# Patient Record
Sex: Female | Born: 1965
Health system: Southern US, Community
[De-identification: ages and names within clinical notes are randomized; demographics above are authoritative.]

## PROBLEM LIST (undated history)

## (undated) DIAGNOSIS — K5790 Diverticulosis of intestine, part unspecified, without perforation or abscess without bleeding: Secondary | ICD-10-CM

## (undated) DIAGNOSIS — K7460 Unspecified cirrhosis of liver: Secondary | ICD-10-CM

## (undated) DIAGNOSIS — Z9889 Other specified postprocedural states: Secondary | ICD-10-CM

## (undated) DIAGNOSIS — I700 Atherosclerosis of aorta: Secondary | ICD-10-CM

## (undated) DIAGNOSIS — G5602 Carpal tunnel syndrome, left upper limb: Secondary | ICD-10-CM

## (undated) DIAGNOSIS — D649 Anemia, unspecified: Secondary | ICD-10-CM

## (undated) DIAGNOSIS — E854 Organ-limited amyloidosis: Secondary | ICD-10-CM

## (undated) DIAGNOSIS — I209 Angina pectoris, unspecified: Secondary | ICD-10-CM

## (undated) DIAGNOSIS — K7581 Nonalcoholic steatohepatitis (NASH): Secondary | ICD-10-CM

## (undated) DIAGNOSIS — G629 Polyneuropathy, unspecified: Secondary | ICD-10-CM

## (undated) DIAGNOSIS — N3289 Other specified disorders of bladder: Secondary | ICD-10-CM

## (undated) DIAGNOSIS — N63 Unspecified lump in unspecified breast: Secondary | ICD-10-CM

## (undated) DIAGNOSIS — K859 Acute pancreatitis without necrosis or infection, unspecified: Secondary | ICD-10-CM

## (undated) DIAGNOSIS — N2889 Other specified disorders of kidney and ureter: Secondary | ICD-10-CM

## (undated) DIAGNOSIS — K769 Liver disease, unspecified: Secondary | ICD-10-CM

## (undated) DIAGNOSIS — Z789 Other specified health status: Secondary | ICD-10-CM

## (undated) DIAGNOSIS — R51 Headache: Secondary | ICD-10-CM

## (undated) DIAGNOSIS — K209 Esophagitis, unspecified without bleeding: Secondary | ICD-10-CM

## (undated) DIAGNOSIS — K8591 Acute pancreatitis with uninfected necrosis, unspecified: Secondary | ICD-10-CM

## (undated) DIAGNOSIS — R112 Nausea with vomiting, unspecified: Secondary | ICD-10-CM

## (undated) DIAGNOSIS — F32A Depression, unspecified: Secondary | ICD-10-CM

## (undated) DIAGNOSIS — I639 Cerebral infarction, unspecified: Secondary | ICD-10-CM

## (undated) DIAGNOSIS — N189 Chronic kidney disease, unspecified: Secondary | ICD-10-CM

## (undated) DIAGNOSIS — I519 Heart disease, unspecified: Secondary | ICD-10-CM

## (undated) DIAGNOSIS — G5600 Carpal tunnel syndrome, unspecified upper limb: Secondary | ICD-10-CM

## (undated) DIAGNOSIS — E079 Disorder of thyroid, unspecified: Secondary | ICD-10-CM

## (undated) DIAGNOSIS — A4902 Methicillin resistant Staphylococcus aureus infection, unspecified site: Secondary | ICD-10-CM

## (undated) DIAGNOSIS — E782 Mixed hyperlipidemia: Secondary | ICD-10-CM

## (undated) DIAGNOSIS — K861 Other chronic pancreatitis: Secondary | ICD-10-CM

## (undated) DIAGNOSIS — E119 Type 2 diabetes mellitus without complications: Secondary | ICD-10-CM

## (undated) DIAGNOSIS — I10 Essential (primary) hypertension: Secondary | ICD-10-CM

## (undated) DIAGNOSIS — S83221A Peripheral tear of medial meniscus, current injury, right knee, initial encounter: Secondary | ICD-10-CM

## (undated) DIAGNOSIS — K219 Gastro-esophageal reflux disease without esophagitis: Secondary | ICD-10-CM

## (undated) DIAGNOSIS — K589 Irritable bowel syndrome without diarrhea: Secondary | ICD-10-CM

## (undated) DIAGNOSIS — D66 Hereditary factor VIII deficiency: Secondary | ICD-10-CM

## (undated) DIAGNOSIS — G43909 Migraine, unspecified, not intractable, without status migrainosus: Secondary | ICD-10-CM

## (undated) DIAGNOSIS — E042 Nontoxic multinodular goiter: Secondary | ICD-10-CM

## (undated) DIAGNOSIS — C539 Malignant neoplasm of cervix uteri, unspecified: Secondary | ICD-10-CM

## (undated) DIAGNOSIS — D689 Coagulation defect, unspecified: Secondary | ICD-10-CM

## (undated) DIAGNOSIS — Z5189 Encounter for other specified aftercare: Secondary | ICD-10-CM

## (undated) DIAGNOSIS — F319 Bipolar disorder, unspecified: Secondary | ICD-10-CM

## (undated) DIAGNOSIS — R519 Headache, unspecified: Secondary | ICD-10-CM

## (undated) DIAGNOSIS — F329 Major depressive disorder, single episode, unspecified: Secondary | ICD-10-CM

## (undated) DIAGNOSIS — T7840XA Allergy, unspecified, initial encounter: Secondary | ICD-10-CM

## (undated) DIAGNOSIS — F419 Anxiety disorder, unspecified: Secondary | ICD-10-CM

## (undated) DIAGNOSIS — E271 Primary adrenocortical insufficiency: Secondary | ICD-10-CM

## (undated) DIAGNOSIS — N281 Cyst of kidney, acquired: Secondary | ICD-10-CM

## (undated) DIAGNOSIS — R0600 Dyspnea, unspecified: Secondary | ICD-10-CM

## (undated) HISTORY — PX: CYSTECTOMY: SUR359

## (undated) HISTORY — PX: BLADDER SURGERY: SHX569

## (undated) HISTORY — PX: LIVER BIOPSY: SHX301

## (undated) HISTORY — PX: SMALL BOWEL REPAIR: SHX6447

## (undated) HISTORY — DX: Encounter for other specified aftercare: Z51.89

## (undated) HISTORY — DX: Allergy, unspecified, initial encounter: T78.40XA

## (undated) HISTORY — DX: Anemia, unspecified: D64.9

## (undated) HISTORY — PX: ABDOMINAL HYSTERECTOMY: SHX81

## (undated) HISTORY — PX: CHOLECYSTECTOMY: SHX55

## (undated) HISTORY — PX: TONSILLECTOMY: SUR1361

## (undated) HISTORY — PX: BOWEL RESECTION: SHX1257

## (undated) HISTORY — DX: Hereditary factor VIII deficiency: D66

## (undated) HISTORY — PX: ERCP: SHX60

## (undated) HISTORY — DX: Depression, unspecified: F32.A

## (undated) HISTORY — PX: CARDIAC CATHETERIZATION: SHX172

## (undated) HISTORY — DX: Malignant neoplasm of cervix uteri, unspecified: C53.9

## (undated) HISTORY — PX: DILATATION & CURETTAGE/HYSTEROSCOPY WITH MYOSURE: SHX6511

## (undated) HISTORY — DX: Primary adrenocortical insufficiency: E27.1

## (undated) HISTORY — PX: DILATION AND CURETTAGE OF UTERUS: SHX78

## (undated) HISTORY — PX: HERNIA REPAIR: SHX51

## (undated) HISTORY — DX: Heart disease, unspecified: I51.9

## (undated) HISTORY — PX: ESOPHAGOGASTRODUODENOSCOPY: SHX1529

## (undated) HISTORY — DX: Major depressive disorder, single episode, unspecified: F32.9

## (undated) HISTORY — DX: Anxiety disorder, unspecified: F41.9

## (undated) HISTORY — PX: APPENDECTOMY: SHX54

## (undated) HISTORY — DX: Coagulation defect, unspecified: D68.9

## (undated) HISTORY — PX: COLON SURGERY: SHX602

## (undated) HISTORY — DX: Disorder of thyroid, unspecified: E07.9

---

## 1987-02-15 HISTORY — PX: BREAST BIOPSY: SHX20

## 1990-02-14 HISTORY — PX: KNEE ARTHROSCOPY: SHX127

## 1990-02-14 HISTORY — PX: CYST REMOVAL HAND: SHX6279

## 1997-02-14 DIAGNOSIS — Z5189 Encounter for other specified aftercare: Secondary | ICD-10-CM

## 1997-02-14 HISTORY — DX: Encounter for other specified aftercare: Z51.89

## 2000-02-15 DIAGNOSIS — C539 Malignant neoplasm of cervix uteri, unspecified: Secondary | ICD-10-CM

## 2000-02-15 HISTORY — DX: Malignant neoplasm of cervix uteri, unspecified: C53.9

## 2004-02-15 HISTORY — PX: PANCREAS SURGERY: SHX731

## 2010-02-14 HISTORY — PX: REPAIR OF ESOPHAGUS: SHX6062

## 2012-02-15 HISTORY — PX: ERCP: SHX60

## 2013-09-19 LAB — COMPREHENSIVE METABOLIC PANEL
Albumin: 3.5 g/dL (ref 3.4–5.0)
Alkaline Phosphatase: 150 U/L — ABNORMAL HIGH
Anion Gap: 11 (ref 7–16)
BUN: 10 mg/dL (ref 7–18)
Bilirubin,Total: 4.5 mg/dL — ABNORMAL HIGH (ref 0.2–1.0)
Calcium, Total: 7.7 mg/dL — ABNORMAL LOW (ref 8.5–10.1)
Chloride: 101 mmol/L (ref 98–107)
Co2: 27 mmol/L (ref 21–32)
Creatinine: 0.82 mg/dL (ref 0.60–1.30)
EGFR (African American): >60
EGFR (Non-African Amer.): >60
Glucose: 242 mg/dL — ABNORMAL HIGH (ref 65–99)
Osmolality: 285 (ref 275–301)
Potassium: 3.3 mmol/L — ABNORMAL LOW (ref 3.5–5.1)
SGOT(AST): 63 U/L — ABNORMAL HIGH (ref 15–37)
SGPT (ALT): 133 U/L — ABNORMAL HIGH
Sodium: 139 mmol/L (ref 136–145)
Total Protein: 6.3 g/dL — ABNORMAL LOW (ref 6.4–8.2)

## 2013-09-19 LAB — URINALYSIS, COMPLETE
Bacteria: NONE SEEN
Bilirubin,UR: NEGATIVE
Blood: NEGATIVE
Glucose,UR: 50 mg/dL (ref 0–75)
Ketone: NEGATIVE
Leukocyte Esterase: NEGATIVE
Nitrite: NEGATIVE
Ph: 6 (ref 4.5–8.0)
Protein: NEGATIVE
RBC,UR: <1 /HPF (ref 0–5)
Specific Gravity: 1.009 (ref 1.003–1.030)
Squamous Epithelial: <1
WBC UR: 1 /HPF (ref 0–5)

## 2013-09-19 LAB — CBC
HCT: 35.3 % (ref 35.0–47.0)
HGB: 12.6 g/dL (ref 12.0–16.0)
MCH: 31.9 pg (ref 26.0–34.0)
MCHC: 35.8 g/dL (ref 32.0–36.0)
MCV: 89 fL (ref 80–100)
Platelet: 116 10*3/uL — ABNORMAL LOW (ref 150–440)
RBC: 3.95 10*6/uL (ref 3.80–5.20)
RDW: 13.1 % (ref 11.5–14.5)
WBC: 5.2 10*3/uL (ref 3.6–11.0)

## 2013-09-19 LAB — LIPASE, BLOOD: Lipase: 58 U/L — ABNORMAL LOW (ref 73–393)

## 2013-09-19 LAB — TROPONIN I: Troponin-I: <0.02 ng/mL

## 2013-09-20 ENCOUNTER — Inpatient Hospital Stay: Payer: Self-pay | Admitting: Internal Medicine

## 2013-09-20 LAB — HEPATIC FUNCTION PANEL A (ARMC)
Albumin: 3.6 g/dL (ref 3.4–5.0)
Alkaline Phosphatase: 149 U/L — ABNORMAL HIGH
Bilirubin, Direct: 0.5 mg/dL — ABNORMAL HIGH (ref 0.00–0.20)
Bilirubin,Total: 5.8 mg/dL — ABNORMAL HIGH (ref 0.2–1.0)
SGOT(AST): 59 U/L — ABNORMAL HIGH (ref 15–37)
SGPT (ALT): 114 U/L — ABNORMAL HIGH
Total Protein: 6.2 g/dL — ABNORMAL LOW (ref 6.4–8.2)

## 2013-09-20 LAB — CBC WITH DIFFERENTIAL/PLATELET
Basophil #: 0 10*3/uL (ref 0.0–0.1)
Basophil %: 0.4 %
Eosinophil #: 0 10*3/uL (ref 0.0–0.7)
Eosinophil %: 0.2 %
HCT: 33.9 % — ABNORMAL LOW (ref 35.0–47.0)
HGB: 12.1 g/dL (ref 12.0–16.0)
Lymphocyte #: 1.8 10*3/uL (ref 1.0–3.6)
Lymphocyte %: 32.7 %
MCH: 32 pg (ref 26.0–34.0)
MCHC: 35.7 g/dL (ref 32.0–36.0)
MCV: 90 fL (ref 80–100)
Monocyte #: 0.3 x10 3/mm (ref 0.2–0.9)
Monocyte %: 5.7 %
Neutrophil #: 3.4 10*3/uL (ref 1.4–6.5)
Neutrophil %: 61 %
Platelet: 108 10*3/uL — ABNORMAL LOW (ref 150–440)
RBC: 3.78 10*6/uL — ABNORMAL LOW (ref 3.80–5.20)
RDW: 12.9 % (ref 11.5–14.5)
WBC: 5.7 10*3/uL (ref 3.6–11.0)

## 2013-09-20 LAB — BASIC METABOLIC PANEL
Anion Gap: 7 (ref 7–16)
BUN: 7 mg/dL (ref 7–18)
Calcium, Total: 6.6 mg/dL — CL (ref 8.5–10.1)
Chloride: 104 mmol/L (ref 98–107)
Co2: 27 mmol/L (ref 21–32)
Creatinine: 0.79 mg/dL (ref 0.60–1.30)
EGFR (African American): >60
EGFR (Non-African Amer.): >60
Glucose: 221 mg/dL — ABNORMAL HIGH (ref 65–99)
Osmolality: 280 (ref 275–301)
Potassium: 2.9 mmol/L — ABNORMAL LOW (ref 3.5–5.1)
Sodium: 138 mmol/L (ref 136–145)

## 2013-09-20 LAB — MAGNESIUM: Magnesium: 1 mg/dL — ABNORMAL LOW

## 2013-09-20 LAB — AMMONIA: Ammonia, Plasma: 50 mcmol/L — ABNORMAL HIGH (ref 11–32)

## 2013-09-20 LAB — PROTIME-INR
INR: 1.2
Prothrombin Time: 15.1 secs — ABNORMAL HIGH (ref 11.5–14.7)

## 2013-09-20 LAB — HEMOGLOBIN A1C: Hemoglobin A1C: 7.4 % — ABNORMAL HIGH (ref 4.2–6.3)

## 2013-09-21 LAB — COMPREHENSIVE METABOLIC PANEL
Albumin: 3.2 g/dL — ABNORMAL LOW (ref 3.4–5.0)
Alkaline Phosphatase: 141 U/L — ABNORMAL HIGH
Anion Gap: 5 — ABNORMAL LOW (ref 7–16)
BUN: 4 mg/dL — ABNORMAL LOW (ref 7–18)
Bilirubin,Total: 5.6 mg/dL — ABNORMAL HIGH (ref 0.2–1.0)
Calcium, Total: 7.2 mg/dL — ABNORMAL LOW (ref 8.5–10.1)
Chloride: 103 mmol/L (ref 98–107)
Co2: 30 mmol/L (ref 21–32)
Creatinine: 0.74 mg/dL (ref 0.60–1.30)
EGFR (African American): >60
EGFR (Non-African Amer.): >60
Glucose: 148 mg/dL — ABNORMAL HIGH (ref 65–99)
Osmolality: 275 (ref 275–301)
Potassium: 3.4 mmol/L — ABNORMAL LOW (ref 3.5–5.1)
SGOT(AST): 55 U/L — ABNORMAL HIGH (ref 15–37)
SGPT (ALT): 101 U/L — ABNORMAL HIGH
Sodium: 138 mmol/L (ref 136–145)
Total Protein: 5.7 g/dL — ABNORMAL LOW (ref 6.4–8.2)

## 2013-09-21 LAB — CBC WITH DIFFERENTIAL/PLATELET
Basophil #: 0 10*3/uL (ref 0.0–0.1)
Basophil %: 0.3 %
Eosinophil #: 0.1 10*3/uL (ref 0.0–0.7)
Eosinophil %: 1.2 %
HCT: 32.7 % — ABNORMAL LOW (ref 35.0–47.0)
HGB: 11.6 g/dL — ABNORMAL LOW (ref 12.0–16.0)
Lymphocyte #: 2 10*3/uL (ref 1.0–3.6)
Lymphocyte %: 37.3 %
MCH: 31.6 pg (ref 26.0–34.0)
MCHC: 35.5 g/dL (ref 32.0–36.0)
MCV: 89 fL (ref 80–100)
Monocyte #: 0.3 x10 3/mm (ref 0.2–0.9)
Monocyte %: 5.2 %
Neutrophil #: 3 10*3/uL (ref 1.4–6.5)
Neutrophil %: 56 %
Platelet: 104 10*3/uL — ABNORMAL LOW (ref 150–440)
RBC: 3.67 10*6/uL — ABNORMAL LOW (ref 3.80–5.20)
RDW: 13.2 % (ref 11.5–14.5)
WBC: 5.4 10*3/uL (ref 3.6–11.0)

## 2013-09-21 LAB — URINALYSIS, COMPLETE
Bacteria: NONE SEEN
Bilirubin,UR: NEGATIVE
Blood: NEGATIVE
Glucose,UR: NEGATIVE mg/dL (ref 0–75)
Leukocyte Esterase: NEGATIVE
Nitrite: NEGATIVE
Ph: 6 (ref 4.5–8.0)
Protein: NEGATIVE
RBC,UR: NONE SEEN /HPF (ref 0–5)
Specific Gravity: 1.009 (ref 1.003–1.030)
Squamous Epithelial: <1
WBC UR: 1 /HPF (ref 0–5)

## 2013-09-21 LAB — LIPASE, BLOOD: Lipase: 34 U/L — ABNORMAL LOW (ref 73–393)

## 2013-09-21 LAB — MAGNESIUM: Magnesium: 2 mg/dL

## 2013-09-22 LAB — CBC WITH DIFFERENTIAL/PLATELET
Basophil #: 0 10*3/uL (ref 0.0–0.1)
Basophil %: 0.6 %
Eosinophil #: 0.1 10*3/uL (ref 0.0–0.7)
Eosinophil %: 1.4 %
HCT: 34.3 % — ABNORMAL LOW (ref 35.0–47.0)
HGB: 12 g/dL (ref 12.0–16.0)
Lymphocyte #: 1.7 10*3/uL (ref 1.0–3.6)
Lymphocyte %: 38.2 %
MCH: 31.7 pg (ref 26.0–34.0)
MCHC: 35 g/dL (ref 32.0–36.0)
MCV: 91 fL (ref 80–100)
Monocyte #: 0.3 x10 3/mm (ref 0.2–0.9)
Monocyte %: 5.8 %
Neutrophil #: 2.4 10*3/uL (ref 1.4–6.5)
Neutrophil %: 54 %
Platelet: 111 10*3/uL — ABNORMAL LOW (ref 150–440)
RBC: 3.79 10*6/uL — ABNORMAL LOW (ref 3.80–5.20)
RDW: 13.1 % (ref 11.5–14.5)
WBC: 4.4 10*3/uL (ref 3.6–11.0)

## 2013-09-22 LAB — COMPREHENSIVE METABOLIC PANEL
Albumin: 3.3 g/dL — ABNORMAL LOW (ref 3.4–5.0)
Alkaline Phosphatase: 166 U/L — ABNORMAL HIGH
Anion Gap: 8 (ref 7–16)
BUN: 4 mg/dL — ABNORMAL LOW (ref 7–18)
Bilirubin,Total: 5.2 mg/dL — ABNORMAL HIGH (ref 0.2–1.0)
Calcium, Total: 7.9 mg/dL — ABNORMAL LOW (ref 8.5–10.1)
Chloride: 105 mmol/L (ref 98–107)
Co2: 26 mmol/L (ref 21–32)
Creatinine: 0.76 mg/dL (ref 0.60–1.30)
EGFR (African American): >60
EGFR (Non-African Amer.): >60
Glucose: 198 mg/dL — ABNORMAL HIGH (ref 65–99)
Osmolality: 280 (ref 275–301)
Potassium: 4.5 mmol/L (ref 3.5–5.1)
SGOT(AST): 238 U/L — ABNORMAL HIGH (ref 15–37)
SGPT (ALT): 209 U/L — ABNORMAL HIGH
Sodium: 139 mmol/L (ref 136–145)
Total Protein: 5.7 g/dL — ABNORMAL LOW (ref 6.4–8.2)

## 2013-09-22 LAB — LIPASE, BLOOD: Lipase: 33 U/L — ABNORMAL LOW (ref 73–393)

## 2013-09-23 LAB — HEPATIC FUNCTION PANEL A (ARMC)
Albumin: 3.2 g/dL — ABNORMAL LOW (ref 3.4–5.0)
Alkaline Phosphatase: 173 U/L — ABNORMAL HIGH
Bilirubin, Direct: 0.6 mg/dL — ABNORMAL HIGH (ref 0.00–0.20)
Bilirubin,Total: 6.4 mg/dL — ABNORMAL HIGH (ref 0.2–1.0)
SGOT(AST): 130 U/L — ABNORMAL HIGH (ref 15–37)
SGPT (ALT): 208 U/L — ABNORMAL HIGH
Total Protein: 6.2 g/dL — ABNORMAL LOW (ref 6.4–8.2)

## 2013-09-23 LAB — CBC WITH DIFFERENTIAL/PLATELET
Basophil #: 0 10*3/uL (ref 0.0–0.1)
Basophil %: 0.3 %
Eosinophil #: 0.1 10*3/uL (ref 0.0–0.7)
Eosinophil %: 1.2 %
HCT: 37.4 % (ref 35.0–47.0)
HGB: 13.2 g/dL (ref 12.0–16.0)
Lymphocyte #: 1.8 10*3/uL (ref 1.0–3.6)
Lymphocyte %: 34.2 %
MCH: 31.9 pg (ref 26.0–34.0)
MCHC: 35.1 g/dL (ref 32.0–36.0)
MCV: 91 fL (ref 80–100)
Monocyte #: 0.3 x10 3/mm (ref 0.2–0.9)
Monocyte %: 5.5 %
Neutrophil #: 3 10*3/uL (ref 1.4–6.5)
Neutrophil %: 58.8 %
Platelet: 110 10*3/uL — ABNORMAL LOW (ref 150–440)
RBC: 4.12 10*6/uL (ref 3.80–5.20)
RDW: 13.1 % (ref 11.5–14.5)
WBC: 5.1 10*3/uL (ref 3.6–11.0)

## 2013-09-23 LAB — BASIC METABOLIC PANEL
Anion Gap: 8 (ref 7–16)
BUN: 1 mg/dL — ABNORMAL LOW (ref 7–18)
Calcium, Total: 7.8 mg/dL — ABNORMAL LOW (ref 8.5–10.1)
Chloride: 99 mmol/L (ref 98–107)
Co2: 33 mmol/L — ABNORMAL HIGH (ref 21–32)
Creatinine: 0.84 mg/dL (ref 0.60–1.30)
EGFR (African American): >60
EGFR (Non-African Amer.): >60
Glucose: 157 mg/dL — ABNORMAL HIGH (ref 65–99)
Osmolality: 278 (ref 275–301)
Potassium: 3.2 mmol/L — ABNORMAL LOW (ref 3.5–5.1)
Sodium: 140 mmol/L (ref 136–145)

## 2013-09-23 LAB — PROTIME-INR
INR: 1.2
Prothrombin Time: 14.8 secs — ABNORMAL HIGH (ref 11.5–14.7)

## 2013-09-24 LAB — BASIC METABOLIC PANEL
Anion Gap: 7 (ref 7–16)
BUN: 1 mg/dL — ABNORMAL LOW (ref 7–18)
Calcium, Total: 7.7 mg/dL — ABNORMAL LOW (ref 8.5–10.1)
Chloride: 96 mmol/L — ABNORMAL LOW (ref 98–107)
Co2: 35 mmol/L — ABNORMAL HIGH (ref 21–32)
Creatinine: 0.76 mg/dL (ref 0.60–1.30)
EGFR (African American): >60
EGFR (Non-African Amer.): >60
Glucose: 215 mg/dL — ABNORMAL HIGH (ref 65–99)
Osmolality: 278 (ref 275–301)
Potassium: 2.9 mmol/L — ABNORMAL LOW (ref 3.5–5.1)
Sodium: 138 mmol/L (ref 136–145)

## 2013-09-24 LAB — CBC WITH DIFFERENTIAL/PLATELET
Basophil #: 0 10*3/uL (ref 0.0–0.1)
Basophil %: 0.4 %
Eosinophil #: 0.1 10*3/uL (ref 0.0–0.7)
Eosinophil %: 1.2 %
HCT: 37.2 % (ref 35.0–47.0)
HGB: 13.1 g/dL (ref 12.0–16.0)
Lymphocyte #: 1.4 10*3/uL (ref 1.0–3.6)
Lymphocyte %: 31.3 %
MCH: 31.6 pg (ref 26.0–34.0)
MCHC: 35.1 g/dL (ref 32.0–36.0)
MCV: 90 fL (ref 80–100)
Monocyte #: 0.3 x10 3/mm (ref 0.2–0.9)
Monocyte %: 7 %
Neutrophil #: 2.7 10*3/uL (ref 1.4–6.5)
Neutrophil %: 60.1 %
Platelet: 107 10*3/uL — ABNORMAL LOW (ref 150–440)
RBC: 4.14 10*6/uL (ref 3.80–5.20)
RDW: 13.1 % (ref 11.5–14.5)
WBC: 4.4 10*3/uL (ref 3.6–11.0)

## 2013-09-24 LAB — HEPATIC FUNCTION PANEL A (ARMC)
Albumin: 3.2 g/dL — ABNORMAL LOW (ref 3.4–5.0)
Alkaline Phosphatase: 155 U/L — ABNORMAL HIGH
Bilirubin, Direct: 0.4 mg/dL — ABNORMAL HIGH (ref 0.00–0.20)
Bilirubin,Total: 5.7 mg/dL — ABNORMAL HIGH (ref 0.2–1.0)
SGOT(AST): 49 U/L — ABNORMAL HIGH (ref 15–37)
SGPT (ALT): 133 U/L — ABNORMAL HIGH
Total Protein: 6 g/dL — ABNORMAL LOW (ref 6.4–8.2)

## 2013-09-24 LAB — PROTIME-INR
INR: 1.1
Prothrombin Time: 14.3 secs (ref 11.5–14.7)

## 2013-09-25 HISTORY — PX: ESOPHAGOGASTRODUODENOSCOPY: SHX1529

## 2013-09-25 LAB — BASIC METABOLIC PANEL
Anion Gap: 9 (ref 7–16)
BUN: 2 mg/dL — ABNORMAL LOW (ref 7–18)
Calcium, Total: 7.8 mg/dL — ABNORMAL LOW (ref 8.5–10.1)
Chloride: 99 mmol/L (ref 98–107)
Co2: 31 mmol/L (ref 21–32)
Creatinine: 0.82 mg/dL (ref 0.60–1.30)
EGFR (African American): >60
EGFR (Non-African Amer.): >60
Glucose: 162 mg/dL — ABNORMAL HIGH (ref 65–99)
Osmolality: 277 (ref 275–301)
Potassium: 3.4 mmol/L — ABNORMAL LOW (ref 3.5–5.1)
Sodium: 139 mmol/L (ref 136–145)

## 2013-09-25 LAB — HEPATIC FUNCTION PANEL A (ARMC)
Albumin: 3 g/dL — ABNORMAL LOW (ref 3.4–5.0)
Alkaline Phosphatase: 145 U/L — ABNORMAL HIGH
Bilirubin, Direct: 0.5 mg/dL — ABNORMAL HIGH (ref 0.00–0.20)
Bilirubin,Total: 5.7 mg/dL — ABNORMAL HIGH (ref 0.2–1.0)
SGOT(AST): 56 U/L — ABNORMAL HIGH (ref 15–37)
SGPT (ALT): 105 U/L — ABNORMAL HIGH
Total Protein: 6 g/dL — ABNORMAL LOW (ref 6.4–8.2)

## 2013-09-25 LAB — CBC WITH DIFFERENTIAL/PLATELET
Basophil #: 0 10*3/uL (ref 0.0–0.1)
Basophil %: 0.3 %
Eosinophil #: 0.1 10*3/uL (ref 0.0–0.7)
Eosinophil %: 1.2 %
HCT: 37.1 % (ref 35.0–47.0)
HGB: 12.7 g/dL (ref 12.0–16.0)
Lymphocyte #: 2 10*3/uL (ref 1.0–3.6)
Lymphocyte %: 41.8 %
MCH: 31 pg (ref 26.0–34.0)
MCHC: 34.2 g/dL (ref 32.0–36.0)
MCV: 91 fL (ref 80–100)
Monocyte #: 0.4 x10 3/mm (ref 0.2–0.9)
Monocyte %: 7.8 %
Neutrophil #: 2.3 10*3/uL (ref 1.4–6.5)
Neutrophil %: 48.9 %
Platelet: 110 10*3/uL — ABNORMAL LOW (ref 150–440)
RBC: 4.1 10*6/uL (ref 3.80–5.20)
RDW: 12.9 % (ref 11.5–14.5)
WBC: 4.8 10*3/uL (ref 3.6–11.0)

## 2013-09-26 LAB — CBC WITH DIFFERENTIAL/PLATELET
Basophil #: 0 10*3/uL (ref 0.0–0.1)
Basophil %: 0.5 %
Eosinophil #: 0.1 10*3/uL (ref 0.0–0.7)
Eosinophil %: 1.5 %
HCT: 45.5 % (ref 35.0–47.0)
HGB: 15.3 g/dL (ref 12.0–16.0)
Lymphocyte #: 3.8 10*3/uL — ABNORMAL HIGH (ref 1.0–3.6)
Lymphocyte %: 43.9 %
MCH: 30.9 pg (ref 26.0–34.0)
MCHC: 33.7 g/dL (ref 32.0–36.0)
MCV: 92 fL (ref 80–100)
Monocyte #: 0.6 x10 3/mm (ref 0.2–0.9)
Monocyte %: 6.6 %
Neutrophil #: 4.1 10*3/uL (ref 1.4–6.5)
Neutrophil %: 47.5 %
Platelet: 226 10*3/uL (ref 150–440)
RBC: 4.96 10*6/uL (ref 3.80–5.20)
RDW: 13 % (ref 11.5–14.5)
WBC: 8.7 10*3/uL (ref 3.6–11.0)

## 2013-09-27 LAB — URINALYSIS, COMPLETE
Bilirubin,UR: NEGATIVE
Glucose,UR: NEGATIVE mg/dL (ref 0–75)
Hyaline Cast: 5
Ketone: NEGATIVE
Nitrite: NEGATIVE
Ph: 5 (ref 4.5–8.0)
Protein: NEGATIVE
RBC,UR: 1 /HPF (ref 0–5)
Specific Gravity: 1.011 (ref 1.003–1.030)
Squamous Epithelial: 4
WBC UR: 1 /HPF (ref 0–5)

## 2013-11-28 ENCOUNTER — Ambulatory Visit: Payer: Self-pay | Admitting: Gastroenterology

## 2013-11-28 LAB — HEPATIC FUNCTION PANEL A (ARMC)
Albumin: 3.9 g/dL (ref 3.4–5.0)
Alkaline Phosphatase: 123 U/L — ABNORMAL HIGH
Bilirubin, Direct: 0.2 mg/dL (ref 0.00–0.20)
Bilirubin,Total: 5.5 mg/dL — ABNORMAL HIGH (ref 0.2–1.0)
SGOT(AST): 33 U/L (ref 15–37)
SGPT (ALT): 46 U/L
Total Protein: 7.1 g/dL (ref 6.4–8.2)

## 2013-11-28 LAB — CBC WITH DIFFERENTIAL/PLATELET
Basophil #: 0 10*3/uL (ref 0.0–0.1)
Basophil %: 0.4 %
Eosinophil #: 0 10*3/uL (ref 0.0–0.7)
Eosinophil %: 0.7 %
HCT: 39.5 % (ref 35.0–47.0)
HGB: 12.9 g/dL (ref 12.0–16.0)
Lymphocyte #: 2.1 10*3/uL (ref 1.0–3.6)
Lymphocyte %: 41.1 %
MCH: 29.6 pg (ref 26.0–34.0)
MCHC: 32.7 g/dL (ref 32.0–36.0)
MCV: 90 fL (ref 80–100)
Monocyte #: 0.2 x10 3/mm (ref 0.2–0.9)
Monocyte %: 4.1 %
Neutrophil #: 2.8 10*3/uL (ref 1.4–6.5)
Neutrophil %: 53.7 %
Platelet: 147 10*3/uL — ABNORMAL LOW (ref 150–440)
RBC: 4.37 10*6/uL (ref 3.80–5.20)
RDW: 13.2 % (ref 11.5–14.5)
WBC: 5.2 10*3/uL (ref 3.6–11.0)

## 2013-11-28 LAB — PROTIME-INR
INR: 1.1
Prothrombin Time: 13.8 secs (ref 11.5–14.7)

## 2013-11-28 LAB — AMMONIA: Ammonia, Plasma: 34 mcmol/L — ABNORMAL HIGH (ref 11–32)

## 2013-12-03 ENCOUNTER — Observation Stay: Payer: Self-pay | Admitting: Internal Medicine

## 2013-12-03 LAB — COMPREHENSIVE METABOLIC PANEL
Albumin: 3.5 g/dL (ref 3.4–5.0)
Alkaline Phosphatase: 117 U/L — ABNORMAL HIGH
Anion Gap: 10 (ref 7–16)
BUN: 11 mg/dL (ref 7–18)
Bilirubin,Total: 5 mg/dL — ABNORMAL HIGH (ref 0.2–1.0)
Calcium, Total: 7.6 mg/dL — ABNORMAL LOW (ref 8.5–10.1)
Chloride: 105 mmol/L (ref 98–107)
Co2: 27 mmol/L (ref 21–32)
Creatinine: 0.85 mg/dL (ref 0.60–1.30)
EGFR (African American): >60
EGFR (Non-African Amer.): >60
Glucose: 296 mg/dL — ABNORMAL HIGH (ref 65–99)
Osmolality: 293 (ref 275–301)
Potassium: 3.5 mmol/L (ref 3.5–5.1)
SGOT(AST): 31 U/L (ref 15–37)
SGPT (ALT): 43 U/L
Sodium: 142 mmol/L (ref 136–145)
Total Protein: 6.3 g/dL — ABNORMAL LOW (ref 6.4–8.2)

## 2013-12-03 LAB — URINALYSIS, COMPLETE
Bilirubin,UR: NEGATIVE
Blood: NEGATIVE
Glucose,UR: >=500 mg/dL (ref 0–75)
Ketone: NEGATIVE
Leukocyte Esterase: NEGATIVE
Nitrite: NEGATIVE
Ph: 6 (ref 4.5–8.0)
Protein: NEGATIVE
RBC,UR: <1 /HPF (ref 0–5)
Specific Gravity: 1.005 (ref 1.003–1.030)
Squamous Epithelial: 1
WBC UR: <1 /HPF (ref 0–5)

## 2013-12-03 LAB — CBC
HCT: 36.4 % (ref 35.0–47.0)
HGB: 12.7 g/dL (ref 12.0–16.0)
MCH: 31 pg (ref 26.0–34.0)
MCHC: 34.8 g/dL (ref 32.0–36.0)
MCV: 89 fL (ref 80–100)
Platelet: 141 10*3/uL — ABNORMAL LOW (ref 150–440)
RBC: 4.09 10*6/uL (ref 3.80–5.20)
RDW: 13.2 % (ref 11.5–14.5)
WBC: 4.5 10*3/uL (ref 3.6–11.0)

## 2013-12-03 LAB — APTT: Activated PTT: 30.2 secs (ref 23.6–35.9)

## 2013-12-03 LAB — TROPONIN I
Troponin-I: <0.02 ng/mL
Troponin-I: <0.02 ng/mL
Troponin-I: <0.02 ng/mL

## 2013-12-03 LAB — PROTIME-INR
INR: 1.1
Prothrombin Time: 14.3 secs (ref 11.5–14.7)

## 2013-12-03 LAB — LIPASE, BLOOD: Lipase: 25 U/L — ABNORMAL LOW (ref 73–393)

## 2013-12-04 LAB — CBC WITH DIFFERENTIAL/PLATELET
Basophil #: 0 10*3/uL (ref 0.0–0.1)
Basophil %: 0.4 %
Eosinophil #: 0 10*3/uL (ref 0.0–0.7)
Eosinophil %: 0.3 %
HCT: 35.8 % (ref 35.0–47.0)
HGB: 12.4 g/dL (ref 12.0–16.0)
Lymphocyte #: 2.3 10*3/uL (ref 1.0–3.6)
Lymphocyte %: 32.9 %
MCH: 30.6 pg (ref 26.0–34.0)
MCHC: 34.7 g/dL (ref 32.0–36.0)
MCV: 88 fL (ref 80–100)
Monocyte #: 0.4 x10 3/mm (ref 0.2–0.9)
Monocyte %: 5.1 %
Neutrophil #: 4.3 10*3/uL (ref 1.4–6.5)
Neutrophil %: 61.3 %
Platelet: 154 10*3/uL (ref 150–440)
RBC: 4.06 10*6/uL (ref 3.80–5.20)
RDW: 13 % (ref 11.5–14.5)
WBC: 7 10*3/uL (ref 3.6–11.0)

## 2013-12-04 LAB — BASIC METABOLIC PANEL
Anion Gap: 8 (ref 7–16)
BUN: 6 mg/dL — ABNORMAL LOW (ref 7–18)
Calcium, Total: 7.3 mg/dL — ABNORMAL LOW (ref 8.5–10.1)
Chloride: 102 mmol/L (ref 98–107)
Co2: 29 mmol/L (ref 21–32)
Creatinine: 0.63 mg/dL (ref 0.60–1.30)
EGFR (African American): >60
EGFR (Non-African Amer.): >60
Glucose: 135 mg/dL — ABNORMAL HIGH (ref 65–99)
Osmolality: 277 (ref 275–301)
Potassium: 3.2 mmol/L — ABNORMAL LOW (ref 3.5–5.1)
Sodium: 139 mmol/L (ref 136–145)

## 2013-12-04 LAB — HEMOGLOBIN A1C: Hemoglobin A1C: 7.2 % — ABNORMAL HIGH (ref 4.2–6.3)

## 2013-12-04 LAB — MAGNESIUM: Magnesium: 1.4 mg/dL — ABNORMAL LOW

## 2013-12-06 LAB — BASIC METABOLIC PANEL
Anion Gap: 6 — ABNORMAL LOW (ref 7–16)
BUN: 12 mg/dL (ref 7–18)
Calcium, Total: 7.2 mg/dL — ABNORMAL LOW (ref 8.5–10.1)
Chloride: 101 mmol/L (ref 98–107)
Co2: 32 mmol/L (ref 21–32)
Creatinine: 0.8 mg/dL (ref 0.60–1.30)
EGFR (African American): >60
EGFR (Non-African Amer.): >60
Glucose: 265 mg/dL — ABNORMAL HIGH (ref 65–99)
Osmolality: 287 (ref 275–301)
Potassium: 3.3 mmol/L — ABNORMAL LOW (ref 3.5–5.1)
Sodium: 139 mmol/L (ref 136–145)

## 2013-12-06 LAB — MAGNESIUM: Magnesium: 2.8 mg/dL — ABNORMAL HIGH

## 2014-01-31 ENCOUNTER — Emergency Department: Payer: Self-pay | Admitting: Emergency Medicine

## 2014-01-31 LAB — COMPREHENSIVE METABOLIC PANEL
Albumin: 4 g/dL (ref 3.4–5.0)
Alkaline Phosphatase: 151 U/L — ABNORMAL HIGH
Anion Gap: 11 (ref 7–16)
BUN: 10 mg/dL (ref 7–18)
Bilirubin,Total: 7.5 mg/dL — ABNORMAL HIGH (ref 0.2–1.0)
Calcium, Total: 7.8 mg/dL — ABNORMAL LOW (ref 8.5–10.1)
Chloride: 102 mmol/L (ref 98–107)
Co2: 25 mmol/L (ref 21–32)
Creatinine: 0.72 mg/dL (ref 0.60–1.30)
EGFR (African American): >60
EGFR (Non-African Amer.): >60
Glucose: 276 mg/dL — ABNORMAL HIGH (ref 65–99)
Osmolality: 285 (ref 275–301)
Potassium: 2.8 mmol/L — ABNORMAL LOW (ref 3.5–5.1)
SGOT(AST): 32 U/L (ref 15–37)
SGPT (ALT): 58 U/L
Sodium: 138 mmol/L (ref 136–145)
Total Protein: 6.9 g/dL (ref 6.4–8.2)

## 2014-01-31 LAB — URINALYSIS, COMPLETE
Bilirubin,UR: NEGATIVE
Blood: NEGATIVE
Glucose,UR: >=500 mg/dL (ref 0–75)
Hyaline Cast: 5
Leukocyte Esterase: NEGATIVE
Nitrite: NEGATIVE
Ph: 5 (ref 4.5–8.0)
Protein: NEGATIVE
RBC,UR: 1 /HPF (ref 0–5)
Specific Gravity: 1.018 (ref 1.003–1.030)
Squamous Epithelial: 3
WBC UR: 4 /HPF (ref 0–5)

## 2014-01-31 LAB — CBC WITH DIFFERENTIAL/PLATELET
Basophil #: 0 10*3/uL (ref 0.0–0.1)
Basophil %: 0.4 %
Eosinophil #: 0.1 10*3/uL (ref 0.0–0.7)
Eosinophil %: 0.6 %
HCT: 39.5 % (ref 35.0–47.0)
HGB: 13.8 g/dL (ref 12.0–16.0)
Lymphocyte #: 3.5 10*3/uL (ref 1.0–3.6)
Lymphocyte %: 33.7 %
MCH: 30.2 pg (ref 26.0–34.0)
MCHC: 34.9 g/dL (ref 32.0–36.0)
MCV: 87 fL (ref 80–100)
Monocyte #: 0.5 x10 3/mm (ref 0.2–0.9)
Monocyte %: 4.5 %
Neutrophil #: 6.2 10*3/uL (ref 1.4–6.5)
Neutrophil %: 60.8 %
Platelet: 180 10*3/uL (ref 150–440)
RBC: 4.56 10*6/uL (ref 3.80–5.20)
RDW: 13.3 % (ref 11.5–14.5)
WBC: 10.3 10*3/uL (ref 3.6–11.0)

## 2014-01-31 LAB — LIPASE, BLOOD: Lipase: 58 U/L — ABNORMAL LOW (ref 73–393)

## 2014-02-10 ENCOUNTER — Ambulatory Visit: Payer: Self-pay | Admitting: Gastroenterology

## 2014-04-20 DIAGNOSIS — Z794 Long term (current) use of insulin: Secondary | ICD-10-CM | POA: Insufficient documentation

## 2014-06-07 NOTE — Consult Note (Signed)
PATIENT NAME:  Amber Christensen, Amber Christensen MR#:  [id number] DATE OF BIRTH:  [birth year]  DATE OF CONSULTATION:  12/03/2013  REFERRING PHYSICIAN:  Dr. Bridgett Larsson CONSULTING PHYSICIAN:  Gaylyn Cheers, MD/Pammie Chirino A. Jerelene Redden, ANP (Adult Nurse Practitioner)  REASON FOR CONSULTATION: Abdominal pain.   HISTORY OF PRESENT ILLNESS: This 49 year old patient presented to the Emergency Room today with acute on chronic abdominal pain. The patient reports last night she developed diffuse mild abdominal pain generalized in the abdomen. During the day today, the pain escalated and she had her first episode of vomiting in the Emergency Room. She has had vomiting several times since. She has required Zofran and Phenergan, as well as Dilaudid. She continues to have problems with diffuse abdominal pain and vomiting. She reports these symptoms are identical to what she has experienced in the past. The patient has noted no hematemesis, coffee-ground emesis or melena. Bowel habits are variable from formed to loose, which is her baseline. The patient cannot recall any specific triggers that elicited this current event. She says she has been taking her Protonix daily. She is no longer on Carafate. She reports she is taking her Creon as directed with meals for known pancreatic insufficiency. The patient denies any NSAID use.   This patient has a complex medical history with recurrent hospitalizations for abdominal pain, nausea, and vomiting. She developed an ileus in August, did require NG tube drainage. An upper endoscopy was performed and showed LA grade C erosive esophagitis. She has had elevated liver enzymes in the past with elevated bilirubin thought to be in part to Gilbert's. She has seen Dr. Monica Martinez at [facility] and is deemed at this point not to be a transplant candidate. There has been no evidence of cirrhosis. Her prior liver biopsies from [place] have been requested for a review. The patient does have a history of severe fatty liver,  fatty replaced pancreas, as well as pancreatitis with multiple ERCPs. She has had pancreas resection. I refer you to previous admission notes. She has also had numerous liver studies obtained and at this point, there has been no evidence of active hepatitis B or hepatitis C. The patient has had numerous abdominal surgeries.   Admitting laboratory studies revealed a lipase of 25, alkaline phosphatase 117, ALT 43, total bilirubin 5.0, which seems to be her baseline, normal troponins, and platelet count 141,000, and INR 1.1. Dr. Gustavo Lah is following her as an outpatient and saw her recently last week. The patient says she was scheduled for an MRI or a MRCP this weekend.   PAST MEDICAL HISTORY: 1.  Recurrent nonalcoholic pancreatitis, started at age 76.  2.  History of partial pancreatic resection in 2014.  3.  Insulin-dependent diabetes mellitus.  4.  Hypertension.  5.  History of cervical cancer.  6.  Depression.  7.  Has history of hepatitis.  8.  Hypertension.  9.  NASH, nonalcoholic steatohepatitis.  10.   Irritable bowel syndrome.  11.   Bipolar disorder.    PAST SURGICAL HISTORY: 1.  Partial pancreectomy 2014.  2.  Left upper cholecystectomy.  3.  Appendectomy.  4.  Hysterectomy, vaginal.  5.  Tonsillectomy.  6.  Bladder repair.  7.  Multiple liver biopsies.  8.  Multiple cystoscopies.  9.  Cardiac catheterization.  10.   D and C.  11.   Bowel repair.  12.   Adhesion removals.   FAMILY HISTORY: Positive for colon cancer in mother, cirrhosis in father, cirrhosis in paternal grandmother.   ALLERGIES:  CYCLOBENZAPRINE, DEMEROL HCL, LEVAQUIN, MAGNESIUM OXIDE, MORPHINE, ZOFRAN, PROPOXYPHENE, SOMA.   HOME MEDICATIONS: 1.  Protonix 40 mg daily.  2.  Oxycodone 5 mg every 6 hours as needed.  3.  Zofran 4 mg every 8 hours as needed.  4.  Meclizine 12.5 mg 3 times daily.  5.  Lisinopril 10 mg daily.  6.  Lantus 6 units subcutaneously daily at bedtime. 7.  Humalog 3 times daily as  needed according to sliding scale.  8.  Creon 36,000 units 2 capsules 3 times daily.  9.  Amlodipine 5 mg once daily.   REVIEW OF SYSTEMS: Positive for some fatigue, feels bad all over, nausea, vomiting, abdominal pain as noted. The patient denies remaining 10 systems.   PHYSICAL EXAMINATION: VITAL SIGNS: 98, 75, 18, 160/93. Pulse oximetry on room air is 100%.  GENERAL: Caucasian female with jaundice, resting in bed. The patient is conversive. She then begins having vomiting and vomits a moderate amount of gray-green liquid.  HEENT: Head is normocephalic. Conjunctivae is injected. Positive scleral icterus. Oral mucosa is dry and intact.  NECK: Supple. Trachea midline.  CARDIAC: S1 and S2 without murmur, rub or gallop.  LUNGS: CTA. Respirations are eupneic.  ABDOMEN: Soft, multiple abdominal scars noted. She has tenderness wherever palpated, mostly on the right side. Nondistended. Bowel sounds are present.  RECTAL: Deferred.  GENITOURINARY: Bladder is nondistended.  EXTREMITIES: Lower extremities without edema, cyanosis, or clubbing. A few bruises are noted.  SKIN: Warm and dry.  NEUROLOGIC: She is alert, oriented. Vague historian.  PSYCHIATRIC: Affect and mood somewhat flat, cooperative.   LABORATORY: Admitting labs with glucose 296, BUN 11, creatinine 0.85, electrolytes unremarkable, lipase 25, albumin 3.5, total bilirubin 5.0, alkaline phosphatase 117, ALT 43, AST 31. Troponin negative x 3. WBC 4.5, hemoglobin 12.7, platelet 141,000, MCV 89. Pro time 14.3, INR 1.1, PTT 30.2. Urinalysis is unremarkable.   RADIOLOGY: CT of the abdomen and pelvis with contrast performed today shows no acute abdominal or pelvic findings. Stable postsurgical changes. There is minimal residual pancreatic tissue. Improved hepatic steatosis from last study. Probable chronic splenic vein thrombosis with splenic varices. Chest x-ray, portable, single view, shows no acute pulmonary findings.   IMPRESSION: This is a  complex patient with history of liver disease and pancreatic disease. She does have abdominal pain, nausea, vomiting consistent with a prior history. Recent EGD in August with LA grade C esophagitis. The patient has risk factors for adhesions again. Most likely explanation for current presentation is adhesions. She is at risk for partial obstruction and ileus. She is at risk for flares of esophagitis and/or gastritis.   PLAN: Continue with proton pump inhibitor. Resume Carafate slurry when the vomiting is better managed. Symptomatic management with daily abdominal films for at least 2 to 3 days. IV fluids and close monitoring of blood sugars as patient is not able to tolerate p.o. liquids at this time. Continue with medication for nausea, vomiting, and pain. Clinical course will need to be monitored closely. No GI luminal evaluation recommended at this time. This case was discussed with Dr. Vira Agar and all labs and films were reviewed. Further GI followup pending.   Thank you for this consultation.   These services provided by Joelene Millin A. Jerelene Redden, MS, APRN, BC, ANP (Adult Nurse Practitioner) under collaborative agreement with Manya Silvas, MD.     ____________________________ Janalyn Harder Jerelene Redden, ANP (Adult Nurse Practitioner) kam:at D: 12/03/2013 17:43:22 ET T: 12/03/2013 18:16:15 ET JOB#: [id number]  cc: Joelene Millin A. Jerelene Redden, ANP (Adult  Nurse Practitioner), <Dictator> Janalyn Harder. Sherlyn Hay, MSN, ANP-BC Adult Nurse Practitioner ELECTRONICALLY SIGNED 12/05/2013 8:17

## 2014-06-07 NOTE — Consult Note (Signed)
Brief Consult Note: Diagnosis: abdominal pain, back pain, nausea, vomiting, colonic distention.   Patient was seen by consultant.   Consult note dictated.   Comments: No confirmatory signs of cirrhosis. Getting the results of her liver Bx might help. She does have a fatty liver, a bili of 5, normal synthetic hepatic function, normal portal vein flow, no ascites, so cirrhosis is unlikely. Her CBD is 7 mm which is a little large. She may have some obstructive component to her jaundice or also have some type of bilirubin metabolism problem. 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 Also, she has marked gaseous distention of her colon on XRay, c/w opioid-induced colonic ileus. She has had 7 mg IV Dilaudid in last 24 hours, and (just prior to a dose) my exam shows mild abdominal distention, and although she expresses that it hurts, no objective signs of tenderness (no rebound, no guarding). Her baseline is diarrhea, with floating stools, c/w inadequately treated pancreatic insufficiency. Perhaps a selective mu receptor antagonist, such as Relistor, while she has an NG and is receiving IV opioids, may help with her colon function.  She does not have a surgical problem at this time.  Her CT scan of the abdomen and pelvis, with IV and oral contrast,showed most of her pancreas was removed with her pancreatic necrosectomy 05/2012, and also shows signs of splenic vein thrombosis (which is born out by her normal PV flow, and her mild thrombocytopenia)..  Electronic Signatures: Consuela Mimes (MD)  (Signed 09-Aug-15 20:38)  Authored: Brief Consult Note   Last Updated: 09-Aug-15 20:38 by Consuela Mimes (MD)

## 2014-06-07 NOTE — Consult Note (Signed)
Pt with hx of multiple complex medical/surgical problems with surgery all done in [place].  Seen 2 months ago by Dr. Gustavo Lah and at [facility] by Dr. Jola Babinski.  She is tender in mid LLQ and has palpable distention.  CT noted.  I wonder if she has partial SBO with adhesions causing her pain. Will follow with you.  Electronic Signatures: Manya Silvas (MD)  (Signed on 20-Oct-15 18:53)  Authored  Last Updated: 20-Oct-15 18:53 by Manya Silvas (MD)

## 2014-06-07 NOTE — Consult Note (Signed)
PATIENT NAME:  Amber Christensen, MURDY MR#:  [id number] DATE OF BIRTH:  [birth year]  DATE OF CONSULTATION:  09/22/2013  REFERRING PHYSICIAN:    CONSULTING PHYSICIAN:  Consuela Mimes, MD  HISTORY OF PRESENT ILLNESS: Amber Christensen is a 49 year old white female who was admitted to the hospital 4 days ago with severe abdominal pain, radiating to her back, associated with nausea, and vomiting. She states that at 1 point, she had a very low-grade fever, which is unusual for her as her baseline temperature is approximately 96. She recently moved from [place], to the area with her husband, as her husband's children from her previous marriage are here. That move was in February of 2015. In April of 2014, while she was living in [place], she had a pancreatic necrosectomy for a post ERCP pancreatitis episode. The patient apparently has had recurrent nonalcoholic pancreatitis since she was in her 45s, and has been treated with pancrelipase for chronic pancreatic exocrine insufficiency since her surgery. She says that baseline, she has diarrhea, and that her stools float in the toilet, and have a foul odor to them, and that she has lost 70 pounds in the last year. Unfortunately, at this time, none of her records from [place], including a liver biopsy, that was performed, are available.   PAST MEDICAL HISTORY: Recurrent nonalcoholic pancreatitis, starting age 33, with pancreatic necrosis, requiring pancreatic necrosectomy April 2014; depression, hypertension, insulin-dependent diabetes mellitus.   PAST SURGICAL HISTORY: Pancreatic necrosectomy, as mentioned above; tonsillectomy, cholecystectomy, hysterectomy, appendectomy, bladder repair; liver biopsy, multiple cystoscopies, cardiac catheterization, D and C.   MEDICATIONS:  1.  Lantus 6 units daily.  2.  Humalog 2 units with dinner.  3.  Hydrochlorothiazide/lisinopril 12.5/20 one daily.   ALLERGIES: CYCLOBENZAPRINE, PROPOXYPHENE, MAGNESIUM OXIDE, SOMA, MORPHINE,  DEMEROL, LEVAQUIN, ONDANSETRON.   SOCIAL HISTORY: The patient is married; and moved to [place], because her husband's 68, and 37 year old children from a previous marriage live here; she is recently employed at [employer]; she does not smoke cigarettes, and does not drink alcohol.   FAMILY HISTORY: Noncontributory.   REVIEW OF SYSTEMS: Negative for 10 systems with the following exceptions; the patient has weakness, abdominal pain, back pain, significant weight loss, nausea, vomiting, possible hematochezia, and recent psychological stressors.   PHYSICAL EXAMINATION: VITAL SIGNS: Height is not recorded, weight 129 pounds, temperature 98.9, pulse 83, respirations 20, blood pressure 142/88, oxygen saturation 94% at rest, on room air.  GENERAL: Reveals an emotionally distraught patient lying flat in the hospital bed with a nasogastric tube present.  HEENT: Pupils equally round, and reactive to light, extraocular movements intact, sclerae show mild icterus; oropharynx clear, mucous membranes moist, hearing intact to voice.  NECK: Supple with no lymphadenopathy.  HEART: Regular rate and rhythm with no murmurs, or rubs.  LUNGS: Clear to auscultation with normal respiratory effort bilaterally.  ABDOMEN: Mildly distended with a long midline scar, and a very small ventral hernia as part of that. The patient has significant abdominal tympany, but no tenderness despite the fact that she says it hurts when I push; in other words, there is no objective signs of rebound, tenderness, or guarding.  EXTREMITIES: No edema with normal capillary refill bilaterally.  NEUROLOGIC: Cranial nerves II through XII, motor, and sensation grossly intact.  PSYCHIATRIC: The patient appears somewhat anxious, but overall appropriate; given her a condition.   OTHER STUDIES:  1.  Electrolytes normal, magnesium normal, lipase normal; ammonia level 50, albumin 3.3, total bilirubin 5.2, alkaline phosphatase 166;  SGOT 238, SGPT  209, troponin normal; white blood cell count 4, hemoglobin 12, hematocrit 34%, platelet count 111,000,   2.  Urinalysis normal x 2.  3.  CT scan of the abdomen and pelvis with contrast on admission reveals diffuse hepatic steatosis with prior cholecystectomy, no biliary dilatation and patent hepatic, and portal veins; there are small tortuous, enhancing vessels, around the stomach compatible with early gastric varices; the pancreatic body and tail are fatty replaced, but the pancreatic head is unremarkable.  4.  Ultrasound reveals a common bile duct diameter of 7 mm, and a duplex ultrasound of the portal vein reveals it to be patent with normal hepatopetal flow.  5.  Abdominal films from today, revealed multiple dilated loops of colon, consistent with an adynamic colonic ileus.     ASSESSMENT: No confirmatory signs of cirrhosis, despite the fact that that is in her chart. The patient likely has a splenic vein thrombosis leading to left-sided portal hypertension accounting for her mild gastric varices, and her relative thrombocytopenia. She also appears to have an element of inadequately treated pancreatic insufficiency, and currently has received 7 mg of IV Dilaudid in the last 24 hours. This opioid intake may account for her mild jaundice, or she may have a mild obstructive component to her jaundice, either from her previous episodes of pancreatitis, or from sphincter of Oddi spasm from her opioids. She may benefit from a selective mu receptor antagonists, such as Relistor while she has a nasogastric tube, and is continuing to receive IV Dilaudid; this may help with her colon function.   Currently, there are no indications for surgical intervention.   My partner, Dr. Marlyce Huge will assume her surgical consultive care tomorrow.    ____________________________ Consuela Mimes, MD wfm:nt D: 09/22/2013 20:36:18 ET T: 09/22/2013 22:27:56 ET JOB#: [id number]  cc: Consuela Mimes, MD,  <Dictator> Consuela Mimes MD ELECTRONICALLY SIGNED 09/23/2013 4:21

## 2014-06-07 NOTE — H&P (Signed)
PATIENT NAMESHERIECE, Amber Christensen MR#:  [id number] DATE OF BIRTH:  10-Oct-1965  DATE OF ADMISSION:  09/19/2013  ADDENDUM  DIAGNOSTIC DATA: Lab values: Sodium 139, potassium 3.3, chloride 101, bicarb 27, creatinine 0.82, BUN 10, glucose 242, calcium 7.7, lipase 58, total protein 6.3, albumin 3.5, total bilirubin 4.5, alk phos 150, AST 63, ALT 133. Troponin less than 0.02. White blood cell count 5.2, hemoglobin 12.6, hematocrit 35.3, platelets 116,000, MCV 89. Urine is negative for signs of infection.   Imaging: CT of the abdomen and pelvis with contrast shows no acute intra-abdominal or pelvic finding. Extensive fatty liver infiltration. Pancreas body and tail are fatty replaced. Pancreatic head is unremarkable. Incidental left kidney cyst, 11 mm. No bowel obstruction, bowel wall thickening or free air. Postop changes to the bowel in the right lower quadrant. Prior hysterectomy. Prior cholecystectomy. Small tortuous enhancing vessels around the stomach compatible with early gastric varices suggesting portal hypertension.   Abdominal x-ray shows no acute abdominal process, no obstruction.   ASSESSMENT AND PLAN: 1.  Acute on chronic pancreatitis: It will be important to review this patient's records. She has asked her husband to bring them in the morning. For now continue with n.p.o. status, IV fluids, pain control, nausea control. The patient states that she has been advised to always use ciprofloxacin in the setting of pancreatitis. We will initiate ciprofloxacin and await records. CT is reassuring with no signs of edema or inflammation at this time around the pancreas. We will ask for gastroenterology consultation due to complicated history with recurrent pancreatitis, necrotic pancreatitis status post partial pancreatic resection, elevated liver function tests, and possible chronic hepatitis.  2.  Hypertension: Continue lisinopril/hydrochlorothiazide with p.r.n. labetalol.  3.  Insulin-dependent diabetes  mellitus: Will initiate sliding scale insulin and check hemoglobin A1c.  4.  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 We will ask for gastroenterology consultation.  5.  Hypokalemia: Will replete and recheck.  6.  Deep vein thrombosis prophylaxis: Heparin.  TIME SPENT DURING THIS ADMISSION: 40 minutes.  ____________________________ Earleen Newport. Volanda Napoleon, MD cpw:sb D: 09/20/2013 11:25:00 ET T: 09/20/2013 11:59:36 ET JOB#: [id number]  cc: Barnetta Chapel P. Volanda Napoleon, MD, <Dictator> Aldean Jewett MD ELECTRONICALLY SIGNED 09/28/2013 13:57

## 2014-06-07 NOTE — Discharge Summary (Signed)
Dates of Admission and Diagnosis:  Date of Admission 20-Sep-2013   Date of Discharge 27-Sep-2013   Admitting Diagnosis ac on ch pancreatitis   Final Diagnosis Ac on ch pancreatitis. Ch liver cirrhosis Esophagitis Hepatic encephalopathy Uncontrolled Htn    Chief Complaint/History of Present Illness 49 year old female who has just moved to [place] from [place] who has a past medical history of recurrent pancreatitis in her 65s and is status post necrotic pancreatitis and partial pancreatic resection in 2014, presents with abdominal pain and vomiting similar to prior episodes of pancreatitis. She reports that she has been having worsening abdominal pain over the past week, which became acutely worse today. Today, she also developed intractable vomiting, to the point of clear/bilious emesis. She has had no hematemesis. She has not had any diarrhea. She does have normal bowel movements, she feels she may have seen some blood streaks in the most recent bowel movement. She has had no fevers, chills, sweats.   Allergies:  Cyclobenzaprine: Unknown  Propoxyphene: Unknown  Magnesium Oxide: Unknown  Soma: Unknown  Morphine: Unknown  Demerol HCl: Unknown  Levaquin: Unknown  ondansetron: Unknown  Hepatic:  06-Aug-15 17:46   Bilirubin, Total  4.5  Alkaline Phosphatase  150 (46-116 NOTE: New Reference Range 09/03/13)  SGPT (ALT)  133 (14-63 NOTE: New Reference Range 09/03/13)  SGOT (AST)  63  Total Protein, Serum  6.3  Albumin, Serum 3.5  07-Aug-15 20:12   Bilirubin, Total  5.8  Bilirubin, Direct  0.5 (Result(s) reported on 20 Sep 2013 at 09:04PM.)  Alkaline Phosphatase  149 (46-116 NOTE: New Reference Range 09/03/13)  SGPT (ALT)  114 (14-63 NOTE: New Reference Range 09/03/13)  SGOT (AST)  59  Total Protein, Serum  6.2  Albumin, Serum 3.6  09-Aug-15 05:41   Bilirubin, Total  5.2  Alkaline Phosphatase  166 (46-116 NOTE: New Reference Range 09/03/13)  SGPT (ALT)  209  (14-63 NOTE: New Reference Range 09/03/13)  SGOT (AST)  238  Total Protein, Serum  5.7  Albumin, Serum  3.3  12-Aug-15 04:01   Bilirubin, Total  5.7  Bilirubin, Direct  0.5 (Result(s) reported on 25 Sep 2013 at 04:35AM.)  Alkaline Phosphatase  145 (46-116 NOTE: New Reference Range 09/03/13)  SGPT (ALT)  105 (14-63 NOTE: New Reference Range 09/03/13)  SGOT (AST)  56  Total Protein, Serum  6.0  Albumin, Serum  3.0  General Ref:  07-Aug-15 20:12   Mitochondrial (M2) Antibody ========== TEST NAME ==========  ========= RESULTS =========  = REFERENCE RANGE =  MITOCHONDRIAL (M2) AB  Mitochondrial (M2) Antibody Mitochondrial (M2) Antibody     [   9.2 Units            ]          0.0-20.0              Negative    0.0 - 20.0                                                Equivocal  20.1 - 24.9                                                Positive         >  24.9                                                                    .              Mitochondrial (M2) Antibodies are found in 90-96% of                patients with primary biliary cirrhosis.               [facility]            No: [id number]           [address], [place], [place] [address] Lindon Romp, MD         [phone]   Result(s) reported on 23 Sep 2013 at 01:50PM.  Anti-Smooth Muscle Antibody ========== TEST NAME ==========  ========= RESULTS =========  = REFERENCE RANGE =  ANTI-SMOOTH MUSCLE ABS  Actin (Smooth Muscle) Antibody Actin (Smooth Muscle) Antibody  [   12 Units             ]              0-19   Negative                     0 - 19                                 Weak positive               20 - 30                                 Moderate to strong positive     >30                                                                     .              Actin Antibodies are found in 52-85% of patients with                 autoimmune hepatitis or chronic active hepatitis and                  in 22% of patients with primary biliary cirrhosis.               [facility]  No: [id number]           [address], [place], [place] [address]           Lindon Romp, MD         [phone]   Result(s) reported on 23 Sep 2013 at 01:50PM.  ANA Comprehensive Panel ========== TEST NAME ==========  ========= RESULTS =========  = REFERENCE RANGE =  ANA COMPREHENSIVE PANEL  ANA Comprehensive Panel Anti-DNA (DS) Ab Qn             [   <  1 IU/mL             ]               0-9               Negative      <5                                                   Equivocal  5 - 9                                                   Positive      >9 RNP Antibodies                  [   <0.2 AI              ]           0.0-0.9 Smith Antibodies                [   <0.2 AI              ]           0.0-0.9 Antiscleroderma-70 Antibodies   [   <0.2 AI              ]           0.0-0.9 Sjogren's Anti-SS-A             [   <0.2 AI              ]           0.0-0.9 Sjogren's Anti-SS-B          [   <0.2 AI              ]           0.0-0.9 Antichromatin Antibodies        [   <0.2 AI              ]           0.0-0.9 Anti-Jo-1                       [   <0.2 AI              ]           0.0-0.9 Anti-Centromere B Antibodies    [   <0.2 AI              ]           0.0-0.9 See below:                      [   Final Report         ]                   Autoantibody                       Disease Association ------------------------------------------------------------         Condition  Frequency ---------------------   ------------------------   --------- Antinuclear Antibody,    SLE, mixed connective Direct (ANA-D)           tissue diseases ---------------------   ------------------------   --------- dsDNA                    SLE                        40 - 60% ---------------------   ------------------------   --------- Chromatin                Drug induced SLE                90%                           SLE                        48 - 97% ---------------------   ------------------------   --------- SSA (Ro)                 SLE                        25 - 35%                          Sjogren's Syndrome         40 - 70%                          Neonatal Lupus                 100% ---------------------   ------------------------   --------- SSB (La)                 SLE                             10%                          Sjogren's Syndrome              30% ---------------------   -----------------------    --------- Sm (anti-Smith)          SLE                        15 - 30% ---------------------   -----------------------    --------- RNP                      Mixed Connective Tissue                          Disease                         95% (U1 nRNP,          SLE                        30 - 50% anti-ribonucleoprotein)  Polymyositis and/or  Dermatomyositis                 20% ---------------------   ------------------------   --------- Scl-70 (antiDNA          Scleroderma (diffuse)      20 - 35% topoisomerase)           Crest                           13% ---------------------   ------------------------   --------- Jo-1                     Polymyositis and/or                          Dermatomyositis            20 - 40% ---------------------   ------------------------   --------- Centromere B             Scleroderma - Crest                          variant                         80%               [facility]            No: [id number]  [address], [place], [place] [address]           Lindon Romp, MD         [phone]   Result(s) reported on 23 Sep 2013 at 01:50PM.  Hepatitis C Virus Antibody ========== TEST NAME ==========  ========= RESULTS =========  = REFERENCE RANGE =  HCV ANTIBODY  HCV Antibody Hep C Virus Ab                  [   <0.1 s/co ratio      ]           0.0-0.9                                                   Negative:     < 0.8                                             Indeterminate: 0.8 - 0.9                                                  Positive:     > 0.9                                                                      .  In order to reduce the incidence of a false positive                  result, the CDC recommends that all s/co ratios                  between 1.0 and 10.9 be confirmed by a more specific                  supplemental or PCR testing. [facility] offers HCV Ab                 w/Reflex to Verification test [phone].               [facility]            No: [id number]           [address], [place], [place] [address]           Lindon Romp, MD         [phone]   Result(s) reportedon 23 Sep 2013 at 01:50PM.  Hepatitis B Surface Antibody, Qual ========== TEST NAME ==========  ========= RESULTS =========  = REFERENCE RANGE =  HEPATITIS B SURF.AB,QUAL  Hep B Surface Ab Hep B Surface Ab, Qual          [   Non Reactive         ]                                                Non Reactive: Inconsistent with immunity,                                            less than 10 mIU/mL                              Reactive:     Consistent with immunity,                                            greater than 9.9 mIU/mL               [facility]            No: [id number]           [address], [place], [place] [address]           Lindon Romp, MD         [phone]   Result(s) reported on 23 Sep 2013 at 01:50PM.  HBsAg ========== TEST NAME ==========  ========= RESULTS =========  = REFERENCE RANGE =  HEPATITIS B SURFACE AG  HBsAg Screen HBsAg Screen                    [   Negative             ]          Negative               [facility]  No: [id number]           [address] [place], [place] [address]           Lindon Romp, MD         [phone]   Result(s) reported on 23 Sep 2013 at 05:48AM.  Hepatitis B Core Antibody Total ========== TEST NAME ==========  ========= RESULTS =========  = REFERENCE RANGE =  HEPATITIS B CORE AB,TOTL  Hep B Core Ab, Tot Hep B Core Ab, Tot              [   Negative             ]          Negative               [facility]      No: [id number]           [address] [place], [place] [address]           Lindon Romp, MD         [phone]   Result(s) reported on 23 Sep 2013 at 01:50PM.  Hepatitis A Antibody, Total ========== TEST NAME ==========  ========= RESULTS =========  = REFERENCE RANGE =  HEPATITIS A ANTIBDY,TOTL  Hep A Ab, Total Hep A Ab, Total                 [   Negative             ]          Negative               [facility]   No: [id number]           [address] [place], [place] [address]           Lindon Romp, MD         [phone]   Result(s) reported on 23 Sep 2013 at 01:50PM.  Routine Chem:  06-Aug-15 17:46   Glucose, Serum  242  BUN 10  Creatinine (comp) 0.82  Sodium, Serum 139  Potassium, Serum  3.3  Chloride, Serum 101  CO2, Serum 27  Calcium (Total), Serum  7.7  Anion Gap 11  Osmolality (calc) 285  eGFR (African American) >60  eGFR (Non-African American) >60 (eGFR values <12m/min/1.73 m2 may be an indication of chronic kidney disease (CKD). Calculated eGFR is useful in patients with stable renal function. The eGFR calculation will not be reliable in acutely ill patients when serum creatinine is changing rapidly. It is not useful in  patients on dialysis. The eGFR calculation may not be applicable to patients at the low and high extremes of body sizes, pregnant women, and vegetarians.)  Lipase  58 (Result(s) reported on 19 Sep 2013 at 06:05PM.)  07-Aug-15 04:21   Glucose, Serum  221  BUN 7  Creatinine (comp) 0.79  Sodium, Serum 138  Potassium, Serum  2.9  Chloride, Serum 104  CO2, Serum 27  Calcium (Total), Serum  6.6  Anion Gap 7   Osmolality (calc) 280  eGFR (African American) >60  eGFR (Non-African American) >60 (eGFR values <660mmin/1.73 m2 may be an indication of chronic kidney disease (CKD). Calculated eGFR is useful in patients with stable renal function. The eGFR calculation will not be reliable in acutely ill patients when serum creatinine is changing rapidly. It is not useful in  patients on dialysis. The eGFR calculation may not be applicable to  patients at the low and high extremes of body sizes, pregnant women, and vegetarians.)  Magnesium, Serum  1.0 (1.8-2.4 THERAPEUTIC RANGE: 4-7 mg/dL TOXIC: > 10 mg/dL  -----------------------)  Result Comment CALCIUM - RESULTS VERIFIED BY REPEAT TESTING.  - NOTIFIED OF CRITICAL VALUE  - C/ MEGAN WERNER @0514  09-20-13.Marland KitchenAJO  - READ-BACK PROCESS PERFORMED.  Result(s) reported on 20 Sep 2013 at 04:57AM.  Hemoglobin A1c [facility])  7.4 (The American Diabetes Association recommends that a primary goal of therapy should be <7% and that physicians should reevaluate the treatment regimen in patients with HbA1c values consistently >8%.)    20:12   Ammonia, Plasma  50 (Result(s) reported on 20 Sep 2013 at 08:52PM.)  08-Aug-15 04:51   Potassium, Serum  3.4  09-Aug-15 05:41   Glucose, Serum  198  BUN  4  Creatinine (comp) 0.76  Sodium, Serum 139  Potassium, Serum 4.5  Chloride, Serum 105  CO2, Serum 26  Calcium (Total), Serum  7.9  Anion Gap 8  Osmolality (calc) 280  eGFR (African American) >60  eGFR (Non-African American) >60 (eGFR values <77m/min/1.73 m2 may be an indication of chronic kidney disease (CKD). Calculated eGFR is useful in patients with stable renal function. The eGFR calculation will not be reliable in acutely ill patients when serum creatinine is changing rapidly. It is not useful in  patients on dialysis. The eGFR calculation may not be applicable to patients at the low and high extremes of body sizes, pregnant women, and vegetarians.)  Lipase   33 (Result(s) reported on 22 Sep 2013 at 02:53PM.)  10-Aug-15 04:59   Potassium, Serum  3.2  11-Aug-15 07:27   Potassium, Serum  2.9  12-Aug-15 04:01   Glucose, Serum  162  BUN  2  Creatinine (comp) 0.82  Sodium, Serum 139  Potassium, Serum  3.4  Chloride, Serum 99  CO2, Serum 31  Calcium (Total), Serum  7.8  Anion Gap 9  Osmolality (calc) 277  eGFR (African American) >60  eGFR (Non-African American) >60 (eGFR values <626mmin/1.73 m2 may be an indication of chronic kidney disease (CKD). Calculated eGFR is useful in patients with stable renal function. The eGFR calculation will not be reliable in acutely ill patients when serum creatinine is changing rapidly. It is not useful in  patients on dialysis. The eGFR calculation may not be applicable to patients at the low and high extremes of body sizes, pregnant women, and vegetarians.)  Cardiac:  06-Aug-15 17:46   Troponin I < 0.02 (0.00-0.05 0.05 ng/mL or less: NEGATIVE  Repeat testing in 3-6 hrs  if clinically indicated. >0.05 ng/mL: POTENTIAL  MYOCARDIAL INJURY. Repeat  testing in 3-6 hrs if  clinically indicated. NOTE: An increase or decrease  of 30% or more on serial  testing suggests a  clinically important change)  Routine UA:  06-Aug-15 17:46   Color (UA) Yellow  Clarity (UA) Clear  Glucose (UA) 50 mg/dL  Bilirubin (UA) Negative  Ketones (UA) Negative  Specific Gravity (UA) 1.009  Blood (UA) Negative  pH (UA) 6.0  Protein (UA) Negative  Nitrite (UA) Negative  Leukocyte Esterase (UA) Negative (Result(s) reported on 19 Sep 2013 at 07:46PM.)  RBC (UA) <1 /HPF  WBC (UA) 1 /HPF  Bacteria (UA) NONE SEEN  Epithelial Cells (UA) <1 /HPF (Result(s) reported on 19 Sep 2013 at 07:46PM.)  14-Aug-15 05:57   Color (UA) Yellow  Clarity (UA) Clear  Glucose (UA) Negative  Bilirubin (UA) Negative  Ketones (UA) Negative  Specific Gravity (UA) 1.011  Blood (UA) 1+  pH (UA) 5.0  Protein (UA) Negative  Nitrite (UA)  Negative  Leukocyte Esterase (UA) 1+ (Result(s) reported on 27 Sep 2013 at 06:17AM.)  RBC (UA) 1 /HPF  WBC (UA) 1 /HPF  Bacteria (UA) 1+  Epithelial Cells (UA) 4 /HPF  Mucous (UA) PRESENT  Hyaline Cast (UA) 5 /LPF (Result(s) reported on 27 Sep 2013 at 06:17AM.)  Routine Coag:  07-Aug-15 20:12   Prothrombin  15.1  INR 1.2 (INR reference interval applies to patients on anticoagulant therapy. A single INR therapeutic range for coumarins is not optimal for all indications; however, the suggested range for most indications is 2.0 - 3.0. Exceptions to the INR Reference Range may include: Prosthetic heart valves, acute myocardial infarction, prevention of myocardial infarction, and combinations of aspirin and anticoagulant. The need for a higher or lower target INR must be assessed individually. Reference: The Pharmacology and Management of the Vitamin K  antagonists: the seventh ACCP Conference on Antithrombotic and Thrombolytic Therapy. XLKGM.0102 Sept:126 (3suppl): N9146842. A HCT value >55% may artifactually increase the PT.  In one study,  the increase was an average of 25%. Reference:  "Effect on Routine and Special Coagulation Testing Values of Citrate Anticoagulant Adjustment in Patients with High HCT Values." American Journal of Clinical Pathology 2006;126:400-405.)  Routine Hem:  06-Aug-15 17:46   WBC (CBC) 5.2  RBC (CBC) 3.95  Hemoglobin (CBC) 12.6  Hematocrit (CBC) 35.3  Platelet Count (CBC)  116 (Result(s) reported on 19 Sep 2013 at 06:03PM.)  MCV 89  MCH 31.9  MCHC 35.8  RDW 13.1  07-Aug-15 04:21   WBC (CBC) 5.7  RBC (CBC)  3.78  Hemoglobin (CBC) 12.1  Hematocrit (CBC)  33.9  Platelet Count (CBC)  108  MCV 90  MCH 32.0  MCHC 35.7  RDW 12.9  Neutrophil % 61.0  Lymphocyte % 32.7  Monocyte % 5.7  Eosinophil % 0.2  Basophil % 0.4  Neutrophil # 3.4  Lymphocyte # 1.8  Monocyte # 0.3  Eosinophil # 0.0  Basophil # 0.0 (Result(s) reported on 20 Sep 2013 at  04:57AM.)  09-Aug-15 05:41   WBC (CBC) 4.4  RBC (CBC)  3.79  Hemoglobin (CBC) 12.0  Hematocrit (CBC)  34.3  Platelet Count (CBC)  111  MCV 91  MCH 31.7  MCHC 35.0  RDW 13.1  Neutrophil % 54.0  Lymphocyte % 38.2  Monocyte % 5.8  Eosinophil % 1.4  Basophil % 0.6  Neutrophil # 2.4  Lymphocyte # 1.7  Monocyte # 0.3  Eosinophil # 0.1  Basophil # 0.0 (Result(s) reported on 22 Sep 2013 at 05:58AM.)  12-Aug-15 04:01   WBC (CBC) 4.8  RBC (CBC) 4.10  Hemoglobin (CBC) 12.7  Hematocrit (CBC) 37.1  Platelet Count (CBC)  110  MCV 91  MCH 31.0  MCHC 34.2  RDW 12.9  Neutrophil % 48.9  Lymphocyte % 41.8  Monocyte % 7.8  Eosinophil % 1.2  Basophil % 0.3  Neutrophil # 2.3  Lymphocyte # 2.0  Monocyte # 0.4  Eosinophil # 0.1  Basophil # 0.0 (Result(s) reported on 25 Sep 2013 at 04:35AM.)   PERTINENT RADIOLOGY STUDIES: XRay:    08-Aug-15 15:54, Abdomen Flat and Erect  Abdomen Flat and Erect   REASON FOR EXAM:    abdominal pain, h/o chronic pancreatitis  COMMENTS:       PROCEDURE: DXR - DXR ABDOMEN 2 V FLAT AND ERECT  - Sep 21 2013  3:54PM     CLINICAL DATA:  Abdominal pain, chronic pancreatitis    EXAM:  ABDOMEN - 2 VIEW    COMPARISON:  CT abdomen pelvis dated 09/19/2013    FINDINGS:  Nonobstructive bowel gas pattern.  No evidence of free air under the diaphragm on the upright view.    Mild to moderate colonic stool burden.    Cholecystectomy clips.     IMPRESSION:  No evidence of small bowel obstruction or free air.      Electronically Signed    By: Julian Hy M.D.    On: 09/21/2013 16:19       Verified By: Julian Hy, M.D.,    09-Aug-15 14:17, Abdomen Flat and Erect  Abdomen Flat and Erect   REASON FOR EXAM:    abdominal pain, distension, n/v  COMMENTS:       PROCEDURE: DXR - DXR ABDOMEN 2 V FLAT AND ERECT  - Sep 22 2013  2:17PM     CLINICAL DATA:  Abdominal bloating/pain, distention, nausea/vomiting    EXAM:  ABDOMEN - 2  VIEW    COMPARISON:  09/21/2013    FINDINGS:  Mild of bowel gas pattern.  Multiple distended loops of colon.    Surgical sutures in the right mid abdomen.    Cholecystectomy clips.    No evidence of free air under the diaphragm on the upright view.     IMPRESSION:  No evidence of small bowel obstruction or free air.    Multiple dilated loops of colon, suggesting adynamic colonic ileus.      Electronically Signed    By: Julian Hy M.D.    On: 09/22/2013 14:35         Verified By: Julian Hy, M.D.,    11-Aug-15 09:48, KUB - Kidney Ureter Bladder  KUB - Kidney Ureter Bladder   REASON FOR EXAM:    compare- intestinal obstruction.  COMMENTS:       PROCEDURE: DXR - DXR KIDNEY URETER BLADDER  - Sep 24 2013  9:48AM     CLINICAL DATA:  Abdominal pain, small bowel obstruction.    EXAM:  ABDOMEN - 1 VIEW    COMPARISON:  09/22/2013    FINDINGS:  Nasogastric tube has tip over the stomach with side port just above  the gastroesophageal junction as this is unchanged. Surgical sutures  are present over the right mid to lower abdomen. There is less  gaseous distention of the colon. There is a air-filled bowel loops  in the left mid abdomen which may represent a mildly prominent small  bowel loop. No free peritoneal air. No definite obstruction.  Remainder of the exam is unchanged.     IMPRESSION:  Nonspecific, nonobstructive bowel gas pattern with possible upper  normal caliber air-filled small bowel loop in the left mid abdomen.    Nasogastric tube unchanged with tip over the stomach and side-port  just above the gastroesophageal junction.      Electronically Signed    By: Marin Olp M.D.    On: 09/24/2013 10:21         Verified By: Pearletha Alfred, M.D.,    12-Aug-15 07:02, KUB - Kidney Ureter Bladder  KUB - Kidney Ureter Bladder   REASON FOR EXAM:    abdominal pain, pseudoobstruction, please compare   with previous  COMMENTS:       PROCEDURE:  DXR - DXR KIDNEY URETER BLADDER  - Sep 25 2013  7:02AM     CLINICAL DATA:  Abdominal pain, pseudo obstruction, followup  EXAM:  ABDOMEN - 1 VIEW    COMPARISON:  09/24/2013    FINDINGS:  Anastomotic staple line in RIGHT mid abdomen in RIGHT pelvis.  Surgical clips RIGHT upper quadrant likely cholecystectomy.    Scattered gas within large and small bowel loops.    Minimally prominent RIGHT colon.    No evidence of bowel wall thickening or obstruction.    Gas present to rectum.    Bones unremarkable.    No urinary tract calcification.     IMPRESSION:  Nonobstructive bowel gas pattern.    Little interval change.      Electronically Signed    By: Lavonia Dana M.D.    On: 09/25/2013 08:16         Verified By: Burnetta Sabin, M.D.,    14-Aug-15 07:23, KUB - Kidney Ureter Bladder  KUB - Kidney Ureter Bladder   REASON FOR EXAM:    colonic pseudoobstruction, please compare with   previous film.  COMMENTS:       PROCEDURE: DXR - DXR KIDNEY URETER BLADDER  - Sep 27 2013  7:23AM     CLINICAL DATA:  Colonic pseudo-obstruction    EXAM:  ABDOMEN - 1 VIEW    COMPARISON:  September 25, 2013    FINDINGS:  Borderline prominence of the right colon remains without change.  Anastomotic staple lines on the right appear unchanged. The  remainder of the bowel appears unremarkable. There is air in the  rectum. There is moderate stool in the colon. There are surgical  clips in right upper quadrant.     IMPRESSION:  Right colon is borderline prominent but stable. Mild colonic ileus  is possible. No frank obstruction is seen on this supine  examination. No free air. Note that there is air in the rectum. The  appearance is essentially stable compared to recent prior study.      Electronically Signed    By: Lowella Grip M.D.    On: 09/27/2013 08:08     Verified By: Leafy Kindle. Jasmine December, M.D.,  [place]:    09-Aug-15 12:22, US Abdomen Limited Survey  US Abdomen Limited Survey    REASON FOR EXAM:    distenstion. ascitis?  COMMENTS:   Body Site: Ascites search - Abd. Quadrants imaged for free   fluid    PROCEDURE: [place]  - US ABDOMEN LIMITED SURVEY  - Sep 22 2013 12:22PM     CLINICAL DATA:  49 year old female with abdominal distention,  vomiting. Query ascites. Initial encounter.    EXAM:  LIMITED ABDOMEN ULTRASOUND FOR ASCITES    TECHNIQUE:  Limited ultrasound survey for ascites was performed in all four  abdominal quadrants.  COMPARISON:  CT Abdomen and Pelvis 09/19/2013.    FINDINGS:  No ascites identified in the abdomen. The bladder is severely  distended (estimated volume 977 mL).     IMPRESSION:  1. No abdominal ascites.  2. Severe bladder distension, the patient advises the technologist  she has difficulty emptyingher bladder. Consider Foley catheter  placement.      Electronically Signed    By: Lars Pinks M.D.    On: 09/22/2013 13:04         Verified By: Gwenyth Bender. [place] Crane, M.D.,  LabUnknown:    11-Aug-15 09:48, KUB - Kidney Ureter Bladder  PACS Image    Pertinent Past History:  Pertinent Past History 1.  Recurrent non alcoholic pancreatitis starting at age 33, necrotic pancreatitis status post partial pancreatic resection in 2014.  2.  Biliary cirrhosis.  3.  Depression.  4.  Chronic hepatitis.  5.  Hypertension.  6.  Insulin-dependent diabetes mellitus.  7. masses of the breast, kidney and bladder which she thinks could be malignant   Hospital Course:  Hospital Course This pleasant 49 year old woman with a history of recurrent pancreatitis since her 20's who is s/p partial pancreatic resection due to necrotic pancreatitis in 2014 presents with symptoms suggestive of pancreatitis flare.  She has had pain for greater than 1 week, increasing for 24 hrs before admission associated with vomiting.  She has just moved to [place], [place] with her daughter from [place].  She had formerly been followed closely by a gastroenterologist.  * Acute on  chronic pancreatitis Pain meds. IVF. GI following. pancreatic replacement enzymes.  started on protonix bid and sucralfate.  * Esophagitis   EGD done - started on clear liquid diet, upgraded to full liquid- tolerated.   tolerated soft diet- advised pt , to continue soft diet for next few days, and not to eat late in evening- may d/c if OK with GI - after lunch.  * Uncontrolled Hypertension Increase HCTZ-lisinopril to BID.  Added norvasc. better now,.  * Intestinal obstruction- due to pain meds.   Relistor. no BM yet, but Xray shows non specific bowel gas pattern.   Appreciated Sx consult.  * IDDM: Sliding Scale Insulin, A1C 7.4  * Hypokalemia replace  * Cirrhosis. PBS? Elevated LFTs. Monitor. needs tertiary GI f/u after discharge. Afebrile. normal WBC.   GI sent multiple work ups for Liver issues.  * hepatic encephalopathy Ammonia elevated. Started lactulose. mentation clear today. had loose stool- stopped. could not go yesterday- as had some more nausea after soft diet- but better today- d/c home.   Condition on Discharge Stable   Code Status:  Code Status Full Code   DISCHARGE INSTRUCTIONS HOME MEDS:  Medication Reconciliation: Patient's Home Medications at Discharge:     Medication Instructions  humalog 100 units/ml subcutaneous solution  2 unit(s) subcutaneous once a day (in the evening) with dinner   lantus 100 units/ml subcutaneous solution  6 unit(s) subcutaneous once a day (at bedtime)   hydrochlorothiazide-lisinopril 12.5 mg-20 mg oral tablet  1 tab(s) orally once a day   creon   36,000 units before meals orally  and 1 with snacks.   oxycodone 5 mg oral tablet  1 tab(s) orally every 4 to 6 hours, As Needed, severe pain (7-10/10) , As needed, severe pain (7-10/10)   promethazine 12.5 mg oral tablet  1 tab(s) orally every 6 hours, As Needed - for Nausea, Vomiting   amlodipine 5 mg oral tablet  1 tab(s) orally once a day   sucralfate 1 g/10 ml oral suspension   10 milliliter(s) orally 4 times a day (before meals and at bedtime)   pantoprazole 40 mg oral delayed release tablet  1 tab(s) orally 2 times a day     Physician's Instructions:  Home Health? No   Diet Low Sodium   Activity Limitations As tolerated   Return to Work Not Applicable   Time frame for Follow Up Appointment 1-2 weeks  GI clinic.   Electronic Signatures: Vaughan Basta (MD)  (Signed 17-Aug-15 14:17)  Authored: ADMISSION DATE AND DIAGNOSIS, CHIEF COMPLAINT/HPI, Allergies, PERTINENT LABS, PERTINENT RADIOLOGY STUDIES, PERTINENT PAST HISTORY, HOSPITAL COURSE, DISCHARGE INSTRUCTIONS HOME MEDS, PATIENT INSTRUCTIONS   Last Updated: 17-Aug-15 14:17 by Vaughan Basta (MD)

## 2014-06-07 NOTE — Consult Note (Signed)
Chief Complaint:  Subjective/Chief Complaint feeling some better today, no emesis since yesterday, abdominal pain a little better, less distended.   VITAL SIGNS/ANCILLARY NOTES: **Vital Signs.:   11-Aug-15 04:41  Vital Signs Type Routine  Temperature Temperature (F) 98.5  Celsius 36.9  Pulse Pulse 72  Respirations Respirations 20  Systolic BP Systolic BP 244  Diastolic BP (mmHg) Diastolic BP (mmHg) 82  Mean BP 104  Pulse Ox % Pulse Ox % 93  Pulse Ox Activity Level  At rest  Oxygen Delivery Room Air/ 21 %   Brief Assessment:  Cardiac Regular   Respiratory clear BS   Gastrointestinal details normal Soft  No rebound tenderness  mild /minimal distension, bs positive, generalixed tenderness, still mostly upper.   Lab Results: Hepatic:  11-Aug-15 07:27   Bilirubin, Total  5.7  Bilirubin, Direct  0.4 (Result(s) reported on 24 Sep 2013 at 08:04AM.)  Alkaline Phosphatase  155 (46-116 NOTE: New Reference Range 09/03/13)  SGPT (ALT)  133 (14-63 NOTE: New Reference Range 09/03/13)  SGOT (AST)  49  Total Protein, Serum  6.0  Albumin, Serum  3.2  Routine Chem:  11-Aug-15 07:27   Glucose, Serum  215  BUN  1  Creatinine (comp) 0.76  Sodium, Serum 138  Potassium, Serum  2.9  Chloride, Serum  96  CO2, Serum  35  Calcium (Total), Serum  7.7  Anion Gap 7  Osmolality (calc) 278  eGFR (African American) >60  eGFR (Non-African American) >60 (eGFR values <34m/min/1.73 m2 may be an indication of chronic kidney disease (CKD). Calculated eGFR is useful in patients with stable renal function. The eGFR calculation will not be reliable in acutely ill patients when serum creatinine is changing rapidly. It is not useful in  patients on dialysis. The eGFR calculation may not be applicable to patients at the low and high extremes of body sizes, pregnant women, and vegetarians.)  Routine Coag:  11-Aug-15 07:27   Prothrombin 14.3  INR 1.1 (INR reference interval applies to patients on  anticoagulant therapy. A single INR therapeutic range for coumarins is not optimal for all indications; however, the suggested range for most indications is 2.0 - 3.0. Exceptions to the INR Reference Range may include: Prosthetic heart valves, acute myocardial infarction, prevention of myocardial infarction, and combinations of aspirin and anticoagulant. The need for a higher or lower target INR must be assessed individually. Reference: The Pharmacology and Management of the Vitamin K  antagonists: the seventh ACCP Conference on Antithrombotic and Thrombolytic Therapy. CWNUUV.2536Sept:126 (3suppl): [id number] A HCT value >55% may artifactually increase the PT.  In one study,  the increase was an average of 25%. Reference:  "Effect on Routine and Special Coagulation Testing Values of Citrate Anticoagulant Adjustment in Patients with High HCT Values." American Journal of Clinical Pathology 2006;126:400-405.)  Routine Hem:  11-Aug-15 07:27   WBC (CBC) 4.4  RBC (CBC) 4.14  Hemoglobin (CBC) 13.1  Hematocrit (CBC) 37.2  Platelet Count (CBC)  107  MCV 90  MCH 31.6  MCHC 35.1  RDW 13.1  Neutrophil % 60.1  Lymphocyte % 31.3  Monocyte % 7.0  Eosinophil % 1.2  Basophil % 0.4  Neutrophil # 2.7  Lymphocyte # 1.4  Monocyte # 0.3  Eosinophil # 0.1  Basophil # 0.0 (Result(s) reported on 24 Sep 2013 at 07:57AM.)   Assessment/Plan:  Assessment/Plan:  Assessment 1) abnormal lfts-severe hepatic steatosis.  Good synthetic fnx, normal pt.  Lfts improved today after stopping cipro.   2) colonic pseudoobstruction-much improved-continue  to decrease narcotic use as possible.  3) n/v-improved-will plan EGD for tomorrow pm.  I have discussed the risks benefits and complications of egd to include not limited to bleeding infection perforation  and sedation and she wishes to proceed.   Plan continue current, will trial clears today.   Electronic Signatures: Loistine Simas (MD)  (Signed 11-Aug-15  14:56)  Authored: Chief Complaint, VITAL SIGNS/ANCILLARY NOTES, Brief Assessment, Lab Results, Assessment/Plan   Last Updated: 11-Aug-15 14:56 by Loistine Simas (MD)

## 2014-06-07 NOTE — Consult Note (Signed)
Chief Complaint:  Subjective/Chief Complaint less nausea today, no emesis.  abd pain continues, passing loose stool yesterday, none today.  feeling a little better. currently npo   Brief Assessment:  Cardiac Regular   Respiratory clear BS   Gastrointestinal details normal Soft  No rebound tenderness  mild distension, mild generalized abdominal discomfort, bowel sounds positive.   Lab Results: Hepatic:  06-Aug-15 17:46   Bilirubin, Total  4.5  Alkaline Phosphatase  150 (46-116 NOTE: New Reference Range 09/03/13)  SGPT (ALT)  133 (14-63 NOTE: New Reference Range 09/03/13)  SGOT (AST)  63  07-Aug-15 20:12   Bilirubin, Total  5.8  Bilirubin, Direct  0.5 (Result(s) reported on 20 Sep 2013 at 09:04PM.)  Alkaline Phosphatase  149 (46-116 NOTE: New Reference Range 09/03/13)  SGPT (ALT)  114 (14-63 NOTE: New Reference Range 09/03/13)  SGOT (AST)  59  08-Aug-15 04:51   Bilirubin, Total  5.6  Alkaline Phosphatase  141 (46-116 NOTE: New Reference Range 09/03/13)  SGPT (ALT)  101 (14-63 NOTE: New Reference Range 09/03/13)  SGOT (AST)  55  09-Aug-15 05:41   Bilirubin, Total  5.2  Alkaline Phosphatase  166 (46-116 NOTE: New Reference Range 09/03/13)  SGPT (ALT)  209 (14-63 NOTE: New Reference Range 09/03/13)  SGOT (AST)  238  10-Aug-15 04:59   Bilirubin, Total  6.4  Bilirubin, Direct  0.6 (Result(s) reported on 23 Sep 2013 at 05:43AM.)  Alkaline Phosphatase  173 (46-116 NOTE: New Reference Range 09/03/13)  SGPT (ALT)  208 (14-63 NOTE: New Reference Range 09/03/13)  SGOT (AST)  130  Total Protein, Serum  6.2  Albumin, Serum  3.2  General Ref:  07-Aug-15 20:12   Mitochondrial (M2) Antibody ========== TEST NAME ==========  ========= RESULTS =========  = REFERENCE RANGE =  MITOCHONDRIAL (M2) AB  Mitochondrial (M2) Antibody Mitochondrial (M2) Antibody     [   9.2 Units            ]          0.0-20.0              Negative    0.0 - 20.0                                                 Equivocal  20.1 - 24.9                                                Positive         >24.9                                                                    .              Mitochondrial (M2) Antibodies are found in 90-96% of                patients with primary biliary cirrhosis.  [facility]            No: [id number]           [address] [place], [place] [address] Lindon Romp, MD         [phone]   Result(s) reported on 23 Sep 2013 at 01:50PM.  Anti-Smooth Muscle Antibody ========== TEST NAME ==========  ========= RESULTS =========  = REFERENCE RANGE =  ANTI-SMOOTH MUSCLE ABS  Actin (Smooth Muscle) Antibody Actin (Smooth Muscle) Antibody  [   12 Units             ]              0-19   Negative                     0 - 19                                 Weak positive               20 - 30                                 Moderate to strong positive     >30                                                                     .              Actin Antibodies are found in 52-85% of patients with                 autoimmune hepatitis or chronic active hepatitis and                 in 22% of patients with primary biliary cirrhosis.               [facility] [place] No: [id number]           [address], [place], [place] [address]           Lindon Romp, MD         [id number]   Result(s) reported on 23 Sep 2013 at 01:50PM.  ANA Comprehensive Panel ========== TEST NAME ==========  ========= RESULTS =========  = REFERENCE RANGE =  ANA COMPREHENSIVE PANEL  ANA Comprehensive Panel Anti-DNA (DS) Ab Qn             [   <1 IU/mL             ]               0-9               Negative      <5                                                   Equivocal  5 - 9  Positive      >9 RNP Antibodies                  [   <0.2 AI              ]           0.0-0.9 Smith Antibodies                 [   <0.2 AI              ]           0.0-0.9 Antiscleroderma-70 Antibodies   [   <0.2 AI              ]           0.0-0.9 Sjogren's Anti-SS-A             [   <0.2 AI              ]           0.0-0.9 Sjogren's Anti-SS-B          [   <0.2 AI              ]           0.0-0.9 Antichromatin Antibodies        [   <0.2 AI              ]           0.0-0.9 Anti-Jo-1                       [   <0.2 AI              ]           0.0-0.9 Anti-Centromere B Antibodies    [   <0.2 AI              ]           0.0-0.9 See below:                      [   Final Report         ]                   Autoantibody                       Disease Association ------------------------------------------------------------         Condition                  Frequency ---------------------   ------------------------   --------- Antinuclear Antibody,    SLE, mixed connective Direct (ANA-D)           tissue diseases ---------------------   ------------------------   --------- dsDNA                    SLE                        40 - 60% ---------------------   ------------------------   --------- Chromatin                Drug induced SLE                90%  SLE                        48 - 97% ---------------------   ------------------------   --------- SSA (Ro)                 SLE                        25 - 35%                          Sjogren's Syndrome         40 - 70%                          Neonatal Lupus                 100% ---------------------   ------------------------   --------- SSB (La)                 SLE                             10%                          Sjogren's Syndrome              30% ---------------------   -----------------------    --------- Sm (anti-Smith)          SLE                        15 - 30% ---------------------   -----------------------    --------- RNP                      Mixed Connective Tissue                          Disease                          95% (U1 nRNP,          SLE                        30 - 50% anti-ribonucleoprotein)  Polymyositis and/or                          Dermatomyositis                 20% ---------------------   ------------------------   --------- Scl-70 (antiDNA          Scleroderma (diffuse)      20 - 35% topoisomerase)           Crest                           13% ---------------------   ------------------------   --------- Jo-1                     Polymyositis and/or                          Dermatomyositis  20 - 40% ---------------------   ------------------------   --------- Centromere B             Scleroderma - Crest                          variant                         80%               [facility]            No: [id number]  [address], [place], [place] [address]           Lindon Romp, MD         [phone]   Result(s) reported on 23 Sep 2013 at 01:50PM.  Hepatitis C Virus Antibody ========== TEST NAME ==========  ========= RESULTS =========  = REFERENCE RANGE =  HCV ANTIBODY  HCV Antibody Hep C Virus Ab                  [   <0.1 s/co ratio      ]           0.0-0.9                                                  Negative:     < 0.8                                             Indeterminate: 0.8 - 0.9                                                  Positive:     > 0.9                                                                      .                  In order to reduce the incidence of a false positive                  result, the CDC recommends that all s/co ratios                  between 1.0 and 10.9 be confirmed by a more specific                  supplemental or PCR testing. [facility] offers HCV Ab                 w/Reflex to Verification test [phone].               [facility]            No: [id number]  [address] [place], [place] [address]           Lindon Romp, MD         [phone]   Result(s) reportedon 23 Sep 2013 at 01:50PM.  Hepatitis B Surface Antibody, Qual ========== TEST NAME ==========  ========= RESULTS =========  = REFERENCE RANGE =  HEPATITIS B SURF.AB,QUAL  Hep B Surface Ab Hep B Surface Ab, Qual          [   Non Reactive         ]                                                Non Reactive: Inconsistent with immunity,                                            less than 10 mIU/mL                              Reactive:     Consistent with immunity,                                            greater than 9.9 mIU/mL               [facility]            No: [id number]           [address], [place], [place] [address]           Lindon Romp, MD         [phone]   Result(s) reported on 23 Sep 2013 at 01:50PM.  HBsAg ========== TEST NAME ==========  ========= RESULTS =========  = REFERENCE RANGE =  HEPATITIS B SURFACE AG  HBsAg Screen HBsAg Screen                    [   Negative             ]          Negative               [facility]            No: [id number]           [address], [place], [place] [address]           Lindon Romp, MD         [id number]   Result(s) reported on 23 Sep 2013 at 05:48AM.  Hepatitis B Core Antibody Total ========== TEST NAME ==========  ========= RESULTS =========  = REFERENCE RANGE =  HEPATITIS B CORE AB,TOTL  Hep B Core Ab, Tot Hep B Core Ab, Tot              [   Negative             ]          Negative               [facility]  No: [id number]           [address], [place], [place] [address]           Lindon Romp, MD         [phone]   Result(s) reported on 23 Sep 2013 at 01:50PM.  Hepatitis A Antibody, Total ========== TEST NAME ==========  ========= RESULTS =========  = REFERENCE RANGE =  HEPATITIS A ANTIBDY,TOTL  Hep A Ab, Total Hep A Ab, Total                 [   Negative             ]          Negative               [facility]   No: [id number]            [address] [place], [place] [address]           Lindon Romp, MD         [phone]   Result(s) reported on 23 Sep 2013 at 01:50PM.  Routine Chem:  10-Aug-15 04:59   Glucose, Serum  157  BUN  1  Creatinine (comp) 0.84  Sodium, Serum 140  Potassium, Serum  3.2  Chloride, Serum 99  CO2, Serum  33  Calcium (Total), Serum  7.8  Anion Gap 8  Osmolality (calc) 278  eGFR (African American) >60  eGFR (Non-African American) >60 (eGFR values <62m/min/1.73 m2 may be an indication of chronic kidney disease (CKD). Calculated eGFR is useful in patients with stable renal function. The eGFR calculation will not be reliable in acutely ill patients when serum creatinine is changing rapidly. It is not useful in  patients on dialysis. The eGFR calculation may not be applicable to patients at the low and high extremes of body sizes, pregnant women, and vegetarians.)  Routine Coag:  10-Aug-15 04:59   Prothrombin  14.8  INR 1.2 (INR reference interval applies to patients on anticoagulant therapy. A single INR therapeutic range for coumarins is not optimal for all indications; however, the suggested range for most indications is 2.0 - 3.0. Exceptions to the INR Reference Range may include: Prosthetic heart valves, acute myocardial infarction, prevention of myocardial infarction, and combinations of aspirin and anticoagulant. The need for a higher or lower target INR must be assessed individually. Reference: The Pharmacology and Management of the Vitamin K  antagonists: the seventh ACCP Conference on Antithrombotic and Thrombolytic Therapy. CMAUQJ.3354Sept:126 (3suppl): [id number] A HCT value >55% may artifactually increase the PT.  In one study,  the increase was an average of 25%. Reference:  "Effect on Routine and Special Coagulation Testing Values of Citrate Anticoagulant Adjustment in Patients with High HCT Values." American Journal of Clinical Pathology 2006;126:400-405.)   Routine Hem:  10-Aug-15 04:59   WBC (CBC) 5.1  RBC (CBC) 4.12  Hemoglobin (CBC) 13.2  Hematocrit (CBC) 37.4  Platelet Count (CBC)  110  MCV 91  MCH 31.9  MCHC 35.1  RDW 13.1  Neutrophil % 58.8  Lymphocyte % 34.2  Monocyte % 5.5  Eosinophil % 1.2  Basophil % 0.3  Neutrophil # 3.0  Lymphocyte # 1.8  Monocyte # 0.3  Eosinophil # 0.1  Basophil # 0.0 (Result(s) reported on 23 Sep 2013 at 05:42AM.)   Assessment/Plan:  Assessment/Plan:  Assessment 1) abnormal liver tests-marked hepatosteatosis.  ama negative, less likely h/o PBC.  other testing negative for other liver disease.  improved ast, some continued elevation of bili, indirect fx elevated, likely intrahepatic cholestasis secondary to NAFLD.  Will consider MRCP.  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  3) nausea and vomiting improved from yesterday.  intolerance to levaquin noted, will d/c cipro.  Will plan EGD for wednesday.  4) abdominal distension-colonic pseudo-obstruction likely secondary to narcotic side effects, try to limit narcs.  5) change of content of NGT with some blood noted.  Will d/c LWS, change to drainage bag. Change  protonix from oral to iv, bid for now.  EGD wednesday unless change clinically.   Plan as above   Electronic Signatures: Loistine Simas (MD)  (Signed 10-Aug-15 16:24)  Authored: Chief Complaint, Brief Assessment, Lab Results, Assessment/Plan   Last Updated: 10-Aug-15 16:24 by Loistine Simas (MD)

## 2014-06-07 NOTE — Consult Note (Signed)
PATIENT NAME:  Amber Christensen, NESTLE MR#:  [id number] DATE OF BIRTH:  11-11-1965  DATE OF CONSULTATION:  09/20/2013  REFERRING PHYSICIAN:  Dr. Volanda Napoleon CONSULTING PHYSICIAN:  Theodore Demark, NP  Consult was ordered by Dr. Volanda Napoleon for evaluation of hepatitis and pancreatitis acute flare.   HISTORY OF PRESENT ILLNESS: Appreciate consult for pleasant 49 year old Caucasian woman with complicated history of GI issues for evaluation of chronic pancreatitis and elevated LFTs. History significant for questionable primary biliary cirrhosis, questionable hepatitis C, possibly spontaneously cleared, HAV, biliary pancreatitis worse after ERCP last year, became necrotizing and subsequently underwent partial pancreatectomy at [facility] in [place]. Her GI providers were Dr. Tacey Ruiz and Dr. Lyndel Safe. Had a complicated course including PICC line placement, TPN therapy, recurrent electrolyte abnormalities. Did follow with the pain clinic for a while. Says she was on ursodiol for the PBC for a while, but she had a reaction to this, so she is no longer on this medication. She does take Creon 36,000 units 2 capsules with meals and 1 with snack currently. For this episode, over the last week, she developed increasing left upper quadrant pain radiating to her back associated with nausea and vomiting. She is feeling better now after pain medication, hydration and antiemetic therapy. States some of the left upper quadrant pain persists and she had some hot flashes today, but overall is feeling better. She has several labs pending. On exam, the patient is jaundiced. Evidently, this is not new. She also has some left upper quadrant tenderness. She also reports a history of chronic diarrhea, about 6 six stools a day, and attributes this to having had a prior colon resection in the past due to surgical complications of bladder surgery. She also reports a 70-pound weight loss over the last year. No other GI complaints. Do note some electrolyte  abnormalities today with her potassium, calcium and magnesium, for which she has received replacement therapy.    PAST MEDICAL HISTORY: Recurrent pancreatitis, likely biliary related post partial pancreatic resection in 2014, possible primary biliary cirrhosis, possible hepatitis C, history of hepatitis A infection, hypertension, insulin-dependent diabetes, tonsillectomy, cholecystectomy, hysterectomy, appendectomy, bladder repair, liver biopsy. The patient is unclear on the results of this. Multiple cystoscopies, cardiac catheterization, D and C.  ALLERGIES: PATIENT IS ALLERGIC TO CYCLOBENZAPRINE, DARVOCET, MAGNESIUM OXIDE, SOMA, MORPHINE, DEMEROL, LEVAQUIN AND ONDANSETRON.   HOME MEDICATIONS: Lantus 6 units p.o. daily, hydrochlorothiazide/lisinopril 12.5/20 mg 1 p.o. daily, Humalog 2 units with dinner, Creon pancreatic enzymes as above.   FAMILY HISTORY: No known GI family history.   SOCIAL HISTORY: Married, lives with husband. They have recently moved down to [place] from [place] due to her husband's job relocation. No tobacco. No alcohol. No illicits. Works part-time at [employer].   REVIEW OF SYSTEMS: Significant for weakness, stress, depression, and anxiety regarding her health.  MOST RECENT LABORATORY DATA: Glucose 221, BUN 7, creatinine 0.79, sodium 138, potassium 2.9, chloride 104, GFR greater than 60, calcium 6.69, lipase 58. A1c 7.4, total protein 6.3, albumin 3.5, total bilirubin 4.5, ALP 150, AST 63, ALT 133. Troponin less than 0.02. WBC 5.2, hemoglobin 12.2, hematocrit 33.9, platelet count 108,000. Red cells normocytic. Urinalysis negative. CT of the abdomen and pelvis reported as not indicating any acute problems. There was some fatty infiltration to the liver, fat in the pancreas, left kidney cyst and hysterectomy changes. She had a 3-way abdominal x-ray that had negative findings.   PHYSICAL EXAMINATION: VITAL SIGNS: Most recent: Temperature 98, pulse 73, respiratory  rate 20, blood pressure 154/87, oxygen saturation 98% on room air.  GENERAL: Pleasant woman, somewhat anxious, lying in bed in no acute distress.  HEENT: Normocephalic, atraumatic. Sclerae with mild icterus. Mucous membranes pink and moist.  NECK: Supple. No thyromegaly, adenopathy or JVD.  CHEST: S1, S2, RRR. No MRG. No appreciable edema. Respirations eupneic. Lungs clear.  ABDOMEN: Flat. Bowel sounds x4. Does have multiple well-healed scars to the abdominal wall. Noted left upper quadrant tenderness, nondistended. No other tenderness. No rebound tenderness or peritoneal signs. There is no hepatosplenomegaly or masses.  EXTREMITIES: MAEW x4. Strength 5/5. No clubbing or cyanosis.  NEUROLOGICAL: Alert, oriented x3. Cranial nerves II through XII intact. Speech clear. No facial droop.  PSYCHIATRIC: Pleasant, calm, cooperative, anxious, somewhat limited insight.   IMPRESSION AND PLAN: Acute-on-chronic abdominal pain, elevated liver function tests, history of chronic pancreatitis status post pancreatic resection 2014, questionable primary biliary cirrhosis questionable hepatitis C, questionable cirrhosis, diarrhea and weight loss. This could go with chronic pancreatitis, as well. Currently she is feeling better. Would continue current therapy. Will attempt to clarify her diagnoses. AMA, ASMA, ANA, HCV, HBV, HAV tests. Ammonia, liver panel, PT-INR. For now. Abdominal ultrasound with portal, splenic, and hepatic Dopplers. Patient did have some of her medical records for me to review, which was appreciated. These are sent from [facility] in [place], [place] and were from March to August of last year.   Thank you very much for this consult. These services were provided by Stephens November, MSN, [facility], in collaboration with Loistine Simas, MD, with whom I discussed this patient in full.    ____________________________ Theodore Demark, NP chl:lm D: 09/20/2013 18:35:33 ET T: 09/20/2013  19:13:17 ET JOB#: [id number]  cc: Theodore Demark, NP, <Dictator> [place] SIGNED 09/25/2013 12:54

## 2014-06-07 NOTE — Discharge Summary (Signed)
PATIENT NAME:  Amber Christensen, Amber Christensen MR#:  [id number] DATE OF BIRTH:  1965-09-14  DATE OF ADMISSION:  12/03/2013 DATE OF DISCHARGE:  12/06/2013  ADMITTING DIAGNOSIS: Abdominal pain, likely due to her chronic pancreatitis.   DISCHARGE DIAGNOSES:  1. Diffuse abdominal pain of unclear etiology.  2. Nausea.  3. Chronic diarrhea.  4. Benign hypertension.  5. Diabetes mellitus type 2, insulin-dependent.  6. Hemoglobin A1c of 7.2.   DISCHARGE CONDITION: Stable.   DISCHARGE MEDICATIONS: The patient is to continue Lantus at 6 units subcutaneously at bedtime, Creon 2 capsules 3 times daily with meals, Humalog sliding scale, oxycodone 5 mg every 6 hours as needed, meclizine 12.5 mg 3 times daily as needed, Creon 1 capsule p.o. 3 times daily with snacks, lisinopril 40 mg p.o. daily, amlodipine 10 mg p.o. daily, pantoprazole 40 mg p.o. twice daily, promethazine 25 mg every 6 hours as needed, simethicone 80 mg before meals and at bedtime.  OXYGEN: None.   DIET: 2 grams salt, low-fat, low-cholesterol, carbohydrate-controlled diet, mechanical soft. Advance to regular as tolerated.   Follow-up appointment with Dr. Vira Agar in 1 week after discharge.    CONSULTANTS: Care management, social work, Dr. Vira Agar, gastroenterologist.   RADIOLOGIC STUDIES:  1. Chest x-ray, portable single view, 12/03/2013: Showed no acute cardiopulmonary findings.  2. CT scan of abdomen and pelvis with contrast: No acute abdominal or pelvic findings demonstrated, stable postsurgical changes status post apparent distal pancreatectomy, cholecystectomy, ileocolonic anastomosis, and hysterectomy. There is minimal residual pancreatic tissue. This may predispose the patient to pancreatic insufficiency. Improved hepatic steatosis. Probable chronic splenic vein thrombosis with splenic varices.  3. Abdomen: AP only ion 12/04/2013: Showed no bowel obstruction. Contrast was noted throughout a nondistended colon.   HISTORY OF PRESENT ILLNESS: The  patient is a 49 year old Caucasian female with past medical history significant for history of multiple medical problems including multiple surgeries who presents to the hospital on 12/03/2013 with complaints of abdominal pain. Please refer to Dr. Lianne Moris admission note on 12/03/2013. On arrival to the hospital, the patient's temperature was 98, pulse was 75, respiration rate was 20, blood pressure 160/33, oxygen saturation was at 100% on room air. Physical exam revealed tenderness in the mid-part of abdomen, but not rigidity. No rebound. Bowel sounds are present. No obvious organomegaly was also noted. The patient's lab data done on arrival to the hospital: Her glucose level of 296; otherwise, BMP was normal. The patient's calcium level was 7.6, lipase level was 25. Liver enzymes: Total protein was 6.3. Albumin level of 3.5, total bilirubin was 5, alkaline phosphatase 117. Troponin was less than 0.02 x3. CBC: White blood cell count was 4.5, hemoglobin was 12.7, platelet count was 141,000. Coagulation panel was normal. Urinalysis was unremarkable. EKG showed normal sinus rhythm at 76 beats per minute, normal axis, no acute ST-T changes were noted. The patient's CT scan of abdomen and pelvis with contrast was unremarkable. The patient admitted to the hospital for further evaluation. She was started on clear liquid diet, IV fluids, pain medications and consultation with gastroenterologist was obtained. Dr. Vira Agar saw the patient in consultation and felt that the patient possibly had partial small bowel obstruction, with adhesions causing her pain. It was recommended conservative therapy. The patient was continued on conservative therapy and slowly improved. She was able to tolerate soft diet. By the day of discharge, she was able to eat 75% of her meals on 12/06/2013. Her vital signs were stable with temperature of 98.3, pulse was 83, respiration was 16, blood  pressure 122/84, saturation was 96% on room air at rest.  It was felt that the patient is okay to be discharged home. She is to follow up with gastroenterologist in the next few days after discharge.   TIME SPENT: 40 minutes.    ____________________________ Theodoro Grist, MD rv:lm D: 12/07/2013 21:09:23 ET T: 12/08/2013 15:08:07 ET JOB#: [id number]  cc: Theodoro Grist, MD, <Dictator> Manya Silvas, MD Wallace MD ELECTRONICALLY SIGNED 12/13/2013 11:43

## 2014-06-07 NOTE — Consult Note (Signed)
PATIENT NAME:  Amber Christensen, STRUVE MR#:  [id number] DATE OF BIRTH:  1965/02/15  DATE OF CONSULTATION:  09/19/2013  PRIMARY CARE PHYSICIAN:  [facility].  CONSULTING PHYSICIAN:  Earleen Newport. Volanda Napoleon, MD  REFERRING EMERGENCY ROOM PHYSICIAN:  Desiree Lucy. Jasmine December, MD.  CHIEF COMPLAINT: Abdominal pain.   HISTORY OF PRESENT ILLNESS: This pleasant 49 year old female who has just moved to [place] from [place] who has a past medical history of recurrent pancreatitis in her 68s and is status post partial pancreatic resection in 2014, presents with abdominal pain and vomiting similar to prior episodes of pancreatitis. She reports that she has been having worsening abdominal pain over the past week, which became acutely worse today. Today, she also developed intractable vomiting, vomiting many times to the point of clear bilious emesis. She has had no hematemesis. She has not had any diarrhea. She does have normal bowel movements, she feels she may have seen some blood streaks in the most recent bowel movement. She has had no fevers, chills, sweats.   PAST MEDICAL HISTORY:   1.  Recurrent pancreatitis starting at age 20, necrotic pancreatitis status post partial pancreatic resection in 2014.  2.  Biliary cirrhosis.  3.  Depression.  4.  Chronic hepatitis.  5.  Hypertension.  6.  Insulin-dependent diabetes mellitus.   PAST SURGICAL HISTORY: 1.  Tonsillectomy.  2.  Cholecystectomy.  3.  Hysterectomy.  4.  Appendectomy.  5.  Bladder repair.  6.  Partial pancreatectomy.  7.  Liver biopsy.  8.  Multiple cystoscopies.  9.  Cardiac catheterization.  10.  D and C.  11.  The patient reports history of masses in the breast, kidney, bladder, possibly malignant.   ALLERGIES: THE PATIENT IS ALLERGIC TO CYCLOBENZAPRINE, PROPOXYPHENE, MAGNESIUM OXIDE, SOMA, MORPHINE, DEMEROL, LEVAQUIN, ONDANSETRON.   HOME MEDICATIONS:  1.  Lantus 6 units once a day.  2.  Hydrochlorothiazide/lisinopril 12/20 mg 1 tablet  daily.  3.  Humalog 2 units with dinner.   SOCIAL HISTORY: The patient has recently moved to [place] from [place] state. She is married and lives with her husband. She denies smoking cigarettes. She states that she drinks alcohol very rarely. Denies illicit drug use. She works part time at [employer].   FAMILY HISTORY:  No family history of pancreatitis.   REVIEW OF SYSTEMS: CONSTITUTIONAL: Positive for weakness and pain, negative for fevers, chills, weight change.  HEENT: Negative for eye pain or change in vision, no change in hearing, no ear pain, no mouth pain, no oral ulcers. No difficulty swallowing.  RESPIRATORY: No cough, wheezing, dyspnea.  CARDIOVASCULAR: No chest pain, dyspnea on exertion, palpitation, syncope.  GASTROINTESTINAL: Positive for nausea, vomiting, abdominal pain, negative for diarrhea. Positive hematochezia. No hematemesis.  GENITOURINARY: Negative for dysuria or frequency.  MUSCULOSKELETAL: Negative for trauma, joint pain, muscular pain, weakness.  NEUROLOGIC: Negative for numbness, weakness, dysarthria, migraine or seizure.  PSYCHIATRIC: Positive for recent stress.   PHYSICAL EXAMINATION:  VITAL SIGNS: Temperature 98, pulse 86, respirations 21, systolic blood pressure 720 over diastolic 947, pulse oximetry 100% on room air.  GENERAL: The patient is alert, oriented, lying comfortably in the bed.   HEENT: Pupils are small, reactive, equal, conjunctivae are clear. There is mild icterus. No nasal drainage or lesion.  Oropharynx is clear.  Mucous membranes are moist. Trachea is midline.  NECK: Supple. RESPIRATORY:  Lungs are clear to auscultation bilaterally with good air movement.  CARDIOVASCULAR:  Regular rate and rhythm, no murmurs, rubs, or gallops.  ABDOMEN: Bowel sounds are normal, abdomen is soft, slightly tender, worse in the left lower quadrant, no distention, no mass, no hepatosplenomegaly.  EXTREMITIES: There is no edema, peripheral pulses are 2+.   MUSCULOSKELETAL: There are no swollen or tender joints. Range of motion is full, strength is 4/4.  NEUROLOGIC: Cranial nerves II through XII are grossly intact. Neurologic exam is nonfocal.  PSYCHIATRIC: The patient is   DICTATION ENDS HERE (Dictation Anomaly) <<MISSING TEXT>>    ____________________________ Earleen Newport. Volanda Napoleon, MD cpw:ds D: 09/19/2013 21:43:25 ET T: 09/19/2013 21:53:20 ET JOB#: [id number]  cc: Barnetta Chapel P. Volanda Napoleon, MD, <Dictator>

## 2014-06-07 NOTE — H&P (Signed)
PATIENT NAME:  Amber Christensen, ALARID MR#:  [id number] DATE OF BIRTH:  1965/12/04  DATE OF CONSULTATION:  09/19/2013  PRIMARY CARE PHYSICIAN:  [facility].  CONSULTING PHYSICIAN:  Earleen Newport. Volanda Napoleon, MD  REFERRING EMERGENCY ROOM PHYSICIAN:  Desiree Lucy. Jasmine December, MD.  CHIEF COMPLAINT: Abdominal pain.   HISTORY OF PRESENT ILLNESS: This pleasant 49 year old female who has just moved to [place] from [place] who has a past medical history of recurrent pancreatitis in her 69s and is status post necrotic pancreatitis and partial pancreatic resection in 2014, presents with abdominal pain and vomiting similar to prior episodes of pancreatitis. She reports that she has been having worsening abdominal pain over the past week, which became acutely worse today. Today, she also developed intractable vomiting, to the point of clear/bilious emesis. She has had no hematemesis. She has not had any diarrhea. She does have normal bowel movements, she feels she may have seen some blood streaks in the most recent bowel movement. She has had no fevers, chills, sweats.   PAST MEDICAL HISTORY:  (history obtained from patient report awating records) 1.  Recurrent non alcoholic pancreatitis starting at age 31, necrotic pancreatitis status post partial pancreatic resection in 2014.  2.  Biliary cirrhosis.  3.  Depression.  4.  Chronic hepatitis.  5.  Hypertension.  6.  Insulin-dependent diabetes mellitus.  7. masses of the breast, kidney and bladder which she thinks could be malignant  PAST SURGICAL HISTORY: 1.  Tonsillectomy.  2.  Cholecystectomy.  3.  Hysterectomy.  4.  Appendectomy.  5.  Bladder repair.  6.  Partial pancreatectomy.  7.  Liver biopsy.  8.  Multiple cystoscopies.  9.  Cardiac catheterization.  10.  D and C.   ALLERGIES: THE PATIENT IS ALLERGIC TO CYCLOBENZAPRINE, PROPOXYPHENE, MAGNESIUM OXIDE, SOMA, MORPHINE, DEMEROL, LEVAQUIN, ONDANSETRON.   HOME MEDICATIONS:  1.  Lantus 6 units once a  day.  2.  Hydrochlorothiazide/lisinopril 12/20 mg 1 tablet daily.  3.  Humalog 2 units with dinner.   SOCIAL HISTORY: The patient has recently moved to [place] from [place] state. She is married and lives with her husband. She denies smoking cigarettes. She states that she drinks alcohol very rarely. Denies illicit drug use. She works part time at [employer].   FAMILY HISTORY:  No family history of pancreatitis.   REVIEW OF SYSTEMS: CONSTITUTIONAL: Positive for weakness and pain, negative for fevers, chills, weight change.  HEENT: Negative for eye pain or change in vision, no change in hearing, no ear pain, no mouth pain, no oral ulcers. No difficulty swallowing.  RESPIRATORY: No cough, wheezing, dyspnea.  CARDIOVASCULAR: No chest pain, dyspnea on exertion, palpitation, syncope.  GASTROINTESTINAL: Positive for nausea, vomiting, abdominal pain, negative for diarrhea. Positive hematochezia. No hematemesis.  GENITOURINARY: Negative for dysuria or frequency.  MUSCULOSKELETAL: Negative for trauma, joint pain, muscular pain, weakness.  NEUROLOGIC: Negative for numbness, weakness, dysarthria, migraine or seizure.  PSYCHIATRIC: Positive for recent stress.   PHYSICAL EXAMINATION:  VITAL SIGNS: Temperature 98, pulse 86, respirations 21, systolic blood pressure 449 over diastolic 201, pulse oximetry 100% on room air.  GENERAL: The patient is alert, oriented, lying comfortably in the bed.   HEENT: Pupils are small, reactive, equal, conjunctivae are clear. There is mild icterus. No nasal drainage or lesion.  Oropharynx is clear.  Mucous membranes are moist. Trachea is midline.  NECK: Supple. RESPIRATORY:  Lungs are clear to auscultation bilaterally with good air movement.  CARDIOVASCULAR:  Regular rate and rhythm,  no murmurs, rubs, or gallops.  ABDOMEN: Bowel sounds are normal, abdomen is soft, slightly tender, worse in the left lower quadrant, no distention, no mass, no hepatosplenomegaly.   EXTREMITIES: There is no edema, peripheral pulses are 2+.  MUSCULOSKELETAL: There are no swollen or tender joints. Range of motion is full, strength is 4/4.  NEUROLOGIC: Cranial nerves II through XII are grossly intact. Neurologic exam is nonfocal.  PSYCHIATRIC: The patient is depressed apearing with a flat affect   please see addendum for remaining information    ____________________________ Earleen Newport. Volanda Napoleon, MD cpw:ds D: 09/19/2013 21:43:25 ET T: 09/19/2013 21:53:20 ET JOB#: [id number]  cc: Barnetta Chapel P. Volanda Napoleon, MD, <Dictator> Aldean Jewett MD ELECTRONICALLY SIGNED 09/28/2013 13:56

## 2014-06-07 NOTE — Consult Note (Signed)
Chief Complaint:  Subjective/Chief Complaint patietn seen for abnormal lfts.  today patient with abdominal distension, increased pain from yesterday, bladder dietension with retained urine, difficulty urinating.  recurrent n/v of bilious material.   VITAL SIGNS/ANCILLARY NOTES: **Vital Signs.:   09-Aug-15 04:00  Vital Signs Type Routine  Temperature Temperature (F) 98.3  Celsius 36.8  Pulse Pulse 55  Respirations Respirations 20  Systolic BP Systolic BP 053  Diastolic BP (mmHg) Diastolic BP (mmHg) 74  Mean BP 86  Pulse Ox % Pulse Ox % 97  Pulse Ox Activity Level  At rest  Oxygen Delivery Room Air/ 21 %    15:06  Vital Signs Type Routine  Temperature Temperature (F) 97.9  Celsius 36.6  Temperature Source oral  Pulse Pulse 86  Respirations Respirations 20  Systolic BP Systolic BP 976  Diastolic BP (mmHg) Diastolic BP (mmHg) 97  Mean BP 116  Pulse Ox % Pulse Ox % 95  Pulse Ox Activity Level  At rest  Oxygen Delivery Room Air/ 21 %  *Intake and Output.:   09-Aug-15 00:16  Stool  large liquid brown stool    05:29  Stool  large loose liquid stool    10:36  Stool  small loose stool.    11:57  Stool  small loose stool.    14:51  Urine ml     Out:  900    15:59  Stool  loose yellow diarrhea 200 ml   Brief Assessment:  Cardiac Regular  tachycardia mild   Respiratory clear BS   Gastrointestinal details normal distended, generalized tenderness, no rebound, bs  rushes/tinkles   Lab Results: Hepatic:  06-Aug-15 17:46   Bilirubin, Total  4.5  Alkaline Phosphatase  150 (46-116 NOTE: New Reference Range 09/03/13)  SGPT (ALT)  133 (14-63 NOTE: New Reference Range 09/03/13)  SGOT (AST)  63  Total Protein, Serum  6.3  Albumin, Serum 3.5  07-Aug-15 20:12   Bilirubin, Total  5.8  Alkaline Phosphatase  149 (46-116 NOTE: New Reference Range 09/03/13)  SGPT (ALT)  114 (14-63 NOTE: New Reference Range 09/03/13)  SGOT (AST)  59  Total Protein, Serum  6.2  Albumin,  Serum 3.6  Bilirubin, Direct  0.5 (Result(s) reported on 20 Sep 2013 at 09:04PM.)  08-Aug-15 04:51   Bilirubin, Total  5.6  Alkaline Phosphatase  141 (46-116 NOTE: New Reference Range 09/03/13)  SGPT (ALT)  101 (14-63 NOTE: New Reference Range 09/03/13)  SGOT (AST)  55  Total Protein, Serum  5.7  Albumin, Serum  3.2  09-Aug-15 05:41   Bilirubin, Total  5.2  Alkaline Phosphatase  166 (46-116 NOTE: New Reference Range 09/03/13)  SGPT (ALT)  209 (14-63 NOTE: New Reference Range 09/03/13)  SGOT (AST)  238  Total Protein, Serum  5.7  Albumin, Serum  3.3  General Ref:  07-Aug-15 20:12   Mitochondrial (M2) Antibody ========== TEST NAME ==========  ========= RESULTS =========  = REFERENCE RANGE =  MITOCHONDRIAL (M2) AB  Mitochondrial (M2) Antibody Mitochondrial (M2) Antibody     [   9.2 Units            ]          0.0-20.0              Negative    0.0 - 20.0  Equivocal  20.1 - 24.9                                                Positive         >24.9                                                                    .              Mitochondrial (M2) Antibodies are found in 90-96% of                patients with primary biliary cirrhosis.               [facility]            No: [id number]           [address] [place], [place] [address] Lindon Romp, MD         [phone]   Result(s) reported on 22 Sep 2013 at 05:18PM.  Anti-Smooth Muscle Antibody ========== TEST NAME ==========  ========= RESULTS =========  = REFERENCE RANGE =  ANTI-SMOOTH MUSCLE ABS  Actin (Smooth Muscle) Antibody Actin (Smooth Muscle) Antibody  [   12 Units             ]              0-19   Negative                     0 - 19                                 Weak positive               20 - 30                                 Moderate to strong positive     >30                                                                     .               Actin Antibodies are found in 52-85% of patients with                 autoimmune hepatitis or chronic active hepatitis and                 in 22% of patients with primary biliary cirrhosis.               [facility] No: [id number]           [address] [place], [place] [address]  Lindon Romp, MD         [phone]   Result(s) reported on 22 Sep 2013 at 05:18PM.  ANA Comprehensive Panel ========== TEST NAME ==========  ========= RESULTS =========  = REFERENCE RANGE =  ANA COMPREHENSIVE PANEL  ANA Comprehensive Panel Anti-DNA (DS) Ab Qn             [   Result Pending       ]                   RNP Antibodies                  [Result Pending       ]                   Smith Antibodies                [   Result Pending       ]                   Antiscleroderma-70 Antibodies   [   Result Pending       ]                   Sjogren's Anti-SS-A             [   Result Pending    ]                   Sjogren's Anti-SS-B             [   Result Pending       ]                   Antichromatin Antibodies        [   Result Pending       ]                   Anti-Jo-1                       [   Result Pending       ]       Anti-Centromere B Antibodies    [   Result Pending       ]                   See below:                      [   Final Report         ]                   Autoantibody                       Disease Association ------------------------------------------------------------                         Condition                  Frequency ---------------------   ------------------------   --------- Antinuclear Antibody,    SLE, mixed connective Direct (ANA-D)           tissue diseases ---------------------   ------------------------   --------- dsDNA  SLE                        40 - 60% ---------------------   ------------------------   --------- Chromatin                Drug induced SLE                90%                 SLE                         48 - 97% ---------------------   ------------------------   --------- SSA (Ro)                 SLE                        25 - 35%                          Sjogren's Syndrome         40 - 70%              Neonatal Lupus                 100% ---------------------   ------------------------   --------- SSB (La)                 SLE                             10%                          Sjogren's Syndrome              30% ---------------------   -----------------------    --------- Sm (anti-Smith)          SLE                        15 - 30% ---------------------   -----------------------    --------- RNP                      Mixed Connective Tissue                          Disease                         95% (U1 nRNP,                SLE                        30 - 50% anti-ribonucleoprotein)  Polymyositis and/or                          Dermatomyositis                 20% ---------------------   ------------------------  --------- Scl-70 (antiDNA          Scleroderma (diffuse)      20 - 35% topoisomerase)           Crest  13% ---------------------   ------------------------   --------- Jo-1                     Polymyositis and/or                    Dermatomyositis            20 - 40% ---------------------   ------------------------   --------- Centromere B             Scleroderma - Crest                          variant                         80%               [facility]            No: [id number]           [address], [place], [place] [address]           Lindon Romp, MD         [phone]   Result(s) reported on 22 Sep 2013 at 05:18PM.  Hepatitis C Virus Antibody ========== TEST NAME ==========  ========= RESULTS =========  = REFERENCE RANGE =  HCV ANTIBODY  HCV Antibody Hep C Virus Ab                  [   Result Pending       ]                                 [facility]            No: [id number]           [address], [place], [place] [address]           Lindon Romp, MD         [phone]   Result(s) reported on 22 Sep 2013 at 05:18PM.  Hepatitis B Surface Antibody, Qual ========== TEST NAME ==========  ========= RESULTS =========  = REFERENCE RANGE =  HEPATITIS B SURF.AB,QUAL  Hep B Surface Ab Hep B Surface Ab, Qual          [   Result Pending       ]                                 [facility]    No: [id number]           [address], [place],  [address]           Lindon Romp, MD         [phone]   Result(s) reported on 22 Sep 2013 at 05:18PM.  Hepatitis B Core Antibody Total ========== TEST NAME ==========  ========= RESULTS =========  = REFERENCE RANGE =  HEPATITIS B CORE AB,TOTL  Hep B Core Ab, Tot Hep B Core Ab, Tot              [   Result Pending       ]  [facility]      No: [id number]           [address] [place], [place] [address]           Lindon Romp, MD         [phone]   Result(s) reported on 22 Sep 2013 at 05:18PM.  Hepatitis A Antibody, Total ========== TEST NAME ==========  ========= RESULTS =========  = REFERENCE RANGE =  HEPATITIS A ANTIBDY,TOTL  Hep A Ab, Total Hep A Ab, Total                 [   Result Pending       ]                                 [facility]   No: [id number]           [address], [place], [place] [address]           Lindon Romp, MD         [phone]   Result(s) reported on 22 Sep 2013 at 05:18PM.  Routine Chem:  06-Aug-15 17:46   Lipase  58 (Result(s) reported on 19 Sep 2013 at 06:05PM.)  07-Aug-15 20:12   Ammonia, Plasma  50 (Result(s) reported on 20 Sep 2013 at 08:52PM.)  08-Aug-15 04:51   Lipase  34 (Result(s) reported on 21 Sep 2013 at 05:42AM.)  09-Aug-15 05:41   Lipase  33 (Result(s) reported on 22 Sep 2013 at 02:53PM.)  Glucose, Serum  198  BUN  4  Creatinine (comp) 0.76  Sodium, Serum 139  Potassium,  Serum 4.5  Chloride, Serum 105  CO2, Serum 26  Calcium (Total), Serum  7.9  Osmolality (calc) 280  eGFR (African American) >60  eGFR (Non-African American) >60 (eGFR values <21m/min/1.73 m2 may be an indication of chronic kidney disease (CKD). Calculated eGFR is useful in patients with stable renal function. The eGFR calculation will not be reliable in acutely ill patients when serum creatinine is changing rapidly. It is not useful in  patients on dialysis. The eGFR calculation may not be applicable to patients at the low and high extremes of body sizes, pregnant women, and vegetarians.)  Anion Gap 8  Routine Hem:  09-Aug-15 05:41   WBC (CBC) 4.4  RBC (CBC)  3.79  Hemoglobin (CBC) 12.0  Hematocrit (CBC)  34.3  Platelet Count (CBC)  111  MCV 91  MCH 31.7  MCHC 35.0  RDW 13.1  Neutrophil % 54.0  Lymphocyte % 38.2  Monocyte % 5.8  Eosinophil % 1.4  Basophil % 0.6  Neutrophil # 2.4  Lymphocyte # 1.7  Monocyte # 0.3  Eosinophil # 0.1  Basophil # 0.0 (Result(s) reported on 22 Sep 2013 at 05:58AM.)   Radiology Results: XRay:    08-Aug-15 15:54, Abdomen Flat and Erect  Abdomen Flat and Erect   REASON FOR EXAM:    abdominal pain, h/o chronic pancreatitis  COMMENTS:       PROCEDURE: DXR - DXR ABDOMEN 2 V FLAT AND ERECT  - Sep 21 2013  3:54PM     CLINICAL DATA:  Abdominal pain, chronic pancreatitis    EXAM:  ABDOMEN - 2 VIEW    COMPARISON:  CT abdomen pelvis dated 09/19/2013    FINDINGS:  Nonobstructive bowel gas pattern.  No evidence of free air under the diaphragm on the upright view.  Mild to moderate colonic stool burden.    Cholecystectomy clips.     IMPRESSION:  No evidence of small bowel obstruction or free air.      Electronically Signed    By: Julian Hy M.D.    On: 09/21/2013 16:19       Verified By: Julian Hy, M.D.,    09-Aug-15 14:17, Abdomen Flat and Erect  Abdomen Flat and Erect   REASON FOR EXAM:    abdominal pain,  distension, n/v  COMMENTS:       PROCEDURE: DXR - DXR ABDOMEN 2 V FLAT AND ERECT  - Sep 22 2013  2:17PM     CLINICAL DATA:  Abdominal bloating/pain, distention, nausea/vomiting    EXAM:  ABDOMEN - 2 VIEW    COMPARISON:  09/21/2013    FINDINGS:  Mild of bowel gas pattern.  Multiple distended loops of colon.    Surgical sutures in the right mid abdomen.    Cholecystectomy clips.    No evidence of free air under the diaphragm on the upright view.     IMPRESSION:  No evidence of small bowel obstruction or free air.    Multiple dilated loops of colon, suggesting adynamic colonic ileus.      Electronically Signed    By: Julian Hy M.D.    On: 09/22/2013 14:35         Verified By: Julian Hy, M.D.,    09-Aug-15 16:59, Abdomen AP Only  Abdomen AP Only   REASON FOR EXAM:    NG tube placement  COMMENTS:   Bedside (portable):Y    PROCEDURE: DXR - DXR ABDOMEN AP ONLY  - Sep 22 2013  4:59PM     CLINICAL DATA:  Nasogastric tube placement.    EXAM:  ABDOMEN - 1 VIEW    COMPARISON:  Abdominal radiograph 09/21/2013.    FINDINGS:  Gaseous distention throughout the colon. No definite pathologic  dilatation of small gas. Distal rectal gas is noted. A suture line  noted in the right mid abdomen. Surgical clips in the right upper  quadrant of the abdomen, compatible with prior cholecystectomy.  Nasogastric tube tip in the proximal stomach. No gross  pneumoperitoneum on this single supine view of the abdomen.     IMPRESSION:  1. Nonspecific, nonobstructive bowel gas pattern, as above.  2. Postoperative changes and support apparatus, as above.      Electronically Signed    By: Vinnie Langton M.D.    On: 09/22/2013 17:04       Verified By: Etheleen Mayhew, M.D.,  [place]:    08-Aug-15 09:25, US Abdomen General Survey  US Abdomen General Survey   REASON FOR EXAM:    elevated lfts, ?cirrhosis. please dopple portal,   splenic, and hepatic veins  COMMENTS:        PROCEDURE: [place]  - US ABDOMEN GENERAL SURVEY  - Sep 21 2013  9:25AM     CLINICAL DATA:  Elevated liver function tests and hepatic steatosis  by CT. Prior cholecystectomy and partial pancreatectomy. Epigastric  abdominal pain.    EXAM:  ULTRASOUND ABDOMEN COMPLETE    COMPARISON:  CT of the abdomen on 09/19/2013  FINDINGS:  Gallbladder:    Surgically absent.    Common bile duct:    Diameter: Maximum measured diameter of 7 mm.    Liver:    The liver demonstrates coarse echotexture and markedly increased  echogenicity, reflecting severe steatosis. No overt cirrhotic  contour  abnormalities or focal lesions are identified. There is no  evidence of intrahepatic biliary ductal dilatation. The portal vein  is open.    IVC:    No abnormality visualized.    Pancreas:    Very poorly visualized.    Spleen:    The spleen is nonenlarged.  Estimated splenic volume is 299 mL.    Right Kidney:  Length: 12.1 cm. Echogenicity within normal limits. No mass or  hydronephrosis visualized.    Left Kidney:    Length: 12.2 cm. 1.4 cm cyst demonstrates mildly complex features  with peripheral echogenicity and calcification present. Echogenicity  of the kidney is within normal limits. No hydronephrosis visualized.    Abdominal aorta:    No aneurysm visualized.    Other findings:  No ascites.     IMPRESSION:  Marked hepatic steatosis. No evidence of hepatic mass or biliary  ductal dilatation by ultrasound. No ascites.      Electronically Signed    By: Aletta Edouard M.D.    On: 09/21/2013 10:38         Verified By: Azzie Roup, M.D.,    09-Aug-15 12:22, US Abdomen Limited Survey  US Abdomen Limited Survey   REASON FOR EXAM:    distenstion. ascitis?  COMMENTS:   Body Site: Ascites search - Abd. Quadrants imaged for free   fluid    PROCEDURE: [place]  - US ABDOMEN LIMITED SURVEY  - Sep 22 2013 12:22PM     CLINICAL DATA:  49 year old female with abdominal  distention,  vomiting. Query ascites. Initial encounter.    EXAM:  LIMITED ABDOMEN ULTRASOUND FOR ASCITES    TECHNIQUE:  Limited ultrasound survey for ascites was performed in all four  abdominal quadrants.  COMPARISON:  CT Abdomen and Pelvis 09/19/2013.    FINDINGS:  No ascites identified in the abdomen. The bladder is severely  distended (estimated volume 977 mL).     IMPRESSION:  1. No abdominal ascites.  2. Severe bladder distension, the patient advises the technologist  she has difficulty emptyingher bladder. Consider Foley catheter  placement.      Electronically Signed    By: Lars Pinks M.D.    On: 09/22/2013 13:04         Verified By: Gwenyth Bender. HALL, M.D.,   Assessment/Plan:  Assessment/Plan:  Assessment 1) history of liver disorder, per patient and records variably hep C, PBC or fatty liver.  multiple labs ordered, elevated AMA c/w history of possible pbc. Patietn not on ursodeoxycholic acid due to atypical reaction (from patient).  Imaging c/w severe steatohepatitis, hep b and c serologies still pending.   lfts were stable to slow improving until this am, with moderate increase of transaminases.  May need to have liver bx, has had many in the past.  2) new colonic pseudoobstruction-distension not to level of needing decompression, but with rapid occurance since yesterday-multiple loose bm noted today, had been on lactulose, now dc'd.  My impression is narcotic related.  3) history of chronic pancreatitis, with severe pancreatitis complication in 06/537 following ERCP leading to pancreatic debridement.  patient on pancreatic enzyme replacement.  Lipase levels lower than on admission but not elevated since admission (chronic history of pancreatic debridement).  4) history of bladder disorderfollowing repair and multiple cystoscopies   Plan as above, continue supportive care. recheck labs in am including met b lfts pt, cbc.  I have requested a consult in regard to colonic  pseudoobstruction from gen surgery.   Electronic  Signatures: Loistine Simas (MD)  (Signed 09-Aug-15 17:50)  Authored: Chief Complaint, VITAL SIGNS/ANCILLARY NOTES, Brief Assessment, Lab Results, Radiology Results, Assessment/Plan   Last Updated: 09-Aug-15 17:50 by Loistine Simas (MD)

## 2014-06-07 NOTE — Consult Note (Signed)
VITAL SIGNS/ANCILLARY NOTES: **Vital Signs.:   12-Aug-15 13:02  Vital Signs Type Routine  Temperature Temperature (F) 98.2  Celsius 36.7  Temperature Source oral  Pulse Pulse 69  Respirations Respirations 20  Systolic BP Systolic BP 161  Diastolic BP (mmHg) Diastolic BP (mmHg) 82  Mean BP 98  Pulse Ox % Pulse Ox % 96  Pulse Ox Activity Level  At rest  Oxygen Delivery Room Air/ 21 %  *Intake and Output.:   12-Aug-15 09:56  Stool  Large brown mucus like bowel movement.   Brief Assessment:  Cardiac Regular   Respiratory clear BS  ocasional mild wheeze   Gastrointestinal details normal Soft  Nondistended  Bowel sounds normal  No rebound tenderness  mild diffuse tenderness   Lab Results: Hepatic:  12-Aug-15 04:01   Bilirubin, Total  5.7  Bilirubin, Direct  0.5 (Result(s) reported on 25 Sep 2013 at 04:35AM.)  Alkaline Phosphatase  145 (46-116 NOTE: New Reference Range 09/03/13)  SGPT (ALT)  105 (14-63 NOTE: New Reference Range 09/03/13)  SGOT (AST)  56  Total Protein, Serum  6.0  Albumin, Serum  3.0  General Ref:  07-Aug-15 20:12   Mitochondrial (M2) Antibody ========== TEST NAME ==========  ========= RESULTS =========  = REFERENCE RANGE =  MITOCHONDRIAL (M2) AB  Mitochondrial (M2) Antibody Mitochondrial (M2) Antibody     [   9.2 Units            ]          0.0-20.0              Negative    0.0 - 20.0                                                Equivocal  20.1 - 24.9                                                Positive         >24.9                                                                    .              Mitochondrial (M2) Antibodies are found in 90-96% of                patients with primary biliary cirrhosis.               [facility]            No: [id number]           [address] [place], [place] [address] Lindon Romp, MD         [phone]   Result(s) reported on 23 Sep 2013 at 01:50PM.  Anti-Smooth Muscle Antibody  ========== TEST NAME ==========  ========= RESULTS =========  = REFERENCE RANGE =  ANTI-SMOOTH MUSCLE ABS  Actin (Smooth Muscle) Antibody Actin (Smooth Muscle) Antibody  [  12 Units             ]              0-19   Negative                     0 - 19                                 Weak positive               20 - 30                                 Moderate to strong positive     >30                                                                     .              Actin Antibodies are found in 52-85% of patients with                 autoimmune hepatitis or chronic active hepatitis and                 in 22% of patients with primary biliary cirrhosis.               [facility] No: [id number]           [address] [place], [place] [address]           Lindon Romp, MD         [phone]   Result(s) reported on 23 Sep 2013 at 01:50PM.  ANA Comprehensive Panel ========== TEST NAME ==========  ========= RESULTS =========  = REFERENCE RANGE =  ANA COMPREHENSIVE PANEL  ANA Comprehensive Panel Anti-DNA (DS) Ab Qn             [   <1 IU/mL             ]               0-9               Negative      <5                                                   Equivocal  5 - 9                                                   Positive      >9 RNP Antibodies                  [   <0.2 AI              ]  0.0-0.9 Smith Antibodies                [   <0.2 AI              ]           0.0-0.9 Antiscleroderma-70 Antibodies   [   <0.2 AI              ]           0.0-0.9 Sjogren's Anti-SS-A             [   <0.2 AI              ]           0.0-0.9 Sjogren's Anti-SS-B          [   <0.2 AI              ]           0.0-0.9 Antichromatin Antibodies        [   <0.2 AI              ]           0.0-0.9 Anti-Jo-1                       [   <0.2 AI              ]           0.0-0.9 Anti-Centromere B Antibodies    [   <0.2 AI              ]           0.0-0.9 See below:                      [    Final Report         ]                   Autoantibody                       Disease Association ------------------------------------------------------------         Condition                  Frequency ---------------------   ------------------------   --------- Antinuclear Antibody,    SLE, mixed connective Direct (ANA-D)           tissue diseases ---------------------   ------------------------   --------- dsDNA                    SLE                        40 - 60% ---------------------   ------------------------   --------- Chromatin                Drug induced SLE                90%                          SLE                        48 - 97% ---------------------   ------------------------   --------- SSA (Ro)  SLE                        25 - 35%                          Sjogren's Syndrome         40 - 70%                          Neonatal Lupus                 100% ---------------------   ------------------------   --------- SSB (La)                 SLE                             10%                          Sjogren's Syndrome              30% ---------------------   -----------------------    --------- Sm (anti-Smith)          SLE                        15 - 30% ---------------------   -----------------------    --------- RNP                      Mixed Connective Tissue                          Disease                         95% (U1 nRNP,          SLE                        30 - 50% anti-ribonucleoprotein)  Polymyositis and/or                          Dermatomyositis                 20% ---------------------   ------------------------   --------- Scl-70 (antiDNA          Scleroderma (diffuse)      20 - 35% topoisomerase)           Crest                           13% ---------------------   ------------------------   --------- Jo-1                     Polymyositis and/or                          Dermatomyositis            20 - 40% ---------------------    ------------------------   --------- Centromere B             Scleroderma - Crest  variant                         80%               [facility]            No: [id number]  [address], [place], [place] [address]           Lindon Romp, MD         [phone]   Result(s) reported on 23 Sep 2013 at 01:50PM.  Hepatitis C Virus Antibody ========== TEST NAME ==========  ========= RESULTS =========  = REFERENCE RANGE =  HCV ANTIBODY  HCV Antibody Hep C Virus Ab                  [   <0.1 s/co ratio      ]           0.0-0.9                                                  Negative:     < 0.8                                             Indeterminate: 0.8 - 0.9                                                  Positive:     > 0.9                                                                      .                  In order to reduce the incidence of a false positive                  result, the CDC recommends that all s/co ratios                  between 1.0 and 10.9 be confirmed by a more specific                  supplemental or PCR testing. [facility] offers HCV Ab                 w/Reflex to Verification test [phone].               [facility]            No: [id number]           [address], [place], [place] [address]           Lindon Romp, MD         [phone]   Result(s) reportedon 23 Sep 2013 at 01:50PM.  Hepatitis B Surface Antibody,  Qual ========== TEST NAME ==========  ========= RESULTS =========  = REFERENCE RANGE =  HEPATITIS B SURF.AB,QUAL  Hep B Surface Ab Hep B Surface Ab, Qual          [   Non Reactive         ]                                                Non Reactive: Inconsistent with immunity,                                            less than 10 mIU/mL                              Reactive:     Consistent with immunity,                                            greater than 9.9 mIU/mL                [facility]            No: [id number]           [address], [place], [place] [address]           Lindon Romp, MD         [phone]   Result(s) reported on 23 Sep 2013 at 01:50PM.  HBsAg ========== TEST NAME ==========  ========= RESULTS =========  = REFERENCE RANGE =  HEPATITIS B SURFACE AG  HBsAg Screen HBsAg Screen                    [   Negative             ]          Negative               [facility]            No: [id number]           [address], [place], [place] [address]           Lindon Romp, MD         [phone]   Result(s) reported on 23 Sep 2013 at 05:48AM.  Hepatitis B Core Antibody Total ========== TEST NAME ==========  ========= RESULTS =========  = REFERENCE RANGE =  HEPATITIS B CORE AB,TOTL  Hep B Core Ab, Tot Hep B Core Ab, Tot              [   Negative             ]          Negative               [facility]      No: [id number]           [address], [place], [place] [address]           Lindon Romp, [place]  Result(s) reported on 23 Sep 2013 at 01:50PM.  Hepatitis A Antibody, Total ========== TEST NAME ==========  ========= RESULTS =========  = REFERENCE RANGE =  HEPATITIS A ANTIBDY,TOTL  Hep A Ab, Total Hep A Ab, Total                 [   Negative             ]          Negative               [facility]   No: [id number]           [address], [place], [place] [address]           Lindon Romp, MD         [phone]   Result(s) reported on 23 Sep 2013 at 01:50PM.  Routine Chem:  12-Aug-15 04:01   Glucose, Serum  162  BUN  2  Creatinine (comp) 0.82  Sodium, Serum 139  Potassium, Serum  3.4  Chloride, Serum 99  CO2, Serum 31  Calcium (Total), Serum  7.8  Anion Gap 9  Osmolality (calc) 277  eGFR (African American) >60  eGFR (Non-African American) >60 (eGFR values <99m/min/1.73 m2 may be an indication of chronic kidney disease  (CKD). Calculated eGFR is useful in patients with stable renal function. The eGFR calculation will not be reliable in acutely ill patients when serum creatinine is changing rapidly. It is not useful in  patients on dialysis. The eGFR calculation may not be applicable to patients at the low and high extremes of body sizes, pregnant women, and vegetarians.)  Routine Coag:  11-Aug-15 07:27   INR 1.1 (INR reference interval applies to patients on anticoagulant therapy. A single INR therapeutic range for coumarins is not optimal for all indications; however, the suggested range for most indications is 2.0 - 3.0. Exceptions to the INR Reference Range may include: Prosthetic heart valves, acute myocardial infarction, prevention of myocardial infarction, and combinations of aspirin and anticoagulant. The need for a higher or lower target INR must be assessed individually. Reference: The Pharmacology and Management of the Vitamin K  antagonists: the seventh ACCP Conference on Antithrombotic and Thrombolytic Therapy. CHCWCB.7628Sept:126 (3suppl): [id number] A HCT value >55% may artifactually increase the PT.  In one study,  the increase was an average of 25%. Reference:  "Effect on Routine and Special Coagulation Testing Values of Citrate Anticoagulant Adjustment in Patients with High HCT Values." American Journal of Clinical Pathology 2006;126:400-405.)  Routine Hem:  12-Aug-15 04:01   WBC (CBC) 4.8  RBC (CBC) 4.10  Hemoglobin (CBC) 12.7  Hematocrit (CBC) 37.1  Platelet Count (CBC)  110  MCV 91  MCH 31.0  MCHC 34.2  RDW 12.9  Neutrophil % 48.9  Lymphocyte % 41.8  Monocyte % 7.8  Eosinophil % 1.2  Basophil % 0.3  Neutrophil # 2.3  Lymphocyte # 2.0  Monocyte # 0.4  Eosinophil # 0.1  Basophil # 0.0 (Result(s) reported on 25 Sep 2013 at 04:35AM.)   Radiology Results: XRay:    12-Aug-15 07:02, KUB - Kidney Ureter Bladder  KUB - Kidney Ureter Bladder   REASON FOR EXAM:     abdominal pain, pseudoobstruction, please compare   with previous  COMMENTS:       PROCEDURE: DXR - DXR KIDNEY URETER BLADDER  - Sep 25 2013  7:02AM     CLINICAL DATA:  Abdominal pain, pseudo obstruction, followup  EXAM:  ABDOMEN - 1 VIEW    COMPARISON:  09/24/2013    FINDINGS:  Anastomotic staple line in RIGHT mid abdomen in RIGHT pelvis.  Surgical clips RIGHT upper quadrant likely cholecystectomy.    Scattered gas within large and small bowel loops.    Minimally prominent RIGHT colon.    No evidence of bowel wall thickening or obstruction.    Gas present to rectum.    Bones unremarkable.    No urinary tract calcification.     IMPRESSION:  Nonobstructive bowel gas pattern.    Little interval change.      Electronically Signed    By: Lavonia Dana M.D.    On: 09/25/2013 08:16         Verified By: Burnetta Sabin, M.D.,   Assessment/Plan:  Assessment/Plan:  Assessment 1) abnormal lfts- evalaution c/w marked hepatosteatosis. abdominal pain improving/ lfts stable, pt nl.  likely has some amount of lft elevation at baseline.  2) h/o chronic pancreatitis, h/o ERCP with complication of pancreatic necrosis and subsequent debridement. 3) n/v abdominal pain improving.   Plan 1) EGD today.   I have discussed the risks benefits and complications to include not limited to bleeding infection perforation and sedation and she wishes to proceed.   Electronic Signatures: Loistine Simas (MD)  (Signed 12-Aug-15 13:57)  Authored: VITAL SIGNS/ANCILLARY NOTES, Brief Assessment, Lab Results, Radiology Results, Assessment/Plan   Last Updated: 12-Aug-15 13:57 by Loistine Simas (MD)

## 2014-06-07 NOTE — H&P (Signed)
PATIENT NAME:  Amber Christensen, Amber Christensen MR#:  [id number] DATE OF BIRTH:  01-28-1966  DATE OF ADMISSION:  12/03/2013  PRIMARY CARE PHYSICIAN:  Nonlocal.  REFERRING PHYSICIAN:  Loura Pardon, MD.  CHIEF COMPLAINT: Abdominal pain yesterday.   HISTORY OF PRESENT ILLNESS: A 49 year old female with a history of acute on chronic pancreatitis, chronic hepatitis, hypertension, diabetes, presented to the ED with above chief complaint. The patient is alert, awake, oriented, in no acute distress. The patient said she had chronic abdominal pain which is due to chronic pancreatitis.  The patient has chronic abdominal pain, intermittent, waxes and wanes, but the patient started to have severe abdominal pain since yesterday, which is in the middle part of her abdomen and sharp, 10/10, with radiation to the back. The patient also complains of nausea and vomiting, but no diarrhea. No melena or bloody stool. The patient came to the ED for further evaluation and patient said according to Dr. Gustavo Lah when the patient has worsening abdominal pain, she should come to the ED.  The patient's lipase is normal. A CAT scan of the pelvis is unremarkable.  ED physician, Dr. Edd Fabian, admitted the patient for abdominal pain; needs a GI consult.   PAST MEDICAL HISTORY: Recurrent non-alcoholic pancreatitis starting at age 70.  Necrotic pancreatitis status post partial pancreatic resection in 2014; cirrhosis, depression, chronic hepatitis, hypertension, diabetes. Breast, kidney and bladder masses.   PAST SURGICAL HISTORY:  SURGICAL HISTORY: Tonsillectomy, cholecystectomy, hysterectomy, appendectomy, bladder repair, partial pancreatectomy, liver biopsy, multiple cystoscopies, cardiac catheterization, D and C.   SOCIAL HISTORY: The patient denies any alcohol drinking, illicit drugs or smoking.   FAMILY HISTORY: No family history of pancreatitis.   ALLERGIES: CYCLOBENZAPRINE, DEMEROL HCL, LEVAQUIN, MAGNESIUM OXIDE, MORPHINE, ZOFRAN, PROPOXYPHENE,  SOMA.   HOME MEDICATIONS: Protonix 40 mg p.o. daily, oxycodone 5 mg p.o. every 6 hours p.r.n., Zofran 4 mg p.o. every 8 hours p.r.n., meclizine 12.5 mg p.o. t.i.d., lisinopril 10 mg p.o. daily, Lantus 6 units subcutaneously once a day at bedtime, Humalog sliding scale 3 times a day p.r.n., Creon 36,000 units/114,000 units/180,000 two capsules 3 times a day, amlodipine 5 mg p.o. once a day.   REVIEW OF SYSTEMS:    CONSTITUTIONAL: The patient denies any fever or chills. No headache or dizziness. No weakness.  EYES: No double vision, blurred vision.  ENT: No postnasal drip, slurred speech or dysphagia.  CARDIOVASCULAR: No chest pain, palpitation, orthopnea,  no nocturnal dyspnea. No leg edema.  PULMONARY: No cough, sputum, shortness of breath or hemoptysis.  GASTROINTESTINAL: Positive for abdominal pain, nausea, vomiting but no diarrhea, no melena or bloody stool.  GENITOURINARY: No dysuria, hematuria, or incontinence.  SKIN: No rash or jaundice.  NEUROLOGICAL: No syncope, loss of consciousness, or seizure.  ENDOCRINE: No polyuria, polydipsia, heat or cold intolerance.  HEMATOLOGY: No easy bruising or bleeding.   PHYSICAL EXAMINATION:  VITAL SIGNS: Temperature 98, blood pressure 160/33, pulse 75, oxygen saturation 100% on room air.  GENERAL: The patient is alert, awake, oriented, in no acute distress.  HEENT: Pupils round, equal and reactive to light and accommodation. Moist oral mucosa. Clear oropharynx.  NECK: Supple. No JVD or carotid bruits are noted. No thyromegaly.  CARDIOVASCULAR: S1 and S2. Regular rate and rhythm. No murmurs, gallops.  PULMONARY: Bilateral air entry. No wheezing or rales. No use of accessory muscle to breathe.  ABDOMEN: Bowel sounds present, tenderness in middle part of the abdomen. No rigidity. No rebound. Bowel sounds present. No obvious organomegaly.  EXTREMITIES: No edema, clubbing, no  cyanosis. No calf tenderness. Bilateral pedal pulses present.  SKIN: No rash or  jaundice.  NEUROLOGIC: Alert and oriented x 3. No focal deficit. Power 5/5. Sensory intact.   LABORATORY DATA: CAT scan of the abdomen and pelvis showed no acute abdominal or pelvic findings.   Chest x-ray: No acute cardiopulmonary findings.   Troponin less than 0.02. CBC in normal range, glucose 296, BUN 11, creatinine 0.85, electrolytes normal.  Bilirubin 5.0, SGPT 43, SGOT 31, alkaline phosphatase 117, lipase 25.   Urinalysis is negative.   EKG showed normal sinus rhythm at 76 BPM.   IMPRESSIONS:  1.  Abdominal pain, possibly due to chronic pancreatitis.  2.  Hypertension.  3.  Diabetes.  4.  Hyperbilirubinemia.   PLAN OF TREATMENT: The patient will be placed for observation.  1.  We will keep n.p.o. with IV fluid support, give Dilaudid and Percocet p.r.n.   2.  In addition we will get a GI consult from Dr. Gustavo Lah and continue the patient's hypertension medication.  3.  For diabetes, we will start a sliding scale, but hold Lantus.   I discussed the patient's condition and plan of treatment with the patient.   The patient is a full code.   TIME SPENT: About 48 minutes.   ____________________________ Demetrios Loll, MD qc:lt D: 12/03/2013 14:21:04 ET T: 12/03/2013 14:51:56 ET JOB#: [id number]  cc: Demetrios Loll, MD, <Dictator> Demetrios Loll MD ELECTRONICALLY SIGNED 12/03/2013 21:20

## 2014-06-07 NOTE — Consult Note (Signed)
Chief Complaint:  Subjective/Chief Complaint Patietn seen and examined, chart reviewed, please see full GI consult and brief consult note.  [place] admitted with recurrence of episodic nausea vomiting and abdominal pain.  Patient with complex history of liver disorder/cirrhosis diagnosed around age 49 initially.  Also with history of ERCP induced pancreatitis about late March 2014, with pancreatic necrosis, necessitating surgical debridement, with resultant pancreatic malabsorbtion and DM, IR.  Patietn states she has had "flares" of abdominal pain, flet to be related to pancreatitis for over 10 years.  Of note, patient also takes Aleve on a regular basis, and increases use with episodes of abdominal pain.  She does not take a ppi.  She does have a h/o PUD as well in her early twenties.  Last EGD about 2013 per patient.  She has recently moved to this area, previously lived in [place], and has not established with local medical care.  Multiple labs have been ordered, as the initial diagnosis of Primary Biliary Cirrhosis was left in doubt after her ERCP with complications, though she shows imaging c/w cirrhosis and vascular complications (varices) thereof and she has also been told she had Hepatitis C but much of this history is uncertain.  I am obtaining a number of labs for further information.  Patietn was previously in [place] under the care of an endocrinologist for he brittle diabetes post partial pancreatectomy, as well as pain management. She has problems dealing with depression related to her chronic medical issues, and should also have consultation in that regard.  I will need to do EGD early in the week prior to d/c.  Depending on results of evaluation, may need furhter evaluation/consultation with hepatology at tertiary institution, though that can be arranged later if needed.   VITAL SIGNS/ANCILLARY NOTES: **Vital Signs.:   07-Aug-15 13:16  Vital Signs Type Routine  Temperature Temperature (F) 98  Celsius  36.6  Pulse Pulse 73  Respirations Respirations 20  Systolic BP Systolic BP 110  Diastolic BP (mmHg) Diastolic BP (mmHg) 87  Mean BP 109  Pulse Ox % Pulse Ox % 98  Pulse Ox Activity Level  At rest  Oxygen Delivery Room Air/ 21 %   Brief Assessment:  Cardiac Regular   Respiratory clear BS   Gastrointestinal details normal Soft  Bowel sounds normal  No rebound tenderness  tender to palpation in the luq toward umbiliculs  region on the left.   Lab Results: Hepatic:  06-Aug-15 17:46   Bilirubin, Total  4.5  Alkaline Phosphatase  150 (46-116 NOTE: New Reference Range 09/03/13)  SGPT (ALT)  133 (14-63 NOTE: New Reference Range 09/03/13)  SGOT (AST)  63  Total Protein, Serum  6.3  Albumin, Serum 3.5  Routine Chem:  06-Aug-15 17:46   Lipase  58 (Result(s) reported on 19 Sep 2013 at 06:05PM.)  07-Aug-15 04:21   Magnesium, Serum  1.0 (1.8-2.4 THERAPEUTIC RANGE: 4-7 mg/dL TOXIC: > 10 mg/dL  -----------------------)  BUN 7  Creatinine (comp) 0.79  Sodium, Serum 138  Potassium, Serum  2.9  Chloride, Serum 104  CO2, Serum 27  Calcium (Total), Serum  6.6  Anion Gap 7  Osmolality (calc) 280  eGFR (African American) >60  eGFR (Non-African American) >60 (eGFR values <4m/min/1.73 m2 may be an indication of chronic kidney disease (CKD). Calculated eGFR is useful in patients with stable renal function. The eGFR calculation will not be reliable in acutely ill patients when serum creatinine is changing rapidly. It is not useful in  patients on dialysis.  The eGFR calculation may not be applicable to patients at the low and high extremes of body sizes, pregnant women, and vegetarians.)  Result Comment CALCIUM - RESULTS VERIFIED BY REPEAT TESTING.  - NOTIFIED OF CRITICAL VALUE  - C/ MEGAN WERNER _0  09-20-13.Marland KitchenAJO  - READ-BACK PROCESS PERFORMED.  Result(s) reported on 20 Sep 2013 at 04:57AM.  Hemoglobin A1c [facility])  7.4 (The American Diabetes Association recommends that a primary  goal of therapy should be <7% and that physicians should reevaluate the treatment regimen in patients with HbA1c values consistently >8%.)  Routine Hem:  07-Aug-15 04:21   WBC (CBC) 5.7  RBC (CBC)  3.78  Hemoglobin (CBC) 12.1  Hematocrit (CBC)  33.9  Platelet Count (CBC)  108  MCV 90  MCH 32.0  MCHC 35.7  RDW 12.9  Neutrophil % 61.0  Lymphocyte % 32.7  Monocyte % 5.7  Eosinophil % 0.2  Basophil % 0.4  Neutrophil # 3.4  Lymphocyte # 1.8  Monocyte # 0.3  Eosinophil # 0.0  Basophil # 0.0 (Result(s) reported on 20 Sep 2013 at 04:57AM.)   Radiology Results: CT:    06-Aug-15 20:20, CT Abdomen and Pelvis With Contrast  CT Abdomen and Pelvis With Contrast   REASON FOR EXAM:    (1) No PO contrast, abdominal pain, vomiting; (2)   abdominal pain, vomiting  COMMENTS:       PROCEDURE: CT  - CT ABDOMEN / PELVIS  W  - Sep 19 2013  8:20PM     CLINICAL DATA:  Sudden onset nausea, vomiting, headache, epigastric  pain    EXAM:  CT ABDOMEN AND PELVIS WITH CONTRAST    TECHNIQUE:  Multidetector CT imaging of the abdomen and pelvis was performed  using the standard protocol following bolus administration of  intravenous contrast.    CONTRAST:  85 cc Isovue-300    COMPARISON:  Acute abdominal series 8 07/2013    FINDINGS:  Clear lung bases. Normal heart size. No pericardial or pleural  effusion.    Abdomen: Diffuse hypoattenuation of the liver compatible with  hepatic steatosis or fatty infiltration. There is some fatty sparing  in the liver hilum centrally. Prior cholecystectomy. No biliary  dilatation. Hepatic and portal veins are patent.  Small tortuous enhancing vessels about the stomach compatible with  early gastric varices suggesting portal hypertension.    The pancreas body and tail appear fatty replaced. Pancreatic head  appears unremarkable. The spleen, adrenal glands, and kidneys are  within normal limits for age and demonstrate no acute process.  Incidental left  kidney midpole 11 mm hypodense cyst noted.    No abdominal free fluid, fluid collection, hemorrhage, abscess, or  adenopathy.    Intact aorta. Negative for aneurysm or significant atherosclerosis.  No retroperitoneal hemorrhage.    No bowel obstruction, bowel wall thickening, or free air. Postop  changes of the bowel right lower quadrant.    Pelvis: Prior hysterectomy. No pelvic free fluid, fluid collection,  hemorrhage, abscess, adenopathy, inguinal abnormality, or hernia.  Urinary bladder is collapsed. No acute distal bowelprocess.    No acute osseous finding.     IMPRESSION:  No acute intra-abdominal or pelvic finding.    Extensive fatty infiltration of the liver.    Left upper quadrant small gastric varices noted, related to portal  hypertension.  Prior cholecystectomy    Incidental left renal cyst    Postop changes of the bowel in the right lower quadrant    Prior hysterectomy  Electronically Signed    By: Daryll Brod M.D.    On: 09/19/2013 20:41         Verified By: Earl Gala, M.D.,   Electronic Signatures: Loistine Simas (MD)  (Signed 07-Aug-15 19:42)  Authored: Chief Complaint, VITAL SIGNS/ANCILLARY NOTES, Brief Assessment, Lab Results, Radiology Results   Last Updated: 07-Aug-15 19:42 by Loistine Simas (MD)

## 2014-06-07 NOTE — Consult Note (Signed)
Chief Complaint:  Subjective/Chief Complaint seen for recurrent pancreatitis.  feeling a little better, emesis last night after clears, doing better today.  abdominal pain 8/10.  Appears comfortable.no bm, no flatus.   VITAL SIGNS/ANCILLARY NOTES: **Vital Signs.:   08-Aug-15 04:57  Vital Signs Type Routine  Temperature Temperature (F) 97.7  Temperature Source oral  Pulse Pulse 58  Respirations Respirations 20  Systolic BP Systolic BP 010  Diastolic BP (mmHg) Diastolic BP (mmHg) 75  Mean BP 86  Pulse Ox % Pulse Ox % 97  Pulse Ox Activity Level  At rest  Oxygen Delivery Room Air/ 21 %  *Intake and Output.:   Shift 08-Aug-15 15:00  Length of Stay Totals Intake:  4258 Output:  3725    Net:  533   Brief Assessment:  Cardiac Regular   Respiratory clear BS   Gastrointestinal details normal Soft  Nondistended  Bowel sounds normal  No rebound tenderness  mild tenderness to palpation generalized, positive pain to palpation in the luq.  some firmness across the lower abdomen.   Lab Results: Hepatic:  07-Aug-15 20:12   Bilirubin, Total  5.8  Alkaline Phosphatase  149 (46-116 NOTE: New Reference Range 09/03/13)  08-Aug-15 04:51   Bilirubin, Total  5.6  Alkaline Phosphatase  141 (46-116 NOTE: New Reference Range 09/03/13)  SGPT (ALT)  101 (14-63 NOTE: New Reference Range 09/03/13)  SGOT (AST)  55  Total Protein, Serum  5.7  Albumin, Serum  3.2  Routine Chem:  06-Aug-15 17:46   Lipase  58 (Result(s) reported on 19 Sep 2013 at 06:05PM.)  07-Aug-15 20:12   Ammonia, Plasma  50 (Result(s) reported on 20 Sep 2013 at 08:52PM.)  08-Aug-15 04:51   Glucose, Serum  148  BUN  4  Creatinine (comp) 0.74  Sodium, Serum 138  Potassium, Serum  3.4  Chloride, Serum 103  CO2, Serum 30  Calcium (Total), Serum  7.2  Osmolality (calc) 275  eGFR (African American) >60  eGFR (Non-African American) >60 (eGFR values <17m/min/1.73 m2 may be an indication of chronic kidney disease  (CKD). Calculated eGFR is useful in patients with stable renal function. The eGFR calculation will not be reliable in acutely ill patients when serum creatinine is changing rapidly. It is not useful in  patients on dialysis. The eGFR calculation may not be applicable to patients at the low and high extremes of body sizes, pregnant women, and vegetarians.)  Anion Gap  5  Magnesium, Serum 2.0 (1.8-2.4 THERAPEUTIC RANGE: 4-7 mg/dL TOXIC: > 10 mg/dL  -----------------------)  Lipase  34 (Result(s) reported on 21 Sep 2013 at 05:42AM.)  Routine Coag:  07-Aug-15 20:12   INR 1.2 (INR reference interval applies to patients on anticoagulant therapy. A single INR therapeutic range for coumarins is not optimal for all indications; however, the suggested range for most indications is 2.0 - 3.0. Exceptions to the INR Reference Range may include: Prosthetic heart valves, acute myocardial infarction, prevention of myocardial infarction, and combinations of aspirin and anticoagulant. The need for a higher or lower target INR must be assessed individually. Reference: The Pharmacology and Management of the Vitamin K  antagonists: the seventh ACCP Conference on Antithrombotic and Thrombolytic Therapy. CXNATF.5732Sept:126 (3suppl): [id number] A HCT value >55% may artifactually increase the PT.  In one study,  the increase was an average of 25%. Reference:  "Effect on Routine and Special Coagulation Testing Values of Citrate Anticoagulant Adjustment in Patients with High HCT Values." American Journal of Clinical Pathology 2006;126:400-405.)  Routine Hem:  08-Aug-15 04:51   WBC (CBC) 5.4  RBC (CBC)  3.67  Hemoglobin (CBC)  11.6  Hematocrit (CBC)  32.7  Platelet Count (CBC)  104  MCV 89  MCH 31.6  MCHC 35.5  RDW 13.2  Neutrophil % 56.0  Lymphocyte % 37.3  Monocyte % 5.2  Eosinophil % 1.2  Basophil % 0.3  Neutrophil # 3.0  Lymphocyte # 2.0  Monocyte # 0.3  Eosinophil # 0.1  Basophil # 0.0  (Result(s) reported on 21 Sep 2013 at 05:20AM.)   Radiology Results: [place]:    08-Aug-15 09:25, US Abdomen General Survey  US Abdomen General Survey   REASON FOR EXAM:    elevated lfts, ?cirrhosis. please dopple portal,   splenic, and hepatic veins  COMMENTS:       PROCEDURE: [place]  - US ABDOMEN GENERAL SURVEY  - Sep 21 2013  9:25AM     CLINICAL DATA:  Elevated liver function tests and hepatic steatosis  by CT. Prior cholecystectomy and partial pancreatectomy. Epigastric  abdominal pain.    EXAM:  ULTRASOUND ABDOMEN COMPLETE    COMPARISON:  CT of the abdomen on 09/19/2013  FINDINGS:  Gallbladder:    Surgically absent.    Common bile duct:    Diameter: Maximum measured diameter of 7 mm.    Liver:    The liver demonstrates coarse echotexture and markedly increased  echogenicity, reflecting severe steatosis. No overt cirrhotic  contour abnormalities or focal lesions are identified. There is no  evidence of intrahepatic biliary ductal dilatation. The portal vein  is open.    IVC:    No abnormality visualized.    Pancreas:    Very poorly visualized.    Spleen:    The spleen is nonenlarged.  Estimated splenic volume is 299 mL.    Right Kidney:  Length: 12.1 cm. Echogenicity within normal limits. No mass or  hydronephrosis visualized.    Left Kidney:    Length: 12.2 cm. 1.4 cm cyst demonstrates mildly complex features  with peripheral echogenicity and calcification present. Echogenicity  of the kidney is within normal limits. No hydronephrosis visualized.    Abdominal aorta:    No aneurysm visualized.    Other findings:  No ascites.     IMPRESSION:  Marked hepatic steatosis. No evidence of hepatic mass or biliary  ductal dilatation by ultrasound. No ascites.      Electronically Signed    By: Aletta Edouard M.D.    On: 09/21/2013 10:38         Verified By: Azzie Roup, M.D.,  CT:    06-Aug-15 20:20, CT Abdomen and Pelvis With Contrast  CT  Abdomen and Pelvis With Contrast   REASON FOR EXAM:    (1) No PO contrast, abdominal pain, vomiting; (2)   abdominal pain, vomiting  COMMENTS:       PROCEDURE: CT  - CT ABDOMEN / PELVIS  W  - Sep 19 2013  8:20PM     CLINICAL DATA:  Sudden onset nausea, vomiting, headache, epigastric  pain    EXAM:  CT ABDOMEN AND PELVIS WITH CONTRAST    TECHNIQUE:  Multidetector CT imaging of the abdomen and pelvis was performed  using the standard protocol following bolus administration of  intravenous contrast.    CONTRAST:  85 cc Isovue-300    COMPARISON:  Acute abdominal series 8 07/2013    FINDINGS:  Clear lung bases. Normal heart size. No pericardial or pleural  effusion.    Abdomen:  Diffuse hypoattenuation of the liver compatible with  hepatic steatosis or fatty infiltration. There is some fatty sparing  in the liver hilum centrally. Prior cholecystectomy. No biliary  dilatation. Hepatic and portal veins are patent.  Small tortuous enhancing vessels about the stomach compatible with  early gastric varices suggesting portal hypertension.    The pancreas body and tail appear fatty replaced. Pancreatic head  appears unremarkable. The spleen, adrenal glands, and kidneys are  within normal limits for age and demonstrate no acute process.  Incidental left kidney midpole 11 mm hypodense cyst noted.    No abdominal free fluid, fluid collection, hemorrhage, abscess, or  adenopathy.    Intact aorta. Negative for aneurysm or significant atherosclerosis.  No retroperitoneal hemorrhage.    No bowel obstruction, bowel wall thickening, or free air. Postop  changes of the bowel right lower quadrant.    Pelvis: Prior hysterectomy. No pelvic free fluid, fluid collection,  hemorrhage, abscess, adenopathy, inguinal abnormality, or hernia.  Urinary bladder is collapsed. No acute distal bowelprocess.    No acute osseous finding.     IMPRESSION:  No acute intra-abdominal or pelvic  finding.    Extensive fatty infiltration of the liver.    Left upper quadrant small gastric varices noted, related to portal  hypertension.  Prior cholecystectomy    Incidental left renal cyst    Postop changes of the bowel in the right lower quadrant    Prior hysterectomy      Electronically Signed    By: Daryll Brod M.D.    On: 09/19/2013 20:41         Verified By: Earl Gala, M.D.,   Assessment/Plan:  Assessment/Plan:  Assessment 1) hepatosteatosis-severe, likely NAFLD.  history of cirrhosis, liver disorder, possible Primary biliary cirrhosis per history.  multiple labs pending, please see full GI consult.  abd US shows no evidence of portal vein thrombosis or significant portal hypertension by velocity measurements and waveform analysis.  2) Chronic pancreatitis, pancreatic insufficiency.   Plan 1) continue current. will obtain 2 way abd film today.   Electronic Signatures: Loistine Simas (MD)  (Signed 08-Aug-15 12:30)  Authored: Chief Complaint, VITAL SIGNS/ANCILLARY NOTES, Brief Assessment, Lab Results, Radiology Results, Assessment/Plan   Last Updated: 08-Aug-15 12:30 by Loistine Simas (MD)

## 2014-06-07 NOTE — Consult Note (Signed)
Chief Complaint:  Subjective/Chief Complaint seen for acute on chronic pancreatitis and fatty liver. was feeling some better in anticipation of DC until she ate larger meal at supper.  Increased pain and nausea. no emesis   VITAL SIGNS/ANCILLARY NOTES: **Vital Signs.:   13-Aug-15 16:26  Vital Signs Type Recheck  Temperature Temperature (F) 97.8  Celsius 36.5  Temperature Source oral  Pulse Pulse 99  Respirations Respirations 19  Systolic BP Systolic BP 008  Diastolic BP (mmHg) Diastolic BP (mmHg) 78  Mean BP 89  Pulse Ox % Pulse Ox % 96  Pulse Ox Activity Level  At rest  Oxygen Delivery Room Air/ 21 %   Brief Assessment:  Cardiac Regular   Respiratory clear BS   Gastrointestinal details normal Soft  mild distension, mild diffuse tenderness especially ruq, bowel sounds are  somewhat higher pitch today that yesterday.   Lab Results: Hepatic:  12-Aug-15 04:01   Bilirubin, Total  5.7  Bilirubin, Direct  0.5 (Result(s) reported on 25 Sep 2013 at 04:35AM.)  Alkaline Phosphatase  145 (46-116 NOTE: New Reference Range 09/03/13)  SGPT (ALT)  105 (14-63 NOTE: New Reference Range 09/03/13)  SGOT (AST)  56  Total Protein, Serum  6.0  Albumin, Serum  3.0  Routine Chem:  12-Aug-15 04:01   Glucose, Serum  162  BUN  2  Creatinine (comp) 0.82  Sodium, Serum 139  Potassium, Serum  3.4  Chloride, Serum 99  CO2, Serum 31  Calcium (Total), Serum  7.8  Anion Gap 9  Osmolality (calc) 277  eGFR (African American) >60  eGFR (Non-African American) >60 (eGFR values <63m/min/1.73 m2 may be an indication of chronic kidney disease (CKD). Calculated eGFR is useful in patients with stable renal function. The eGFR calculation will not be reliable in acutely ill patients when serum creatinine is changing rapidly. It is not useful in  patients on dialysis. The eGFR calculation may not be applicable to patients at the low and high extremes of body sizes, pregnant women, and vegetarians.)   Routine Hem:  12-Aug-15 04:01   WBC (CBC) 4.8  RBC (CBC) 4.10  Hemoglobin (CBC) 12.7  Hematocrit (CBC) 37.1  Platelet Count (CBC)  110  MCV 91  MCH 31.0  MCHC 34.2  RDW 12.9  Neutrophil % 48.9  Lymphocyte % 41.8  Monocyte % 7.8  Eosinophil % 1.2  Basophil % 0.3  Neutrophil # 2.3  Lymphocyte # 2.0  Monocyte # 0.4  Eosinophil # 0.1  Basophil # 0.0 (Result(s) reported on 25 Sep 2013 at 04:35AM.)   Assessment/Plan:  Assessment/Plan:  Assessment 1) h/o severe hepatic  steatosis, abnormal lfts.  2) h/o subtotal pancreatic resection/debridement following pancreatitis/pancreatic nectosis 4/14.   3) n/v/epigastric abd pain- chronic/variable. 4) narcotic induced colonic pseudoobstruction 5) mild bile gastritis with gastric vascular hypertrophy/dilation/varices (likely secondary to previous pancreatic surgery/pancreatic necrosis   Plan 1) recommend soft, low residue diet, small meals to full liquids for now advance slowly over the next several days (can be done as o/p) , may be able to go home if tolerating this 2) will add miralax.  would NOT use lactulose. check kub in am.  3) LOWFAT diet.  4) GI fu in 10-14 days.  I have discussed with pateitn and her husband the possibliity of sending her to tertiary institution from the outpatient setting in regard for consideration of evaluation re liver transplant.  5) continue carafate and ppi as outpatient  I will not be here tomorrow through [facility] I will ask  Dr Vira Agar to see.   Electronic Signatures: Loistine Simas (MD)  (Signed 13-Aug-15 17:59)  Authored: Chief Complaint, VITAL SIGNS/ANCILLARY NOTES, Brief Assessment, Lab Results, Assessment/Plan   Last Updated: 13-Aug-15 17:59 by Loistine Simas (MD)

## 2014-06-07 NOTE — Consult Note (Signed)
Pt without vomiting today, no bleeding, she is bothered by isolation status and does not thing she had diarrhea ????.  Abd film showed no evidence of obvious mechanical obst.  She certainly is a  set-up for one given all her previous surgery.  Exam shows some general mild distention and discomfort as yesterday in L mid lower area.  Bowel sounds present.  No new recommendations, therapeutic options limited, watchful waiting is pretty much it.  Electronic Signatures: Manya Silvas (MD)  (Signed on 21-Oct-15 18:46)  Authored  Last Updated: 21-Oct-15 18:46 by Manya Silvas (MD)

## 2014-06-07 NOTE — Consult Note (Signed)
Pt with some swelling but no worsening in pain.  She took clear liquids for 2ed day without vomiting and will try to advance diet.  PE not changed, palpable irregular distention.  Will give Fleets enema and see if can help with the mild bloating.  Electronic Signatures: Manya Silvas (MD)  (Signed on 22-Oct-15 16:10)  Authored  Last Updated: 22-Oct-15 16:10 by Manya Silvas (MD)

## 2014-06-07 NOTE — Consult Note (Signed)
Brief Consult Note: Diagnosis: abdominal pain.   Patient was seen by consultant.   Consult note dictated.   Comments: Appreciate consult for pleasant 49 y/o caucasian woman with complicated history of GI issues for evaluation of chronic pancreatitis and elevated LFTs. Hx significant for PBC, HCV (?spontaneously cleared), HAV, biliary pancreatitis- worse after ERCP last year- became necrotizing and subsequently underwent partial pancreatectomy at [facility] in [place]. GI providers in [place] were Dr Tacey Ruiz and Dr Lyndel Safe. Had complicated course- went to a pain clinc for a while.Was on ursodiol for a while but by pt report had a reaction to this, so no longer on this. Does take Creon 36,0000 caps: 2 with meals & 1 with snack.   For this episode- over the week developed increasing luq pain radiating to her back associated with NV. Feeling better now after painmed/hydration/antiemetic. Some of the luq pain persists and has hot flashes but overall improved. Several labs pending. On exam, patient is jaundiced- this is not new and has luq tenderness. Does also report a history of chronic diarrhea, about 6 stools/d & attributes this to having colon resection in past due to surgical complications of bladder surgeries. Reports a 70lb weight loss over the last year. No other GI complaints in past. Noted electrolyte abnormalities today that for which she recieved replacements today. Impression and Plan; acute on chronic abdominal pain. Elevated lfts. History of chronic pancreatitis s/p pancreatic resection 2014, ?PBC, ?HCV, cirrhosis. Diarrhea and weight loss- this can go with chronic pancreatitis as well. Feeling better today & would continue current therapy.  Will attempt to clarify her diagnoses: AMA, ASMA, ANA, HCV, HBV, HAV, ammonia, liver panel, pt/inr tests for now.  Abdominal US with portal, splenic, and hepatic dopplers. Patient did have some of her medical records for me to review, which was appreciated: these were  from [facility] in [place], [place], and were from [place] of last year..  Electronic Signatures: Stephens November H (NP)  (Signed 07-Aug-15 18:26)  Authored: Brief Consult Note   Last Updated: 07-Aug-15 18:26 by Theodore Demark (NP)

## 2014-07-17 ENCOUNTER — Other Ambulatory Visit: Payer: Self-pay

## 2014-07-17 ENCOUNTER — Encounter: Payer: Self-pay | Admitting: Medical Oncology

## 2014-07-17 ENCOUNTER — Emergency Department
Admission: EM | Admit: 2014-07-17 | Discharge: 2014-07-17 | Disposition: A | Discharge status: Home / Self Care | Payer: Medicare PPO | Attending: Emergency Medicine | Admitting: Emergency Medicine

## 2014-07-17 DIAGNOSIS — R300 Dysuria: Secondary | ICD-10-CM | POA: Insufficient documentation

## 2014-07-17 DIAGNOSIS — M546 Pain in thoracic spine: Secondary | ICD-10-CM | POA: Insufficient documentation

## 2014-07-17 DIAGNOSIS — E1165 Type 2 diabetes mellitus with hyperglycemia: Secondary | ICD-10-CM | POA: Diagnosis not present

## 2014-07-17 DIAGNOSIS — M25512 Pain in left shoulder: Secondary | ICD-10-CM | POA: Diagnosis not present

## 2014-07-17 DIAGNOSIS — R739 Hyperglycemia, unspecified: Secondary | ICD-10-CM

## 2014-07-17 DIAGNOSIS — R1012 Left upper quadrant pain: Secondary | ICD-10-CM

## 2014-07-17 DIAGNOSIS — E1143 Type 2 diabetes mellitus with diabetic autonomic (poly)neuropathy: Secondary | ICD-10-CM | POA: Diagnosis not present

## 2014-07-17 DIAGNOSIS — K3184 Gastroparesis: Secondary | ICD-10-CM

## 2014-07-17 DIAGNOSIS — M25511 Pain in right shoulder: Secondary | ICD-10-CM | POA: Insufficient documentation

## 2014-07-17 HISTORY — DX: Liver disease, unspecified: K76.9

## 2014-07-17 HISTORY — DX: Essential (primary) hypertension: I10

## 2014-07-17 HISTORY — DX: Type 2 diabetes mellitus without complications: E11.9

## 2014-07-17 HISTORY — DX: Gastro-esophageal reflux disease without esophagitis: K21.9

## 2014-07-17 LAB — COMPREHENSIVE METABOLIC PANEL
ALT: 49 U/L (ref 14–54)
AST: 46 U/L — ABNORMAL HIGH (ref 15–41)
Albumin: 4 g/dL (ref 3.5–5.0)
Alkaline Phosphatase: 123 U/L (ref 38–126)
Anion gap: 9 (ref 5–15)
BUN: 15 mg/dL (ref 6–20)
CO2: 23 mmol/L (ref 22–32)
Calcium: 7.5 mg/dL — ABNORMAL LOW (ref 8.9–10.3)
Chloride: 101 mmol/L (ref 101–111)
Creatinine, Ser: 0.76 mg/dL (ref 0.44–1.00)
GFR calc Af Amer: >60 mL/min (ref >60)
GFR calc non Af Amer: >60 mL/min (ref >60)
Glucose, Bld: 455 mg/dL — ABNORMAL HIGH (ref 65–99)
Potassium: 3.3 mmol/L — ABNORMAL LOW (ref 3.5–5.1)
Sodium: 133 mmol/L — ABNORMAL LOW (ref 135–145)
Total Bilirubin: 5.2 mg/dL — ABNORMAL HIGH (ref 0.3–1.2)
Total Protein: 6.5 g/dL (ref 6.5–8.1)

## 2014-07-17 LAB — CBC WITH DIFFERENTIAL/PLATELET
Basophils Absolute: 0 10*3/uL (ref 0–0.1)
Basophils Relative: 1 %
Eosinophils Absolute: 0 10*3/uL (ref 0–0.7)
Eosinophils Relative: 1 %
HCT: 37 % (ref 35.0–47.0)
Hemoglobin: 12.9 g/dL (ref 12.0–16.0)
Lymphocytes Relative: 32 %
Lymphs Abs: 1.7 10*3/uL (ref 1.0–3.6)
MCH: 30.7 pg (ref 26.0–34.0)
MCHC: 35 g/dL (ref 32.0–36.0)
MCV: 87.7 fL (ref 80.0–100.0)
Monocytes Absolute: 0.3 10*3/uL (ref 0.2–0.9)
Monocytes Relative: 6 %
Neutro Abs: 3.2 10*3/uL (ref 1.4–6.5)
Neutrophils Relative %: 60 %
Platelets: 128 10*3/uL — ABNORMAL LOW (ref 150–440)
RBC: 4.21 MIL/uL (ref 3.80–5.20)
RDW: 13.1 % (ref 11.5–14.5)
WBC: 5.3 10*3/uL (ref 3.6–11.0)

## 2014-07-17 LAB — URINALYSIS COMPLETE WITH MICROSCOPIC (ARMC ONLY)
Bilirubin Urine: NEGATIVE
Glucose, UA: >500 mg/dL — AB
Hgb urine dipstick: NEGATIVE
Ketones, ur: NEGATIVE mg/dL
Leukocytes, UA: NEGATIVE
Nitrite: NEGATIVE
Protein, ur: NEGATIVE mg/dL
Specific Gravity, Urine: 1.022 (ref 1.005–1.030)
pH: 5 (ref 5.0–8.0)

## 2014-07-17 LAB — LIPASE, BLOOD: Lipase: 27 U/L (ref 22–51)

## 2014-07-17 LAB — GLUCOSE, CAPILLARY: Glucose-Capillary: 301 mg/dL — ABNORMAL HIGH (ref 65–99)

## 2014-07-17 MED ORDER — METOCLOPRAMIDE HCL 10 MG PO TABS: 10.0000 mg | ORAL_TABLET | Freq: Three times a day (TID) | ORAL | Status: DC | PRN

## 2014-07-17 MED ORDER — INSULIN ASPART 100 UNIT/ML ~~LOC~~ SOLN
5.0000 [IU] | Freq: Once | SUBCUTANEOUS | Status: AC
  Administered 2014-07-17: 5 [IU] via SUBCUTANEOUS

## 2014-07-17 MED ORDER — INSULIN ASPART 100 UNIT/ML ~~LOC~~ SOLN
SUBCUTANEOUS | Status: AC
  Administered 2014-07-17: 5 [IU] via SUBCUTANEOUS
  Filled 2014-07-17: qty 5

## 2014-07-17 MED ORDER — SODIUM CHLORIDE 0.9 % IV SOLN
1000.0000 mL | Freq: Once | INTRAVENOUS | Status: AC
  Administered 2014-07-17: 1000 mL via INTRAVENOUS

## 2014-07-17 MED ORDER — HYDROMORPHONE HCL 1 MG/ML IJ SOLN
INTRAMUSCULAR | Status: AC
  Administered 2014-07-17: 1 mg via INTRAVENOUS
  Filled 2014-07-17: qty 1

## 2014-07-17 MED ORDER — HYDROMORPHONE HCL 1 MG/ML IJ SOLN
1.0000 mg | Freq: Once | INTRAMUSCULAR | Status: AC
  Administered 2014-07-17: 1 mg via INTRAVENOUS

## 2014-07-17 MED ORDER — ONDANSETRON HCL 4 MG/2ML IJ SOLN: 4.0000 mg | Freq: Once | INTRAMUSCULAR | Status: DC

## 2014-07-17 MED ORDER — PROMETHAZINE HCL 25 MG/ML IJ SOLN
25.0000 mg | Freq: Once | INTRAMUSCULAR | Status: AC
  Administered 2014-07-17: 25 mg via INTRAVENOUS

## 2014-07-17 MED ORDER — PROMETHAZINE HCL 25 MG/ML IJ SOLN
INTRAMUSCULAR | Status: AC
  Administered 2014-07-17: 25 mg via INTRAVENOUS
  Filled 2014-07-17: qty 1

## 2014-07-17 NOTE — ED Provider Notes (Signed)
[facility] Emergency Department Provider Note     Time seen: ----------------------------------------- 7:22 AM on 07/17/2014 -----------------------------------------    I have reviewed the triage vital signs and the nursing notes.   HISTORY  Chief Complaint No chief complaint on file.    HPI Amber Christensen is a 49 y.o. female who presents ER for severe sharp upper abdominal pain. States last night she started with some diarrhea, that gets nauseous since that period time, intense upper abdominal pain. Seems to be in the left upper quadrant mostly radiates into her back and shoulders. Her Left Flank. She Does Have a History of Pancreatitis, He'll Similar to Same. Nothing Seems to Make It Better or Worse.   No past medical history on file.  There are no active problems to display for this patient.   No past surgical history on file.  No current outpatient prescriptions on file.  Allergies Review of patient's allergies indicates not on file.  No family history on file.  Social History History  Substance Use Topics  . Smoking status: Not on file  . Smokeless tobacco: Not on file  . Alcohol Use: Not on file    Review of Systems Constitutional: Negative for fever. Eyes: Negative for visual changes. ENT: Negative for sore throat. Cardiovascular: Negative for chest pain. Respiratory: Negative for shortness of breath. Gastrointestinal: Positive for abdominal pain, nausea and diarrhea Genitourinary: Negative for dysuria. Musculoskeletal: Positive for upper back and shoulder pain Skin: Negative for rash. Neurological: Negative for headaches, focal weakness or numbness.  10-point ROS otherwise negative.  ____________________________________________   PHYSICAL EXAM:  VITAL SIGNS: ED Triage Vitals  Enc Vitals Group     BP --      Pulse --      Resp --      Temp --      Temp src --      SpO2 --      Weight --      Height --    Head Cir --      Peak Flow --      Pain Score --      Pain Loc --      Pain Edu? --      Excl. in [place]? --     Constitutional: Alert and oriented. Mild distress Eyes: Conjunctivae are normal. PERRL. Normal extraocular movements. ENT   Head: Normocephalic and atraumatic.   Nose: No congestion/rhinnorhea.   Mouth/Throat: Mucous membranes are moist.   Neck: No stridor. Hematological/Lymphatic/Immunilogical: No cervical lymphadenopathy. Cardiovascular: Normal rate, regular rhythm. Normal and symmetric distal pulses are present in all extremities. No murmurs, rubs, or gallops. Respiratory: Normal respiratory effort without tachypnea nor retractions. Breath sounds are clear and equal bilaterally. No wheezes/rales/rhonchi. Gastrointestinal: Epigastric and left upper quadrant tenderness, no rebound or guarding. Hypoactive bowel sounds. Musculoskeletal: Nontender with normal range of motion in all extremities. No joint effusions.  No lower extremity tenderness nor edema. Neurologic:  Normal speech and language. No gross focal neurologic deficits are appreciated. Speech is normal. No gait instability. Skin:  Skin is warm, dry and intact. No rash noted. Psychiatric: Mood and affect are normal. Speech and behavior are normal. Patient exhibits appropriate insight and judgment.  ____________________________________________    LABS (pertinent positives/negatives)  Labs Reviewed  CBC WITH DIFFERENTIAL/PLATELET - Abnormal; Notable for the following:    Platelets 128 (*)    All other components within normal limits  COMPREHENSIVE METABOLIC PANEL - Abnormal; Notable for the following:  Sodium 133 (*)    Potassium 3.3 (*)    Glucose, Bld 455 (*)    Calcium 7.5 (*)    AST 46 (*)    Total Bilirubin 5.2 (*)    All other components within normal limits  URINALYSIS COMPLETEWITH MICROSCOPIC ([facility] ONLY) - Abnormal; Notable for the following:    Color, Urine YELLOW (*)    APPearance CLEAR  (*)    Glucose, UA >500 (*)    Bacteria, UA RARE (*)    Squamous Epithelial / LPF 0-5 (*)    All other components within normal limits  GLUCOSE, CAPILLARY - Abnormal; Notable for the following:    Glucose-Capillary 301 (*)    All other components within normal limits  LIPASE, BLOOD    ____________________________________________  ED COURSE:  Pertinent labs & imaging results that were available during my care of the patient were reviewed by me and considered in my medical decision making (see chart for details).  Suspect pancreatitis, patient has a history of same. We'll check abdominal labs and reevaluate. ____________________________________________ EKG: Normal sinus rhythm, rate 86, normal axis normal intervals, no evidence of hypertrophy or acute infarction.  RADIOLOGY  None  ____________________________________________    FINAL ASSESSMENT AND PLAN  Abdominal pain, hyperglycemia  Plan: Suspect possible gastroparesis from unmanaged hyperglycemia. Patient's sugars have come down nicely here we gave her subcutaneous insulin as well. She is stable for outpatient follow-up with her GI doctors.    Earleen Newport, MD   Earleen Newport, MD 07/17/14 1001

## 2014-07-17 NOTE — Discharge Instructions (Signed)
Gastroparesis  Gastroparesis is also called slowed stomach emptying (delayed gastric emptying). It is a condition in which the stomach takes too long to empty its contents. It often happens in people with diabetes.  CAUSES  Gastroparesis happens when nerves to the stomach are damaged or stop working. When the nerves are damaged, the muscles of the stomach and intestines do not work normally. The movement of food is slowed or stopped. High blood glucose (sugar) causes changes in nerves and can damage the blood vessels that carry oxygen and nutrients to the nerves. RISK FACTORS  Diabetes.  Post-viral syndromes.  Eating disorders (anorexia, bulimia).  Surgery on the stomach or vagus nerve.  Gastroesophageal reflux disease (rarely).  Smooth muscle disorders (amyloidosis, scleroderma).  Metabolic disorders, including hypothyroidism.  Parkinson disease. SYMPTOMS   Heartburn.  Feeling sick to your stomach (nausea).  Vomiting of undigested food.  An early feeling of fullness when eating.  Weight loss.  Abdominal bloating.  Erratic blood glucose levels.  Lack of appetite.  Gastroesophageal reflux.  Spasms of the stomach wall. Complications can include:  Bacterial overgrowth in stomach. Food stays in the stomach and can ferment and cause bacteria to grow.  Weight loss due to difficulty digesting and absorbing nutrients.  Vomiting.  Obstruction in the stomach. Undigested food can harden and cause nausea and vomiting.  Blood glucose fluctuations caused by inconsistent food absorption. DIAGNOSIS  The diagnosis of gastroparesis is confirmed through one or more of the following tests:  Barium X-rays and scans. These tests look at how long it takes for food to move through the stomach.  Gastric manometry. This test measures electrical and muscular activity in the stomach. A thin tube is passed down the throat into the stomach. The tube contains a wire that takes measurements  of the stomach's electrical and muscular activity as it digests liquids and solid food.  Endoscopy. This procedure is done with a long, thin tube called an endoscope. It is passed through the mouth and gently down the esophagus into the stomach. This tube helps the caregiver look at the lining of the stomach to check for any abnormalities.  Ultrasonography. This can rule out gallbladder disease or pancreatitis. This test will outline and define the shape of the gallbladder and pancreas. TREATMENT   Treatments may include:  Exercise.  Medicines to control nausea and vomiting.  Medicines to stimulate stomach muscles.  Changes in what and when you eat.  Having smaller meals more often.  Eating low-fiber forms of high-fiber foods, such as eating cooked vegetables instead of raw vegetables.  Eating low-fat foods.  Consuming liquids, which are easier to digest.  In severe cases, feeding tubes and intravenous (IV) feeding may be needed. It is important to note that in most cases, treatment does not cure gastroparesis. It is usually a lasting (chronic) condition. Treatment helps you manage the underlying condition so that you can be as healthy and comfortable as possible. Other treatments  A gastric neurostimulator has been developed to assist people with gastroparesis. The battery-operated device is surgically implanted. It emits mild electrical pulses to help improve stomach emptying and to control nausea and vomiting.  The use of botulinum toxin has been shown to improve stomach emptying by decreasing the prolonged contractions of the muscle between the stomach and the small intestine (pyloric sphincter). The benefits are temporary. SEEK MEDICAL CARE IF:   You have diabetes and you are having problems keeping your blood glucose in goal range.  You are having nausea,  vomiting, bloating, or early feelings of fullness with eating.  Your symptoms do not change with a change in  diet. Document Released: 01/31/2005 Document Revised: 05/28/2012 Document Reviewed: 07/10/2008 [facility] Patient Information 2015 [place], [place]. This information is not intended to replace advice given to you by your health care provider. Make sure you discuss any questions you have with your health care provider.  Abdominal Pain Many things can cause abdominal pain. Usually, abdominal pain is not caused by a disease and will improve without treatment. It can often be observed and treated at home. Your health care provider will do a physical exam and possibly order blood tests and X-rays to help determine the seriousness of your pain. However, in many cases, more time must pass before a clear cause of the pain can be found. Before that point, your health care provider may not know if you need more testing or further treatment. HOME CARE INSTRUCTIONS  Monitor your abdominal pain for any changes. The following actions may help to alleviate any discomfort you are experiencing:  Only take over-the-counter or prescription medicines as directed by your health care provider.  Do not take laxatives unless directed to do so by your health care provider.  Try a clear liquid diet (broth, tea, or water) as directed by your health care provider. Slowly move to a bland diet as tolerated. SEEK MEDICAL CARE IF:  You have unexplained abdominal pain.  You have abdominal pain associated with nausea or diarrhea.  You have pain when you urinate or have a bowel movement.  You experience abdominal pain that wakes you in the night.  You have abdominal pain that is worsened or improved by eating food.  You have abdominal pain that is worsened with eating fatty foods.  You have a fever. SEEK IMMEDIATE MEDICAL CARE IF:   Your pain does not go away within 2 hours.  You keep throwing up (vomiting).  Your pain is felt only in portions of the abdomen, such as the right side or the left lower portion of the  abdomen.  You pass bloody or black tarry stools. MAKE SURE YOU:  Understand these instructions.   Will watch your condition.   Will get help right away if you are not doing well or get worse.  Document Released: 11/10/2004 Document Revised: 02/05/2013 Document Reviewed: 10/10/2012 [facility] Patient Information 2015 [place], [place]. This information is not intended to replace advice given to you by your health care provider. Make sure you discuss any questions you have with your health care provider.

## 2014-07-17 NOTE — ED Notes (Signed)
Pt from home via ems with reports that she began last night with left flank pain, pain subsided, began again this am. Also c/o epigastric pain, chest pain and shoulder pain. Reports nausea without vomiting.

## 2014-07-21 ENCOUNTER — Other Ambulatory Visit: Payer: Self-pay | Admitting: Gastroenterology

## 2014-07-21 DIAGNOSIS — K7581 Nonalcoholic steatohepatitis (NASH): Secondary | ICD-10-CM

## 2014-07-28 ENCOUNTER — Ambulatory Visit
Admission: RE | Admit: 2014-07-28 | Discharge: 2014-07-28 | Disposition: A | Discharge status: Home / Self Care | Payer: Medicare PPO | Source: Ambulatory Visit | Attending: Gastroenterology | Admitting: Gastroenterology

## 2014-07-28 DIAGNOSIS — N281 Cyst of kidney, acquired: Secondary | ICD-10-CM | POA: Diagnosis not present

## 2014-07-28 DIAGNOSIS — Z90411 Acquired partial absence of pancreas: Secondary | ICD-10-CM | POA: Diagnosis not present

## 2014-07-28 DIAGNOSIS — K7581 Nonalcoholic steatohepatitis (NASH): Secondary | ICD-10-CM | POA: Insufficient documentation

## 2014-07-28 DIAGNOSIS — K760 Fatty (change of) liver, not elsewhere classified: Secondary | ICD-10-CM | POA: Diagnosis not present

## 2014-07-28 DIAGNOSIS — Z9049 Acquired absence of other specified parts of digestive tract: Secondary | ICD-10-CM | POA: Diagnosis not present

## 2014-08-01 ENCOUNTER — Other Ambulatory Visit: Payer: Self-pay | Admitting: Gastroenterology

## 2014-08-01 DIAGNOSIS — R1012 Left upper quadrant pain: Secondary | ICD-10-CM

## 2014-08-12 ENCOUNTER — Other Ambulatory Visit: Payer: Self-pay | Admitting: Gastroenterology

## 2014-08-12 DIAGNOSIS — Z9049 Acquired absence of other specified parts of digestive tract: Secondary | ICD-10-CM

## 2014-08-12 DIAGNOSIS — K56609 Unspecified intestinal obstruction, unspecified as to partial versus complete obstruction: Secondary | ICD-10-CM

## 2014-08-12 DIAGNOSIS — R1012 Left upper quadrant pain: Secondary | ICD-10-CM

## 2014-08-19 ENCOUNTER — Other Ambulatory Visit: Payer: Medicare PPO

## 2014-08-20 ENCOUNTER — Inpatient Hospital Stay: Admission: RE | Admit: 2014-08-20 | Payer: Medicare PPO | Source: Ambulatory Visit

## 2014-08-21 ENCOUNTER — Ambulatory Visit
Admission: RE | Admit: 2014-08-21 | Discharge: 2014-08-21 | Disposition: A | Discharge status: Home / Self Care | Payer: Medicare PPO | Source: Ambulatory Visit | Attending: Gastroenterology | Admitting: Gastroenterology

## 2014-08-21 ENCOUNTER — Ambulatory Visit: Payer: Medicare PPO | Admitting: Dietician

## 2014-08-21 ENCOUNTER — Inpatient Hospital Stay: Admission: RE | Admit: 2014-08-21 | Payer: Medicare PPO | Source: Ambulatory Visit

## 2014-08-21 DIAGNOSIS — Z9049 Acquired absence of other specified parts of digestive tract: Secondary | ICD-10-CM

## 2014-08-21 DIAGNOSIS — K56609 Unspecified intestinal obstruction, unspecified as to partial versus complete obstruction: Secondary | ICD-10-CM

## 2014-08-21 DIAGNOSIS — R1012 Left upper quadrant pain: Secondary | ICD-10-CM

## 2014-08-22 ENCOUNTER — Other Ambulatory Visit: Payer: Medicare PPO

## 2015-02-24 ENCOUNTER — Emergency Department: Payer: Medicare PPO

## 2015-02-24 ENCOUNTER — Encounter: Payer: Self-pay | Admitting: Emergency Medicine

## 2015-02-24 ENCOUNTER — Emergency Department
Admission: EM | Admit: 2015-02-24 | Discharge: 2015-02-24 | Disposition: A | Discharge status: Home / Self Care | Payer: Medicare PPO | Attending: Emergency Medicine | Admitting: Emergency Medicine

## 2015-02-24 DIAGNOSIS — R0789 Other chest pain: Secondary | ICD-10-CM | POA: Diagnosis not present

## 2015-02-24 DIAGNOSIS — Z79899 Other long term (current) drug therapy: Secondary | ICD-10-CM | POA: Insufficient documentation

## 2015-02-24 DIAGNOSIS — Z794 Long term (current) use of insulin: Secondary | ICD-10-CM | POA: Insufficient documentation

## 2015-02-24 DIAGNOSIS — I10 Essential (primary) hypertension: Secondary | ICD-10-CM

## 2015-02-24 DIAGNOSIS — Z7982 Long term (current) use of aspirin: Secondary | ICD-10-CM | POA: Diagnosis not present

## 2015-02-24 DIAGNOSIS — G8929 Other chronic pain: Secondary | ICD-10-CM | POA: Diagnosis not present

## 2015-02-24 DIAGNOSIS — R0602 Shortness of breath: Secondary | ICD-10-CM | POA: Insufficient documentation

## 2015-02-24 DIAGNOSIS — E1165 Type 2 diabetes mellitus with hyperglycemia: Secondary | ICD-10-CM

## 2015-02-24 DIAGNOSIS — R079 Chest pain, unspecified: Secondary | ICD-10-CM | POA: Diagnosis present

## 2015-02-24 LAB — COMPREHENSIVE METABOLIC PANEL
ALT: 40 U/L (ref 14–54)
AST: 24 U/L (ref 15–41)
Albumin: 4.1 g/dL (ref 3.5–5.0)
Alkaline Phosphatase: 122 U/L (ref 38–126)
Anion gap: 9 (ref 5–15)
BUN: 9 mg/dL (ref 6–20)
CO2: 29 mmol/L (ref 22–32)
Calcium: 8.1 mg/dL — ABNORMAL LOW (ref 8.9–10.3)
Chloride: 100 mmol/L — ABNORMAL LOW (ref 101–111)
Creatinine, Ser: 0.63 mg/dL (ref 0.44–1.00)
GFR calc Af Amer: >60 mL/min (ref >60)
GFR calc non Af Amer: >60 mL/min (ref >60)
Glucose, Bld: 416 mg/dL — ABNORMAL HIGH (ref 65–99)
Potassium: 3.3 mmol/L — ABNORMAL LOW (ref 3.5–5.1)
Sodium: 138 mmol/L (ref 135–145)
Total Bilirubin: 4.9 mg/dL — ABNORMAL HIGH (ref 0.3–1.2)
Total Protein: 6.7 g/dL (ref 6.5–8.1)

## 2015-02-24 LAB — CBC WITH DIFFERENTIAL/PLATELET
Basophils Absolute: 0 10*3/uL (ref 0–0.1)
Basophils Relative: 1 %
Eosinophils Absolute: 0.1 10*3/uL (ref 0–0.7)
Eosinophils Relative: 1 %
HCT: 38.8 % (ref 35.0–47.0)
Hemoglobin: 13.4 g/dL (ref 12.0–16.0)
Lymphocytes Relative: 34 %
Lymphs Abs: 2.2 10*3/uL (ref 1.0–3.6)
MCH: 29.7 pg (ref 26.0–34.0)
MCHC: 34.6 g/dL (ref 32.0–36.0)
MCV: 85.9 fL (ref 80.0–100.0)
Monocytes Absolute: 0.3 10*3/uL (ref 0.2–0.9)
Monocytes Relative: 5 %
Neutro Abs: 3.8 10*3/uL (ref 1.4–6.5)
Neutrophils Relative %: 59 %
Platelets: 146 10*3/uL — ABNORMAL LOW (ref 150–440)
RBC: 4.52 MIL/uL (ref 3.80–5.20)
RDW: 12.8 % (ref 11.5–14.5)
WBC: 6.4 10*3/uL (ref 3.6–11.0)

## 2015-02-24 LAB — LIPASE, BLOOD: Lipase: 14 U/L (ref 11–51)

## 2015-02-24 LAB — PROTIME-INR
INR: 1.18
Prothrombin Time: 15.2 seconds — ABNORMAL HIGH (ref 11.4–15.0)

## 2015-02-24 LAB — TROPONIN I
Troponin I: <0.03 ng/mL (ref <0.031)
Troponin I: <0.03 ng/mL (ref <0.031)

## 2015-02-24 LAB — MAGNESIUM: Magnesium: 1.4 mg/dL — ABNORMAL LOW (ref 1.7–2.4)

## 2015-02-24 MED ORDER — SODIUM CHLORIDE 0.9 % IV BOLUS (SEPSIS)
1000.0000 mL | INTRAVENOUS | Status: AC
  Administered 2015-02-24: 1000 mL via INTRAVENOUS

## 2015-02-24 MED ORDER — POTASSIUM CHLORIDE CRYS ER 20 MEQ PO TBCR
40.0000 meq | EXTENDED_RELEASE_TABLET | Freq: Once | ORAL | Status: AC
  Administered 2015-02-24: 40 meq via ORAL
  Filled 2015-02-24: qty 2

## 2015-02-24 MED ORDER — LABETALOL HCL 5 MG/ML IV SOLN
10.0000 mg | Freq: Once | INTRAVENOUS | Status: AC
  Administered 2015-02-24: 10 mg via INTRAVENOUS
  Filled 2015-02-24: qty 4

## 2015-02-24 MED ORDER — MAGNESIUM SULFATE 2 GM/50ML IV SOLN
2.0000 g | Freq: Once | INTRAVENOUS | Status: AC
Start: 2015-02-24 — End: 2015-02-24
  Administered 2015-02-24: 2 g via INTRAVENOUS
  Filled 2015-02-24: qty 50

## 2015-02-24 NOTE — ED Notes (Addendum)
Pt presents to ED via EMS with c/o intermittent chest x2 weeks that worsen around 1300 today and associated with SOB and shaking. Pt traveled to [place] over the weekend in a long drive. Per EMS, [id number], pt admits to not taking her insulin after lunch and also missed her BP meds as well Pt alerts and orient x4 at this, airway intact.

## 2015-02-24 NOTE — Discharge Instructions (Signed)
As we discussed, your workup today was reassuring.  Though we do not know exactly what is causing your symptoms, it appears that you have no emergent medical condition at this time are safe to go home and follow up as recommended in this paperwork.  We recommend that he continue taking your regular medications as prescribed and follow up with her outpatient doctor as soon as possible.  Call in the morning for the next available appointment.  Please return immediately to the Emergency Department if you develop any new or worsening symptoms that concern you.   Hypertension Hypertension, commonly called high blood pressure, is when the force of blood pumping through your arteries is too strong. Your arteries are the blood vessels that carry blood from your heart throughout your body. A blood pressure reading consists of a higher number over a lower number, such as 110/72. The higher number (systolic) is the pressure inside your arteries when your heart pumps. The lower number (diastolic) is the pressure inside your arteries when your heart relaxes. Ideally you want your blood pressure below 120/80. Hypertension forces your heart to work harder to pump blood. Your arteries may become narrow or stiff. Having untreated or uncontrolled hypertension can cause heart attack, stroke, kidney disease, and other problems. RISK FACTORS Some risk factors for high blood pressure are controllable. Others are not.  Risk factors you cannot control include:   Race. You may be at higher risk if you are African American.  Age. Risk increases with age.  Gender. Men are at higher risk than women before age 37 years. After age 24, women are at higher risk than men. Risk factors you can control include:  Not getting enough exercise or physical activity.  Being overweight.  Getting too much fat, sugar, calories, or salt in your diet.  Drinking too much alcohol. SIGNS AND SYMPTOMS Hypertension does not usually cause  signs or symptoms. Extremely high blood pressure (hypertensive crisis) may cause headache, anxiety, shortness of breath, and nosebleed. DIAGNOSIS To check if you have hypertension, your health care provider will measure your blood pressure while you are seated, with your arm held at the level of your heart. It should be measured at least twice using the same arm. Certain conditions can cause a difference in blood pressure between your right and left arms. A blood pressure reading that is higher than normal on one occasion does not mean that you need treatment. If it is not clear whether you have high blood pressure, you may be asked to return on a different day to have your blood pressure checked again. Or, you may be asked to monitor your blood pressure at home for 1 or more weeks. TREATMENT Treating high blood pressure includes making lifestyle changes and possibly taking medicine. Living a healthy lifestyle can help lower high blood pressure. You may need to change some of your habits. Lifestyle changes may include:  Following the DASH diet. This diet is high in fruits, vegetables, and whole grains. It is low in salt, red meat, and added sugars.  Keep your sodium intake below 2,300 mg per day.  Getting at least 30-45 minutes of aerobic exercise at least 4 times per week.  Losing weight if necessary.  Not smoking.  Limiting alcoholic beverages.  Learning ways to reduce stress. Your health care provider may prescribe medicine if lifestyle changes are not enough to get your blood pressure under control, and if one of the following is true:  You are 18-59 years  of age and your systolic blood pressure is above 140.  You are 32 years of age or older, and your systolic blood pressure is above 150.  Your diastolic blood pressure is above 90.  You have diabetes, and your systolic blood pressure is over 952 or your diastolic blood pressure is over 90.  You have kidney disease and your blood  pressure is above 140/90.  You have heart disease and your blood pressure is above 140/90. Your personal target blood pressure may vary depending on your medical conditions, your age, and other factors. HOME CARE INSTRUCTIONS  Have your blood pressure rechecked as directed by your health care provider.   Take medicines only as directed by your health care provider. Follow the directions carefully. Blood pressure medicines must be taken as prescribed. The medicine does not work as well when you skip doses. Skipping doses also puts you at risk for problems.  Do not smoke.   Monitor your blood pressure at home as directed by your health care provider. SEEK MEDICAL CARE IF:   You think you are having a reaction to medicines taken.  You have recurrent headaches or feel dizzy.  You have swelling in your ankles.  You have trouble with your vision. SEEK IMMEDIATE MEDICAL CARE IF:  You develop a severe headache or confusion.  You have unusual weakness, numbness, or feel faint.  You have severe chest or abdominal pain.  You vomit repeatedly.  You have trouble breathing. MAKE SURE YOU:   Understand these instructions.  Will watch your condition.  Will get help right away if you are not doing well or get worse.   This information is not intended to replace advice given to you by your health care provider. Make sure you discuss any questions you have with your health care provider.   Document Released: 01/31/2005 Document Revised: 06/17/2014 Document Reviewed: 11/23/2012 Elsevier Interactive Patient Education 2016 [place].  Hyperglycemia Hyperglycemia occurs when the glucose (sugar) in your blood is too high. Hyperglycemia can happen for many reasons, but it most often happens to people who do not know they have diabetes or are not managing their diabetes properly.  CAUSES  Whether you have diabetes or not, there are other causes of hyperglycemia. Hyperglycemia can occur  when you have diabetes, but it can also occur in other situations that you might not be as aware of, such as: Diabetes  If you have diabetes and are having problems controlling your blood glucose, hyperglycemia could occur because of some of the following reasons:  Not following your meal plan.  Not taking your diabetes medications or not taking it properly.  Exercising less or doing less activity than you normally do.  Being sick. Pre-diabetes  This cannot be ignored. Before people develop Type 2 diabetes, they almost always have "pre-diabetes." This is when your blood glucose levels are higher than normal, but not yet high enough to be diagnosed as diabetes. Research has shown that some long-term damage to the body, especially the heart and circulatory system, may already be occurring during pre-diabetes. If you take action to manage your blood glucose when you have pre-diabetes, you may delay or prevent Type 2 diabetes from developing. Stress  If you have diabetes, you may be "diet" controlled or on oral medications or insulin to control your diabetes. However, you may find that your blood glucose is higher than usual in the hospital whether you have diabetes or not. This is often referred to as "stress hyperglycemia."  Stress can elevate your blood glucose. This happens because of hormones put out by the body during times of stress. If stress has been the cause of your high blood glucose, it can be followed regularly by your caregiver. That way he/she can make sure your hyperglycemia does not continue to get worse or progress to diabetes. Steroids  Steroids are medications that act on the infection fighting system (immune system) to block inflammation or infection. One side effect can be a rise in blood glucose. Most people can produce enough extra insulin to allow for this rise, but for those who cannot, steroids make blood glucose levels go even higher. It is not unusual for steroid  treatments to "uncover" diabetes that is developing. It is not always possible to determine if the hyperglycemia will go away after the steroids are stopped. A special blood test called an A1c is sometimes done to determine if your blood glucose was elevated before the steroids were started. SYMPTOMS  Thirsty.  Frequent urination.  Dry mouth.  Blurred vision.  Tired or fatigue.  Weakness.  Sleepy.  Tingling in feet or leg. DIAGNOSIS  Diagnosis is made by monitoring blood glucose in one or all of the following ways:  A1c test. This is a chemical found in your blood.  Fingerstick blood glucose monitoring.  Laboratory results. TREATMENT  First, knowing the cause of the hyperglycemia is important before the hyperglycemia can be treated. Treatment may include, but is not be limited to:  Education.  Change or adjustment in medications.  Change or adjustment in meal plan.  Treatment for an illness, infection, etc.  More frequent blood glucose monitoring.  Change in exercise plan.  Decreasing or stopping steroids.  Lifestyle changes. HOME CARE INSTRUCTIONS   Test your blood glucose as directed.  Exercise regularly. Your caregiver will give you instructions about exercise. Pre-diabetes or diabetes which comes on with stress is helped by exercising.  Eat wholesome, balanced meals. Eat often and at regular, fixed times. Your caregiver or nutritionist will give you a meal plan to guide your sugar intake.  Being at an ideal weight is important. If needed, losing as little as 10 to 15 pounds may help improve blood glucose levels. SEEK MEDICAL CARE IF:   You have questions about medicine, activity, or diet.  You continue to have symptoms (problems such as increased thirst, urination, or weight gain). SEEK IMMEDIATE MEDICAL CARE IF:   You are vomiting or have diarrhea.  Your breath smells fruity.  You are breathing faster or slower.  You are very sleepy or  incoherent.  You have numbness, tingling, or pain in your feet or hands.  You have chest pain.  Your symptoms get worse even though you have been following your caregiver's orders.  If you have any other questions or concerns.   This information is not intended to replace advice given to you by your health care provider. Make sure you discuss any questions you have with your health care provider.   Document Released: 07/27/2000 Document Revised: 04/25/2011 Document Reviewed: 10/07/2014 Elsevier Interactive Patient Education 2016 [employer]  Nonspecific Chest Pain  Chest pain can be caused by many different conditions. There is always a chance that your pain could be related to something serious, such as a heart attack or a blood clot in your lungs. Chest pain can also be caused by conditions that are not life-threatening. If you have chest pain, it is very important to follow up with your health care provider.  CAUSES  Chest pain can be caused by:  Heartburn.  Pneumonia or bronchitis.  Anxiety or stress.  Inflammation around your heart (pericarditis) or lung (pleuritis or pleurisy).  A blood clot in your lung.  A collapsed lung (pneumothorax). It can develop suddenly on its own (spontaneous pneumothorax) or from trauma to the chest.  Shingles infection (varicella-zoster virus).  Heart attack.  Damage to the bones, muscles, and cartilage that make up your chest wall. This can include:  Bruised bones due to injury.  Strained muscles or cartilage due to frequent or repeated coughing or overwork.  Fracture to one or more ribs.  Sore cartilage due to inflammation (costochondritis). RISK FACTORS  Risk factors for chest pain may include:  Activities that increase your risk for trauma or injury to your chest.  Respiratory infections or conditions that cause frequent coughing.  Medical conditions or overeating that can cause heartburn.  Heart disease or family history  of heart disease.  Conditions or health behaviors that increase your risk of developing a blood clot.  Having had chicken pox (varicella zoster). SIGNS AND SYMPTOMS Chest pain can feel like:  Burning or tingling on the surface of your chest or deep in your chest.  Crushing, pressure, aching, or squeezing pain.  Dull or sharp pain that is worse when you move, cough, or take a deep breath.  Pain that is also felt in your back, neck, shoulder, or arm, or pain that spreads to any of these areas. Your chest pain may come and go, or it may stay constant. DIAGNOSIS Lab tests or other studies may be needed to find the cause of your pain. Your health care provider may have you take a test called an ambulatory ECG (electrocardiogram). An ECG records your heartbeat patterns at the time the test is performed. You may also have other tests, such as:  Transthoracic echocardiogram (TTE). During echocardiography, sound waves are used to create a picture of all of the heart structures and to look at how blood flows through your heart.  Transesophageal echocardiogram (TEE).This is a more advanced imaging test that obtains images from inside your body. It allows your health care provider to see your heart in finer detail.  Cardiac monitoring. This allows your health care provider to monitor your heart rate and rhythm in real time.  Holter monitor. This is a portable device that records your heartbeat and can help to diagnose abnormal heartbeats. It allows your health care provider to track your heart activity for several days, if needed.  Stress tests. These can be done through exercise or by taking medicine that makes your heart beat more quickly.  Blood tests.  Imaging tests. TREATMENT  Your treatment depends on what is causing your chest pain. Treatment may include:  Medicines. These may include:  Acid blockers for heartburn.  Anti-inflammatory medicine.  Pain medicine for inflammatory  conditions.  Antibiotic medicine, if an infection is present.  Medicines to dissolve blood clots.  Medicines to treat coronary artery disease.  Supportive care for conditions that do not require medicines. This may include:  Resting.  Applying heat or cold packs to injured areas.  Limiting activities until pain decreases. HOME CARE INSTRUCTIONS  If you were prescribed an antibiotic medicine, finish it all even if you start to feel better.  Avoid any activities that bring on chest pain.  Do not use any tobacco products, including cigarettes, chewing tobacco, or electronic cigarettes. If you need help quitting, ask your health care provider.  Do not drink alcohol.  Take medicines only as directed by your health care provider.  Keep all follow-up visits as directed by your health care provider. This is important. This includes any further testing if your chest pain does not go away.  If heartburn is the cause for your chest pain, you may be told to keep your head raised (elevated) while sleeping. This reduces the chance that acid will go from your stomach into your esophagus.  Make lifestyle changes as directed by your health care provider. These may include:  Getting regular exercise. Ask your health care provider to suggest some activities that are safe for you.  Eating a heart-healthy diet. A registered dietitian can help you to learn healthy eating options.  Maintaining a healthy weight.  Managing diabetes, if necessary.  Reducing stress. SEEK MEDICAL CARE IF:  Your chest pain does not go away after treatment.  You have a rash with blisters on your chest.  You have a fever. SEEK IMMEDIATE MEDICAL CARE IF:   Your chest pain is worse.  You have an increasing cough, or you cough up blood.  You have severe abdominal pain.  You have severe weakness.  You faint.  You have chills.  You have sudden, unexplained chest discomfort.  You have sudden, unexplained  discomfort in your arms, back, neck, or jaw.  You have shortness of breath at any time.  You suddenly start to sweat, or your skin gets clammy.  You feel nauseous or you vomit.  You suddenly feel light-headed or dizzy.  Your heart begins to beat quickly, or it feels like it is skipping beats. These symptoms may represent a serious problem that is an emergency. Do not wait to see if the symptoms will go away. Get medical help right away. Call your local emergency services (911 in the [place] Do not drive yourself to the hospital.   This information is not intended to replace advice given to you by your health care provider. Make sure you discuss any questions you have with your health care provider.   Document Released: 11/10/2004 Document Revised: 02/21/2014 Document Reviewed: 09/06/2013 Elsevier Interactive Patient Education [employer].

## 2015-02-24 NOTE — ED Provider Notes (Signed)
[facility] Emergency Department Provider Note  ____________________________________________  Time seen: Approximately 5:23 PM  I have reviewed the triage vital signs and the nursing notes.   HISTORY  Chief Complaint Chest Pain    HPI Amber Christensen is a 50 y.o. female who reports an extensive past medical history including diabetes, hypertension, liver disease, and chronic pancreatitis who presents with acute onset of worsening chest pain.  She does report that she has had intermittent chest pain for about 2 weeks but that it got acutely worse this afternoon.  She describes it as sharp and stabbing and associated with some shortness of breath.  She describes being tremulousin the ambulance.  She has not taken her insulin today.  She has had some nausea.  At the time that she was having the worst of the chest pain which she describes as severe, she is also having sensation of chest pressure, shortness of breath, and she took her blood pressure and it was consistently in the 220s over 130s range.  She called her nurse who recommended she come to the emergency department.  Upon arrival in the emergency department her blood pressure is elevated although not to the same degree that her blood pressure at home was elevated.  She is consistently in the 831D to 176H systolic and greater than 607 diastolic.  She does say that her chest pain has improved since the blood pressures come down slightly.  She is not having any acute abdominal pain although she always has some degree of chronic abdominal pain due to liver disease and chronic pancreatitis.  Other she voices a number of concerns and complaints, she is well-appearing and in no acute distress during our evaluation.  She denies nausea/vomiting, fever/chills, dysuria.   Past Medical History  Diagnosis Date  . Diabetes mellitus without complication ([place])   . Hypertension   . Liver disease   . GERD (gastroesophageal reflux  disease)     There are no active problems to display for this patient.   Past Surgical History  Procedure Laterality Date  . Pancreas surgery    . Dilatation & curettage/hysteroscopy with myosure    . Cholecystectomy    . Appendectomy    . Abdominal hysterectomy    . Bowel resection      Current Outpatient Rx  Name  Route  Sig  Dispense  Refill  . amLODipine (NORVASC) 5 MG tablet   Oral   Take 5 mg by mouth daily.         Marland Kitchen aspirin EC 81 MG tablet   Oral   Take 81 mg by mouth daily.         . cloNIDine (CATAPRES) 0.1 MG tablet   Oral   Take 0.1 mg by mouth 2 (two) times daily.         . insulin glargine (LANTUS) 100 UNIT/ML injection   Subcutaneous   Inject 9 Units into the skin daily.         . insulin lispro (HUMALOG) 100 UNIT/ML injection   Subcutaneous   Inject 1-5 Units into the skin 3 (three) times daily before meals. Pt uses per sliding scale:    130-180:  1 units  181-230:  2 units  231-280:  3 units  281-330:  4 units  Over 331:  5 units         . lipase/protease/amylase (CREON) 36000 UNITS CPEP capsule   Oral   Take 36,000-72,000 Units by mouth 5 (five) times daily.  Pt takes two capsules three times a day with meals and one capsule two times a day with snacks.         Marland Kitchen lisinopril (PRINIVIL,ZESTRIL) 20 MG tablet   Oral   Take 20 mg by mouth daily.         . pantoprazole (PROTONIX) 40 MG tablet   Oral   Take 40 mg by mouth 2 (two) times daily.         . sucralfate (CARAFATE) 1 g tablet   Oral   Take 1 g by mouth 2 (two) times daily before a meal.         . vitamin E 400 UNIT capsule   Oral   Take 400 Units by mouth daily.           Allergies Demerol; Dilaudid; Morphine and related; Darvon; Flexeril; Levaquin; Soma; and Zofran  History reviewed. No pertinent family history.  Social History Social History  Substance Use Topics  . Smoking status: Never Smoker   . Smokeless tobacco: None  . Alcohol Use: No     Review of Systems Constitutional: No fever/chills Eyes: No visual changes. ENT: No sore throat. Cardiovascular: Chest pain and pressure, possibly associated with hypertension Respiratory: shortness of breath possibly associated with hypertension Gastrointestinal: Chronic abdominal pain.  No nausea, no vomiting.  No diarrhea.  No constipation. Genitourinary: Negative for dysuria. Musculoskeletal: Negative for back pain. Skin: Negative for rash. Neurological: Negative for headaches, focal weakness or numbness.  10-point ROS otherwise negative.  ____________________________________________   PHYSICAL EXAM:  VITAL SIGNS: ED Triage Vitals  Enc Vitals Group     BP 02/24/15 1535 186/116 mmHg     Pulse Rate 02/24/15 1535 86     Resp 02/24/15 1535 22     Temp 02/24/15 1535 98.9 F (37.2 C)     Temp Source 02/24/15 1535 Oral     SpO2 02/24/15 1535 98 %     Weight 02/24/15 1716 138 lb (62.596 kg)     Height 02/24/15 1716 5' 4"  (1.626 m)     Head Cir --      Peak Flow --      Pain Score 02/24/15 1537 6     Pain Loc --      Pain Edu? --      Excl. in [place]? --     Constitutional: Alert and oriented. Well appearing and in no acute distress. Eyes: Conjunctivae are normal. PERRL. EOMI.  Scleral icterus. Head: Atraumatic. Nose: No congestion/rhinnorhea. Mouth/Throat: Mucous membranes are moist.  Oropharynx non-erythematous. Neck: No stridor.   Cardiovascular: Normal rate, regular rhythm. Grossly normal heart sounds.  Good peripheral circulation. Respiratory: Normal respiratory effort.  No retractions. Lungs CTAB. Gastrointestinal: Mild TTP throughout abdomen with no focality.  No distention. No abdominal bruits. No CVA tenderness. Musculoskeletal: No lower extremity tenderness nor edema.  No joint effusions. Neurologic:  Normal speech and language. No gross focal neurologic deficits are appreciated.  Skin:  Skin is warm, dry and intact. No rash  noted.  ____________________________________________   LABS (all labs ordered are listed, but only abnormal results are displayed)  Labs Reviewed  COMPREHENSIVE METABOLIC PANEL - Abnormal; Notable for the following:    Potassium 3.3 (*)    Chloride 100 (*)    Glucose, Bld 416 (*)    Calcium 8.1 (*)    Total Bilirubin 4.9 (*)    All other components within normal limits  MAGNESIUM - Abnormal; Notable for the following:  Magnesium 1.4 (*)    All other components within normal limits  PROTIME-INR - Abnormal; Notable for the following:    Prothrombin Time 15.2 (*)    All other components within normal limits  CBC WITH DIFFERENTIAL/PLATELET - Abnormal; Notable for the following:    Platelets 146 (*)    All other components within normal limits  TROPONIN I  TROPONIN I  LIPASE, BLOOD   ____________________________________________  EKG  ED ECG REPORT I, Allix Blomquist, the attending physician, personally viewed and interpreted this ECG.  Date: 02/24/2015 EKG Time: 15:27 Rate: 85 Rhythm: normal sinus rhythm QRS Axis: normal Intervals: normal ST/T Wave abnormalities: normal Conduction Disutrbances: none Narrative Interpretation: unremarkable  ____________________________________________  RADIOLOGY   Dg Chest Portable 1 View  02/24/2015  CLINICAL DATA:  Increasing left-sided chest pain, weakness and nausea. EXAM: PORTABLE CHEST 1 VIEW COMPARISON:  12/03/2013 FINDINGS: The heart size and mediastinal contours are within normal limits. Both lungs are clear. The visualized skeletal structures are unremarkable. IMPRESSION: No active disease. Electronically Signed   By: Rolm Baptise M.D.   On: 02/24/2015 16:04    ____________________________________________   PROCEDURES  Procedure(s) performed: None  Critical Care performed: No ____________________________________________   INITIAL IMPRESSION / ASSESSMENT AND PLAN / ED COURSE  Pertinent labs & imaging results that  were available during my care of the patient were reviewed by me and considered in my medical decision making (see chart for details).  It is difficult to determine whether the patient's symptoms are the result of acute hypertension, or whether the hypertension as a result of the patient's symptoms.  She does not seem to have any acute intra-abdominal pathology at this time.  Her vital signs are reassuring with no hypoxemia, no tachycardia, no tachypnea; nothing to make me concern for pulmonary embolism.  ACS is possible but unlikely given a normal EKG.  I will evaluate broadly and reassess.  ----------------------------------------- 8:04 PM on 02/24/2015 -----------------------------------------  The patient states she is feeling better but her blood pressure remains elevated.  Given the possibility her symptoms may be caused by acutely elevated blood pressure and that she states her blood pressures usually in the 120s to 045W range systolic, I will give her a small dose of labetalol 10 mg IV and see if this helps both her blood pressure and her discomfort.  She is hyperglycemic but looking back at her labs she is frequently hyperglycemic and I treated with 1 L normal saline.  Additionally she did not have her insulin earlier today so I doubt that this is the cause of her symptoms.    ----------------------------------------- 9:14 PM on 02/24/2015 -----------------------------------------  The patient is asymptomatic at this time.  Her blood pressure remains elevated at 165/100, and the labetalol did not significantly change her symptoms or blood pressure.  However, I do not believe that the blood pressure is the cause of her constellation of symptoms today and I did not him afraid that changing her medications is more likely to cause harm than to help.  She is comfortable with the workup she has received and wants to follow-up as an outpatient.  I do not believe that she has an acute or emergent  illness or condition and she is appropriate for outpatient follow-up.  Her extensive workup has been unremarkable except for hypomagnesemia for which I gave HER-2 grams of magnesium and her hypertension and hyperglycemia which I addressed above.  I had an extensive discussion with the patient and she will call her  primary care doctor in the morning to schedule the next available visit.  I gave my usual and customary return precautions.     ____________________________________________  FINAL CLINICAL IMPRESSION(S) / ED DIAGNOSES  Final diagnoses:  Atypical chest pain  Essential hypertension  Hyperbilirubinemia  Type 2 diabetes mellitus with hyperglycemia, with long-term current use of insulin (HCC)  Hypomagnesemia      NEW MEDICATIONS STARTED DURING THIS VISIT:  New Prescriptions   No medications on file     Modified Medications   No medications on file     Discontinued Medications   METOCLOPRAMIDE (REGLAN) 10 MG TABLET    Take 1 tablet (10 mg total) by mouth every 8 (eight) hours as needed for nausea or vomiting.      Hinda Kehr, MD 02/24/15 2145

## 2015-02-27 ENCOUNTER — Emergency Department: Payer: Medicare PPO

## 2015-02-27 ENCOUNTER — Emergency Department
Admission: EM | Admit: 2015-02-27 | Discharge: 2015-02-27 | Disposition: A | Discharge status: Home / Self Care | Payer: Medicare PPO | Attending: Emergency Medicine | Admitting: Emergency Medicine

## 2015-02-27 ENCOUNTER — Other Ambulatory Visit: Payer: Self-pay

## 2015-02-27 ENCOUNTER — Encounter: Payer: Self-pay | Admitting: Emergency Medicine

## 2015-02-27 DIAGNOSIS — G8929 Other chronic pain: Secondary | ICD-10-CM | POA: Diagnosis not present

## 2015-02-27 DIAGNOSIS — H538 Other visual disturbances: Secondary | ICD-10-CM | POA: Diagnosis not present

## 2015-02-27 DIAGNOSIS — H5711 Ocular pain, right eye: Secondary | ICD-10-CM | POA: Diagnosis not present

## 2015-02-27 DIAGNOSIS — Z794 Long term (current) use of insulin: Secondary | ICD-10-CM | POA: Diagnosis not present

## 2015-02-27 DIAGNOSIS — R1010 Upper abdominal pain, unspecified: Secondary | ICD-10-CM | POA: Diagnosis not present

## 2015-02-27 DIAGNOSIS — I10 Essential (primary) hypertension: Secondary | ICD-10-CM | POA: Insufficient documentation

## 2015-02-27 DIAGNOSIS — R079 Chest pain, unspecified: Secondary | ICD-10-CM | POA: Diagnosis present

## 2015-02-27 DIAGNOSIS — R609 Edema, unspecified: Secondary | ICD-10-CM | POA: Diagnosis not present

## 2015-02-27 DIAGNOSIS — Z79899 Other long term (current) drug therapy: Secondary | ICD-10-CM | POA: Diagnosis not present

## 2015-02-27 DIAGNOSIS — E119 Type 2 diabetes mellitus without complications: Secondary | ICD-10-CM | POA: Insufficient documentation

## 2015-02-27 DIAGNOSIS — Z7982 Long term (current) use of aspirin: Secondary | ICD-10-CM | POA: Diagnosis not present

## 2015-02-27 LAB — BASIC METABOLIC PANEL
Anion gap: 9 (ref 5–15)
BUN: 11 mg/dL (ref 6–20)
CO2: 30 mmol/L (ref 22–32)
Calcium: 8.6 mg/dL — ABNORMAL LOW (ref 8.9–10.3)
Chloride: 103 mmol/L (ref 101–111)
Creatinine, Ser: 0.9 mg/dL (ref 0.44–1.00)
GFR calc Af Amer: >60 mL/min (ref >60)
GFR calc non Af Amer: >60 mL/min (ref >60)
Glucose, Bld: 58 mg/dL — ABNORMAL LOW (ref 65–99)
Potassium: 2.9 mmol/L — CL (ref 3.5–5.1)
Sodium: 142 mmol/L (ref 135–145)

## 2015-02-27 LAB — GLUCOSE, CAPILLARY
Glucose-Capillary: 72 mg/dL (ref 65–99)
Glucose-Capillary: 136 mg/dL — ABNORMAL HIGH (ref 65–99)

## 2015-02-27 LAB — CBC
HCT: 40.4 % (ref 35.0–47.0)
Hemoglobin: 13.9 g/dL (ref 12.0–16.0)
MCH: 29.7 pg (ref 26.0–34.0)
MCHC: 34.3 g/dL (ref 32.0–36.0)
MCV: 86.8 fL (ref 80.0–100.0)
Platelets: 202 10*3/uL (ref 150–440)
RBC: 4.66 MIL/uL (ref 3.80–5.20)
RDW: 13.1 % (ref 11.5–14.5)
WBC: 12.4 10*3/uL — ABNORMAL HIGH (ref 3.6–11.0)

## 2015-02-27 LAB — HEPATIC FUNCTION PANEL
ALT: 40 U/L (ref 14–54)
AST: 26 U/L (ref 15–41)
Albumin: 4.2 g/dL (ref 3.5–5.0)
Alkaline Phosphatase: 119 U/L (ref 38–126)
Bilirubin, Direct: 0.3 mg/dL (ref 0.1–0.5)
Indirect Bilirubin: 4 mg/dL — ABNORMAL HIGH (ref 0.3–0.9)
Total Bilirubin: 4.3 mg/dL — ABNORMAL HIGH (ref 0.3–1.2)
Total Protein: 7 g/dL (ref 6.5–8.1)

## 2015-02-27 LAB — LIPASE, BLOOD: Lipase: 15 U/L (ref 11–51)

## 2015-02-27 LAB — TROPONIN I: Troponin I: <0.03 ng/mL (ref <0.031)

## 2015-02-27 LAB — FIBRIN DERIVATIVES D-DIMER (ARMC ONLY): Fibrin derivatives D-dimer (ARMC): 302 (ref 0–499)

## 2015-02-27 LAB — BRAIN NATRIURETIC PEPTIDE: B Natriuretic Peptide: 61 pg/mL (ref 0.0–100.0)

## 2015-02-27 LAB — MAGNESIUM: Magnesium: 1.4 mg/dL — ABNORMAL LOW (ref 1.7–2.4)

## 2015-02-27 MED ORDER — FLUORESCEIN SODIUM 1 MG OP STRP
1.0000 | ORAL_STRIP | Freq: Once | OPHTHALMIC | Status: AC
  Administered 2015-02-27: 1 via OPHTHALMIC
  Filled 2015-02-27: qty 1

## 2015-02-27 MED ORDER — POTASSIUM CHLORIDE 20 MEQ/15ML (10%) PO SOLN
40.0000 meq | Freq: Once | ORAL | Status: AC
  Administered 2015-02-27: 40 meq via ORAL
  Filled 2015-02-27: qty 30

## 2015-02-27 MED ORDER — PROMETHAZINE HCL 25 MG PO TABS
25.0000 mg | ORAL_TABLET | Freq: Once | ORAL | Status: AC
  Administered 2015-02-27: 25 mg via ORAL
  Filled 2015-02-27: qty 1

## 2015-02-27 MED ORDER — PROMETHAZINE HCL 25 MG/ML IJ SOLN
12.5000 mg | Freq: Once | INTRAMUSCULAR | Status: DC
  Filled 2015-02-27: qty 1

## 2015-02-27 MED ORDER — TETRACAINE HCL 0.5 % OP SOLN
1.0000 [drp] | Freq: Once | OPHTHALMIC | Status: AC
  Administered 2015-02-27: 1 [drp] via OPHTHALMIC
  Filled 2015-02-27: qty 2

## 2015-02-27 MED ORDER — KCL IN DEXTROSE-NACL 20-5-0.9 MEQ/L-%-% IV SOLN
INTRAVENOUS | Status: DC
  Filled 2015-02-27: qty 1000

## 2015-02-27 NOTE — ED Provider Notes (Signed)
[facility] Emergency Department Provider Note  ____________________________________________  Time seen: Approximately 8:48 PM  I have reviewed the triage vital signs and the nursing notes.   HISTORY  Chief Complaint Dizziness; Chest Pain; and Eye Pain    HPI Amber Christensen is a 50 y.o. female patient comes in reports, complicated medical history including jaundice with liver abnormalities chronic pancreatitis with resection of 80% of her pancreas and now has developed intermittent chest pain lasting 5 minutes or so tight and heavy comes and goes by itself not related to exertion. Patient reports she has however got short of breath with exertion and worse today. She recently on a long car trip to [place]. Her blood pressure is been fluctuating from a high of 222 low of 70 up and down for days. So has developed pain in the right eye is not worse with light her vision is blurry bilaterally not any different in the right eye. Some injection of the right eye. The chest pain is moderate to severe when it happens she is not having any right now .    Past Medical History  Diagnosis Date  . Diabetes mellitus without complication ([place])   . Hypertension   . Liver disease   . GERD (gastroesophageal reflux disease)     There are no active problems to display for this patient.   Past Surgical History  Procedure Laterality Date  . Pancreas surgery    . Dilatation & curettage/hysteroscopy with myosure    . Cholecystectomy    . Appendectomy    . Abdominal hysterectomy    . Bowel resection      Current Outpatient Rx  Name  Route  Sig  Dispense  Refill  . amLODipine (NORVASC) 5 MG tablet   Oral   Take 5 mg by mouth daily.         Marland Kitchen aspirin EC 81 MG tablet   Oral   Take 81 mg by mouth daily.         . cloNIDine (CATAPRES) 0.1 MG tablet   Oral   Take 0.1 mg by mouth 2 (two) times daily.         . insulin glargine (LANTUS) 100 UNIT/ML injection  Subcutaneous   Inject 10 Units into the skin at bedtime.          . insulin lispro (HUMALOG) 100 UNIT/ML injection   Subcutaneous   Inject into the skin 3 (three) times daily with meals as needed for high blood sugar. Pt uses based off the amount of carbs she eats at each meal.         . lipase/protease/amylase (CREON) 36000 UNITS CPEP capsule   Oral   Take 36,000-72,000 Units by mouth 5 (five) times daily. Pt takes two capsules three times a day with meals and one capsule two times a day with snacks.         Marland Kitchen lisinopril (PRINIVIL,ZESTRIL) 20 MG tablet   Oral   Take 20 mg by mouth daily.         . pantoprazole (PROTONIX) 40 MG tablet   Oral   Take 40 mg by mouth 2 (two) times daily.         . sucralfate (CARAFATE) 1 g tablet   Oral   Take 1 g by mouth 2 (two) times daily before a meal.         . vitamin E 400 UNIT capsule   Oral   Take 400 Units by mouth  daily.           Allergies Demerol; Dilaudid; Morphine and related; Darvon; Flexeril; Levaquin; Soma; and Zofran  No family history on file.  Social History Social History  Substance Use Topics  . Smoking status: Never Smoker   . Smokeless tobacco: None  . Alcohol Use: No    Review of Systems Constitutional: No fever/chills Eyes: See history of present illness ENT: No sore throat. Cardiovascular: See history of present illness Respiratory: The history of present illness Gastrointestinal: The history of present illness Genitourinary: Negative for dysuria. Musculoskeletal: Negative for back pain. Skin: Negative for rash. Neurological: Negative for headaches, focal weakness or numbness.  10-point ROS otherwise negative.  ____________________________________________   PHYSICAL EXAM:  VITAL SIGNS: ED Triage Vitals  Enc Vitals Group     BP 02/27/15 1723 165/91 mmHg     Pulse Rate 02/27/15 1723 99     Resp 02/27/15 1723 18     Temp 02/27/15 1723 98.3 F (36.8 C)     Temp Source 02/27/15  1723 Oral     SpO2 02/27/15 1723 100 %     Weight 02/27/15 1723 137 lb (62.143 kg)     Height 02/27/15 1723 5' 4"  (1.626 m)     Head Cir --      Peak Flow --      Pain Score 02/27/15 1733 7     Pain Loc --      Pain Edu? --      Excl. in [place]? --     Constitutional: Alert and oriented. Well appearing and in no acute distress. Eyes: Conjunctivae are normal except for some slight injection in the right lower outer corner of the eye the right eye.Marland Kitchen PERRL. EOMI. Head: Atraumatic. Nose: No congestion/rhinnorhea. Mouth/Throat: Mucous membranes are moist.  Oropharynx non-erythematous. Neck: No stridor.  Cardiovascular: Normal rate, regular rhythm. Grossly normal heart sounds.  Good peripheral circulation. Respiratory: Normal respiratory effort.  No retractions. Lungs CTAB. Gastrointestinal: Soft chronically tender in the mid and upper abdomen.. No distention. No abdominal bruits. No CVA tenderness. Musculoskeletal: No lower extremity tenderness 1+ edema and no legs    .  No joint effusions. Neurologic:  Normal speech and language. No gross focal neurologic deficits are appreciated. No gait instability. Skin:  Skin is warm, dry and intact. No rash noted. Psychiatric: Mood and affect are normal. Speech and behavior are normal.  ____________________________________________   LABS (all labs ordered are listed, but only abnormal results are displayed)  Labs Reviewed  BASIC METABOLIC PANEL - Abnormal; Notable for the following:    Potassium 2.9 (*)    Glucose, Bld 58 (*)    Calcium 8.6 (*)    All other components within normal limits  CBC - Abnormal; Notable for the following:    WBC 12.4 (*)    All other components within normal limits  MAGNESIUM - Abnormal; Notable for the following:    Magnesium 1.4 (*)    All other components within normal limits  HEPATIC FUNCTION PANEL - Abnormal; Notable for the following:    Total Bilirubin 4.3 (*)    Indirect Bilirubin 4.0 (*)    All other  components within normal limits  GLUCOSE, CAPILLARY - Abnormal; Notable for the following:    Glucose-Capillary 136 (*)    All other components within normal limits  TROPONIN I  FIBRIN DERIVATIVES D-DIMER ([facility] ONLY)  GLUCOSE, CAPILLARY  LIPASE, BLOOD  BRAIN NATRIURETIC PEPTIDE  TROPONIN I   ____________________________________________  EKG  EKG read and interpreted by me shows normal sinus rhythm rate of 84 normal axis possibly LVH otherwise essentially normal ____________________________________________  RADIOLOGY  Chest x-ray is negative ____________________________________________   PROCEDURES   Nurse reports visual acuity is 20/40 bilaterally patient can read the things the characters with her right eye more easily than with the left.  Pain improved. I then numbed the eye and found that floriscene is neg. ____________________________________________   INITIAL IMPRESSION / ASSESSMENT AND PLAN / ED COURSE  Pertinent labs & imaging results that were available during my care of the patient were reviewed by me and considered in my medical decision making (see chart for details).  Discussed with Dr. Desmond Dike TAH. He will follow-up the patient on Monday. Patient has had 3 negative troponins since the 10th of this month. EKG is essentially unchanged. Pain is unrelated to effort. The NP and d-dimer also negative. Patient's eye pain improved markedly before anesthetic drops. Vision is same bilateral. Dr. Oval Linsey the ophthalmologist will follow up her vision I spoke with her as well. She is also to follow-up with her regular doctor and return for any further problems. ____________________________________________   FINAL CLINICAL IMPRESSION(S) / ED DIAGNOSES  Final diagnoses:  Chest pain, unspecified chest pain type      Nena Polio, MD 02/27/15 2250

## 2015-02-27 NOTE — ED Notes (Signed)
Reviewed d/c instructions and follow-up care with pt. Pt verbalized understanding 

## 2015-02-27 NOTE — ED Notes (Signed)
Called pharmacy to request patient's potassium

## 2015-02-27 NOTE — ED Notes (Signed)
Unable to obtain blood from iv access. Pt will need lab to come and draw.

## 2015-02-27 NOTE — ED Notes (Signed)
MD Malinda ordered 4 ounces of juice to correct CBG. Administered 4 ounces orange juice to patient

## 2015-03-27 ENCOUNTER — Encounter: Payer: Self-pay | Admitting: Emergency Medicine

## 2015-03-27 ENCOUNTER — Emergency Department: Payer: Medicare PPO

## 2015-03-27 ENCOUNTER — Emergency Department
Admission: EM | Admit: 2015-03-27 | Discharge: 2015-03-27 | Disposition: A | Discharge status: Home / Self Care | Payer: Medicare PPO | Attending: Emergency Medicine | Admitting: Emergency Medicine

## 2015-03-27 DIAGNOSIS — E119 Type 2 diabetes mellitus without complications: Secondary | ICD-10-CM | POA: Diagnosis not present

## 2015-03-27 DIAGNOSIS — R112 Nausea with vomiting, unspecified: Secondary | ICD-10-CM | POA: Diagnosis not present

## 2015-03-27 DIAGNOSIS — I10 Essential (primary) hypertension: Secondary | ICD-10-CM | POA: Diagnosis not present

## 2015-03-27 DIAGNOSIS — K5900 Constipation, unspecified: Secondary | ICD-10-CM | POA: Diagnosis not present

## 2015-03-27 DIAGNOSIS — R109 Unspecified abdominal pain: Secondary | ICD-10-CM | POA: Diagnosis present

## 2015-03-27 LAB — CBC
HCT: 41.4 % (ref 35.0–47.0)
Hemoglobin: 14.2 g/dL (ref 12.0–16.0)
MCH: 30.5 pg (ref 26.0–34.0)
MCHC: 34.4 g/dL (ref 32.0–36.0)
MCV: 88.6 fL (ref 80.0–100.0)
Platelets: 189 10*3/uL (ref 150–440)
RBC: 4.67 MIL/uL (ref 3.80–5.20)
RDW: 13.5 % (ref 11.5–14.5)
WBC: 9.1 10*3/uL (ref 3.6–11.0)

## 2015-03-27 LAB — COMPREHENSIVE METABOLIC PANEL
ALT: 45 U/L (ref 14–54)
AST: 36 U/L (ref 15–41)
Albumin: 4.6 g/dL (ref 3.5–5.0)
Alkaline Phosphatase: 119 U/L (ref 38–126)
Anion gap: 8 (ref 5–15)
BUN: 18 mg/dL (ref 6–20)
CO2: 26 mmol/L (ref 22–32)
Calcium: 8.9 mg/dL (ref 8.9–10.3)
Chloride: 106 mmol/L (ref 101–111)
Creatinine, Ser: 1.05 mg/dL — ABNORMAL HIGH (ref 0.44–1.00)
GFR calc Af Amer: >60 mL/min (ref >60)
GFR calc non Af Amer: >60 mL/min (ref >60)
Glucose, Bld: 119 mg/dL — ABNORMAL HIGH (ref 65–99)
Potassium: 4 mmol/L (ref 3.5–5.1)
Sodium: 140 mmol/L (ref 135–145)
Total Bilirubin: 4.4 mg/dL — ABNORMAL HIGH (ref 0.3–1.2)
Total Protein: 7.6 g/dL (ref 6.5–8.1)

## 2015-03-27 LAB — GLUCOSE, CAPILLARY: Glucose-Capillary: 89 mg/dL (ref 65–99)

## 2015-03-27 LAB — LIPASE, BLOOD: Lipase: 17 U/L (ref 11–51)

## 2015-03-27 MED ORDER — PROMETHAZINE HCL 25 MG/ML IJ SOLN
INTRAMUSCULAR | Status: AC
  Administered 2015-03-27: 25 mg via INTRAVENOUS
  Filled 2015-03-27: qty 1

## 2015-03-27 MED ORDER — PROMETHAZINE HCL 25 MG PO TABS: 25.0000 mg | ORAL_TABLET | Freq: Four times a day (QID) | ORAL | Status: DC | PRN

## 2015-03-27 MED ORDER — IOHEXOL 240 MG/ML SOLN
25.0000 mL | Freq: Once | INTRAMUSCULAR | Status: AC | PRN
  Administered 2015-03-27: 25 mL via ORAL
  Filled 2015-03-27: qty 25

## 2015-03-27 MED ORDER — FENTANYL CITRATE (PF) 100 MCG/2ML IJ SOLN: 50.0000 ug | Freq: Once | INTRAMUSCULAR | Status: DC

## 2015-03-27 MED ORDER — IOHEXOL 300 MG/ML  SOLN
100.0000 mL | Freq: Once | INTRAMUSCULAR | Status: AC | PRN
  Administered 2015-03-27: 100 mL via INTRAVENOUS
  Filled 2015-03-27: qty 100

## 2015-03-27 MED ORDER — SODIUM CHLORIDE 0.9 % IV SOLN
Freq: Once | INTRAVENOUS | Status: AC
  Administered 2015-03-27: 19:00:00 via INTRAVENOUS

## 2015-03-27 MED ORDER — FENTANYL CITRATE (PF) 100 MCG/2ML IJ SOLN
INTRAMUSCULAR | Status: AC
  Filled 2015-03-27: qty 2

## 2015-03-27 MED ORDER — PROMETHAZINE HCL 25 MG/ML IJ SOLN
25.0000 mg | Freq: Once | INTRAMUSCULAR | Status: AC
  Administered 2015-03-27: 25 mg via INTRAVENOUS

## 2015-03-27 MED ORDER — PEG 3350-KCL-NA BICARB-NACL 420 G PO SOLR: 4000.0000 mL | Freq: Once | ORAL | Status: DC

## 2015-03-27 NOTE — ED Provider Notes (Signed)
[facility] Emergency Department Provider Note     Time seen: ----------------------------------------- 6:43 PM on 03/27/2015 -----------------------------------------    I have reviewed the triage vital signs and the nursing notes.   HISTORY  Chief Complaint Abdominal Pain    HPI Amber Christensen is a 50 y.o. female who presents ER for left-sided abdominal pain and distention. Patient states pain started on Tuesday she did not have a bowel movement Tuesday or Wednesday. Patient states she had a bowel movement this morning, Dr. Gustavo Lah wanted her to come here to be evaluated for a bowel dissection. Patient has had numerous abdominal surgeries, nothing makes her symptoms better or worse.   Past Medical History  Diagnosis Date  . Diabetes mellitus without complication ([place])   . Hypertension   . Liver disease   . GERD (gastroesophageal reflux disease)     There are no active problems to display for this patient.   Past Surgical History  Procedure Laterality Date  . Pancreas surgery    . Dilatation & curettage/hysteroscopy with myosure    . Cholecystectomy    . Appendectomy    . Abdominal hysterectomy    . Bowel resection      Allergies Demerol; Dilaudid; Morphine and related; Darvon; Flexeril; Levaquin; Soma; and Zofran  Social History Social History  Substance Use Topics  . Smoking status: Never Smoker   . Smokeless tobacco: None  . Alcohol Use: No    Review of Systems Constitutional: Negative for fever. Eyes: Negative for visual changes. ENT: Negative for sore throat. Cardiovascular: Negative for chest pain. Respiratory: Negative for shortness of breath. Gastrointestinal: Positive for abdominal pain and nausea Genitourinary: Negative for dysuria. Musculoskeletal: Negative for back pain. Skin: Negative for rash. Neurological: Negative for headaches, focal weakness or numbness.  10-point ROS otherwise  negative.  ____________________________________________   PHYSICAL EXAM:  VITAL SIGNS: ED Triage Vitals  Enc Vitals Group     BP 03/27/15 1746 154/101 mmHg     Pulse Rate 03/27/15 1746 97     Resp 03/27/15 1746 20     Temp 03/27/15 1746 99 F (37.2 C)     Temp Source 03/27/15 1746 Oral     SpO2 03/27/15 1746 97 %     Weight 03/27/15 1746 137 lb (62.143 kg)     Height --      Head Cir --      Peak Flow --      Pain Score 03/27/15 1747 7     Pain Loc --      Pain Edu? --      Excl. in [place]? --     Constitutional: Alert and oriented. Well appearing and in no distress. Eyes: Conjunctivae are normal. PERRL. Normal extraocular movements. ENT   Head: Normocephalic and atraumatic.   Nose: No congestion/rhinnorhea.   Mouth/Throat: Mucous membranes are moist.   Neck: No stridor. Cardiovascular: Normal rate, regular rhythm. Normal and symmetric distal pulses are present in all extremities. No murmurs, rubs, or gallops. Respiratory: Normal respiratory effort without tachypnea nor retractions. Breath sounds are clear and equal bilaterally. No wheezes/rales/rhonchi. Gastrointestinal: Left-sided abdominal tenderness, no rebound or guarding. Normal bowel sounds. Musculoskeletal: Nontender with normal range of motion in all extremities. No joint effusions.  No lower extremity tenderness nor edema. Neurologic:  Normal speech and language. No gross focal neurologic deficits are appreciated. Speech is normal. No gait instability. Skin:  Skin is warm, dry and intact. No rash noted. Psychiatric: Mood and affect are normal. Speech  and behavior are normal. Patient exhibits appropriate insight and judgment. ____________________________________________  ED COURSE:  Pertinent labs & imaging results that were available during my care of the patient were reviewed by me and considered in my medical decision making (see chart for details). She was acute on chronic abdominal pain. I will  obtain CT imaging and basic labs. ____________________________________________    LABS (pertinent positives/negatives)  Labs Reviewed  COMPREHENSIVE METABOLIC PANEL - Abnormal; Notable for the following:    Glucose, Bld 119 (*)    Creatinine, Ser 1.05 (*)    Total Bilirubin 4.4 (*)    All other components within normal limits  LIPASE, BLOOD  CBC  GLUCOSE, CAPILLARY  POC URINE PREG, ED    RADIOLOGY Images were viewed by me  CT the abdomen and pelvis with contrast IMPRESSION: 1. Large amount of retained stool diffusely throughout the colon, suggesting constipation. 2. No other acute intra-abdominal or pelvic process. 3. Stable postsurgical changes status post partial pancreatectomy, cholecystectomy, ileal colonic anastomosis with appendectomy, and hysterectomy. 4. Hepatic steatosis. 5. Probable chronic splenic vein thrombosis with splenic varices, stable from previous. ____________________________________________  FINAL ASSESSMENT AND PLAN  Abdominal pain, constipation  Plan: Patient with labs and imaging as dictated above. Patient will be placed on laxatives, CT is reassuring as are labs. She has a chronically elevated bilirubin. She is stable for outpatient follow-up with her doctor.   Earleen Newport, MD   Earleen Newport, MD 03/27/15 2120

## 2015-03-27 NOTE — ED Notes (Signed)
Pt to ed with c/o abd distention and pain,  Pt states pain started on Tuesday and she did not have a bm on Tuesday or wed. Pt states had bm this am.  Sent by dr Donnella Sham

## 2015-03-27 NOTE — ED Notes (Signed)
In CT

## 2015-03-27 NOTE — Discharge Instructions (Signed)

## 2015-04-16 ENCOUNTER — Other Ambulatory Visit: Payer: Self-pay | Admitting: Internal Medicine

## 2015-04-16 DIAGNOSIS — Z1239 Encounter for other screening for malignant neoplasm of breast: Secondary | ICD-10-CM

## 2015-08-28 ENCOUNTER — Emergency Department
Admission: EM | Admit: 2015-08-28 | Discharge: 2015-08-28 | Disposition: A | Discharge status: Home / Self Care | Payer: Medicare Other | Attending: Emergency Medicine | Admitting: Emergency Medicine

## 2015-08-28 ENCOUNTER — Encounter: Payer: Self-pay | Admitting: Emergency Medicine

## 2015-08-28 DIAGNOSIS — I10 Essential (primary) hypertension: Secondary | ICD-10-CM | POA: Diagnosis not present

## 2015-08-28 DIAGNOSIS — R55 Syncope and collapse: Secondary | ICD-10-CM | POA: Diagnosis present

## 2015-08-28 DIAGNOSIS — Z794 Long term (current) use of insulin: Secondary | ICD-10-CM | POA: Diagnosis not present

## 2015-08-28 DIAGNOSIS — E876 Hypokalemia: Secondary | ICD-10-CM | POA: Insufficient documentation

## 2015-08-28 DIAGNOSIS — R17 Unspecified jaundice: Secondary | ICD-10-CM | POA: Diagnosis not present

## 2015-08-28 DIAGNOSIS — Z79899 Other long term (current) drug therapy: Secondary | ICD-10-CM | POA: Insufficient documentation

## 2015-08-28 DIAGNOSIS — Z7982 Long term (current) use of aspirin: Secondary | ICD-10-CM | POA: Diagnosis not present

## 2015-08-28 DIAGNOSIS — E162 Hypoglycemia, unspecified: Secondary | ICD-10-CM

## 2015-08-28 DIAGNOSIS — E11649 Type 2 diabetes mellitus with hypoglycemia without coma: Secondary | ICD-10-CM | POA: Diagnosis not present

## 2015-08-28 LAB — URINALYSIS COMPLETE WITH MICROSCOPIC (ARMC ONLY)
Bilirubin Urine: NEGATIVE
Glucose, UA: >500 mg/dL — AB
Hgb urine dipstick: NEGATIVE
Ketones, ur: NEGATIVE mg/dL
Leukocytes, UA: NEGATIVE
Nitrite: NEGATIVE
Protein, ur: NEGATIVE mg/dL
RBC / HPF: NONE SEEN RBC/hpf (ref 0–5)
Specific Gravity, Urine: 1.002 — ABNORMAL LOW (ref 1.005–1.030)
pH: 6 (ref 5.0–8.0)

## 2015-08-28 LAB — CBC WITH DIFFERENTIAL/PLATELET
Basophils Absolute: 0 10*3/uL (ref 0–0.1)
Basophils Relative: 0 %
Eosinophils Absolute: 0.1 10*3/uL (ref 0–0.7)
Eosinophils Relative: 1 %
HCT: 39.1 % (ref 35.0–47.0)
Hemoglobin: 14.2 g/dL (ref 12.0–16.0)
Lymphocytes Relative: 39 %
Lymphs Abs: 2.6 10*3/uL (ref 1.0–3.6)
MCH: 31.2 pg (ref 26.0–34.0)
MCHC: 36.2 g/dL — ABNORMAL HIGH (ref 32.0–36.0)
MCV: 86.1 fL (ref 80.0–100.0)
Monocytes Absolute: 0.3 10*3/uL (ref 0.2–0.9)
Monocytes Relative: 5 %
Neutro Abs: 3.7 10*3/uL (ref 1.4–6.5)
Neutrophils Relative %: 55 %
Platelets: 167 10*3/uL (ref 150–440)
RBC: 4.54 MIL/uL (ref 3.80–5.20)
RDW: 13.5 % (ref 11.5–14.5)
WBC: 6.8 10*3/uL (ref 3.6–11.0)

## 2015-08-28 LAB — COMPREHENSIVE METABOLIC PANEL
ALT: 37 U/L (ref 14–54)
AST: 29 U/L (ref 15–41)
Albumin: 4.4 g/dL (ref 3.5–5.0)
Alkaline Phosphatase: 130 U/L — ABNORMAL HIGH (ref 38–126)
Anion gap: 8 (ref 5–15)
BUN: 13 mg/dL (ref 6–20)
CO2: 29 mmol/L (ref 22–32)
Calcium: 8.8 mg/dL — ABNORMAL LOW (ref 8.9–10.3)
Chloride: 102 mmol/L (ref 101–111)
Creatinine, Ser: 0.59 mg/dL (ref 0.44–1.00)
GFR calc Af Amer: >60 mL/min (ref >60)
GFR calc non Af Amer: >60 mL/min (ref >60)
Glucose, Bld: 91 mg/dL (ref 65–99)
Potassium: 2.8 mmol/L — ABNORMAL LOW (ref 3.5–5.1)
Sodium: 139 mmol/L (ref 135–145)
Total Bilirubin: 4.8 mg/dL — ABNORMAL HIGH (ref 0.3–1.2)
Total Protein: 7.2 g/dL (ref 6.5–8.1)

## 2015-08-28 LAB — PROTIME-INR
INR: 1.1
Prothrombin Time: 14.4 seconds (ref 11.4–15.0)

## 2015-08-28 LAB — GLUCOSE, CAPILLARY: Glucose-Capillary: 54 mg/dL — ABNORMAL LOW (ref 65–99)

## 2015-08-28 LAB — TROPONIN I: Troponin I: <0.03 ng/mL (ref <0.03)

## 2015-08-28 MED ORDER — POTASSIUM CHLORIDE CRYS ER 20 MEQ PO TBCR
60.0000 meq | EXTENDED_RELEASE_TABLET | Freq: Once | ORAL | Status: AC
  Administered 2015-08-28: 60 meq via ORAL
  Filled 2015-08-28: qty 3

## 2015-08-28 MED ORDER — SODIUM CHLORIDE 0.9 % IV SOLN
Freq: Once | INTRAVENOUS | Status: AC
  Administered 2015-08-28: 09:00:00 via INTRAVENOUS

## 2015-08-28 MED ORDER — DEXTROSE 50 % IV SOLN
50.0000 mL | Freq: Once | INTRAVENOUS | Status: AC
  Administered 2015-08-28: 50 mL via INTRAVENOUS

## 2015-08-28 MED ORDER — DEXTROSE 50 % IV SOLN
INTRAVENOUS | Status: AC
  Filled 2015-08-28: qty 50

## 2015-08-28 NOTE — ED Notes (Signed)
Pt arrived via EMS from work. Pt reports near syncope, spotty vision, feeling faint, shakes, headache and not feeling right. Pt reports CBG at home 296, EMS CBG 89. EMS reports 120/80.

## 2015-08-28 NOTE — ED Provider Notes (Signed)
[facility] Emergency Department Provider Note        Time seen: ----------------------------------------- 8:42 AM on 08/28/2015 -----------------------------------------    I have reviewed the triage vital signs and the nursing notes.   HISTORY  Chief Complaint Near Syncope    HPI Amber Christensen is a 50 y.o. female who presents to ER from work. Patient reports near syncope, changes in her vision, feeling faint. She's also had headaches in, she "does not feel right". Blood sugar at home was 296. Patient reports she's been out of some of her medicines. Her GI doctor has recently moved away. She describes increasing jaundice.   Past Medical History  Diagnosis Date  . Diabetes mellitus without complication ([place])   . Hypertension   . Liver disease   . GERD (gastroesophageal reflux disease)     There are no active problems to display for this patient.   Past Surgical History  Procedure Laterality Date  . Pancreas surgery    . Dilatation & curettage/hysteroscopy with myosure    . Cholecystectomy    . Appendectomy    . Abdominal hysterectomy    . Bowel resection      Allergies Demerol; Dilaudid; Morphine and related; Darvon; Flexeril; Levaquin; Soma; and Zofran  Social History Social History  Substance Use Topics  . Smoking status: Never Smoker   . Smokeless tobacco: None  . Alcohol Use: No    Review of Systems Constitutional: Negative for fever. Eyes: Positive for vision changes ENT: Negative for sore throat. Cardiovascular: Negative for chest pain. Respiratory: Negative for shortness of breath. Gastrointestinal: Negative for abdominal pain, vomiting and diarrhea. Genitourinary: Negative for dysuria. Musculoskeletal: Negative for back pain. Skin: Negative for rash. Neurological: Positive for headache, weakness  10-point ROS otherwise negative.  ____________________________________________   PHYSICAL EXAM:  VITAL SIGNS: ED  Triage Vitals  Enc Vitals Group     BP 08/28/15 0837 143/105 mmHg     Pulse Rate 08/28/15 0837 76     Resp 08/28/15 0837 18     Temp 08/28/15 0837 97.5 F (36.4 C)     Temp Source 08/28/15 0837 Oral     SpO2 08/28/15 0837 97 %     Weight 08/28/15 0837 134 lb (60.782 kg)     Height 08/28/15 0837 5' 4"  (1.626 m)     Head Cir --      Peak Flow --      Pain Score 08/28/15 0839 10     Pain Loc --      Pain Edu? --      Excl. in [place]? --     Constitutional: Alert and oriented. Anxious, no distress Eyes: Scleral icterus, normal extraocular movements ENT   Head: Normocephalic and atraumatic.   Nose: No congestion/rhinnorhea.   Mouth/Throat: Mucous membranes are moist.   Neck: No stridor. Cardiovascular: Normal rate, regular rhythm. No murmurs, rubs, or gallops. Respiratory: Normal respiratory effort without tachypnea nor retractions. Breath sounds are clear and equal bilaterally. No wheezes/rales/rhonchi. Gastrointestinal: Soft and nontender. Normal bowel sounds Musculoskeletal: Nontender with normal range of motion in all extremities. No lower extremity tenderness nor edema. Neurologic:  Normal speech and language. No gross focal neurologic deficits are appreciated.  Skin:  Skin is warm, dry and intact. No rash noted. Psychiatric: Mood and affect are normal. Speech and behavior are normal.  ____________________________________________  EKG: Interpreted by me. Sinus rhythm with a rate of 73 bpm, normal PR interval, normal QRS, normal QT interval. Normal axis. No evidence  of acute infarction.  ____________________________________________  ED COURSE:  Pertinent labs & imaging results that were available during my care of the patient were reviewed by me and considered in my medical decision making (see chart for details). Patient presents to ER with near syncope, increasing jaundice. We'll check basic labs and reevaluate. ____________________________________________     LABS (pertinent positives/negatives)  Labs Reviewed  CBC WITH DIFFERENTIAL/PLATELET - Abnormal; Notable for the following:    MCHC 36.2 (*)    All other components within normal limits  COMPREHENSIVE METABOLIC PANEL - Abnormal; Notable for the following:    Potassium 2.8 (*)    Calcium 8.8 (*)    Alkaline Phosphatase 130 (*)    Total Bilirubin 4.8 (*)    All other components within normal limits  URINALYSIS COMPLETEWITH MICROSCOPIC ([facility] ONLY) - Abnormal; Notable for the following:    Color, Urine STRAW (*)    APPearance CLEAR (*)    Glucose, UA >500 (*)    Specific Gravity, Urine 1.002 (*)    Bacteria, UA RARE (*)    Squamous Epithelial / LPF 0-5 (*)    All other components within normal limits  GLUCOSE, CAPILLARY - Abnormal; Notable for the following:    Glucose-Capillary 54 (*)    All other components within normal limits  TROPONIN I  PROTIME-INR  ____________________________________________  FINAL ASSESSMENT AND PLAN  Near syncope, jaundice  Plan: Patient with labs and imaging as dictated above. No clear specific etiology for her symptoms. She had mild hypokalemia and mild hypoglycemia. She currently has eaten, feels better after IV fluids. I will advise close outpatient follow-up with her doctor.   Earleen Newport, MD   Note: This dictation was prepared with Dragon dictation. Any transcriptional errors that result from this process are unintentional   Earleen Newport, MD 08/28/15 1219

## 2015-08-28 NOTE — Discharge Instructions (Signed)

## 2015-08-28 NOTE — ED Notes (Signed)
Pt assisted to ambulate, states "feeling much better but still a little shaky."

## 2015-09-28 ENCOUNTER — Emergency Department: Payer: Medicare Other

## 2015-09-28 ENCOUNTER — Encounter: Payer: Self-pay | Admitting: Emergency Medicine

## 2015-09-28 ENCOUNTER — Observation Stay
Admission: EM | Admit: 2015-09-28 | Discharge: 2015-09-29 | Disposition: A | Discharge status: Home / Self Care | Payer: Medicare Other | Attending: Specialist | Admitting: Specialist

## 2015-09-28 DIAGNOSIS — K861 Other chronic pancreatitis: Secondary | ICD-10-CM | POA: Insufficient documentation

## 2015-09-28 DIAGNOSIS — E876 Hypokalemia: Secondary | ICD-10-CM | POA: Insufficient documentation

## 2015-09-28 DIAGNOSIS — Z79899 Other long term (current) drug therapy: Secondary | ICD-10-CM | POA: Insufficient documentation

## 2015-09-28 DIAGNOSIS — Z794 Long term (current) use of insulin: Secondary | ICD-10-CM | POA: Insufficient documentation

## 2015-09-28 DIAGNOSIS — Z90710 Acquired absence of both cervix and uterus: Secondary | ICD-10-CM | POA: Diagnosis not present

## 2015-09-28 DIAGNOSIS — Z9049 Acquired absence of other specified parts of digestive tract: Secondary | ICD-10-CM | POA: Diagnosis not present

## 2015-09-28 DIAGNOSIS — K219 Gastro-esophageal reflux disease without esophagitis: Secondary | ICD-10-CM | POA: Diagnosis not present

## 2015-09-28 DIAGNOSIS — R17 Unspecified jaundice: Secondary | ICD-10-CM | POA: Diagnosis not present

## 2015-09-28 DIAGNOSIS — E0865 Diabetes mellitus due to underlying condition with hyperglycemia: Secondary | ICD-10-CM | POA: Diagnosis not present

## 2015-09-28 DIAGNOSIS — Z9889 Other specified postprocedural states: Secondary | ICD-10-CM | POA: Insufficient documentation

## 2015-09-28 DIAGNOSIS — I10 Essential (primary) hypertension: Secondary | ICD-10-CM | POA: Insufficient documentation

## 2015-09-28 DIAGNOSIS — R079 Chest pain, unspecified: Secondary | ICD-10-CM

## 2015-09-28 DIAGNOSIS — K8681 Exocrine pancreatic insufficiency: Secondary | ICD-10-CM | POA: Diagnosis not present

## 2015-09-28 DIAGNOSIS — I200 Unstable angina: Principal | ICD-10-CM | POA: Diagnosis present

## 2015-09-28 DIAGNOSIS — Z888 Allergy status to other drugs, medicaments and biological substances status: Secondary | ICD-10-CM | POA: Diagnosis not present

## 2015-09-28 DIAGNOSIS — Z885 Allergy status to narcotic agent status: Secondary | ICD-10-CM | POA: Insufficient documentation

## 2015-09-28 HISTORY — DX: Acute pancreatitis without necrosis or infection, unspecified: K85.90

## 2015-09-28 LAB — BASIC METABOLIC PANEL
Anion gap: 8 (ref 5–15)
BUN: 10 mg/dL (ref 6–20)
CO2: 25 mmol/L (ref 22–32)
Calcium: 7.9 mg/dL — ABNORMAL LOW (ref 8.9–10.3)
Chloride: 102 mmol/L (ref 101–111)
Creatinine, Ser: 0.57 mg/dL (ref 0.44–1.00)
GFR calc Af Amer: >60 mL/min (ref >60)
GFR calc non Af Amer: >60 mL/min (ref >60)
Glucose, Bld: 331 mg/dL — ABNORMAL HIGH (ref 65–99)
Potassium: 2.8 mmol/L — ABNORMAL LOW (ref 3.5–5.1)
Sodium: 135 mmol/L (ref 135–145)

## 2015-09-28 LAB — GLUCOSE, CAPILLARY
Glucose-Capillary: 246 mg/dL — ABNORMAL HIGH (ref 65–99)
Glucose-Capillary: 287 mg/dL — ABNORMAL HIGH (ref 65–99)
Glucose-Capillary: 362 mg/dL — ABNORMAL HIGH (ref 65–99)

## 2015-09-28 LAB — LIPID PANEL
Cholesterol: 100 mg/dL (ref 0–200)
HDL: 19 mg/dL — ABNORMAL LOW (ref >40)
LDL Cholesterol: 68 mg/dL (ref 0–99)
Total CHOL/HDL Ratio: 5.3 RATIO
Triglycerides: 64 mg/dL (ref <150)
VLDL: 13 mg/dL (ref 0–40)

## 2015-09-28 LAB — CREATININE, SERUM
Creatinine, Ser: 0.56 mg/dL (ref 0.44–1.00)
GFR calc Af Amer: >60 mL/min (ref >60)
GFR calc non Af Amer: >60 mL/min (ref >60)

## 2015-09-28 LAB — CBC
HCT: 37.3 % (ref 35.0–47.0)
HCT: 39.9 % (ref 35.0–47.0)
Hemoglobin: 13.4 g/dL (ref 12.0–16.0)
Hemoglobin: 14.1 g/dL (ref 12.0–16.0)
MCH: 31.5 pg (ref 26.0–34.0)
MCH: 31 pg (ref 26.0–34.0)
MCHC: 36 g/dL (ref 32.0–36.0)
MCHC: 35.4 g/dL (ref 32.0–36.0)
MCV: 87.5 fL (ref 80.0–100.0)
MCV: 87.5 fL (ref 80.0–100.0)
Platelets: 130 K/uL — ABNORMAL LOW (ref 150–440)
Platelets: 145 10*3/uL — ABNORMAL LOW (ref 150–440)
RBC: 4.26 MIL/uL (ref 3.80–5.20)
RBC: 4.56 MIL/uL (ref 3.80–5.20)
RDW: 13.6 % (ref 11.5–14.5)
RDW: 13.9 % (ref 11.5–14.5)
WBC: 5.6 K/uL (ref 3.6–11.0)
WBC: 7.4 10*3/uL (ref 3.6–11.0)

## 2015-09-28 LAB — HEPATIC FUNCTION PANEL
ALT: 34 U/L (ref 14–54)
AST: 32 U/L (ref 15–41)
Albumin: 3.7 g/dL (ref 3.5–5.0)
Alkaline Phosphatase: 109 U/L (ref 38–126)
Bilirubin, Direct: 0.2 mg/dL (ref 0.1–0.5)
Indirect Bilirubin: 3.9 mg/dL — ABNORMAL HIGH (ref 0.3–0.9)
Total Bilirubin: 4.1 mg/dL — ABNORMAL HIGH (ref 0.3–1.2)
Total Protein: 6.3 g/dL — ABNORMAL LOW (ref 6.5–8.1)

## 2015-09-28 LAB — TROPONIN I
Troponin I: <0.03 ng/mL
Troponin I: <0.03 ng/mL (ref <0.03)
Troponin I: <0.03 ng/mL (ref <0.03)

## 2015-09-28 LAB — LIPASE, BLOOD: Lipase: 13 U/L (ref 11–51)

## 2015-09-28 LAB — HEMOGLOBIN A1C: Hgb A1c MFr Bld: 10.6 % — ABNORMAL HIGH (ref 4.0–6.0)

## 2015-09-28 LAB — TSH: TSH: 1.034 u[IU]/mL (ref 0.350–4.500)

## 2015-09-28 LAB — MAGNESIUM: Magnesium: 1.6 mg/dL — ABNORMAL LOW (ref 1.7–2.4)

## 2015-09-28 LAB — PHOSPHORUS: Phosphorus: 3.9 mg/dL (ref 2.5–4.6)

## 2015-09-28 MED ORDER — POTASSIUM CHLORIDE 10 MEQ/100ML IV SOLN
10.0000 meq | Freq: Once | INTRAVENOUS | Status: AC
  Administered 2015-09-28: 10 meq via INTRAVENOUS
  Filled 2015-09-28: qty 100

## 2015-09-28 MED ORDER — POTASSIUM CHLORIDE 10 MEQ/100ML IV SOLN
10.0000 meq | INTRAVENOUS | Status: DC
  Filled 2015-09-28 (×4): qty 100

## 2015-09-28 MED ORDER — POTASSIUM CHLORIDE 10 MEQ/100ML IV SOLN
10.0000 meq | INTRAVENOUS | Status: DC
  Administered 2015-09-28: 10 meq via INTRAVENOUS
  Filled 2015-09-28 (×4): qty 100

## 2015-09-28 MED ORDER — POTASSIUM CHLORIDE CRYS ER 20 MEQ PO TBCR
40.0000 meq | EXTENDED_RELEASE_TABLET | Freq: Two times a day (BID) | ORAL | Status: DC
  Administered 2015-09-28 – 2015-09-29 (×2): 40 meq via ORAL
  Filled 2015-09-28 (×2): qty 2

## 2015-09-28 MED ORDER — HYDRALAZINE HCL 20 MG/ML IJ SOLN: 10.0000 mg | Freq: Four times a day (QID) | INTRAMUSCULAR | Status: DC | PRN

## 2015-09-28 MED ORDER — LISINOPRIL 20 MG PO TABS
20.0000 mg | ORAL_TABLET | Freq: Every day | ORAL | Status: DC
  Administered 2015-09-29: 20 mg via ORAL
  Filled 2015-09-28: qty 1

## 2015-09-28 MED ORDER — CARVEDILOL 3.125 MG PO TABS
3.1250 mg | ORAL_TABLET | Freq: Two times a day (BID) | ORAL | Status: DC
  Administered 2015-09-28: 3.125 mg via ORAL
  Filled 2015-09-28 (×2): qty 1

## 2015-09-28 MED ORDER — ASPIRIN 81 MG PO CHEW
324.0000 mg | CHEWABLE_TABLET | Freq: Once | ORAL | Status: AC
  Administered 2015-09-28: 324 mg via ORAL
  Filled 2015-09-28: qty 4

## 2015-09-28 MED ORDER — PANCRELIPASE (LIP-PROT-AMYL) 12000-38000 UNITS PO CPEP
36000.0000 [IU] | ORAL_CAPSULE | Freq: Three times a day (TID) | ORAL | Status: DC
  Administered 2015-09-28 – 2015-09-29 (×3): 36000 [IU] via ORAL
  Filled 2015-09-28 (×5): qty 3

## 2015-09-28 MED ORDER — CLONIDINE HCL 0.1 MG PO TABS
0.1000 mg | ORAL_TABLET | Freq: Two times a day (BID) | ORAL | Status: DC
  Administered 2015-09-28 – 2015-09-29 (×2): 0.1 mg via ORAL
  Filled 2015-09-28 (×2): qty 1

## 2015-09-28 MED ORDER — SODIUM CHLORIDE 0.9% FLUSH
3.0000 mL | Freq: Two times a day (BID) | INTRAVENOUS | Status: DC
  Administered 2015-09-28 – 2015-09-29 (×2): 3 mL via INTRAVENOUS

## 2015-09-28 MED ORDER — VITAMIN E 180 MG (400 UNIT) PO CAPS
400.0000 [IU] | ORAL_CAPSULE | Freq: Every day | ORAL | Status: DC
Start: 2015-09-28 — End: 2015-09-29
  Administered 2015-09-28 – 2015-09-29 (×2): 400 [IU] via ORAL
  Filled 2015-09-28 (×2): qty 1

## 2015-09-28 MED ORDER — PANTOPRAZOLE SODIUM 40 MG PO TBEC
40.0000 mg | DELAYED_RELEASE_TABLET | Freq: Two times a day (BID) | ORAL | Status: DC
  Administered 2015-09-28 – 2015-09-29 (×3): 40 mg via ORAL
  Filled 2015-09-28 (×3): qty 1

## 2015-09-28 MED ORDER — SODIUM CHLORIDE 0.9 % IV BOLUS (SEPSIS)
500.0000 mL | Freq: Once | INTRAVENOUS | Status: AC
  Administered 2015-09-28: 500 mL via INTRAVENOUS

## 2015-09-28 MED ORDER — NITROGLYCERIN 0.4 MG SL SUBL
0.4000 mg | SUBLINGUAL_TABLET | SUBLINGUAL | Status: DC | PRN
  Administered 2015-09-28 (×3): 0.4 mg via SUBLINGUAL
  Filled 2015-09-28: qty 1

## 2015-09-28 MED ORDER — INSULIN ASPART 100 UNIT/ML ~~LOC~~ SOLN
0.0000 [IU] | Freq: Every day | SUBCUTANEOUS | Status: DC
  Administered 2015-09-28: 5 [IU] via SUBCUTANEOUS
  Filled 2015-09-28: qty 5

## 2015-09-28 MED ORDER — GI COCKTAIL ~~LOC~~
30.0000 mL | Freq: Once | ORAL | Status: AC
  Administered 2015-09-28: 30 mL via ORAL
  Filled 2015-09-28: qty 30

## 2015-09-28 MED ORDER — LISINOPRIL 10 MG PO TABS
20.0000 mg | ORAL_TABLET | Freq: Once | ORAL | Status: AC
  Administered 2015-09-28: 20 mg via ORAL
  Filled 2015-09-28: qty 2

## 2015-09-28 MED ORDER — INSULIN GLARGINE 100 UNIT/ML ~~LOC~~ SOLN
10.0000 [IU] | Freq: Every day | SUBCUTANEOUS | Status: DC
  Administered 2015-09-28: 10 [IU] via SUBCUTANEOUS
  Filled 2015-09-28 (×2): qty 0.1

## 2015-09-28 MED ORDER — ENOXAPARIN SODIUM 40 MG/0.4ML ~~LOC~~ SOLN
40.0000 mg | SUBCUTANEOUS | Status: DC
  Administered 2015-09-28: 40 mg via SUBCUTANEOUS
  Filled 2015-09-28: qty 0.4

## 2015-09-28 MED ORDER — ASPIRIN 81 MG PO CHEW
81.0000 mg | CHEWABLE_TABLET | Freq: Every day | ORAL | Status: DC
  Administered 2015-09-29: 81 mg via ORAL
  Filled 2015-09-28 (×2): qty 1

## 2015-09-28 MED ORDER — INSULIN ASPART 100 UNIT/ML ~~LOC~~ SOLN
0.0000 [IU] | Freq: Three times a day (TID) | SUBCUTANEOUS | Status: DC
  Administered 2015-09-28: 5 [IU] via SUBCUTANEOUS
  Administered 2015-09-29: 15 [IU] via SUBCUTANEOUS
  Filled 2015-09-28: qty 1
  Filled 2015-09-28: qty 15
  Filled 2015-09-28: qty 5

## 2015-09-28 MED ORDER — AMLODIPINE BESYLATE 10 MG PO TABS
10.0000 mg | ORAL_TABLET | Freq: Every day | ORAL | Status: DC
  Administered 2015-09-28 – 2015-09-29 (×2): 10 mg via ORAL
  Filled 2015-09-28 (×2): qty 1

## 2015-09-28 NOTE — ED Provider Notes (Signed)
[facility] Emergency Department Provider Note   ____________________________________________   First MD Initiated Contact with Patient 09/28/15 [phone]     (approximate)  I have reviewed the triage vital signs and the nursing notes.   HISTORY  Chief Complaint Chest Pain and Shortness of Breath    HPI Shaneisha Burkel is a 50 y.o. female who comes into the hospital today not feeling well. She reports that yesterday she started with nausea and abdominal pain. She reports it does not, in for her as she does have a history of pancreatitis and liver issues. She reports that this morning though she was feeling short of breath and her sugar was high. The patient reports that she nor do and decided to get ready for work. She reports that as they were on their way to work she developed some very sharp chest pain in the middle of her chest. She reports that it is now a heaviness that goes into her shoulder and her back. She feels nauseous and sweaty. The patient reports that her pain is never been this bad before. The patient rates her pain a 10 out of 10 in intensity. The patient did not take anything for pain. She reports that she did have some indigestion yesterday. The patient has been off of her Creon for 3 months. She reports that she took the last of her medications last night. The patient reports that she is here for evaluation as she does not know what is wrong with her.   Past Medical History:  Diagnosis Date  . Diabetes mellitus without complication ([place])   . GERD (gastroesophageal reflux disease)   . Hypertension   . Liver disease   . Pancreatitis     There are no active problems to display for this patient.   Past Surgical History:  Procedure Laterality Date  . ABDOMINAL HYSTERECTOMY    . APPENDECTOMY    . BOWEL RESECTION    . CHOLECYSTECTOMY    . DILATATION & CURETTAGE/HYSTEROSCOPY WITH MYOSURE    . PANCREAS SURGERY      Prior to Admission  medications   Medication Sig Start Date End Date Taking? Authorizing Provider  amLODipine (NORVASC) 5 MG tablet Take 5 mg by mouth daily.    Historical Provider, MD  aspirin EC 81 MG tablet Take 81 mg by mouth daily.    Historical Provider, MD  cloNIDine (CATAPRES) 0.1 MG tablet Take 0.1 mg by mouth 2 (two) times daily.    Historical Provider, MD  insulin glargine (LANTUS) 100 UNIT/ML injection Inject 10 Units into the skin at bedtime.     Historical Provider, MD  insulin lispro (HUMALOG) 100 UNIT/ML injection Inject into the skin 3 (three) times daily with meals as needed for high blood sugar. Pt uses based off the amount of carbs she eats at each meal.    Historical Provider, MD  lisinopril (PRINIVIL,ZESTRIL) 20 MG tablet Take 20 mg by mouth daily.    Historical Provider, MD  pantoprazole (PROTONIX) 40 MG tablet Take 40 mg by mouth 2 (two) times daily.    Historical Provider, MD  vitamin E 400 UNIT capsule Take 400 Units by mouth daily.    Historical Provider, MD    Allergies Demerol [meperidine]; Dilaudid [hydromorphone hcl]; Morphine and related; Darvon [propoxyphene]; Flexeril [cyclobenzaprine]; Levaquin [levofloxacin in d5w]; Soma [carisoprodol]; and Zofran [ondansetron hcl]  History reviewed. No pertinent family history.  Social History Social History  Substance Use Topics  . Smoking status: Never Smoker  .  Smokeless tobacco: Former Systems developer  . Alcohol use No    Review of Systems Constitutional: No fever/chills Eyes: No visual changes. ENT: No sore throat. Cardiovascular:  chest pain. Respiratory:  shortness of breath. Gastrointestinal: Nausea, No abdominal pain.  no vomiting.  No diarrhea.  No constipation. Genitourinary: Negative for dysuria. Musculoskeletal: back pain. Skin: Negative for rash. Neurological: Negative for headaches, focal weakness or numbness.  10-point ROS otherwise negative.  ____________________________________________   PHYSICAL EXAM:  VITAL  SIGNS: ED Triage Vitals  Enc Vitals Group     BP 09/28/15 0606 (!) 170/112     Pulse Rate 09/28/15 0606 99     Resp 09/28/15 0606 18     Temp 09/28/15 0606 97.6 F (36.4 C)     Temp Source 09/28/15 0606 Oral     SpO2 09/28/15 0606 99 %     Weight 09/28/15 0608 134 lb (60.8 kg)     Height 09/28/15 0608 5' 4"  (1.626 m)     Head Circumference --      Peak Flow --      Pain Score 09/28/15 0608 10     Pain Loc --      Pain Edu? --      Excl. in [place]? --     Constitutional: Alert and oriented. Well appearing and in Moderate distress. Eyes: Conjunctivae are normal. PERRL. EOMI. Head: Atraumatic. Nose: No congestion/rhinnorhea. Mouth/Throat: Mucous membranes are moist.  Oropharynx non-erythematous. Cardiovascular: Normal rate, regular rhythm. Grossly normal heart sounds.  Good peripheral circulation. Respiratory: Normal respiratory effort.  No retractions. Lungs CTAB. Gastrointestinal: Soft and nontender. No distention. Positive bowel sounds Musculoskeletal: No lower extremity tenderness nor edema.   Neurologic:  Normal speech and language.  Skin:  Skin is warm, dry and intact.  Psychiatric: Mood and affect are normal.   ____________________________________________   LABS (all labs ordered are listed, but only abnormal results are displayed)  Labs Reviewed  CBC - Abnormal; Notable for the following:       Result Value   Platelets 130 (*)    All other components within normal limits  BASIC METABOLIC PANEL  TROPONIN I  LIPASE, BLOOD  HEPATIC FUNCTION PANEL   ____________________________________________  EKG  ED ECG REPORT I, Loney Hering, the attending physician, personally viewed and interpreted this ECG.   Date: 09/28/2015  EKG Time: 609  Rate: 90  Rhythm: normal sinus rhythm  Axis: normal  Intervals:none  ST&T Change:  none  ____________________________________________  RADIOLOGY  CXR ____________________________________________   PROCEDURES  Procedure(s) performed: None  Procedures  Critical Care performed: No  ____________________________________________   INITIAL IMPRESSION / ASSESSMENT AND PLAN / ED COURSE  Pertinent labs & imaging results that were available during my care of the patient were reviewed by me and considered in my medical decision making (see chart for details).  This is a 50 year old female who comes into the hospital today with some chest pain. She reports that the pain radiates around her back. The patient reports she's never had pain like this before. I will check the patient's blood work as well as an x-ray and EKG. The patient will be reassessed. I will give her some nitroglycerin for her pain and determine if she needs admission for further evaluation or outpatient follow-up.  Clinical Course  Value Comment By Time  DG Chest 2 View No active cardiopulmonary  Loney Hering, MD 08/14 [phone]   The patient's care will be signed out to Dr. Clearnce Hasten who will  follow up the results of the blood work and repeat the troponin as well as reassess the patient.   ____________________________________________   FINAL CLINICAL IMPRESSION(S) / ED DIAGNOSES  Final diagnoses:  Chest pain, unspecified chest pain type      NEW MEDICATIONS STARTED DURING THIS VISIT:  New Prescriptions   No medications on file     Note:  This document was prepared using Dragon voice recognition software and may include unintentional dictation errors.    Loney Hering, MD 09/28/15 [phone]

## 2015-09-28 NOTE — ED Provider Notes (Signed)
Patient signed out from Dr. Dahlia Client. Patient is a 50 year old female with a history of hypertension and diabetes as well as pancreatitis was presenting for epigastric pain as well as lower chest pain. The pain she says is radiating through to her back and is associated with nausea vomiting and shortness of breath. Physical Exam  BP (!) 139/99   Pulse 78   Temp 97.6 F (36.4 C) (Oral)   Resp 11   Ht 5' 4"  (1.626 m)   Wt 134 lb (60.8 kg)   SpO2 97%   BMI 23.00 kg/m  ----------------------------------------- 11:39 AM on 09/28/2015 -----------------------------------------   Physical Exam Patient is resting comfortable without any signs of distress. ED Course  Procedures  MDM Patient with very reassuring workup but says that her pain completely resolved with nitroglycerin and is now returning as a 4 out of 10. She is a strong family history of coronary artery disease with both of her parents having heart attacks in the 64s or early 54s. The patient's last stress test was 2 years ago. She has been here intermittently for chest pain as well as nursing blood so that was several weeks ago. She has had poor follow-up as an outpatient with cardiology. I discussed with the patient following up with cardiology at this time and she says that she would feel safer staying in the hospital for further workup. We discussed that she would likely be admitted as a chest pain under observation status and she is understanding of this plan and willing to comply. Signed out to Dr. Genia Harold of internal medicine.       Orbie Pyo, MD 09/28/15 1140

## 2015-09-28 NOTE — ED Triage Notes (Signed)
Pt presents to ED with c/o not feeling well for the past couple of weeks. Reports having squeezing chest pain that radiates down her left arm and sob upon waking this morning. Blood glucose has been elevated due to not not having any insulin. Pt states she has not been able to get her medications due to confusion with medicaid.

## 2015-09-28 NOTE — ED Notes (Signed)
Pt reports hx necroting pancreatitis, hyperlipidemia, hypertension, diabetes, COPD. Pt c/o left chest pain that radiates to back, shoulder, arm and neck. Pt reports accompanying symptoms of nausea, SOB, diaphoresis, weakness/dizziness/lightheadedness. Pt reports hx of previous cardiac cath, however no blockage was found

## 2015-09-28 NOTE — Care Management Obs Status (Signed)
[place] NOTIFICATION   Patient Details  Name: Levina Boyack MRN: [id number] Date of Birth: 14-Mar-1965   Medicare Observation Status Notification Given:   Yes    Beau Fanny, RN 09/28/2015, 12:36 PM

## 2015-09-28 NOTE — H&P (Addendum)
[facility] [place] NAME: Amber Christensen    MR#:  [id number]  DATE OF BIRTH:  Jun 15, 1965  DATE OF ADMISSION:  09/28/2015  PRIMARY CARE PHYSICIAN: Glendon Axe, MD   REQUESTING/REFERRING PHYSICIAN: Dr. Dineen Kid  CHIEF COMPLAINT:   Chest pain HISTORY OF PRESENT ILLNESS:  Amber Christensen  is a 50 y.o. 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 Patient reports yesterday she had episode of nausea and almost vomiting with dizziness and was not feeling well. She did not have chest pain during that time. This morning she will cup around 4:30 and she had another episode of feeling fatigue and strange. She checked her blood sugar which was 377. She herself 7 units of insulin. Shortly after that she was feeling clammy short of breath and had chest pain. She tried to "shake this off". The symptoms lasted just seconds. She took a shower and was headed to work. In the car she again experienced shortness of breath, diaphoresis and a strange sensation. She also had chest pain during this episode. She presented to the ER with these symptoms. In the emergency room EKG shows no ST elevation however does show LVH. Her first set of troponins are negative. He reports that her symptoms were relieved with nitroglycerin.  PAST MEDICAL HISTORY:   Past Medical History:  Diagnosis Date  . Diabetes mellitus without complication ([place])   . GERD (gastroesophageal reflux disease)   . Hypertension   . Liver disease   . Pancreatitis     PAST SURGICAL HISTORY:   Past Surgical History:  Procedure Laterality Date  . ABDOMINAL HYSTERECTOMY    . APPENDECTOMY    . BOWEL RESECTION    . CHOLECYSTECTOMY    . DILATATION & CURETTAGE/HYSTEROSCOPY WITH MYOSURE    . PANCREAS SURGERY      SOCIAL HISTORY:   Social History  Substance Use Topics  . Smoking status: Never Smoker  . Smokeless  tobacco: Former Systems developer  . Alcohol use No    FAMILY HISTORY:  History reviewed. No pertinent family history.  DRUG ALLERGIES:   Allergies  Allergen Reactions  . Demerol [Meperidine] Other (See Comments)    Pt states that this medication causes cardiac arrest.    . Dilaudid [Hydromorphone Hcl] Other (See Comments)    Pt states that this medication causes cardiac arrest.    . Morphine And Related Other (See Comments)    Pt states that this medication causes cardiac arrest.    . Darvon [Propoxyphene] Nausea And Vomiting and Rash  . Flexeril [Cyclobenzaprine] Nausea And Vomiting and Rash  . Levaquin [Levofloxacin In D5w] Nausea And Vomiting and Rash  . Soma [Carisoprodol] Nausea And Vomiting and Rash  . Zofran [Ondansetron Hcl] Nausea And Vomiting and Rash    REVIEW OF SYSTEMS:   Review of Systems  Constitutional: Negative.  Negative for chills, fever and malaise/fatigue.  HENT: Negative.  Negative for ear discharge, ear pain, hearing loss, nosebleeds and sore throat.   Eyes: Negative.  Negative for blurred vision and pain.  Respiratory: Negative.  Negative for cough, hemoptysis, shortness of breath and wheezing.   Cardiovascular: Positive for chest pain. Negative for palpitations and leg swelling.  Gastrointestinal: Positive for nausea. Negative for abdominal pain, blood in stool, diarrhea and vomiting.  Genitourinary: Negative.  Negative for dysuria.  Musculoskeletal: Negative.  Negative for back pain.  Skin: Negative.   Neurological: Negative  for dizziness, tremors, speech change, focal weakness, seizures and headaches.  Endo/Heme/Allergies: Negative.  Does not bruise/bleed easily.  Psychiatric/Behavioral: Negative.  Negative for depression, hallucinations and suicidal ideas.    MEDICATIONS AT HOME:   Prior to Admission medications   Medication Sig Start Date End Date Taking? Authorizing Provider  amLODipine (NORVASC) 5 MG tablet Take 5 mg by mouth daily.   Yes Historical  Provider, MD  cloNIDine (CATAPRES) 0.1 MG tablet Take 0.1 mg by mouth 2 (two) times daily.   Yes Historical Provider, MD  insulin glargine (LANTUS) 100 UNIT/ML injection Inject 10 Units into the skin at bedtime.    Yes Historical Provider, MD  insulin lispro (HUMALOG) 100 UNIT/ML injection Inject into the skin 3 (three) times daily with meals as needed for high blood sugar. Pt uses based off the amount of carbs she eats at each meal.   Yes Historical Provider, MD  pantoprazole (PROTONIX) 40 MG tablet Take 40 mg by mouth 2 (two) times daily.   Yes Historical Provider, MD  vitamin E 400 UNIT capsule Take 400 Units by mouth daily.   Yes Historical Provider, MD  lipase/protease/amylase (CREON) 12000 units CPEP capsule Take 3,600 Units by mouth 4 (four) times daily -  with meals and at bedtime.    Historical Provider, MD  lisinopril (PRINIVIL,ZESTRIL) 20 MG tablet Take 20 mg by mouth daily.    Historical Provider, MD      VITAL SIGNS:  Blood pressure (!) 185/119, pulse 78, temperature 97.6 F (36.4 C), temperature source Oral, resp. rate 16, height 5' 4"  (1.626 m), weight 60.8 kg (134 lb), SpO2 97 %.  PHYSICAL EXAMINATION:   Physical Exam  Constitutional: She is oriented to person, place, and time and well-developed, well-nourished, and in no distress. No distress.  HENT:  Head: Normocephalic.  Eyes: Scleral icterus is present.  Neck: Normal range of motion. Neck supple. No JVD present. No tracheal deviation present.  Cardiovascular: Normal rate, regular rhythm and normal heart sounds.  Exam reveals no gallop and no friction rub.   No murmur heard. Pulmonary/Chest: Effort normal and breath sounds normal. No respiratory distress. She has no wheezes. She has no rales. She exhibits no tenderness.  Abdominal: Soft. Bowel sounds are normal. She exhibits no distension and no mass. There is no tenderness. There is no rebound and no guarding.  Musculoskeletal: Normal range of motion. She exhibits no  edema.  Neurological: She is alert and oriented to person, place, and time.  Skin: Skin is warm. No rash noted. No erythema.  Psychiatric: Affect and judgment normal.      LABORATORY PANEL:   CBC  Recent Labs Lab 09/28/15 0656  WBC 5.6  HGB 13.4  HCT 37.3  PLT 130*   ------------------------------------------------------------------------------------------------------------------  Chemistries   Recent Labs Lab 09/28/15 0656  NA 135  K 2.8*  CL 102  CO2 25  GLUCOSE 331*  BUN 10  CREATININE 0.57  CALCIUM 7.9*  AST 32  ALT 34  ALKPHOS 109  BILITOT 4.1*   ------------------------------------------------------------------------------------------------------------------  Cardiac Enzymes  Recent Labs Lab 09/28/15 1031  TROPONINI <0.03   ------------------------------------------------------------------------------------------------------------------  RADIOLOGY:  Dg Chest 2 View  Result Date: 09/28/2015 CLINICAL DATA:  Initial evaluation for acute chest pain. EXAM: CHEST  2 VIEW COMPARISON:  Prior radiograph from 02/27/2015. FINDINGS: The cardiac and mediastinal silhouettes are stable in size and contour, and remain within normal limits. The lungs are normally inflated. No airspace consolidation, pleural effusion, or pulmonary edema is identified. There  is no pneumothorax. No acute osseous abnormality identified. IMPRESSION: No active cardiopulmonary disease. Electronically Signed   By: Jeannine Boga M.D.   On: 09/28/2015 06:38    EKG:   Normal sinus rhythm with LVH no ST elevation or depression.  IMPRESSION AND PLAN:   50 year old female with a history of diabetes due to pancreatic disease, liver disease of unknown etiology, and uncontrolled blood pressure who recently ran out of medications who presents with unstable angina.  1. Unstable angina: Patient reports chest pain, shortness of breath, diaphoresis and nausea relieved with nitroglycerin. Her  symptoms in addition to diabetes and uncontrolled blood pressure are concerning for unstable angina. Patient will be admitted to telemetry unit. Patient will need ongoing monitoring on telemetry as well as serial troponins. If her troponins are negative she will undergo cardiac stress test in a.m. If her troponins do become positive and we will need radiology consultation. Start aspirin. Check lipid panel. Continue lisinopril, Norvasc and clonidine. Patient would most likely benefit  From adding BB to current regimen.  2. Uncontrolled hypertension: EKG is suggestive of uncontrolled hypertension with LVH. Patient needs to be restarted on her medication. I also added Coreg. Continue to monitor blood pressure. At discharge patient will need close follow-up with her primary care physician.  3. Diabetes: She will continue ADA diet and current insulin dose. Next line placed the patient on signs constant. OBtain diabetes coordinator consult.  4. History of pancreatic disorder: Continue Creon.  5. Liver disease of unknown etiology: Patient needs an appointment with outpatient GI. It appears that she was lost to follow-up. On physical examination sclerae are icteric. 6. Severe hypokalemia: Check magnesium. We'll have pharmacy help with repletion of electrodes.  All the records are reviewed and case discussed with ED provider. Management plans discussed with the patient and she in agreement  CODE STATUS: full  TOTAL TIME TAKING CARE OF THIS PATIENT: 45 minutes.    Aujanae Mccullum M.D on 09/28/2015 at 12:14 PM  Between 7am to 6pm - Pager - [phone]  After 6pm go to [email or web address] - password [facility]  Office  [phone]  CC: Primary care physician; Glendon Axe, MD

## 2015-09-28 NOTE — ED Notes (Signed)
Pt reports she has not taken creon in 3 months, blood pressure meds in 4 days, and took last dose of insulin yesterday. Pt does not have insurance/money to purchase refills

## 2015-09-28 NOTE — Progress Notes (Signed)
Inpatient Diabetes Program Recommendations  AACE/ADA: New Consensus Statement on Inpatient Glycemic Control (2015)  Target Ranges:  Prepandial:   less than 140 mg/dL      Peak postprandial:   less than 180 mg/dL (1-2 hours)      Critically ill patients:  140 - 180 mg/dL   Results for Amber Christensen, Amber Christensen (MRN [id number]) as of 09/28/2015 14:14  Ref. Range 09/28/2015 06:56  Sodium Latest Ref Range: 135 - 145 mmol/L 135  Potassium Latest Ref Range: 3.5 - 5.1 mmol/L 2.8 (L)  Chloride Latest Ref Range: 101 - 111 mmol/L 102  CO2 Latest Ref Range: 22 - 32 mmol/L 25  BUN Latest Ref Range: 6 - 20 mg/dL 10  Creatinine Latest Ref Range: 0.44 - 1.00 mg/dL 0.57  Calcium Latest Ref Range: 8.9 - 10.3 mg/dL 7.9 (L)  EGFR (Non-African Amer.) Latest Ref Range: >60 mL/min >60  EGFR (African American) Latest Ref Range: >60 mL/min >60  Glucose Latest Ref Range: 65 - 99 mg/dL 331 (H)  Anion gap Latest Ref Range: 5 - 15  8    Admit with: CP  History: DM, Liver and Pancreatic Disease, Partial Pancreatectomy  Home DM Meds: Lantus 10 units QHS       Humalog 1 unit for every 15 grams Carbohydrates       Humalog 1 unit for every 50 mg/dl above target CBG of 130 mg/dl  Current Insulin Orders: Lantus 10 units QHS      Novolog Moderate Correction Scale/ SSI (0-15 units) TID AC + HS      -Note patient saw her Endocrinologist Dr. Lucilla Lame on 06/26/15.  No changes made to her insulin regimen at this visit.  Patient was advised to check her CBGs more consistently at home.  -Current A1c pending.  -Note patient to start Lantus and Novolog today.    MD- Once patient begins PO diet, she may need added Novolog Meal Coverage to cover her Carbohydrate intake.  Please also start Novolog 4 units tid with meals (hold if pt eats <50% of meal)     --Will follow patient during hospitalization--  Wyn Quaker RN, MSN, CDE Diabetes Coordinator Inpatient Glycemic Control Team Team Pager: [phone]  (8a-5p)

## 2015-09-28 NOTE — ED Notes (Signed)
Patient transported to X-ray 

## 2015-09-28 NOTE — Progress Notes (Addendum)
MEDICATION RELATED CONSULT NOTE - INITIAL   Pharmacy Consult for Electrolyte management Indication: Electrolyte management   Allergies  Allergen Reactions  . Demerol [Meperidine] Other (See Comments)    Pt states that this medication causes cardiac arrest.    . Dilaudid [Hydromorphone Hcl] Other (See Comments)    Pt states that this medication causes cardiac arrest.    . Morphine And Related Other (See Comments)    Pt states that this medication causes cardiac arrest.    . Darvon [Propoxyphene] Nausea And Vomiting and Rash  . Flexeril [Cyclobenzaprine] Nausea And Vomiting and Rash  . Levaquin [Levofloxacin In D5w] Nausea And Vomiting and Rash  . Soma [Carisoprodol] Nausea And Vomiting and Rash  . Zofran [Ondansetron Hcl] Nausea And Vomiting and Rash    Patient Measurements: Height: 5' 4"  (162.6 cm) Weight: 134 lb (60.8 kg) IBW/kg (Calculated) : 54.7 Adjusted Body Weight:   Vital Signs: Temp: 98.5 F (36.9 C) (08/14 1508) Temp Source: Oral (08/14 1508) BP: 168/101 (08/14 1508) Pulse Rate: 79 (08/14 1508) Intake/Output from previous day: No intake/output data recorded. Intake/Output from this shift: No intake/output data recorded.  Labs:  Recent Labs  09/28/15 0656  WBC 5.6  HGB 13.4  HCT 37.3  PLT 130*  CREATININE 0.57  ALBUMIN 3.7  PROT 6.3*  AST 32  ALT 34  ALKPHOS 109  BILITOT 4.1*  BILIDIR 0.2  IBILI 3.9*   Estimated Creatinine Clearance: 72.6 mL/min (by C-G formula based on SCr of 0.8 mg/dL).   Microbiology: No results found for this or any previous visit (from the past 720 hour(s)).  Medical History: Past Medical History:  Diagnosis Date  . Diabetes mellitus without complication ([place])   . GERD (gastroesophageal reflux disease)   . Hypertension   . Liver disease   . Pancreatitis     Medications:  Prescriptions Prior to Admission  Medication Sig Dispense Refill Last Dose  . amLODipine (NORVASC) 5 MG tablet Take 5 mg by mouth daily.   Past  Month at Unknown time  . cloNIDine (CATAPRES) 0.1 MG tablet Take 0.1 mg by mouth 2 (two) times daily.   Past Week at Unknown time  . insulin glargine (LANTUS) 100 UNIT/ML injection Inject 10 Units into the skin at bedtime.    Past Week at Unknown time  . insulin lispro (HUMALOG) 100 UNIT/ML injection Inject into the skin 3 (three) times daily with meals as needed for high blood sugar. Pt uses based off the amount of carbs she eats at each meal.   09/28/2015 at 0430  . pantoprazole (PROTONIX) 40 MG tablet Take 40 mg by mouth 2 (two) times daily.   Past Month at Unknown time  . vitamin E 400 UNIT capsule Take 400 Units by mouth daily.   Past Week at Unknown time  . lipase/protease/amylase (CREON) 12000 units CPEP capsule Take 3,600 Units by mouth 4 (four) times daily -  with meals and at bedtime.   Not Taking at Unknown time  . lisinopril (PRINIVIL,ZESTRIL) 20 MG tablet Take 20 mg by mouth daily.   09/24/2015   Scheduled:  . amLODipine  10 mg Oral Daily  . aspirin  81 mg Oral Daily  . carvedilol  3.125 mg Oral BID WC  . cloNIDine  0.1 mg Oral BID  . enoxaparin (LOVENOX) injection  40 mg Subcutaneous Q24H  . insulin aspart  0-15 Units Subcutaneous TID WC  . insulin aspart  0-5 Units Subcutaneous QHS  . insulin glargine  10 Units  Subcutaneous QHS  . lipase/protease/amylase  36,000 Units Oral TID WC & HS  . [START ON 09/29/2015] lisinopril  20 mg Oral Daily  . pantoprazole  40 mg Oral BID  . potassium chloride  10 mEq Intravenous Once  . potassium chloride  10 mEq Intravenous Q1 Hr x 4  . sodium chloride flush  3 mL Intravenous Q12H  . vitamin E  400 Units Oral Daily    Assessment: Pharmacy consulted to assist in the management of electrolytes in this 50 year old female.   K= 2.8   Goal of Therapy:    Plan:  Will give KCl 10 mEq IV x 5. Will recheck K @ 21:00. Pharmacy to follow per consult.      ADD:  Per MD, patient can't tolerate IV KCl; therefore will change to oral  KlorCon Amber Christensen D 09/28/2015,3:37 PM

## 2015-09-28 NOTE — ED Notes (Signed)
Pt transported to floor on monitor with tech and Sharee Pimple, Therapist, sports. Pt given graham crackers prior to admission.

## 2015-09-29 ENCOUNTER — Encounter: Payer: Self-pay | Admitting: Radiology

## 2015-09-29 ENCOUNTER — Observation Stay: Payer: Medicare Other

## 2015-09-29 LAB — BASIC METABOLIC PANEL
Anion gap: 7 (ref 5–15)
BUN: 13 mg/dL (ref 6–20)
CO2: 25 mmol/L (ref 22–32)
Calcium: 7.2 mg/dL — ABNORMAL LOW (ref 8.9–10.3)
Chloride: 107 mmol/L (ref 101–111)
Creatinine, Ser: 0.54 mg/dL (ref 0.44–1.00)
GFR calc Af Amer: >60 mL/min (ref >60)
GFR calc non Af Amer: >60 mL/min (ref >60)
Glucose, Bld: 214 mg/dL — ABNORMAL HIGH (ref 65–99)
Potassium: 3.5 mmol/L (ref 3.5–5.1)
Sodium: 139 mmol/L (ref 135–145)

## 2015-09-29 LAB — NM MYOCAR MULTI W/SPECT W/WALL MOTION / EF
Estimated workload: 1 METS
Exercise duration (min): 1 min
Exercise duration (sec): 2 s
LV dias vol: 66 mL (ref 46–106)
LV sys vol: 23 mL
MPHR: 170 {beats}/min
Peak HR: 104 {beats}/min
Percent HR: 61 %
Rest HR: 67 {beats}/min
SDS: 3
SRS: 0
SSS: 2
TID: 1.32

## 2015-09-29 LAB — GLUCOSE, CAPILLARY
Glucose-Capillary: 197 mg/dL — ABNORMAL HIGH (ref 65–99)
Glucose-Capillary: 375 mg/dL — ABNORMAL HIGH (ref 65–99)
Glucose-Capillary: 175 mg/dL — ABNORMAL HIGH (ref 65–99)

## 2015-09-29 LAB — CBC
HCT: 35.4 % (ref 35.0–47.0)
Hemoglobin: 12.4 g/dL (ref 12.0–16.0)
MCH: 31 pg (ref 26.0–34.0)
MCHC: 35 g/dL (ref 32.0–36.0)
MCV: 88.6 fL (ref 80.0–100.0)
Platelets: 124 10*3/uL — ABNORMAL LOW (ref 150–440)
RBC: 3.99 MIL/uL (ref 3.80–5.20)
RDW: 14 % (ref 11.5–14.5)
WBC: 6.5 10*3/uL (ref 3.6–11.0)

## 2015-09-29 LAB — TROPONIN I
Troponin I: <0.03 ng/mL (ref <0.03)
Troponin I: <0.03 ng/mL (ref <0.03)

## 2015-09-29 MED ORDER — PANCRELIPASE (LIP-PROT-AMYL) 12000-38000 UNITS PO CPEP: 3600.0000 [IU] | ORAL_CAPSULE | Freq: Three times a day (TID) | ORAL | 0 refills | Status: DC

## 2015-09-29 MED ORDER — LISINOPRIL 20 MG PO TABS: 20.0000 mg | ORAL_TABLET | Freq: Every day | ORAL | 1 refills | Status: DC

## 2015-09-29 MED ORDER — AMLODIPINE BESYLATE 5 MG PO TABS: 5.0000 mg | ORAL_TABLET | Freq: Every day | ORAL | 1 refills | Status: DC

## 2015-09-29 MED ORDER — CLONIDINE HCL 0.1 MG PO TABS: 0.1000 mg | ORAL_TABLET | Freq: Two times a day (BID) | ORAL | 11 refills | Status: DC

## 2015-09-29 MED ORDER — INSULIN LISPRO 100 UNIT/ML ~~LOC~~ SOLN: 4.0000 [IU] | Freq: Three times a day (TID) | SUBCUTANEOUS | 11 refills | Status: DC

## 2015-09-29 MED ORDER — TECHNETIUM TC 99M TETROFOSMIN IV KIT
32.4700 | PACK | Freq: Once | INTRAVENOUS | Status: AC | PRN
  Administered 2015-09-29: 32.47 via INTRAVENOUS

## 2015-09-29 MED ORDER — REGADENOSON 0.4 MG/5ML IV SOLN
0.4000 mg | Freq: Once | INTRAVENOUS | Status: AC
  Administered 2015-09-29: 0.4 mg via INTRAVENOUS

## 2015-09-29 MED ORDER — TECHNETIUM TC 99M TETROFOSMIN IV KIT
12.6500 | PACK | Freq: Once | INTRAVENOUS | Status: AC | PRN
  Administered 2015-09-29: 12.65 via INTRAVENOUS

## 2015-09-29 MED ORDER — PANTOPRAZOLE SODIUM 40 MG PO TBEC: 40.0000 mg | DELAYED_RELEASE_TABLET | Freq: Two times a day (BID) | ORAL | 1 refills | Status: DC

## 2015-09-29 MED ORDER — INSULIN GLARGINE 100 UNIT/ML ~~LOC~~ SOLN: 12.0000 [IU] | Freq: Every day | SUBCUTANEOUS | 11 refills | Status: DC

## 2015-09-29 NOTE — Progress Notes (Signed)
Spoke with dr. Verdell Carmine. Per md he spoke with dr. Clayborn Bigness, stress test is negative. Okay for discharge

## 2015-09-29 NOTE — Care Management Obs Status (Signed)
[facility] NOTIFICATION   Patient Details  Name: Amber Christensen MRN: [id number] Date of Birth: 09/11/1965   Medicare Observation Status Notification Given:  Yes    Jolly Mango, RN 09/29/2015, 10:21 AM

## 2015-09-29 NOTE — Progress Notes (Signed)
Patient resting in bed no c/o pain no distress noted. Tele reading nsr 77. Patient waiting on stress test result, possible discharge depending on results. Will continue to monitor

## 2015-09-29 NOTE — Progress Notes (Signed)
MEDICATION RELATED CONSULT NOTE - INITIAL   Pharmacy Consult for Electrolyte management Indication: Electrolyte management   Allergies  Allergen Reactions  . Demerol [Meperidine] Other (See Comments)    Pt states that this medication causes cardiac arrest.    . Dilaudid [Hydromorphone Hcl] Other (See Comments)    Pt states that this medication causes cardiac arrest.    . Morphine And Related Other (See Comments)    Pt states that this medication causes cardiac arrest.    . Darvon [Propoxyphene] Nausea And Vomiting and Rash  . Flexeril [Cyclobenzaprine] Nausea And Vomiting and Rash  . Levaquin [Levofloxacin In D5w] Nausea And Vomiting and Rash  . Soma [Carisoprodol] Nausea And Vomiting and Rash  . Zofran [Ondansetron Hcl] Nausea And Vomiting and Rash    Patient Measurements: Height: 5' 4"  (162.6 cm) Weight: 130 lb (59 kg) IBW/kg (Calculated) : 54.7 Adjusted Body Weight:   Vital Signs: Temp: 98.3 F (36.8 C) (08/15 0514) Temp Source: Oral (08/15 0514) BP: 127/78 (08/15 0514) Pulse Rate: 68 (08/15 0514) Intake/Output from previous day: 08/14 0701 - 08/15 0700 In: 240 [P.O.:240] Out: 0  Intake/Output from this shift: Total I/O In: 240 [P.O.:240] Out: -   Labs:  Recent Labs  09/28/15 0656 09/28/15 1540 09/29/15 0332  WBC 5.6 7.4 6.5  HGB 13.4 14.1 12.4  HCT 37.3 39.9 35.4  PLT 130* 145* 124*  CREATININE 0.57 0.56 0.54  MG  --  1.6*  --   PHOS  --  3.9  --   ALBUMIN 3.7  --   --   PROT 6.3*  --   --   AST 32  --   --   ALT 34  --   --   ALKPHOS 109  --   --   BILITOT 4.1*  --   --   BILIDIR 0.2  --   --   IBILI 3.9*  --   --    Estimated Creatinine Clearance: 72.6 mL/min (by C-G formula based on SCr of 0.8 mg/dL).   Microbiology: No results found for this or any previous visit (from the past 720 hour(s)).  Medical History: Past Medical History:  Diagnosis Date  . Diabetes mellitus without complication ([place])   . GERD (gastroesophageal reflux disease)    . Hypertension   . Liver disease   . Pancreatitis     Medications:  Prescriptions Prior to Admission  Medication Sig Dispense Refill Last Dose  . amLODipine (NORVASC) 5 MG tablet Take 5 mg by mouth daily.   Past Month at Unknown time  . cloNIDine (CATAPRES) 0.1 MG tablet Take 0.1 mg by mouth 2 (two) times daily.   Past Week at Unknown time  . insulin glargine (LANTUS) 100 UNIT/ML injection Inject 10 Units into the skin at bedtime.    Past Week at Unknown time  . insulin lispro (HUMALOG) 100 UNIT/ML injection Inject into the skin 3 (three) times daily with meals as needed for high blood sugar. Pt uses based off the amount of carbs she eats at each meal.   09/28/2015 at 0430  . pantoprazole (PROTONIX) 40 MG tablet Take 40 mg by mouth 2 (two) times daily.   Past Month at Unknown time  . vitamin E 400 UNIT capsule Take 400 Units by mouth daily.   Past Week at Unknown time  . lipase/protease/amylase (CREON) 12000 units CPEP capsule Take 3,600 Units by mouth 4 (four) times daily -  with meals and at bedtime.   Not Taking  at Unknown time  . lisinopril (PRINIVIL,ZESTRIL) 20 MG tablet Take 20 mg by mouth daily.   09/24/2015   Scheduled:  . amLODipine  10 mg Oral Daily  . aspirin  81 mg Oral Daily  . carvedilol  3.125 mg Oral BID WC  . cloNIDine  0.1 mg Oral BID  . enoxaparin (LOVENOX) injection  40 mg Subcutaneous Q24H  . insulin aspart  0-15 Units Subcutaneous TID WC  . insulin aspart  0-5 Units Subcutaneous QHS  . insulin glargine  10 Units Subcutaneous QHS  . lipase/protease/amylase  36,000 Units Oral TID WC & HS  . lisinopril  20 mg Oral Daily  . pantoprazole  40 mg Oral BID  . potassium chloride  40 mEq Oral BID  . sodium chloride flush  3 mL Intravenous Q12H  . vitamin E  400 Units Oral Daily    Assessment: Pharmacy consulted to assist in the management of electrolytes in this 50 year old female.   K= 2.8   Goal of Therapy:    Plan:  K 3.5. Remains on 40 mEq PO BID. Will  continue as is and recheck labs tomorrow AM.   Laural Benes, Pharm.D., BCPS Clinical Pharmacist 09/29/2015,5:43 AM

## 2015-09-29 NOTE — Progress Notes (Signed)
Discharge instructions along with home medication list gone over with patient. Printed rx given to patient. Case manager has spoken with patient and set up needs. No c/o pain no distress noted. Patient to be discharged home. Iv and telemetry removed.

## 2015-09-29 NOTE — Progress Notes (Signed)
A&O. Standby assist. BP running somewhat low tonight, systolically in the 28'Z. Patient complained of dizziness. Dr. Ara Kussmaul notified and IV fluids given. Patient stated she felt a little better and slept well. Probable stress test this AM.

## 2015-09-29 NOTE — Discharge Summary (Signed)
[facility] [place] NAME: Amber Christensen    MR#:  [id number]  DATE OF BIRTH:  01-15-1966  DATE OF ADMISSION:  09/28/2015 ADMITTING PHYSICIAN: Bettey Costa, MD  DATE OF DISCHARGE: 09/29/2015  PRIMARY CARE PHYSICIAN: Singh,Jasmine, MD    ADMISSION DIAGNOSIS:  Chest pain, unspecified chest pain type [R07.9]  DISCHARGE DIAGNOSIS:  Active Problems:   Unstable angina ([place])   SECONDARY DIAGNOSIS:   Past Medical History:  Diagnosis Date  . Diabetes mellitus without complication ([place])   . GERD (gastroesophageal reflux disease)   . Hypertension   . Liver disease   . Pancreatitis     HOSPITAL COURSE:   50 year old female with past medical history of diabetes, hypertension, chronic pancreatitis, GERD who presented to the hospital due to chest pain.  1. Chest pain-given the patient's risk factors she was observed overnight on telemetry, had serial cardiac markers checked which were negative. She underwent a nuclear medicine stress test which shows no evidence of wall motion abnormalities or acute ischemia. -She is currently chest pain-free and hemodynamically stable and therefore being discharged home. The most likely cause of her chest pain was anxiety and stress mediated.  2. Essential hypertension-she will resume her Norvasc lisinopril, clonidine.  3. Diabetes type 2 without complication-she will resume her Lantus, lispro with meals.  4. Pancreatitis-patient will resume her Creon supplements.  Patient was seen by care manager and help with her prescription medications as she had no insurance for prescription coverage. She was given prescriptions for all her medications mentioned below.  DISCHARGE CONDITIONS:   Stable.   CONSULTS OBTAINED:    DRUG ALLERGIES:   Allergies  Allergen Reactions  . Demerol [Meperidine] Other (See Comments)    Pt states that this medication causes cardiac arrest.    . Dilaudid [Hydromorphone Hcl] Other  (See Comments)    Pt states that this medication causes cardiac arrest.    . Morphine And Related Other (See Comments)    Pt states that this medication causes cardiac arrest.    . Darvon [Propoxyphene] Nausea And Vomiting and Rash  . Flexeril [Cyclobenzaprine] Nausea And Vomiting and Rash  . Levaquin [Levofloxacin In D5w] Nausea And Vomiting and Rash  . Soma [Carisoprodol] Nausea And Vomiting and Rash  . Zofran [Ondansetron Hcl] Nausea And Vomiting and Rash    DISCHARGE MEDICATIONS:     Medication List    TAKE these medications   amLODipine 5 MG tablet Commonly known as:  NORVASC Take 1 tablet (5 mg total) by mouth daily.   cloNIDine 0.1 MG tablet Commonly known as:  CATAPRES Take 1 tablet (0.1 mg total) by mouth 2 (two) times daily.   insulin glargine 100 UNIT/ML injection Commonly known as:  LANTUS Inject 0.12 mLs (12 Units total) into the skin at bedtime. What changed:  how much to take   insulin lispro 100 UNIT/ML injection Commonly known as:  HUMALOG Inject 0.04 mLs (4 Units total) into the skin 3 (three) times daily with meals. Pt uses based off the amount of carbs she eats at each meal. What changed:  how much to take  when to take this  reasons to take this   lipase/protease/amylase 12000 units Cpep capsule Commonly known as:  CREON Take 1 capsule (12,000 Units total) by mouth 4 (four) times daily -  with meals and at bedtime. What changed:  how much to take   lisinopril 20 MG tablet Commonly known as:  PRINIVIL,ZESTRIL Take 1 tablet (  20 mg total) by mouth daily.   pantoprazole 40 MG tablet Commonly known as:  PROTONIX Take 1 tablet (40 mg total) by mouth 2 (two) times daily.   vitamin E 400 UNIT capsule Take 400 Units by mouth daily.         DISCHARGE INSTRUCTIONS:   DIET:  Cardiac diet and Diabetic diet  DISCHARGE CONDITION:  Stable  ACTIVITY:  Activity as tolerated  OXYGEN:  Home Oxygen: No.   Oxygen Delivery: room  air  DISCHARGE LOCATION:  home   If you experience worsening of your admission symptoms, develop shortness of breath, life threatening emergency, suicidal or homicidal thoughts you must seek medical attention immediately by calling 911 or calling your MD immediately  if symptoms less severe.  You Must read complete instructions/literature along with all the possible adverse reactions/side effects for all the Medicines you take and that have been prescribed to you. Take any new Medicines after you have completely understood and accpet all the possible adverse reactions/side effects.   Please note  You were cared for by a hospitalist during your hospital stay. If you have any questions about your discharge medications or the care you received while you were in the hospital after you are discharged, you can call the unit and asked to speak with the hospitalist on call if the hospitalist that took care of you is not available. Once you are discharged, your primary care physician will handle any further medical issues. Please note that NO REFILLS for any discharge medications will be authorized once you are discharged, as it is imperative that you return to your primary care physician (or establish a relationship with a primary care physician if you do not have one) for your aftercare needs so that they can reassess your need for medications and monitor your lab values.     Today   No Chest pain presently.  NO acute events overnight.  No shortness of breath.    VITAL SIGNS:  Blood pressure 110/75, pulse 76, temperature 97.8 F (36.6 C), temperature source Oral, resp. rate (!) 21, height 5' 4"  (1.626 m), weight 59.6 kg (131 lb 4.8 oz), SpO2 99 %.  I/O:   Intake/Output Summary (Last 24 hours) at 09/29/15 1650 Last data filed at 09/29/15 1350  Gross per 24 hour  Intake              480 ml  Output              800 ml  Net             -320 ml    PHYSICAL EXAMINATION:  GENERAL:  50  y.o.-year-old patient lying in the bed with no acute distress.  EYES: Pupils equal, round, reactive to light and accommodation. No scleral icterus. Extraocular muscles intact.  HEENT: Head atraumatic, normocephalic. Oropharynx and nasopharynx clear.  NECK:  Supple, no jugular venous distention. No thyroid enlargement, no tenderness.  LUNGS: Normal breath sounds bilaterally, no wheezing, rales,rhonchi. No use of accessory muscles of respiration.  CARDIOVASCULAR: S1, S2 normal. No murmurs, rubs, or gallops.  ABDOMEN: Soft, non-tender, non-distended. Bowel sounds present. No organomegaly or mass.  EXTREMITIES: No pedal edema, cyanosis, or clubbing.  NEUROLOGIC: Cranial nerves II through XII are intact. No focal motor or sensory defecits b/l.  PSYCHIATRIC: The patient is alert and oriented x 3. Good affect.  SKIN: No obvious rash, lesion, or ulcer.   DATA REVIEW:   CBC  Recent Labs Lab 09/29/15 [phone]  WBC 6.5  HGB 12.4  HCT 35.4  PLT 124*    Chemistries   Recent Labs Lab 09/28/15 0656 09/28/15 1540 09/29/15 0332  NA 135  --  139  K 2.8*  --  3.5  CL 102  --  107  CO2 25  --  25  GLUCOSE 331*  --  214*  BUN 10  --  13  CREATININE 0.57 0.56 0.54  CALCIUM 7.9*  --  7.2*  MG  --  1.6*  --   AST 32  --   --   ALT 34  --   --   ALKPHOS 109  --   --   BILITOT 4.1*  --   --     Cardiac Enzymes  Recent Labs Lab 09/29/15 1306  TROPONINI <0.03    Microbiology Results  No results found for this or any previous visit.  RADIOLOGY:  Dg Chest 2 View  Result Date: 09/28/2015 CLINICAL DATA:  Initial evaluation for acute chest pain. EXAM: CHEST  2 VIEW COMPARISON:  Prior radiograph from 02/27/2015. FINDINGS: The cardiac and mediastinal silhouettes are stable in size and contour, and remain within normal limits. The lungs are normally inflated. No airspace consolidation, pleural effusion, or pulmonary edema is identified. There is no pneumothorax. No acute osseous abnormality  identified. IMPRESSION: No active cardiopulmonary disease. Electronically Signed   By: Jeannine Boga M.D.   On: 09/28/2015 06:38      Management plans discussed with the patient, family and they are in agreement.  CODE STATUS:     Code Status Orders        Start     Ordered   09/28/15 1510  Full code  Continuous     09/28/15 1509    Code Status History    Date Active Date Inactive Code Status Order ID Comments User Context   This patient has a current code status but no historical code status.      TOTAL TIME TAKING CARE OF THIS PATIENT: 40 minutes.    Henreitta Leber M.D on 09/29/2015 at 4:50 PM  Between 7am to 6pm - Pager - [phone]  After 6pm go to [email or web address] - password [email or web address] [facility]  Office  [phone]  CC: Primary care physician; Glendon Axe, MD

## 2015-09-29 NOTE — Care Management (Signed)
Spoke with patient at bedside. She has no Medicare D coverage for medications. Application given for Medication Management. PCP is J. Singh. Patient is on disability and husband recently lost his job which caused her to loose her prescription drug coverage through [place]. She is independent, active with no other needs. Offered to fax prescriptions and application but patient did  Not have her glasses.

## 2015-09-29 NOTE — Progress Notes (Addendum)
Inpatient Diabetes Program Recommendations  AACE/ADA: New Consensus Statement on Inpatient Glycemic Control (2015)  Target Ranges:  Prepandial:   less than 140 mg/dL      Peak postprandial:   less than 180 mg/dL (1-2 hours)      Critically ill patients:  140 - 180 mg/dL   Results for TAKIAH, MAIDEN (MRN [id number]) as of 09/29/2015 08:05  Ref. Range 09/28/2015 15:17 09/28/2015 16:12 09/28/2015 20:38  Glucose-Capillary Latest Ref Range: 65 - 99 mg/dL 246 (H) 287 (H) 362 (H)   Results for CHEYANNE, LAMISON (MRN [id number]) as of 09/29/2015 08:05  Ref. Range 09/28/2015 15:40 09/29/2015 03:32  Hemoglobin A1C Latest Ref Range: 4.0 - 6.0 % 10.6 (H)   Glucose Latest Ref Range: 65 - 99 mg/dL  214 (H)   Review of Glycemic Control  Outpatient Diabetes medications: Lantus 10 units QHS, Humalog 1 unit for every 15 grams of carbohydrates, Humalog 1 unit for every 50 mg/dl above target glucose of 130 mg/dl Current orders for Inpatient glycemic control: Lantus 10 units QHS, Novolog 0-15 units TID with meals, Novolog 0-5 units QHS  Inpatient Diabetes Program Recommendations: Insulin - Basal: Please consider increasing Lantus to 12 untis QHS. Insulin - Meal Coverage: Please order Novolog 4 units TID with meals for meal coverage if patient eats at least 50% of meal (in addition to Novolog correction scale).  NOTE: Spoke with patient about diabetes and home regimen for diabetes control. Patient reports that she is followed by Dr. Gabriel Carina for diabetes management and currently she takes Lantus 10 units QHS, Humalog 1 unit for every 15 grams of carbs and Humalog 1 unit for every 50 mg/dl over glucose of 130 mg/dl as an outpatient for diabetes control. Patient states that she had a partial pancreatectomy (per patient 80% of pancrease removed) in 2013 and has been on insulin since then. Patient last seen Dr. Gabriel Carina on 06/26/2015 and reports that she is taking insulin as prescribed but her glucose fluctuates widely. Patient admits  that she is not checking her glucose as often as Dr. Gabriel Carina has requested because she does not have enough supplies to check that often and she no longer has insurance to cover her testing supplies. Patient also reports that due to her husband recently being laid off she was not able to pay her premium for prescriptions and she can not afford the cost of her insulins. Discussed [facility] and patient reports that case manager has already given her an application for the [facility] and she is planning to fill it out and see if she can get help with her medications. Patient also reports that she does not have any more insulin syringes at home but she states that Case Management is working on getting her some insulin syringes. Informed patient that if she can not afford the test strips for her One Touch glucometer (typically name brand meter strips are over $1 each), she may want to consider the Reli-On glucometer at [facility] for $9 and a box of 50 test strips for $9. Also encouraged patient to check with the [facility] to see if she can get testing supplies from them.  Discussed A1C results (10.6% on 09/28/15) and explained that her current A1C indicates an average glucose of 258 mg/dl over the past 2-3 months. Noted in chart review that prior A1C was 8.1% on 06/24/2015. Discussed glucose and A1C goals. Discussed importance of checking CBGs and maintaining good CBG control to prevent long-term and short-term complications.  Reviewed how hyperglycemia leads to damage within blood vessels which lead to the common complications seen with uncontrolled diabetes. Stressed to the patient the importance of improving glycemic control to prevent further complications from uncontrolled diabetes. Encouraged patient to call Dr. Joycie Peek office and go ahead and make a follow up appointment if she doesn't have one scheduled this month. Encouraged patient to check her glucose 3-4 times  per day (before meals and at bedtime) and to keep a log book of glucose readings and insulin taken which she will need to take to follow up appointments with Dr. Gabriel Carina. Explained that Dr. Gabriel Carina needs that information for her glucose readings to make adjustments with her DM medications. Patient verbalized understanding of information discussed and she states that she has no further questions at this time related to diabetes.  Thanks, Barnie Alderman, RN, MSN, CDE Diabetes Coordinator Inpatient Diabetes Program [phone] (Team Pager from [place] to [place]) [phone] ([facility] office) [phone] [facility] office) [phone] [facility] office)

## 2015-10-15 ENCOUNTER — Ambulatory Visit: Payer: Medicare Other

## 2015-10-20 ENCOUNTER — Emergency Department
Admission: EM | Admit: 2015-10-20 | Discharge: 2015-10-20 | Disposition: A | Discharge status: Home / Self Care | Payer: Medicare Other | Attending: Emergency Medicine | Admitting: Emergency Medicine

## 2015-10-20 ENCOUNTER — Encounter: Payer: Self-pay | Admitting: Emergency Medicine

## 2015-10-20 DIAGNOSIS — I10 Essential (primary) hypertension: Secondary | ICD-10-CM | POA: Diagnosis not present

## 2015-10-20 DIAGNOSIS — Z5321 Procedure and treatment not carried out due to patient leaving prior to being seen by health care provider: Secondary | ICD-10-CM | POA: Insufficient documentation

## 2015-10-20 DIAGNOSIS — Z7984 Long term (current) use of oral hypoglycemic drugs: Secondary | ICD-10-CM | POA: Diagnosis not present

## 2015-10-20 DIAGNOSIS — E1165 Type 2 diabetes mellitus with hyperglycemia: Secondary | ICD-10-CM | POA: Insufficient documentation

## 2015-10-20 DIAGNOSIS — Z87891 Personal history of nicotine dependence: Secondary | ICD-10-CM | POA: Insufficient documentation

## 2015-10-20 LAB — GLUCOSE, CAPILLARY: Glucose-Capillary: 439 mg/dL — ABNORMAL HIGH (ref 65–99)

## 2015-10-20 NOTE — ED Triage Notes (Signed)
Pt to ed from [facility] with hyperglycemia.  Pt states she was seen at endocrinology on Friday and her CBG was in the 400's but her MD increased her insulin.  Pt reports today feeling dizzy and lightheaded.

## 2015-10-20 NOTE — ED Notes (Signed)
Pt reports that she is not feeling symptomatic and that she wants to go home and take her insulin and see if her blood sugars will go down. Pt states that she didn't really want to be seen in the ED anyway but was made to come by [facility]. Pt in NAD. Advised to come back to ED if she felt worse and if her blood sugars wont decrease at home.

## 2015-10-21 ENCOUNTER — Emergency Department
Admission: EM | Admit: 2015-10-21 | Discharge: 2015-10-21 | Disposition: A | Discharge status: Home / Self Care | Payer: Medicare Other | Attending: Emergency Medicine | Admitting: Emergency Medicine

## 2015-10-21 ENCOUNTER — Emergency Department: Payer: Medicare Other

## 2015-10-21 ENCOUNTER — Encounter: Payer: Self-pay | Admitting: Emergency Medicine

## 2015-10-21 DIAGNOSIS — Z79899 Other long term (current) drug therapy: Secondary | ICD-10-CM | POA: Diagnosis not present

## 2015-10-21 DIAGNOSIS — Z87891 Personal history of nicotine dependence: Secondary | ICD-10-CM | POA: Insufficient documentation

## 2015-10-21 DIAGNOSIS — R739 Hyperglycemia, unspecified: Secondary | ICD-10-CM

## 2015-10-21 DIAGNOSIS — R1084 Generalized abdominal pain: Secondary | ICD-10-CM | POA: Diagnosis not present

## 2015-10-21 DIAGNOSIS — Z794 Long term (current) use of insulin: Secondary | ICD-10-CM | POA: Insufficient documentation

## 2015-10-21 DIAGNOSIS — R109 Unspecified abdominal pain: Secondary | ICD-10-CM | POA: Diagnosis present

## 2015-10-21 DIAGNOSIS — E1165 Type 2 diabetes mellitus with hyperglycemia: Secondary | ICD-10-CM | POA: Diagnosis not present

## 2015-10-21 DIAGNOSIS — R110 Nausea: Secondary | ICD-10-CM | POA: Insufficient documentation

## 2015-10-21 DIAGNOSIS — M549 Dorsalgia, unspecified: Secondary | ICD-10-CM | POA: Diagnosis not present

## 2015-10-21 DIAGNOSIS — I10 Essential (primary) hypertension: Secondary | ICD-10-CM | POA: Insufficient documentation

## 2015-10-21 LAB — COMPREHENSIVE METABOLIC PANEL
ALT: 49 U/L (ref 14–54)
AST: 37 U/L (ref 15–41)
Albumin: 4.2 g/dL (ref 3.5–5.0)
Alkaline Phosphatase: 129 U/L — ABNORMAL HIGH (ref 38–126)
Anion gap: 8 (ref 5–15)
BUN: 10 mg/dL (ref 6–20)
CO2: 24 mmol/L (ref 22–32)
Calcium: 8.6 mg/dL — ABNORMAL LOW (ref 8.9–10.3)
Chloride: 104 mmol/L (ref 101–111)
Creatinine, Ser: 0.6 mg/dL (ref 0.44–1.00)
GFR calc Af Amer: >60 mL/min (ref >60)
GFR calc non Af Amer: >60 mL/min (ref >60)
Glucose, Bld: 263 mg/dL — ABNORMAL HIGH (ref 65–99)
Potassium: 3.2 mmol/L — ABNORMAL LOW (ref 3.5–5.1)
Sodium: 136 mmol/L (ref 135–145)
Total Bilirubin: 2.9 mg/dL — ABNORMAL HIGH (ref 0.3–1.2)
Total Protein: 7 g/dL (ref 6.5–8.1)

## 2015-10-21 LAB — CBC WITH DIFFERENTIAL/PLATELET
Basophils Absolute: 0.1 10*3/uL (ref 0–0.1)
Basophils Relative: 1 %
Eosinophils Absolute: 1.3 10*3/uL — ABNORMAL HIGH (ref 0–0.7)
Eosinophils Relative: 15 %
HCT: 38.8 % (ref 35.0–47.0)
Hemoglobin: 14.1 g/dL (ref 12.0–16.0)
Lymphocytes Relative: 30 %
Lymphs Abs: 2.5 10*3/uL (ref 1.0–3.6)
MCH: 31.9 pg (ref 26.0–34.0)
MCHC: 36.2 g/dL — ABNORMAL HIGH (ref 32.0–36.0)
MCV: 88.1 fL (ref 80.0–100.0)
Monocytes Absolute: 0.3 10*3/uL (ref 0.2–0.9)
Monocytes Relative: 4 %
Neutro Abs: 4.2 10*3/uL (ref 1.4–6.5)
Neutrophils Relative %: 50 %
Platelets: 164 10*3/uL (ref 150–440)
RBC: 4.41 MIL/uL (ref 3.80–5.20)
RDW: 13.8 % (ref 11.5–14.5)
WBC: 8.4 10*3/uL (ref 3.6–11.0)

## 2015-10-21 LAB — LIPASE, BLOOD: Lipase: 16 U/L (ref 11–51)

## 2015-10-21 MED ORDER — OXYCODONE HCL 5 MG PO TABS: 5.0000 mg | ORAL_TABLET | Freq: Three times a day (TID) | ORAL | 0 refills | Status: DC | PRN

## 2015-10-21 MED ORDER — KETOROLAC TROMETHAMINE 60 MG/2ML IM SOLN
60.0000 mg | Freq: Once | INTRAMUSCULAR | Status: AC
  Administered 2015-10-21: 60 mg via INTRAMUSCULAR
  Filled 2015-10-21: qty 2

## 2015-10-21 MED ORDER — PROMETHAZINE HCL 25 MG PO TABS
25.0000 mg | ORAL_TABLET | Freq: Once | ORAL | Status: AC
  Administered 2015-10-21: 25 mg via ORAL
  Filled 2015-10-21: qty 1

## 2015-10-21 MED ORDER — METOCLOPRAMIDE HCL 10 MG PO TABS
10.0000 mg | ORAL_TABLET | Freq: Once | ORAL | Status: AC
  Administered 2015-10-21: 10 mg via ORAL
  Filled 2015-10-21: qty 1

## 2015-10-21 MED ORDER — ONDANSETRON HCL 4 MG PO TABS: 4.0000 mg | ORAL_TABLET | Freq: Every day | ORAL | 1 refills | Status: DC | PRN

## 2015-10-21 NOTE — ED Provider Notes (Signed)
[facility] Emergency Department Provider Note        Time seen: ----------------------------------------- 7:23 AM on 10/21/2015 -----------------------------------------    I have reviewed the triage vital signs and the nursing notes.   HISTORY  Chief Complaint Shortness of Breath    HPI Amber Christensen is a 50 y.o. female who presents to ER for abdominal pain. Patient states she's been sent from [facility] due to abnormal labs last night secondary to hyperglycemia and elevated liver enzymes. Patient did not want to wait to be seen so she left last night prior to being evaluated. She returned this morning due to increased abdominal pain that radiates around to her back. She denies vomiting or diarrhea but does have nausea. Patient states she took her insulin as directed but her sugars were in the 400s.   Past Medical History:  Diagnosis Date  . Diabetes mellitus without complication ([place])   . GERD (gastroesophageal reflux disease)   . Hypertension   . Liver disease   . Pancreatitis     Patient Active Problem List   Diagnosis Date Noted  . Unstable angina ([place]) 09/28/2015    Past Surgical History:  Procedure Laterality Date  . ABDOMINAL HYSTERECTOMY    . APPENDECTOMY    . BOWEL RESECTION    . BREAST BIOPSY Left 1989  . CHOLECYSTECTOMY    . CYST REMOVAL HAND Left 1992   Ganglion cyst removed from Left Wrist  . DILATATION & CURETTAGE/HYSTEROSCOPY WITH MYOSURE    . KNEE ARTHROSCOPY Left 1992  . PANCREAS SURGERY    . REPAIR OF ESOPHAGUS  2012   3 clamps placed in esophagus    Allergies Demerol [meperidine]; Dilaudid [hydromorphone hcl]; Morphine and related; Darvon [propoxyphene]; Flexeril [cyclobenzaprine]; Levaquin [levofloxacin in d5w]; Soma [carisoprodol]; and Zofran Alvis Lemmings hcl]  Social History Social History  Substance Use Topics  . Smoking status: Former Smoker    Types: Cigarettes  . Smokeless tobacco: Former Systems developer  .  Alcohol use No    Review of Systems Constitutional: Negative for fever. Cardiovascular: Negative for chest pain. Respiratory: Negative for shortness of breath. Gastrointestinal: Positive for abdominal pain, nausea Genitourinary: Negative for dysuria. Musculoskeletal: Positive for back pain Skin: Negative for rash. Neurological: Negative for headaches, focal weakness or numbness.  10-point ROS otherwise negative.  ____________________________________________   PHYSICAL EXAM:  VITAL SIGNS: ED Triage Vitals [10/21/15 0605]  Enc Vitals Group     BP (!) 170/113     Pulse Rate (!) 102     Resp (!) 97     Temp 98.1 F (36.7 C)     Temp Source Oral     SpO2 98 %     Weight      Height      Head Circumference      Peak Flow      Pain Score      Pain Loc      Pain Edu?      Excl. in [place]?     Constitutional: Alert and oriented. Well appearing and in no distress. Eyes: Conjunctivae are normal. PERRL. Normal extraocular movements. ENT   Head: Normocephalic and atraumatic.   Nose: No congestion/rhinnorhea.   Mouth/Throat: Mucous membranes are moist.   Neck: No stridor. Cardiovascular: Normal rate, regular rhythm. No murmurs, rubs, or gallops. Respiratory: Normal respiratory effort without tachypnea nor retractions. Breath sounds are clear and equal bilaterally. No wheezes/rales/rhonchi. Gastrointestinal: Diffuse upper abdominal tenderness, no rebound or guarding. Normal bowel sounds. Musculoskeletal: Nontender with  normal range of motion in all extremities. No lower extremity tenderness nor edema. Neurologic:  Normal speech and language. No gross focal neurologic deficits are appreciated.  Skin:  Skin is warm, dry and intact. No rash noted. Psychiatric: Mood and affect are normal. Speech and behavior are normal.  ____________________________________________  EKG: Interpreted by me. Normal sinus rhythm rate 91 bpm, normal PR interval, normal QRS, normal QT  interval. Normal axis.  ____________________________________________  ED COURSE:  Pertinent labs & imaging results that were available during my care of the patient were reviewed by me and considered in my medical decision making (see chart for details). Clinical Course  Patient presents in no acute distress, has a significant history for abdominal pain, liver disease and pancreatitis. She will receive pain medicine, antiemetics and we will reevaluate.  Procedures ____________________________________________   LABS (pertinent positives/negatives)  Labs Reviewed  CBC WITH DIFFERENTIAL/PLATELET - Abnormal; Notable for the following:       Result Value   MCHC 36.2 (*)    Eosinophils Absolute 1.3 (*)    All other components within normal limits  COMPREHENSIVE METABOLIC PANEL - Abnormal; Notable for the following:    Potassium 3.2 (*)    Glucose, Bld 263 (*)    Calcium 8.6 (*)    Alkaline Phosphatase 129 (*)    Total Bilirubin 2.9 (*)    All other components within normal limits  LIPASE, BLOOD  URINALYSIS COMPLETEWITH MICROSCOPIC ([facility] ONLY)    RADIOLOGY Chest x-ray IMPRESSION: No acute cardiopulmonary process seen.    ____________________________________________  FINAL ASSESSMENT AND PLAN  Abdominal pain, hyperglycemia  Plan: Patient with labs and imaging as dictated above. Patient is in no acute distress, unclear etiology for her symptoms. She does have acute on chronic abdominal pain however her LFTs have improved significantly. She has been hyperglycemic, so this could be gastroparesis as well. I will prescribe a short course of pain medication as well as antiemetics and she is advised to call gastroenterology today for follow-up. Patient is agreeable to plan.   Earleen Newport, MD   Note: This dictation was prepared with Dragon dictation. Any transcriptional errors that result from this process are unintentional    Earleen Newport, MD 10/21/15  [phone]

## 2015-10-21 NOTE — ED Triage Notes (Signed)
Pt presents with abd pain. Pt states she had been sent over from [facility] due to abnormal labs last night.(hyperglycemic and elevated liver enzymes) Pt did not want to wait to be seen so left peior to being evaluated. Pt returning this am due to increase in abd pain that radiates around to her back. Denies vomiting or diarrhea.+nausea.

## 2015-10-23 ENCOUNTER — Other Ambulatory Visit: Payer: Self-pay | Admitting: Gastroenterology

## 2015-10-23 DIAGNOSIS — K861 Other chronic pancreatitis: Secondary | ICD-10-CM

## 2015-10-23 DIAGNOSIS — I82890 Acute embolism and thrombosis of other specified veins: Secondary | ICD-10-CM

## 2015-10-23 DIAGNOSIS — R945 Abnormal results of liver function studies: Principal | ICD-10-CM

## 2015-10-23 DIAGNOSIS — R7989 Other specified abnormal findings of blood chemistry: Secondary | ICD-10-CM

## 2015-10-23 DIAGNOSIS — R1012 Left upper quadrant pain: Secondary | ICD-10-CM

## 2015-10-28 ENCOUNTER — Emergency Department: Payer: Medicare Other

## 2015-10-28 ENCOUNTER — Encounter: Payer: Self-pay | Admitting: Emergency Medicine

## 2015-10-28 ENCOUNTER — Emergency Department
Admission: EM | Admit: 2015-10-28 | Discharge: 2015-10-28 | Disposition: A | Discharge status: Home / Self Care | Payer: Medicare Other | Attending: Emergency Medicine | Admitting: Emergency Medicine

## 2015-10-28 DIAGNOSIS — H538 Other visual disturbances: Secondary | ICD-10-CM | POA: Insufficient documentation

## 2015-10-28 DIAGNOSIS — Z87891 Personal history of nicotine dependence: Secondary | ICD-10-CM | POA: Insufficient documentation

## 2015-10-28 DIAGNOSIS — I10 Essential (primary) hypertension: Secondary | ICD-10-CM | POA: Insufficient documentation

## 2015-10-28 DIAGNOSIS — Z794 Long term (current) use of insulin: Secondary | ICD-10-CM | POA: Insufficient documentation

## 2015-10-28 DIAGNOSIS — E119 Type 2 diabetes mellitus without complications: Secondary | ICD-10-CM | POA: Diagnosis not present

## 2015-10-28 DIAGNOSIS — Z79899 Other long term (current) drug therapy: Secondary | ICD-10-CM | POA: Insufficient documentation

## 2015-10-28 DIAGNOSIS — R109 Unspecified abdominal pain: Secondary | ICD-10-CM | POA: Diagnosis present

## 2015-10-28 DIAGNOSIS — I864 Gastric varices: Secondary | ICD-10-CM | POA: Diagnosis not present

## 2015-10-28 LAB — URINE DRUG SCREEN, QUALITATIVE (ARMC ONLY)
Amphetamines, Ur Screen: NOT DETECTED
Barbiturates, Ur Screen: NOT DETECTED
Benzodiazepine, Ur Scrn: NOT DETECTED
Cannabinoid 50 Ng, Ur ~~LOC~~: NOT DETECTED
Cocaine Metabolite,Ur ~~LOC~~: NOT DETECTED
MDMA (Ecstasy)Ur Screen: NOT DETECTED
Methadone Scn, Ur: NOT DETECTED
Opiate, Ur Screen: NOT DETECTED
Phencyclidine (PCP) Ur S: NOT DETECTED
Tricyclic, Ur Screen: NOT DETECTED

## 2015-10-28 LAB — DIFFERENTIAL
Basophils Absolute: 0.1 10*3/uL (ref 0–0.1)
Basophils Relative: 1 %
Eosinophils Absolute: 0.7 10*3/uL (ref 0–0.7)
Eosinophils Relative: 10 %
Lymphocytes Relative: 35 %
Lymphs Abs: 2.6 10*3/uL (ref 1.0–3.6)
Monocytes Absolute: 0.3 10*3/uL (ref 0.2–0.9)
Monocytes Relative: 4 %
Neutro Abs: 3.7 10*3/uL (ref 1.4–6.5)
Neutrophils Relative %: 50 %

## 2015-10-28 LAB — MAGNESIUM: Magnesium: 1.6 mg/dL — ABNORMAL LOW (ref 1.7–2.4)

## 2015-10-28 LAB — CBC
HCT: 41.6 % (ref 35.0–47.0)
Hemoglobin: 14.5 g/dL (ref 12.0–16.0)
MCH: 32 pg (ref 26.0–34.0)
MCHC: 34.8 g/dL (ref 32.0–36.0)
MCV: 92.1 fL (ref 80.0–100.0)
Platelets: 168 10*3/uL (ref 150–440)
RBC: 4.52 MIL/uL (ref 3.80–5.20)
RDW: 13.7 % (ref 11.5–14.5)
WBC: 7 10*3/uL (ref 3.6–11.0)

## 2015-10-28 LAB — LIPASE, BLOOD: Lipase: 14 U/L (ref 11–51)

## 2015-10-28 LAB — URINALYSIS COMPLETE WITH MICROSCOPIC (ARMC ONLY)
Bacteria, UA: NONE SEEN
Bilirubin Urine: NEGATIVE
Glucose, UA: >500 mg/dL — AB
Hgb urine dipstick: NEGATIVE
Ketones, ur: NEGATIVE mg/dL
Leukocytes, UA: NEGATIVE
Nitrite: NEGATIVE
Protein, ur: NEGATIVE mg/dL
RBC / HPF: NONE SEEN RBC/hpf (ref 0–5)
Specific Gravity, Urine: 1.007 (ref 1.005–1.030)
pH: 6 (ref 5.0–8.0)

## 2015-10-28 LAB — BASIC METABOLIC PANEL
Anion gap: 10 (ref 5–15)
BUN: 12 mg/dL (ref 6–20)
CO2: 25 mmol/L (ref 22–32)
Calcium: 8.6 mg/dL — ABNORMAL LOW (ref 8.9–10.3)
Chloride: 102 mmol/L (ref 101–111)
Creatinine, Ser: 0.64 mg/dL (ref 0.44–1.00)
GFR calc Af Amer: >60 mL/min (ref >60)
GFR calc non Af Amer: >60 mL/min (ref >60)
Glucose, Bld: 278 mg/dL — ABNORMAL HIGH (ref 65–99)
Potassium: 3.5 mmol/L (ref 3.5–5.1)
Sodium: 137 mmol/L (ref 135–145)

## 2015-10-28 LAB — HEPATIC FUNCTION PANEL
ALT: 42 U/L (ref 14–54)
AST: 31 U/L (ref 15–41)
Albumin: 4.8 g/dL (ref 3.5–5.0)
Alkaline Phosphatase: 146 U/L — ABNORMAL HIGH (ref 38–126)
Bilirubin, Direct: 0.3 mg/dL (ref 0.1–0.5)
Indirect Bilirubin: 4 mg/dL — ABNORMAL HIGH (ref 0.3–0.9)
Total Bilirubin: 4.3 mg/dL — ABNORMAL HIGH (ref 0.3–1.2)
Total Protein: 7.9 g/dL (ref 6.5–8.1)

## 2015-10-28 LAB — TROPONIN I: Troponin I: <0.03 ng/mL (ref <0.03)

## 2015-10-28 LAB — GLUCOSE, CAPILLARY: Glucose-Capillary: 230 mg/dL — ABNORMAL HIGH (ref 65–99)

## 2015-10-28 LAB — PREGNANCY, URINE: Preg Test, Ur: NEGATIVE

## 2015-10-28 MED ORDER — GADOBENATE DIMEGLUMINE 529 MG/ML IV SOLN
15.0000 mL | Freq: Once | INTRAVENOUS | Status: AC | PRN
  Administered 2015-10-28: 12 mL via INTRAVENOUS

## 2015-10-28 MED ORDER — IOPAMIDOL (ISOVUE-300) INJECTION 61%
100.0000 mL | Freq: Once | INTRAVENOUS | Status: AC | PRN
  Administered 2015-10-28: 100 mL via INTRAVENOUS

## 2015-10-28 MED ORDER — IOPAMIDOL (ISOVUE-300) INJECTION 61%
30.0000 mL | Freq: Once | INTRAVENOUS | Status: AC | PRN
  Administered 2015-10-28: 30 mL via ORAL

## 2015-10-28 MED ORDER — PROMETHAZINE HCL 25 MG/ML IJ SOLN
25.0000 mg | Freq: Once | INTRAMUSCULAR | Status: AC
  Administered 2015-10-28: 25 mg via INTRAVENOUS

## 2015-10-28 MED ORDER — ONDANSETRON HCL 4 MG/2ML IJ SOLN
INTRAMUSCULAR | Status: AC
  Filled 2015-10-28: qty 2

## 2015-10-28 MED ORDER — PROMETHAZINE HCL 25 MG/ML IJ SOLN
INTRAMUSCULAR | Status: AC
  Administered 2015-10-28: 25 mg via INTRAVENOUS
  Filled 2015-10-28: qty 1

## 2015-10-28 MED ORDER — ONDANSETRON HCL 4 MG/2ML IJ SOLN: 4.0000 mg | Freq: Once | INTRAMUSCULAR | Status: DC

## 2015-10-28 NOTE — ED Notes (Signed)
Patient transported to CT 

## 2015-10-28 NOTE — ED Provider Notes (Signed)
[facility] Emergency Department Provider Note   ____________________________________________   First MD Initiated Contact with Patient 10/28/15 1745     (approximate)  I have reviewed the triage vital signs and the nursing notes.   HISTORY  Chief Complaint Dizziness; Blurred Vision; and Flank Pain    HPI Amber Christensen is a 50 y.o. female patient reports she has a long and involved medical history including ruptured esophagus structure ruptured, and pancreatic or bile duct pancreatic problems including resection with only 20% of pancreatic function left adhesions etc. Patient comes in complaining of blood pressure going from 80 systolic to 119 systolic and back down again patient's had her medicines adjusted including clonidine day or 2 ago with a subsequent raise and her blood pressure from 80-200. Patient reports she gets pale and clammy and sometimes flushed and clammy and sweaty and feels very ill. Patient has pain in her left side. This is where she's had her pancreatic surgery she has a very large scar along her left flank there. He also had lysis of the T-sheet's after that. Patient reports her vision has become blurry intermittently with some diffuse weakness and dizziness. Patient's having a lot of difficulty regulating her blood sugar which is going up and down. Patient is seeing her doctor frequently but is unable to address these issues to her satisfaction. Patient said her father had a history of recent blood pressure going up and down like hers and finally ended up having bilateral carotid artery disease. It was felt that his carotid artery disease was the result was the cause of his blood pressure going up and down. Patient's symptoms have been getting worse in the last week or so.  Past Medical History:  Diagnosis Date  . Diabetes mellitus without complication ([place])   . GERD (gastroesophageal reflux disease)   . Hypertension   . Liver disease   .  Pancreatitis     Patient Active Problem List   Diagnosis Date Noted  . Unstable angina ([place]) 09/28/2015    Past Surgical History:  Procedure Laterality Date  . ABDOMINAL HYSTERECTOMY    . APPENDECTOMY    . BOWEL RESECTION    . BREAST BIOPSY Left 1989  . CHOLECYSTECTOMY    . CYST REMOVAL HAND Left 1992   Ganglion cyst removed from Left Wrist  . DILATATION & CURETTAGE/HYSTEROSCOPY WITH MYOSURE    . KNEE ARTHROSCOPY Left 1992  . PANCREAS SURGERY    . REPAIR OF ESOPHAGUS  2012   3 clamps placed in esophagus    Prior to Admission medications   Medication Sig Start Date End Date Taking? Authorizing Provider  amLODipine (NORVASC) 5 MG tablet Take 1 tablet (5 mg total) by mouth daily. 09/29/15   Henreitta Leber, MD  cloNIDine (CATAPRES) 0.1 MG tablet Take 1 tablet (0.1 mg total) by mouth 2 (two) times daily. Patient not taking: Reported on 10/28/2015 09/29/15   Henreitta Leber, MD  insulin glargine (LANTUS) 100 UNIT/ML injection Inject 0.12 mLs (12 Units total) into the skin at bedtime. 09/29/15   Henreitta Leber, MD  insulin lispro (HUMALOG) 100 UNIT/ML injection Inject 0.04 mLs (4 Units total) into the skin 3 (three) times daily with meals. Pt uses based off the amount of carbs she eats at each meal. 09/29/15   Henreitta Leber, MD  lipase/protease/amylase (CREON) 12000 units CPEP capsule Take 1 capsule (12,000 Units total) by mouth 4 (four) times daily -  with meals and at bedtime. 09/29/15  Henreitta Leber, MD  lisinopril (PRINIVIL,ZESTRIL) 20 MG tablet Take 1 tablet (20 mg total) by mouth daily. 09/29/15   Henreitta Leber, MD  ondansetron (ZOFRAN) 4 MG tablet Take 1 tablet (4 mg total) by mouth daily as needed for nausea or vomiting. 10/21/15   Earleen Newport, MD  oxyCODONE (ROXICODONE) 5 MG immediate release tablet Take 1 tablet (5 mg total) by mouth every 8 (eight) hours as needed. 10/21/15 10/20/16  Earleen Newport, MD  pantoprazole (PROTONIX) 40 MG tablet Take 1 tablet (40 mg total)  by mouth 2 (two) times daily. 09/29/15   Henreitta Leber, MD  vitamin E 400 UNIT capsule Take 400 Units by mouth daily.    Historical Provider, MD    Allergies Demerol [meperidine]; Dilaudid [hydromorphone hcl]; Morphine and related; Darvon [propoxyphene]; Flexeril [cyclobenzaprine]; Levaquin [levofloxacin in d5w]; Soma [carisoprodol]; and Zofran [ondansetron hcl]  No family history on file.  Social History Social History  Substance Use Topics  . Smoking status: Former Smoker    Types: Cigarettes  . Smokeless tobacco: Former Systems developer  . Alcohol use No    Review of Systems Constitutional: No fever/chills Eyes:See history of present illness. ENT: No sore throat. Cardiovascular: Denies chest pain. Respiratory: Denies shortness of breath. Gastrointestinal: See history of present illness. Genitourinary: Negative for dysuria. Musculoskeletal: Negative for back pain. Skin: Negative for rash.  10-point ROS otherwise negative.  ____________________________________________   PHYSICAL EXAM:  VITAL SIGNS: ED Triage Vitals  Enc Vitals Group     BP 10/28/15 1615 (!) 164/98     Pulse Rate 10/28/15 1615 85     Resp 10/28/15 1615 18     Temp 10/28/15 1615 98.4 F (36.9 C)     Temp Source 10/28/15 1615 Oral     SpO2 10/28/15 1615 100 %     Weight 10/28/15 1615 135 lb (61.2 kg)     Height 10/28/15 1615 5' 4"  (1.626 m)     Head Circumference --      Peak Flow --      Pain Score 10/28/15 1621 7     Pain Loc --      Pain Edu? --      Excl. in [place]? --     Constitutional: Alert and oriented. Well appearing and in no acute distress. Eyes: Conjunctivae are normal. PERRL. EOMI. Head: Atraumatic. Nose: No congestion/rhinnorhea. Mouth/Throat: Mucous membranes are moist.  Oropharynx non-erythematous. Neck: No stridor.   Cardiovascular: Normal rate, regular rhythm. Grossly normal heart sounds.  Good peripheral circulation. Respiratory: Normal respiratory effort.  No retractions. Lungs  CTAB. Gastrointestinal:Patient with large scar on the lateral side of her abdomen. She also has a small ventral hernia just above the umbilicus which does not have anything in it and otherwise has been reduced. Patient's abdomen is soft mildly diffusely tender. The bowel sounds are positive. I do not feel any organomegaly. Musculoskeletal: No lower extremity tenderness nor edema.  No joint effusions. Neurologic:  Normal speech and language. Cranial nerves II through XII are intact. Cerebellar finger-nose rapid alternating movements in the hands are normal. Motor strength is 5 over 5 throughout sensation is intact at present. Skin:  Skin is warm, dry and intact. No rash noted. Psychiatric: Mood and affect are normal. Speech and behavior are normal.  ____________________________________________   LABS (all labs ordered are listed, but only abnormal results are displayed)  Labs Reviewed  BASIC METABOLIC PANEL - Abnormal; Notable for the following:  Result Value   Glucose, Bld 278 (*)    Calcium 8.6 (*)    All other components within normal limits  URINALYSIS COMPLETEWITH MICROSCOPIC ([facility] ONLY) - Abnormal; Notable for the following:    Color, Urine STRAW (*)    APPearance CLEAR (*)    Glucose, UA >500 (*)    Squamous Epithelial / LPF 0-5 (*)    All other components within normal limits  HEPATIC FUNCTION PANEL - Abnormal; Notable for the following:    Alkaline Phosphatase 146 (*)    Total Bilirubin 4.3 (*)    Indirect Bilirubin 4.0 (*)    All other components within normal limits  MAGNESIUM - Abnormal; Notable for the following:    Magnesium 1.6 (*)    All other components within normal limits  GLUCOSE, CAPILLARY - Abnormal; Notable for the following:    Glucose-Capillary 230 (*)    All other components within normal limits  CBC  LIPASE, BLOOD  PREGNANCY, URINE  URINE DRUG SCREEN, QUALITATIVE ([facility] ONLY)  TROPONIN I  DIFFERENTIAL  CBC WITH DIFFERENTIAL/PLATELET  CBG  MONITORING, ED   ____________________________________________  EKG  EKG read and interpreted by me shows normal sinus rhythm rate of 93 normal axis no acute ST-T wave changes ____________________________________________  RADIOLOGY  CLINICAL DATA:  Weakness with shortness of breath and left upper quadrant pain today.  EXAM: CHEST  2 VIEW  COMPARISON:  10/21/2015  FINDINGS: The heart size and mediastinal contours are within normal limits. Both lungs are clear. The visualized skeletal structures are unremarkable.  IMPRESSION: No active cardiopulmonary disease.   Electronically Signed   By: Misty Stanley M.D.   On: 10/28/2015 19:14 ______________ ___CLINICAL DATA:  Intermittent blurry vision. Dizziness. Hypertension and diabetes.  EXAM: MRA NECK WITHOUT AND WITH CONTRAST  TECHNIQUE: Multiplanar and multiecho pulse sequences of the neck were obtained without and with intravenous contrast. Angiographic images of the neck were obtained using MRA technique without and with intravenous contrast.  CONTRAST:  72m MULTIHANCE GADOBENATE DIMEGLUMINE 529 MG/ML IV SOLN  COMPARISON:  None.  FINDINGS: Three vessel aortic arch. Brachiocephalic and subclavian arteries are widely patent. Cervical carotid arteries are widely patent without evidence of stenosis or dissection. Vertebral arteries are patent with antegrade flow bilaterally, with the left being slightly larger than the right. No significant vertebral artery stenosis is identified, however the distal V3 segments were incompletely imaged on both the noncontrast time-of-flight and postcontrast MRA sequences.  IMPRESSION: Negative neck MRA.  Distal vertebral arteries incompletely imaged.   Electronically Signed   By: ALogan BoresM.D.   On: 10/28/2015 21:12 ___TECHNIQUE: Multiplanar, multiecho pulse sequences of the brain and surrounding structures were obtained without and with intravenous  contrast.  CONTRAST:  167mMULTIHANCE GADOBENATE DIMEGLUMINE 529 MG/ML IV SOLN  COMPARISON:  None.  FINDINGS: Brain: There is no evidence of acute infarct, intracranial hemorrhage, mass, midline shift, or extra-axial fluid collection. The ventricles and sulci are normal. Small foci of T2 hyperintensity are present in the subcortical and deep cerebral white matter bilaterally, moderately advanced for age and nonspecific but compatible with chronic small vessel ischemic disease in this patient with diabetes and hypertension. No abnormal enhancement is identified.  Vascular: Major intracranial vascular flow voids are preserved.  Skull: Unremarkable bone marrow signal.  Sinuses/Orbits: Unremarkable.  Other: None.  IMPRESSION: 1. No acute intracranial abnormality. 2. Chronic small vessel ischemic disease, moderately advanced for age.   Electronically Signed   By: AlLogan Bores.D.   On: 10/28/2015  21:08________________________   PROCEDURES  Procedure(s) performed:  Procedures  Critical Care performed:   ____________________________________________   INITIAL IMPRESSION / ASSESSMENT AND PLAN / ED COURSE  Pertinent labs & imaging results that were available during my care of the patient were reviewed by me and considered in my medical decision making (see chart for details). Studies we have done today are essentially normal with the exception of the varices that were found on the CT. I will have the patient follow-up with her regular family doctor and then with gastroenterology afterwards I will notify her the need to return to the emergency room immediately if she develops any bleeding or lightheadedness. Her doctor may want to evaluate her for the [facility] cytoma of her blood pressure is as unstable as it sounds.   Clinical Course     ____________________________________________   FINAL CLINICAL IMPRESSION(S) / ED DIAGNOSES  Final diagnoses:  Gastric  varices      NEW MEDICATIONS STARTED DURING THIS VISIT:  New Prescriptions   No medications on file     Note:  This document was prepared using Dragon voice recognition software and may include unintentional dictation errors.    Nena Polio, MD 10/28/15 [phone]

## 2015-10-28 NOTE — ED Notes (Signed)
Radiology called and informed that the patient can go ahead to CT without drinking the second bottle per verbal order from Dr. Cinda Quest.

## 2015-10-28 NOTE — Discharge Instructions (Signed)
Please follow-up with your family doctor. Have her review the CAT scan which shows the varices in your stomach. These are thin walled blood vessels which can bleed easily. He isn't very important that if she began having any vomiting of blood or blood in the stool that she return to the emergency room immediately. Your doctor should also keep an eye sugar and your blood pressure. She may want to evaluate you for pheochromocytoma.

## 2015-10-28 NOTE — ED Notes (Signed)
Patient transported to radiology

## 2015-10-28 NOTE — ED Notes (Signed)
Pt e-signed, pad did not transfer signature over to computer. Pt verbalized understanding of D/C papers and had no further questions.

## 2015-10-28 NOTE — ED Notes (Signed)
Pt returned from MRI °

## 2015-10-28 NOTE — ED Triage Notes (Addendum)
Pt seen here recently for low BP, dizziness and blurred vision. Pt was taken off clonidine 2 days ago and pt reports now BP is high. Pt reports continued blurred vision "haze". Pt also reports left sided pain, reports hx of pancreatic surgery. Pt with family hx of carotid occlusions.

## 2015-11-03 ENCOUNTER — Ambulatory Visit
Admission: RE | Admit: 2015-11-03 | Discharge: 2015-11-03 | Disposition: A | Discharge status: Home / Self Care | Payer: Medicare Other | Source: Ambulatory Visit | Attending: Gastroenterology | Admitting: Gastroenterology

## 2015-11-03 DIAGNOSIS — K861 Other chronic pancreatitis: Secondary | ICD-10-CM | POA: Insufficient documentation

## 2015-11-03 DIAGNOSIS — R945 Abnormal results of liver function studies: Principal | ICD-10-CM

## 2015-11-03 DIAGNOSIS — K7689 Other specified diseases of liver: Secondary | ICD-10-CM | POA: Insufficient documentation

## 2015-11-03 DIAGNOSIS — R1012 Left upper quadrant pain: Secondary | ICD-10-CM | POA: Insufficient documentation

## 2015-11-03 DIAGNOSIS — K219 Gastro-esophageal reflux disease without esophagitis: Secondary | ICD-10-CM | POA: Diagnosis not present

## 2015-11-03 DIAGNOSIS — I82890 Acute embolism and thrombosis of other specified veins: Secondary | ICD-10-CM | POA: Diagnosis not present

## 2015-11-03 DIAGNOSIS — R7989 Other specified abnormal findings of blood chemistry: Secondary | ICD-10-CM

## 2015-11-03 DIAGNOSIS — K760 Fatty (change of) liver, not elsewhere classified: Secondary | ICD-10-CM | POA: Diagnosis not present

## 2015-11-23 ENCOUNTER — Ambulatory Visit: Payer: Medicare Other | Admitting: Pharmacist

## 2015-11-23 DIAGNOSIS — Z79899 Other long term (current) drug therapy: Secondary | ICD-10-CM

## 2015-11-23 NOTE — Progress Notes (Signed)
Mrs. Hilmer is here today to review Medicare Part D/Advantage plans.  Patient's current medication list was reviewed and all of her medications can be obtained through a Medicare Part D/Advantage plan. Patient will be eligible to sign up for a Medicare Part D/Advantage plan during enrollment period (Oct 15 - Dec 7); plan would start on 02/15/2016. Medicare.gov was used to review the available Part D/Advantage plans. Patient does not qualify for subsidy. Patient has not chosen a plan, but will review options presented today which include Medicare Advantage Plans such as [employer]etna with 0 monthly premium and vision options. Patient was instructed to call [facility] healthcare providers to make sure they accept the [place]; [facility] appears to accept the [place] plans.  Current Outpatient Prescriptions  Medication Sig Dispense Refill  . amLODipine (NORVASC) 5 MG tablet Take 1 tablet (5 mg total) by mouth daily. 60 tablet 1  . insulin glargine (LANTUS) 100 UNIT/ML injection Inject 0.12 mLs (12 Units total) into the skin at bedtime. 10 mL 11  . insulin lispro (HUMALOG) 100 UNIT/ML injection Inject 0.04 mLs (4 Units total) into the skin 3 (three) times daily with meals. Pt uses based off the amount of carbs she eats at each meal. 10 mL 11  . lipase/protease/amylase (CREON) 12000 units CPEP capsule Take 1 capsule (12,000 Units total) by mouth 4 (four) times daily -  with meals and at bedtime. 270 capsule 0  . lisinopril (PRINIVIL,ZESTRIL) 20 MG tablet Take 1 tablet (20 mg total) by mouth daily. 60 tablet 1  . pantoprazole (PROTONIX) 40 MG tablet Take 1 tablet (40 mg total) by mouth 2 (two) times daily. 60 tablet 1  . cloNIDine (CATAPRES) 0.1 MG tablet Take 1 tablet (0.1 mg total) by mouth 2 (two) times daily. (Patient not taking: Reported on 11/23/2015) 60 tablet 11  . ondansetron (ZOFRAN) 4 MG tablet Take 1 tablet (4 mg total) by mouth daily as needed for nausea or vomiting. 20 tablet 1  . oxyCODONE  (ROXICODONE) 5 MG immediate release tablet Take 1 tablet (5 mg total) by mouth every 8 (eight) hours as needed. 20 tablet 0  . vitamin E 400 UNIT capsule Take 400 Units by mouth daily.     No current facility-administered medications for this visit.     Judith Campillo K. Dicky Doe, PharmD [facility] [phone]

## 2015-12-10 ENCOUNTER — Emergency Department
Admission: EM | Admit: 2015-12-10 | Discharge: 2015-12-10 | Disposition: A | Discharge status: Home / Self Care | Payer: Medicare Other | Attending: Emergency Medicine | Admitting: Emergency Medicine

## 2015-12-10 ENCOUNTER — Encounter: Payer: Self-pay | Admitting: Emergency Medicine

## 2015-12-10 DIAGNOSIS — E119 Type 2 diabetes mellitus without complications: Secondary | ICD-10-CM | POA: Insufficient documentation

## 2015-12-10 DIAGNOSIS — Z79899 Other long term (current) drug therapy: Secondary | ICD-10-CM | POA: Diagnosis not present

## 2015-12-10 DIAGNOSIS — I10 Essential (primary) hypertension: Secondary | ICD-10-CM | POA: Insufficient documentation

## 2015-12-10 DIAGNOSIS — Z87891 Personal history of nicotine dependence: Secondary | ICD-10-CM | POA: Diagnosis not present

## 2015-12-10 DIAGNOSIS — Z7982 Long term (current) use of aspirin: Secondary | ICD-10-CM | POA: Insufficient documentation

## 2015-12-10 DIAGNOSIS — Z794 Long term (current) use of insulin: Secondary | ICD-10-CM | POA: Diagnosis not present

## 2015-12-10 DIAGNOSIS — R52 Pain, unspecified: Secondary | ICD-10-CM | POA: Diagnosis present

## 2015-12-10 LAB — BASIC METABOLIC PANEL
Anion gap: 7 (ref 5–15)
BUN: 14 mg/dL (ref 6–20)
CO2: 26 mmol/L (ref 22–32)
Calcium: 8.5 mg/dL — ABNORMAL LOW (ref 8.9–10.3)
Chloride: 102 mmol/L (ref 101–111)
Creatinine, Ser: 0.62 mg/dL (ref 0.44–1.00)
GFR calc Af Amer: >60 mL/min (ref >60)
GFR calc non Af Amer: >60 mL/min (ref >60)
Glucose, Bld: 242 mg/dL — ABNORMAL HIGH (ref 65–99)
Potassium: 3.4 mmol/L — ABNORMAL LOW (ref 3.5–5.1)
Sodium: 135 mmol/L (ref 135–145)

## 2015-12-10 LAB — MAGNESIUM: Magnesium: 1.3 mg/dL — ABNORMAL LOW (ref 1.7–2.4)

## 2015-12-10 LAB — CBC WITH DIFFERENTIAL/PLATELET
Basophils Absolute: 0 10*3/uL (ref 0–0.1)
Basophils Relative: 1 %
Eosinophils Absolute: 0.4 10*3/uL (ref 0–0.7)
Eosinophils Relative: 7 %
HCT: 38.5 % (ref 35.0–47.0)
Hemoglobin: 13.6 g/dL (ref 12.0–16.0)
Lymphocytes Relative: 33 %
Lymphs Abs: 2.2 10*3/uL (ref 1.0–3.6)
MCH: 31.6 pg (ref 26.0–34.0)
MCHC: 35.3 g/dL (ref 32.0–36.0)
MCV: 89.4 fL (ref 80.0–100.0)
Monocytes Absolute: 0.4 10*3/uL (ref 0.2–0.9)
Monocytes Relative: 6 %
Neutro Abs: 3.5 10*3/uL (ref 1.4–6.5)
Neutrophils Relative %: 53 %
Platelets: 225 10*3/uL (ref 150–440)
RBC: 4.3 MIL/uL (ref 3.80–5.20)
RDW: 13.4 % (ref 11.5–14.5)
WBC: 6.7 10*3/uL (ref 3.6–11.0)

## 2015-12-10 MED ORDER — MAGNESIUM SULFATE 2 GM/50ML IV SOLN
2.0000 g | Freq: Once | INTRAVENOUS | Status: AC
  Administered 2015-12-10: 2 g via INTRAVENOUS
  Filled 2015-12-10: qty 50

## 2015-12-10 MED ORDER — GABAPENTIN 300 MG PO CAPS: 300.0000 mg | ORAL_CAPSULE | Freq: Three times a day (TID) | ORAL | 0 refills | Status: DC

## 2015-12-10 NOTE — ED Provider Notes (Signed)
[facility] Emergency Department Provider Note  ____________________________________________  Time seen: Approximately 8:15 AM  I have reviewed the triage vital signs and the nursing notes.   HISTORY  Chief Complaint Generalized Body Aches    HPI Amber Christensen is a 50 y.o. female who complains of generalized body aches and cramping yesterday. She first noticed it in her bilateral lower extremities, but also now feels it in her back and shoulders primarily. Has had problems like this before, has been diagnosed with low magnesium for which she takes a magnesium supplement daily. Follows up with primary care and endocrinology and obtains most of her medications from the medication management clinic.  Has been told by her doctor that these pains and cramps could be from neuropathy but she does not currently take anything for neuropathy or chronic pain. No diagnosis of fibromyalgia.     Past Medical History:  Diagnosis Date  . Diabetes mellitus without complication ([place])   . GERD (gastroesophageal reflux disease)   . Hypertension   . Liver disease   . Pancreatitis      Patient Active Problem List   Diagnosis Date Noted  . Unstable angina ([place]) 09/28/2015     Past Surgical History:  Procedure Laterality Date  . ABDOMINAL HYSTERECTOMY    . APPENDECTOMY    . BOWEL RESECTION    . BREAST BIOPSY Left 1989  . CHOLECYSTECTOMY    . CYST REMOVAL HAND Left 1992   Ganglion cyst removed from Left Wrist  . DILATATION & CURETTAGE/HYSTEROSCOPY WITH MYOSURE    . KNEE ARTHROSCOPY Left 1992  . PANCREAS SURGERY    . REPAIR OF ESOPHAGUS  2012   3 clamps placed in esophagus     Prior to Admission medications   Medication Sig Start Date End Date Taking? Authorizing Provider  amLODipine (NORVASC) 5 MG tablet Take 1 tablet (5 mg total) by mouth daily. 09/29/15  Yes Henreitta Leber, MD  aspirin EC 81 MG tablet Take 81 mg by mouth daily.   Yes Historical Provider, MD   insulin glargine (LANTUS) 100 UNIT/ML injection Inject 0.12 mLs (12 Units total) into the skin at bedtime. 09/29/15  Yes Henreitta Leber, MD  insulin lispro (HUMALOG) 100 UNIT/ML injection Inject 0.04 mLs (4 Units total) into the skin 3 (three) times daily with meals. Pt uses based off the amount of carbs she eats at each meal. 09/29/15  Yes Henreitta Leber, MD  lipase/protease/amylase (CREON) 12000 units CPEP capsule Take 1 capsule (12,000 Units total) by mouth 4 (four) times daily -  with meals and at bedtime. 09/29/15  Yes Henreitta Leber, MD  lisinopril (PRINIVIL,ZESTRIL) 20 MG tablet Take 1 tablet (20 mg total) by mouth daily. 09/29/15  Yes Henreitta Leber, MD  Magnesium Oxide 500 MG TABS Take 1 tablet by mouth daily.   Yes Historical Provider, MD  ondansetron (ZOFRAN) 4 MG tablet Take 1 tablet (4 mg total) by mouth daily as needed for nausea or vomiting. 10/21/15  Yes Earleen Newport, MD  oxyCODONE (ROXICODONE) 5 MG immediate release tablet Take 1 tablet (5 mg total) by mouth every 8 (eight) hours as needed. 10/21/15 10/20/16 Yes Earleen Newport, MD  pantoprazole (PROTONIX) 40 MG tablet Take 1 tablet (40 mg total) by mouth 2 (two) times daily. 09/29/15  Yes Henreitta Leber, MD  vitamin E 100 UNIT capsule Take 100 Units by mouth daily.   Yes Historical Provider, MD  cloNIDine (CATAPRES) 0.1 MG tablet Take  1 tablet (0.1 mg total) by mouth 2 (two) times daily. Patient not taking: Reported on 11/23/2015 09/29/15   Henreitta Leber, MD  gabapentin (NEURONTIN) 300 MG capsule Take 1 capsule (300 mg total) by mouth 3 (three) times daily. 12/10/15 12/09/16  Carrie Mew, MD  vitamin E 400 UNIT capsule Take 400 Units by mouth daily.    Historical Provider, MD     Allergies Demerol [meperidine]; Dilaudid [hydromorphone hcl]; Morphine and related; Darvon [propoxyphene]; Flexeril [cyclobenzaprine]; Levaquin [levofloxacin in d5w]; Soma [carisoprodol]; and Zofran [ondansetron hcl]   History reviewed. No  pertinent family history.  Social History Social History  Substance Use Topics  . Smoking status: Former Smoker    Types: Cigarettes  . Smokeless tobacco: Former Systems developer  . Alcohol use No    Review of Systems  Constitutional:   No fever or chills.  ENT:   No sore throat. No rhinorrhea. Cardiovascular:   No chest pain. Respiratory:   No dyspnea or cough. Gastrointestinal:   Negative for abdominal pain, vomiting and diarrhea.  Genitourinary:   Negative for dysuria or difficulty urinating. Musculoskeletal:   Negative for focal pain or swelling. Diffuse muscle aches and cramping in the large muscle groups. 10-point ROS otherwise negative.  ____________________________________________   PHYSICAL EXAM:  VITAL SIGNS: ED Triage Vitals  Enc Vitals Group     BP 12/10/15 0558 (!) 147/95     Pulse Rate 12/10/15 0558 91     Resp 12/10/15 0558 18     Temp 12/10/15 0558 97.8 F (36.6 C)     Temp src --      SpO2 12/10/15 0558 97 %     Weight 12/10/15 0553 133 lb (60.3 kg)     Height 12/10/15 0553 5' 4"  (1.626 m)     Head Circumference --      Peak Flow --      Pain Score 12/10/15 0553 9     Pain Loc --      Pain Edu? --      Excl. in [place]? --     Vital signs reviewed, nursing assessments reviewed.   Constitutional:   Alert and oriented. Well appearing and in no distress. Eyes:   No scleral icterus. No conjunctival pallor. PERRL. EOMI.  No nystagmus. ENT   Head:   Normocephalic and atraumatic.   Nose:   No congestion/rhinnorhea. No septal hematoma   Mouth/Throat:   MMM, no pharyngeal erythema. No peritonsillar mass.    Neck:   No stridor. No SubQ emphysema. No meningismus. Hematological/Lymphatic/Immunilogical:   No cervical lymphadenopathy. Cardiovascular:   RRR. Symmetric bilateral radial and DP pulses.  No murmurs.  Respiratory:   Normal respiratory effort without tachypnea nor retractions. Breath sounds are clear and equal bilaterally. No  wheezes/rales/rhonchi. Gastrointestinal:   Soft and nontender. Non distended. There is no CVA tenderness.  No rebound, rigidity, or guarding. Genitourinary:   deferred Musculoskeletal:   Nontender with normal range of motion in all extremities. No joint effusions.  No lower extremity tenderness.  No edema. Neurologic:   Normal speech and language.  CN 2-10 normal. Tone normal. Reflexes normal. Motor grossly intact. No gross focal neurologic deficits are appreciated.  Skin:    Skin is warm, dry and intact. No rash noted.  No petechiae, purpura, or bullae.  ____________________________________________    LABS (pertinent positives/negatives) (all labs ordered are listed, but only abnormal results are displayed) Labs Reviewed  BASIC METABOLIC PANEL - Abnormal; Notable for the following:  Result Value   Potassium 3.4 (*)    Glucose, Bld 242 (*)    Calcium 8.5 (*)    All other components within normal limits  MAGNESIUM - Abnormal; Notable for the following:    Magnesium 1.3 (*)    All other components within normal limits  CBC WITH DIFFERENTIAL/PLATELET   ____________________________________________   EKG    ____________________________________________    RADIOLOGY    ____________________________________________   PROCEDURES Procedures  ____________________________________________   INITIAL IMPRESSION / ASSESSMENT AND PLAN / ED COURSE  Pertinent labs & imaging results that were available during my care of the patient were reviewed by me and considered in my medical decision making (see chart for details).  Patient presents with muscle aches and cramping, vital signs and exam unremarkable. No evidence of DVT or soft tissue infection. Good perfusion everywhere. No focal findings. Labs unremarkable except for mildly low magnesium level despite her twice-daily oral magnesium supplement. Given the symptoms, we will replete with IV magnesium as a therapeutic trial.  More likely I think this is related to her diabetes and neuropathy, and I'll start her on a low dose of gabapentin pending outpatient follow-up. I suspect the low magnesium is chronic and baseline for her, possibly due to her chronic disease state with insulin-dependent diabetes for this regulation of her adrenal axis. Her presentation is not consistent with an adrenal crisis.     Clinical Course   ____________________________________________   FINAL CLINICAL IMPRESSION(S) / ED DIAGNOSES  Final diagnoses:  Hypomagnesemia  Body aches       Portions of this note were generated with dragon dictation software. Dictation errors may occur despite best attempts at proofreading.    Carrie Mew, MD 12/10/15 [phone]

## 2015-12-10 NOTE — ED Notes (Signed)
Pt ambulatory to room, though slowly, c/o cramping pain from legs, and now entire body cramping.  Cramping started last night.  Pt NAD at this time.

## 2015-12-10 NOTE — ED Triage Notes (Signed)
Patient ambulatory to triage with steady gait, without difficulty or distress noted; pt reports "my whole body is hurting and my doctor told me it could be my potassium or my sugar"

## 2016-02-18 DIAGNOSIS — N939 Abnormal uterine and vaginal bleeding, unspecified: Secondary | ICD-10-CM | POA: Insufficient documentation

## 2016-02-24 ENCOUNTER — Emergency Department: Payer: Medicare Other

## 2016-02-24 ENCOUNTER — Emergency Department
Admission: EM | Admit: 2016-02-24 | Discharge: 2016-02-24 | Disposition: A | Discharge status: Home / Self Care | Payer: Medicare Other | Attending: Student in an Organized Health Care Education/Training Program | Admitting: Student in an Organized Health Care Education/Training Program

## 2016-02-24 ENCOUNTER — Encounter: Payer: Self-pay | Admitting: *Deleted

## 2016-02-24 DIAGNOSIS — R1013 Epigastric pain: Secondary | ICD-10-CM | POA: Insufficient documentation

## 2016-02-24 DIAGNOSIS — R079 Chest pain, unspecified: Secondary | ICD-10-CM

## 2016-02-24 DIAGNOSIS — Z87891 Personal history of nicotine dependence: Secondary | ICD-10-CM | POA: Diagnosis not present

## 2016-02-24 DIAGNOSIS — R0789 Other chest pain: Secondary | ICD-10-CM | POA: Diagnosis present

## 2016-02-24 DIAGNOSIS — I10 Essential (primary) hypertension: Secondary | ICD-10-CM | POA: Diagnosis not present

## 2016-02-24 DIAGNOSIS — Z7982 Long term (current) use of aspirin: Secondary | ICD-10-CM | POA: Insufficient documentation

## 2016-02-24 DIAGNOSIS — E119 Type 2 diabetes mellitus without complications: Secondary | ICD-10-CM | POA: Diagnosis not present

## 2016-02-24 DIAGNOSIS — Z794 Long term (current) use of insulin: Secondary | ICD-10-CM | POA: Diagnosis not present

## 2016-02-24 DIAGNOSIS — Z79899 Other long term (current) drug therapy: Secondary | ICD-10-CM | POA: Insufficient documentation

## 2016-02-24 LAB — BASIC METABOLIC PANEL
Anion gap: 12 (ref 5–15)
BUN: 11 mg/dL (ref 6–20)
CO2: 28 mmol/L (ref 22–32)
Calcium: 8.2 mg/dL — ABNORMAL LOW (ref 8.9–10.3)
Chloride: 98 mmol/L — ABNORMAL LOW (ref 101–111)
Creatinine, Ser: 0.55 mg/dL (ref 0.44–1.00)
GFR calc Af Amer: >60 mL/min (ref >60)
GFR calc non Af Amer: >60 mL/min (ref >60)
Glucose, Bld: 405 mg/dL — ABNORMAL HIGH (ref 65–99)
Potassium: 2.9 mmol/L — ABNORMAL LOW (ref 3.5–5.1)
Sodium: 138 mmol/L (ref 135–145)

## 2016-02-24 LAB — TROPONIN I
Troponin I: <0.03 ng/mL (ref <0.03)
Troponin I: <0.03 ng/mL (ref <0.03)

## 2016-02-24 LAB — HEPATIC FUNCTION PANEL
ALT: 41 U/L (ref 14–54)
AST: 31 U/L (ref 15–41)
Albumin: 4.2 g/dL (ref 3.5–5.0)
Alkaline Phosphatase: 122 U/L (ref 38–126)
Bilirubin, Direct: 0.3 mg/dL (ref 0.1–0.5)
Indirect Bilirubin: 3.4 mg/dL — ABNORMAL HIGH (ref 0.3–0.9)
Total Bilirubin: 3.7 mg/dL — ABNORMAL HIGH (ref 0.3–1.2)
Total Protein: 7 g/dL (ref 6.5–8.1)

## 2016-02-24 LAB — CBC
HCT: 40.6 % (ref 35.0–47.0)
Hemoglobin: 14.2 g/dL (ref 12.0–16.0)
MCH: 30.7 pg (ref 26.0–34.0)
MCHC: 35 g/dL (ref 32.0–36.0)
MCV: 87.5 fL (ref 80.0–100.0)
Platelets: 178 10*3/uL (ref 150–440)
RBC: 4.64 MIL/uL (ref 3.80–5.20)
RDW: 13.6 % (ref 11.5–14.5)
WBC: 7 10*3/uL (ref 3.6–11.0)

## 2016-02-24 LAB — GLUCOSE, CAPILLARY
Glucose-Capillary: 355 mg/dL — ABNORMAL HIGH (ref 65–99)
Glucose-Capillary: 240 mg/dL — ABNORMAL HIGH (ref 65–99)

## 2016-02-24 LAB — FIBRIN DERIVATIVES D-DIMER (ARMC ONLY): Fibrin derivatives D-dimer (ARMC): 330 (ref 0–499)

## 2016-02-24 MED ORDER — SODIUM CHLORIDE 0.9 % IV BOLUS (SEPSIS)
1000.0000 mL | Freq: Once | INTRAVENOUS | Status: AC
  Administered 2016-02-24: 1000 mL via INTRAVENOUS

## 2016-02-24 MED ORDER — POTASSIUM CHLORIDE CRYS ER 20 MEQ PO TBCR
40.0000 meq | EXTENDED_RELEASE_TABLET | Freq: Once | ORAL | Status: AC
  Administered 2016-02-24: 40 meq via ORAL
  Filled 2016-02-24: qty 2

## 2016-02-24 MED ORDER — GI COCKTAIL ~~LOC~~
30.0000 mL | Freq: Once | ORAL | Status: AC
  Administered 2016-02-24: 30 mL via ORAL
  Filled 2016-02-24: qty 30

## 2016-02-24 MED ORDER — LORAZEPAM 1 MG PO TABS
1.0000 mg | ORAL_TABLET | Freq: Once | ORAL | Status: AC
  Administered 2016-02-24: 1 mg via ORAL
  Filled 2016-02-24: qty 1

## 2016-02-24 MED ORDER — PROMETHAZINE HCL 12.5 MG PO TABS: 12.5000 mg | ORAL_TABLET | Freq: Four times a day (QID) | ORAL | 0 refills | Status: DC | PRN

## 2016-02-24 NOTE — ED Triage Notes (Addendum)
States chest pain that began last night and began to radiate to her neck and left chest this AM, also states elevated CBG this AM, states taking lantus and novolog, states pain increases at with deep breathes, awake and alert, EMS gave 324 ASA

## 2016-02-24 NOTE — ED Provider Notes (Signed)
[facility] Emergency Department Provider Note    First MD Initiated Contact with Patient 02/24/16 1541     (approximate)  I have reviewed the triage vital signs and the nursing notes.   HISTORY  Chief Complaint Chest Pain    HPI Laura-Lee Villegas is a 51 y.o. female with history of diabetes as well as acid reflux and chronic liver and pancreatitis presents with mid epigastric burning pain radiating up to her chest and neck throughout the day. States that the discomfort is constant. There is no change with exertion. States that she went to go drop off paperwork at [place] for Medicaid. States that she has been under more stress recently and had worsening pain. She called her primary care physician who told her to come to the ER. So, the ER she went to her house to walk her dog. States that the pain gradually worsened to 10 out of 10 in severity. At that point she called EMS to be brought to the ER. She denied any diaphoresis during this time. No nausea or vomiting. States that she's had what she thought were anginal pains in the past but this is different. States that she is concerned this is related to her hiatal hernia. She does not smoke.  She states that it does hurt when she takes a deep breath.   Past Medical History:  Diagnosis Date  . Diabetes mellitus without complication ([place])   . GERD (gastroesophageal reflux disease)   . Hypertension   . Liver disease   . Pancreatitis    History reviewed. No pertinent family history. Past Surgical History:  Procedure Laterality Date  . ABDOMINAL HYSTERECTOMY    . APPENDECTOMY    . BOWEL RESECTION    . BREAST BIOPSY Left 1989  . CHOLECYSTECTOMY    . CYST REMOVAL HAND Left 1992   Ganglion cyst removed from Left Wrist  . DILATATION & CURETTAGE/HYSTEROSCOPY WITH MYOSURE    . KNEE ARTHROSCOPY Left 1992  . PANCREAS SURGERY    . REPAIR OF ESOPHAGUS  2012   3 clamps placed in esophagus   Patient Active Problem List   Diagnosis Date Noted  . Unstable angina ([place]) 09/28/2015      Prior to Admission medications   Medication Sig Start Date End Date Taking? Authorizing Provider  amLODipine (NORVASC) 5 MG tablet Take 1 tablet (5 mg total) by mouth daily. 09/29/15  Yes Henreitta Leber, MD  aspirin EC 81 MG tablet Take 81 mg by mouth daily.   Yes Historical Provider, MD  gabapentin (NEURONTIN) 300 MG capsule Take 1 capsule (300 mg total) by mouth 3 (three) times daily. 12/10/15 12/09/16 Yes Carrie Mew, MD  insulin glargine (LANTUS) 100 UNIT/ML injection Inject 0.12 mLs (12 Units total) into the skin at bedtime. Patient taking differently: Inject 10 Units into the skin at bedtime.  09/29/15  Yes Henreitta Leber, MD  insulin lispro (HUMALOG) 100 UNIT/ML injection Inject 0.04 mLs (4 Units total) into the skin 3 (three) times daily with meals. Pt uses based off the amount of carbs she eats at each meal. Patient taking differently: Inject 2-9 Units into the skin 3 (three) times daily with meals. Pt uses based off the amount of carbs she eats at each meal. 09/29/15  Yes Henreitta Leber, MD  lisinopril (PRINIVIL,ZESTRIL) 20 MG tablet Take 1 tablet (20 mg total) by mouth daily. Patient taking differently: Take 40 mg by mouth daily.  09/29/15  Yes Henreitta Leber,  MD  Magnesium Oxide 500 MG TABS Take 1 tablet by mouth daily.   Yes Historical Provider, MD  pantoprazole (PROTONIX) 40 MG tablet Take 1 tablet (40 mg total) by mouth 2 (two) times daily. 09/29/15  Yes Henreitta Leber, MD  vitamin E 100 UNIT capsule Take 100 Units by mouth daily.   Yes Historical Provider, MD  vitamin E 400 UNIT capsule Take 400 Units by mouth daily.   Yes Historical Provider, MD  cloNIDine (CATAPRES) 0.1 MG tablet Take 1 tablet (0.1 mg total) by mouth 2 (two) times daily. Patient not taking: Reported on 02/24/2016 09/29/15   Henreitta Leber, MD  lipase/protease/amylase (CREON) 12000 units CPEP capsule Take 1 capsule (12,000 Units total) by mouth  4 (four) times daily -  with meals and at bedtime. Patient not taking: Reported on 02/24/2016 09/29/15   Henreitta Leber, MD  oxyCODONE (ROXICODONE) 5 MG immediate release tablet Take 1 tablet (5 mg total) by mouth every 8 (eight) hours as needed. 10/21/15 10/20/16  Earleen Newport, MD  promethazine (PHENERGAN) 12.5 MG tablet Take 1 tablet (12.5 mg total) by mouth every 6 (six) hours as needed for nausea or vomiting. 02/24/16   Merlyn Lot, MD    Allergies Demerol [meperidine]; Dilaudid [hydromorphone hcl]; Morphine and related; Darvon [propoxyphene]; Flexeril [cyclobenzaprine]; Levaquin [levofloxacin in d5w]; Soma [carisoprodol]; and Zofran Alvis Lemmings hcl]    Social History Social History  Substance Use Topics  . Smoking status: Former Smoker    Types: Cigarettes  . Smokeless tobacco: Former Systems developer  . Alcohol use No    Review of Systems Patient denies headaches, rhinorrhea, blurry vision, numbness, shortness of breath, chest pain, edema, cough, abdominal pain, nausea, vomiting, diarrhea, dysuria, fevers, rashes or hallucinations unless otherwise stated above in HPI. ____________________________________________   PHYSICAL EXAM:  VITAL SIGNS: Vitals:   02/24/16 1616 02/24/16 1959  BP: (!) 181/92 (!) 161/96  Pulse: 92 85  Resp: 18 18  Temp:      Constitutional: Alert and oriented. Anxious appearing but in no acute distress. Eyes: Conjunctivae are normal. PERRL. EOMI. Head: Atraumatic. Nose: No congestion/rhinnorhea. Mouth/Throat: Mucous membranes are moist.  Oropharynx non-erythematous. Neck: No stridor. Painless ROM. No cervical spine tenderness to palpation Hematological/Lymphatic/Immunilogical: No cervical lymphadenopathy. Cardiovascular: Normal rate, regular rhythm. Grossly normal heart sounds.  Good peripheral circulation. Respiratory: Normal respiratory effort.  No retractions. Lungs CTAB. Gastrointestinal: Soft with mild infraxyphoid ttp.. No distention. No  abdominal bruits. No CVA tenderness. Musculoskeletal: No lower extremity tenderness nor edema.  No joint effusions. Neurologic:  Normal speech and language. No gross focal neurologic deficits are appreciated. No gait instability. Skin:  Skin is warm, dry and intact. No rash noted.   ____________________________________________   LABS (all labs ordered are listed, but only abnormal results are displayed)  Results for orders placed or performed during the hospital encounter of 02/24/16 (from the past 24 hour(s))  Basic metabolic panel     Status: Abnormal   Collection Time: 02/24/16 11:23 AM  Result Value Ref Range   Sodium 138 135 - 145 mmol/L   Potassium 2.9 (L) 3.5 - 5.1 mmol/L   Chloride 98 (L) 101 - 111 mmol/L   CO2 28 22 - 32 mmol/L   Glucose, Bld 405 (H) 65 - 99 mg/dL   BUN 11 6 - 20 mg/dL   Creatinine, Ser 0.55 0.44 - 1.00 mg/dL   Calcium 8.2 (L) 8.9 - 10.3 mg/dL   GFR calc non Af Amer >60 >60 mL/min  GFR calc Af Amer >60 >60 mL/min   Anion gap 12 5 - 15  CBC     Status: None   Collection Time: 02/24/16 11:23 AM  Result Value Ref Range   WBC 7.0 3.6 - 11.0 K/uL   RBC 4.64 3.80 - 5.20 MIL/uL   Hemoglobin 14.2 12.0 - 16.0 g/dL   HCT 40.6 35.0 - 47.0 %   MCV 87.5 80.0 - 100.0 fL   MCH 30.7 26.0 - 34.0 pg   MCHC 35.0 32.0 - 36.0 g/dL   RDW 13.6 11.5 - 14.5 %   Platelets 178 150 - 440 K/uL  Troponin I     Status: None   Collection Time: 02/24/16 11:23 AM  Result Value Ref Range   Troponin I <0.03 <0.03 ng/mL  Glucose, capillary     Status: Abnormal   Collection Time: 02/24/16 11:29 AM  Result Value Ref Range   Glucose-Capillary 355 (H) 65 - 99 mg/dL  Glucose, capillary     Status: Abnormal   Collection Time: 02/24/16  3:20 PM  Result Value Ref Range   Glucose-Capillary 240 (H) 65 - 99 mg/dL  Troponin I     Status: None   Collection Time: 02/24/16  4:57 PM  Result Value Ref Range   Troponin I <0.03 <0.03 ng/mL  Hepatic function panel     Status: Abnormal    Collection Time: 02/24/16  4:57 PM  Result Value Ref Range   Total Protein 7.0 6.5 - 8.1 g/dL   Albumin 4.2 3.5 - 5.0 g/dL   AST 31 15 - 41 U/L   ALT 41 14 - 54 U/L   Alkaline Phosphatase 122 38 - 126 U/L   Total Bilirubin 3.7 (H) 0.3 - 1.2 mg/dL   Bilirubin, Direct 0.3 0.1 - 0.5 mg/dL   Indirect Bilirubin 3.4 (H) 0.3 - 0.9 mg/dL  Fibrin derivatives D-Dimer ([facility] only)     Status: None   Collection Time: 02/24/16  5:42 PM  Result Value Ref Range   Fibrin derivatives D-dimer ([facility]) 330 0 - 499   ____________________________________________  EKG My review and personal interpretation at Time: 11:29   Indication: chest pain  Rate: 95  Rhythm: sinus Axis: normal Other: non specific st changes, no STEMI, normal intervals, similar morphology to previous EKG 16:31 ____________________________________________  RADIOLOGY  I personally reviewed all radiographic images ordered to evaluate for the above acute complaints and reviewed radiology reports and findings.  These findings were personally discussed with the patient.  Please see medical record for radiology report. ____________________________________________   PROCEDURES  Procedure(s) performed:  Procedures    Critical Care performed: no ____________________________________________   INITIAL IMPRESSION / ASSESSMENT AND PLAN / ED COURSE  Pertinent labs & imaging results that were available during my care of the patient were reviewed by me and considered in my medical decision making (see chart for details).  DDX: ACS, pericarditis, esophagitis, boerhaaves, pe, dissection, pna, bronchitis, costochondritis   Tessa Seaberry is a 51 y.o. who presents to the ED with burning chest and epigastric discomfort.  Patient is AFVSS in ED. Exam as above. Given current presentation have considered the above differential.  EKG and troponin initially negative. Based on duration of symptoms I have lower suspicion for ACS. Patient is a Heart of  3.  Will repeat troponin for further risk stratify.  She has mild epigastric tenderness to palpation and based on her history of hiatal hernia I am concerned for possible gastritis. No right upper quadrant  tenderness to palpation. Do not feel is clinically consistent with cholecystitis or cholelithiasis. Patient with chronic pancreatitis. States this feels different. Will order D-dimer risk stratify for pulmonary embolism.  The patient will be placed on continuous pulse oximetry and telemetry for monitoring.  Laboratory evaluation will be sent to evaluate for the above complaints.     Clinical Course as of Feb 23 2357  Wed Feb 24, 2016  1906 Repeat troponin is negative. Do not feel this is consistent with ACS. Patient appears comfortable and in no acute distress.  D-dimer is also negative. As the patient's able to ambulate with a steady gait and pain has resolved do feel this is possible gastritis. Have encouraged patient to follow-up with cardiology as well as her primary care physician. Discussed signs and symptoms for which the patient should return to the ER.   Patient was able to tolerate PO and was able to ambulate with a steady gait.  Have discussed with the patient and available family all diagnostics and treatments performed thus far and all questions were answered to the best of my ability. The patient demonstrates understanding and agreement with plan. [PR]    Clinical Course User Index [PR] Merlyn Lot, MD     ____________________________________________   FINAL CLINICAL IMPRESSION(S) / ED DIAGNOSES  Final diagnoses:  Chest pain, unspecified type      NEW MEDICATIONS STARTED DURING THIS VISIT:  Discharge Medication List as of 02/24/2016  7:26 PM    START taking these medications   Details  promethazine (PHENERGAN) 12.5 MG tablet Take 1 tablet (12.5 mg total) by mouth every 6 (six) hours as needed for nausea or vomiting., Starting Wed 02/24/2016, Print         Note:   This document was prepared using Dragon voice recognition software and may include unintentional dictation errors.    Merlyn Lot, MD 02/25/16 0000

## 2016-03-27 ENCOUNTER — Encounter: Payer: Self-pay | Admitting: Emergency Medicine

## 2016-03-27 ENCOUNTER — Emergency Department
Admission: EM | Admit: 2016-03-27 | Discharge: 2016-03-27 | Disposition: A | Discharge status: Home / Self Care | Payer: Medicare Other | Attending: Emergency Medicine | Admitting: Emergency Medicine

## 2016-03-27 DIAGNOSIS — Z7982 Long term (current) use of aspirin: Secondary | ICD-10-CM | POA: Insufficient documentation

## 2016-03-27 DIAGNOSIS — I10 Essential (primary) hypertension: Secondary | ICD-10-CM | POA: Insufficient documentation

## 2016-03-27 DIAGNOSIS — Z87891 Personal history of nicotine dependence: Secondary | ICD-10-CM | POA: Insufficient documentation

## 2016-03-27 DIAGNOSIS — K047 Periapical abscess without sinus: Secondary | ICD-10-CM

## 2016-03-27 DIAGNOSIS — Z794 Long term (current) use of insulin: Secondary | ICD-10-CM | POA: Insufficient documentation

## 2016-03-27 DIAGNOSIS — Z79899 Other long term (current) drug therapy: Secondary | ICD-10-CM | POA: Diagnosis not present

## 2016-03-27 DIAGNOSIS — K0889 Other specified disorders of teeth and supporting structures: Secondary | ICD-10-CM | POA: Diagnosis present

## 2016-03-27 DIAGNOSIS — E119 Type 2 diabetes mellitus without complications: Secondary | ICD-10-CM | POA: Diagnosis not present

## 2016-03-27 MED ORDER — AMOXICILLIN 500 MG PO TABS: 500.0000 mg | ORAL_TABLET | Freq: Three times a day (TID) | ORAL | 0 refills | Status: DC

## 2016-03-27 NOTE — Discharge Instructions (Signed)
Please call and schedule a dental appointment as soon as possible. You will need to be seen within the next 14 days. Return to the emergency department for symptoms that change or worsen if you're unable to schedule an appointment.

## 2016-03-27 NOTE — ED Provider Notes (Signed)
[facility] Emergency Department Provider Note ____________________________________________  Time seen: Approximately 7:16 AM  I have reviewed the triage vital signs and the nursing notes.   HISTORY  Chief Complaint Dental Pain   HPI Amber Christensen is a 51 y.o. female who presents to the emergency department for evaluation of dental pain. She states that she has had a fractured tooth on the bottom right side of her mouth for about 2 months. She has been evaluated by her dentist and given hydrocodone, but states that the area began to swell and pain has increased. She contacted her dentist who advised her to come to the emergency department for treatment with antibiotics.  Past Medical History:  Diagnosis Date  . Diabetes mellitus without complication ([place])   . GERD (gastroesophageal reflux disease)   . Hypertension   . Liver disease   . Pancreatitis     Patient Active Problem List   Diagnosis Date Noted  . Unstable angina ([place]) 09/28/2015    Past Surgical History:  Procedure Laterality Date  . ABDOMINAL HYSTERECTOMY    . APPENDECTOMY    . BLADDER SURGERY    . BOWEL RESECTION    . BREAST BIOPSY Left 1989  . CHOLECYSTECTOMY    . CYST REMOVAL HAND Left 1992   Ganglion cyst removed from Left Wrist  . DILATATION & CURETTAGE/HYSTEROSCOPY WITH MYOSURE    . KNEE ARTHROSCOPY Left 1992  . PANCREAS SURGERY    . REPAIR OF ESOPHAGUS  2012   3 clamps placed in esophagus    Prior to Admission medications   Medication Sig Start Date End Date Taking? Authorizing Provider  amLODipine (NORVASC) 5 MG tablet Take 1 tablet (5 mg total) by mouth daily. 09/29/15   Henreitta Leber, MD  amoxicillin (AMOXIL) 500 MG tablet Take 1 tablet (500 mg total) by mouth 3 (three) times daily. 03/27/16   Victorino Dike, FNP  aspirin EC 81 MG tablet Take 81 mg by mouth daily.    Historical Provider, MD  cloNIDine (CATAPRES) 0.1 MG tablet Take 1 tablet (0.1 mg total) by mouth 2 (two)  times daily. Patient not taking: Reported on 02/24/2016 09/29/15   Henreitta Leber, MD  gabapentin (NEURONTIN) 300 MG capsule Take 1 capsule (300 mg total) by mouth 3 (three) times daily. 12/10/15 12/09/16  Carrie Mew, MD  insulin glargine (LANTUS) 100 UNIT/ML injection Inject 0.12 mLs (12 Units total) into the skin at bedtime. Patient taking differently: Inject 10 Units into the skin at bedtime.  09/29/15   Henreitta Leber, MD  insulin lispro (HUMALOG) 100 UNIT/ML injection Inject 0.04 mLs (4 Units total) into the skin 3 (three) times daily with meals. Pt uses based off the amount of carbs she eats at each meal. Patient taking differently: Inject 2-9 Units into the skin 3 (three) times daily with meals. Pt uses based off the amount of carbs she eats at each meal. 09/29/15   Henreitta Leber, MD  lipase/protease/amylase (CREON) 12000 units CPEP capsule Take 1 capsule (12,000 Units total) by mouth 4 (four) times daily -  with meals and at bedtime. Patient not taking: Reported on 02/24/2016 09/29/15   Henreitta Leber, MD  lisinopril (PRINIVIL,ZESTRIL) 20 MG tablet Take 1 tablet (20 mg total) by mouth daily. Patient taking differently: Take 40 mg by mouth daily.  09/29/15   Henreitta Leber, MD  Magnesium Oxide 500 MG TABS Take 1 tablet by mouth daily.    Historical Provider, MD  oxyCODONE (  ROXICODONE) 5 MG immediate release tablet Take 1 tablet (5 mg total) by mouth every 8 (eight) hours as needed. 10/21/15 10/20/16  Earleen Newport, MD  pantoprazole (PROTONIX) 40 MG tablet Take 1 tablet (40 mg total) by mouth 2 (two) times daily. 09/29/15   Henreitta Leber, MD  promethazine (PHENERGAN) 12.5 MG tablet Take 1 tablet (12.5 mg total) by mouth every 6 (six) hours as needed for nausea or vomiting. 02/24/16   Merlyn Lot, MD  vitamin E 100 UNIT capsule Take 100 Units by mouth daily.    Historical Provider, MD  vitamin E 400 UNIT capsule Take 400 Units by mouth daily.    Historical Provider, MD     Allergies Demerol [meperidine]; Dilaudid [hydromorphone hcl]; Morphine and related; Darvon [propoxyphene]; Flexeril [cyclobenzaprine]; Levaquin [levofloxacin in d5w]; Soma [carisoprodol]; and Zofran [ondansetron hcl]  History reviewed. No pertinent family history.  Social History Social History  Substance Use Topics  . Smoking status: Former Smoker    Types: Cigarettes  . Smokeless tobacco: Former Systems developer  . Alcohol use No    Review of Systems Constitutional: Well appearing. Negative for fever. ENT: Positive for dental pain Musculoskeletal: Negative for jaw pain/trismus. Skin: Positive for facial swelling. ____________________________________________   PHYSICAL EXAM:  VITAL SIGNS: ED Triage Vitals  Enc Vitals Group     BP 03/27/16 0456 (!) 153/97     Pulse Rate 03/27/16 0456 94     Resp 03/27/16 0456 18     Temp 03/27/16 0456 98.3 F (36.8 C)     Temp Source 03/27/16 0456 Oral     SpO2 03/27/16 0456 97 %     Weight 03/27/16 0458 120 lb (54.4 kg)     Height 03/27/16 0458 5' 4"  (1.626 m)     Head Circumference --      Peak Flow --      Pain Score 03/27/16 0458 10     Pain Loc --      Pain Edu? --      Excl. in [place]? --     Constitutional: Alert and oriented. Well appearing and in no acute distress. Eyes: Conjunctivae are normal.EOMI. Mouth/Throat: Mucous membranes are moist. Oropharynx non-erythematous. Periodontal Exam    Hematological/Lymphatic/Immunilogical: No cervical lymphadenopathy. Respiratory: Normal respiratory effort.  Musculoskeletal: Full ROM x 4 extremities. Neurologic:  Normal speech and language. No gross focal neurologic deficits are appreciated. Speech is normal. No gait instability. Skin:  Mild facial swelling without erythema overlying the right lower jaw. Buccal surfaces and sublingual area is without evidence of induration. Psychiatric: Mood and affect are normal. Speech and behavior are  normal.  ____________________________________________   LABS (all labs ordered are listed, but only abnormal results are displayed)  Labs Reviewed - No data to display ____________________________________________   RADIOLOGY  Not indicated ____________________________________________   PROCEDURES  Procedure(s) performed: None  Critical Care performed: No  ____________________________________________   INITIAL IMPRESSION / ASSESSMENT AND PLAN / ED COURSE  Pertinent labs & imaging results that were available during my care of the patient were reviewed by me and considered in my medical decision making (see chart for details).  51 year old female who presents to the emergency department for evaluation of dental pain. She is under the care of her dentist who plans to send her to an oral surgeon next week. She will be prescribed amoxicillin and advised to take her pain medication as prescribed. She was advised to return to the ER for symptoms that change or worsen  if unable to see her dentist or oral surgeon.  ____________________________________________   FINAL CLINICAL IMPRESSION(S) / ED DIAGNOSES  Final diagnoses:  Dental abscess    Note:  This document was prepared using Dragon voice recognition software and may include unintentional dictation errors.    Victorino Dike, FNP 03/28/16 1606    Harvest Dark, MD 03/28/16 2239

## 2016-03-27 NOTE — ED Triage Notes (Signed)
Pt says she broke a tooth on the bottom right side of there mouth about 2 months ago; has been hurting since but worsening; went to her dentist on Friday but says they were unable to pull it due to her "unstable blood pressure"; pt says pressure was taken but she was not told what it is; was prescribed hydrocodone for the pain; here tonight as she has taken 2 tonight and not feeling any better; last taken at 2am; pt says she called her dentist in the last few hours and was told to come to the ED if pain not any better after second dose in case she needs antibiotics; mild swelling noted to area;

## 2016-04-06 ENCOUNTER — Emergency Department
Admission: EM | Admit: 2016-04-06 | Discharge: 2016-04-06 | Disposition: A | Discharge status: Home / Self Care | Payer: Medicare Other | Attending: Student in an Organized Health Care Education/Training Program | Admitting: Student in an Organized Health Care Education/Training Program

## 2016-04-06 ENCOUNTER — Emergency Department: Payer: Medicare Other

## 2016-04-06 DIAGNOSIS — R739 Hyperglycemia, unspecified: Secondary | ICD-10-CM

## 2016-04-06 DIAGNOSIS — R1012 Left upper quadrant pain: Secondary | ICD-10-CM | POA: Diagnosis present

## 2016-04-06 DIAGNOSIS — E1165 Type 2 diabetes mellitus with hyperglycemia: Secondary | ICD-10-CM | POA: Insufficient documentation

## 2016-04-06 DIAGNOSIS — R110 Nausea: Secondary | ICD-10-CM

## 2016-04-06 DIAGNOSIS — Z87891 Personal history of nicotine dependence: Secondary | ICD-10-CM | POA: Diagnosis not present

## 2016-04-06 DIAGNOSIS — Z79899 Other long term (current) drug therapy: Secondary | ICD-10-CM | POA: Insufficient documentation

## 2016-04-06 DIAGNOSIS — I10 Essential (primary) hypertension: Secondary | ICD-10-CM | POA: Insufficient documentation

## 2016-04-06 DIAGNOSIS — Z7982 Long term (current) use of aspirin: Secondary | ICD-10-CM | POA: Diagnosis not present

## 2016-04-06 DIAGNOSIS — Z794 Long term (current) use of insulin: Secondary | ICD-10-CM | POA: Diagnosis not present

## 2016-04-06 LAB — CBC
HCT: 40.3 % (ref 35.0–47.0)
Hemoglobin: 14 g/dL (ref 12.0–16.0)
MCH: 31 pg (ref 26.0–34.0)
MCHC: 34.8 g/dL (ref 32.0–36.0)
MCV: 89.2 fL (ref 80.0–100.0)
Platelets: 243 10*3/uL (ref 150–440)
RBC: 4.51 MIL/uL (ref 3.80–5.20)
RDW: 13.9 % (ref 11.5–14.5)
WBC: 10 10*3/uL (ref 3.6–11.0)

## 2016-04-06 LAB — BASIC METABOLIC PANEL
Anion gap: 10 (ref 5–15)
BUN: 25 mg/dL — ABNORMAL HIGH (ref 6–20)
CO2: 26 mmol/L (ref 22–32)
Calcium: 9.2 mg/dL (ref 8.9–10.3)
Chloride: 99 mmol/L — ABNORMAL LOW (ref 101–111)
Creatinine, Ser: 0.88 mg/dL (ref 0.44–1.00)
GFR calc Af Amer: >60 mL/min (ref >60)
GFR calc non Af Amer: >60 mL/min (ref >60)
Glucose, Bld: 210 mg/dL — ABNORMAL HIGH (ref 65–99)
Potassium: 4.1 mmol/L (ref 3.5–5.1)
Sodium: 135 mmol/L (ref 135–145)

## 2016-04-06 LAB — URINALYSIS, COMPLETE (UACMP) WITH MICROSCOPIC
Bacteria, UA: NONE SEEN
Bilirubin Urine: NEGATIVE
Glucose, UA: 50 mg/dL — AB
Hgb urine dipstick: NEGATIVE
Ketones, ur: NEGATIVE mg/dL
Nitrite: NEGATIVE
Protein, ur: NEGATIVE mg/dL
Specific Gravity, Urine: 1.013 (ref 1.005–1.030)
pH: 5 (ref 5.0–8.0)

## 2016-04-06 LAB — TROPONIN I: Troponin I: <0.03 ng/mL (ref <0.03)

## 2016-04-06 LAB — GLUCOSE, CAPILLARY: Glucose-Capillary: 186 mg/dL — ABNORMAL HIGH (ref 65–99)

## 2016-04-06 MED ORDER — PROMETHAZINE HCL 25 MG/ML IJ SOLN: 25.0000 mg | Freq: Once | INTRAMUSCULAR | Status: DC

## 2016-04-06 MED ORDER — PROMETHAZINE HCL 12.5 MG PO TABS: 12.5000 mg | ORAL_TABLET | Freq: Four times a day (QID) | ORAL | 0 refills | Status: DC | PRN

## 2016-04-06 MED ORDER — METOPROLOL TARTRATE 25 MG PO TABS: 25.0000 mg | ORAL_TABLET | Freq: Two times a day (BID) | ORAL | 0 refills | Status: DC

## 2016-04-06 MED ORDER — PROMETHAZINE HCL 25 MG PO TABS
12.5000 mg | ORAL_TABLET | Freq: Once | ORAL | Status: AC
  Administered 2016-04-06: 12.5 mg via ORAL
  Filled 2016-04-06: qty 1

## 2016-04-06 NOTE — ED Notes (Signed)
Attempted IV access for patient, unsuccessful.

## 2016-04-06 NOTE — ED Notes (Signed)
Patient transported to X-ray 

## 2016-04-06 NOTE — ED Triage Notes (Addendum)
Nausea and high blood sugar X 2 days. Pt reports left sided rib pain. Pt alert and oriented X4, active, cooperative, pt in NAD. RR even and unlabored, color WNL.   CBG 618 at home.

## 2016-04-06 NOTE — ED Provider Notes (Signed)
[facility] Emergency Department Provider Note    First MD Initiated Contact with Patient 04/06/16 1140     (approximate)  I have reviewed the triage vital signs and the nursing notes.   HISTORY  Chief Complaint Nausea and Hyperglycemia    HPI Amber Christensen is a 51 y.o. female with a history of diabetes, GERD, hypertension and pancreatitis presents with chief complaint of elevated blood sugar as well as intermittent left-sided upper quadrant pain and nausea. States her sugar was 600 yesterday. She took 10 units of her insulin yesterday evening. Recheck this morning it was still 300 solution before she came to the ER. Today due to chest pain or shortness of breath. No numbness or tingling. She has been tolerating oral hydration. No dysuria.   Past Medical History:  Diagnosis Date  . Diabetes mellitus without complication ([place])   . GERD (gastroesophageal reflux disease)   . Hypertension   . Liver disease   . Pancreatitis    No family history on file. Past Surgical History:  Procedure Laterality Date  . ABDOMINAL HYSTERECTOMY    . APPENDECTOMY    . BLADDER SURGERY    . BOWEL RESECTION    . BREAST BIOPSY Left 1989  . CHOLECYSTECTOMY    . CYST REMOVAL HAND Left 1992   Ganglion cyst removed from Left Wrist  . DILATATION & CURETTAGE/HYSTEROSCOPY WITH MYOSURE    . KNEE ARTHROSCOPY Left 1992  . PANCREAS SURGERY    . REPAIR OF ESOPHAGUS  2012   3 clamps placed in esophagus   Patient Active Problem List   Diagnosis Date Noted  . Unstable angina ([place]) 09/28/2015      Prior to Admission medications   Medication Sig Start Date End Date Taking? Authorizing Provider  amLODipine (NORVASC) 5 MG tablet Take 1 tablet (5 mg total) by mouth daily. 09/29/15   Henreitta Leber, MD  amoxicillin (AMOXIL) 500 MG tablet Take 1 tablet (500 mg total) by mouth 3 (three) times daily. 03/27/16   Victorino Dike, FNP  aspirin EC 81 MG tablet Take 81 mg by mouth daily.     Historical Provider, MD  cloNIDine (CATAPRES) 0.1 MG tablet Take 1 tablet (0.1 mg total) by mouth 2 (two) times daily. Patient not taking: Reported on 02/24/2016 09/29/15   Henreitta Leber, MD  gabapentin (NEURONTIN) 300 MG capsule Take 1 capsule (300 mg total) by mouth 3 (three) times daily. 12/10/15 12/09/16  Carrie Mew, MD  insulin glargine (LANTUS) 100 UNIT/ML injection Inject 0.12 mLs (12 Units total) into the skin at bedtime. Patient taking differently: Inject 10 Units into the skin at bedtime.  09/29/15   Henreitta Leber, MD  insulin lispro (HUMALOG) 100 UNIT/ML injection Inject 0.04 mLs (4 Units total) into the skin 3 (three) times daily with meals. Pt uses based off the amount of carbs she eats at each meal. Patient taking differently: Inject 2-9 Units into the skin 3 (three) times daily with meals. Pt uses based off the amount of carbs she eats at each meal. 09/29/15   Henreitta Leber, MD  lipase/protease/amylase (CREON) 12000 units CPEP capsule Take 1 capsule (12,000 Units total) by mouth 4 (four) times daily -  with meals and at bedtime. Patient not taking: Reported on 02/24/2016 09/29/15   Henreitta Leber, MD  lisinopril (PRINIVIL,ZESTRIL) 20 MG tablet Take 1 tablet (20 mg total) by mouth daily. Patient taking differently: Take 40 mg by mouth daily.  09/29/15  Henreitta Leber, MD  Magnesium Oxide 500 MG TABS Take 1 tablet by mouth daily.    Historical Provider, MD  oxyCODONE (ROXICODONE) 5 MG immediate release tablet Take 1 tablet (5 mg total) by mouth every 8 (eight) hours as needed. 10/21/15 10/20/16  Earleen Newport, MD  pantoprazole (PROTONIX) 40 MG tablet Take 1 tablet (40 mg total) by mouth 2 (two) times daily. 09/29/15   Henreitta Leber, MD  promethazine (PHENERGAN) 12.5 MG tablet Take 1 tablet (12.5 mg total) by mouth every 6 (six) hours as needed for nausea or vomiting. 02/24/16   Merlyn Lot, MD  vitamin E 100 UNIT capsule Take 100 Units by mouth daily.    Historical  Provider, MD  vitamin E 400 UNIT capsule Take 400 Units by mouth daily.    Historical Provider, MD    Allergies Demerol [meperidine]; Dilaudid [hydromorphone hcl]; Morphine and related; Darvon [propoxyphene]; Flexeril [cyclobenzaprine]; Levaquin [levofloxacin in d5w]; Soma [carisoprodol]; and Zofran Alvis Lemmings hcl]    Social History Social History  Substance Use Topics  . Smoking status: Former Smoker    Types: Cigarettes  . Smokeless tobacco: Former Systems developer  . Alcohol use No    Review of Systems Patient denies headaches, rhinorrhea, blurry vision, numbness, shortness of breath, chest pain, edema, cough, abdominal pain, nausea, vomiting, diarrhea, dysuria, fevers, rashes or hallucinations unless otherwise stated above in HPI. ____________________________________________   PHYSICAL EXAM:  VITAL SIGNS: Vitals:   04/06/16 1119  BP: (!) 132/97  Pulse: 94  Resp: 16  Temp: 97.8 F (36.6 C)    Constitutional: Alert and oriented. Well appearing and in no acute distress. Eyes: Conjunctivae are normal. PERRL. EOMI. Head: Atraumatic. Nose: No congestion/rhinnorhea. Mouth/Throat: Mucous membranes are moist.  Oropharynx non-erythematous. Neck: No stridor. Painless ROM. No cervical spine tenderness to palpation Hematological/Lymphatic/Immunilogical: No cervical lymphadenopathy. Cardiovascular: Normal rate, regular rhythm. Grossly normal heart sounds.  Good peripheral circulation. Respiratory: Normal respiratory effort.  No retractions. Lungs CTAB. Gastrointestinal: Soft and nontender. No distention. No abdominal bruits. No CVA tenderness. Musculoskeletal: No lower extremity tenderness nor edema.  No joint effusions. Neurologic:  Normal speech and language. No gross focal neurologic deficits are appreciated. No gait instability. Skin:  Skin is warm, dry and intact. No rash noted. Psychiatric: Mood and affect are normal. Speech and behavior are  normal.  ____________________________________________   LABS (all labs ordered are listed, but only abnormal results are displayed)  Results for orders placed or performed during the hospital encounter of 04/06/16 (from the past 24 hour(s))  Glucose, capillary     Status: Abnormal   Collection Time: 04/06/16 11:22 AM  Result Value Ref Range   Glucose-Capillary 186 (H) 65 - 99 mg/dL   ____________________________________________  EKG My review and personal interpretation at Time: 11:24   Indication: left rib pain  Rate: 85  Rhythm: sinus Axis: normal Other: normal intervals, no acute ischemia ____________________________________________  RADIOLOGY  I personally reviewed all radiographic images ordered to evaluate for the above acute complaints and reviewed radiology reports and findings.  These findings were personally discussed with the patient.  Please see medical record for radiology report.  ____________________________________________   PROCEDURES  Procedure(s) performed:  Procedures    Critical Care performed: no ____________________________________________   INITIAL IMPRESSION / ASSESSMENT AND PLAN / ED COURSE  Pertinent labs & imaging results that were available during my care of the patient were reviewed by me and considered in my medical decision making (see chart for details).  DDX: gerd, dka,  hhs, dehydration, cad, pna  Railyn House is a 51 y.o. who presents to the ED with chief complaint of hyperglycemia and nausea and left upper quadrant pain. Her abdominal exam is soft and benign. She is afebrile and hemodynamically stable. EKG shows no evidence of acute ischemia and her troponin is negative. Do not feel this is consistent with ACS. She has no evidence of DKA. There is no evidence of HHS. Renal function is normal. There is no acidosis. Chest x-ray shows no evidence of effusion, pneumonia, heart failure or pneumothorax. Hyperglycemia has resolved with home  medication. Do feel patient is stable for close follow-up with PCP.  Clinical Course as of Apr 07 1247  Wed Apr 06, 2016  1244 Glucose-Capillary: Marland Kitchen 186 [PR]    Clinical Course User Index [PR] Merlyn Lot, MD   Patient was able to tolerate PO and was able to ambulate with a steady gait.  Have discussed with the patient and available family all diagnostics and treatments performed thus far and all questions were answered to the best of my ability. The patient demonstrates understanding and agreement with plan.   ____________________________________________   FINAL CLINICAL IMPRESSION(S) / ED DIAGNOSES  Final diagnoses:  Nausea  Hyperglycemia      NEW MEDICATIONS STARTED DURING THIS VISIT:  New Prescriptions   No medications on file     Note:  This document was prepared using Dragon voice recognition software and may include unintentional dictation errors.    Merlyn Lot, MD 04/06/16 [phone]

## 2016-04-18 ENCOUNTER — Emergency Department
Admission: EM | Admit: 2016-04-18 | Discharge: 2016-04-18 | Disposition: A | Discharge status: Home / Self Care | Payer: Medicare Other | Source: Home / Self Care | Attending: Emergency Medicine | Admitting: Emergency Medicine

## 2016-04-18 ENCOUNTER — Emergency Department: Payer: Medicare Other

## 2016-04-18 ENCOUNTER — Encounter: Payer: Self-pay | Admitting: Emergency Medicine

## 2016-04-18 DIAGNOSIS — Z79899 Other long term (current) drug therapy: Secondary | ICD-10-CM

## 2016-04-18 DIAGNOSIS — Z794 Long term (current) use of insulin: Secondary | ICD-10-CM | POA: Insufficient documentation

## 2016-04-18 DIAGNOSIS — F419 Anxiety disorder, unspecified: Secondary | ICD-10-CM | POA: Insufficient documentation

## 2016-04-18 DIAGNOSIS — Z87891 Personal history of nicotine dependence: Secondary | ICD-10-CM | POA: Insufficient documentation

## 2016-04-18 DIAGNOSIS — R197 Diarrhea, unspecified: Secondary | ICD-10-CM

## 2016-04-18 DIAGNOSIS — E1165 Type 2 diabetes mellitus with hyperglycemia: Secondary | ICD-10-CM | POA: Insufficient documentation

## 2016-04-18 DIAGNOSIS — Z7982 Long term (current) use of aspirin: Secondary | ICD-10-CM | POA: Insufficient documentation

## 2016-04-18 DIAGNOSIS — R21 Rash and other nonspecific skin eruption: Secondary | ICD-10-CM | POA: Diagnosis not present

## 2016-04-18 DIAGNOSIS — M791 Myalgia, unspecified site: Secondary | ICD-10-CM

## 2016-04-18 DIAGNOSIS — L958 Other vasculitis limited to the skin: Secondary | ICD-10-CM | POA: Diagnosis not present

## 2016-04-18 DIAGNOSIS — I10 Essential (primary) hypertension: Secondary | ICD-10-CM

## 2016-04-18 LAB — CBC WITH DIFFERENTIAL/PLATELET
Basophils Absolute: 0 10*3/uL (ref 0–0.1)
Basophils Relative: 1 %
Eosinophils Absolute: 0.3 10*3/uL (ref 0–0.7)
Eosinophils Relative: 5 %
HCT: 35.3 % (ref 35.0–47.0)
Hemoglobin: 12.2 g/dL (ref 12.0–16.0)
Lymphocytes Relative: 31 %
Lymphs Abs: 1.6 10*3/uL (ref 1.0–3.6)
MCH: 31.3 pg (ref 26.0–34.0)
MCHC: 34.5 g/dL (ref 32.0–36.0)
MCV: 90.6 fL (ref 80.0–100.0)
Monocytes Absolute: 0.3 10*3/uL (ref 0.2–0.9)
Monocytes Relative: 6 %
Neutro Abs: 2.9 10*3/uL (ref 1.4–6.5)
Neutrophils Relative %: 57 %
Platelets: 183 10*3/uL (ref 150–440)
RBC: 3.9 MIL/uL (ref 3.80–5.20)
RDW: 14 % (ref 11.5–14.5)
WBC: 5 10*3/uL (ref 3.6–11.0)

## 2016-04-18 LAB — COMPREHENSIVE METABOLIC PANEL
ALT: 35 U/L (ref 14–54)
AST: 32 U/L (ref 15–41)
Albumin: 3.9 g/dL (ref 3.5–5.0)
Alkaline Phosphatase: 113 U/L (ref 38–126)
Anion gap: 6 (ref 5–15)
BUN: 8 mg/dL (ref 6–20)
CO2: 29 mmol/L (ref 22–32)
Calcium: 7.5 mg/dL — ABNORMAL LOW (ref 8.9–10.3)
Chloride: 102 mmol/L (ref 101–111)
Creatinine, Ser: 0.65 mg/dL (ref 0.44–1.00)
GFR calc Af Amer: >60 mL/min (ref >60)
GFR calc non Af Amer: >60 mL/min (ref >60)
Glucose, Bld: 329 mg/dL — ABNORMAL HIGH (ref 65–99)
Potassium: 3.1 mmol/L — ABNORMAL LOW (ref 3.5–5.1)
Sodium: 137 mmol/L (ref 135–145)
Total Bilirubin: 3 mg/dL — ABNORMAL HIGH (ref 0.3–1.2)
Total Protein: 6.6 g/dL (ref 6.5–8.1)

## 2016-04-18 LAB — TROPONIN I: Troponin I: <0.03 ng/mL (ref <0.03)

## 2016-04-18 LAB — GLUCOSE, CAPILLARY: Glucose-Capillary: 316 mg/dL — ABNORMAL HIGH (ref 65–99)

## 2016-04-18 MED ORDER — DIAZEPAM 5 MG PO TABS: 5.0000 mg | ORAL_TABLET | Freq: Three times a day (TID) | ORAL | 0 refills | Status: DC | PRN

## 2016-04-18 MED ORDER — SODIUM CHLORIDE 0.9 % IV SOLN
Freq: Once | INTRAVENOUS | Status: AC
  Administered 2016-04-18: 07:00:00 via INTRAVENOUS

## 2016-04-18 MED ORDER — IOPAMIDOL (ISOVUE-370) INJECTION 76%
75.0000 mL | Freq: Once | INTRAVENOUS | Status: AC | PRN
  Administered 2016-04-18: 75 mL via INTRAVENOUS

## 2016-04-18 MED ORDER — KETOROLAC TROMETHAMINE 30 MG/ML IJ SOLN
30.0000 mg | Freq: Once | INTRAMUSCULAR | Status: AC
  Administered 2016-04-18: 30 mg via INTRAVENOUS
  Filled 2016-04-18: qty 1

## 2016-04-18 MED ORDER — PROMETHAZINE HCL 25 MG/ML IJ SOLN
25.0000 mg | Freq: Once | INTRAMUSCULAR | Status: AC
  Administered 2016-04-18: 25 mg via INTRAVENOUS
  Filled 2016-04-18: qty 1

## 2016-04-18 NOTE — ED Notes (Signed)
Pt returned from CT °

## 2016-04-18 NOTE — ED Provider Notes (Signed)
[facility] Emergency Department Provider Note        Time seen: ----------------------------------------- 7:16 AM on 04/18/2016 -----------------------------------------    I have reviewed the triage vital signs and the nursing notes.   HISTORY  Chief Complaint Generalized Body Aches (Pt. states upper body pain that radiates down both arms.)    HPI Amber Christensen is a 51 y.o. female who presents to ER for upper body pain, neck pain and chest pain. Patient has pain that radiates into her back and radiates to both arms. She has numbness in her right hand as well as nausea without vomiting. She has had some diarrhea for the past 2 days. Pain is currently 8 out of 10 in her upper body and is sharp. She denies fevers, chills or other complaints.   Past Medical History:  Diagnosis Date  . Diabetes mellitus without complication ([place])   . GERD (gastroesophageal reflux disease)   . Hypertension   . Liver disease   . Pancreatitis     Patient Active Problem List   Diagnosis Date Noted  . Unstable angina ([place]) 09/28/2015    Past Surgical History:  Procedure Laterality Date  . ABDOMINAL HYSTERECTOMY    . APPENDECTOMY    . BLADDER SURGERY    . BOWEL RESECTION    . BREAST BIOPSY Left 1989  . CHOLECYSTECTOMY    . CYST REMOVAL HAND Left 1992   Ganglion cyst removed from Left Wrist  . DILATATION & CURETTAGE/HYSTEROSCOPY WITH MYOSURE    . KNEE ARTHROSCOPY Left 1992  . PANCREAS SURGERY    . REPAIR OF ESOPHAGUS  2012   3 clamps placed in esophagus    Allergies Demerol [meperidine]; Dilaudid [hydromorphone hcl]; Morphine and related; Darvon [propoxyphene]; Flexeril [cyclobenzaprine]; Levaquin [levofloxacin in d5w]; Soma [carisoprodol]; and Zofran Alvis Lemmings hcl]  Social History Social History  Substance Use Topics  . Smoking status: Former Smoker    Types: Cigarettes  . Smokeless tobacco: Former Systems developer  . Alcohol use No    Review of  Systems Constitutional: Negative for fever. Cardiovascular: Positive for chest pain Respiratory: Negative for shortness of breath. Gastrointestinal: Negative for abdominal pain, Positive for nausea Genitourinary: Negative for dysuria. Musculoskeletal: Positive for back pain Skin: Negative for rash. Neurological: Negative for headaches, focal weakness or numbness.  10-point ROS otherwise negative.  ____________________________________________   PHYSICAL EXAM:  VITAL SIGNS: ED Triage Vitals  Enc Vitals Group     BP --      Pulse Rate 04/18/16 0642 93     Resp 04/18/16 0642 19     Temp 04/18/16 0642 97.6 F (36.4 C)     Temp Source 04/18/16 0642 Oral     SpO2 04/18/16 0643 100 %     Weight 04/18/16 0643 124 lb (56.2 kg)     Height 04/18/16 0643 5' 4"  (1.626 m)     Head Circumference --      Peak Flow --      Pain Score 04/18/16 0643 8     Pain Loc --      Pain Edu? --      Excl. in [place]? --     Constitutional: Alert and oriented. Well appearing and in no distress. Eyes: Conjunctivae are normal. PERRL. Normal extraocular movements. ENT   Head: Normocephalic and atraumatic.   Nose: No congestion/rhinnorhea.   Mouth/Throat: Mucous membranes are moist.   Neck: No stridor. Cardiovascular: Normal rate, regular rhythm. No murmurs, rubs, or gallops. Respiratory: Normal respiratory effort without tachypnea  nor retractions. Breath sounds are clear and equal bilaterally. No wheezes/rales/rhonchi. Gastrointestinal: Soft and nontender. Normal bowel sounds Musculoskeletal: Nontender with normal range of motion in all extremities. No lower extremity tenderness nor edema. Neurologic:  Normal speech and language. No gross focal neurologic deficits are appreciated.  Skin:  Skin is warm, dry and intact. No rash noted. Psychiatric: Mood and affect are normal. Speech and behavior are normal.  ____________________________________________  EKG: Interpreted by me.Sinus rhythm rate  of 90 bpm, normal PR interval, possible LVH, normal QT.  ____________________________________________  ED COURSE:  Pertinent labs & imaging results that were available during my care of the patient were reviewed by me and considered in my medical decision making (see chart for details). Patient presents to ER in no distress with chest and back pain. We will assess with labs and imaging.   Procedures ____________________________________________   LABS (pertinent positives/negatives)  Labs Reviewed  GLUCOSE, CAPILLARY - Abnormal; Notable for the following:       Result Value   Glucose-Capillary 316 (*)    All other components within normal limits  COMPREHENSIVE METABOLIC PANEL - Abnormal; Notable for the following:    Potassium 3.1 (*)    Glucose, Bld 329 (*)    Calcium 7.5 (*)    Total Bilirubin 3.0 (*)    All other components within normal limits  CBC WITH DIFFERENTIAL/PLATELET  TROPONIN I  CBG MONITORING, ED    RADIOLOGY  CTA of the chest  IMPRESSION: No evidence of aortic dissection or acute vascular pathology.  Lungs are clear.  Stable gastroesophageal varices. ____________________________________________  FINAL ASSESSMENT AND PLAN  Chest pain, back pain, nausea, hyperglycemia  Plan: Patient with labs and imaging as dictated above. Indeterminate etiology for the patient's symptoms. Possibly panic attack related. Her hyperglycemia is mild and should be amenable to outpatient treatment. She is stable for outpatient follow-up with her doctor.   Earleen Newport, MD   Note: This note was generated in part or whole with voice recognition software. Voice recognition is usually quite accurate but there are transcription errors that can and very often do occur. I apologize for any typographical errors that were not detected and corrected.     Earleen Newport, MD 04/18/16 [phone]

## 2016-04-18 NOTE — ED Triage Notes (Signed)
Pt. States upper body generalized pain that radiates down both arms.  Pt. States nausea without vomiting.  Pt. States diarrhea for the past two days.

## 2016-04-19 ENCOUNTER — Encounter: Payer: Self-pay | Admitting: *Deleted

## 2016-04-19 ENCOUNTER — Emergency Department
Admission: EM | Admit: 2016-04-19 | Discharge: 2016-04-20 | Disposition: A | Discharge status: Home / Self Care | Payer: Medicare Other | Source: Home / Self Care | Attending: Emergency Medicine | Admitting: Emergency Medicine

## 2016-04-19 DIAGNOSIS — Z794 Long term (current) use of insulin: Secondary | ICD-10-CM | POA: Insufficient documentation

## 2016-04-19 DIAGNOSIS — R509 Fever, unspecified: Secondary | ICD-10-CM

## 2016-04-19 DIAGNOSIS — R233 Spontaneous ecchymoses: Secondary | ICD-10-CM

## 2016-04-19 DIAGNOSIS — Z79899 Other long term (current) drug therapy: Secondary | ICD-10-CM

## 2016-04-19 DIAGNOSIS — I10 Essential (primary) hypertension: Secondary | ICD-10-CM | POA: Insufficient documentation

## 2016-04-19 DIAGNOSIS — Z87891 Personal history of nicotine dependence: Secondary | ICD-10-CM

## 2016-04-19 DIAGNOSIS — E119 Type 2 diabetes mellitus without complications: Secondary | ICD-10-CM

## 2016-04-19 DIAGNOSIS — E876 Hypokalemia: Secondary | ICD-10-CM | POA: Insufficient documentation

## 2016-04-19 LAB — CBC WITH DIFFERENTIAL/PLATELET
Basophils Absolute: 0 10*3/uL (ref 0–0.1)
Basophils Relative: 0 %
Eosinophils Absolute: 0.2 10*3/uL (ref 0–0.7)
Eosinophils Relative: 2 %
HCT: 34.7 % — ABNORMAL LOW (ref 35.0–47.0)
Hemoglobin: 12.1 g/dL (ref 12.0–16.0)
Lymphocytes Relative: 21 %
Lymphs Abs: 2 10*3/uL (ref 1.0–3.6)
MCH: 31.9 pg (ref 26.0–34.0)
MCHC: 34.9 g/dL (ref 32.0–36.0)
MCV: 91.2 fL (ref 80.0–100.0)
Monocytes Absolute: 0.5 10*3/uL (ref 0.2–0.9)
Monocytes Relative: 5 %
Neutro Abs: 6.7 10*3/uL — ABNORMAL HIGH (ref 1.4–6.5)
Neutrophils Relative %: 72 %
Platelets: 176 10*3/uL (ref 150–440)
RBC: 3.8 MIL/uL (ref 3.80–5.20)
RDW: 14.1 % (ref 11.5–14.5)
WBC: 9.3 10*3/uL (ref 3.6–11.0)

## 2016-04-19 LAB — COMPREHENSIVE METABOLIC PANEL
ALT: 40 U/L (ref 14–54)
AST: 39 U/L (ref 15–41)
Albumin: 4.3 g/dL (ref 3.5–5.0)
Alkaline Phosphatase: 124 U/L (ref 38–126)
Anion gap: 9 (ref 5–15)
BUN: 7 mg/dL (ref 6–20)
CO2: 26 mmol/L (ref 22–32)
Calcium: 7.6 mg/dL — ABNORMAL LOW (ref 8.9–10.3)
Chloride: 105 mmol/L (ref 101–111)
Creatinine, Ser: 0.68 mg/dL (ref 0.44–1.00)
GFR calc Af Amer: >60 mL/min (ref >60)
GFR calc non Af Amer: >60 mL/min (ref >60)
Glucose, Bld: 153 mg/dL — ABNORMAL HIGH (ref 65–99)
Potassium: 2.6 mmol/L — CL (ref 3.5–5.1)
Sodium: 140 mmol/L (ref 135–145)
Total Bilirubin: 3.9 mg/dL — ABNORMAL HIGH (ref 0.3–1.2)
Total Protein: 6.9 g/dL (ref 6.5–8.1)

## 2016-04-19 LAB — LACTIC ACID, PLASMA: Lactic Acid, Venous: 1.3 mmol/L (ref 0.5–1.9)

## 2016-04-19 LAB — APTT: aPTT: 29 seconds (ref 24–36)

## 2016-04-19 LAB — PROTIME-INR
INR: 1.21
Prothrombin Time: 15.4 seconds — ABNORMAL HIGH (ref 11.4–15.2)

## 2016-04-19 MED ORDER — SODIUM CHLORIDE 0.9 % IV BOLUS (SEPSIS)
1000.0000 mL | Freq: Once | INTRAVENOUS | Status: AC
  Administered 2016-04-19: 1000 mL via INTRAVENOUS

## 2016-04-19 MED ORDER — OXYCODONE HCL 5 MG PO TABS: 5.0000 mg | ORAL_TABLET | Freq: Three times a day (TID) | ORAL | 0 refills | Status: DC | PRN

## 2016-04-19 MED ORDER — CEFTRIAXONE SODIUM 2 G IJ SOLR: 2.0000 g | Freq: Once | INTRAMUSCULAR | Status: DC

## 2016-04-19 MED ORDER — POTASSIUM CHLORIDE CRYS ER 20 MEQ PO TBCR: 20.0000 meq | EXTENDED_RELEASE_TABLET | Freq: Two times a day (BID) | ORAL | 0 refills | Status: DC

## 2016-04-19 MED ORDER — OXYCODONE HCL 5 MG PO TABS
5.0000 mg | ORAL_TABLET | Freq: Once | ORAL | Status: AC
  Administered 2016-04-19: 5 mg via ORAL
  Filled 2016-04-19: qty 1

## 2016-04-19 MED ORDER — CEFTRIAXONE SODIUM-DEXTROSE 2-2.22 GM-% IV SOLR
2.0000 g | INTRAVENOUS | Status: AC
  Administered 2016-04-19: 2 g via INTRAVENOUS
  Filled 2016-04-19: qty 50

## 2016-04-19 MED ORDER — POTASSIUM CHLORIDE CRYS ER 20 MEQ PO TBCR
40.0000 meq | EXTENDED_RELEASE_TABLET | Freq: Once | ORAL | Status: AC
  Administered 2016-04-19: 40 meq via ORAL
  Filled 2016-04-19: qty 2

## 2016-04-19 MED ORDER — SODIUM CHLORIDE 0.9 % IV SOLN
30.0000 meq | Freq: Once | INTRAVENOUS | Status: AC
  Administered 2016-04-19: 30 meq via INTRAVENOUS
  Filled 2016-04-19: qty 15

## 2016-04-19 NOTE — Discharge Instructions (Signed)
Return to the ER if you have worsening fever, headache, neck stiffness, worsening rash, difficulty breathing, or any new symptoms that are concerning to you. Otherwise make sure to call Dr. Blane Ohara office tomorrow first thing in the morning for close follow-up tomorrow.

## 2016-04-19 NOTE — ED Notes (Signed)
Attempted to call pharmacy to send rocephin and potassium - pt only has one IV and per Dr Alfred Levins run the rocephin first and then the potassium

## 2016-04-19 NOTE — ED Provider Notes (Signed)
[facility] Emergency Department Provider Note  ____________________________________________  Time seen: Approximately 7:05 PM  I have reviewed the triage vital signs and the nursing notes.   HISTORY  Chief Complaint Rash and Leg Swelling   HPI Amber Christensen is a 51 y.o. female with h/o NASH cirrhosis, DM, HTN who presents for evaluation of rash. Patient was seen here yesterday for myalgias with a negative workup and was discharged home. Today she woke up and noticed a rash in her extremities. She has a rash in bilateral lower extremities and bilateral upper extremities that is painful. She said initially was pruritic but now she is painful. She reports that her pain is 10 out of 10, burning and sharp, worse when she ambulates or touches the rash. She reports that she has had a similar episode many years ago and at that time and nobody can figure it out what was wrong with her. She denies any new medications. She denies involvement of her mucous membranes. She denies fever or chills, neck stiffness, headache, abdominal pain, chest pain, shortness of breath, cough. She hasn't taken anything at home for the pain. No new meds, no recent travel.  Past Medical History:  Diagnosis Date  . Diabetes mellitus without complication ([place])   . GERD (gastroesophageal reflux disease)   . Hypertension   . Liver disease   . Pancreatitis     Patient Active Problem List   Diagnosis Date Noted  . Unstable angina ([place]) 09/28/2015    Past Surgical History:  Procedure Laterality Date  . ABDOMINAL HYSTERECTOMY    . APPENDECTOMY    . BLADDER SURGERY    . BOWEL RESECTION    . BREAST BIOPSY Left 1989  . CHOLECYSTECTOMY    . CYST REMOVAL HAND Left 1992   Ganglion cyst removed from Left Wrist  . DILATATION & CURETTAGE/HYSTEROSCOPY WITH MYOSURE    . KNEE ARTHROSCOPY Left 1992  . PANCREAS SURGERY    . REPAIR OF ESOPHAGUS  2012   3 clamps placed in esophagus    Prior to  Admission medications   Medication Sig Start Date End Date Taking? Authorizing Provider  amLODipine (NORVASC) 5 MG tablet Take 1 tablet (5 mg total) by mouth daily. 09/29/15  Yes Henreitta Leber, MD  aspirin EC 81 MG tablet Take 81 mg by mouth daily.   Yes Historical Provider, MD  insulin lispro (HUMALOG) 100 UNIT/ML injection Inject 0.04 mLs (4 Units total) into the skin 3 (three) times daily with meals. Pt uses based off the amount of carbs she eats at each meal. Patient taking differently: Inject 2-9 Units into the skin 3 (three) times daily with meals. Pt uses based off the amount of carbs she eats at each meal. 09/29/15  Yes Henreitta Leber, MD  lisinopril (PRINIVIL,ZESTRIL) 20 MG tablet Take 1 tablet (20 mg total) by mouth daily. Patient taking differently: Take 40 mg by mouth daily.  09/29/15  Yes Henreitta Leber, MD  Magnesium Oxide 500 MG TABS Take 250 mg by mouth 3 (three) times daily.    Yes Historical Provider, MD  oxyCODONE (ROXICODONE) 5 MG immediate release tablet Take 1 tablet (5 mg total) by mouth every 8 (eight) hours as needed. 10/21/15 10/20/16 Yes Earleen Newport, MD  pantoprazole (PROTONIX) 40 MG tablet Take 1 tablet (40 mg total) by mouth 2 (two) times daily. 09/29/15  Yes Henreitta Leber, MD  potassium gluconate 595 (99 K) MG TABS tablet Take 595 mg by  mouth daily.   Yes Historical Provider, MD  promethazine (PHENERGAN) 12.5 MG tablet Take 1 tablet (12.5 mg total) by mouth every 6 (six) hours as needed for nausea or vomiting. 04/06/16  Yes Merlyn Lot, MD  vitamin E 400 UNIT capsule Take 400 Units by mouth daily.   Yes Historical Provider, MD  amoxicillin (AMOXIL) 500 MG tablet Take 1 tablet (500 mg total) by mouth 3 (three) times daily. Patient not taking: Reported on 04/19/2016 03/27/16   Victorino Dike, FNP  cloNIDine (CATAPRES) 0.1 MG tablet Take 1 tablet (0.1 mg total) by mouth 2 (two) times daily. Patient not taking: Reported on 02/24/2016 09/29/15   Henreitta Leber, MD    diazepam (VALIUM) 5 MG tablet Take 1 tablet (5 mg total) by mouth every 8 (eight) hours as needed for muscle spasms. 04/18/16   Earleen Newport, MD  gabapentin (NEURONTIN) 300 MG capsule Take 1 capsule (300 mg total) by mouth 3 (three) times daily. Patient not taking: Reported on 04/19/2016 12/10/15 12/09/16  Carrie Mew, MD  insulin glargine (LANTUS) 100 UNIT/ML injection Inject 0.12 mLs (12 Units total) into the skin at bedtime. Patient not taking: Reported on 04/19/2016 09/29/15   Henreitta Leber, MD  lipase/protease/amylase (CREON) 12000 units CPEP capsule Take 1 capsule (12,000 Units total) by mouth 4 (four) times daily -  with meals and at bedtime. Patient not taking: Reported on 02/24/2016 09/29/15   Henreitta Leber, MD  metoprolol tartrate (LOPRESSOR) 25 MG tablet Take 1 tablet (25 mg total) by mouth 2 (two) times daily. Patient not taking: Reported on 04/19/2016 04/06/16 04/06/17  Merlyn Lot, MD  oxyCODONE (ROXICODONE) 5 MG immediate release tablet Take 1 tablet (5 mg total) by mouth every 8 (eight) hours as needed. 04/19/16 04/19/17  Rudene Re, MD    Allergies Demerol [meperidine]; Dilaudid [hydromorphone hcl]; Morphine and related; Darvon [propoxyphene]; Flexeril [cyclobenzaprine]; Levaquin [levofloxacin in d5w]; Soma [carisoprodol]; and Zofran [ondansetron hcl]  No family history on file.  Social History Social History  Substance Use Topics  . Smoking status: Former Smoker    Types: Cigarettes  . Smokeless tobacco: Former Systems developer  . Alcohol use No    Review of Systems  Constitutional: Negative for fever. Eyes: Negative for visual changes. ENT: Negative for sore throat. Neck: No neck pain  Cardiovascular: Negative for chest pain. Respiratory: Negative for shortness of breath. Gastrointestinal: Negative for abdominal pain, vomiting or diarrhea. Genitourinary: Negative for dysuria. Musculoskeletal: Negative for back pain. Skin: + rash in extremities Neurological:  Negative for headaches, weakness or numbness. Psych: No SI or HI  ____________________________________________   PHYSICAL EXAM:  VITAL SIGNS: ED Triage Vitals  Enc Vitals Group     BP 04/19/16 1820 (!) 146/79     Pulse Rate 04/19/16 1820 (!) 107     Resp 04/19/16 1820 20     Temp 04/19/16 1820 99.2 F (37.3 C)     Temp src --      SpO2 04/19/16 1820 99 %     Weight 04/19/16 1818 124 lb (56.2 kg)     Height 04/19/16 1818 5' 4"  (1.626 m)     Head Circumference --      Peak Flow --      Pain Score 04/19/16 1818 10     Pain Loc --      Pain Edu? --      Excl. in [place]? --     Constitutional: Alert and oriented. Well appearing and in no apparent  distress. HEENT:      Head: Normocephalic and atraumatic.         Eyes: Conjunctivae are normal. Sclera is non-icteric. EOMI. PERRL      Mouth/Throat: Mucous membranes are moist.       Neck: Supple with no signs of meningismus. Cardiovascular: Tachycardic with regular rhythm. No murmurs, gallops, or rubs. 2+ symmetrical distal pulses are present in all extremities. No JVD. Respiratory: Normal respiratory effort. Lungs are clear to auscultation bilaterally. No wheezes, crackles, or rhonchi.  Gastrointestinal: Soft, non tender, and non distended with positive bowel sounds. No rebound or guarding. Musculoskeletal: Nontender with normal range of motion in all extremities. No edema, cyanosis, or erythema of extremities. Neurologic: Normal speech and language. Face is symmetric. Moving all extremities. No gross focal neurologic deficits are appreciated. Skin: there is petechial rash located in her b/l LE circumferential worse on the RLE, peripheral lesions are blanching. There is also similar petechial rash symmetric in the  [place] aspect of b/l forearms with blanching peripheral lesions, the rash is tender to palpation. The rash is palpable in the UE but macular on b/l LE Psychiatric: Mood and affect are normal. Speech and behavior are  normal.  ____________________________________________   LABS (all labs ordered are listed, but only abnormal results are displayed)  Labs Reviewed  CBC WITH DIFFERENTIAL/PLATELET - Abnormal; Notable for the following:       Result Value   HCT 34.7 (*)    Neutro Abs 6.7 (*)    All other components within normal limits  COMPREHENSIVE METABOLIC PANEL - Abnormal; Notable for the following:    Potassium 2.6 (*)    Glucose, Bld 153 (*)    Calcium 7.6 (*)    Total Bilirubin 3.9 (*)    All other components within normal limits  PROTIME-INR - Abnormal; Notable for the following:    Prothrombin Time 15.4 (*)    All other components within normal limits  CULTURE, BLOOD (ROUTINE X 2)  CULTURE, BLOOD (ROUTINE X 2)  LACTIC ACID, PLASMA  APTT  HUMAN PARVOVIRUS DNA DETECTION BY PCR   ____________________________________________  EKG  none ____________________________________________  RADIOLOGY  none  ____________________________________________   PROCEDURES  Procedure(s) performed: None Procedures Critical Care performed:  None ____________________________________________   INITIAL IMPRESSION / ASSESSMENT AND PLAN / ED COURSE  51 y.o. female with h/o NASH cirrhosis, DM, HTN who presents for evaluation of petechial rash since this am located in all 4 extremities. Patient with low grade fever here of 100.14F, also tachycardic. No meningeal signs, negative jolt accentuation maneuver. Rash as described above. No mucous membrane involvement. Patient is well appearing. Will check labs, including clotting factors, platelets, WBC, lactic acid, kidney function.    _________________________ 8:26 PM on 04/19/2016 ----------------------------------------- Labs and no acute findings, normal white count, normal platelets, normal coags. Blood cultures have been sent. Patient remains extremely well appearing. I spoke with Dr. 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 He asked that I sent PCR for parvovirus B19, give a dose of rocephin until blood cultures are back and if she remains well appearing, dc home with close f/u with him tomorrow. Patient is comfortable with the plan. She is hypokalemic, will supplement K.    Pertinent labs & imaging results that were available during my care of the patient were reviewed by me and considered  in my medical decision making (see chart for details).    ____________________________________________   FINAL CLINICAL IMPRESSION(S) / ED DIAGNOSES  Final diagnoses:  Petechial rash  Fever, unspecified fever cause  Hypokalemia      NEW MEDICATIONS STARTED DURING THIS VISIT:  New Prescriptions   OXYCODONE (ROXICODONE) 5 MG IMMEDIATE RELEASE TABLET    Take 1 tablet (5 mg total) by mouth every 8 (eight) hours as needed.     Note:  This document was prepared using Dragon voice recognition software and may include unintentional dictation errors.    Rudene Re, MD 04/19/16 2029

## 2016-04-19 NOTE — ED Notes (Signed)
Pt was just seen in ER yesterday for weakness and pain in arms/neck - pt continues with the same pain - she says she was told yesterday that she was having a panic attack and that she is not happy with that diagnosis - she went to [facility] walk-in and they sent her to the er - pt has whelps/rash on bilat arms and bilat lower ext - she reports that she is unable to bear weight on her right leg and the right lower leg is swollen and warm to touch

## 2016-04-19 NOTE — ED Notes (Signed)
Dr Alfred Levins notified of critical potassium

## 2016-04-19 NOTE — ED Notes (Signed)
Talked to pharmacy and they will send rocephin and potassium

## 2016-04-19 NOTE — ED Triage Notes (Signed)
Pt was seen in ED yesterday for weakness and hypertension, pt has a red raised rash on bilateral legs and arms, pt has edema to right leg

## 2016-04-20 ENCOUNTER — Other Ambulatory Visit: Payer: Self-pay | Admitting: Infectious Diseases

## 2016-04-20 ENCOUNTER — Encounter (INDEPENDENT_AMBULATORY_CARE_PROVIDER_SITE_OTHER): Payer: Self-pay

## 2016-04-20 ENCOUNTER — Ambulatory Visit
Admission: RE | Admit: 2016-04-20 | Discharge: 2016-04-20 | Disposition: A | Discharge status: Home / Self Care | Payer: Medicare Other | Source: Ambulatory Visit | Attending: Infectious Diseases | Admitting: Infectious Diseases

## 2016-04-20 ENCOUNTER — Inpatient Hospital Stay
Admission: AD | Admit: 2016-04-20 | Discharge: 2016-04-22 | DRG: 607 | Disposition: A | Discharge status: Home / Self Care | Payer: Medicare Other | Source: Ambulatory Visit | Attending: Internal Medicine | Admitting: Internal Medicine

## 2016-04-20 DIAGNOSIS — E876 Hypokalemia: Secondary | ICD-10-CM | POA: Diagnosis present

## 2016-04-20 DIAGNOSIS — Z90411 Acquired partial absence of pancreas: Secondary | ICD-10-CM | POA: Diagnosis not present

## 2016-04-20 DIAGNOSIS — E1365 Other specified diabetes mellitus with hyperglycemia: Secondary | ICD-10-CM | POA: Diagnosis present

## 2016-04-20 DIAGNOSIS — Z885 Allergy status to narcotic agent status: Secondary | ICD-10-CM

## 2016-04-20 DIAGNOSIS — M7989 Other specified soft tissue disorders: Secondary | ICD-10-CM

## 2016-04-20 DIAGNOSIS — Z87891 Personal history of nicotine dependence: Secondary | ICD-10-CM

## 2016-04-20 DIAGNOSIS — Z91040 Latex allergy status: Secondary | ICD-10-CM | POA: Diagnosis not present

## 2016-04-20 DIAGNOSIS — K219 Gastro-esophageal reflux disease without esophagitis: Secondary | ICD-10-CM | POA: Diagnosis present

## 2016-04-20 DIAGNOSIS — Z7982 Long term (current) use of aspirin: Secondary | ICD-10-CM | POA: Diagnosis not present

## 2016-04-20 DIAGNOSIS — Z79899 Other long term (current) drug therapy: Secondary | ICD-10-CM

## 2016-04-20 DIAGNOSIS — R000 Tachycardia, unspecified: Secondary | ICD-10-CM | POA: Diagnosis present

## 2016-04-20 DIAGNOSIS — L958 Other vasculitis limited to the skin: Principal | ICD-10-CM | POA: Diagnosis present

## 2016-04-20 DIAGNOSIS — Z794 Long term (current) use of insulin: Secondary | ICD-10-CM

## 2016-04-20 DIAGNOSIS — I10 Essential (primary) hypertension: Secondary | ICD-10-CM | POA: Diagnosis present

## 2016-04-20 DIAGNOSIS — Z881 Allergy status to other antibiotic agents status: Secondary | ICD-10-CM | POA: Diagnosis not present

## 2016-04-20 DIAGNOSIS — Z888 Allergy status to other drugs, medicaments and biological substances status: Secondary | ICD-10-CM

## 2016-04-20 DIAGNOSIS — E1165 Type 2 diabetes mellitus with hyperglycemia: Secondary | ICD-10-CM | POA: Diagnosis present

## 2016-04-20 DIAGNOSIS — R21 Rash and other nonspecific skin eruption: Secondary | ICD-10-CM | POA: Diagnosis present

## 2016-04-20 DIAGNOSIS — K7581 Nonalcoholic steatohepatitis (NASH): Secondary | ICD-10-CM | POA: Diagnosis present

## 2016-04-20 LAB — RAPID HIV SCREEN (HIV 1/2 AB+AG)
HIV 1/2 Antibodies: NONREACTIVE
HIV-1 P24 Antigen - HIV24: NONREACTIVE

## 2016-04-20 LAB — GLUCOSE, CAPILLARY
Glucose-Capillary: 348 mg/dL — ABNORMAL HIGH (ref 65–99)
Glucose-Capillary: 355 mg/dL — ABNORMAL HIGH (ref 65–99)

## 2016-04-20 MED ORDER — INSULIN ASPART 100 UNIT/ML ~~LOC~~ SOLN
0.0000 [IU] | Freq: Three times a day (TID) | SUBCUTANEOUS | Status: DC
  Administered 2016-04-20 – 2016-04-21 (×2): 7 [IU] via SUBCUTANEOUS
  Administered 2016-04-21 (×2): 5 [IU] via SUBCUTANEOUS
  Administered 2016-04-22 (×2): 2 [IU] via SUBCUTANEOUS
  Filled 2016-04-20: qty 2
  Filled 2016-04-20: qty 7
  Filled 2016-04-20: qty 2
  Filled 2016-04-20 (×2): qty 5
  Filled 2016-04-20: qty 7

## 2016-04-20 MED ORDER — ACETAMINOPHEN 325 MG PO TABS: 650.0000 mg | ORAL_TABLET | Freq: Four times a day (QID) | ORAL | Status: DC | PRN

## 2016-04-20 MED ORDER — PREDNISONE 50 MG PO TABS
50.0000 mg | ORAL_TABLET | Freq: Every day | ORAL | Status: DC
  Administered 2016-04-21 – 2016-04-22 (×2): 50 mg via ORAL
  Filled 2016-04-20 (×2): qty 1

## 2016-04-20 MED ORDER — POTASSIUM CHLORIDE CRYS ER 20 MEQ PO TBCR
20.0000 meq | EXTENDED_RELEASE_TABLET | Freq: Two times a day (BID) | ORAL | Status: DC
  Administered 2016-04-20 – 2016-04-22 (×4): 20 meq via ORAL
  Filled 2016-04-20 (×4): qty 1

## 2016-04-20 MED ORDER — VITAMIN E 180 MG (400 UNIT) PO CAPS
400.0000 [IU] | ORAL_CAPSULE | Freq: Every day | ORAL | Status: DC
  Administered 2016-04-21 – 2016-04-22 (×2): 400 [IU] via ORAL
  Filled 2016-04-20 (×2): qty 1

## 2016-04-20 MED ORDER — ENOXAPARIN SODIUM 40 MG/0.4ML ~~LOC~~ SOLN
40.0000 mg | SUBCUTANEOUS | Status: DC
  Administered 2016-04-20 – 2016-04-21 (×2): 40 mg via SUBCUTANEOUS
  Filled 2016-04-20 (×2): qty 0.4

## 2016-04-20 MED ORDER — PANTOPRAZOLE SODIUM 40 MG PO TBEC
40.0000 mg | DELAYED_RELEASE_TABLET | Freq: Two times a day (BID) | ORAL | Status: DC
  Administered 2016-04-20 – 2016-04-22 (×4): 40 mg via ORAL
  Filled 2016-04-20 (×4): qty 1

## 2016-04-20 MED ORDER — DOXYCYCLINE HYCLATE 100 MG PO TABS
100.0000 mg | ORAL_TABLET | Freq: Two times a day (BID) | ORAL | Status: DC
  Administered 2016-04-20 – 2016-04-22 (×4): 100 mg via ORAL
  Filled 2016-04-20 (×4): qty 1

## 2016-04-20 MED ORDER — DIPHENHYDRAMINE HCL 25 MG PO CAPS
25.0000 mg | ORAL_CAPSULE | Freq: Four times a day (QID) | ORAL | Status: DC | PRN
  Administered 2016-04-20 – 2016-04-21 (×2): 25 mg via ORAL
  Filled 2016-04-20 (×2): qty 1

## 2016-04-20 MED ORDER — TRAMADOL HCL 50 MG PO TABS
50.0000 mg | ORAL_TABLET | Freq: Four times a day (QID) | ORAL | Status: DC | PRN
  Administered 2016-04-20 – 2016-04-22 (×4): 50 mg via ORAL
  Filled 2016-04-20 (×4): qty 1

## 2016-04-20 MED ORDER — LISINOPRIL 10 MG PO TABS
40.0000 mg | ORAL_TABLET | Freq: Every day | ORAL | Status: DC
  Administered 2016-04-21 – 2016-04-22 (×2): 40 mg via ORAL
  Filled 2016-04-20 (×2): qty 4

## 2016-04-20 MED ORDER — SENNOSIDES-DOCUSATE SODIUM 8.6-50 MG PO TABS: 1.0000 | ORAL_TABLET | Freq: Every evening | ORAL | Status: DC | PRN

## 2016-04-20 MED ORDER — AMLODIPINE BESYLATE 5 MG PO TABS
5.0000 mg | ORAL_TABLET | Freq: Every day | ORAL | Status: DC
  Administered 2016-04-21 – 2016-04-22 (×2): 5 mg via ORAL
  Filled 2016-04-20 (×2): qty 1

## 2016-04-20 MED ORDER — OXYCODONE HCL 5 MG PO TABS
5.0000 mg | ORAL_TABLET | Freq: Three times a day (TID) | ORAL | Status: DC | PRN
  Administered 2016-04-20 – 2016-04-21 (×3): 5 mg via ORAL
  Filled 2016-04-20 (×4): qty 1

## 2016-04-20 MED ORDER — ASPIRIN EC 81 MG PO TBEC
81.0000 mg | DELAYED_RELEASE_TABLET | Freq: Every day | ORAL | Status: DC
  Administered 2016-04-21 – 2016-04-22 (×2): 81 mg via ORAL
  Filled 2016-04-20 (×2): qty 1

## 2016-04-20 MED ORDER — MAGNESIUM OXIDE 400 (241.3 MG) MG PO TABS
400.0000 mg | ORAL_TABLET | Freq: Three times a day (TID) | ORAL | Status: DC
  Administered 2016-04-20 – 2016-04-22 (×5): 400 mg via ORAL
  Filled 2016-04-20 (×5): qty 1

## 2016-04-20 MED ORDER — BISACODYL 5 MG PO TBEC: 5.0000 mg | DELAYED_RELEASE_TABLET | Freq: Every day | ORAL | Status: DC | PRN

## 2016-04-20 MED ORDER — ACETAMINOPHEN 650 MG RE SUPP: 650.0000 mg | Freq: Four times a day (QID) | RECTAL | Status: DC | PRN

## 2016-04-20 NOTE — H&P (Addendum)
[place] [facility] [place] NAME: Amber Christensen    MR#:  [id number]  DATE OF BIRTH:  April 26, 1965  DATE OF ADMISSION:  04/20/2016  PRIMARY CARE PHYSICIAN: Singh,Jasmine, MD   REQUESTING/REFERRING PHYSICIAN: Dr fitzgerald  CHIEF COMPLAINT:   Painful rash on both upper and lower extremity which is now spreading to the chest and the back. HISTORY OF PRESENT ILLNESS:  Amber Christensen  is a 51 y.o. 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 Patient's workup was done in the emergency room the first time sent home came back again with rash both upper and lower extremity around the ankle since yesterday. Patient denies any tick bite, travel, trying a new food laundry detergent or any fragrance. Denies any such kind rash in the past. She was seen again in the emergency room yesterday given a dose of IV Rocephin after blood cultures were drawn. She went to see infectious disease Dr. Ola Spurr this morning her rash was progressed to her chest and back. Given the rapidly progressive  rash patient is being admitted for further evaluation management.  Patient also developed severe leg swelling both the lower extremities and complains of pain in her feet unable to bear weight on it. Denies any lesions or blisters in her mouth or perineal area. Pt denies any cough hemoptysis fever or chills shortness of breath  PAStT MEDICAL HISTORY:   Past Medical History:  Diagnosis Date  . Diabetes mellitus without complication ([place])   . GERD (gastroesophageal reflux disease)   . Hypertension   . Liver disease   . Pancreatitis     PAST SURGICAL HISTOIRY:   Past Surgical History:  Procedure  Laterality Date  . ABDOMINAL HYSTERECTOMY    . APPENDECTOMY    . BLADDER SURGERY    . BOWEL RESECTION    . BREAST BIOPSY Left 1989  . CHOLECYSTECTOMY    . CYST REMOVAL HAND Left 1992   Ganglion cyst removed from Left Wrist  . DILATATION & CURETTAGE/HYSTEROSCOPY WITH MYOSURE    . KNEE ARTHROSCOPY Left 1992  . PANCREAS SURGERY    . REPAIR OF ESOPHAGUS  2012   3 clamps placed in esophagus    SOCIAL HISTORY:   Social History  Substance Use Topics  . Smoking status: Former Smoker    Types: Cigarettes  . Smokeless tobacco: Former Systems developer  . Alcohol use No    FAMILY HISTORY:  History reviewed. No pertinent family history.  DRUG ALLERGIES:   Allergies  Allergen Reactions  . Demerol [Meperidine] Other (See Comments)    Pt states that this medication causes cardiac arrest.    . Dilaudid [Hydromorphone Hcl] Other (See Comments)    Pt states that this medication causes cardiac arrest.    . Morphine And Related Other (See Comments)    Pt states that this medication causes cardiac arrest.    . Darvon [Propoxyphene] Nausea And Vomiting and Rash  . Flexeril [Cyclobenzaprine] Nausea And Vomiting and Rash  . Levaquin [Levofloxacin In D5w] Nausea And Vomiting and Rash  . Soma [Carisoprodol] Nausea And Vomiting and Rash  . Zofran [Ondansetron Hcl] Nausea And Vomiting and Rash    REVIEW OF SYSTEMS:  Review of Systems  Constitutional: Positive for malaise/fatigue.  Negative for chills, fever and weight loss.  HENT: Negative for ear discharge, ear pain and nosebleeds.   Eyes: Negative for blurred vision, pain and discharge.  Respiratory: Negative for sputum production, shortness of breath, wheezing and stridor.   Cardiovascular: Negative for chest pain, palpitations, orthopnea and PND.  Gastrointestinal: Negative for abdominal pain, diarrhea, nausea and vomiting.  Genitourinary: Negative for frequency and urgency.  Musculoskeletal: Positive for back pain, joint pain, myalgias and neck  pain.  Skin: Positive for rash.  Neurological: Positive for weakness. Negative for sensory change, speech change and focal weakness.  Psychiatric/Behavioral: Negative for depression and hallucinations. The patient is not nervous/anxious.      MEDICATIONS AT HOME:   Prior to Admission medications   Medication Sig Start Date End Date Taking? Authorizing Provider  amLODipine (NORVASC) 5 MG tablet Take 1 tablet (5 mg total) by mouth daily. 09/29/15  Yes Henreitta Leber, MD  aspirin EC 81 MG tablet Take 81 mg by mouth daily.   Yes Historical Provider, MD  lisinopril (PRINIVIL,ZESTRIL) 20 MG tablet Take 1 tablet (20 mg total) by mouth daily. Patient taking differently: Take 40 mg by mouth daily.  09/29/15  Yes Henreitta Leber, MD  Magnesium Oxide 500 MG TABS Take 250 mg by mouth 3 (three) times daily.    Yes Historical Provider, MD  pantoprazole (PROTONIX) 40 MG tablet Take 1 tablet (40 mg total) by mouth 2 (two) times daily. 09/29/15  Yes Henreitta Leber, MD  potassium gluconate 595 (99 K) MG TABS tablet Take 595 mg by mouth daily.   Yes Historical Provider, MD  vitamin E 400 UNIT capsule Take 400 Units by mouth daily.   Yes Historical Provider, MD  insulin lispro (HUMALOG) 100 UNIT/ML injection Inject 0.04 mLs (4 Units total) into the skin 3 (three) times daily with meals. Pt uses based off the amount of carbs she eats at each meal. Patient taking differently: Inject 2-9 Units into the skin 3 (three) times daily with meals. Pt uses based off the amount of carbs she eats at each meal. 09/29/15   Henreitta Leber, MD  oxyCODONE (ROXICODONE) 5 MG immediate release tablet Take 1 tablet (5 mg total) by mouth every 8 (eight) hours as needed. 10/21/15 10/20/16  Earleen Newport, MD  oxyCODONE (ROXICODONE) 5 MG immediate release tablet Take 1 tablet (5 mg total) by mouth every 8 (eight) hours as needed. 04/19/16 04/19/17  Harvest Dark, MD  potassium chloride SA (K-DUR,KLOR-CON) 20 MEQ tablet Take 1 tablet (20  mEq total) by mouth 2 (two) times daily. 04/19/16   Harvest Dark, MD  promethazine (PHENERGAN) 12.5 MG tablet Take 1 tablet (12.5 mg total) by mouth every 6 (six) hours as needed for nausea or vomiting. 04/06/16   Merlyn Lot, MD      VITAL SIGNS:  Pulse 95, temperature 98.5 F (36.9 C), resp. rate 12, height _0  (1.626 m), weight 56.2 kg (123 lb 14.4 oz), SpO2 100 %.  PHYSICAL EXAMINATION:  GENERAL:  51 y.o.-year-old patient lying in the bed with no acute distress.  EYES: Pupils equal, round, reactive to light and accommodation. No scleral icterus. Extraocular muscles intact.  HEENT: Head atraumatic, normocephalic. Oropharynx and nasopharynx clear.  NECK:  Supple, no jugular venous distention. No thyroid enlargement, no tenderness.  LUNGS: Normal breath sounds bilaterally, no wheezing, rales,rhonchi or crepitation. No use of accessory muscles of respiration.  CARDIOVASCULAR: S1, S2 normal. No murmurs, rubs, or gallops.  ABDOMEN: Soft, nontender, nondistended. Bowel  sounds present. No organomegaly or mass.  EXTREMITIES:++ pedal edema, no cyanosis, or clubbing.  NEUROLOGIC: Cranial nerves II through XII are intact. Muscle strength 5/5 in all extremities. Sensation intact. Gait not checked.  PSYCHIATRIC: The patient is alert and oriented x 3.  SKIN:Macular rash faint pink both upper and lower extremity in the forearm and around the ankle bandlike Erythematous rash on the chest and back CBC  Recent Labs Lab 04/19/16 1905  WBC 9.3  HGB 12.1  HCT 34.7*  PLT 176   ------------------------------------------------------------------------------------------------------------------  Chemistries   Recent Labs Lab 04/19/16 1905  NA 140  K 2.6*  CL 105  CO2 26  GLUCOSE 153*  BUN 7  CREATININE 0.68  CALCIUM 7.6*  AST 39  ALT 40  ALKPHOS 124  BILITOT 3.9*    ------------------------------------------------------------------------------------------------------------------  Cardiac Enzymes  Recent Labs Lab 04/18/16 0653  TROPONINI <0.03   ------------------------------------------------------------------------------------------------------------------  RADIOLOGY:  US Venous Img Lower Unilateral Right  Result Date: 04/20/2016 CLINICAL DATA:  Right leg swelling for a day. EXAM: Right LOWER EXTREMITY VENOUS DOPPLER ULTRASOUND TECHNIQUE: Gray-scale sonography with graded compression, as well as color Doppler and duplex ultrasound were performed to evaluate the lower extremity deep venous systems from the level of the common femoral vein and including the common femoral, femoral, profunda femoral, popliteal and calf veins including the posterior tibial, peroneal and gastrocnemius veins when visible. The superficial great saphenous vein was also interrogated. Spectral Doppler was utilized to evaluate flow at rest and with distal augmentation maneuvers in the common femoral, femoral and popliteal veins. COMPARISON:  None. FINDINGS: Contralateral Common Femoral Vein: Respiratory phasicity is normal and symmetric with the symptomatic side. No evidence of thrombus. Normal compressibility. Common Femoral Vein: No evidence of thrombus. Normal compressibility, respiratory phasicity and response to augmentation. Saphenofemoral Junction: No evidence of thrombus. Normal compressibility and flow on color Doppler imaging. Profunda Femoral Vein: No evidence of thrombus. Normal compressibility and flow on color Doppler imaging. Femoral Vein: No evidence of thrombus. Normal compressibility, respiratory phasicity and response to augmentation. Popliteal Vein: No evidence of thrombus. Normal compressibility, respiratory phasicity and response to augmentation. Calf Veins: No evidence of thrombus. Normal compressibility and flow on color Doppler imaging. Superficial Great  Saphenous Vein: No evidence of thrombus. Normal compressibility and flow on color Doppler imaging. Venous Reflux:  None. Other Findings:  None. IMPRESSION: No evidence of deep venous thrombosis in the right lower extremity. Electronically Signed   By: Lorin Picket M.D.   On: 04/20/2016 12:32    EKG:    IMPRESSION AND PLAN:   Anaika Santillano  is a 50 y.o. 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 Patient's workup was done in the emergency room the first time sent home came back again with rash both upper and lower extremity around the ankle since yesterday. Patient denies any tick bite, travel, trying a new food laundry detergent or any fragrance. Denies any such kind rash in the past.  1. Acute onset maculopapular rash over the chest back both upper and lower extremity associated with lower extremity edema and painful bilateral lower extremity  -r/o vasculitis, viral exanthem, ?autoimmune disorder -Dermatology consult placed. Spoke with Dr. Donneta Romberg about it -Rheumatology consult with Dr Jefm Bryant -Oral prednisone 60 mg daily, start  patient on doxycycline empirically -When necessary Benadryl and ranitidine  -Follow-up ESR, ANA, CRP, parvovirus B 19 and [place] spotted fever test   2. Diabetes type 2  -Continue Lantus 12 units and Humalog sliding scale    3. Hypertension continue home meds   4. DVT prophylaxis subcutaneous Lovenox  No family members present. Above was discussed with patient and agreeable to it. Patient has a very hard stick. We will have PICC line team place midline for IV access.  All the records are reviewed and case discussed with ED provider. Management plans discussed with the  patient, family and they are in agreement.  CODE STATUS: FULL TOTAL TIME TAKING CARE OF THIS PATIENT: 50 minutes.    Abriella Filkins M.D on 04/20/2016 at 3:45 PM  Between 7am to 6pm - Pager - [phone]  After 6pm go to [email or web address] - password [email or web address] [facility]  [facility]  Office  [phone]  CC: Primary care physician; Glendon Axe, MD

## 2016-04-20 NOTE — Progress Notes (Signed)
Order received for midline placement. Consent obtained. [facility] called and stated they would send someone at 1630 for midline placement.

## 2016-04-20 NOTE — ED Notes (Signed)
Pt. Verbalizes understanding of d/c instructions, prescriptions, and follow-up. VS stable.Pt. In NAD at time of d/c and denies further concerns regarding this visit. Pt. Stable at the time of departure from the unit, departing unit by the safest and most appropriate manner per that pt condition and limitations. Pt advised to return to the ED at any time for emergent concerns, or for new/worsening symptoms.

## 2016-04-21 DIAGNOSIS — R21 Rash and other nonspecific skin eruption: Secondary | ICD-10-CM | POA: Diagnosis present

## 2016-04-21 LAB — GLUCOSE, CAPILLARY
Glucose-Capillary: 345 mg/dL — ABNORMAL HIGH (ref 65–99)
Glucose-Capillary: 261 mg/dL — ABNORMAL HIGH (ref 65–99)
Glucose-Capillary: 254 mg/dL — ABNORMAL HIGH (ref 65–99)

## 2016-04-21 LAB — ROCKY MTN SPOTTED FVR ABS PNL(IGG+IGM)
RMSF IgG: NEGATIVE
RMSF IgM: 0.34 index (ref 0.00–0.89)

## 2016-04-21 LAB — MAGNESIUM
Magnesium: 1.4 mg/dL — ABNORMAL LOW (ref 1.7–2.4)
Magnesium: 2.5 mg/dL — ABNORMAL HIGH (ref 1.7–2.4)

## 2016-04-21 LAB — POTASSIUM: Potassium: 3.9 mmol/L (ref 3.5–5.1)

## 2016-04-21 LAB — PARVOVIRUS B19 ANTIBODY, IGG AND IGM
Parovirus B19 IgG Abs: 0.3 index (ref 0.0–0.8)
Parovirus B19 IgM Abs: 0.1 index (ref 0.0–0.8)

## 2016-04-21 LAB — RPR: RPR Ser Ql: NONREACTIVE

## 2016-04-21 MED ORDER — INSULIN GLARGINE 100 UNIT/ML ~~LOC~~ SOLN
12.0000 [IU] | Freq: Every day | SUBCUTANEOUS | Status: DC
  Administered 2016-04-21: 12 [IU] via SUBCUTANEOUS
  Filled 2016-04-21 (×3): qty 0.12

## 2016-04-21 MED ORDER — OXYCODONE HCL 5 MG PO TABS
5.0000 mg | ORAL_TABLET | Freq: Three times a day (TID) | ORAL | Status: DC | PRN
  Administered 2016-04-21 – 2016-04-22 (×2): 5 mg via ORAL
  Filled 2016-04-21 (×2): qty 1

## 2016-04-21 MED ORDER — HYDROXYZINE HCL 25 MG PO TABS
25.0000 mg | ORAL_TABLET | Freq: Three times a day (TID) | ORAL | Status: DC | PRN
  Administered 2016-04-21: 25 mg via ORAL
  Filled 2016-04-21 (×2): qty 1

## 2016-04-21 MED ORDER — MAGNESIUM SULFATE 4 GM/100ML IV SOLN
4.0000 g | Freq: Once | INTRAVENOUS | Status: AC
  Administered 2016-04-21: 4 g via INTRAVENOUS
  Filled 2016-04-21: qty 100

## 2016-04-21 MED ORDER — INSULIN ASPART 100 UNIT/ML ~~LOC~~ SOLN
3.0000 [IU] | Freq: Three times a day (TID) | SUBCUTANEOUS | Status: DC
Start: 2016-04-21 — End: 2016-04-22
  Administered 2016-04-21 – 2016-04-22 (×4): 3 [IU] via SUBCUTANEOUS
  Filled 2016-04-21 (×4): qty 3

## 2016-04-21 MED ORDER — SODIUM CHLORIDE 0.9 % IV SOLN
30.0000 meq | Freq: Once | INTRAVENOUS | Status: AC
  Administered 2016-04-21: 30 meq via INTRAVENOUS
  Filled 2016-04-21: qty 15

## 2016-04-21 NOTE — Progress Notes (Signed)
Pt. Still itching after receiving benadryl. Dr. Jannifer Franklin notified and ordered hydralazine.

## 2016-04-21 NOTE — Consult Note (Signed)
CC:  The patient is a 51 year old woman seen in consultation at the request of Dr. Posey Pronto for evaluation of a rash.  HPI:  In short, the patient reports a one-day history of a painful rash with swelling involving the bilteral lower extremities. She was admitted for further evaluation.  No preceding acute illness.  No recent tick bites.  No recent increase in physical activity.  Past medical history is notable for chronic liver disease of uncertain origin, as well as repeated bouts of pancreatitis.  Review of systems:   Constitutional:  No recent fever.  No unintentional weight loss.  HEENT: No recent sore throat  Immunology: No known immunosuppression.  Physical Exam:   General: Well developed. Well nourished. Pleasant.  Skin:  Examination of the scalp, eyelids, face, neck, chest, back, abdomen, R upper extremity, L upper extremity, R lower extremity, L lower extremity performed and remarkable for:  --Mild blanchable erythema over the chest and anterior neck. 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  Leukocytoclastic vasculitis commonly presents with these sockline lesions.  LCV can be triggered by infection (commonly streptococcal in the case of [place] purpura), malignancy, or connective tissue disease.  Small vessel vasculopathy (as in RMSF) may also be considered.   After obtaining informed verbal consent.  Two lesional punch biopsies were performed.  One was submitted for H&E, the other for direct immunofluorescence.  I anticipate having results by Friday afternoon, and will contact the patient and Dr. Posey Pronto when available. The patient should have sutures removed in 2 weeks.  Bandage applied. Wound care discussed.

## 2016-04-21 NOTE — Progress Notes (Signed)
Initial Nutrition Assessment  DOCUMENTATION CODES:   Not applicable  INTERVENTION:  -Discussed small frequent meals with pt; pt usually eats 1/2 sandwich for snack (15 g CHO). Ordered snacks for pt. Discussed plan for snacks with diabetes coordinator on-call.   NUTRITION DIAGNOSIS:   Increased nutrient needs related to chronic illness as evidenced by estimated needs.  GOAL:   Patient will meet greater than or equal to 90% of their needs   MONITOR:   PO intake, Labs, Weight trends  REASON FOR ASSESSMENT:   Rounds    ASSESSMENT:    51 yo female admitted with acute onset maculopapular rash. 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 Pt has been gaining some weight on this regimen; MD discontinued her Boost supplementation for this reason pt reports she needs the added nutrition due to plan for liver transplant. Pt also reports there are plans for her to get insulin pump within the next month.   CBGz have been elevated but insulin regimen adjusted today and pt is on prednisone  Labs: CBGs 261-355, potassium 2.6 (supplemented), magnesium 1.4 Meds: lantus, novolog with meals, ss novolog, magox, potassium chloride, prednisone  Diet Order:  Diet Carb Modified Fluid consistency: Thin; Room service appropriate? Yes  Skin:  Reviewed, no issues  Last BM:  04/19/16  Height:   Ht Readings from Last 1 Encounters:  04/20/16 5' 4"  (1.626 m)    Weight:   Wt Readings from Last 1 Encounters:  04/20/16 123 lb 14.4 oz (56.2 kg)    Ideal Body Weight:     BMI:  Body mass index is 21.27 kg/m.  Estimated Nutritional Needs:   Kcal:  1800-2000 kcals  Protein:  90-100 g  Fluid:  >/= 1.8 L  EDUCATION NEEDS:   Education needs addressed  Kerman Passey MS, [place], LDN [phone] Pager  [phone] Weekend/On-Call  Pager

## 2016-04-21 NOTE — Consult Note (Signed)
MEDICATION RELATED CONSULT NOTE - INITIAL   Pharmacy Consult for electrolytes Indication: hypokalemia, hypomagnesium  Allergies  Allergen Reactions  . Demerol [Meperidine] Other (See Comments)    Pt states that this medication causes cardiac arrest.    . Dilaudid [Hydromorphone Hcl] Other (See Comments)    Pt states that this medication causes cardiac arrest.    . Morphine And Related Other (See Comments)    Pt states that this medication causes cardiac arrest.    . Darvon [Propoxyphene] Nausea And Vomiting and Rash  . Flexeril [Cyclobenzaprine] Nausea And Vomiting and Rash  . Levaquin [Levofloxacin In D5w] Nausea And Vomiting and Rash  . Soma [Carisoprodol] Nausea And Vomiting and Rash  . Zofran [Ondansetron Hcl] Nausea And Vomiting and Rash    Patient Measurements: Height: 5' 4"  (162.6 cm) Weight: 123 lb 14.4 oz (56.2 kg) IBW/kg (Calculated) : 54.7 Adjusted Body Weight:   Vital Signs: Temp: 98.6 F (37 C) (03/08 1351) Temp Source: Oral (03/08 1351) BP: 163/86 (03/08 1351) Pulse Rate: 96 (03/08 1351) Intake/Output from previous day: No intake/output data recorded. Intake/Output from this shift: No intake/output data recorded.  Labs:  Recent Labs  04/19/16 1905 04/21/16 1130  WBC 9.3  --   HGB 12.1  --   HCT 34.7*  --   PLT 176  --   APTT 29  --   CREATININE 0.68  --   MG  --  1.4*  ALBUMIN 4.3  --   PROT 6.9  --   AST 39  --   ALT 40  --   ALKPHOS 124  --   BILITOT 3.9*  --    Estimated Creatinine Clearance: 72.6 mL/min (by C-G formula based on SCr of 0.68 mg/dL).   Microbiology: Recent Results (from the past 720 hour(s))  Blood culture (routine x 2)     Status: None (Preliminary result)   Collection Time: 04/19/16  7:06 PM  Result Value Ref Range Status   Specimen Description BLOOD L AC  Final   Special Requests BOTTLES DRAWN AEROBIC AND ANAEROBIC BCAV  Final   Culture NO GROWTH 2 DAYS  Final   Report Status PENDING  Incomplete  Blood culture  (routine x 2)     Status: None (Preliminary result)   Collection Time: 04/19/16  7:06 PM  Result Value Ref Range Status   Specimen Description BLOOD L FOREARM  Final   Special Requests BOTTLES DRAWN AEROBIC AND ANAEROBIC  BCAV  Final   Culture NO GROWTH 2 DAYS  Final   Report Status PENDING  Incomplete    Medical History: Past Medical History:  Diagnosis Date  . Diabetes mellitus without complication ([place])   . GERD (gastroesophageal reflux disease)   . Hypertension   . Liver disease   . Pancreatitis     Medications:  Scheduled:  . amLODipine  5 mg Oral Daily  . aspirin EC  81 mg Oral Daily  . doxycycline  100 mg Oral Q12H  . enoxaparin (LOVENOX) injection  40 mg Subcutaneous Q24H  . insulin aspart  0-9 Units Subcutaneous TID WC  . insulin aspart  3 Units Subcutaneous TID WC  . insulin glargine  12 Units Subcutaneous Daily  . lisinopril  40 mg Oral Daily  . magnesium oxide  400 mg Oral TID  . magnesium sulfate 1 - 4 g bolus IVPB  4 g Intravenous Once  . pantoprazole  40 mg Oral BID  . potassium chloride (KCL MULTIRUN) 30 mEq in 265  mL IVPB  30 mEq Intravenous Once  . potassium chloride SA  20 mEq Oral BID  . predniSONE  50 mg Oral Q breakfast  . vitamin E  400 Units Oral Daily    Assessment: Pt is a 51 year old female who presents with pain, fatigue and rash. Found to have K of 2.6 (3/6) and Mg 1.4 (3/8)  Goal of Therapy:  Normalization of electrolytes  Plan:  K lab resulted the evening of 3/6, pt has received a total of 40 MEQ po since then. Add on Mg this AM shows a Mg level of 1.4. Pt home dose of K is 20 MEQ BID. Will give Mg IV 4g once and KCL 30 MEQ IV once.  Pt will most likely require more K than this but since last lab was 2 days ago, will recheck 1 hr after 30 MEQ is finished infusing. Replace as needed. Check phos level in the AM.  Tarahji Ramthun D Claribel Sachs, Pharm.D, BCPS Clinical Pharmacist  04/21/2016,2:17 PM

## 2016-04-21 NOTE — Progress Notes (Signed)
MEDICATION RELATED CONSULT NOTE - INITIAL   Pharmacy Consult for Electrolytes  Indication: K and Mag   Allergies  Allergen Reactions  . Demerol [Meperidine] Other (See Comments)    Pt states that this medication causes cardiac arrest.    . Dilaudid [Hydromorphone Hcl] Other (See Comments)    Pt states that this medication causes cardiac arrest.    . Morphine And Related Other (See Comments)    Pt states that this medication causes cardiac arrest.    . Darvon [Propoxyphene] Nausea And Vomiting and Rash  . Flexeril [Cyclobenzaprine] Nausea And Vomiting and Rash  . Levaquin [Levofloxacin In D5w] Nausea And Vomiting and Rash  . Soma [Carisoprodol] Nausea And Vomiting and Rash  . Zofran [Ondansetron Hcl] Nausea And Vomiting and Rash    Patient Measurements: Height: 5' 4"  (162.6 cm) Weight: 123 lb 14.4 oz (56.2 kg) IBW/kg (Calculated) : 54.7 Adjusted Body Weight:   Vital Signs: Temp: 97.9 F (36.6 C) (03/08 1911) Temp Source: Oral (03/08 1911) BP: 151/87 (03/08 1911) Pulse Rate: 90 (03/08 1911) Intake/Output from previous day: No intake/output data recorded. Intake/Output from this shift: No intake/output data recorded.  Labs:  Recent Labs  04/19/16 1905 04/21/16 1130 04/21/16 2052  WBC 9.3  --   --   HGB 12.1  --   --   HCT 34.7*  --   --   PLT 176  --   --   APTT 29  --   --   CREATININE 0.68  --   --   MG  --  1.4* 2.5*  ALBUMIN 4.3  --   --   PROT 6.9  --   --   AST 39  --   --   ALT 40  --   --   ALKPHOS 124  --   --   BILITOT 3.9*  --   --    Estimated Creatinine Clearance: 72.6 mL/min (by C-G formula based on SCr of 0.68 mg/dL).   Microbiology: Recent Results (from the past 720 hour(s))  Blood culture (routine x 2)     Status: None (Preliminary result)   Collection Time: 04/19/16  7:06 PM  Result Value Ref Range Status   Specimen Description BLOOD L AC  Final   Special Requests BOTTLES DRAWN AEROBIC AND ANAEROBIC BCAV  Final   Culture NO GROWTH 2  DAYS  Final   Report Status PENDING  Incomplete  Blood culture (routine x 2)     Status: None (Preliminary result)   Collection Time: 04/19/16  7:06 PM  Result Value Ref Range Status   Specimen Description BLOOD L FOREARM  Final   Special Requests BOTTLES DRAWN AEROBIC AND ANAEROBIC  BCAV  Final   Culture NO GROWTH 2 DAYS  Final   Report Status PENDING  Incomplete    Medical History: Past Medical History:  Diagnosis Date  . Diabetes mellitus without complication ([place])   . GERD (gastroesophageal reflux disease)   . Hypertension   . Liver disease   . Pancreatitis     Medications:  Prescriptions Prior to Admission  Medication Sig Dispense Refill Last Dose  . amLODipine (NORVASC) 5 MG tablet Take 1 tablet (5 mg total) by mouth daily. 60 tablet 1 04/19/2016 at Unknown time  . aspirin EC 81 MG tablet Take 81 mg by mouth daily.   04/19/2016 at Unknown time  . lisinopril (PRINIVIL,ZESTRIL) 20 MG tablet Take 1 tablet (20 mg total) by mouth daily. (Patient taking differently: Take  40 mg by mouth daily. ) 60 tablet 1 04/19/2016 at Unknown time  . Magnesium Oxide 500 MG TABS Take 250 mg by mouth 3 (three) times daily.    04/19/2016 at Unknown time  . pantoprazole (PROTONIX) 40 MG tablet Take 1 tablet (40 mg total) by mouth 2 (two) times daily. 60 tablet 1 04/19/2016 at Unknown time  . potassium gluconate 595 (99 K) MG TABS tablet Take 595 mg by mouth daily.   04/19/2016 at Unknown time  . vitamin E 400 UNIT capsule Take 400 Units by mouth daily.   04/19/2016 at Unknown time  . insulin lispro (HUMALOG) 100 UNIT/ML injection Inject 0.04 mLs (4 Units total) into the skin 3 (three) times daily with meals. Pt uses based off the amount of carbs she eats at each meal. (Patient taking differently: Inject 2-9 Units into the skin 3 (three) times daily with meals. Pt uses based off the amount of carbs she eats at each meal.) 10 mL 11 04/19/2016 at lunch  . oxyCODONE (ROXICODONE) 5 MG immediate release tablet Take 1 tablet  (5 mg total) by mouth every 8 (eight) hours as needed. 20 tablet 0 prn at prn  . oxyCODONE (ROXICODONE) 5 MG immediate release tablet Take 1 tablet (5 mg total) by mouth every 8 (eight) hours as needed. 20 tablet 0   . potassium chloride SA (K-DUR,KLOR-CON) 20 MEQ tablet Take 1 tablet (20 mEq total) by mouth 2 (two) times daily. 14 tablet 0   . promethazine (PHENERGAN) 12.5 MG tablet Take 1 tablet (12.5 mg total) by mouth every 6 (six) hours as needed for nausea or vomiting. 12 tablet 0 prn at prn  . [DISCONTINUED] amoxicillin (AMOXIL) 500 MG tablet Take 1 tablet (500 mg total) by mouth 3 (three) times daily. (Patient not taking: Reported on 04/19/2016) 30 tablet 0 Not Taking at Unknown time  . [DISCONTINUED] cloNIDine (CATAPRES) 0.1 MG tablet Take 1 tablet (0.1 mg total) by mouth 2 (two) times daily. (Patient not taking: Reported on 02/24/2016) 60 tablet 11 Not Taking at Unknown time  . [DISCONTINUED] diazepam (VALIUM) 5 MG tablet Take 1 tablet (5 mg total) by mouth every 8 (eight) hours as needed for muscle spasms. (Patient not taking: Reported on 04/20/2016) 20 tablet 0 Not Taking  . [DISCONTINUED] gabapentin (NEURONTIN) 300 MG capsule Take 1 capsule (300 mg total) by mouth 3 (three) times daily. (Patient not taking: Reported on 04/19/2016) 90 capsule 0 Not Taking at Unknown time  . [DISCONTINUED] insulin glargine (LANTUS) 100 UNIT/ML injection Inject 0.12 mLs (12 Units total) into the skin at bedtime. (Patient not taking: Reported on 04/19/2016) 10 mL 11 Not Taking at Unknown time  . [DISCONTINUED] lipase/protease/amylase (CREON) 12000 units CPEP capsule Take 1 capsule (12,000 Units total) by mouth 4 (four) times daily -  with meals and at bedtime. (Patient not taking: Reported on 02/24/2016) 270 capsule 0 Not Taking at Unknown time  . [DISCONTINUED] metoprolol tartrate (LOPRESSOR) 25 MG tablet Take 1 tablet (25 mg total) by mouth 2 (two) times daily. (Patient not taking: Reported on 04/19/2016) 30 tablet 0 Not  Taking at Unknown time    Assessment: 3/08 @ 21:00 :  K = 3.9, Mag = 2.5  Goal of Therapy:  Normalization of electrolytes   Plan:  No electrolyte supplementation needed at this time.  Will recheck electrolytes on 3/9 with AM labs.   Woodard Perrell D 04/21/2016,9:32 PM

## 2016-04-21 NOTE — Progress Notes (Signed)
Inpatient Diabetes Program Recommendations  AACE/ADA: New Consensus Statement on Inpatient Glycemic Control (2015)  Target Ranges:  Prepandial:   less than 140 mg/dL      Peak postprandial:   less than 180 mg/dL (1-2 hours)      Critically ill patients:  140 - 180 mg/dL   Results for Amber Christensen, Amber Christensen (MRN [id number]) as of 04/21/2016 09:23  Ref. Range 04/20/2016 16:12 04/20/2016 21:10 04/21/2016 07:25  Glucose-Capillary Latest Ref Range: 65 - 99 mg/dL 348 (H) 355 (H) 345 (H)    Admit with: Painful Rash/ LE Edema  History: DM secondary to Partial Pancreatectomy due to Necrotizing Pancreatitis  Home DM Meds: Lantus 12 units QHS       Humalog 1 unit for every 15 grams of Carbohydrates + 1 extra unit at supper       Humalog 1 unit for every 50 mg/dl above target CBG of 130 mg/dl  Current Insulin Orders: Novolog Sensitive Correction Scale/ SSI (0-9 units) TID AC      -Per review of Care Everywhere records, patient met with her Endocrinologist (Dr. Lucilla Lame) on 04/11/16.  At that visit, patient was instructed to take the following insulin regimen: Lantus 12 units QHS Humalog 1 unit for every 15 grams of Carbohydrates + 1 extra unit at supper Humalog 1 unit for every 50 mg/dl above target CBG of 130 mg/dl  -Patient currently only receiving Novolog Sensitive SSI at present.  CBG this AM: 345 mg/dl.  -Also note patient currently getting Prednisone 50 mg daily.    MD- Please consider the following in-hospital insulin adjustments:  1. Start Lantus 12 units daily (give 1st dose this AM)  2. Start Novolog Meal Coverage: Novolog 3 units TID with meals to start (hold if pt eats <50% of meal)    --Will follow patient during hospitalization--  Wyn Quaker RN, MSN, CDE Diabetes Coordinator Inpatient Glycemic Control Team Team Pager: [phone] (8a-5p)

## 2016-04-21 NOTE — Progress Notes (Signed)
Patient is c/o  Mild chest discomfort and heaviness that is radiating down the left arm. Heart sounds normal and all VS stable. MD notified.

## 2016-04-21 NOTE — Progress Notes (Addendum)
[facility] [place] NAME: Amber Christensen    MR#:  [id number]  DATE OF BIRTH:  1965/09/24  SUBJECTIVE:  CHIEF COMPLAINT:  No chief complaint on file. Seems anxious, wanting real diagnosis for her skin disease.  Feels she may have lupus, requesting to see if she will get her stress test done while here in the hospital also REVIEW OF SYSTEMS:  Review of Systems  Constitutional: Negative for chills, fever and weight loss.  HENT: Negative for nosebleeds and sore throat.   Eyes: Negative for blurred vision.  Respiratory: Negative for cough, shortness of breath and wheezing.   Cardiovascular: Positive for leg swelling. Negative for chest pain, orthopnea and PND.  Gastrointestinal: Negative for abdominal pain, constipation, diarrhea, heartburn, nausea and vomiting.  Genitourinary: Negative for dysuria and urgency.  Musculoskeletal: Negative for back pain.  Skin: Positive for rash.  Neurological: Negative for dizziness, speech change, focal weakness and headaches.  Endo/Heme/Allergies: Does not bruise/bleed easily.  Psychiatric/Behavioral: Negative for depression.   DRUG ALLERGIES:   Allergies  Allergen Reactions  . Demerol [Meperidine] Other (See Comments)    Pt states that this medication causes cardiac arrest.    . Dilaudid [Hydromorphone Hcl] Other (See Comments)    Pt states that this medication causes cardiac arrest.    . Morphine And Related Other (See Comments)    Pt states that this medication causes cardiac arrest.    . Darvon [Propoxyphene] Nausea And Vomiting and Rash  . Flexeril [Cyclobenzaprine] Nausea And Vomiting and Rash  . Levaquin [Levofloxacin In D5w] Nausea And Vomiting and Rash  . Soma [Carisoprodol] Nausea And Vomiting and Rash  . Zofran [Ondansetron Hcl] Nausea And Vomiting and Rash   VITALS:  Blood pressure (!) 154/72, pulse 81, temperature 97.7 F (36.5 C), temperature source Oral, resp. rate 18, height _0   (1.626 m), weight 56.2 kg (123 lb 14.4 oz), SpO2 97 %. PHYSICAL EXAMINATION:  Physical Exam  Constitutional: She is oriented to person, place, and time and well-developed, well-nourished, and in no distress.  HENT:  Head: Normocephalic and atraumatic.  Eyes: Conjunctivae and EOM are normal. Pupils are equal, round, and reactive to light.  Neck: Normal range of motion. Neck supple. No tracheal deviation present. No thyromegaly present.  Cardiovascular: Normal rate, regular rhythm and normal heart sounds.   Pulmonary/Chest: Effort normal and breath sounds normal. No respiratory distress. She has no wheezes. She exhibits no tenderness.  Abdominal: Soft. Bowel sounds are normal. She exhibits no distension. There is no tenderness.  Musculoskeletal: Normal range of motion.  Neurological: She is alert and oriented to person, place, and time. No cranial nerve deficit.  Skin: Skin is warm and dry. Rash noted. Rash is maculopapular.     Maculopapular skin rash.  She has a bandage on her right skin rash status post biopsy site  Psychiatric: Mood and affect normal.   LABORATORY PANEL:  Female CBC  Recent Labs Lab 04/19/16 1905  WBC 9.3  HGB 12.1  HCT 34.7*  PLT 176   ------------------------------------------------------------------------------------------------------------------ Chemistries   Recent Labs Lab 04/19/16 1905  NA 140  K 2.6*  CL 105  CO2 26  GLUCOSE 153*  BUN 7  CREATININE 0.68  CALCIUM 7.6*  AST 39  ALT 40  ALKPHOS 124  BILITOT 3.9*   RADIOLOGY:  US Venous Img Lower Unilateral Right  Result Date: 04/20/2016 CLINICAL DATA:  Right leg swelling for a day. EXAM: Right LOWER EXTREMITY VENOUS DOPPLER  ULTRASOUND TECHNIQUE: Gray-scale sonography with graded compression, as well as color Doppler and duplex ultrasound were performed to evaluate the lower extremity deep venous systems from the level of the common femoral vein and including the common femoral, femoral,  profunda femoral, popliteal and calf veins including the posterior tibial, peroneal and gastrocnemius veins when visible. The superficial great saphenous vein was also interrogated. Spectral Doppler was utilized to evaluate flow at rest and with distal augmentation maneuvers in the common femoral, femoral and popliteal veins. COMPARISON:  None. FINDINGS: Contralateral Common Femoral Vein: Respiratory phasicity is normal and symmetric with the symptomatic side. No evidence of thrombus. Normal compressibility. Common Femoral Vein: No evidence of thrombus. Normal compressibility, respiratory phasicity and response to augmentation. Saphenofemoral Junction: No evidence of thrombus. Normal compressibility and flow on color Doppler imaging. Profunda Femoral Vein: No evidence of thrombus. Normal compressibility and flow on color Doppler imaging. Femoral Vein: No evidence of thrombus. Normal compressibility, respiratory phasicity and response to augmentation. Popliteal Vein: No evidence of thrombus. Normal compressibility, respiratory phasicity and response to augmentation. Calf Veins: No evidence of thrombus. Normal compressibility and flow on color Doppler imaging. Superficial Great Saphenous Vein: No evidence of thrombus. Normal compressibility and flow on color Doppler imaging. Venous Reflux:  None. Other Findings:  None. IMPRESSION: No evidence of deep venous thrombosis in the right lower extremity. Electronically Signed   By: Lorin Picket M.D.   On: 04/20/2016 12:32   ASSESSMENT AND PLAN:  Amber Christensen  is a 51 y.o. 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 Patient's workup was done in the emergency room the first time sent home  came back again with rash both upper and lower extremity around the ankle. Patient denies any tick bite, travel, trying a new food laundry detergent or any fragrance. Denies any such kind rash in the past.  1. Acute maculopapular rash over the chest back both upper and lower extremity associated with lower extremity edema and painful bilateral lower extremity  -r/o vasculitis -status post skin biopsy yesterday by dermatology, viral exanthem, ?autoimmune disorder -Dermatology following -Rheumatology consult with Dr Jefm Bryant, ID c/s with Dr. Ola Spurr -Oral prednisone 50 mg daily, also on doxycycline empirically -When necessary Benadryl and ranitidine  -Follow-up ESR, ANA, CRP, parvovirus B 19 and [place] spotted fever test  - will order lupus panel  2.  Uncontrolled Diabetes type 2  -Continue Lantus 12 units and Humalog sliding scale  -Diabetic nurse consult   3. Hypertension -Continue amlodipine and lisinopril  4.  Hypokalemia -Replete and recheck  DVT prophylaxis subcutaneous Lovenox     All the records are reviewed and case discussed with Care Management/Social Worker. Management plans discussed with the patient, family and they are in agreement.  CODE STATUS: Full Code  TOTAL TIME TAKING CARE OF THIS PATIENT: 25 minutes.   More than 50% of the time was spent in counseling/coordination of care: YES  POSSIBLE D/C IN 2-3 DAYS, DEPENDING ON CLINICAL CONDITION.  And ID, rheumatology and dermatology evaluation   Max Sane M.D on 04/21/2016 at 8:54 AM  Between 7am to 6pm - Pager - [phone]  After 6pm go to [email or web address] - Technical brewer [facility]  Office  [phone]  CC: Primary care physician;  Singh,Jasmine, MD  Note: This dictation was prepared with Dragon dictation along with smaller phrase technology. Any transcriptional errors that result from this process are unintentional.

## 2016-04-22 LAB — CBC
HCT: 30.8 % — ABNORMAL LOW (ref 35.0–47.0)
Hemoglobin: 10.5 g/dL — ABNORMAL LOW (ref 12.0–16.0)
MCH: 31 pg (ref 26.0–34.0)
MCHC: 34.1 g/dL (ref 32.0–36.0)
MCV: 91.1 fL (ref 80.0–100.0)
Platelets: 137 10*3/uL — ABNORMAL LOW (ref 150–440)
RBC: 3.38 MIL/uL — ABNORMAL LOW (ref 3.80–5.20)
RDW: 14.7 % — ABNORMAL HIGH (ref 11.5–14.5)
WBC: 6.6 10*3/uL (ref 3.6–11.0)

## 2016-04-22 LAB — BASIC METABOLIC PANEL
Anion gap: 4 — ABNORMAL LOW (ref 5–15)
BUN: 15 mg/dL (ref 6–20)
CO2: 27 mmol/L (ref 22–32)
Calcium: 7.1 mg/dL — ABNORMAL LOW (ref 8.9–10.3)
Chloride: 104 mmol/L (ref 101–111)
Creatinine, Ser: 0.78 mg/dL (ref 0.44–1.00)
GFR calc Af Amer: >60 mL/min (ref >60)
GFR calc non Af Amer: >60 mL/min (ref >60)
Glucose, Bld: 344 mg/dL — ABNORMAL HIGH (ref 65–99)
Potassium: 4.1 mmol/L (ref 3.5–5.1)
Sodium: 135 mmol/L (ref 135–145)

## 2016-04-22 LAB — ANA COMPREHENSIVE PANEL

## 2016-04-22 LAB — MAGNESIUM: Magnesium: 2.1 mg/dL (ref 1.7–2.4)

## 2016-04-22 LAB — LUPUS ANTICOAGULANT PANEL
DRVVT: 25.5 s (ref 0.0–47.0)
PTT Lupus Anticoagulant: 30 s (ref 0.0–51.9)

## 2016-04-22 LAB — PHOSPHORUS: Phosphorus: 2.7 mg/dL (ref 2.5–4.6)

## 2016-04-22 LAB — GLUCOSE, CAPILLARY
Glucose-Capillary: 216 mg/dL — ABNORMAL HIGH (ref 65–99)
Glucose-Capillary: 200 mg/dL — ABNORMAL HIGH (ref 65–99)
Glucose-Capillary: 187 mg/dL — ABNORMAL HIGH (ref 65–99)

## 2016-04-22 MED ORDER — PREDNISONE 10 MG PO TABS: ORAL_TABLET | ORAL | 0 refills | Status: DC

## 2016-04-22 NOTE — Care Management Important Message (Signed)
Important Message  Patient Details  Name: Amber Christensen MRN: [id number] Date of Birth: Aug 12, 1965   Medicare Important Message Given:  Yes    Jolly Mango, RN 04/22/2016, 9:11 AM

## 2016-04-22 NOTE — Progress Notes (Signed)
MEDICATION RELATED CONSULT NOTE - INITIAL   Pharmacy Consult for Electrolytes  Indication: K and Mag   Allergies  Allergen Reactions  . Demerol [Meperidine] Other (See Comments)    Pt states that this medication causes cardiac arrest.    . Dilaudid [Hydromorphone Hcl] Other (See Comments)    Pt states that this medication causes cardiac arrest.    . Morphine And Related Other (See Comments)    Pt states that this medication causes cardiac arrest.    . Darvon [Propoxyphene] Nausea And Vomiting and Rash  . Flexeril [Cyclobenzaprine] Nausea And Vomiting and Rash  . Levaquin [Levofloxacin In D5w] Nausea And Vomiting and Rash  . Soma [Carisoprodol] Nausea And Vomiting and Rash  . Zofran [Ondansetron Hcl] Nausea And Vomiting and Rash    Patient Measurements: Height: 5' 4"  (162.6 cm) Weight: 123 lb 14.4 oz (56.2 kg) IBW/kg (Calculated) : 54.7 Adjusted Body Weight:   Vital Signs: Temp: 98.4 F (36.9 C) (03/09 0456) Temp Source: Oral (03/09 0456) BP: 140/73 (03/09 0456) Pulse Rate: 69 (03/09 0456) Intake/Output from previous day: 03/08 0701 - 03/09 0700 In: 365 [IV Piggyback:365] Out: -  Intake/Output from this shift: No intake/output data recorded.  Labs:  Recent Labs  04/19/16 1905 04/21/16 1130 04/21/16 2052 04/22/16 0430  WBC 9.3  --   --  6.6  HGB 12.1  --   --  10.5*  HCT 34.7*  --   --  30.8*  PLT 176  --   --  137*  APTT 29  --   --   --   CREATININE 0.68  --   --  0.78  MG  --  1.4* 2.5* 2.1  PHOS  --   --   --  2.7  ALBUMIN 4.3  --   --   --   PROT 6.9  --   --   --   AST 39  --   --   --   ALT 40  --   --   --   ALKPHOS 124  --   --   --   BILITOT 3.9*  --   --   --    Estimated Creatinine Clearance: 72.6 mL/min (by C-G formula based on SCr of 0.78 mg/dL).   Microbiology: Recent Results (from the past 720 hour(s))  Blood culture (routine x 2)     Status: None (Preliminary result)   Collection Time: 04/19/16  7:06 PM  Result Value Ref Range Status    Specimen Description BLOOD L AC  Final   Special Requests BOTTLES DRAWN AEROBIC AND ANAEROBIC BCAV  Final   Culture NO GROWTH 2 DAYS  Final   Report Status PENDING  Incomplete  Blood culture (routine x 2)     Status: None (Preliminary result)   Collection Time: 04/19/16  7:06 PM  Result Value Ref Range Status   Specimen Description BLOOD L FOREARM  Final   Special Requests BOTTLES DRAWN AEROBIC AND ANAEROBIC  BCAV  Final   Culture NO GROWTH 2 DAYS  Final   Report Status PENDING  Incomplete    Medical History: Past Medical History:  Diagnosis Date  . Diabetes mellitus without complication ([place])   . GERD (gastroesophageal reflux disease)   . Hypertension   . Liver disease   . Pancreatitis     Medications:  Prescriptions Prior to Admission  Medication Sig Dispense Refill Last Dose  . amLODipine (NORVASC) 5 MG tablet Take 1 tablet (5 mg total)  by mouth daily. 60 tablet 1 04/19/2016 at Unknown time  . aspirin EC 81 MG tablet Take 81 mg by mouth daily.   04/19/2016 at Unknown time  . lisinopril (PRINIVIL,ZESTRIL) 20 MG tablet Take 1 tablet (20 mg total) by mouth daily. (Patient taking differently: Take 40 mg by mouth daily. ) 60 tablet 1 04/19/2016 at Unknown time  . Magnesium Oxide 500 MG TABS Take 250 mg by mouth 3 (three) times daily.    04/19/2016 at Unknown time  . pantoprazole (PROTONIX) 40 MG tablet Take 1 tablet (40 mg total) by mouth 2 (two) times daily. 60 tablet 1 04/19/2016 at Unknown time  . potassium gluconate 595 (99 K) MG TABS tablet Take 595 mg by mouth daily.   04/19/2016 at Unknown time  . vitamin E 400 UNIT capsule Take 400 Units by mouth daily.   04/19/2016 at Unknown time  . insulin lispro (HUMALOG) 100 UNIT/ML injection Inject 0.04 mLs (4 Units total) into the skin 3 (three) times daily with meals. Pt uses based off the amount of carbs she eats at each meal. (Patient taking differently: Inject 2-9 Units into the skin 3 (three) times daily with meals. Pt uses based off the  amount of carbs she eats at each meal.) 10 mL 11 04/19/2016 at lunch  . oxyCODONE (ROXICODONE) 5 MG immediate release tablet Take 1 tablet (5 mg total) by mouth every 8 (eight) hours as needed. 20 tablet 0 prn at prn  . oxyCODONE (ROXICODONE) 5 MG immediate release tablet Take 1 tablet (5 mg total) by mouth every 8 (eight) hours as needed. 20 tablet 0   . potassium chloride SA (K-DUR,KLOR-CON) 20 MEQ tablet Take 1 tablet (20 mEq total) by mouth 2 (two) times daily. 14 tablet 0   . promethazine (PHENERGAN) 12.5 MG tablet Take 1 tablet (12.5 mg total) by mouth every 6 (six) hours as needed for nausea or vomiting. 12 tablet 0 prn at prn  . [DISCONTINUED] amoxicillin (AMOXIL) 500 MG tablet Take 1 tablet (500 mg total) by mouth 3 (three) times daily. (Patient not taking: Reported on 04/19/2016) 30 tablet 0 Not Taking at Unknown time  . [DISCONTINUED] cloNIDine (CATAPRES) 0.1 MG tablet Take 1 tablet (0.1 mg total) by mouth 2 (two) times daily. (Patient not taking: Reported on 02/24/2016) 60 tablet 11 Not Taking at Unknown time  . [DISCONTINUED] diazepam (VALIUM) 5 MG tablet Take 1 tablet (5 mg total) by mouth every 8 (eight) hours as needed for muscle spasms. (Patient not taking: Reported on 04/20/2016) 20 tablet 0 Not Taking  . [DISCONTINUED] gabapentin (NEURONTIN) 300 MG capsule Take 1 capsule (300 mg total) by mouth 3 (three) times daily. (Patient not taking: Reported on 04/19/2016) 90 capsule 0 Not Taking at Unknown time  . [DISCONTINUED] insulin glargine (LANTUS) 100 UNIT/ML injection Inject 0.12 mLs (12 Units total) into the skin at bedtime. (Patient not taking: Reported on 04/19/2016) 10 mL 11 Not Taking at Unknown time  . [DISCONTINUED] lipase/protease/amylase (CREON) 12000 units CPEP capsule Take 1 capsule (12,000 Units total) by mouth 4 (four) times daily -  with meals and at bedtime. (Patient not taking: Reported on 02/24/2016) 270 capsule 0 Not Taking at Unknown time  . [DISCONTINUED] metoprolol tartrate  (LOPRESSOR) 25 MG tablet Take 1 tablet (25 mg total) by mouth 2 (two) times daily. (Patient not taking: Reported on 04/19/2016) 30 tablet 0 Not Taking at Unknown time    Assessment: 3/08 @ 21:00 :  K = 3.9, Mag = 2.5  Goal of Therapy:  Normalization of electrolytes   Plan:  No electrolyte supplementation needed at this time.  Will recheck electrolytes on 3/9 with AM labs.   3/9 AM K+ and Mg WNL. Recheck BMP with tomorrow AM labs.  Henryetta Corriveau S 04/22/2016,6:17 AM

## 2016-04-22 NOTE — Discharge Instructions (Signed)
Vasculitis Vasculitis is swelling (inflammation) of the blood vessels. With vasculitis, the blood vessels can become thick, narrow, scarred, or weak, and enough blood may not be able to flow through them. This can cause damage to the muscles, kidneys, lungs, brain, and other parts of the body. There are many types of vasculitis. Some last only a short time while others last a long time. What are the causes? The exact cause is unknown, but vasculitis can develop when the body's immune system attacks its own blood vessels. This attack can be caused by:  An infection.  An immune system disease, such as lupus, rheumatoid arthritis, or scleroderma.  An allergic reaction to a medicine.  Cancer that affects blood cells, such as leukemia and lymphoma. What increases the risk?  Being a smoker.  Being under stress.  Having a physical injury. What are the signs or symptoms? Symptoms vary depending on the type of vasculitis you have. Symptoms that are common to all types of vasculitis include:  Fever.  Poor appetite.  Weight loss.  Feeling very tired.  Having aches and pains.  Weakness.  Numbness in an area of your body. Symptoms for specific types of vasculitis include:  Skin problems, such as sores, spots, or rashes.  Trouble seeing.  Trouble breathing.  Blood in your urine.  Headaches.  Stomach pain.  Stuffy or bloody nose. How is this diagnosed? Your health care provider will ask about your symptoms and do a physical exam. You may have tests done, such as:  A complete blood count (CBC).  Erythrocyte sedimentation, also called sed rate test.  C-reactive protein (CRP).  Antineutrophil cytoplasmic antibodies (ANCA).  A urine test.  A biopsy of a blood vessel.  A nerve conduction study.  Imaging tests, such as:  X-rays.  A CT scan.  An ultrasound.  An MRI.  Angiography. How is this treated? Treatment will depend on the type of vasculitis you have  and how severe it is. Sometimes treatment is not needed. Treatment often includes:  Medicines.  Physical therapy or occupational therapy. This helps strengthen muscles that were weakened by the disease. You will need to see your health care provider while you are being treated. During follow-up visits, your health care provider will:  Perform blood tests and bone density tests.  Check your blood pressure and blood sugar.  Check for side effects of any medicines you are taking. Vasculitis cannot always be cured. Sometimes symptoms go away but the disease does not (the disease goes in remission). If symptoms return, increased treatment may be needed. Follow these instructions at home:  Take medicines only as directed by your health care provider.  Keep all follow-up visits as directed by your health care provider. This is important.  Exercise. Talk with your health care provider about what exercises are okay for you to do. Usually exercises that increase your heart rate (aerobic exercise), such as walking, are recommended. Aerobic exercise helps control your blood pressure and prevent bone loss.  Follow a healthy diet. Include healthy sources of protein, fruits, vegetables, and whole grains in your diet.  Learn as much as you can about vasculitis, and consider joining a support group. Understanding your condition and talking with others who have it may help you cope. Talk with your health care provider if you feel stressed, anxious, or depressed. Contact a health care provider if:  Your symptoms return, or you have new symptoms.  Your fever, fatigue, headache, or weight loss gets worse.  You  have signs of infection, such as fever, warmth, tenderness, redness, or swelling. Get help right away if:  Your vision gets worse.  Your pain does not go away, even after you take pain medicine.  You have chest or stomach pain.  You have trouble breathing.  One side of your face or body  suddenly becomes weak or numb.  Your nose bleeds.  There is blood in your urine. This information is not intended to replace advice given to you by your health care provider. Make sure you discuss any questions you have with your health care provider. Document Released: 11/27/2008 Document Revised: 07/09/2015 Document Reviewed: 03/27/2013 Elsevier Interactive Patient Education  2017 [employer].

## 2016-04-22 NOTE — Consult Note (Signed)
Reason for Consult: Rule out lupus  Referring Physician: Dr. Bonnita Nasuti   HPI: 51 year old Caucasian female. History of liver disease thought to be cirrhosis. Prior ERCP. Developed significant pancreatitis resulting in multiple bouts of pancreatitis. Had to have partial pancreatectomy. Now diabetic on insulin Darted recent job at [employer]. Had sudden onset of low-grade fever and pain in neck and shoulders. Seen emergency room. Labs unremarkable. Chest CT unremarkable Aggress to have rash in her forearm. It. Then developed swelling and rash in her legs. There was swelling. Itching. She had biopsy showing leukocytoclastic vasculitis. She's received IV steroids with remarkable improvement. Did have nausea today but has not had any diarrhea or abdominal pain. She has negative ANA. Sedimentation rate normal periods of no cytopenia. No thrombocytopenia. Liver functions and creatinine are normal. Normal urine . no history of sinus disease. No history of hepatitis B or C  PMH: Diabetes. Liver disease  SURGICAL HISTORY: Pancreatectomy  Family History: Negative for connective tissue diseases  Social History: No significant cigarettes or alcohol  Allergies:  Allergies  Allergen Reactions  . Demerol [Meperidine] Other (See Comments)    Pt states that this medication causes cardiac arrest.    . Dilaudid [Hydromorphone Hcl] Other (See Comments)    Pt states that this medication causes cardiac arrest.    . Morphine And Related Other (See Comments)    Pt states that this medication causes cardiac arrest.    . Darvon [Propoxyphene] Nausea And Vomiting and Rash  . Flexeril [Cyclobenzaprine] Nausea And Vomiting and Rash  . Levaquin [Levofloxacin In D5w] Nausea And Vomiting and Rash  . Soma [Carisoprodol] Nausea And Vomiting and Rash  . Zofran [Ondansetron Hcl] Nausea And Vomiting and Rash    Medications:  Scheduled: . amLODipine  5 mg Oral Daily  . aspirin EC  81 mg Oral Daily  .  enoxaparin (LOVENOX) injection  40 mg Subcutaneous Q24H  . insulin aspart  0-9 Units Subcutaneous TID WC  . insulin aspart  3 Units Subcutaneous TID WC  . insulin glargine  12 Units Subcutaneous Daily  . lisinopril  40 mg Oral Daily  . magnesium oxide  400 mg Oral TID  . pantoprazole  40 mg Oral BID  . potassium chloride SA  20 mEq Oral BID  . predniSONE  50 mg Oral Q breakfast  . vitamin E  400 Units Oral Daily        ROS:Low-grade fever yesterday and 2 days ago. Weight is stable. No chest pain. No sinus disease. No Raynaud's. No photosensitive rash. No history of kidney disease. No proteinuria.   PHYSICAL EXAM: Blood pressure (!) 126/57, pulse 73, temperature 98.5 F (36.9 C), temperature source Oral, resp. rate 18, height 5' 4"  (1.626 m), weight 56.2 kg (123 lb 14.4 oz), SpO2 97 %. Pleasant female. No acute distress. Numerous ecchymoses on her she hands. Erythematous fading rash in both forearms. No nail fold telangiectasias. No hand telangiectasias. No sclerodactyly. There is circumferential erythema with no new petechial lesions in both upper ankles. The palms and soles have no rash. 5. Sclera clear. Oropharynx clear. No thyromegaly. Clear chest. No murmur. Nontender abdomen. No significant visceromegaly. No significant edema Muscular skeletal: Good range of motion neck and shoulders. Hands without synovitis. Hips move well. Knees ankles and toes without synovitis.  Assessment: Sudden onset of rash with leukocytoclastic vasculitis on biopsy. Itching. Suspect allergic Doubt systemic vasculitis given normal sedimentation rate and lack of other relating symptoms. Family negative. Chronic pancreatitis status  post pancreatectomy Chronic liver disease  Recommendations: She is so much better on steroids then would have no other recommendations regarding workup unless this is chronic and recurrent. Then we can consider other studies such as cryoglobulins and ANCA. No suggestion of  HSP  Emmaline Kluver 04/22/2016, 12:14 PM

## 2016-04-22 NOTE — Progress Notes (Signed)
Patient being discharged to home. DC and RX instructions given and patient verbalized understanding.  Picc line removed. Belongings packed.

## 2016-04-22 NOTE — Consult Note (Signed)
[facility]     Reason for Consult: Rasj   Referring Physician: Carlynn Christensen Date of Admission:  04/20/2016   Active Problems:   Maculopapular rash, generalized   Acute maculopapular rash   HPI: Amber Christensen is a 51 y.o. female whom I saw in hospital with progressive rash and low grade fevers. Please see my otpt note for more details  Since admission, no fevers, rash improving.  Past Medical History:  Diagnosis Date  . Diabetes mellitus without complication (Amber Christensen)   . GERD (gastroesophageal reflux disease)   . Hypertension   . Liver disease   . Pancreatitis    Past Surgical History:  Procedure Laterality Date  . ABDOMINAL HYSTERECTOMY    . APPENDECTOMY    . BLADDER SURGERY    . BOWEL RESECTION    . BREAST BIOPSY Left 1989  . CHOLECYSTECTOMY    . CYST REMOVAL HAND Left 1992   Ganglion cyst removed from Left Wrist  . DILATATION & CURETTAGE/HYSTEROSCOPY WITH MYOSURE    . KNEE ARTHROSCOPY Left 1992  . PANCREAS SURGERY    . REPAIR OF ESOPHAGUS  2012   3 clamps placed in esophagus   Social History  Substance Use Topics  . Smoking status: Former Smoker    Types: Cigarettes  . Smokeless tobacco: Former Systems developer  . Alcohol use No   History reviewed. No pertinent family history.  Allergies:  Allergies  Allergen Reactions  . Amber Christensen [Meperidine] Other (See Comments)    Pt states that this medication causes cardiac arrest.    . Amber Christensen [Hydromorphone Hcl] Other (See Comments)    Pt states that this medication causes cardiac arrest.    . Amber Christensen And Related Other (See Comments)    Pt states that this medication causes cardiac arrest.    . Amber Christensen [Propoxyphene] Nausea And Vomiting and Rash  . Amber Christensen [Cyclobenzaprine] Nausea And Vomiting and Rash  . Amber Christensen [Levofloxacin In D5w] Nausea And Vomiting and Rash  . Amber Christensen [Carisoprodol] Nausea And Vomiting and Rash  . Amber Christensen [Ondansetron Hcl] Nausea And Vomiting and Rash    Current antibiotics: Antibiotics  Given (last 72 hours)    Date/Time Action Medication Dose   04/20/16 2021 Given   doxycycline (Amber Christensen) tablet 100 mg 100 mg   04/21/16 0943 Given   doxycycline (Amber Christensen) tablet 100 mg 100 mg   04/21/16 2020 Given   doxycycline (Amber Christensen) tablet 100 mg 100 mg   04/22/16 0804 Given   doxycycline (Amber Christensen) tablet 100 mg 100 mg      MEDICATIONS: . amLODipine  5 mg Oral Daily  . aspirin EC  81 mg Oral Daily  . enoxaparin (Amber Christensen) injection  40 mg Subcutaneous Q24H  . insulin aspart  0-9 Units Subcutaneous TID WC  . insulin aspart  3 Units Subcutaneous TID WC  . insulin glargine  12 Units Subcutaneous Daily  . lisinopril  40 mg Oral Daily  . magnesium oxide  400 mg Oral TID  . pantoprazole  40 mg Oral BID  . potassium chloride SA  20 mEq Oral BID  . predniSONE  50 mg Oral Q breakfast  . vitamin E  400 Units Oral Daily    Review of Systems - 11 systems reviewed and negative per HPI   OBJECTIVE: Temp:  [97.9 F (36.6 C)-98.8 F (37.1 C)] 98.5 F (36.9 C) (03/09 0739) Pulse Rate:  [69-90] 73 (03/09 0739) Resp:  [16-18] 18 (03/09 0739) BP: (126-151)/(57-87) 126/57 (03/09 0739) SpO2:  [96 %-98 %]  97 % (03/09 0739) Physical Exam  Constitutional:  oriented to person, place, and time. appears well-developed and well-nourished. No distress.  HENT: [name]/AT, Amber Christensen, no scleral icterus Mouth/Throat: Oropharynx is clear and moist. No oropharyngeal exudate.  Cardiovascular: Normal rate, regular rhythm and normal heart sounds. Exam reveals no gallop and no friction rub.  No murmur heard.  Pulmonary/Chest: Effort normal and breath sounds normal. No respiratory distress.  has no wheezes.  Neck = supple, no nuchal rigidity Abdominal: Soft. Bowel sounds are normal.  exhibits no distension. There is no tenderness.  Lymphadenopathy: no cervical adenopathy. No axillary adenopathy Neurological: alert and oriented to person, place, and time.  Skin: band like lesion at sock line is  much improved, improved rash on forearms, back and chest Psychiatric: a normal mood and affect.  behavior is normal.    LABS: Results for orders placed or performed during the hospital encounter of 04/20/16 (from the past 48 hour(s))  Rocky mtn spotted fvr abs pnl(IgG+IgM)     Status: None   Collection Time: 04/20/16  3:34 PM  Result Value Ref Range   Amber Christensen IgG Negative Negative   Amber Christensen IgM 0.34 0.00 - 0.89 index    Comment: (NOTE)                                 Negative        <0.90                                 Equivocal 0.90 - 1.10                                 Positive        >1.10 Performed At: Amber Christensen, [place] [id number] Amber Romp MD Amber Christensen:[id number]   Rapid HIV screen (HIV 1/2 Ab+Ag)     Status: None   Collection Time: 04/20/16  3:34 PM  Result Value Ref Range   HIV-1 P24 Antigen - HIV24 NON REACTIVE NON REACTIVE   HIV 1/2 Antibodies NON REACTIVE NON REACTIVE   Interpretation (HIV Ag Ab)      A non reactive test result means that HIV 1 or HIV 2 antibodies and HIV 1 p24 antigen were not detected in the specimen.  RPR     Status: None   Collection Time: 04/20/16  3:34 PM  Result Value Ref Range   RPR Ser Ql Non Reactive Non Reactive    Comment: (NOTE) Performed At: [facility] [address] [place], [place] [id number] Amber Romp MD Amber Christensen:[id number]   Glucose, capillary     Status: Abnormal   Collection Time: 04/20/16  4:12 PM  Result Value Ref Range   Glucose-Capillary 348 (H) 65 - 99 mg/dL   Comment 1 Notify RN    Comment 2 Document in Chart   Glucose, capillary     Status: Abnormal   Collection Time: 04/20/16  9:10 PM  Result Value Ref Range   Glucose-Capillary 355 (H) 65 - 99 mg/dL  Glucose, capillary     Status: Abnormal   Collection Time: 04/21/16  7:25 AM  Result Value Ref Range   Glucose-Capillary 345 (H) 65 - 99 mg/dL   Comment 1 Notify RN    Comment 2 Document in Chart  Magnesium     Status:  Abnormal   Collection Time: 04/21/16 11:30 AM  Result Value Ref Range   Magnesium 1.4 (L) 1.7 - 2.4 mg/dL  Lupus anticoagulant panel     Status: None   Collection Time: 04/21/16 11:30 AM  Result Value Ref Range   PTT Lupus Anticoagulant 30.0 0.0 - 51.9 sec   DRVVT 25.5 0.0 - 47.0 sec   Lupus Anticoag Interp Comment:     Comment: (NOTE) No lupus anticoagulant was detected. Performed At: [facility] [place], [place] [id number] Amber Romp MD BS:[id number]   ANA Comprehensive Panel     Status: None   Collection Time: 04/21/16 11:30 AM  Result Value Ref Range   ds DNA Ab <1 0 - 9 IU/mL    Comment: (NOTE)                                   Negative      <5                                   Equivocal  5 - 9                                   Positive      >9    Ribonucleic Protein <0.2 0.0 - 0.9 AI   ENA SM Ab Ser-aCnc <0.2 0.0 - 0.9 AI   Scleroderma (Scl-70) (ENA) Antibody, IgG <0.2 0.0 - 0.9 AI   SSA (Ro) (ENA) Antibody, IgG <0.2 0.0 - 0.9 AI   SSB (La) (ENA) Antibody, IgG <0.2 0.0 - 0.9 AI   Chromatin Ab SerPl-aCnc <0.2 0.0 - 0.9 AI   Anti JO-1 <0.2 0.0 - 0.9 AI   Centromere Ab Screen <0.2 0.0 - 0.9 AI   See below: Comment     Comment: (NOTE) Autoantibody                       Disease Association ------------------------------------------------------------                        Condition                  Frequency ---------------------   ------------------------   --------- Antinuclear Antibody,    SLE, mixed connective Direct (ANA-D)           tissue diseases ---------------------   ------------------------   --------- dsDNA                    SLE                        40 - 60% ---------------------   ------------------------   --------- Chromatin                Drug induced SLE                90%                         SLE                        48 -  97% ---------------------   ------------------------   --------- SSA (Ro)                  SLE                        25 - 35%                         Sjogren's Syndrome         40 - 70%                         Neonatal Lupus                 100% ---------------------   ------------------------   --------- SSB (La)                 SLE                              10%                         Sjogren's Syndrome              30% ---------------------   -----------------------    --------- Sm (anti-Smith)          SLE                        15 - 30% ---------------------   -----------------------    --------- RNP                      Mixed Connective Tissue                         Disease                         95% (U1 nRNP,                SLE                        30 - 50% anti-ribonucleoprotein)  Polymyositis and/or                         Dermatomyositis                 20% ---------------------   ------------------------   --------- Scl-70 (antiDNA          Scleroderma (diffuse)      20 - 35% topoisomerase)           Crest                           13% ---------------------   ------------------------   --------- Jo-1                     Polymyositis and/or                         Dermatomyositis            20 - 40% ---------------------   ------------------------   --------- Centromere B  Scleroderma -  Crest                         variant                         80% Performed At: [facility] [place], [place] [id number] Amber Romp MD JO:[id number]   Glucose, capillary     Status: Abnormal   Collection Time: 04/21/16 11:37 AM  Result Value Ref Range   Glucose-Capillary 261 (H) 65 - 99 mg/dL   Comment 1 Notify RN    Comment 2 Document in Chart   Glucose, capillary     Status: Abnormal   Collection Time: 04/21/16  4:24 PM  Result Value Ref Range   Glucose-Capillary 254 (H) 65 - 99 mg/dL  Magnesium     Status: Abnormal   Collection Time: 04/21/16  8:52 PM  Result Value Ref Range   Magnesium 2.5 (H) 1.7 - 2.4 mg/dL   Potassium     Status: None   Collection Time: 04/21/16  8:52 PM  Result Value Ref Range   Potassium 3.9 3.5 - 5.1 mmol/L  Glucose, capillary     Status: Abnormal   Collection Time: 04/21/16  9:06 PM  Result Value Ref Range   Glucose-Capillary 216 (H) 65 - 99 mg/dL  CBC     Status: Abnormal   Collection Time: 04/22/16  4:30 AM  Result Value Ref Range   WBC 6.6 3.6 - 11.0 K/uL   RBC 3.38 (L) 3.80 - 5.20 MIL/uL   Hemoglobin 10.5 (L) 12.0 - 16.0 g/dL   HCT 30.8 (L) 35.0 - 47.0 %   MCV 91.1 80.0 - 100.0 fL   MCH 31.0 26.0 - 34.0 pg   MCHC 34.1 32.0 - 36.0 g/dL   RDW 14.7 (H) 11.5 - 14.5 %   Platelets 137 (L) 150 - 440 K/uL  Basic metabolic panel     Status: Abnormal   Collection Time: 04/22/16  4:30 AM  Result Value Ref Range   Sodium 135 135 - 145 mmol/L   Potassium 4.1 3.5 - 5.1 mmol/L   Chloride 104 101 - 111 mmol/L   CO2 27 22 - 32 mmol/L   Glucose, Bld 344 (H) 65 - 99 mg/dL   BUN 15 6 - 20 mg/dL   Creatinine, Ser 0.78 0.44 - 1.00 mg/dL   Calcium 7.1 (L) 8.9 - 10.3 mg/dL   GFR calc non Af Amer >60 >60 mL/min   GFR calc Af Amer >60 >60 mL/min    Comment: (NOTE) The eGFR has been calculated using the CKD EPI equation. This calculation has not been validated in all clinical situations. eGFR's persistently <60 mL/min signify possible Chronic Kidney Disease.    Anion gap 4 (L) 5 - 15  Phosphorus     Status: None   Collection Time: 04/22/16  4:30 AM  Result Value Ref Range   Phosphorus 2.7 2.5 - 4.6 mg/dL  Magnesium     Status: None   Collection Time: 04/22/16  4:30 AM  Result Value Ref Range   Magnesium 2.1 1.7 - 2.4 mg/dL  Glucose, capillary     Status: Abnormal   Collection Time: 04/22/16  7:45 AM  Result Value Ref Range   Glucose-Capillary 200 (H) 65 - 99 mg/dL  Glucose, capillary     Status: Abnormal   Collection Time: 04/22/16 11:32 AM  Result Value Ref Range  Glucose-Capillary 187 (H) 65 - 99 mg/dL   No components found for: ESR, C REACTIVE  PROTEIN MICRO: Recent Results (from the past 720 hour(s))  Blood culture (routine x 2)     Status: None (Preliminary result)   Collection Time: 04/19/16  7:06 PM  Result Value Ref Range Status   Specimen Description BLOOD L AC  Final   Special Requests BOTTLES DRAWN AEROBIC AND ANAEROBIC BCAV  Final   Culture NO GROWTH 3 DAYS  Final   Report Status PENDING  Incomplete  Blood culture (routine x 2)     Status: None (Preliminary result)   Collection Time: 04/19/16  7:06 PM  Result Value Ref Range Status   Specimen Description BLOOD L FOREARM  Final   Special Requests BOTTLES DRAWN AEROBIC AND ANAEROBIC  BCAV  Final   Culture NO GROWTH 3 DAYS  Final   Report Status PENDING  Incomplete    IMAGING: Dg Chest 2 View  Result Date: 04/06/2016 CLINICAL DATA:  Nausea. EXAM: CHEST  2 VIEW COMPARISON:  02/24/2016 . FINDINGS: Mediastinum and hilar structures normal. Lungs are clear. No pleural effusion or pneumothorax. Mild thoracic spine scoliosis. No acute bony abnormality identified . IMPRESSION: No acute cardiopulmonary disease. Electronically Signed   By: Marcello Moores  Register   On: 04/06/2016 12:10   US Venous Img Lower Unilateral Right  Result Date: 04/20/2016 CLINICAL DATA:  Right leg swelling for a day. EXAM: Right LOWER EXTREMITY VENOUS DOPPLER ULTRASOUND TECHNIQUE: Gray-scale sonography with graded compression, as well as color Doppler and duplex ultrasound were performed to evaluate the lower extremity deep venous systems from the level of the common femoral vein and including the common femoral, femoral, profunda femoral, popliteal and calf veins including the posterior tibial, peroneal and gastrocnemius veins when visible. The superficial great saphenous vein was also interrogated. Spectral Doppler was utilized to evaluate flow at rest and with distal augmentation maneuvers in the common femoral, femoral and popliteal veins. COMPARISON:  None. FINDINGS: Contralateral Common Femoral Vein:  Respiratory phasicity is normal and symmetric with the symptomatic side. No evidence of thrombus. Normal compressibility. Common Femoral Vein: No evidence of thrombus. Normal compressibility, respiratory phasicity and response to augmentation. Saphenofemoral Junction: No evidence of thrombus. Normal compressibility and flow on color Doppler imaging. Profunda Femoral Vein: No evidence of thrombus. Normal compressibility and flow on color Doppler imaging. Femoral Vein: No evidence of thrombus. Normal compressibility, respiratory phasicity and response to augmentation. Popliteal Vein: No evidence of thrombus. Normal compressibility, respiratory phasicity and response to augmentation. Calf Veins: No evidence of thrombus. Normal compressibility and flow on color Doppler imaging. Superficial Great Saphenous Vein: No evidence of thrombus. Normal compressibility and flow on color Doppler imaging. Venous Reflux:  None. Other Findings:  None. IMPRESSION: No evidence of deep venous thrombosis in the right lower extremity. Electronically Signed   By: Lorin Picket M.D.   On: 04/20/2016 12:32   Ct Angio Chest Aorta W And/or Wo Contrast  Result Date: 04/18/2016 CLINICAL DATA:  Generalized upper body pain EXAM: CT ANGIOGRAPHY CHEST WITH CONTRAST TECHNIQUE: Multidetector CT imaging of the chest was performed using the standard protocol during bolus administration of intravenous contrast. Multiplanar CT image reconstructions and MIPs were obtained to evaluate the vascular anatomy. CONTRAST:  75 cc Isovue 370 COMPARISON:  CT abdomen 10/28/2015 FINDINGS: Cardiovascular: There is no evidence of intramural hematoma, dissection, or aortic aneurysm. Great vessels are patent within the confines of the examination. There is no obvious acute pulmonary thromboembolism. Mediastinum/Nodes: No mediastinal  hemorrhage. No abnormal adenopathy. Lungs/Pleura: Lungs are clear. No pleural effusion or pneumothorax. Upper Abdomen: Diffuse hepatic  steatosis is noted. Gastroesophageal varices are stable. Musculoskeletal: No acute bony deformity or vertebral compression fracture. Review of the MIP images confirms the above findings. IMPRESSION: No evidence of aortic dissection or acute vascular pathology. Lungs are clear. Stable gastroesophageal varices. Electronically Signed   By: Marybelle Killings M.D.   On: 04/18/2016 08:27    Assessment:   Amber Christensen is a 51 y.o. female admitted with progressive rash. Bxp shows Leukocytoclastic vasculitis. Responded well to steroids. Cell counts and inflammatory markers not elevated.  Likely allergy, ? Latex  Recommendations No need for abx Cont steroid taper.  Thank you very much for allowing me to participate in the care of this patient. Please call with questions.   Cheral Marker. Ola Spurr, MD

## 2016-04-22 NOTE — Consult Note (Signed)
Pathology from right leg biopsy showed:  "Leukocytoclastic vasculitis, early changes.    Sections show neutrophils and nuclear debris around small blood vessels in the dermis. There are also extravasated erythrocytes. In earlier lesions of leukocytoclastic vasculitis, fibrin may not be present in the walls of venules as it is in more developed ones. Special stains (fite, PAS, and tissue Gram stain) all failed to reveal organisms (mycobacteria, fungi, and bacteria, respectively)."  Direct immunofluorescence was negative.    Clinical improvement observed with use of systemic steroids. The patient was notified of the result by Dr. Manuella Ghazi, and plans to follow up with me in clinic next Thursday.

## 2016-04-22 NOTE — Progress Notes (Addendum)
Inpatient Diabetes Program Recommendations  AACE/ADA: New Consensus Statement on Inpatient Glycemic Control (2015)  Target Ranges:  Prepandial:   less than 140 mg/dL      Peak postprandial:   less than 180 mg/dL (1-2 hours)      Critically ill patients:  140 - 180 mg/dL   Results for Amber Christensen, Amber Christensen (MRN [id number]) as of 04/22/2016 09:03  Ref. Range 04/21/2016 07:25 04/21/2016 11:37 04/21/2016 16:24 04/21/2016 21:06  Glucose-Capillary Latest Ref Range: 65 - 99 mg/dL 345 (H) 261 (H) 254 (H) 216 (H)   Results for Amber Christensen, Amber Christensen (MRN [id number]) as of 04/22/2016 09:03  Ref. 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  Fasting glucose still mildly elevated today.  May also require slightly more Novolog to cover meals.  Please consider the following adjustments:  1. Increase Lantus slightly to 14 units daily  2. Increase Novolog Meal Coverage slightly to: Novolog 4 units TID with meals (hold if pt eats <50% of meal)     --Will follow patient during hospitalization--  Wyn Quaker RN, MSN, CDE Diabetes Coordinator Inpatient Glycemic Control Team Team Pager: [phone] (8a-5p)

## 2016-04-23 LAB — ANTISTREPTOLYSIN O TITER: ASO: 180 IU/mL (ref 0.0–200.0)

## 2016-04-24 LAB — CULTURE, BLOOD (ROUTINE X 2)
Culture: NO GROWTH
Culture: NO GROWTH

## 2016-04-25 LAB — HUMAN PARVOVIRUS DNA DETECTION BY PCR: Parvovirus B19, PCR: NEGATIVE

## 2016-04-25 LAB — GLUCOSE, CAPILLARY: Glucose-Capillary: 298 mg/dL — ABNORMAL HIGH (ref 65–99)

## 2016-04-28 NOTE — Discharge Summary (Signed)
[facility] NAME: Amber Christensen    MR#:  [id number]  DATE OF BIRTH:  Dec 08, 1965  DATE OF ADMISSION:  04/20/2016   ADMITTING PHYSICIAN: Fritzi Mandes, MD  DATE OF DISCHARGE: 04/22/2016  4:30 PM  PRIMARY CARE PHYSICIAN: Singh,Jasmine, MD   ADMISSION DIAGNOSIS:  Allergic reaction  DISCHARGE DIAGNOSIS:  Active Problems:   Maculopapular rash, generalized   Acute maculopapular rash  SECONDARY DIAGNOSIS:   Past Medical History:  Diagnosis Date  . Diabetes mellitus without complication ([place])   . GERD (gastroesophageal reflux disease)   . Hypertension   . Liver disease   . Pancreatitis    HOSPITAL COURSE:  Amber Christensen a 51 y.o. 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 Patient's workup was done in the emergency room the first time sent home came back again with rash both upper and lower extremity around the ankle. Patient denies any tick bite, travel, trying a new food laundry detergent or any fragrance. Denies any such kind rash in the past.  1. Acute maculopapular rash over the chest back both upper and lower extremity associated with lower extremity edema and painful bilateral lower extremity  -skin biopsy indicative of early Leukocytoclastic Vasculitis per my d/w Dr Evorn Gong (Dermatology) - improved on steroids and will be D/C on Steroid taper per recommendations of Dermatology with outpt f/up with Dr Evorn Gong - Likely Latex Allergy  2.  Hypokalemia -Repleted  DISCHARGE CONDITIONS:  stable CONSULTS OBTAINED:  Treatment Team:  Leonel Ramsay, MD DRUG ALLERGIES:   Allergies  Allergen Reactions  . Latex Rash  . Demerol [Meperidine]  Other (See Comments)    Pt states that this medication causes cardiac arrest.    . Dilaudid [Hydromorphone Hcl] Other (See Comments)    Pt states that this medication causes cardiac arrest.    . Morphine And Related Other (See Comments)    Pt states that this medication causes cardiac arrest.    . Darvon [Propoxyphene] Nausea And Vomiting and Rash  . Flexeril [Cyclobenzaprine] Nausea And Vomiting and Rash  . Levaquin [Levofloxacin In D5w] Nausea And Vomiting and Rash  . Soma [Carisoprodol] Nausea And Vomiting and Rash  . Zofran [Ondansetron Hcl] Nausea And Vomiting and Rash   DISCHARGE MEDICATIONS:   Allergies as of 04/22/2016      Reactions   Latex Rash   Demerol [meperidine] Other (See Comments)   Pt states that this medication causes cardiac arrest.     Dilaudid [hydromorphone Hcl] Other (See Comments)   Pt states that this medication causes cardiac arrest.     Morphine And Related Other (See Comments)   Pt states that this medication causes cardiac arrest.     Darvon [propoxyphene] Nausea And Vomiting, Rash   Flexeril [cyclobenzaprine] Nausea And Vomiting, Rash   Levaquin [levofloxacin In D5w] Nausea And Vomiting, Rash   Soma [carisoprodol] Nausea And Vomiting, Rash   Zofran [ondansetron Hcl] Nausea And Vomiting, Rash      Medication List    TAKE these medications   amLODipine 5 MG tablet Commonly known as:  NORVASC Take 1 tablet (5 mg total) by mouth daily.   aspirin EC 81 MG tablet Take 81 mg by mouth daily.   insulin  lispro 100 UNIT/ML injection Commonly known as:  HUMALOG Inject 0.04 mLs (4 Units total) into the skin 3 (three) times daily with meals. Pt uses based off the amount of carbs she eats at each meal. What changed:  how much to take  additional instructions   lisinopril 20 MG tablet Commonly known as:  PRINIVIL,ZESTRIL Take 1 tablet (20 mg total) by mouth daily. What changed:  how much to take   Magnesium Oxide 500 MG Tabs Take 250 mg by mouth 3  (three) times daily.   oxyCODONE 5 MG immediate release tablet Commonly known as:  ROXICODONE Take 1 tablet (5 mg total) by mouth every 8 (eight) hours as needed.   oxyCODONE 5 MG immediate release tablet Commonly known as:  ROXICODONE Take 1 tablet (5 mg total) by mouth every 8 (eight) hours as needed.   pantoprazole 40 MG tablet Commonly known as:  PROTONIX Take 1 tablet (40 mg total) by mouth 2 (two) times daily.   potassium chloride SA 20 MEQ tablet Commonly known as:  K-DUR,KLOR-CON Take 1 tablet (20 mEq total) by mouth 2 (two) times daily.   potassium gluconate 595 (99 K) MG Tabs tablet Take 595 mg by mouth daily.   predniSONE 10 MG tablet Commonly known as:  DELTASONE Start 50 mg po daily for 1 week, taper 10 mg once a week till finish   promethazine 12.5 MG tablet Commonly known as:  PHENERGAN Take 1 tablet (12.5 mg total) by mouth every 6 (six) hours as needed for nausea or vomiting.   vitamin E 400 UNIT capsule Take 400 Units by mouth daily.      DISCHARGE INSTRUCTIONS:   DIET:  Regular diet DISCHARGE CONDITION:  Good ACTIVITY:  Activity as tolerated OXYGEN:  Home Oxygen: No.  Oxygen Delivery: room air DISCHARGE LOCATION:  home   If you experience worsening of your admission symptoms, develop shortness of breath, life threatening emergency, suicidal or homicidal thoughts you must seek medical attention immediately by calling 911 or calling your MD immediately  if symptoms less severe.  You Must read complete instructions/literature along with all the possible adverse reactions/side effects for all the Medicines you take and that have been prescribed to you. Take any new Medicines after you have completely understood and accpet all the possible adverse reactions/side effects.   Please note  You were cared for by a hospitalist during your hospital stay. If you have any questions about your discharge medications or the care you received while you were in  the hospital after you are discharged, you can call the unit and asked to speak with the hospitalist on call if the hospitalist that took care of you is not available. Once you are discharged, your primary care physician will handle any further medical issues. Please note that NO REFILLS for any discharge medications will be authorized once you are discharged, as it is imperative that you return to your primary care physician (or establish a relationship with a primary care physician if you do not have one) for your aftercare needs so that they can reassess your need for medications and monitor your lab values.    On the day of Discharge:  VITAL SIGNS:  Blood pressure (!) 126/57, pulse 73, temperature 98.5 F (36.9 C), temperature source Oral, resp. rate 18, height 5' 4"  (1.626 m), weight 56.2 kg (123 lb 14.4 oz), SpO2 97 %. PHYSICAL EXAMINATION:  GENERAL:  51 y.o.-year-old patient lying in the bed with no acute distress.  EYES: Pupils equal, round, reactive to light and accommodation. No scleral icterus. Extraocular muscles intact.  HEENT: Head atraumatic, normocephalic. Oropharynx and nasopharynx clear.  NECK:  Supple, no jugular venous distention. No thyroid enlargement, no tenderness.  LUNGS: Normal breath sounds bilaterally, no wheezing, rales,rhonchi or crepitation. No use of accessory muscles of respiration.  CARDIOVASCULAR: S1, S2 normal. No murmurs, rubs, or gallops.  ABDOMEN: Soft, non-tender, non-distended. Bowel sounds present. No organomegaly or mass.  EXTREMITIES: No pedal edema, cyanosis, or clubbing.  NEUROLOGIC: Cranial nerves II through XII are intact. Muscle strength 5/5 in all extremities. Sensation intact. Gait not checked.  PSYCHIATRIC: The patient is alert and oriented x 3.  SKIN: No obvious rash, lesion, or ulcer.  DATA REVIEW:   CBC  Recent Labs Lab 04/22/16 0430  WBC 6.6  HGB 10.5*  HCT 30.8*  PLT 137*    Chemistries   Recent Labs Lab 04/22/16 0430  NA  135  K 4.1  CL 104  CO2 27  GLUCOSE 344*  BUN 15  CREATININE 0.78  CALCIUM 7.1*  MG 2.1     Follow-up Information    Kennieth Francois, MD. Schedule an appointment as soon as possible for a visit on 04/28/2016.   Specialty:  Dermatology Why:  @ 12:45 pm.  Contact information: [place] [id number] [phone]            Management plans discussed with the patient, family and they are in agreement.  CODE STATUS: Prior   TOTAL TIME TAKING CARE OF THIS PATIENT: 45 minutes.    Max Sane M.D on 04/28/2016 at 10:51 PM  Between 7am to 6pm - Pager - [phone]  After 6pm go to [email or web address] - Proofreader  [facility]  Office  [phone]  CC: Primary care physician; Glendon Axe, MD   Note: This dictation was prepared with Dragon dictation along with smaller phrase technology. Any transcriptional errors that result from this process are unintentional.

## 2016-05-15 DIAGNOSIS — E108 Type 1 diabetes mellitus with unspecified complications: Secondary | ICD-10-CM

## 2016-05-15 HISTORY — DX: Type 1 diabetes mellitus with unspecified complications: E10.8

## 2016-05-16 DIAGNOSIS — M542 Cervicalgia: Secondary | ICD-10-CM | POA: Insufficient documentation

## 2016-06-10 DIAGNOSIS — E876 Hypokalemia: Secondary | ICD-10-CM | POA: Insufficient documentation

## 2016-08-03 ENCOUNTER — Encounter: Payer: Self-pay | Admitting: Emergency Medicine

## 2016-08-03 ENCOUNTER — Inpatient Hospital Stay
Admission: EM | Admit: 2016-08-03 | Discharge: 2016-08-06 | DRG: 546 | Disposition: A | Discharge status: Home / Self Care | Payer: Medicare Other | Attending: Internal Medicine | Admitting: Internal Medicine

## 2016-08-03 DIAGNOSIS — K861 Other chronic pancreatitis: Secondary | ICD-10-CM | POA: Diagnosis present

## 2016-08-03 DIAGNOSIS — Z91040 Latex allergy status: Secondary | ICD-10-CM | POA: Diagnosis not present

## 2016-08-03 DIAGNOSIS — I10 Essential (primary) hypertension: Secondary | ICD-10-CM | POA: Diagnosis present

## 2016-08-03 DIAGNOSIS — Z7982 Long term (current) use of aspirin: Secondary | ICD-10-CM

## 2016-08-03 DIAGNOSIS — Z888 Allergy status to other drugs, medicaments and biological substances status: Secondary | ICD-10-CM | POA: Diagnosis not present

## 2016-08-03 DIAGNOSIS — Z881 Allergy status to other antibiotic agents status: Secondary | ICD-10-CM

## 2016-08-03 DIAGNOSIS — R1310 Dysphagia, unspecified: Secondary | ICD-10-CM | POA: Diagnosis present

## 2016-08-03 DIAGNOSIS — I776 Arteritis, unspecified: Secondary | ICD-10-CM | POA: Diagnosis present

## 2016-08-03 DIAGNOSIS — K219 Gastro-esophageal reflux disease without esophagitis: Secondary | ICD-10-CM | POA: Diagnosis present

## 2016-08-03 DIAGNOSIS — T380X5A Adverse effect of glucocorticoids and synthetic analogues, initial encounter: Secondary | ICD-10-CM | POA: Diagnosis present

## 2016-08-03 DIAGNOSIS — Z79899 Other long term (current) drug therapy: Secondary | ICD-10-CM | POA: Diagnosis not present

## 2016-08-03 DIAGNOSIS — E876 Hypokalemia: Secondary | ICD-10-CM | POA: Diagnosis present

## 2016-08-03 DIAGNOSIS — Z9641 Presence of insulin pump (external) (internal): Secondary | ICD-10-CM | POA: Diagnosis present

## 2016-08-03 DIAGNOSIS — T503X5A Adverse effect of electrolytic, caloric and water-balance agents, initial encounter: Secondary | ICD-10-CM | POA: Diagnosis not present

## 2016-08-03 DIAGNOSIS — K7460 Unspecified cirrhosis of liver: Secondary | ICD-10-CM | POA: Diagnosis present

## 2016-08-03 DIAGNOSIS — E119 Type 2 diabetes mellitus without complications: Secondary | ICD-10-CM

## 2016-08-03 DIAGNOSIS — Z885 Allergy status to narcotic agent status: Secondary | ICD-10-CM

## 2016-08-03 DIAGNOSIS — Z90411 Acquired partial absence of pancreas: Secondary | ICD-10-CM

## 2016-08-03 DIAGNOSIS — M310 Hypersensitivity angiitis: Secondary | ICD-10-CM | POA: Diagnosis present

## 2016-08-03 DIAGNOSIS — E1165 Type 2 diabetes mellitus with hyperglycemia: Secondary | ICD-10-CM | POA: Diagnosis present

## 2016-08-03 DIAGNOSIS — Z4681 Encounter for fitting and adjustment of insulin pump: Secondary | ICD-10-CM | POA: Diagnosis not present

## 2016-08-03 DIAGNOSIS — K7581 Nonalcoholic steatohepatitis (NASH): Secondary | ICD-10-CM | POA: Diagnosis present

## 2016-08-03 DIAGNOSIS — Y92230 Patient room in hospital as the place of occurrence of the external cause: Secondary | ICD-10-CM | POA: Diagnosis not present

## 2016-08-03 DIAGNOSIS — Z87891 Personal history of nicotine dependence: Secondary | ICD-10-CM

## 2016-08-03 DIAGNOSIS — Z794 Long term (current) use of insulin: Secondary | ICD-10-CM

## 2016-08-03 DIAGNOSIS — F319 Bipolar disorder, unspecified: Secondary | ICD-10-CM | POA: Diagnosis present

## 2016-08-03 LAB — CBC
HCT: 35.7 % (ref 35.0–47.0)
Hemoglobin: 12.5 g/dL (ref 12.0–16.0)
MCH: 30.8 pg (ref 26.0–34.0)
MCHC: 35.1 g/dL (ref 32.0–36.0)
MCV: 87.9 fL (ref 80.0–100.0)
Platelets: 215 10*3/uL (ref 150–440)
RBC: 4.06 MIL/uL (ref 3.80–5.20)
RDW: 13.4 % (ref 11.5–14.5)
WBC: 10 10*3/uL (ref 3.6–11.0)

## 2016-08-03 LAB — COMPREHENSIVE METABOLIC PANEL
ALT: 24 U/L (ref 14–54)
AST: 26 U/L (ref 15–41)
Albumin: 4.4 g/dL (ref 3.5–5.0)
Alkaline Phosphatase: 118 U/L (ref 38–126)
Anion gap: 9 (ref 5–15)
BUN: 11 mg/dL (ref 6–20)
CO2: 26 mmol/L (ref 22–32)
Calcium: 8.3 mg/dL — ABNORMAL LOW (ref 8.9–10.3)
Chloride: 101 mmol/L (ref 101–111)
Creatinine, Ser: 0.71 mg/dL (ref 0.44–1.00)
GFR calc Af Amer: >60 mL/min (ref >60)
GFR calc non Af Amer: >60 mL/min (ref >60)
Glucose, Bld: 292 mg/dL — ABNORMAL HIGH (ref 65–99)
Potassium: 2.6 mmol/L — CL (ref 3.5–5.1)
Sodium: 136 mmol/L (ref 135–145)
Total Bilirubin: 6.4 mg/dL — ABNORMAL HIGH (ref 0.3–1.2)
Total Protein: 7.1 g/dL (ref 6.5–8.1)

## 2016-08-03 LAB — GLUCOSE, CAPILLARY
Glucose-Capillary: 217 mg/dL — ABNORMAL HIGH (ref 65–99)
Glucose-Capillary: 259 mg/dL — ABNORMAL HIGH (ref 65–99)
Glucose-Capillary: 466 mg/dL — ABNORMAL HIGH (ref 65–99)
Glucose-Capillary: 245 mg/dL — ABNORMAL HIGH (ref 65–99)

## 2016-08-03 LAB — TSH: TSH: 1.363 u[IU]/mL (ref 0.350–4.500)

## 2016-08-03 LAB — MAGNESIUM: Magnesium: 1.1 mg/dL — ABNORMAL LOW (ref 1.7–2.4)

## 2016-08-03 MED ORDER — INSULIN ASPART 100 UNIT/ML ~~LOC~~ SOLN: 0.0000 [IU] | Freq: Three times a day (TID) | SUBCUTANEOUS | Status: DC

## 2016-08-03 MED ORDER — DEXAMETHASONE SODIUM PHOSPHATE 10 MG/ML IJ SOLN
10.0000 mg | Freq: Once | INTRAMUSCULAR | Status: AC
  Administered 2016-08-03: 10 mg via INTRAVENOUS
  Filled 2016-08-03: qty 1

## 2016-08-03 MED ORDER — ASPIRIN EC 81 MG PO TBEC
81.0000 mg | DELAYED_RELEASE_TABLET | Freq: Every day | ORAL | Status: DC
  Administered 2016-08-04 – 2016-08-06 (×3): 81 mg via ORAL
  Filled 2016-08-03 (×3): qty 1

## 2016-08-03 MED ORDER — VITAMIN E 180 MG (400 UNIT) PO CAPS
400.0000 [IU] | ORAL_CAPSULE | Freq: Every day | ORAL | Status: DC
  Administered 2016-08-04 – 2016-08-06 (×3): 400 [IU] via ORAL
  Filled 2016-08-03 (×3): qty 1

## 2016-08-03 MED ORDER — ACETAMINOPHEN 325 MG PO TABS: 650.0000 mg | ORAL_TABLET | Freq: Four times a day (QID) | ORAL | Status: DC | PRN

## 2016-08-03 MED ORDER — ACETAMINOPHEN 650 MG RE SUPP: 650.0000 mg | Freq: Four times a day (QID) | RECTAL | Status: DC | PRN

## 2016-08-03 MED ORDER — POTASSIUM CHLORIDE CRYS ER 20 MEQ PO TBCR
40.0000 meq | EXTENDED_RELEASE_TABLET | Freq: Once | ORAL | Status: AC
  Administered 2016-08-03: 40 meq via ORAL
  Filled 2016-08-03: qty 2

## 2016-08-03 MED ORDER — POTASSIUM CHLORIDE IN NACL 20-0.9 MEQ/L-% IV SOLN
Freq: Once | INTRAVENOUS | Status: AC
  Administered 2016-08-03: 14:00:00 via INTRAVENOUS
  Filled 2016-08-03: qty 1000

## 2016-08-03 MED ORDER — PREDNISONE 20 MG PO TABS
50.0000 mg | ORAL_TABLET | Freq: Every day | ORAL | Status: DC
  Administered 2016-08-04: 09:00:00 50 mg via ORAL
  Filled 2016-08-03: qty 2

## 2016-08-03 MED ORDER — AMLODIPINE BESYLATE 5 MG PO TABS
10.0000 mg | ORAL_TABLET | Freq: Every day | ORAL | Status: DC
  Administered 2016-08-04 – 2016-08-06 (×3): 10 mg via ORAL
  Filled 2016-08-03 (×3): qty 2

## 2016-08-03 MED ORDER — HYDROCODONE-ACETAMINOPHEN 7.5-325 MG PO TABS
1.0000 | ORAL_TABLET | ORAL | Status: DC | PRN
  Administered 2016-08-03 – 2016-08-06 (×5): 1 via ORAL
  Filled 2016-08-03 (×5): qty 1

## 2016-08-03 MED ORDER — MAGNESIUM OXIDE 400 (241.3 MG) MG PO TABS
400.0000 mg | ORAL_TABLET | Freq: Every day | ORAL | Status: AC
  Administered 2016-08-03 – 2016-08-04 (×2): 400 mg via ORAL
  Filled 2016-08-03 (×2): qty 1

## 2016-08-03 MED ORDER — ALPRAZOLAM 0.5 MG PO TABS
0.5000 mg | ORAL_TABLET | Freq: Three times a day (TID) | ORAL | Status: DC | PRN
  Administered 2016-08-03 – 2016-08-06 (×3): 0.5 mg via ORAL
  Filled 2016-08-03 (×4): qty 1

## 2016-08-03 MED ORDER — DOCUSATE SODIUM 100 MG PO CAPS
100.0000 mg | ORAL_CAPSULE | Freq: Two times a day (BID) | ORAL | Status: DC
  Administered 2016-08-04 – 2016-08-06 (×4): 100 mg via ORAL
  Filled 2016-08-03 (×6): qty 1

## 2016-08-03 MED ORDER — POTASSIUM CHLORIDE CRYS ER 20 MEQ PO TBCR
20.0000 meq | EXTENDED_RELEASE_TABLET | Freq: Two times a day (BID) | ORAL | Status: DC
  Administered 2016-08-03 – 2016-08-06 (×6): 20 meq via ORAL
  Filled 2016-08-03 (×6): qty 1

## 2016-08-03 MED ORDER — PANTOPRAZOLE SODIUM 40 MG PO TBEC
40.0000 mg | DELAYED_RELEASE_TABLET | Freq: Two times a day (BID) | ORAL | Status: DC
  Administered 2016-08-03 – 2016-08-06 (×6): 40 mg via ORAL
  Filled 2016-08-03 (×6): qty 1

## 2016-08-03 MED ORDER — INSULIN GLARGINE 100 UNIT/ML ~~LOC~~ SOLN
15.0000 [IU] | Freq: Every day | SUBCUTANEOUS | Status: DC
  Filled 2016-08-03: qty 0.15

## 2016-08-03 MED ORDER — VITAMIN D 1000 UNITS PO TABS
1000.0000 [IU] | ORAL_TABLET | Freq: Every day | ORAL | Status: DC
  Administered 2016-08-04 – 2016-08-06 (×3): 1000 [IU] via ORAL
  Filled 2016-08-03 (×3): qty 1

## 2016-08-03 MED ORDER — POTASSIUM CHLORIDE IN NACL 20-0.9 MEQ/L-% IV SOLN
INTRAVENOUS | Status: DC
  Administered 2016-08-04: 02:00:00 via INTRAVENOUS
  Filled 2016-08-03 (×3): qty 1000

## 2016-08-03 MED ORDER — INSULIN ASPART 100 UNIT/ML ~~LOC~~ SOLN: 0.0000 [IU] | Freq: Every day | SUBCUTANEOUS | Status: DC

## 2016-08-03 MED ORDER — LISINOPRIL 10 MG PO TABS
40.0000 mg | ORAL_TABLET | Freq: Every day | ORAL | Status: DC
  Administered 2016-08-04 – 2016-08-06 (×3): 40 mg via ORAL
  Filled 2016-08-03 (×3): qty 4

## 2016-08-03 MED ORDER — ENOXAPARIN SODIUM 40 MG/0.4ML ~~LOC~~ SOLN
40.0000 mg | SUBCUTANEOUS | Status: DC
  Administered 2016-08-03 – 2016-08-05 (×3): 40 mg via SUBCUTANEOUS
  Filled 2016-08-03 (×3): qty 0.4

## 2016-08-03 MED ORDER — HYDRALAZINE HCL 20 MG/ML IJ SOLN
10.0000 mg | Freq: Four times a day (QID) | INTRAMUSCULAR | Status: DC | PRN
  Administered 2016-08-03: 19:00:00 10 mg via INTRAVENOUS
  Filled 2016-08-03: qty 1

## 2016-08-03 MED ORDER — POLYETHYLENE GLYCOL 3350 17 G PO PACK: 17.0000 g | PACK | Freq: Every day | ORAL | Status: DC | PRN

## 2016-08-03 MED ORDER — INSULIN PUMP
Freq: Three times a day (TID) | SUBCUTANEOUS | Status: DC
  Administered 2016-08-03: 18:00:00 2.9 via SUBCUTANEOUS
  Administered 2016-08-03: 4.5 via SUBCUTANEOUS
  Administered 2016-08-04 (×3): via SUBCUTANEOUS
  Administered 2016-08-04: 2.5 via SUBCUTANEOUS
  Administered 2016-08-05: 12:00:00 6.7 via SUBCUTANEOUS
  Administered 2016-08-05: 1.7 via SUBCUTANEOUS
  Administered 2016-08-05 (×2): via SUBCUTANEOUS
  Administered 2016-08-06: 5.7 via SUBCUTANEOUS
  Administered 2016-08-06: 12:00:00 6 via SUBCUTANEOUS
  Administered 2016-08-06: 03:00:00 via SUBCUTANEOUS
  Filled 2016-08-03: qty 1

## 2016-08-03 MED ORDER — MAGNESIUM SULFATE 2 GM/50ML IV SOLN
2.0000 g | Freq: Once | INTRAVENOUS | Status: AC
  Administered 2016-08-03: 2 g via INTRAVENOUS
  Filled 2016-08-03: qty 50

## 2016-08-03 MED ORDER — PROMETHAZINE HCL 25 MG/ML IJ SOLN
12.5000 mg | Freq: Four times a day (QID) | INTRAMUSCULAR | Status: DC | PRN
  Filled 2016-08-03: qty 1

## 2016-08-03 NOTE — ED Triage Notes (Signed)
Pt reports ongoing problems with vasculitis. Pt reports pain and itching have increased. Rash on lower legs has now spread to arms and abdomen. Pt reports headache today. Pt reports loose stools. Pt has been taking prednisone as ordered.

## 2016-08-03 NOTE — ED Provider Notes (Signed)
[facility] Emergency Department Provider Note       Time seen: ----------------------------------------- 1:53 PM on 08/03/2016 -----------------------------------------     I have reviewed the triage vital signs and the nursing notes.   HISTORY   Chief Complaint Vasculitis    HPI Amber Christensen is a 51 y.o. female who presents to the ED for ongoing problems with vasculitis. Patient describes pain and itching to her arms and legs and a recurrent rash on the legs that has now spread to her abdomen. She reports a headache today with diarrhea, she's been taking prednisone as ordered. She has had extensive workups recently and was negative for) spotted fever, parvovirus, ANA and had a biopsy which revealed vasculitis of undetermined etiology.   Past Medical History:  Diagnosis Date  . Diabetes mellitus without complication ([place])   . GERD (gastroesophageal reflux disease)   . Hypertension   . Liver disease   . Pancreatitis     Patient Active Problem List   Diagnosis Date Noted  . Acute maculopapular rash 04/21/2016  . Maculopapular rash, generalized 04/20/2016  . Unstable angina ([place]) 09/28/2015    Past Surgical History:  Procedure Laterality Date  . ABDOMINAL HYSTERECTOMY    . APPENDECTOMY    . BLADDER SURGERY    . BOWEL RESECTION    . BREAST BIOPSY Left 1989  . CHOLECYSTECTOMY    . CYST REMOVAL HAND Left 1992   Ganglion cyst removed from Left Wrist  . DILATATION & CURETTAGE/HYSTEROSCOPY WITH MYOSURE    . KNEE ARTHROSCOPY Left 1992  . PANCREAS SURGERY    . REPAIR OF ESOPHAGUS  2012   3 clamps placed in esophagus    Allergies Latex; Demerol [meperidine]; Dilaudid [hydromorphone hcl]; Morphine and related; Darvon [propoxyphene]; Flexeril [cyclobenzaprine]; Levaquin [levofloxacin in d5w]; Soma [carisoprodol]; and Zofran Alvis Lemmings hcl]  Social History Social History  Substance Use Topics  . Smoking status: Former Smoker    Types:  Cigarettes  . Smokeless tobacco: Former Systems developer  . Alcohol use No    Review of Systems Constitutional: Negative for fever. Eyes: Negative for vision changes ENT:  Negative for congestion, sore throat Cardiovascular: Negative for chest pain. Respiratory: Negative for shortness of breath. Gastrointestinal: Negative for abdominal pain, vomiting and diarrhea. Genitourinary: Negative for dysuria. Musculoskeletal: Positive for pain in the arms and legs Skin:Positive for rash Neurological: Positive for headache, weakness  All systems negative/normal/unremarkable except as stated in the HPI  ____________________________________________   PHYSICAL EXAM:  VITAL SIGNS: ED Triage Vitals  Enc Vitals Group     BP 08/03/16 1240 (!) 144/75     Pulse Rate 08/03/16 1240 (!) 114     Resp 08/03/16 1240 18     Temp 08/03/16 1240 98 F (36.7 C)     Temp Source 08/03/16 1240 Oral     SpO2 08/03/16 1240 99 %     Weight 08/03/16 1240 134 lb (60.8 kg)     Height 08/03/16 1240 5' 3"  (1.6 m)     Head Circumference --      Peak Flow --      Pain Score 08/03/16 1239 8     Pain Loc --      Pain Edu? --      Excl. in [place]? --     Constitutional: Alert and oriented. Well appearing and in no distress. Eyes: Conjunctivae are normal. Normal extraocular movements. ENT   Head: Normocephalic and atraumatic.   Nose: No congestion/rhinnorhea.   Mouth/Throat: Mucous membranes are  moist.   Neck: No stridor. Cardiovascular: Normal rate, regular rhythm. No murmurs, rubs, or gallops. Respiratory: Normal respiratory effort without tachypnea nor retractions. Breath sounds are clear and equal bilaterally. No wheezes/rales/rhonchi. Gastrointestinal: Soft and nontender. Normal bowel sounds Musculoskeletal: Nontender with normal range of motion in extremities. No lower extremity tenderness nor edema. Neurologic:  Normal speech and language. No gross focal neurologic deficits are appreciated.  Skin:   Vasculitic rash around the ankles bilaterally, there is some erythema in both forearms and across the upper abdomen. No petechial lesions are identified this time Psychiatric: Mood and affect are normal. Speech and behavior are normal.  ____________________________________________  EKG: Interpreted by me. Sinus rhythm with a rate of 86 bpm, normal PR interval, normal QRS, normal QT.  ____________________________________________  ED COURSE:  Pertinent labs & imaging results that were available during my care of the patient were reviewed by me and considered in my medical decision making (see chart for details). Patient presents for -, we will assess with labs and imaging as indicated.   Procedures ____________________________________________   LABS (pertinent positives/negatives)  Labs Reviewed  COMPREHENSIVE METABOLIC PANEL - Abnormal; Notable for the following:       Result Value   Potassium 2.6 (*)    Glucose, Bld 292 (*)    Calcium 8.3 (*)    Total Bilirubin 6.4 (*)    All other components within normal limits  CBC  MAGNESIUM   ____________________________________________  FINAL ASSESSMENT AND PLAN  Vasculitis, Hypokalemia  Plan: Patient's labs and imaging were dictated above. Patient had presented for Vasculitis steroid therapy. Vasculitis or an IV potassium repletion. I have discussed with her primary care doctor agrees with the plan.   Earleen Newport, MD   Note: This note was generated in part or whole with voice recognition software. Voice recognition is usually quite accurate but there are transcription errors that can and very often do occur. I apologize for any typographical errors that were not detected and corrected.     Earleen Newport, MD 08/03/16 1416

## 2016-08-03 NOTE — H&P (Signed)
[facility] [place] NAME: Amber Christensen    MR#:  [id number]  DATE OF BIRTH:  1965/12/13  DATE OF ADMISSION:  08/03/2016  PRIMARY CARE PHYSICIAN: Glendon Axe, MD   REQUESTING/REFERRING PHYSICIAN: Dr. Lenise Arena  CHIEF COMPLAINT:   Chief Complaint  Patient presents with  . Vasculitis    HISTORY OF PRESENT ILLNESS:  Amber Christensen  is a 51 y.o. female with a known history of Multiple medical problems including chronic pancreatitis secondary to liver disease, status post subtotal pancreatectomy, hypertension, diabetes mellitus since her pancreatic surgery, reflux, bipolar disorder presents to hospital secondary to worsening of lower extremity swelling and also rash. Patient was admitted to the hospital about 3 months ago due to lower extremity rash and was diagnosed with biopsy confirmed vasculitis at the time. Seen by dermatology and rheumatology. Since then she has been having similar macular rash on her lower extremities while off of prednisone. Her PCP has treated her with tapers of prednisone in the last few weeks. She just finished a course of prednisone, 3 days later started noticing rash with increased swelling of lower extremities and now the rash is also on her abdomen and her arms. She started her when necessary prednisone taper about 2 days ago. Her pain and swelling of her legs were getting worse so she presented to the emergency room. Noted to have a low potassium at this time.  PAST MEDICAL HISTORY:   Past Medical History:  Diagnosis Date  . Diabetes mellitus without complication ([place])   . GERD (gastroesophageal reflux disease)   . Hypertension   . Liver disease   . Pancreatitis     PAST SURGICAL HISTORY:   Past Surgical History:  Procedure Laterality Date  . ABDOMINAL HYSTERECTOMY    . APPENDECTOMY    . BLADDER SURGERY    . BOWEL RESECTION    . BREAST BIOPSY Left 1989  . CHOLECYSTECTOMY    . CYST REMOVAL  HAND Left 1992   Ganglion cyst removed from Left Wrist  . DILATATION & CURETTAGE/HYSTEROSCOPY WITH MYOSURE    . KNEE ARTHROSCOPY Left 1992  . PANCREAS SURGERY    . REPAIR OF ESOPHAGUS  2012   3 clamps placed in esophagus    SOCIAL HISTORY:   Social History  Substance Use Topics  . Smoking status: Former Smoker    Types: Cigarettes  . Smokeless tobacco: Former Systems developer  . Alcohol use No    FAMILY HISTORY:   Family History  Problem Relation Age of Onset  . Cirrhosis Mother   . Colon cancer Mother     DRUG ALLERGIES:   Allergies  Allergen Reactions  . Latex Rash  . Demerol [Meperidine] Other (See Comments)    Pt states that this medication causes cardiac arrest.    . Dilaudid [Hydromorphone Hcl] Other (See Comments)    Pt states that this medication causes cardiac arrest.    . Morphine And Related Other (See Comments)    Pt states that this medication causes cardiac arrest.    . Darvon [Propoxyphene] Nausea And Vomiting and Rash  . Flexeril [Cyclobenzaprine] Nausea And Vomiting and Rash  . Levaquin [Levofloxacin In D5w] Nausea And Vomiting and Rash  . Soma [Carisoprodol] Nausea And Vomiting and Rash  . Zofran [Ondansetron Hcl] Nausea And Vomiting and Rash    REVIEW OF SYSTEMS:   Review of Systems  Constitutional: Positive for malaise/fatigue. Negative for chills, fever and weight loss.  HENT: Negative  for ear discharge, ear pain, hearing loss and nosebleeds.   Eyes: Negative for blurred vision, double vision and photophobia.  Respiratory: Positive for shortness of breath. Negative for cough, hemoptysis and wheezing.   Cardiovascular: Positive for chest pain and palpitations. Negative for orthopnea and leg swelling.  Gastrointestinal: Negative for abdominal pain, constipation, diarrhea, heartburn, melena, nausea and vomiting.  Genitourinary: Negative for dysuria, frequency, hematuria and urgency.  Musculoskeletal: Negative for back pain, myalgias and neck pain.  Skin:  Positive for rash.  Neurological: Negative for dizziness, sensory change, speech change, focal weakness and headaches.  Endo/Heme/Allergies: Does not bruise/bleed easily.  Psychiatric/Behavioral: Negative for depression.    MEDICATIONS AT HOME:   Prior to Admission medications   Medication Sig Start Date End Date Taking? Authorizing Provider  amLODipine (NORVASC) 10 MG tablet Take 10 mg by mouth daily. 06/23/16  Yes [provider]  aspirin EC 81 MG tablet Take 81 mg by mouth daily.   Yes [provider]  CALCIUM-MAGNESIUM-ZINC PO Take 1 tablet by mouth daily.   Yes [provider]  Cholecalciferol (D3-1000 PO) Take 1,000 Units by mouth daily.   Yes [provider]  HYDROcodone-acetaminophen (NORCO) 7.5-325 MG tablet Take 1 tablet by mouth every 4 (four) hours as needed for moderate pain.   Yes [provider]  lisinopril (PRINIVIL,ZESTRIL) 40 MG tablet Take 40 mg by mouth daily. 05/25/16  Yes [provider]  NOVOLOG 100 UNIT/ML injection 40 Units by Other route daily. Use up to 40 units daily via insulin pump. 07/24/16  Yes [provider]  oxyCODONE (ROXICODONE) 5 MG immediate release tablet Take 1 tablet (5 mg total) by mouth every 8 (eight) hours as needed. 04/19/16 04/19/17 Yes Harvest Dark, MD  pantoprazole (PROTONIX) 40 MG tablet Take 1 tablet (40 mg total) by mouth 2 (two) times daily. 09/29/15  Yes Sainani, Belia Heman, MD  potassium chloride SA (K-DUR,KLOR-CON) 20 MEQ tablet Take 1 tablet (20 mEq total) by mouth 2 (two) times daily. 04/19/16  Yes Harvest Dark, MD  potassium gluconate 595 (99 K) MG TABS tablet Take 595 mg by mouth daily.   Yes [provider]  predniSONE (DELTASONE) 10 MG tablet Start 50 mg po daily for 1 week, taper 10 mg once a week till finish 04/22/16  Yes Max Sane, MD  promethazine (PHENERGAN) 12.5 MG tablet Take 1 tablet (12.5 mg total) by mouth every 6 (six) hours as needed for nausea or  vomiting. 04/06/16  Yes Merlyn Lot, MD  vitamin E 400 UNIT capsule Take 400 Units by mouth daily.   Yes [provider]  amLODipine (NORVASC) 5 MG tablet Take 1 tablet (5 mg total) by mouth daily. Patient not taking: Reported on 08/03/2016 09/29/15   Henreitta Leber, MD  insulin lispro (HUMALOG) 100 UNIT/ML injection Inject 0.04 mLs (4 Units total) into the skin 3 (three) times daily with meals. Pt uses based off the amount of carbs she eats at each meal. Patient not taking: Reported on 08/03/2016 09/29/15   Henreitta Leber, MD  lisinopril (PRINIVIL,ZESTRIL) 20 MG tablet Take 1 tablet (20 mg total) by mouth daily. Patient not taking: Reported on 08/03/2016 09/29/15   Henreitta Leber, MD  oxyCODONE (ROXICODONE) 5 MG immediate release tablet Take 1 tablet (5 mg total) by mouth every 8 (eight) hours as needed. Patient not taking: Reported on 08/03/2016 10/21/15 10/20/16  Earleen Newport, MD      VITAL SIGNS:  Blood pressure (!) 144/75, pulse 84,  temperature 98 F (36.7 C), temperature source Oral, resp. rate (!) 22, height 5' 3"  (1.6 m), weight 60.8 kg (134 lb), SpO2 100 %.  PHYSICAL EXAMINATION:   Physical Exam  GENERAL:  51 y.o.-year-old patient lying in the bed with no acute distress.  EYES: Pupils equal, round, reactive to light and accommodation. No scleral icterus. Extraocular muscles intact.  HEENT: Head atraumatic, normocephalic. Oropharynx and nasopharynx clear.  NECK:  Supple, no jugular venous distention. No thyroid enlargement, no tenderness.  LUNGS: Normal breath sounds bilaterally, no wheezing, rales,rhonchi or crepitation. No use of accessory muscles of respiration.  CARDIOVASCULAR: S1, S2 normal. No murmurs, rubs, or gallops.  ABDOMEN: Soft, nontender, nondistended. Bowel sounds present. No organomegaly or mass.  EXTREMITIES: No p cyanosis, or clubbing. 2+ bilateral pedal edema noted NEUROLOGIC: Cranial nerves II through XII are intact. Muscle strength 5/5 in all  extremities. Sensation intact. Gait not checked.  PSYCHIATRIC: The patient is alert and oriented x 3.  SKIN: Erythematous macular confluent rash on both lower extremities below the knees and also very light colored rash on both forearms.   LABORATORY PANEL:   CBC  Recent Labs Lab 08/03/16 1252  WBC 10.0  HGB 12.5  HCT 35.7  PLT 215   ------------------------------------------------------------------------------------------------------------------  Chemistries   Recent Labs Lab 08/03/16 1252  NA 136  K 2.6*  CL 101  CO2 26  GLUCOSE 292*  BUN 11  CREATININE 0.71  CALCIUM 8.3*  MG 1.1*  AST 26  ALT 24  ALKPHOS 118  BILITOT 6.4*   ------------------------------------------------------------------------------------------------------------------  Cardiac Enzymes No results for input(s): TROPONINI in the last 168 hours. ------------------------------------------------------------------------------------------------------------------  RADIOLOGY:  No results found.  EKG:   Orders placed or performed during the hospital encounter of 08/03/16  . ED EKG  . ED EKG    IMPRESSION AND PLAN:   Amber Christensen  is a 51 y.o. female with a known history of Multiple medical problems including chronic pancreatitis secondary to liver disease, status post subtotal pancreatectomy, hypertension, diabetes mellitus since her pancreatic surgery, reflux, bipolar disorder presents to hospital secondary to worsening of lower extremity swelling and also rash.  #1 hypokalemia and hypomagnesemia-being replaced aggressively. Started on oral supplements and follow up in a.m.  #2 acute flare of vasculitis-has biopsy confirmed vasculitis about 3 weeks ago, requiring prednisone taper all the time. -Started on oral prednisone. -We consult dermatology and rheumatology. -Keep the legs elevated.  #3 diabetes mellitus-secondary to pancreatic surgery after chronic pancreatitis. -Follows with  endocrinology, currently on an insulin pump which will continue. Follow-up A1c  #4 hypertension-on Norvasc and lisinopril.  #5 GERD-on Protonix  #6 DVT prophylaxis-on Lovenox  All the records are reviewed and case discussed with ED provider. Management plans discussed with the patient, family and they are in agreement.  CODE STATUS: Full code  TOTAL TIME TAKING CARE OF THIS PATIENT: 55 minutes.    Cordarrel Stiefel M.D on 08/03/2016 at 3:31 PM  Between 7am to 6pm - Pager - [phone]  After 6pm go to [email or web address] - password [email or web address] [facility]  Office  [phone]  CC: Primary care physician; Glendon Axe, MD

## 2016-08-03 NOTE — ED Notes (Signed)
Critical value of potassium 2.6 reported via phone to Dr Alfred Levins. Acknowledged with no new orders.

## 2016-08-03 NOTE — ED Notes (Signed)
Pt given sandwich tray at this time  

## 2016-08-04 LAB — CBC
HCT: 36.5 % (ref 35.0–47.0)
Hemoglobin: 12.7 g/dL (ref 12.0–16.0)
MCH: 30.5 pg (ref 26.0–34.0)
MCHC: 34.7 g/dL (ref 32.0–36.0)
MCV: 88 fL (ref 80.0–100.0)
Platelets: 164 10*3/uL (ref 150–440)
RBC: 4.15 MIL/uL (ref 3.80–5.20)
RDW: 13.6 % (ref 11.5–14.5)
WBC: 6.4 10*3/uL (ref 3.6–11.0)

## 2016-08-04 LAB — COMPREHENSIVE METABOLIC PANEL
ALT: 22 U/L (ref 14–54)
AST: 23 U/L (ref 15–41)
Albumin: 3.8 g/dL (ref 3.5–5.0)
Alkaline Phosphatase: 122 U/L (ref 38–126)
Anion gap: 6 (ref 5–15)
BUN: 8 mg/dL (ref 6–20)
CO2: 28 mmol/L (ref 22–32)
Calcium: 7.5 mg/dL — ABNORMAL LOW (ref 8.9–10.3)
Chloride: 103 mmol/L (ref 101–111)
Creatinine, Ser: 0.58 mg/dL (ref 0.44–1.00)
GFR calc Af Amer: >60 mL/min (ref >60)
GFR calc non Af Amer: >60 mL/min (ref >60)
Glucose, Bld: 279 mg/dL — ABNORMAL HIGH (ref 65–99)
Potassium: 3.7 mmol/L (ref 3.5–5.1)
Sodium: 137 mmol/L (ref 135–145)
Total Bilirubin: 5.3 mg/dL — ABNORMAL HIGH (ref 0.3–1.2)
Total Protein: 6.6 g/dL (ref 6.5–8.1)

## 2016-08-04 LAB — GLUCOSE, CAPILLARY
Glucose-Capillary: 289 mg/dL — ABNORMAL HIGH (ref 65–99)
Glucose-Capillary: 183 mg/dL — ABNORMAL HIGH (ref 65–99)
Glucose-Capillary: 325 mg/dL — ABNORMAL HIGH (ref 65–99)
Glucose-Capillary: 464 mg/dL — ABNORMAL HIGH (ref 65–99)
Glucose-Capillary: 465 mg/dL — ABNORMAL HIGH (ref 65–99)
Glucose-Capillary: 408 mg/dL — ABNORMAL HIGH (ref 65–99)

## 2016-08-04 LAB — MAGNESIUM: Magnesium: 1.7 mg/dL (ref 1.7–2.4)

## 2016-08-04 LAB — HEMOGLOBIN A1C
Hgb A1c MFr Bld: 7.4 % — ABNORMAL HIGH (ref 4.8–5.6)
Mean Plasma Glucose: 166 mg/dL

## 2016-08-04 MED ORDER — INSULIN ASPART 100 UNIT/ML ~~LOC~~ SOLN
1000.0000 [IU] | Freq: Once | SUBCUTANEOUS | Status: AC
  Administered 2016-08-04: 08:00:00 1000 [IU] via INTRAVENOUS
  Filled 2016-08-04: qty 10

## 2016-08-04 MED ORDER — LORATADINE 10 MG PO TABS
10.0000 mg | ORAL_TABLET | Freq: Every day | ORAL | Status: DC
  Administered 2016-08-04 – 2016-08-06 (×3): 10 mg via ORAL
  Filled 2016-08-04 (×3): qty 1

## 2016-08-04 MED ORDER — DIPHENHYDRAMINE HCL 25 MG PO CAPS
25.0000 mg | ORAL_CAPSULE | Freq: Four times a day (QID) | ORAL | Status: DC | PRN
  Administered 2016-08-04: 11:00:00 25 mg via ORAL
  Filled 2016-08-04: qty 1

## 2016-08-04 MED ORDER — INSULIN ASPART 100 UNIT/ML ~~LOC~~ SOLN
5.0000 [IU] | Freq: Once | SUBCUTANEOUS | Status: AC
  Administered 2016-08-04: 22:00:00 5 [IU] via SUBCUTANEOUS
  Filled 2016-08-04: qty 5

## 2016-08-04 MED ORDER — COLCHICINE 0.6 MG PO TABS
0.6000 mg | ORAL_TABLET | Freq: Every day | ORAL | Status: DC
  Administered 2016-08-04 – 2016-08-06 (×3): 0.6 mg via ORAL
  Filled 2016-08-04 (×3): qty 1

## 2016-08-04 NOTE — Consult Note (Signed)
Reason for Consult: Vasculitis  Referring Physician: Shirlean Schlein [place]   HPI: 51 year old white female. History of diabetes. Cirrhosis. Status post partial pancreatectomy for pancreatitis. Has insulin pump In March developed rash with hospitalization. Worse workup for systemic vasculitis was negative. Biopsy showed leukocytoclastic vasculitis. She was treated with prednisone taper. Since then she said she's had several prednisone tapers. Now on insulin pump. She's not been on colchicine or other remittive agents. Usually takes antihistamine which helps. She had recent flare.. Felt like she had some tachycardia and swelling. Presented in the emergency room. Admitted. Low potassium. Recently off her pancreatic supplements because of cost. CBC. labs are normal with normal creatinine. She has been given steroids and Benadryl with improvement. No recent shortness of breath but did have palpitations. No joint swelling.  PMH: Diabetes. Pancreatitis. Cirrhosis. Esophagitis with prior varices. Leukocytoclastic vasculitis.  SURGICAL HISTORY: Pancreatectomy. Appendectomy. Polyp. Hysterectomy.  Family History: Negative for connective tissue disease  Social History: No cigarettes or alcohol  Allergies:  Allergies  Allergen Reactions  . Latex Rash  . Demerol [Meperidine] Other (See Comments)    Pt states that this medication causes cardiac arrest.    . Dilaudid [Hydromorphone Hcl] Other (See Comments)    Pt states that this medication causes cardiac arrest.    . Morphine And Related Other (See Comments)    Pt states that this medication causes cardiac arrest.    . Darvon [Propoxyphene] Nausea And Vomiting and Rash  . Flexeril [Cyclobenzaprine] Nausea And Vomiting and Rash  . Levaquin [Levofloxacin In D5w] Nausea And Vomiting and Rash  . Soma [Carisoprodol] Nausea And Vomiting and Rash  . Zofran [Ondansetron Hcl] Nausea And Vomiting and Rash    Medications:  Scheduled: . amLODipine  10 mg  Oral Daily  . aspirin EC  81 mg Oral Daily  . cholecalciferol  1,000 Units Oral Daily  . docusate sodium  100 mg Oral BID  . enoxaparin (LOVENOX) injection  40 mg Subcutaneous Q24H  . insulin aspart  0-5 Units Subcutaneous QHS  . insulin pump   Subcutaneous TID AC, HS, 0200  . lisinopril  40 mg Oral Daily  . pantoprazole  40 mg Oral BID  . potassium chloride SA  20 mEq Oral BID  . predniSONE  50 mg Oral Q breakfast  . vitamin E  400 Units Oral Daily        ROS:No definite chest pain. No blood per stool. Some loose stools. No fever.   PHYSICAL EXAM: Blood pressure 135/80, pulse 97, temperature 97.9 F (36.6 C), temperature source Oral, resp. rate 20, height 5' 3"  (1.6 m), weight 60.8 kg (134 lb), SpO2 99 %. Pleasant female. No acute distress. Sclera clear. Nonblanching erythematous rash in the mid shins bilaterally. I'll erythema the forearm and abdomen. Oropharynx clear. No oral ulcers. Clear chest. No significant murmur. Nontender abdomen. No significant edema Musculoskeletal: No synovitis  Assessment: Recurrent leukocytoclastic vasculitis. Unknown antigen. Response to steroids but complicated by hyperglycemia Diabetes on insulin pump Chronic pancreatitis status post partial pancreatectomy Cirrhosis  Recommendations: We will look for chronic agents for prophylaxis and treatment besides using prednisone    May well be trial and error.  Would recommend a One-A-Day antihistamine such as Zyrtec or Claritin rather than just using when necessary.   Would recommend a trial of cold she seen 0.6 mg 1 a day if not complicating her diarrhea. I be glad to see her back in 2 weeks. She may taper off the prednisone  KERNODLE  Susann Givens 08/04/2016, 12:38 PM

## 2016-08-04 NOTE — Progress Notes (Signed)
Blood glucose result of 464 at 1642.   Rechecked blood glucose 2 hours after patient ate dinner - result of 465.   On call MD Verdell Carmine) paged and notified. Per previous electronic order, patient is to give correction bolus via insulin pump. MD confirmed.   Patient denied knowing how to perform task herself, informed patient not to do anything at this time and the doctor would be paged.   On call MD paged again and informed of above. Verbal order given to administer one time dose of 12 units Novolog. While speaking to MD, patient called oncoming RN, Loa Socks to notify of self-administration of bolus of 4.3.   Order from MD changed to recheck blood glucose in one hour. Order placed. Oncoming RN made aware of all of the above.   Madlyn Frankel, RN

## 2016-08-04 NOTE — Progress Notes (Addendum)
  Note that CBG up too 325 mg/dL at lunch. ? Need for new site since patient changed this morning. However patient is also on steroids. May consider calling Dr. Gabriel Carina for insulin pump adjustments while on steroids. Encouraged patient to recheck blood sugar in 1 hour to make sure it is responding to insulin.  Also asked her ot have husband bring insulin pump supplies to hospital in case she needs a new site.  Paged Dr. Posey Pronto to discuss.   Thanks, Adah Perl, RN, BC-ADM Inpatient Diabetes Coordinator Pager [phone] (8a-5p)

## 2016-08-04 NOTE — Progress Notes (Signed)
[facility] [place] NAME: Amber Christensen    MR#:  [id number]  DATE OF BIRTH:  November 03, 1965  SUBJECTIVE:   Came in with bilateral LE rash. Had some on chest and abdomen also per pt REVIEW OF SYSTEMS:   Review of Systems  Constitutional: Negative for chills, fever and weight loss.  HENT: Negative for ear discharge, ear pain and nosebleeds.   Eyes: Negative for blurred vision, pain and discharge.  Respiratory: Negative for sputum production, shortness of breath, wheezing and stridor.   Cardiovascular: Negative for chest pain, palpitations, orthopnea and PND.  Gastrointestinal: Negative for abdominal pain, diarrhea, nausea and vomiting.  Genitourinary: Negative for frequency and urgency.  Musculoskeletal: Negative for back pain and joint pain.  Skin: Positive for itching and rash.  Neurological: Negative for sensory change, speech change, focal weakness and weakness.  Psychiatric/Behavioral: Negative for depression and hallucinations. The patient is not nervous/anxious.    Tolerating Diet:yes Tolerating PT: not needed  DRUG ALLERGIES:   Allergies  Allergen Reactions  . Latex Rash  . Demerol [Meperidine] Other (See Comments)    Pt states that this medication causes cardiac arrest.    . Dilaudid [Hydromorphone Hcl] Other (See Comments)    Pt states that this medication causes cardiac arrest.    . Morphine And Related Other (See Comments)    Pt states that this medication causes cardiac arrest.    . Darvon [Propoxyphene] Nausea And Vomiting and Rash  . Flexeril [Cyclobenzaprine] Nausea And Vomiting and Rash  . Levaquin [Levofloxacin In D5w] Nausea And Vomiting and Rash  . Soma [Carisoprodol] Nausea And Vomiting and Rash  . Zofran [Ondansetron Hcl] Nausea And Vomiting and Rash    VITALS:  Blood pressure 135/80, pulse 97, temperature 97.9 F (36.6 C), temperature source Oral, resp. rate 20, height 5' 3"  (1.6 m), weight 60.8 kg  (134 lb), SpO2 99 %.  PHYSICAL EXAMINATION:   Physical Exam  GENERAL:  51 y.o.-year-old patient lying in the bed with no acute distress.  EYES: Pupils equal, round, reactive to light and accommodation. No scleral icterus. Extraocular muscles intact.  HEENT: Head atraumatic, normocephalic. Oropharynx and nasopharynx clear.  NECK:  Supple, no jugular venous distention. No thyroid enlargement, no tenderness.  LUNGS: Normal breath sounds bilaterally, no wheezing, rales, rhonchi. No use of accessory muscles of respiration.  CARDIOVASCULAR: S1, S2 normal. No murmurs, rubs, or gallops.  ABDOMEN: Soft, nontender, nondistended. Bowel sounds present. No organomegaly or mass.  EXTREMITIES: No cyanosis, clubbing or edema b/l.    NEUROLOGIC: Cranial nerves II through XII are intact. No focal Motor or sensory deficits b/l.   PSYCHIATRIC:  patient is alert and oriented x 3.  SKIN:bilateral tibial shin rash  LABORATORY PANEL:  CBC  Recent Labs Lab 08/04/16 0332  WBC 6.4  HGB 12.7  HCT 36.5  PLT 164    Chemistries   Recent Labs Lab 08/04/16 0332  NA 137  K 3.7  CL 103  CO2 28  GLUCOSE 279*  BUN 8  CREATININE 0.58  CALCIUM 7.5*  MG 1.7  AST 23  ALT 22  ALKPHOS 122  BILITOT 5.3*   Cardiac Enzymes No results for input(s): TROPONINI in the last 168 hours. RADIOLOGY:  No results found. ASSESSMENT AND PLAN:   Amber Christensen  is a 51 y.o. female with a known history of Multiple medical problems including chronic pancreatitis secondary to liver disease, status post subtotal pancreatectomy, hypertension, diabetes mellitus since her pancreatic  surgery, reflux, bipolar disorder presents to hospital secondary to worsening of lower extremity swelling and also rash.  #1 hypokalemia and hypomagnesemia-being replaced aggressively. Started on oral supplements and follow up in a.m.  #2 acute flare of vasculitis-has biopsy confirmed vasculitis about 3 weeks ago, requiring prednisone taper all  the time. -Started on oral prednisone taper, colchicine and claritin per Dr Scharlene Gloss recommendation -Keep the legs elevated.  #3 diabetes mellitus-secondary to pancreatic surgery after chronic pancreatitis. -Follows with endocrinology, currently on an insulin pump which will continue. Follow-up A1c is 7.4 -pt will manage to give bolus insulin with increase in sugars due to steroids  #4 hypertension-on Norvasc and lisinopril.  #5 GERD-on Protonix  #6 DVT prophylaxis-on Lovenox Case discussed with Care Management/Social Worker. Management plans discussed with the patient, family and they are in agreement.  CODE STATUS: full  DVT Prophylaxis: lovenox  TOTAL TIME TAKING CARE OF THIS PATIENT: *40* minutes.  >50% time spent on counselling and coordination of care  POSSIBLE D/C IN 1-2 DAYS, DEPENDING ON CLINICAL CONDITION.  Note: This dictation was prepared with Dragon dictation along with smaller phrase technology. Any transcriptional errors that result from this process are unintentional.  Chirstina Haan M.D on 08/04/2016 at 12:54 PM  Between 7am to 6pm - Pager - [phone]  After 6pm go to [email or web address] - password [email or web address] [facility]  Office  [phone]  CC: Primary care physician; Glendon Axe, MD

## 2016-08-04 NOTE — Progress Notes (Addendum)
  Inpatient Diabetes Program Recommendations  AACE/ADA: New Consensus Statement on Inpatient Glycemic Control (2015)  Target Ranges:  Prepandial:   less than 140 mg/dL      Peak postprandial:   less than 180 mg/dL (1-2 hours)      Critically ill patients:  140 - 180 mg/dL   Lab Results  Component Value Date   GLUCAP 289 (H) 08/04/2016   HGBA1C 7.4 (H) 08/03/2016    Review of Glycemic Control:  Results for BLAIRE, PALOMINO (MRN [id number]) as of 08/04/2016 08:16  Ref. Range 08/03/2016 17:47 08/03/2016 20:06 08/03/2016 22:59 08/04/2016 01:14  Glucose-Capillary Latest Ref Range: 65 - 99 mg/dL 259 (H) 466 (H) 245 (H) 289 (H)    Diabetes history: Secondary Diabetes after partial pancreatectomy Outpatient Diabetes medications: Insulin pump/ See's Dr. Gabriel Carina Per Dr. Joycie Peek note on 07/13/16:  Basal rates 12 am 0.45 units/hr  2 pm 0.5 units/hr 24-hr basal = 11.3 units  Bolus settings I:C ratio 1:23 Sensitivty 87 Target 100-110 Max bolus 15 units Current orders for Inpatient glycemic control:  Insulin Pump- with above settings Novolog sensitive tid with meals and HS  Inpatient Diabetes Program Recommendations:   Note that blood sugars increased with steroids.  May consider contacting Dr. Gabriel Carina for possible pump adjustments while patient is on steroids.   Thanks, Adah Perl, RN, BC-ADM Inpatient Diabetes Coordinator Pager [phone] (8a-5p)   Addendum:  Visited with patient.  She explained that she is fairly new to the insulin pump.  She did a fantastic job of checking her blood sugars and entering it into the insulin pump.  She changed site this morning.  Blood sugars increased with steroids however down to 183 mg/dL this morning and at 10am=288 mg/dL.  Patient corrected elevation and ate 15 gram snack which she also covered.  She is under lots of stress stating that she has lost 3 jobs in the past 3 months due to her health.  She is particularly frustrated and concerned with her rash  that seems to come back often.  See's liver specialist at [facility].  She has signed contract and has flowsheet at bedside which I have helped her start.  May need adjustments in insulin pump settings if she continues steroids. Thanks, Adah Perl, RN, MSN, BC-ADM

## 2016-08-05 LAB — URINALYSIS, ROUTINE W REFLEX MICROSCOPIC
Bacteria, UA: NONE SEEN
Bilirubin Urine: NEGATIVE
Glucose, UA: >=500 mg/dL — AB
Hgb urine dipstick: NEGATIVE
Ketones, ur: NEGATIVE mg/dL
Leukocytes, UA: NEGATIVE
Nitrite: NEGATIVE
Protein, ur: NEGATIVE mg/dL
RBC / HPF: NONE SEEN RBC/hpf (ref 0–5)
Specific Gravity, Urine: 1.009 (ref 1.005–1.030)
WBC, UA: NONE SEEN WBC/hpf (ref 0–5)
pH: 6 (ref 5.0–8.0)

## 2016-08-05 LAB — GLUCOSE, CAPILLARY
Glucose-Capillary: 306 mg/dL — ABNORMAL HIGH (ref 65–99)
Glucose-Capillary: 322 mg/dL — ABNORMAL HIGH (ref 65–99)
Glucose-Capillary: 504 mg/dL (ref 65–99)
Glucose-Capillary: 521 mg/dL (ref 65–99)
Glucose-Capillary: 582 mg/dL (ref 65–99)
Glucose-Capillary: 582 mg/dL (ref 65–99)
Glucose-Capillary: >600 mg/dL (ref 65–99)
Glucose-Capillary: 527 mg/dL (ref 65–99)
Glucose-Capillary: 478 mg/dL — ABNORMAL HIGH (ref 65–99)

## 2016-08-05 MED ORDER — PREDNISONE 5 MG PO TABS: 45.0000 mg | ORAL_TABLET | Freq: Every day | ORAL | 0 refills | Status: DC

## 2016-08-05 MED ORDER — INSULIN ASPART 100 UNIT/ML ~~LOC~~ SOLN
15.0000 [IU] | Freq: Once | SUBCUTANEOUS | Status: AC
  Administered 2016-08-05: 16:00:00 15 [IU] via SUBCUTANEOUS

## 2016-08-05 MED ORDER — PREDNISONE 20 MG PO TABS
40.0000 mg | ORAL_TABLET | Freq: Every day | ORAL | Status: DC
  Administered 2016-08-05 – 2016-08-06 (×2): 40 mg via ORAL
  Filled 2016-08-05 (×2): qty 2

## 2016-08-05 MED ORDER — LORATADINE 10 MG PO TABS: 10.0000 mg | ORAL_TABLET | Freq: Every day | ORAL | 0 refills | Status: DC

## 2016-08-05 MED ORDER — COLCHICINE 0.6 MG PO TABS: 0.6000 mg | ORAL_TABLET | Freq: Every day | ORAL | 1 refills | Status: DC

## 2016-08-05 MED ORDER — INSULIN ASPART 100 UNIT/ML ~~LOC~~ SOLN
15.0000 [IU] | Freq: Once | SUBCUTANEOUS | Status: DC
  Filled 2016-08-05: qty 15

## 2016-08-05 NOTE — Discharge Summary (Addendum)
[facility] NAME: Amber Christensen    MR#:  [id number]  DATE OF BIRTH:  09/12/1965  DATE OF ADMISSION:  08/03/2016 ADMITTING PHYSICIAN: Gladstone Lighter, MD  DATE OF DISCHARGE: 08/06/2016  PRIMARY CARE PHYSICIAN: Glendon Axe, MD    ADMISSION DIAGNOSIS:  Vasculitis ([place]) [I77.6]  DISCHARGE DIAGNOSIS:  Recurrent Vasculitis Dm-2 on Insulin pump  SECONDARY DIAGNOSIS:   Past Medical History:  Diagnosis Date  . Diabetes mellitus without complication ([place])   . GERD (gastroesophageal reflux disease)   . Hypertension   . Liver disease   . Pancreatitis     HOSPITAL COURSE:  Amber Christensen a 51 y.o. femalewith a known history of Multiple medical problems including chronic pancreatitis secondary to liver disease, status post subtotal pancreatectomy, hypertension, diabetes mellitus since her pancreatic surgery, reflux, bipolar disorder presents to hospital secondary to worsening of lower extremity swelling and also rash.  #1 hypokalemia and hypomagnesemia-being replaced aggressively.  -on oral supplements -repleted  #2 acute flare of vasculitis-has biopsy confirmed vasculitis about 3 weeks ago, requiring prednisone taper all the time. -Started on oral prednisone taper, colchicine and claritin per Dr Scharlene Gloss recommendation -Keep the legs elevated.  #3 diabetes mellitus-secondary to pancreatic surgery after chronic pancreatitis. -Follows with endocrinology, currently on an insulin pump which will continue. Follow-up A1c is 7.4 -pt will manage to give bolus insulin with increase in sugars due to steroids -pt manages her insulin pump well  #4 hypertension-on Norvasc and lisinopril.  #5 GERD-on Protonix  #6 DVT prophylaxis-on Lovenox  #7 Patient had some trouble swallowing after potassium pill last night.  No swelling or tongue or face. Likely related to potassium than an allergic reaction.  Pt will f/u with  Dr Jefm Bryant in 2 weeks  CONSULTS OBTAINED:  Treatment Team:  Emmaline Kluver., MD Judi Cong, MD  DRUG ALLERGIES:   Allergies  Allergen Reactions  . Latex Rash  . Demerol [Meperidine] Other (See Comments)    Pt states that this medication causes cardiac arrest.    . Dilaudid [Hydromorphone Hcl] Other (See Comments)    Pt states that this medication causes cardiac arrest.    . Morphine And Related Other (See Comments)    Pt states that this medication causes cardiac arrest.    . Darvon [Propoxyphene] Nausea And Vomiting and Rash  . Flexeril [Cyclobenzaprine] Nausea And Vomiting and Rash  . Levaquin [Levofloxacin In D5w] Nausea And Vomiting and Rash  . Soma [Carisoprodol] Nausea And Vomiting and Rash  . Zofran [Ondansetron Hcl] Nausea And Vomiting and Rash    DISCHARGE MEDICATIONS:   Current Discharge Medication List    START taking these medications   Details  colchicine 0.6 MG tablet Take 1 tablet (0.6 mg total) by mouth daily. Qty: 30 tablet, Refills: 0    loratadine (CLARITIN) 10 MG tablet Take 1 tablet (10 mg total) by mouth daily. Qty: 30 tablet, Refills: 0      CONTINUE these medications which have CHANGED   Details  predniSONE (DELTASONE) 5 MG tablet 7 tabs po day1; 6 tabs po day day2; 5 tabs po day3; 4 tabs po day4; 3 tabs po day5; 2 tabs po day6; 1 tab po day7 Qty: 28 tablet, Refills: 0      CONTINUE these medications which have NOT CHANGED   Details  amLODipine (NORVASC) 10 MG tablet Take 10 mg by mouth daily.    aspirin EC 81 MG tablet Take 81 mg by  mouth daily.    CALCIUM-MAGNESIUM-ZINC PO Take 1 tablet by mouth daily.    Cholecalciferol (D3-1000 PO) Take 1,000 Units by mouth daily.    HYDROcodone-acetaminophen (NORCO) 7.5-325 MG tablet Take 1 tablet by mouth every 4 (four) hours as needed for moderate pain.    lisinopril (PRINIVIL,ZESTRIL) 40 MG tablet Take 40 mg by mouth daily.    NOVOLOG 100 UNIT/ML injection 40 Units by Other route  daily. Use up to 40 units daily via insulin pump.    pantoprazole (PROTONIX) 40 MG tablet Take 1 tablet (40 mg total) by mouth 2 (two) times daily. Qty: 60 tablet, Refills: 1    potassium chloride SA (K-DUR,KLOR-CON) 20 MEQ tablet Take 1 tablet (20 mEq total) by mouth 2 (two) times daily. Qty: 14 tablet, Refills: 0    potassium gluconate 595 (99 K) MG TABS tablet Take 595 mg by mouth daily.    promethazine (PHENERGAN) 12.5 MG tablet Take 1 tablet (12.5 mg total) by mouth every 6 (six) hours as needed for nausea or vomiting. Qty: 12 tablet, Refills: 0    vitamin E 400 UNIT capsule Take 400 Units by mouth daily.    insulin lispro (HUMALOG) 100 UNIT/ML injection Inject 0.04 mLs (4 Units total) into the skin 3 (three) times daily with meals. Pt uses based off the amount of carbs she eats at each meal. Qty: 10 mL, Refills: 11      STOP taking these medications     oxyCODONE (ROXICODONE) 5 MG immediate release tablet      oxyCODONE (ROXICODONE) 5 MG immediate release tablet         If you experience worsening of your admission symptoms, develop shortness of breath, life threatening emergency, suicidal or homicidal thoughts you must seek medical attention immediately by calling 911 or calling your MD immediately  if symptoms less severe.  You Must read complete instructions/literature along with all the possible adverse reactions/side effects for all the Medicines you take and that have been prescribed to you. Take any new Medicines after you have completely understood and accept all the possible adverse reactions/side effects.   Please note  You were cared for by a hospitalist during your hospital stay. If you have any questions about your discharge medications or the care you received while you were in the hospital after you are discharged, you can call the unit and asked to speak with the hospitalist on call if the hospitalist that took care of you is not available. Once you are  discharged, your primary care physician will handle any further medical issues. Please note that NO REFILLS for any discharge medications will be authorized once you are discharged, as it is imperative that you return to your primary care physician (or establish a relationship with a primary care physician if you do not have one) for your aftercare needs so that they can reassess your need for medications and monitor your lab values. Today    VITAL SIGNS:  Blood pressure (!) 147/89, pulse 85, temperature 97.8 F (36.6 C), temperature source Oral, resp. rate 14, height 5' 3"  (1.6 m), weight 60.8 kg (134 lb), SpO2 98 %.    PHYSICAL EXAMINATION:  GENERAL:  51 y.o.-year-old patient lying in the bed with no acute distress.  EYES: Pupils equal, round, reactive to light and accommodation. No scleral icterus. Extraocular muscles intact.  HEENT: Head atraumatic, normocephalic. Oropharynx and nasopharynx clear.  NECK:  Supple, no jugular venous distention. No thyroid enlargement, no tenderness.  LUNGS: Normal  breath sounds bilaterally, no wheezing, rales,rhonchi or crepitation. No use of accessory muscles of respiration.  CARDIOVASCULAR: S1, S2 normal. No murmurs, rubs, or gallops.  ABDOMEN: Soft, non-tender, non-distended. Bowel sounds present. No organomegaly or mass.  EXTREMITIES: No pedal edema, cyanosis, or clubbing.  NEUROLOGIC: Cranial nerves II through XII are intact. Muscle strength 5/5 in all extremities. Sensation intact. Gait not checked.  PSYCHIATRIC: The patient is alert and oriented x 3.  SKIN: bilateral LE rash stable  DATA REVIEW:   CBC   Recent Labs Lab 08/04/16 0332  WBC 6.4  HGB 12.7  HCT 36.5  PLT 164    Chemistries   Recent Labs Lab 08/04/16 0332  NA 137  K 3.7  CL 103  CO2 28  GLUCOSE 279*  BUN 8  CREATININE 0.58  CALCIUM 7.5*  MG 1.7  AST 23  ALT 22  ALKPHOS 122  BILITOT 5.3*      Management plans discussed with the patient, family and they are  in agreement.  CODE STATUS:     Code Status Orders        Start     Ordered   08/03/16 1709  Full code  Continuous     08/03/16 1709    Code Status History    Date Active Date Inactive Code Status Order ID Comments User Context   04/20/2016  3:10 PM 04/22/2016  7:35 PM Full Code [id number]  Fritzi Mandes, MD Inpatient   09/28/2015  3:09 PM 09/29/2015  8:25 PM Full Code [id number]  Bettey Costa, MD Inpatient      TOTAL TIME TAKING CARE OF THIS PATIENT: 32 minutes.    Loletha Grayer M.D on 08/06/2016 at 8:51 AM  Between 7am to 6pm - Pager - [phone] After 6pm go to [email or web address] - password [email or web address] [facility]  Office  [phone]  CC: Primary care physician; Glendon Axe, MD

## 2016-08-05 NOTE — Progress Notes (Signed)
Sugars quiet high. Will get Dr Gabriel Carina to see pt

## 2016-08-05 NOTE — Progress Notes (Signed)
Verbal order read back and verified for 15 units Novolog once now related to CBG 521 and recheck blood sugar before potential discharge home.   Patient updated on plan of care.

## 2016-08-05 NOTE — Progress Notes (Signed)
Inpatient Diabetes Program Recommendations  AACE/ADA: New Consensus Statement on Inpatient Glycemic Control (2015)  Target Ranges:  Prepandial:   less than 140 mg/dL      Peak postprandial:   less than 180 mg/dL (1-2 hours)      Critically ill patients:  140 - 180 mg/dL   Lab Results  Component Value Date   GLUCAP 582 (HH) 08/05/2016   HGBA1C 7.4 (H) 08/03/2016    Review of Glycemic ControlResults for Amber Christensen, Amber Christensen (MRN [id number]) as of 08/05/2016 14:40  Ref. Range 08/05/2016 07:50 08/05/2016 11:54 08/05/2016 11:56 08/05/2016 13:23  Glucose-Capillary Latest Ref Range: 65 - 99 mg/dL 322 (H) 504 (HH) 521 (HH) 582 (HH)   Inpatient Diabetes Program Recommendations:    Spoke with patient- she states that she changed site and administered another 5.7 units of insulin.  Of note when she changed the site, the cannula was slightly bent.   She states she does feel dizzy and "feels like her blood sugar is high".  Will follow. Dr. Gabriel Carina consulted as well by MD.  Thanks, Adah Perl, RN, BC-ADM Inpatient Diabetes Coordinator Pager [phone]

## 2016-08-05 NOTE — Progress Notes (Signed)
Inpatient Diabetes Program Recommendations  AACE/ADA: New Consensus Statement on Inpatient Glycemic Control (2015)  Target Ranges:  Prepandial:   less than 140 mg/dL      Peak postprandial:   less than 180 mg/dL (1-2 hours)      Critically ill patients:  140 - 180 mg/dL   Lab Results  Component Value Date   JPVGKK 159 (HH) 08/05/2016   HGBA1C 7.4 (H) 08/03/2016    Review of Glycemic Control:  Results for ISLAM, VILLESCAS (MRN [id number]) as of 08/05/2016 12:47  Ref. Range 08/05/2016 01:26 08/05/2016 07:50 08/05/2016 11:54 08/05/2016 11:56  Glucose-Capillary Latest Ref Range: 65 - 99 mg/dL 306 (H) 322 (H) 504 (HH) 521 (HH)   Diabetes history: Secondary Diabetes after partial pancreatectomy Outpatient Diabetes medications: Insulin pump/ See's Dr. Gabriel Carina Per Dr. Joycie Peek note on 07/13/16:  Basal rates 12 am 0.45 units/hr  2 pm 0.5 units/hr 24-hr basal = 11.3 units  Bolus settings I:C ratio 1:23 Sensitivty 87 Target 100-110 Max bolus 15 units Current orders for Inpatient glycemic control:  Insulin Pump- with above settings Novolog sensitive tid with meals and HS Inpatient Diabetes Program Recommendations:    Note blood sugar up to 521 mg/dL.  Per documentation, patient bolused 6.7 units of insulin.  Note that patient now has order for Novolog 15 units x1.  Paged Dr. Posey Pronto to discuss.  ? Called Dr. Joycie Peek office for possible insulin pump setting changes.  Also consider rechecking blood sugar 1 hour after bolus to see if blood sugar comes down prior to giving additional insulin.  Patient is very sensitive to insulin and according to insulin pump settings, one unit drops her approximately 87 mg/dL.  Patient likely more resistant to insulin due to steroids.    Thanks, Adah Perl, RN, BC-ADM Inpatient Diabetes Coordinator Pager [phone] (8a-5p)

## 2016-08-05 NOTE — Progress Notes (Signed)
Dr. Posey Pronto notified patient's current blood sugar is 582 (No change after second 5.7 insulin bolus via insulin pump through new site at 1430)  Verbal order read back and verified for 15 units novolog to be given once now to decrease blood sugar.

## 2016-08-05 NOTE — Progress Notes (Addendum)
Inpatient Diabetes Program Recommendations  AACE/ADA: New Consensus Statement on Inpatient Glycemic Control (2015)  Target Ranges:  Prepandial:   less than 140 mg/dL      Peak postprandial:   less than 180 mg/dL (1-2 hours)      Critically ill patients:  140 - 180 mg/dL   Lab Results  Component Value Date   GLUCAP 582 (HH) 08/05/2016   HGBA1C 7.4 (H) 08/03/2016  Results for DAJIAH, KOOI (MRN [id number]) as of 08/05/2016 14:04  Ref. Range 08/05/2016 11:54 08/05/2016 11:56 08/05/2016 13:23  Glucose-Capillary Latest Ref Range: 65 - 99 mg/dL 504 (HH) 521 (HH) 582 (HH)    Note blood sugars increased one hour after patient took bolus via insulin pump.  Blood sugar remains high. Text page sent to MD and recommended Novolog 7 units x1 and patient needs to change insulin pump site. Also may consider BMP?  Instructed RN to please have patient change insulin pump site.  Endocrinology consult pending.   Thanks, Adah Perl, RN, BC-ADM Inpatient Diabetes Coordinator Pager [phone] (8a-5p)

## 2016-08-05 NOTE — Consult Note (Signed)
ENDOCRINOLOGY CONSULTATION  REFERRING PHYSICIAN:  Fritzi Mandes, MD CONSULTING PHYSICIAN:  A. Lavone Orn, MD.  CHIEF COMPLAINT:  Diabetes mellitus  HISTORY OF PRESENT ILLNESS:  51 y.o. female is seen in consultation for diabetes management. Patient with secondary diabetes due to prior partial pancreatectomy, managed with insulin pump. Patient was admitted for flare of lower leg vasculitis and today was started on Prednisone 40 mg. Plan is for her to take Prednisone 45 mg starting tomorrow and titrate down by 5 mg daily. Yesterday sugars were in the 306-322 range and today after dose of Prednisone, sugars have been repeatedly >500. Amber Christensen feels poorly, feels hot and sweaty and nauseated. She has bolused appropriately with her insulin pump and at 4 PM was given a SQ injection of NovoLog 15 units.   Her insulin pump is set at: Basal rates 12 am 0.45 units/hr  2 pm 0.5 units/hr                             24-hr basal = 11.3 units  Bolus settings I:C ratio 1:23 Sensitivty 87 Target 100-110 Max bolus 15 units    PAST MEDICAL HISTORY: . Bipolar 1 disorder  . Bladder mass  . Breast mass  . Carpal tunnel syndrome  . Cervical cancer  . Chronic pancreatitis   History of chronic pancreatitis that began in her late 45s, s/p multiple ERCPs with a complication of severe pancreatitis in   2014 which led to the subtotal pancreatectomy  . 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 Depression  . Esophagitis 09/25/13, [facility] GRADE C  . Hypertension  . Irritable bowel syndrome  . Kidney mass  . Migraine  . NASH (nonalcoholic steatohepatitis)  . Necrotizing pancreatitis  . Pancreatitis  . Secondary diabetes after subtotal pancreatectomy    CURRENT MEDICATIONS:  . amLODipine  10 mg Oral Daily  . aspirin EC  81 mg Oral Daily  . cholecalciferol  1,000 Units Oral Daily  . colchicine  0.6 mg Oral Daily  . docusate sodium  100 mg Oral BID  . enoxaparin (LOVENOX) injection  40 mg Subcutaneous Q24H  . insulin pump    Subcutaneous TID AC, HS, 0200  . lisinopril  40 mg Oral Daily  . loratadine  10 mg Oral Daily  . pantoprazole  40 mg Oral BID  . potassium chloride SA  20 mEq Oral BID  . predniSONE  40 mg Oral Q breakfast  . vitamin E  400 Units Oral Daily     SOCIAL HISTORY:  Social History  Substance Use Topics  . Smoking status: Former Smoker    Types: Cigarettes  . Smokeless tobacco: Former Systems developer  . Alcohol use No     FAMILY HISTORY:   Family History  Problem Relation Age of Onset  . Cirrhosis Mother   . Colon cancer Mother      ALLERGIES:  Allergies  Allergen Reactions  . Latex Rash  . Demerol [Meperidine] Other (See Comments)    Pt states that this medication causes cardiac arrest.    . Dilaudid [Hydromorphone Hcl] Other (See Comments)    Pt states that this medication causes cardiac arrest.    . Morphine And Related Other (See Comments)    Pt states that this medication causes cardiac arrest.    . Darvon [Propoxyphene] Nausea And Vomiting and Rash  . Flexeril [Cyclobenzaprine] Nausea And Vomiting and Rash  . Levaquin [Levofloxacin In D5w] Nausea And Vomiting and Rash  .  Soma [Carisoprodol] Nausea And Vomiting and Rash  . Zofran [Ondansetron Hcl] Nausea And Vomiting and Rash    REVIEW OF SYSTEMS:  GENERAL:  No weight loss.  No fever.  HEENT:  No blurred vision. No sore throat.  NECK:  No neck pain or dysphagia.  CARDIAC:  No chest pain or palpitation.  PULMONARY:  No cough or shortness of breath.  ABDOMEN:  No abdominal pain.  No constipation. EXTREMITIES:  No lower extremity swelling.  ENDOCRINE:  No heat or cold intolerance.  GENITOURINARY:  No dysuria or hematuria. SKIN:  No recent rash or skin changes.   PHYSICAL EXAMINATION:  BP (!) 161/96 (BP Location: Left Arm)   Pulse 93   Temp 98 F (36.7 C)   Resp 18   Ht 5' 3"  (1.6 m)   Wt 60.8 kg (134 lb)   SpO2 97%   BMI 23.74 kg/m   GENERAL:  Well-developed female in NAD. Appears uncomfortable.  HEENT:  EOMI.   Oropharynx is clear.  NECK:  Supple.  No thyromegaly.  No neck tenderness.  CARDIAC:  Regular rate and rhythm without murmur.  PULMONARY:  Clear to auscultation bilaterally.  ABDOMEN:  Diffusely soft, nontender, nondistended. Pump site is C/D. EXTREMITIES:  No peripheral edema is present.    SKIN:  No rash or dermatopathy. NEUROLOGIC:  No dysarthria.  No tremor. PSYCHIATRIC:  Alert and oriented, calm, cooperative.   LABORATORY DATA:  Results for orders placed or performed during the hospital encounter of 08/03/16 (from the past 24 hour(s))  Glucose, capillary     Status: Abnormal   Collection Time: 08/04/16  7:40 PM  Result Value Ref Range   Glucose-Capillary 465 (H) 65 - 99 mg/dL  Glucose, capillary     Status: Abnormal   Collection Time: 08/04/16  9:13 PM  Result Value Ref Range   Glucose-Capillary 408 (H) 65 - 99 mg/dL  Glucose, capillary     Status: Abnormal   Collection Time: 08/05/16  1:26 AM  Result Value Ref Range   Glucose-Capillary 306 (H) 65 - 99 mg/dL  Glucose, capillary     Status: Abnormal   Collection Time: 08/05/16  7:50 AM  Result Value Ref Range   Glucose-Capillary 322 (H) 65 - 99 mg/dL  Urinalysis, Routine w reflex microscopic     Status: Abnormal   Collection Time: 08/05/16 10:46 AM  Result Value Ref Range   Color, Urine STRAW (A) YELLOW   APPearance CLEAR (A) CLEAR   Specific Gravity, Urine 1.009 1.005 - 1.030   pH 6.0 5.0 - 8.0   Glucose, UA >=500 (A) NEGATIVE mg/dL   Hgb urine dipstick NEGATIVE NEGATIVE   Bilirubin Urine NEGATIVE NEGATIVE   Ketones, ur NEGATIVE NEGATIVE mg/dL   Protein, ur NEGATIVE NEGATIVE mg/dL   Nitrite NEGATIVE NEGATIVE   Leukocytes, UA NEGATIVE NEGATIVE   RBC / HPF NONE SEEN 0 - 5 RBC/hpf   WBC, UA NONE SEEN 0 - 5 WBC/hpf   Bacteria, UA NONE SEEN NONE SEEN   Squamous Epithelial / LPF 0-5 (A) NONE SEEN   Mucous PRESENT   Glucose, capillary     Status: Abnormal   Collection Time: 08/05/16 11:54 AM  Result Value Ref Range    Glucose-Capillary 504 (HH) 65 - 99 mg/dL  Glucose, capillary     Status: Abnormal   Collection Time: 08/05/16 11:56 AM  Result Value Ref Range   Glucose-Capillary 521 (HH) 65 - 99 mg/dL  Glucose, capillary     Status: Abnormal  Collection Time: 08/05/16  1:23 PM  Result Value Ref Range   Glucose-Capillary 582 (HH) 65 - 99 mg/dL  Glucose, capillary     Status: Abnormal   Collection Time: 08/05/16  3:34 PM  Result Value Ref Range   Glucose-Capillary >600 (HH) 65 - 99 mg/dL  Glucose, capillary     Status: Abnormal   Collection Time: 08/05/16  3:37 PM  Result Value Ref Range   Glucose-Capillary 582 (HH) 65 - 99 mg/dL     ASSESSMENT:  1. Secondary diabetes mellitus, uncontrolled 2.   Hyperglycemia due to steroids 3.   Titration of insulin pump  PLAN: * Counseled patient about effects of steroids on blood sugars. * Advised her to continue to bolus up to q4 hrs to effect. Monitor sugars closely. Watch for stacking of insulin, leading to hypoglycemia. Keep juice and sugars at bedside as needed.  * Will have her do a 24-hr temp basal daily set at 24 hrs of 200%. She can do this until her dose of Prednisone is 15 mg, at which point she can reduce it to 150% and return to baseline (100%) once Prednisone is stopped.   Thank you for the kind request for consultation.

## 2016-08-05 NOTE — Progress Notes (Signed)
Discharge paperwork reviewed with patient who verbalized understanding. New medications and medication changes explained- prescriptions sent electronically to [facility]. Blood sugars elevated due to currently taking steroids. Patient educated to closely monitor blood sugar during this time. Follow up appointments made and reviewed with patient. Patient aware and verbalizes understanding of discharge instructions. Patient is stable and ready for discharge. Patient to transport home herself.

## 2016-08-05 NOTE — Progress Notes (Addendum)
Blood Sugar 504. Recheck 521 Insulin Novolog 6.7 bolus via insulin pump. Dr. Posey Pronto notified.

## 2016-08-05 NOTE — Progress Notes (Addendum)
Blood sugar remains=582 mg/dL.  Text paged MD with recommendations.  RN states she received orders for Novolog 15 units x 1 from Dr. Posey Pronto.  Reminded her to tell patient not to bolus at the same time.   RN Still awaiting call back from Dr. Gabriel Carina as well.   Thanks, Adah Perl, RN, BC-ADM Inpatient Diabetes Coordinator Pager [phone] (8a-5p)

## 2016-08-05 NOTE — Plan of Care (Signed)
Problem: Altered GI Function ([place]-1.4) Goal: Nutrition education Formal process to instruct or train a patient/client in a skill or to impart knowledge to help patients/clients voluntarily manage or modify food choices and eating behavior to maintain or improve health. Outcome: Completed/Met Date Met: 08/05/16  RD consulted for nutrition education regarding diabetes.   Lab Results  Component Value Date   HGBA1C 7.4 (H) 08/03/2016   RD provided "Pancreatitis Nutrition Therapy" handout from the Academy of [employer] and [employer]etics. Encouraged low-fat diet with adequate calories and protein. Discussed foods that were higher in fat to limit. Encouraged patient to have a protein source at each meal.  RD provided "Carbohydrate Counting for People with Diabetes" handout from the Academy of Nutrition and Dietetics. Discussed different food groups and their effects on blood sugar, emphasizing carbohydrate-containing foods. Provided list of carbohydrates and recommended serving sizes of common foods.  RD provided "Using Nutrition Labels: Carbohydrates" handout from the Academy of [employer] and [employer]etics. Discussed how to read a nutrition label including looking at serving size and servings per container. Reviewed that there are 15 grams of carbohydrate in one carbohydrate choice. Encouraged patient to use chart for range of carbohydrate grams per choice when reading nutrition labels.  Discussed importance of controlled and consistent carbohydrate intake throughout the day. Provided examples of ways to balance meals/snacks and encouraged intake of high-fiber, whole grain complex carbohydrates. Teach back method used.  Expect good compliance.  Body mass index is 23.74 kg/m. Pt meets criteria for normal weight based on current BMI.  Current diet order is Heart Healthy/Carbohydrate Modified, patient is consuming approximately 100% of meals at this time. Labs and medications reviewed.   Willey Blade, MS,  RD, LDN Pager: [phone] After Hours Pager: [phone]

## 2016-08-05 NOTE — Progress Notes (Signed)
Patient c/o pain with urination. Dr. Posey Pronto notified- urinalysis ordered. Urine sent to lab: clear/yellow, no odor. Awaiting results before discharge.

## 2016-08-05 NOTE — Progress Notes (Addendum)
Blood sugar (11:56am): 521 6.7 novolog bolus (12pm) via insulin pump **Inpatient Diabetes Coordinator, Adah Perl, contacted this RN and Dr. Posey Pronto advising not to give ordered one time dose 15 units novolog due to current basal insulin programmed in patient's pump. 15 units novolog NOT GIVEN. Endocrinology Consuledt- Dr. Gabriel Carina to come see patient today.  Blood sugar (1323): 582  Contacted Jenny, Inpatient Diabetes Coordinator, for recommendation after continued elevated blood sugar. Sonia Baller to contact Dr. Posey Pronto for further action. For now, recommends patient needs to change insulin pump site.   Patient updated on plan of care.

## 2016-08-05 NOTE — Progress Notes (Signed)
Spoke with Dr. Valda Favia office to inquire about consult.  Explained that CBG had increased after initial bolus on insulin pump.  Site has been changed.  To recheck blood sugar 1 hour after 2nd bolus.  She states she will get message to Dr. Gabriel Carina.  Thanks, Adah Perl, RN, BC-ADM Inpatient Diabetes Coordinator Pager [phone]

## 2016-08-05 NOTE — Progress Notes (Addendum)
Patient changed insulin pump site. Patient did another bolus of 5.7 units of insulin. Will re-check blood sugar in 1 hour.

## 2016-08-05 NOTE — Progress Notes (Signed)
Initial Nutrition Assessment  DOCUMENTATION CODES:   Not applicable  INTERVENTION:  Patient would benefit from pancreatic enzymes in setting of reports of steatorrhea up to 10 times per day.   Also recommend providing water-soluble forms of fat-soluble vitamins (vitamins A, D, E, K) as patient will have limited absorption of these vitamins in setting of likely fat malabsorption.  Reviewed "Pancreatitis Nutrition Therapy." As patient is not currently on pancreatic enzymes recommended low-fat diet that is high in calories and protein to help manage steatorrhea while maintaining weight/lean body mass.  Reviewed "Carbohydrate Counting for Patients with Diabetes" and "Using Nutrition Labels: Carbohydrates." Patient is very proficient in counting carbohydrates already.   NUTRITION DIAGNOSIS:   Altered GI function related to  (hx subtotal pancreatectomy, chronic pancreatitis, not on Creon) as evidenced by per patient/family report (report of steatorrhea).  GOAL:   Patient will meet greater than or equal to 90% of their needs  MONITOR:   PO intake, Labs, Weight trends, I & O's  REASON FOR ASSESSMENT:   Consult Diet education  ASSESSMENT:   51 year old female with PMHx of HTN, GERD, liver disease, chronic pancreatitis s/p subtotal pancreatectomy, DM since pancreatectomy on insulin pump, who presented with worsening lower extremity swelling and rash found to have hypokalemia and hypomagnesemia s/p repletion, acute flare of vasculitis on prednisone taper.   -Per chart plan is for pt to d/c home today.  Spoke with patient at bedside. She reports she had her pancreatectomy approximately 6 years ago in [place]. Reports approximately 80% of her pancreas was removed. She was on Creon (believes it was about 30000 units TID with meals), but is not longer on Creon because insurance is now covering it. Patient reports she has at least 10 occurrences per day of loose, foul-smelling bowel movements  that float. She would like to go back on Creon. She reports she got her insulin pump about 1.5 months ago. 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 Reports she just occasionally forgets to enter when she eats snacks during the day and before bed, and will later have hyperglycemia. She reports she had been waiting on a liver transplant for cirrhosis, but now she is off the list because they cannot actually find the scans that showed the cirrhosis. Patient reports now she is having lower abdominal pain and is concerned about how her eyes appear jaundiced. Taking vitamin D, vitamin E, magnesium, calcium, zinc.   UBW 122 lbs recently. Gained up to 137 lbs over 2 weeks with steroids. Patient reports prior to her pancreatectomy she had been 150 lbs and lost down to 90 lbs fairly quickly after procedure. She stayed in the hospital for 6 months and required nutrition support. Had since gained that weight back with regular meals and good intake of protein.  Meal Completion: 100%  Medications reviewed and include: Vitamin D 1000 units daily, Colace, pantoprazole, potassium chloride 20 mEq BID, prednisone 40 mg daily, vitamin E 400 units daily.  Labs reviewed: CBG 306-465 past 24 hrs. Hgb A1c 7.4 on 08/03/2016.  Nutrition-Focused physical exam completed. Findings are mild-moderate fat depletion in upper arm region, no muscle depletion, and mild edema. Eyes appear jaundiced.  Patient does not meet criteria for malnutrition at this time. She is likely experiencing malabsorption of fat and would benefit from being back on Creon or being on a water-soluble form of the fat-soluble vitamins (A, D, E, K).  Discussed with RN.  Diet Order:  Diet heart healthy/carb modified Room service appropriate? Yes; Fluid consistency: Thin Diet Carb Modified  Skin:  Reviewed, no issues  Last BM:  08/03/2016  Height:   Ht Readings from Last 1 Encounters:  08/03/16 5'  3" (1.6 m)    Weight:   Wt Readings from Last 1 Encounters:  08/03/16 134 lb (60.8 kg)    Ideal Body Weight:  52.3 kg  BMI:  Body mass index is 23.74 kg/m.  Estimated Nutritional Needs:   Kcal:  1825-2130 (30-35 kcal/kg)  Protein:  90-105 grams (1.5-1.7 grams/kg)  Fluid:  1.8-2.1 L/day (30-35 ml/kg)  EDUCATION NEEDS:   Education needs addressed  Willey Blade, MS, RD, LDN Pager: [phone] After Hours Pager: [phone]

## 2016-08-06 LAB — GLUCOSE, CAPILLARY
Glucose-Capillary: 461 mg/dL — ABNORMAL HIGH (ref 65–99)
Glucose-Capillary: 410 mg/dL — ABNORMAL HIGH (ref 65–99)
Glucose-Capillary: 486 mg/dL — ABNORMAL HIGH (ref 65–99)

## 2016-08-06 MED ORDER — LORATADINE 10 MG PO TABS: 10.0000 mg | ORAL_TABLET | Freq: Every day | ORAL | 0 refills | Status: DC

## 2016-08-06 MED ORDER — PREDNISONE 5 MG PO TABS: ORAL_TABLET | ORAL | 0 refills | Status: DC

## 2016-08-06 MED ORDER — COLCHICINE 0.6 MG PO TABS: 0.6000 mg | ORAL_TABLET | Freq: Every day | ORAL | 0 refills | Status: DC

## 2016-08-06 NOTE — Progress Notes (Signed)
Dr. Leslye Peer notified CBG 486, patient received 6 units via insulin pump. OK to proceed with discharge at this time since Dr. Gabriel Carina saw patient yesterday and modified basal amount and how to control blood sugar while currently on prednisone. Follow-up appointment made with Dr. Gabriel Carina and Dr. Jefm Bryant. Patient updated on plan of care.

## 2016-08-06 NOTE — Progress Notes (Addendum)
Patient ID: Amber Christensen, female   DOB: Mar 05, 1965, 51 y.o.   MRN: [id number] [place] [facility] was admitted to the Hospital on 08/03/2016 and Discharged  08/06/2016 and should be excused from work/school   for 7  days starting 08/03/2016 , may return to work/school 08/11/2016 without any restrictions.  Loletha Grayer M.D on 08/06/2016,at 2:33 PM  [place] [facility] [place]

## 2016-08-06 NOTE — Progress Notes (Signed)
Discharge paperwork reviewed with patient who verbalized understanding. New medications and medication changes explained- prescriptions sent electronically to [facility]. Blood sugars elevated due to currently taking steroids. Patient educated to closely monitor blood sugar during this time and follow Dr. Joycie Peek instructions for modification. Follow up appointments made and reviewed with patient. Patient aware and verbalizes understanding of discharge instructions. Patient is stable and ready for discharge. Patient to transport home herself. Belongings taken with patient. Patient driving home- escorted out ED entrance by staff.

## 2016-08-06 NOTE — Plan of Care (Signed)
Problem: Education: Goal: Knowledge of [facility] Education information/materials will improve Outcome: Progressing VSS, free of falls during shift.  Reported abdominal pain 4/10, not requiring intervention.  Reported throat pain 6i/10, received PRN PO Norco 7.5-325mg x1, PRN PO Xanax 0.63m x1.  No other complaints overnight.  Bed in low position, call bell within reach.  WCTM.

## 2016-08-06 NOTE — Progress Notes (Signed)
Patient ID: Amber Christensen, female   DOB: [birth year], 51 y.o.   MRN: [id number]  [facility] PROGRESS NOTE  Amber Christensen OMV:[id number] DOB: Oct 19, 1965 DOA: 08/03/2016 PCP: Glendon Axe, MD  HPI/Subjective: Patient had some trouble with swallowing since last night with taking potassium pills. Sugar still up but trending a little bit better.  Dr Gabriel Carina discussed with her on how to control the insulin pump while on prednisone.  Objective: Vitals:   08/05/16 1923 08/06/16 0304  BP: (!) 139/91 (!) 147/89  Pulse: 99 85  Resp: 14 14  Temp: 97.5 F (36.4 C) 97.8 F (36.6 C)    Filed Weights   08/03/16 1240  Weight: 60.8 kg (134 lb)    ROS: Review of Systems  Constitutional: Negative for chills and fever.  Eyes: Negative for blurred vision.  Respiratory: Negative for cough and shortness of breath.   Cardiovascular: Negative for chest pain.  Gastrointestinal: Negative for abdominal pain, constipation, diarrhea, nausea and vomiting.  Genitourinary: Negative for dysuria.  Musculoskeletal: Negative for joint pain.  Neurological: Negative for dizziness and headaches.   Exam: Physical Exam  HENT:  Nose: No mucosal edema.  Mouth/Throat: No oropharyngeal exudate or posterior oropharyngeal edema.  Eyes: Conjunctivae, EOM and lids are normal. Pupils are equal, round, and reactive to light.  Neck: No JVD present. Carotid bruit is not present. No edema present. No thyroid mass and no thyromegaly present.  Cardiovascular: S1 normal and S2 normal.  Exam reveals no gallop.   No murmur heard. Pulses:      Dorsalis pedis pulses are 2+ on the right side, and 2+ on the left side.  Respiratory: No respiratory distress. She has no wheezes. She has no rhonchi. She has no rales.  GI: Soft. Bowel sounds are normal. There is no tenderness.  Musculoskeletal:       Right shoulder: She exhibits no swelling.  Lymphadenopathy:    She has no cervical adenopathy.  Neurological: She is alert. No  cranial nerve deficit.  Skin: Skin is warm. Nails show no clubbing.  Fading rash on extremities  Psychiatric: She has a normal mood and affect.      Data Reviewed: Basic Metabolic Panel:  Recent Labs Lab 08/03/16 1252 08/04/16 0332  NA 136 137  K 2.6* 3.7  CL 101 103  CO2 26 28  GLUCOSE 292* 279*  BUN 11 8  CREATININE 0.71 0.58  CALCIUM 8.3* 7.5*  MG 1.1* 1.7   Liver Function Tests:  Recent Labs Lab 08/03/16 1252 08/04/16 0332  AST 26 23  ALT 24 22  ALKPHOS 118 122  BILITOT 6.4* 5.3*  PROT 7.1 6.6  ALBUMIN 4.4 3.8   CBC:  Recent Labs Lab 08/03/16 1252 08/04/16 0332  WBC 10.0 6.4  HGB 12.5 12.7  HCT 35.7 36.5  MCV 87.9 88.0  PLT 215 164    CBG:  Recent Labs Lab 08/05/16 1537 08/05/16 1704 08/05/16 2011 08/06/16 0252 08/06/16 0728  GLUCAP 582* 527* 478* 461* 410*       Scheduled Meds: . amLODipine  10 mg Oral Daily  . aspirin EC  81 mg Oral Daily  . cholecalciferol  1,000 Units Oral Daily  . colchicine  0.6 mg Oral Daily  . docusate sodium  100 mg Oral BID  . enoxaparin (LOVENOX) injection  40 mg Subcutaneous Q24H  . insulin pump   Subcutaneous TID AC, HS, 0200  . lisinopril  40 mg Oral Daily  . loratadine  10 mg Oral Daily  .  pantoprazole  40 mg Oral BID  . potassium chloride SA  20 mEq Oral BID  . predniSONE  40 mg Oral Q breakfast  . vitamin E  400 Units Oral Daily    Assessment/Plan:   1. leukoclastic vasculitis- prednisone taper, cholcicine, claritin 2. Hypokalemia- replaced 3. Trouble swallowing- unlikely allergic reaction, likely secondary to potassium pill 4. Diabetes on insulin pump. Dr Gabriel Carina advised her on how to compensate while on prednisone  Code Status:     Code Status Orders        Start     Ordered   08/03/16 1709  Full code  Continuous     08/03/16 1709    Code Status History    Date Active Date Inactive Code Status Order ID Comments User Context   04/20/2016  3:10 PM 04/22/2016  7:35 PM Full Code  [id number]  Fritzi Mandes, MD Inpatient   09/28/2015  3:09 PM 09/29/2015  8:25 PM Full Code [id number]  Bettey Costa, MD Inpatient      Disposition Plan: home today  Time spent: 32 minutes  [place]

## 2016-08-06 NOTE — Progress Notes (Signed)
Inpatient Diabetes Program Recommendations  AACE/ADA: New Consensus Statement on Inpatient Glycemic Control (2015)  Target Ranges:  Prepandial:   less than 140 mg/dL      Peak postprandial:   less than 180 mg/dL (1-2 hours)      Critically ill patients:  140 - 180 mg/dL   Lab Results  Component Value Date   GLUCAP 410 (H) 08/06/2016   HGBA1C 7.4 (H) 08/03/2016    Review of Glycemic Control:  Results for INELL, MIMBS (MRN [id number]) as of 08/06/2016 08:32  Ref. Range 08/05/2016 15:37 08/05/2016 17:04 08/05/2016 20:11 08/06/2016 02:52 08/06/2016 07:28  Glucose-Capillary Latest Ref Range: 65 - 99 mg/dL 582 (HH) 527 (HH) 478 (H) 461 (H) 410 (H)   Note that patient seen by Dr. Gabriel Carina yesterday evening and insulin pump settings changed.  Inpatient Diabetes Program Recommendations:    Consider BMP to assess for acidosis?  Blood sugars are still greater than 400 mg/dL.  If blood sugars increase further today, ? Need for insulin drip while steroids being tapered?  Thanks, Adah Perl, RN, BC-ADM Inpatient Diabetes Coordinator Pager [phone] (8a-5p)

## 2016-08-08 ENCOUNTER — Encounter: Payer: Self-pay | Admitting: *Deleted

## 2016-08-08 DIAGNOSIS — Z79899 Other long term (current) drug therapy: Secondary | ICD-10-CM | POA: Insufficient documentation

## 2016-08-08 DIAGNOSIS — Z91040 Latex allergy status: Secondary | ICD-10-CM | POA: Diagnosis not present

## 2016-08-08 DIAGNOSIS — E119 Type 2 diabetes mellitus without complications: Secondary | ICD-10-CM | POA: Diagnosis not present

## 2016-08-08 DIAGNOSIS — Z87891 Personal history of nicotine dependence: Secondary | ICD-10-CM | POA: Diagnosis not present

## 2016-08-08 DIAGNOSIS — Z885 Allergy status to narcotic agent status: Secondary | ICD-10-CM | POA: Insufficient documentation

## 2016-08-08 DIAGNOSIS — I10 Essential (primary) hypertension: Secondary | ICD-10-CM | POA: Diagnosis not present

## 2016-08-08 DIAGNOSIS — R252 Cramp and spasm: Secondary | ICD-10-CM | POA: Insufficient documentation

## 2016-08-08 DIAGNOSIS — Z7984 Long term (current) use of oral hypoglycemic drugs: Secondary | ICD-10-CM | POA: Diagnosis not present

## 2016-08-08 LAB — CBC
HCT: 41.8 % (ref 35.0–47.0)
Hemoglobin: 14.3 g/dL (ref 12.0–16.0)
MCH: 29.8 pg (ref 26.0–34.0)
MCHC: 34.1 g/dL (ref 32.0–36.0)
MCV: 87.2 fL (ref 80.0–100.0)
Platelets: 290 10*3/uL (ref 150–440)
RBC: 4.79 MIL/uL (ref 3.80–5.20)
RDW: 13.4 % (ref 11.5–14.5)
WBC: 14.1 10*3/uL — ABNORMAL HIGH (ref 3.6–11.0)

## 2016-08-08 LAB — COMPREHENSIVE METABOLIC PANEL
ALT: 34 U/L (ref 14–54)
AST: 30 U/L (ref 15–41)
Albumin: 4.7 g/dL (ref 3.5–5.0)
Alkaline Phosphatase: 123 U/L (ref 38–126)
Anion gap: 13 (ref 5–15)
BUN: 31 mg/dL — ABNORMAL HIGH (ref 6–20)
CO2: 24 mmol/L (ref 22–32)
Calcium: 9.3 mg/dL (ref 8.9–10.3)
Chloride: 100 mmol/L — ABNORMAL LOW (ref 101–111)
Creatinine, Ser: 0.97 mg/dL (ref 0.44–1.00)
GFR calc Af Amer: >60 mL/min (ref >60)
GFR calc non Af Amer: >60 mL/min (ref >60)
Glucose, Bld: 190 mg/dL — ABNORMAL HIGH (ref 65–99)
Potassium: 4 mmol/L (ref 3.5–5.1)
Sodium: 137 mmol/L (ref 135–145)
Total Bilirubin: 5.5 mg/dL — ABNORMAL HIGH (ref 0.3–1.2)
Total Protein: 7.9 g/dL (ref 6.5–8.1)

## 2016-08-08 LAB — GLUCOSE, CAPILLARY: Glucose-Capillary: 186 mg/dL — ABNORMAL HIGH (ref 65–99)

## 2016-08-08 NOTE — ED Triage Notes (Signed)
Blood sugar 186 in triage, pt gave herself a 40 unit bolus prior to coming to ED

## 2016-08-08 NOTE — ED Triage Notes (Signed)
Pt  Discharged Saturday from inpatient with hyperglycemia, pt complains of cramping of the hands and legs, pt reports being discharged of a blood sugar of 698, pt has an insulin pump

## 2016-08-09 ENCOUNTER — Emergency Department
Admission: EM | Admit: 2016-08-09 | Discharge: 2016-08-09 | Disposition: A | Discharge status: Home / Self Care | Payer: Medicare Other | Attending: Emergency Medicine | Admitting: Emergency Medicine

## 2016-08-09 DIAGNOSIS — R252 Cramp and spasm: Secondary | ICD-10-CM | POA: Diagnosis not present

## 2016-08-09 LAB — GLUCOSE, CAPILLARY: Glucose-Capillary: 86 mg/dL (ref 65–99)

## 2016-08-09 NOTE — ED Provider Notes (Signed)
[facility] Emergency Department Provider Note  ____________________________________________  Time seen: Approximately 12:54 AM  I have reviewed the triage vital signs and the nursing notes.   HISTORY  Chief Complaint Leg Pain   HPI Amber Christensen is a 51 y.o. female with h/o NASH cirrhosis, vasculitis on prednisone, DM, HTN who presents for evaluation of muscle cramps. Patient reports having muscle cramps when her potassium was very low in the past which made her worried. This evening she had cramps in both her feet, her legs, and her hands. She reports severe pain when the cramps were going on which was constant and nonradiating. She checked her sugar which was elevated into the 600s. Patient gave herself 40U of novolog prior to arrival. She reports that over the course of the last 2 days her BG has been persistently elevated. She discussed that with her primary care doctor who told her that this is a side effect of the prednisone that she is taking for the vasculitis. Her rash from the vasculitis is now resolved and patient feels markedly improved from that sense. At this time patient feels back to her baseline. She is no longer having cramps.  Past Medical History:  Diagnosis Date  . Diabetes mellitus without complication ([place])   . GERD (gastroesophageal reflux disease)   . Hypertension   . Liver disease   . Pancreatitis     Patient Active Problem List   Diagnosis Date Noted  . Vasculitis ([place]) 08/03/2016  . Acute maculopapular rash 04/21/2016  . Maculopapular rash, generalized 04/20/2016  . Unstable angina ([place]) 09/28/2015    Past Surgical History:  Procedure Laterality Date  . ABDOMINAL HYSTERECTOMY    . APPENDECTOMY    . BLADDER SURGERY    . BOWEL RESECTION    . BREAST BIOPSY Left 1989  . CHOLECYSTECTOMY    . CYST REMOVAL HAND Left 1992   Ganglion cyst removed from Left Wrist  . DILATATION & CURETTAGE/HYSTEROSCOPY WITH MYOSURE    . KNEE  ARTHROSCOPY Left 1992  . PANCREAS SURGERY    . REPAIR OF ESOPHAGUS  2012   3 clamps placed in esophagus    Prior to Admission medications   Medication Sig Start Date End Date Taking? Authorizing Provider  amLODipine (NORVASC) 10 MG tablet Take 10 mg by mouth daily. 06/23/16   [provider]  aspirin EC 81 MG tablet Take 81 mg by mouth daily.    [provider]  CALCIUM-MAGNESIUM-ZINC PO Take 1 tablet by mouth daily.    [provider]  Cholecalciferol (D3-1000 PO) Take 1,000 Units by mouth daily.    [provider]  colchicine 0.6 MG tablet Take 1 tablet (0.6 mg total) by mouth daily. 08/06/16   Loletha Grayer, MD  HYDROcodone-acetaminophen (NORCO) 7.5-325 MG tablet Take 1 tablet by mouth every 4 (four) hours as needed for moderate pain.    [provider]  insulin lispro (HUMALOG) 100 UNIT/ML injection Inject 0.04 mLs (4 Units total) into the skin 3 (three) times daily with meals. Pt uses based off the amount of carbs she eats at each meal. Patient not taking: Reported on 08/03/2016 09/29/15   Henreitta Leber, MD  lisinopril (PRINIVIL,ZESTRIL) 40 MG tablet Take 40 mg by mouth daily. 05/25/16   [provider]  loratadine (CLARITIN) 10 MG tablet Take 1 tablet (10 mg total) by mouth daily. 08/06/16   Loletha Grayer, MD  NOVOLOG 100 UNIT/ML injection 40 Units by Other route daily. Use up  to 40 units daily via insulin pump. 07/24/16   [provider]  pantoprazole (PROTONIX) 40 MG tablet Take 1 tablet (40 mg total) by mouth 2 (two) times daily. 09/29/15   Henreitta Leber, MD  potassium chloride SA (K-DUR,KLOR-CON) 20 MEQ tablet Take 1 tablet (20 mEq total) by mouth 2 (two) times daily. 04/19/16   Harvest Dark, MD  potassium gluconate 595 (99 K) MG TABS tablet Take 595 mg by mouth daily.    [provider]  predniSONE (DELTASONE) 5 MG tablet 7 tabs po day1; 6 tabs po day day2; 5 tabs po day3; 4 tabs po day4; 3 tabs po day5;  2 tabs po day6; 1 tab po day7 08/06/16   Loletha Grayer, MD  promethazine (PHENERGAN) 12.5 MG tablet Take 1 tablet (12.5 mg total) by mouth every 6 (six) hours as needed for nausea or vomiting. 04/06/16   Merlyn Lot, MD  vitamin E 400 UNIT capsule Take 400 Units by mouth daily.    [provider]    Allergies Latex; Demerol [meperidine]; Dilaudid [hydromorphone hcl]; Morphine and related; Darvon [propoxyphene]; Flexeril [cyclobenzaprine]; Levaquin [levofloxacin in d5w]; Soma [carisoprodol]; and Zofran [ondansetron hcl]  Family History  Problem Relation Age of Onset  . Cirrhosis Mother   . Colon cancer Mother     Social History Social History  Substance Use Topics  . Smoking status: Former Smoker    Types: Cigarettes  . Smokeless tobacco: Former Systems developer  . Alcohol use No    Review of Systems  Constitutional: Negative for fever. Eyes: Negative for visual changes. ENT: Negative for sore throat. Neck: No neck pain  Cardiovascular: Negative for chest pain. Respiratory: Negative for shortness of breath. Gastrointestinal: Negative for abdominal pain, vomiting or diarrhea. Genitourinary: Negative for dysuria. Musculoskeletal: Negative for back pain. + diffuse muscle cramps Skin: Negative for rash. Neurological: Negative for headaches, weakness or numbness. Psych: No SI or HI  ____________________________________________   PHYSICAL EXAM:  VITAL SIGNS: ED Triage Vitals [08/08/16 2143]  Enc Vitals Group     BP (!) 143/100     Pulse Rate (!) 124     Resp 20     Temp 97.9 F (36.6 C)     Temp Source Oral     SpO2 98 %     Weight 134 lb (60.8 kg)     Height 5' 3"  (1.6 m)     Head Circumference      Peak Flow      Pain Score 10     Pain Loc      Pain Edu?      Excl. in [place]?     Constitutional: Alert and oriented. Well appearing and in no apparent distress. HEENT:      Head: Normocephalic and atraumatic.         Eyes: Conjunctivae are normal. Sclera is  non-icteric.       Mouth/Throat: Mucous membranes are moist.       Neck: Supple with no signs of meningismus. Cardiovascular: Regular rate and rhythm. No murmurs, gallops, or rubs. 2+ symmetrical distal pulses are present in all extremities. No JVD. Respiratory: Normal respiratory effort. Lungs are clear to auscultation bilaterally. No wheezes, crackles, or rhonchi.  Gastrointestinal: Soft, non tender, and non distended with positive bowel sounds. No rebound or guarding. Musculoskeletal: Nontender with normal range of motion in all extremities. No edema, cyanosis, or erythema of extremities. Neurologic: Normal speech and language. Face is symmetric. Moving all extremities. No gross focal neurologic  deficits are appreciated. Skin: Skin is warm, dry and intact. No rash noted. Psychiatric: Mood and affect are normal. Speech and behavior are normal.  ____________________________________________   LABS (all labs ordered are listed, but only abnormal results are displayed)  Labs Reviewed  COMPREHENSIVE METABOLIC PANEL - Abnormal; Notable for the following:       Result Value   Chloride 100 (*)    Glucose, Bld 190 (*)    BUN 31 (*)    Total Bilirubin 5.5 (*)    All other components within normal limits  CBC - Abnormal; Notable for the following:    WBC 14.1 (*)    All other components within normal limits  GLUCOSE, CAPILLARY - Abnormal; Notable for the following:    Glucose-Capillary 186 (*)    All other components within normal limits  GLUCOSE, CAPILLARY   ____________________________________________  EKG  none  ____________________________________________  RADIOLOGY  none  ____________________________________________   PROCEDURES  Procedure(s) performed: None Procedures Critical Care performed:  None ____________________________________________   INITIAL IMPRESSION / ASSESSMENT AND PLAN / ED COURSE   51 y.o. female with h/o NASH cirrhosis, vasculitis on prednisone,  DM, HTN who presents for evaluation of muscle cramps in the setting of elevated blood glucose. Patient gave herself 40 units of insulin. She is now back to her baseline. Her vitals are within normal limits. Her blood glucose is 86. Potassium is normal at 4.0. Her white count is elevated at 14.1 which is consistent with her being on prednisone currently. Patient be discharged home at this time with close follow-up with her primary care doctor. Recommend close monitoring of her sugars while she is on prednisone. Patient has an insulin pump. She will follow-up with her PCP on Monday.     Pertinent labs & imaging results that were available during my care of the patient were reviewed by me and considered in my medical decision making (see chart for details).    ____________________________________________   FINAL CLINICAL IMPRESSION(S) / ED DIAGNOSES  Final diagnoses:  Muscle cramps      NEW MEDICATIONS STARTED DURING THIS VISIT:  New Prescriptions   No medications on file     Note:  This document was prepared using Dragon voice recognition software and may include unintentional dictation errors.    Alfred Levins, [place], MD 08/09/16 [id number]

## 2016-09-01 ENCOUNTER — Emergency Department: Payer: Medicare Other

## 2016-09-01 ENCOUNTER — Other Ambulatory Visit: Payer: Self-pay

## 2016-09-01 ENCOUNTER — Emergency Department
Admission: EM | Admit: 2016-09-01 | Discharge: 2016-09-01 | Disposition: A | Discharge status: Home / Self Care | Payer: Medicare Other | Attending: Emergency Medicine | Admitting: Emergency Medicine

## 2016-09-01 DIAGNOSIS — Z79899 Other long term (current) drug therapy: Secondary | ICD-10-CM | POA: Insufficient documentation

## 2016-09-01 DIAGNOSIS — E119 Type 2 diabetes mellitus without complications: Secondary | ICD-10-CM | POA: Diagnosis not present

## 2016-09-01 DIAGNOSIS — I10 Essential (primary) hypertension: Secondary | ICD-10-CM | POA: Insufficient documentation

## 2016-09-01 DIAGNOSIS — Z7982 Long term (current) use of aspirin: Secondary | ICD-10-CM | POA: Insufficient documentation

## 2016-09-01 DIAGNOSIS — I959 Hypotension, unspecified: Secondary | ICD-10-CM | POA: Insufficient documentation

## 2016-09-01 DIAGNOSIS — Z794 Long term (current) use of insulin: Secondary | ICD-10-CM | POA: Diagnosis not present

## 2016-09-01 DIAGNOSIS — Z87891 Personal history of nicotine dependence: Secondary | ICD-10-CM | POA: Insufficient documentation

## 2016-09-01 DIAGNOSIS — R031 Nonspecific low blood-pressure reading: Secondary | ICD-10-CM | POA: Insufficient documentation

## 2016-09-01 LAB — CBC
HCT: 39.6 % (ref 35.0–47.0)
Hemoglobin: 13.7 g/dL (ref 12.0–16.0)
MCH: 30.8 pg (ref 26.0–34.0)
MCHC: 34.5 g/dL (ref 32.0–36.0)
MCV: 89.4 fL (ref 80.0–100.0)
Platelets: 202 10*3/uL (ref 150–440)
RBC: 4.43 MIL/uL (ref 3.80–5.20)
RDW: 13.9 % (ref 11.5–14.5)
WBC: 6.8 10*3/uL (ref 3.6–11.0)

## 2016-09-01 LAB — BASIC METABOLIC PANEL
Anion gap: 8 (ref 5–15)
BUN: 19 mg/dL (ref 6–20)
CO2: 29 mmol/L (ref 22–32)
Calcium: 8.8 mg/dL — ABNORMAL LOW (ref 8.9–10.3)
Chloride: 103 mmol/L (ref 101–111)
Creatinine, Ser: 0.71 mg/dL (ref 0.44–1.00)
GFR calc Af Amer: >60 mL/min (ref >60)
GFR calc non Af Amer: >60 mL/min (ref >60)
Glucose, Bld: 109 mg/dL — ABNORMAL HIGH (ref 65–99)
Potassium: 4 mmol/L (ref 3.5–5.1)
Sodium: 140 mmol/L (ref 135–145)

## 2016-09-01 LAB — TROPONIN I: Troponin I: <0.03 ng/mL (ref <0.03)

## 2016-09-01 NOTE — ED Triage Notes (Signed)
Pt reports went to MD today for a vascular follow up and BP was in the 80's so she was told to come to the ED. Pt reports some pain in neck and left arm. Denies SOB, NVD or other sx's.

## 2016-09-01 NOTE — ED Notes (Signed)
Patient transported to X-ray 

## 2016-09-01 NOTE — ED Notes (Signed)
Pt able to stand and ambulate without assistance and denies feeling dizziness or lightheadedness. Pt in NAD at time of discharge and verbalized understanding of monitoring and follow up care.

## 2016-09-01 NOTE — ED Notes (Signed)
Lungs clear.

## 2016-09-01 NOTE — Discharge Instructions (Signed)
Your blood pressures were normal in the ED. Your lab work was reassuring as well. If you experience any dizziness or lightheadedness please return to the ED

## 2016-09-01 NOTE — ED Notes (Signed)
Says has been having hypotension.  Today started  havng left shoulder pain..  To elbow and left fingers tingling.  In  nad.

## 2016-09-01 NOTE — ED Provider Notes (Signed)
[facility] Emergency Department Provider Note   ____________________________________________    I have reviewed the triage vital signs and the nursing notes.   HISTORY  Chief Complaint Hypotension     HPI Amber Christensen is a 51 y.o. female who presents with reported low blood pressure at her doctor's office today. Patient reports she had a regularly scheduled appointment with her rheumatologist whom she sees for vasculitis. She feels well and has no dizziness or chest pain. However at the office it was noted that she had a low blood pressure, she reports this was noted by her cardiologist yesterday as well and he stopped her blood pressure medications. Again she has no physical complaints and feels quite well   Past Medical History:  Diagnosis Date  . Diabetes mellitus without complication ([place])   . GERD (gastroesophageal reflux disease)   . Hypertension   . Liver disease   . Pancreatitis     Patient Active Problem List   Diagnosis Date Noted  . Vasculitis ([place]) 08/03/2016  . Acute maculopapular rash 04/21/2016  . Maculopapular rash, generalized 04/20/2016  . Unstable angina ([place]) 09/28/2015    Past Surgical History:  Procedure Laterality Date  . ABDOMINAL HYSTERECTOMY    . APPENDECTOMY    . BLADDER SURGERY    . BOWEL RESECTION    . BREAST BIOPSY Left 1989  . CHOLECYSTECTOMY    . CYST REMOVAL HAND Left 1992   Ganglion cyst removed from Left Wrist  . DILATATION & CURETTAGE/HYSTEROSCOPY WITH MYOSURE    . KNEE ARTHROSCOPY Left 1992  . PANCREAS SURGERY    . REPAIR OF ESOPHAGUS  2012   3 clamps placed in esophagus    Prior to Admission medications   Medication Sig Start Date End Date Taking? Authorizing Provider  amLODipine (NORVASC) 10 MG tablet Take 10 mg by mouth daily. 06/23/16   [provider]  aspirin EC 81 MG tablet Take 81 mg by mouth daily.    [provider]  CALCIUM-MAGNESIUM-ZINC PO Take 1 tablet by mouth  daily.    [provider]  Cholecalciferol (D3-1000 PO) Take 1,000 Units by mouth daily.    [provider]  colchicine 0.6 MG tablet Take 1 tablet (0.6 mg total) by mouth daily. 08/06/16   Loletha Grayer, MD  HYDROcodone-acetaminophen (NORCO) 7.5-325 MG tablet Take 1 tablet by mouth every 4 (four) hours as needed for moderate pain.    [provider]  insulin lispro (HUMALOG) 100 UNIT/ML injection Inject 0.04 mLs (4 Units total) into the skin 3 (three) times daily with meals. Pt uses based off the amount of carbs she eats at each meal. Patient not taking: Reported on 08/03/2016 09/29/15   Henreitta Leber, MD  lisinopril (PRINIVIL,ZESTRIL) 40 MG tablet Take 40 mg by mouth daily. 05/25/16   [provider]  loratadine (CLARITIN) 10 MG tablet Take 1 tablet (10 mg total) by mouth daily. 08/06/16   Loletha Grayer, MD  NOVOLOG 100 UNIT/ML injection 40 Units by Other route daily. Use up to 40 units daily via insulin pump. 07/24/16   [provider]  pantoprazole (PROTONIX) 40 MG tablet Take 1 tablet (40 mg total) by mouth 2 (two) times daily. 09/29/15   Henreitta Leber, MD  potassium chloride SA (K-DUR,KLOR-CON) 20 MEQ tablet Take 1 tablet (20 mEq total) by mouth 2 (two) times daily. 04/19/16   Harvest Dark, MD  potassium gluconate 595 (99 K) MG TABS tablet Take 595 mg by  mouth daily.    [provider]  predniSONE (DELTASONE) 5 MG tablet 7 tabs po day1; 6 tabs po day day2; 5 tabs po day3; 4 tabs po day4; 3 tabs po day5; 2 tabs po day6; 1 tab po day7 08/06/16   Loletha Grayer, MD  promethazine (PHENERGAN) 12.5 MG tablet Take 1 tablet (12.5 mg total) by mouth every 6 (six) hours as needed for nausea or vomiting. 04/06/16   Merlyn Lot, MD  vitamin E 400 UNIT capsule Take 400 Units by mouth daily.    [provider]     Allergies Latex; Demerol [meperidine]; Dilaudid [hydromorphone hcl]; Morphine and related; Darvon [propoxyphene];  Flexeril [cyclobenzaprine]; Levaquin [levofloxacin in d5w]; Soma [carisoprodol]; and Zofran [ondansetron hcl]  Family History  Problem Relation Age of Onset  . Cirrhosis Mother   . Colon cancer Mother     Social History Social History  Substance Use Topics  . Smoking status: Former Smoker    Types: Cigarettes  . Smokeless tobacco: Former Systems developer  . Alcohol use No    Review of Systems  Constitutional: No fever/chills Eyes: No visual changes.  ENT: No Neck pain Cardiovascular: Denies chest pain. Respiratory: Denies shortness of breath. Gastrointestinal: No abdominal pain.  No nausea, no vomiting.   Genitourinary: Negative for dysuria. Musculoskeletal: Negative for back pain. Skin: Negative for rash. Neurological: Negative for headaches or weakness   ____________________________________________   PHYSICAL EXAM:  VITAL SIGNS: ED Triage Vitals  Enc Vitals Group     BP 09/01/16 0948 116/72     Pulse Rate 09/01/16 0948 75     Resp 09/01/16 0948 20     Temp 09/01/16 0948 98.4 F (36.9 C)     Temp Source 09/01/16 0948 Oral     SpO2 09/01/16 0948 98 %     Weight 09/01/16 0949 60.3 kg (133 lb)     Height 09/01/16 0949 1.6 m (5' 3" )     Head Circumference --      Peak Flow --      Pain Score 09/01/16 0948 5     Pain Loc --      Pain Edu? --      Excl. in [place]? --     Constitutional: Alert and oriented. No acute distress. Pleasant and interactive Eyes: Conjunctivae are normal.   Nose: No congestion/rhinnorhea. Mouth/Throat: Mucous membranes are moist.    Cardiovascular: Normal rate, regular rhythm. Grossly normal heart sounds.  Good peripheral circulation. Respiratory: Normal respiratory effort.  No retractions. Lungs CTAB. Marland Kitchen  Musculoskeletal: No lower extremity tenderness nor edema.  Warm and well perfused Neurologic:  Normal speech and language. No gross focal neurologic deficits are appreciated.  Skin:  Skin is warm, dry and intact. No rash noted. Psychiatric:  Mood and affect are normal. Speech and behavior are normal.  ____________________________________________   LABS (all labs ordered are listed, but only abnormal results are displayed)  Labs Reviewed  BASIC METABOLIC PANEL - Abnormal; Notable for the following:       Result Value   Glucose, Bld 109 (*)    Calcium 8.8 (*)    All other components within normal limits  CBC  TROPONIN I   ____________________________________________  EKG  ED ECG REPORT I, Lavonia Drafts, the attending physician, personally viewed and interpreted this ECG.  Date: 09/01/2016  Rate: 70 Rhythm: normal sinus rhythm QRS Axis: normal Intervals: normal ST/T Wave abnormalities: normal Narrative Interpretation: unremarkable  ____________________________________________  RADIOLOGY  Chest x-ray unremarkable ____________________________________________  PROCEDURES  Procedure(s) performed: No    Critical Care performed:No ____________________________________________   INITIAL IMPRESSION / ASSESSMENT AND PLAN / ED COURSE  Pertinent labs & imaging results that were available during my care of the patient were reviewed by me and considered in my medical decision making (see chart for details).  Patient well-appearing and in no acute distress. Her blood pressure here is normal. Labs chest x-ray and EKG are all reassuring. Normal orthostatics. Asymptomatic. Recommend outpatient follow-up, return precautions discussed    ____________________________________________   FINAL CLINICAL IMPRESSION(S) / ED DIAGNOSES  Final diagnoses:  Hypotension, unspecified hypotension type      NEW MEDICATIONS STARTED DURING THIS VISIT:  New Prescriptions   No medications on file     Note:  This document was prepared using Dragon voice recognition software and may include unintentional dictation errors.    Lavonia Drafts, MD 09/01/16 1228

## 2016-10-21 ENCOUNTER — Emergency Department
Admission: EM | Admit: 2016-10-21 | Discharge: 2016-10-21 | Discharge status: Against Medical Advice | Payer: Medicare Other

## 2016-10-21 NOTE — ED Notes (Signed)
Pt was given a note saying that she was here but was never seen by a MD.pt was brought over from [facility] for abd pain. Pt did not want to stay to be seen.

## 2016-10-23 ENCOUNTER — Emergency Department: Payer: Medicare Other

## 2016-10-23 ENCOUNTER — Emergency Department
Admission: EM | Admit: 2016-10-23 | Discharge: 2016-10-23 | Disposition: A | Discharge status: Home / Self Care | Payer: Medicare Other | Attending: Emergency Medicine | Admitting: Emergency Medicine

## 2016-10-23 DIAGNOSIS — R110 Nausea: Secondary | ICD-10-CM | POA: Diagnosis not present

## 2016-10-23 DIAGNOSIS — Z91040 Latex allergy status: Secondary | ICD-10-CM | POA: Diagnosis not present

## 2016-10-23 DIAGNOSIS — K529 Noninfective gastroenteritis and colitis, unspecified: Secondary | ICD-10-CM

## 2016-10-23 DIAGNOSIS — Z79899 Other long term (current) drug therapy: Secondary | ICD-10-CM | POA: Diagnosis not present

## 2016-10-23 DIAGNOSIS — I10 Essential (primary) hypertension: Secondary | ICD-10-CM | POA: Diagnosis not present

## 2016-10-23 DIAGNOSIS — Z794 Long term (current) use of insulin: Secondary | ICD-10-CM | POA: Insufficient documentation

## 2016-10-23 DIAGNOSIS — Z87891 Personal history of nicotine dependence: Secondary | ICD-10-CM | POA: Diagnosis not present

## 2016-10-23 DIAGNOSIS — K769 Liver disease, unspecified: Secondary | ICD-10-CM | POA: Diagnosis not present

## 2016-10-23 DIAGNOSIS — R1010 Upper abdominal pain, unspecified: Secondary | ICD-10-CM | POA: Diagnosis present

## 2016-10-23 DIAGNOSIS — E119 Type 2 diabetes mellitus without complications: Secondary | ICD-10-CM | POA: Diagnosis not present

## 2016-10-23 LAB — COMPREHENSIVE METABOLIC PANEL
ALT: 36 U/L (ref 14–54)
AST: 34 U/L (ref 15–41)
Albumin: 3.9 g/dL (ref 3.5–5.0)
Alkaline Phosphatase: 121 U/L (ref 38–126)
Anion gap: 11 (ref 5–15)
BUN: 15 mg/dL (ref 6–20)
CO2: 25 mmol/L (ref 22–32)
Calcium: 8.6 mg/dL — ABNORMAL LOW (ref 8.9–10.3)
Chloride: 102 mmol/L (ref 101–111)
Creatinine, Ser: 0.65 mg/dL (ref 0.44–1.00)
GFR calc Af Amer: >60 mL/min (ref >60)
GFR calc non Af Amer: >60 mL/min (ref >60)
Glucose, Bld: 236 mg/dL — ABNORMAL HIGH (ref 65–99)
Potassium: 3.7 mmol/L (ref 3.5–5.1)
Sodium: 138 mmol/L (ref 135–145)
Total Bilirubin: 4.9 mg/dL — ABNORMAL HIGH (ref 0.3–1.2)
Total Protein: 6.7 g/dL (ref 6.5–8.1)

## 2016-10-23 LAB — LIPASE, BLOOD: Lipase: 16 U/L (ref 11–51)

## 2016-10-23 LAB — CBC
HCT: 39.9 % (ref 35.0–47.0)
Hemoglobin: 14 g/dL (ref 12.0–16.0)
MCH: 31.3 pg (ref 26.0–34.0)
MCHC: 35 g/dL (ref 32.0–36.0)
MCV: 89.3 fL (ref 80.0–100.0)
Platelets: 186 10*3/uL (ref 150–440)
RBC: 4.46 MIL/uL (ref 3.80–5.20)
RDW: 13.1 % (ref 11.5–14.5)
WBC: 6 10*3/uL (ref 3.6–11.0)

## 2016-10-23 LAB — URINALYSIS, COMPLETE (UACMP) WITH MICROSCOPIC
Bacteria, UA: NONE SEEN
Bilirubin Urine: NEGATIVE
Glucose, UA: NEGATIVE mg/dL
Hgb urine dipstick: NEGATIVE
Ketones, ur: NEGATIVE mg/dL
Leukocytes, UA: NEGATIVE
Nitrite: NEGATIVE
Protein, ur: NEGATIVE mg/dL
RBC / HPF: NONE SEEN RBC/hpf (ref 0–5)
Specific Gravity, Urine: 1.018 (ref 1.005–1.030)
Squamous Epithelial / LPF: NONE SEEN
WBC, UA: NONE SEEN WBC/hpf (ref 0–5)
pH: 5 (ref 5.0–8.0)

## 2016-10-23 LAB — PROTIME-INR
INR: 1.1
Prothrombin Time: 14.1 seconds (ref 11.4–15.2)

## 2016-10-23 MED ORDER — IOPAMIDOL (ISOVUE-300) INJECTION 61%
100.0000 mL | Freq: Once | INTRAVENOUS | Status: AC | PRN
  Administered 2016-10-23: 100 mL via INTRAVENOUS

## 2016-10-23 MED ORDER — IOPAMIDOL (ISOVUE-300) INJECTION 61%
30.0000 mL | Freq: Once | INTRAVENOUS | Status: AC | PRN
Start: 2016-10-23 — End: 2016-10-23
  Administered 2016-10-23: 30 mL via ORAL

## 2016-10-23 MED ORDER — METRONIDAZOLE 500 MG PO TABS
500.0000 mg | ORAL_TABLET | Freq: Three times a day (TID) | ORAL | 0 refills | Status: DC
Start: 2016-10-23 — End: 2017-03-28

## 2016-10-23 MED ORDER — HYDROCODONE-ACETAMINOPHEN 5-325 MG PO TABS
1.0000 | ORAL_TABLET | ORAL | Status: AC
  Administered 2016-10-23: 1 via ORAL
  Filled 2016-10-23: qty 1

## 2016-10-23 MED ORDER — GI COCKTAIL ~~LOC~~
30.0000 mL | Freq: Once | ORAL | Status: AC
  Administered 2016-10-23: 30 mL via ORAL
  Filled 2016-10-23: qty 30

## 2016-10-23 MED ORDER — CIPROFLOXACIN HCL 500 MG PO TABS: 500.0000 mg | ORAL_TABLET | Freq: Two times a day (BID) | ORAL | 0 refills | Status: DC

## 2016-10-23 MED ORDER — OXYCODONE HCL 5 MG PO TABS: 5.0000 mg | ORAL_TABLET | Freq: Four times a day (QID) | ORAL | 0 refills | Status: DC | PRN

## 2016-10-23 NOTE — ED Notes (Signed)
Pt ambulated to the restroom without assistance, will try to get stool sample if she is able to.

## 2016-10-23 NOTE — ED Notes (Signed)
Call light answered. Pt said didn't need anything.

## 2016-10-23 NOTE — Discharge Instructions (Signed)
If you're unable to see her primary care doctor you may return to the emergency room or go to the [place] walk-in clinic in 1 or 2 days for reexam.  Please return to the emergency room right away if you are to develop a fever, severe nausea, your pain becomes severe or worsens, you are unable to keep food down, begin vomiting any dark or bloody fluid, you develop any dark or bloody stools, feel dehydrated, or other new concerns or symptoms arise.

## 2016-10-23 NOTE — ED Notes (Signed)
Pt transported to CT ?

## 2016-10-23 NOTE — ED Notes (Signed)
E-sign pad is not working at this time. Pt verbalized understanding of d/c instructions and of follow up care and does not have any questions at this time.

## 2016-10-23 NOTE — ED Notes (Signed)
Pt returned from CT °

## 2016-10-23 NOTE — ED Provider Notes (Signed)
[facility] Emergency Department Provider Note   ____________________________________________   First MD Initiated Contact with Patient 10/23/16 1040     (approximate)  I have reviewed the triage vital signs and the nursing notes.   HISTORY  Chief Complaint Abdominal Pain    HPI Amber Christensen is a 51 y.o. female here for evaluation of abdominal pain.She reports a history of liver disease, previous evaluation by liver transplant team. She reports that she's been having pain intermittently in the upper abdomen since Friday. Skin associated with occasional loose stool and nausea but no vomiting. Reports she did have a small amount of blood in her stool yesterday, and several loose bowel movements but has not seen blood in his stool today.  No fever or chills, reports a crampy pain across the upper abdomen associated with loose stool at times. Part history of pancreatitis, previous pancreatic injury during an ERCP. She reports previous hysterectomy, appendectomy and cholecystectomy   Past Medical History:  Diagnosis Date  . Diabetes mellitus without complication ([place])   . GERD (gastroesophageal reflux disease)   . Hypertension   . Liver disease   . Pancreatitis     Patient Active Problem List   Diagnosis Date Noted  . Vasculitis ([place]) 08/03/2016  . Acute maculopapular rash 04/21/2016  . Maculopapular rash, generalized 04/20/2016  . Unstable angina ([place]) 09/28/2015    Past Surgical History:  Procedure Laterality Date  . ABDOMINAL HYSTERECTOMY    . APPENDECTOMY    . BLADDER SURGERY    . BOWEL RESECTION    . BREAST BIOPSY Left 1989  . CHOLECYSTECTOMY    . CYST REMOVAL HAND Left 1992   Ganglion cyst removed from Left Wrist  . DILATATION & CURETTAGE/HYSTEROSCOPY WITH MYOSURE    . KNEE ARTHROSCOPY Left 1992  . PANCREAS SURGERY    . REPAIR OF ESOPHAGUS  2012   3 clamps placed in esophagus    Prior to Admission medications   Medication Sig  Start Date End Date Taking? Authorizing Provider  amLODipine (NORVASC) 10 MG tablet Take 10 mg by mouth daily. 06/23/16   [provider]  aspirin EC 81 MG tablet Take 81 mg by mouth daily.    [provider]  CALCIUM-MAGNESIUM-ZINC PO Take 1 tablet by mouth daily.    [provider]  Cholecalciferol (D3-1000 PO) Take 1,000 Units by mouth daily.    [provider]  ciprofloxacin (CIPRO) 500 MG tablet Take 1 tablet (500 mg total) by mouth 2 (two) times daily. 10/23/16   Delman Kitten, MD  colchicine 0.6 MG tablet Take 1 tablet (0.6 mg total) by mouth daily. 08/06/16   Loletha Grayer, MD  HYDROcodone-acetaminophen (NORCO) 7.5-325 MG tablet Take 1 tablet by mouth every 4 (four) hours as needed for moderate pain.    [provider]  insulin lispro (HUMALOG) 100 UNIT/ML injection Inject 0.04 mLs (4 Units total) into the skin 3 (three) times daily with meals. Pt uses based off the amount of carbs she eats at each meal. Patient not taking: Reported on 08/03/2016 09/29/15   Henreitta Leber, MD  lisinopril (PRINIVIL,ZESTRIL) 40 MG tablet Take 40 mg by mouth daily. 05/25/16   [provider]  loratadine (CLARITIN) 10 MG tablet Take 1 tablet (10 mg total) by mouth daily. 08/06/16   Loletha Grayer, MD  metroNIDAZOLE (FLAGYL) 500 MG tablet Take 1 tablet (500 mg total) by mouth 3 (three) times daily. 10/23/16   Delman Kitten, MD  NOVOLOG 100 UNIT/ML  injection 40 Units by Other route daily. Use up to 40 units daily via insulin pump. 07/24/16   [provider]  pantoprazole (PROTONIX) 40 MG tablet Take 1 tablet (40 mg total) by mouth 2 (two) times daily. 09/29/15   Henreitta Leber, MD  potassium chloride SA (K-DUR,KLOR-CON) 20 MEQ tablet Take 1 tablet (20 mEq total) by mouth 2 (two) times daily. 04/19/16   Harvest Dark, MD  potassium gluconate 595 (99 K) MG TABS tablet Take 595 mg by mouth daily.    [provider]  predniSONE (DELTASONE) 5 MG tablet  7 tabs po day1; 6 tabs po day day2; 5 tabs po day3; 4 tabs po day4; 3 tabs po day5; 2 tabs po day6; 1 tab po day7 08/06/16   Loletha Grayer, MD  promethazine (PHENERGAN) 12.5 MG tablet Take 1 tablet (12.5 mg total) by mouth every 6 (six) hours as needed for nausea or vomiting. 04/06/16   Merlyn Lot, MD  vitamin E 400 UNIT capsule Take 400 Units by mouth daily.    [provider]    Allergies Latex; Demerol [meperidine]; Dilaudid [hydromorphone hcl]; Morphine and related; Darvon [propoxyphene]; Flexeril [cyclobenzaprine]; Levaquin [levofloxacin in d5w]; Soma [carisoprodol]; and Zofran [ondansetron hcl]  Family History  Problem Relation Age of Onset  . Cirrhosis Mother   . Colon cancer Mother     Social History Social History  Substance Use Topics  . Smoking status: Former Smoker    Types: Cigarettes  . Smokeless tobacco: Former Systems developer  . Alcohol use No    Review of Systems Constitutional: No fever/chills Eyes: No visual changes. ENT: No sore throat. Cardiovascular: Denies chest pain. Respiratory: Denies shortness of breath. Gastrointestinal: No abdominal pain.  No nausea, no vomiting.  No diarrhea.  No constipation. Genitourinary: Negative for dysuria. Musculoskeletal: Negative for back pain. Skin: Negative for rash. Neurological: Negative for headaches, focal weakness or numbness.    ____________________________________________   PHYSICAL EXAM:  VITAL SIGNS: ED Triage Vitals  Enc Vitals Group     BP 10/23/16 0925 (!) 154/93     Pulse Rate 10/23/16 0925 93     Resp 10/23/16 0925 14     Temp 10/23/16 0925 97.8 F (36.6 C)     Temp Source 10/23/16 0925 Oral     SpO2 10/23/16 0925 100 %     Weight 10/23/16 0925 133 lb (60.3 kg)     Height 10/23/16 0925 5' 4"  (1.626 m)     Head Circumference --      Peak Flow --      Pain Score 10/23/16 0924 8     Pain Loc --      Pain Edu? --      Excl. in [place]? --     Constitutional: Alert and oriented. Well  appearing and in no acute distress. Eyes: Conjunctivae are normal. Head: Atraumatic. Nose: No congestion/rhinnorhea. Mouth/Throat: Mucous membranes are moist. Neck: No stridor.   Cardiovascular: Normal rate, regular rhythm. Grossly normal heart sounds.  Good peripheral circulation. Respiratory: Normal respiratory effort.  No retractions. Lungs CTAB. Gastrointestinal: Soft and nontender throughout the lower abdomen, reports mild to moderate discomfort in the upper abdomen with no rebound or guarding. Negative Murphy.. No distention. Musculoskeletal: No lower extremity tenderness nor edema. Neurologic:  Normal speech and language. No gross focal neurologic deficits are appreciated.  Skin:  Skin is warm, dry and intact. No rash noted. Psychiatric: Mood and affect are normal. Speech and behavior are normal.  ____________________________________________  LABS (all labs ordered are listed, but only abnormal results are displayed)  Labs Reviewed  URINALYSIS, COMPLETE (UACMP) WITH MICROSCOPIC - Abnormal; Notable for the following:       Result Value   Color, Urine YELLOW (*)    APPearance CLEAR (*)    All other components within normal limits  COMPREHENSIVE METABOLIC PANEL - Abnormal; Notable for the following:    Glucose, Bld 236 (*)    Calcium 8.6 (*)    Total Bilirubin 4.9 (*)    All other components within normal limits  GASTROINTESTINAL PANEL BY PCR, STOOL (REPLACES STOOL CULTURE)  CBC  LIPASE, BLOOD  PROTIME-INR  OCCULT BLOOD X 1 CARD TO LAB, STOOL   ____________________________________________  EKG  Reviewed and interpreted by me at 9:30 AM Heart rate 90 Care is a QTc 440 Normal sinus rhythm, no evidence of acute ischemic changes noted ____________________________________________  RADIOLOGY  Ct Abdomen Pelvis W Contrast  Result Date: 10/23/2016 CLINICAL DATA:  Complains of abdominal pain and epigastric burning extending into her back x 1 week. Also states blood in  stool. Hx of pancreatitis, GERD, chole, bowel resection, appendectomy, bladder surgery and hysterectomy. EXAM: CT ABDOMEN AND PELVIS WITH CONTRAST TECHNIQUE: Multidetector CT imaging of the abdomen and pelvis was performed using the standard protocol following bolus administration of intravenous contrast. CONTRAST:  13m ISOVUE-300 IOPAMIDOL (ISOVUE-300) INJECTION 61% COMPARISON:  10/28/2015 FINDINGS: Lower chest: No acute abnormality. Hepatobiliary: Diffuse hepatic low attenuation as can be seen with hepatic steatosis. No focal liver abnormality is seen. Status post cholecystectomy. No biliary dilatation. Pancreas: Severe atrophy of the pancreatic body and tail. Pancreatic at appears normal. Spleen: Normal in size without focal abnormality. Adrenals/Urinary Tract: Adrenal glands are unremarkable. 15 mm hypodense, fluid attenuating left renal mass most consistent with a cyst. Kidneys are otherwise normal, without renal calculi, solid mass, or hydronephrosis. Bladder is unremarkable. Stomach/Bowel: Stomach is within normal limits. No bowel dilatation. No pneumatosis, pneumoperitoneum or portal venous gas. Relative bowel wall thickening involving the descending and sigmoid colon which may be secondary to underdistention versus mild colitis. Vascular/Lymphatic: No significant vascular findings are present. No enlarged abdominal or pelvic lymph nodes. Reproductive: Status post hysterectomy. No adnexal masses. Other: No fluid collection or hematoma. Musculoskeletal: No acute osseous abnormality. No lytic or sclerotic osseous lesion. IMPRESSION: 1. Relative bowel wall thickening involving the descending and sigmoid colon which may be secondary to underdistention versus mild colitis. 2. Hepatic steatosis. Electronically Signed   By: HKathreen Devoid  On: 10/23/2016 12:48    ____________________________________________   PROCEDURES  Procedure(s) performed: None  Procedures  Critical Care performed:  No  ____________________________________________   INITIAL IMPRESSION / ASSESSMENT AND PLAN / ED COURSE  Pertinent labs & imaging results that were available during my care of the patient were reviewed by me and considered in my medical decision making (see chart for details).  Differential diagnosis includes but is not limited to, abdominal perforation, aortic dissection, cholecystitis, appendicitis, diverticulitis, colitis, esophagitis/gastritis, kidney stone, pyelonephritis, urinary tract infection, aortic aneurysm. All are considered in decision and treatment plan. Based upon the patient's presentation and risk factors, and her complicated previous medical history including that of liver disease which appears to be stable by labs today, we'll proceed with CT scan to further evaluate for etiology of pain and discomfort. Based on the symptoms of crampy discomfort and loose stools I'm suspicious this may represent colitis, given the patient's age and risk factors think it is unlikely to represent ischemic colitis  and given symptomatology been present for a week.   ----------------------------------------- 2:39 PM on 10/23/2016 -----------------------------------------  Patient reports she is feeling better. She is not any further bowel movements in the emergency room. She would like a prescription for brief pain medicine to go home with, reports she is taking hydrocodone and oxycodone in the past without problem. We'll provide her oxycodone here, for very short prescription. She is NOT driving herself home.  I will prescribe the patient a narcotic pain medicine due to their condition which I anticipate will cause at least moderate pain short term. Patient is very agreeable to only use as prescribed and to never use more than prescribed.  Return precautions and treatment recommendations and follow-up discussed with the patient who is agreeable with the plan. She'll follow-up with  gastroenterology and her primary doctor.        ____________________________________________   FINAL CLINICAL IMPRESSION(S) / ED DIAGNOSES  Final diagnoses:  Colitis      NEW MEDICATIONS STARTED DURING THIS VISIT:  New Prescriptions   CIPROFLOXACIN (CIPRO) 500 MG TABLET    Take 1 tablet (500 mg total) by mouth 2 (two) times daily.   METRONIDAZOLE (FLAGYL) 500 MG TABLET    Take 1 tablet (500 mg total) by mouth 3 (three) times daily.     Note:  This document was prepared using Dragon voice recognition software and may include unintentional dictation errors.     Delman Kitten, MD 10/23/16 1447

## 2016-10-23 NOTE — ED Notes (Signed)
Patient transported to X-ray 

## 2016-10-23 NOTE — ED Notes (Signed)
Pt unable to provide stool sample at this time

## 2016-10-23 NOTE — ED Triage Notes (Signed)
Pt presents via POV c/o abd pain and epigastric burning extending into her back. Pt presented Friday with same complaints but left prior to being seen by MD due to resolution of symptoms. Pt reports pain returned yesterday. Reports symptoms began initially x1 week ago. Also reports blood in stool.

## 2016-11-02 ENCOUNTER — Emergency Department
Admission: EM | Admit: 2016-11-02 | Discharge: 2016-11-02 | Disposition: A | Discharge status: Home / Self Care | Payer: Medicare Other | Attending: Emergency Medicine | Admitting: Emergency Medicine

## 2016-11-02 ENCOUNTER — Encounter: Payer: Self-pay | Admitting: *Deleted

## 2016-11-02 ENCOUNTER — Emergency Department: Payer: Medicare Other

## 2016-11-02 DIAGNOSIS — R079 Chest pain, unspecified: Secondary | ICD-10-CM | POA: Diagnosis not present

## 2016-11-02 DIAGNOSIS — R197 Diarrhea, unspecified: Secondary | ICD-10-CM

## 2016-11-02 DIAGNOSIS — I10 Essential (primary) hypertension: Secondary | ICD-10-CM | POA: Diagnosis not present

## 2016-11-02 DIAGNOSIS — Z79899 Other long term (current) drug therapy: Secondary | ICD-10-CM | POA: Diagnosis not present

## 2016-11-02 DIAGNOSIS — Z7982 Long term (current) use of aspirin: Secondary | ICD-10-CM | POA: Diagnosis not present

## 2016-11-02 DIAGNOSIS — Z794 Long term (current) use of insulin: Secondary | ICD-10-CM | POA: Insufficient documentation

## 2016-11-02 DIAGNOSIS — Z91040 Latex allergy status: Secondary | ICD-10-CM | POA: Insufficient documentation

## 2016-11-02 DIAGNOSIS — R109 Unspecified abdominal pain: Secondary | ICD-10-CM | POA: Diagnosis present

## 2016-11-02 DIAGNOSIS — E1165 Type 2 diabetes mellitus with hyperglycemia: Secondary | ICD-10-CM | POA: Diagnosis not present

## 2016-11-02 DIAGNOSIS — R110 Nausea: Secondary | ICD-10-CM | POA: Diagnosis not present

## 2016-11-02 DIAGNOSIS — Z87891 Personal history of nicotine dependence: Secondary | ICD-10-CM | POA: Diagnosis not present

## 2016-11-02 DIAGNOSIS — R1013 Epigastric pain: Secondary | ICD-10-CM

## 2016-11-02 DIAGNOSIS — R739 Hyperglycemia, unspecified: Secondary | ICD-10-CM

## 2016-11-02 LAB — CBC
HCT: 43.5 % (ref 35.0–47.0)
Hemoglobin: 15.4 g/dL (ref 12.0–16.0)
MCH: 31.9 pg (ref 26.0–34.0)
MCHC: 35.4 g/dL (ref 32.0–36.0)
MCV: 90.2 fL (ref 80.0–100.0)
Platelets: 221 10*3/uL (ref 150–440)
RBC: 4.83 MIL/uL (ref 3.80–5.20)
RDW: 12.9 % (ref 11.5–14.5)
WBC: 7.9 10*3/uL (ref 3.6–11.0)

## 2016-11-02 LAB — GASTROINTESTINAL PANEL BY PCR, STOOL (REPLACES STOOL CULTURE)

## 2016-11-02 LAB — URINALYSIS, COMPLETE (UACMP) WITH MICROSCOPIC
Bilirubin Urine: NEGATIVE
Glucose, UA: >=500 mg/dL — AB
Hgb urine dipstick: NEGATIVE
Ketones, ur: 5 mg/dL — AB
Leukocytes, UA: NEGATIVE
Nitrite: NEGATIVE
Protein, ur: NEGATIVE mg/dL
Specific Gravity, Urine: 1.027 (ref 1.005–1.030)
pH: 6 (ref 5.0–8.0)

## 2016-11-02 LAB — COMPREHENSIVE METABOLIC PANEL
ALT: 60 U/L — ABNORMAL HIGH (ref 14–54)
AST: 33 U/L (ref 15–41)
Albumin: 4.6 g/dL (ref 3.5–5.0)
Alkaline Phosphatase: 141 U/L — ABNORMAL HIGH (ref 38–126)
Anion gap: 12 (ref 5–15)
BUN: 20 mg/dL (ref 6–20)
CO2: 26 mmol/L (ref 22–32)
Calcium: 9 mg/dL (ref 8.9–10.3)
Chloride: 93 mmol/L — ABNORMAL LOW (ref 101–111)
Creatinine, Ser: 0.87 mg/dL (ref 0.44–1.00)
GFR calc Af Amer: >60 mL/min (ref >60)
GFR calc non Af Amer: >60 mL/min (ref >60)
Glucose, Bld: 590 mg/dL (ref 65–99)
Potassium: 4.6 mmol/L (ref 3.5–5.1)
Sodium: 131 mmol/L — ABNORMAL LOW (ref 135–145)
Total Bilirubin: 5.3 mg/dL — ABNORMAL HIGH (ref 0.3–1.2)
Total Protein: 8 g/dL (ref 6.5–8.1)

## 2016-11-02 LAB — GLUCOSE, CAPILLARY
Glucose-Capillary: 395 mg/dL — ABNORMAL HIGH (ref 65–99)
Glucose-Capillary: 247 mg/dL — ABNORMAL HIGH (ref 65–99)

## 2016-11-02 LAB — C DIFFICILE QUICK SCREEN W PCR REFLEX
C Diff antigen: NEGATIVE
C Diff interpretation: NOT DETECTED
C Diff toxin: NEGATIVE

## 2016-11-02 LAB — LIPASE, BLOOD: Lipase: 21 U/L (ref 11–51)

## 2016-11-02 LAB — TROPONIN I: Troponin I: <0.03 ng/mL (ref <0.03)

## 2016-11-02 MED ORDER — INSULIN ASPART 100 UNIT/ML ~~LOC~~ SOLN
5.0000 [IU] | Freq: Once | SUBCUTANEOUS | Status: AC
  Administered 2016-11-02: 5 [IU] via SUBCUTANEOUS
  Filled 2016-11-02: qty 1

## 2016-11-02 MED ORDER — IOPAMIDOL (ISOVUE-300) INJECTION 61%: 30.0000 mL | Freq: Once | INTRAVENOUS | Status: DC | PRN

## 2016-11-02 MED ORDER — DICYCLOMINE HCL 20 MG PO TABS
20.0000 mg | ORAL_TABLET | Freq: Three times a day (TID) | ORAL | 0 refills | Status: DC | PRN
Start: 2016-11-02 — End: 2017-04-10

## 2016-11-02 MED ORDER — FENTANYL CITRATE (PF) 100 MCG/2ML IJ SOLN
50.0000 ug | Freq: Once | INTRAMUSCULAR | Status: AC
  Administered 2016-11-02: 50 ug via INTRAMUSCULAR
  Filled 2016-11-02: qty 2

## 2016-11-02 MED ORDER — PANTOPRAZOLE SODIUM 40 MG PO TBEC
DELAYED_RELEASE_TABLET | ORAL | Status: AC
  Filled 2016-11-02: qty 1

## 2016-11-02 MED ORDER — OMEPRAZOLE 40 MG PO CPDR: 40.0000 mg | DELAYED_RELEASE_CAPSULE | Freq: Every day | ORAL | 0 refills | Status: DC

## 2016-11-02 MED ORDER — PANTOPRAZOLE SODIUM 40 MG PO TBEC
40.0000 mg | DELAYED_RELEASE_TABLET | Freq: Once | ORAL | Status: AC
  Administered 2016-11-02: 40 mg via ORAL

## 2016-11-02 NOTE — ED Notes (Signed)
Patient is back from imaging.

## 2016-11-02 NOTE — ED Provider Notes (Signed)
[facility] Emergency Department Provider Note  ____________________________________________   First MD Initiated Contact with Patient 11/02/16 1205     (approximate)  I have reviewed the triage vital signs and the nursing notes.   HISTORY  Chief Complaint Abdominal Pain   HPI Amber Christensen is a 51 y.o. female with a history of gastric varices, diabetes, hypertension and a recent diagnosis of colitis was presenting to the emergency department with epigastric abdominal pain. She says that she had recently on September 9 been diagnosed with colitis and given Cipro and Flagyl. She is no longer taking antibiotics and says that the pain is now increased. Says the pain is mostly to the right side of her abdomen at this time as versus the right lower quadrant. Says that she has had bowel resections as well as and appendectomy in the past. She denies vomiting but says that she does feel nauseous. Says that she is having up to 10 episodes of diarrhea per day with streaks of blood. Says the diarrhea has been yellow. She says the pain is a 1010 at this time without radiation.   Past Medical History:  Diagnosis Date  . Diabetes mellitus without complication ([place])   . GERD (gastroesophageal reflux disease)   . Hypertension   . Liver disease   . Pancreatitis     Patient Active Problem List   Diagnosis Date Noted  . Vasculitis ([place]) 08/03/2016  . Acute maculopapular rash 04/21/2016  . Maculopapular rash, generalized 04/20/2016  . Unstable angina ([place]) 09/28/2015    Past Surgical History:  Procedure Laterality Date  . ABDOMINAL HYSTERECTOMY    . APPENDECTOMY    . BLADDER SURGERY    . BOWEL RESECTION    . BREAST BIOPSY Left 1989  . CHOLECYSTECTOMY    . CYST REMOVAL HAND Left 1992   Ganglion cyst removed from Left Wrist  . DILATATION & CURETTAGE/HYSTEROSCOPY WITH MYOSURE    . KNEE ARTHROSCOPY Left 1992  . PANCREAS SURGERY    . REPAIR OF ESOPHAGUS  2012   3  clamps placed in esophagus    Prior to Admission medications   Medication Sig Start Date End Date Taking? Authorizing Provider  aspirin EC 81 MG tablet Take 81 mg by mouth daily.   Yes [provider]  CALCIUM-MAGNESIUM-ZINC PO Take 1 tablet by mouth daily.   Yes [provider]  Cholecalciferol (D3-1000 PO) Take 1,000 Units by mouth daily.   Yes [provider]  NOVOLOG 100 UNIT/ML injection 40 Units by Other route daily. Use up to 40 units daily via insulin pump. 07/24/16  Yes [provider]  oxyCODONE (OXY IR/ROXICODONE) 5 MG immediate release tablet Take 1 tablet (5 mg total) by mouth every 6 (six) hours as needed for severe pain. 10/23/16  Yes Delman Kitten, MD  pantoprazole (PROTONIX) 40 MG tablet Take 1 tablet (40 mg total) by mouth 2 (two) times daily. 09/29/15  Yes Sainani, Belia Heman, MD  potassium chloride SA (K-DUR,KLOR-CON) 20 MEQ tablet Take 1 tablet (20 mEq total) by mouth 2 (two) times daily. 04/19/16  Yes Harvest Dark, MD  potassium gluconate 595 (99 K) MG TABS tablet Take 595 mg by mouth daily.   Yes [provider]  vitamin E 400 UNIT capsule Take 400 Units by mouth daily.   Yes [provider]  ciprofloxacin (CIPRO) 500 MG tablet Take 1 tablet (500 mg total) by mouth 2 (two) times daily. Patient not taking: Reported on 11/02/2016 10/23/16  Delman Kitten, MD  colchicine 0.6 MG tablet Take 1 tablet (0.6 mg total) by mouth daily. Patient not taking: Reported on 11/02/2016 08/06/16   Loletha Grayer, MD  insulin lispro (HUMALOG) 100 UNIT/ML injection Inject 0.04 mLs (4 Units total) into the skin 3 (three) times daily with meals. Pt uses based off the amount of carbs she eats at each meal. Patient not taking: Reported on 11/02/2016 09/29/15   Henreitta Leber, MD  loratadine (CLARITIN) 10 MG tablet Take 1 tablet (10 mg total) by mouth daily. Patient not taking: Reported on 11/02/2016 08/06/16   Loletha Grayer, MD  metroNIDAZOLE (FLAGYL)  500 MG tablet Take 1 tablet (500 mg total) by mouth 3 (three) times daily. Patient not taking: Reported on 11/02/2016 10/23/16   Delman Kitten, MD  predniSONE (DELTASONE) 5 MG tablet 7 tabs po day1; 6 tabs po day day2; 5 tabs po day3; 4 tabs po day4; 3 tabs po day5; 2 tabs po day6; 1 tab po day7 Patient not taking: Reported on 11/02/2016 08/06/16   Loletha Grayer, MD  promethazine (PHENERGAN) 12.5 MG tablet Take 1 tablet (12.5 mg total) by mouth every 6 (six) hours as needed for nausea or vomiting. Patient not taking: Reported on 11/02/2016 04/06/16   Merlyn Lot, MD    Allergies Morphine and related; Latex; Demerol [meperidine]; Dilaudid [hydromorphone hcl]; Darvon [propoxyphene]; Flexeril [cyclobenzaprine]; Levaquin [levofloxacin in d5w]; Soma [carisoprodol]; and Zofran [ondansetron hcl]  Family History  Problem Relation Age of Onset  . Cirrhosis Mother   . Colon cancer Mother     Social History Social History  Substance Use Topics  . Smoking status: Former Smoker    Types: Cigarettes  . Smokeless tobacco: Former Systems developer  . Alcohol use No    Review of Systems  Constitutional: No fever/chills Eyes: No visual changes. ENT: No sore throat. Cardiovascular: Denies chest pain. Respiratory: Denies shortness of breath. Gastrointestinalno vomiting.    No constipation. Genitourinary: Negative for dysuria. Musculoskeletal: Negative for back pain. Skin: Negative for rash. Neurological: Negative for headaches, focal weakness or numbness.   ____________________________________________   PHYSICAL EXAM:  VITAL SIGNS: ED Triage Vitals  Enc Vitals Group     BP 11/02/16 1114 (!) 139/100     Pulse Rate 11/02/16 1114 (!) 115     Resp 11/02/16 1114 16     Temp 11/02/16 1114 99 F (37.2 C)     Temp Source 11/02/16 1114 Oral     SpO2 11/02/16 1114 96 %     Weight 11/02/16 1114 133 lb (60.3 kg)     Height 11/02/16 1114 5' 4"  (1.626 m)     Head Circumference --      Peak Flow --       Pain Score 11/02/16 1113 8     Pain Loc --      Pain Edu? --      Excl. in [place]? --     Constitutional: Alert and oriented. Well appearing and in no acute distress. Eyes: Conjunctivae are normal.  Head: Atraumatic. Nose: No congestion/rhinnorhea. Mouth/Throat: Mucous membranes are moist.  Neck: No stridor.   Cardiovascular: Normal rate, regular rhythm. Grossly normal heart sounds.   Respiratory: Normal respiratory effort.  No retractions. Lungs CTAB. Gastrointestinal: Soft moderate tenderness to palpation which is worse to the right lower quadrant. No distention. No CVA tenderness. Musculoskeletal: No lower extremity tenderness nor edema.  No joint effusions. Neurologic:  Normal speech and language. No gross focal neurologic deficits are appreciated. Skin:  Skin  is warm, dry and intact. No rash noted. Psychiatric: Mood and affect are normal. Speech and behavior are normal.  ____________________________________________   LABS (all labs ordered are listed, but only abnormal results are displayed)  Labs Reviewed  COMPREHENSIVE METABOLIC PANEL - Abnormal; Notable for the following:       Result Value   Sodium 131 (*)    Chloride 93 (*)    Glucose, Bld 590 (*)    ALT 60 (*)    Alkaline Phosphatase 141 (*)    Total Bilirubin 5.3 (*)    All other components within normal limits  URINALYSIS, COMPLETE (UACMP) WITH MICROSCOPIC - Abnormal; Notable for the following:    Color, Urine STRAW (*)    APPearance CLEAR (*)    Glucose, UA >=500 (*)    Ketones, ur 5 (*)    Bacteria, UA RARE (*)    Squamous Epithelial / LPF 0-5 (*)    All other components within normal limits  GLUCOSE, CAPILLARY - Abnormal; Notable for the following:    Glucose-Capillary 395 (*)    All other components within normal limits  LIPASE, BLOOD  CBC  CBG MONITORING, ED   ____________________________________________  EKG  ED ECG REPORT I, Doran Stabler, the attending physician, personally viewed and  interpreted this ECG.   Date: 11/02/2016  EKG Time: 1707  Rate: 95  Rhythm: normal sinus rhythm  Axis: normal  Intervals:none  ST&T Change: no ST segment elevation or depression. No abnormal T-wave inversion. No S1 every 3 T3 pattern or signs of right heart strain.  ____________________________________________  RADIOLOGY  no acute finding explain the patient's pain. Does have appears to be loose stool throughout the colon. ____________________________________________   PROCEDURES  Procedure(s) performed:  very difficult IV access without ability to gain axis with a peripheral IV the nursing stick initially, bilateral EJ's and O sounds the right upper extremity. Procedures  Critical Care performed:   ____________________________________________   INITIAL IMPRESSION / ASSESSMENT AND PLAN / ED COURSE  Pertinent labs & imaging results that were available during my care of the patient were reviewed by me and considered in my medical decision making (see chart for details).  ----------------------------------------- 4:57 PM on 11/02/2016 -----------------------------------------  Patient without any distress at this time.she had a large amount of soft but formed stool that is not grossly bloody. She is noted to have an elevated bilirubin that appears to be at her baseline. Her glucose is lower than previous in the 200s now. She is without any distress. Although the stool did not appear loose we will send it for stool studies as precaution because of the duration of her symptoms.patient now admitting that she feels a burning coming up from her abdomen up into her chest. She says that she has had this similar burning in the past and has been relieved with a GI cocktail. She says that she also used to be on Prilosec but has been discontinued from it. We will check a troponin as long as the troponin is within normal limits she'll be discharged with a prescription for Prilosec, as she has  been on before. She'll also be discharged with Bentyl. She'll be following up with her hepatologist at [facility]. She is understanding the plan and willing to comply.patient reporting any shortness of breath. No pleurisy. Unlikely to be PE.      ____________________________________________   FINAL CLINICAL IMPRESSION(S) / ED DIAGNOSES  chest pain. Nausea and diarrhea. Abdominal pain.    NEW MEDICATIONS  STARTED DURING THIS VISIT:  New Prescriptions   No medications on file     Note:  This document was prepared using Dragon voice recognition software and may include unintentional dictation errors.     Orbie Pyo, MD 11/02/16 [phone]

## 2016-11-02 NOTE — ED Notes (Signed)
Dr. Clearnce Hasten aware of BS 395

## 2016-11-02 NOTE — ED Notes (Signed)
One unsuccessful IV attempt. Patient states she usually has to have an EJ. Dr. Clearnce Hasten aware.

## 2016-11-02 NOTE — ED Triage Notes (Signed)
PT to ED reporting continued upper abd pain and epigastric pain. Pt was seen 10/23/16 for same complaint and reports pain has increased and is now burning in nature with bloating, diarrhea with blood streaks and nausea. Pt reports cold chills and sweats but denies fevers at home. No vomiting reported.   Pt had a follow up with GI and [place] was performed but pt does not know results and was told by GI they could not schedule aa follow up until February 2019. Pt reports pain has increased since [place].

## 2016-11-02 NOTE — ED Notes (Signed)
Date and time results received: 11/02/16 1158 (use smartphrase ".now" to insert current time)  Test: Glucose Critical Value: 590  Name of Provider Notified: Jimmye Norman  Orders Received? Or Actions Taken?: Pt taken to room

## 2016-11-02 NOTE — ED Notes (Signed)
Spoke with diabetic coordinator who knows this patient. Patient was able to demonstrate how to use her insulin pump appropriately according to different scenarios involving carb count or sliding scale. Patient did not bolus through her insulin pump while in the ED to this writer's knowledge.

## 2016-11-02 NOTE — ED Notes (Signed)
Dr. Clearnce Hasten attempted one EJ placement which was unsuccessful and two unsuccessful ultrasound-guided attempts.

## 2016-11-02 NOTE — Progress Notes (Signed)
Note that patient in the ED with abdominal pain and elevated CBG's.   According to RN, patient is wearing insulin pump at this time Blood sugars have gone from 590 mg/dL to 395 mg/dl.  Unclear if patient has bolused however according to her last visit with endocrinologist, she does have basal and bolus settings in her pump. At her last office visit on 08/11/16, patient insulin pump settings were:  Basal rates 12 am 0.4 units/hr  2 pm 0.6 units/hr 24-hr basal = 11.6 units  Bolus settings I:C ratio 1:23 Sensitivty 80 Target 100-110 Max bolus 15 units   If patient is admitted, she will need orders for use of her insulin pump in the hospital. RN states she will discuss with MD.  May need f/u visit with Dr. Gabriel Carina as well.   Thanks, Adah Perl, RN, BC-ADM Inpatient Diabetes Coordinator Pager [phone] (8a-5p)

## 2016-11-08 DIAGNOSIS — I10 Essential (primary) hypertension: Secondary | ICD-10-CM | POA: Insufficient documentation

## 2016-12-08 DIAGNOSIS — R519 Headache, unspecified: Secondary | ICD-10-CM | POA: Insufficient documentation

## 2017-01-06 ENCOUNTER — Emergency Department: Payer: Medicare Other

## 2017-01-06 ENCOUNTER — Encounter: Payer: Self-pay | Admitting: Emergency Medicine

## 2017-01-06 ENCOUNTER — Emergency Department
Admission: EM | Admit: 2017-01-06 | Discharge: 2017-01-06 | Disposition: A | Discharge status: Home / Self Care | Payer: Medicare Other | Attending: Emergency Medicine | Admitting: Emergency Medicine

## 2017-01-06 DIAGNOSIS — S59902A Unspecified injury of left elbow, initial encounter: Secondary | ICD-10-CM | POA: Diagnosis present

## 2017-01-06 DIAGNOSIS — Y93K1 Activity, walking an animal: Secondary | ICD-10-CM | POA: Diagnosis not present

## 2017-01-06 DIAGNOSIS — Z91040 Latex allergy status: Secondary | ICD-10-CM | POA: Insufficient documentation

## 2017-01-06 DIAGNOSIS — Y999 Unspecified external cause status: Secondary | ICD-10-CM | POA: Diagnosis not present

## 2017-01-06 DIAGNOSIS — S53005A Unspecified dislocation of left radial head, initial encounter: Secondary | ICD-10-CM | POA: Insufficient documentation

## 2017-01-06 DIAGNOSIS — I10 Essential (primary) hypertension: Secondary | ICD-10-CM | POA: Insufficient documentation

## 2017-01-06 DIAGNOSIS — Y929 Unspecified place or not applicable: Secondary | ICD-10-CM | POA: Insufficient documentation

## 2017-01-06 DIAGNOSIS — Z794 Long term (current) use of insulin: Secondary | ICD-10-CM | POA: Insufficient documentation

## 2017-01-06 DIAGNOSIS — E119 Type 2 diabetes mellitus without complications: Secondary | ICD-10-CM | POA: Insufficient documentation

## 2017-01-06 DIAGNOSIS — S53105A Unspecified dislocation of left ulnohumeral joint, initial encounter: Secondary | ICD-10-CM

## 2017-01-06 DIAGNOSIS — W010XXA Fall on same level from slipping, tripping and stumbling without subsequent striking against object, initial encounter: Secondary | ICD-10-CM | POA: Insufficient documentation

## 2017-01-06 DIAGNOSIS — Z79899 Other long term (current) drug therapy: Secondary | ICD-10-CM | POA: Insufficient documentation

## 2017-01-06 DIAGNOSIS — Z87891 Personal history of nicotine dependence: Secondary | ICD-10-CM | POA: Diagnosis not present

## 2017-01-06 DIAGNOSIS — W19XXXA Unspecified fall, initial encounter: Secondary | ICD-10-CM

## 2017-01-06 MED ORDER — OXYCODONE HCL 5 MG PO TABS: 5.0000 mg | ORAL_TABLET | Freq: Three times a day (TID) | ORAL | 0 refills | Status: DC | PRN

## 2017-01-06 MED ORDER — FENTANYL CITRATE (PF) 100 MCG/2ML IJ SOLN
75.0000 ug | Freq: Once | INTRAMUSCULAR | Status: AC
  Administered 2017-01-06: 75 ug via INTRAMUSCULAR
  Filled 2017-01-06: qty 2

## 2017-01-06 MED ORDER — ONDANSETRON 4 MG PO TBDP
ORAL_TABLET | ORAL | Status: AC
  Filled 2017-01-06: qty 1

## 2017-01-06 MED ORDER — KETAMINE HCL 50 MG/ML IJ SOLN
4.0000 mg/kg | Freq: Once | INTRAMUSCULAR | Status: AC
  Administered 2017-01-06: 245 mg via INTRAMUSCULAR
  Filled 2017-01-06: qty 10

## 2017-01-06 MED ORDER — OXYCODONE HCL 5 MG PO TABS
5.0000 mg | ORAL_TABLET | ORAL | Status: AC
  Administered 2017-01-06: 5 mg via ORAL

## 2017-01-06 MED ORDER — ONDANSETRON 4 MG PO TBDP
4.0000 mg | ORAL_TABLET | Freq: Once | ORAL | Status: AC
  Administered 2017-01-06: 4 mg via ORAL

## 2017-01-06 MED ORDER — OXYCODONE HCL 5 MG PO TABS
ORAL_TABLET | ORAL | Status: AC
  Filled 2017-01-06: qty 1

## 2017-01-06 NOTE — Discharge Instructions (Signed)
Please seek medical attention for any high fevers, chest pain, shortness of breath, change in behavior, persistent vomiting, bloody stool or any other new or concerning symptoms.  

## 2017-01-06 NOTE — ED Provider Notes (Signed)
[facility] Emergency Department Provider Note   ____________________________________________   I have reviewed the triage vital signs and the nursing notes.   HISTORY  Chief Complaint Arm Injury   History limited by: Not Limited   HPI Amber Christensen is a 51 y.o. female who presents to the emergency department today because of left elbow pain.   LOCATION:left elbow DURATION:started this afternoon TIMING: pain started immediately after fall SEVERITY: severe QUALITY: pain CONTEXT: patient states she was walking her dog when it went after a cat and pulled her down. She tried to catch herself with her left arm.  MODIFYING FACTORS: worse with movement ASSOCIATED SYMPTOMS: hip pain. Tingling going down her arm  Per medical record review patient has a history of pancreatitis, multiple pain medication allergies.   Past Medical History:  Diagnosis Date  . Diabetes mellitus without complication ([place])   . GERD (gastroesophageal reflux disease)   . Hypertension   . Liver disease   . Pancreatitis     Patient Active Problem List   Diagnosis Date Noted  . Vasculitis ([place]) 08/03/2016  . Acute maculopapular rash 04/21/2016  . Maculopapular rash, generalized 04/20/2016  . Unstable angina ([place]) 09/28/2015    Past Surgical History:  Procedure Laterality Date  . ABDOMINAL HYSTERECTOMY    . APPENDECTOMY    . BLADDER SURGERY    . BOWEL RESECTION    . BREAST BIOPSY Left 1989  . CHOLECYSTECTOMY    . CYST REMOVAL HAND Left 1992   Ganglion cyst removed from Left Wrist  . DILATATION & CURETTAGE/HYSTEROSCOPY WITH MYOSURE    . KNEE ARTHROSCOPY Left 1992  . PANCREAS SURGERY    . REPAIR OF ESOPHAGUS  2012   3 clamps placed in esophagus    Prior to Admission medications   Medication Sig Start Date End Date Taking? Authorizing Provider  aspirin EC 81 MG tablet Take 81 mg by mouth daily.    [provider]  CALCIUM-MAGNESIUM-ZINC PO Take 1 tablet by  mouth daily.    [provider]  Cholecalciferol (D3-1000 PO) Take 1,000 Units by mouth daily.    [provider]  ciprofloxacin (CIPRO) 500 MG tablet Take 1 tablet (500 mg total) by mouth 2 (two) times daily. Patient not taking: Reported on 11/02/2016 10/23/16   Delman Kitten, MD  colchicine 0.6 MG tablet Take 1 tablet (0.6 mg total) by mouth daily. Patient not taking: Reported on 11/02/2016 08/06/16   Loletha Grayer, MD  dicyclomine (BENTYL) 20 MG tablet Take 1 tablet (20 mg total) by mouth 3 (three) times daily as needed for spasms. 11/02/16 11/02/17  Schaevitz, Randall An, MD  insulin lispro (HUMALOG) 100 UNIT/ML injection Inject 0.04 mLs (4 Units total) into the skin 3 (three) times daily with meals. Pt uses based off the amount of carbs she eats at each meal. Patient not taking: Reported on 11/02/2016 09/29/15   Henreitta Leber, MD  loratadine (CLARITIN) 10 MG tablet Take 1 tablet (10 mg total) by mouth daily. Patient not taking: Reported on 11/02/2016 08/06/16   Loletha Grayer, MD  metroNIDAZOLE (FLAGYL) 500 MG tablet Take 1 tablet (500 mg total) by mouth 3 (three) times daily. Patient not taking: Reported on 11/02/2016 10/23/16   Delman Kitten, MD  NOVOLOG 100 UNIT/ML injection 40 Units by Other route daily. Use up to 40 units daily via insulin pump. 07/24/16   [provider]  omeprazole (PRILOSEC) 40 MG capsule Take 1 capsule (40 mg total) by mouth daily. 11/02/16  11/02/17  Schaevitz, Randall An, MD  oxyCODONE (OXY IR/ROXICODONE) 5 MG immediate release tablet Take 1 tablet (5 mg total) by mouth every 6 (six) hours as needed for severe pain. 10/23/16   Delman Kitten, MD  pantoprazole (PROTONIX) 40 MG tablet Take 1 tablet (40 mg total) by mouth 2 (two) times daily. 09/29/15   Henreitta Leber, MD  potassium chloride SA (K-DUR,KLOR-CON) 20 MEQ tablet Take 1 tablet (20 mEq total) by mouth 2 (two) times daily. 04/19/16   Harvest Dark, MD  potassium gluconate 595 (99 K) MG TABS  tablet Take 595 mg by mouth daily.    [provider]  predniSONE (DELTASONE) 5 MG tablet 7 tabs po day1; 6 tabs po day day2; 5 tabs po day3; 4 tabs po day4; 3 tabs po day5; 2 tabs po day6; 1 tab po day7 Patient not taking: Reported on 11/02/2016 08/06/16   Loletha Grayer, MD  promethazine (PHENERGAN) 12.5 MG tablet Take 1 tablet (12.5 mg total) by mouth every 6 (six) hours as needed for nausea or vomiting. Patient not taking: Reported on 11/02/2016 04/06/16   Merlyn Lot, MD  vitamin E 400 UNIT capsule Take 400 Units by mouth daily.    [provider]    Allergies Morphine and related; Latex; Demerol [meperidine]; Dilaudid [hydromorphone hcl]; Darvon [propoxyphene]; Flexeril [cyclobenzaprine]; Levaquin [levofloxacin in d5w]; Soma [carisoprodol]; and Zofran [ondansetron hcl]  Family History  Problem Relation Age of Onset  . Cirrhosis Mother   . Colon cancer Mother     Social History Social History   Tobacco Use  . Smoking status: Former Smoker    Types: Cigarettes  . Smokeless tobacco: Former Network engineer Use Topics  . Alcohol use: No  . Drug use: No    Review of Systems Constitutional: No fever/chills Eyes: No visual changes. ENT: No sore throat. Cardiovascular: Denies chest pain. Respiratory: Denies shortness of breath. Gastrointestinal: No abdominal pain.  No nausea, no vomiting.  No diarrhea.   Genitourinary: Negative for dysuria. Musculoskeletal: Positive for left elbow pain and left hip pain. Skin: Negative for rash. Neurological: Negative for headaches, focal weakness or numbness.  ____________________________________________   PHYSICAL EXAM:  VITAL SIGNS: ED Triage Vitals  Enc Vitals Group     BP --      Pulse Rate 01/06/17 1549 (!) 101     Resp 01/06/17 1549 18     Temp 01/06/17 1549 98.1 F (36.7 C)     Temp Source 01/06/17 1549 Oral     SpO2 01/06/17 1549 99 %     Weight 01/06/17 1549 135 lb (61.2 kg)     Height 01/06/17  1549 5' 4"  (1.626 m)     Head Circumference --      Peak Flow --      Pain Score 01/06/17 1548 10   Constitutional: Alert and oriented. Well appearing and in no distress. Eyes: Conjunctivae are normal.  ENT   Head: Normocephalic and atraumatic.   Nose: No congestion/rhinnorhea.   Mouth/Throat: Mucous membranes are moist.   Neck: No stridor. Hematological/Lymphatic/Immunilogical: No cervical lymphadenopathy. Cardiovascular: Normal rate, regular rhythm.  No murmurs, rubs, or gallops. Respiratory: Normal respiratory effort without tachypnea nor retractions. Breath sounds are clear and equal bilaterally. No wheezes/rales/rhonchi. Gastrointestinal: Soft and non tender. No rebound. No guarding.  Genitourinary: Deferred Musculoskeletal: Left elbow with deformity. Tender to palpation and manipulation. RP 2+ and sensation intact distally over all finger pads.  Neurologic:  Normal speech and language. No gross focal  neurologic deficits are appreciated.  Skin:  Skin is warm, dry and intact. No rash noted. Psychiatric: Mood and affect are normal. Speech and behavior are normal. Patient exhibits appropriate insight and judgment.  ____________________________________________    LABS (pertinent positives/negatives)  None  ____________________________________________   EKG  None  ____________________________________________    RADIOLOGY  Left elbow Posterior dislocation  Left elbow Anatomical positioning after reduction  I, Faige Seely, personally viewed and evaluated these images (plain radiographs) as part of my medical decision making. ____________________________________________   PROCEDURES  .Sedation Date/Time: 01/06/2017 4:31 PM Performed by: Nance Pear, MD Authorized by: Nance Pear, MD   Consent:    Consent obtained:  Written (electronic informed consent)   Consent given by:  Patient   Risks discussed:  Inadequate sedation, nausea,  vomiting, respiratory compromise necessitating ventilatory assistance and intubation and prolonged hypoxia resulting in organ damage Universal protocol:    Procedure explained and questions answered to patient or proxy's satisfaction: yes     Relevant documents present and verified: yes     Test results available and properly labeled: yes     Imaging studies available: yes     Required blood products, implants, devices, and special equipment available: yes     Immediately prior to procedure a time out was called: yes     Patient identity confirmation method:  Arm band Indications:    Procedure performed:  Dislocation reduction   Procedure necessitating sedation performed by:  Physician performing sedation   Intended level of sedation:  Moderate (conscious sedation) Pre-sedation assessment:    Time since last food or drink:  Unknown   ASA classification: class 2 - patient with mild systemic disease     Neck mobility: normal     Mouth opening:  2 finger widths   Mallampati score:  II - soft palate, uvula, fauces visible   Pre-sedation assessments completed and reviewed: airway patency, cardiovascular function, hydration status, mental status, nausea/vomiting, pain level, respiratory function and temperature     Pre-sedation assessment completed:  01/06/2017 4:33 PM Immediate pre-procedure details:    Reassessment: Patient reassessed immediately prior to procedure     Reviewed: vital signs, relevant labs/tests and NPO status     Verified: bag valve mask available, emergency equipment available, intubation equipment available, IV patency confirmed, oxygen available, reversal medications available and suction available   Procedure details (see MAR for exact dosages):    Preoxygenation:  Room air   Sedation:  Ketamine   Intra-procedure monitoring:  Blood pressure monitoring, continuous pulse oximetry, cardiac monitor, frequent vital sign checks and frequent LOC assessments   Intra-procedure  events: hypoxia     Intra-procedure management:  Supplemental oxygen   Total Provider sedation time (minutes):  25 Post-procedure details:    Post-sedation assessment completed:  01/06/2017 8:51 PM   Attendance: Constant attendance by certified staff until patient recovered     Recovery: Patient returned to pre-procedure baseline     Post-sedation assessments completed and reviewed: airway patency, cardiovascular function, hydration status, mental status and respiratory function     Patient is stable for discharge or admission: yes     Patient tolerance:  Tolerated well, no immediate complications     Reduction of dislocation Date: 01/06/2017 Performed by: Nance Pear Authorized by: Nance Pear Consent: Verbal consent obtained. Risks and benefits: risks, benefits and alternatives were discussed Consent given by: patient Required items: required blood products, implants, devices, and special equipment available Time out: Immediately prior to procedure a "time out"  was called to verify the correct patient, procedure, equipment, support staff and site/side marked as required.  Patient sedated: ketamine  Vitals: Vital signs were monitored during sedation. Patient tolerance: Patient tolerated the procedure well with no immediate complications. Joint: left elbow Reduction technique: traction countertraction    ____________________________________________   INITIAL IMPRESSION / ASSESSMENT AND PLAN / ED COURSE  Pertinent labs & imaging results that were available during my care of the patient were reviewed by me and considered in my medical decision making (see chart for details).  Patient presented to the emergency department today because of concerns for left elbow pain after a fall.  Differential would include fracture, dislocation, contusion.  X-rays does show posterior dislocation of the olecranon.  Patient underwent procedural sedation with good anatomic realignment  after reduction.  Patient was observed until she became mentally appropriate.  Will discharge home with pain medication.  [place] drug database was evaluated prior to prescriptions. Discussed with patient importance of orthopedic follow up. ____________________________________________   FINAL CLINICAL IMPRESSION(S) / ED DIAGNOSES  Final diagnoses:  Fall, initial encounter  Dislocation of left elbow, initial encounter     Note: This dictation was prepared with Dragon dictation. Any transcriptional errors that result from this process are unintentional     Nance Pear, MD 01/07/17 1746

## 2017-01-06 NOTE — ED Notes (Signed)
Spouse number [phone]

## 2017-01-06 NOTE — ED Notes (Signed)
Pt vomited x1.  

## 2017-01-06 NOTE — ED Notes (Signed)
Pt assisted back to bed after using toilet and pt began to vomit - Dr Archie Balboa was at bedside - see new orders

## 2017-01-06 NOTE — ED Notes (Signed)
Pt reports that she fell while walking the dog - she attempted to catch herself and landed on her left arm/elbow - there is an obvious deformity to left elbow - pt is also c/o pain to right hip - Dr Archie Balboa has assessed pt

## 2017-01-06 NOTE — ED Notes (Signed)
Called pharmacy to send ketamine - talked to [facility]

## 2017-01-06 NOTE — ED Notes (Signed)
Pharmacy emailed to send ketamine

## 2017-01-06 NOTE — ED Notes (Signed)
Pt assisted to toilet to void

## 2017-01-06 NOTE — ED Notes (Signed)
Reviewed d/c instructions, follow-up care, prescription, use of ice/elevation, sling use with patient. Patient verbalized understanding.

## 2017-01-06 NOTE — ED Notes (Signed)
Pharmacy called to send ketamine

## 2017-01-06 NOTE — ED Triage Notes (Signed)
Patient is complaining of left arm pain with deformity post fall.  Patient states she was walking her dog who went after a cat and pulled patient down.

## 2017-01-06 NOTE — ED Notes (Signed)
Pt assisted to toilet to have BM

## 2017-03-10 DIAGNOSIS — K909 Intestinal malabsorption, unspecified: Secondary | ICD-10-CM | POA: Insufficient documentation

## 2017-03-19 ENCOUNTER — Other Ambulatory Visit: Payer: Self-pay

## 2017-03-19 ENCOUNTER — Emergency Department
Admission: EM | Admit: 2017-03-19 | Discharge: 2017-03-19 | Disposition: A | Discharge status: Home / Self Care | Payer: Medicare Other | Attending: Emergency Medicine | Admitting: Emergency Medicine

## 2017-03-19 ENCOUNTER — Emergency Department: Payer: Medicare Other

## 2017-03-19 DIAGNOSIS — R0789 Other chest pain: Secondary | ICD-10-CM | POA: Diagnosis not present

## 2017-03-19 DIAGNOSIS — E119 Type 2 diabetes mellitus without complications: Secondary | ICD-10-CM | POA: Diagnosis not present

## 2017-03-19 DIAGNOSIS — Z91040 Latex allergy status: Secondary | ICD-10-CM | POA: Insufficient documentation

## 2017-03-19 DIAGNOSIS — I10 Essential (primary) hypertension: Secondary | ICD-10-CM

## 2017-03-19 DIAGNOSIS — R079 Chest pain, unspecified: Secondary | ICD-10-CM | POA: Diagnosis present

## 2017-03-19 DIAGNOSIS — Z87891 Personal history of nicotine dependence: Secondary | ICD-10-CM | POA: Insufficient documentation

## 2017-03-19 LAB — COMPREHENSIVE METABOLIC PANEL
ALT: 42 U/L (ref 14–54)
AST: 35 U/L (ref 15–41)
Albumin: 3.9 g/dL (ref 3.5–5.0)
Alkaline Phosphatase: 159 U/L — ABNORMAL HIGH (ref 38–126)
Anion gap: 8 (ref 5–15)
BUN: 12 mg/dL (ref 6–20)
CO2: 28 mmol/L (ref 22–32)
Calcium: 8 mg/dL — ABNORMAL LOW (ref 8.9–10.3)
Chloride: 102 mmol/L (ref 101–111)
Creatinine, Ser: 0.72 mg/dL (ref 0.44–1.00)
GFR calc Af Amer: >60 mL/min (ref >60)
GFR calc non Af Amer: >60 mL/min (ref >60)
Glucose, Bld: 303 mg/dL — ABNORMAL HIGH (ref 65–99)
Potassium: 3.1 mmol/L — ABNORMAL LOW (ref 3.5–5.1)
Sodium: 138 mmol/L (ref 135–145)
Total Bilirubin: 4.2 mg/dL — ABNORMAL HIGH (ref 0.3–1.2)
Total Protein: 6.9 g/dL (ref 6.5–8.1)

## 2017-03-19 LAB — LIPASE, BLOOD: Lipase: 19 U/L (ref 11–51)

## 2017-03-19 LAB — CBC WITH DIFFERENTIAL/PLATELET
Basophils Absolute: 0 10*3/uL (ref 0–0.1)
Basophils Relative: 1 %
Eosinophils Absolute: 0.3 10*3/uL (ref 0–0.7)
Eosinophils Relative: 4 %
HCT: 38.6 % (ref 35.0–47.0)
Hemoglobin: 13.7 g/dL (ref 12.0–16.0)
Lymphocytes Relative: 35 %
Lymphs Abs: 2.3 10*3/uL (ref 1.0–3.6)
MCH: 32.4 pg (ref 26.0–34.0)
MCHC: 35.5 g/dL (ref 32.0–36.0)
MCV: 91.3 fL (ref 80.0–100.0)
Monocytes Absolute: 0.3 10*3/uL (ref 0.2–0.9)
Monocytes Relative: 5 %
Neutro Abs: 3.6 10*3/uL (ref 1.4–6.5)
Neutrophils Relative %: 55 %
Platelets: 134 10*3/uL — ABNORMAL LOW (ref 150–440)
RBC: 4.22 MIL/uL (ref 3.80–5.20)
RDW: 13.1 % (ref 11.5–14.5)
WBC: 6.6 10*3/uL (ref 3.6–11.0)

## 2017-03-19 LAB — TROPONIN I: Troponin I: <0.03 ng/mL (ref <0.03)

## 2017-03-19 MED ORDER — DIAZEPAM 5 MG PO TABS
10.0000 mg | ORAL_TABLET | Freq: Once | ORAL | Status: AC
  Administered 2017-03-19: 10 mg via ORAL
  Filled 2017-03-19: qty 2

## 2017-03-19 MED ORDER — CLONIDINE HCL 0.1 MG PO TABS
0.1000 mg | ORAL_TABLET | Freq: Once | ORAL | Status: AC
  Administered 2017-03-19: 0.1 mg via ORAL
  Filled 2017-03-19: qty 1

## 2017-03-19 NOTE — ED Notes (Signed)
Pt returned from xray

## 2017-03-19 NOTE — ED Notes (Signed)
Per Dr. Jimmye Norman no IV, just butterfly for blood work.

## 2017-03-19 NOTE — ED Provider Notes (Signed)
[facility] Emergency Department Provider Note       Time seen: ----------------------------------------- 7:16 PM on 03/19/2017 -----------------------------------------   I have reviewed the triage vital signs and the nursing notes.  HISTORY   Chief Complaint Chest Pain and Hypertension    HPI Amber Christensen is a 52 y.o. female with a history of diabetes, GERD, hypertension and pancreatitis who presents to the ED for hypertension and chest pain.  Patient reportedly has type 1 diabetes with an insulin pump.  She saw her primary care doctor recently who added a new blood pressure medication.  She is having left-sided chest pain that radiates into her back.  She reports a history of some of this pain in the past.  She denies fevers, chills or other complaints.  Pain is 7 out of 10 on the left side of her chest.  Past Medical History:  Diagnosis Date  . Diabetes mellitus without complication ([place])   . GERD (gastroesophageal reflux disease)   . Hypertension   . Liver disease   . Pancreatitis     Patient Active Problem List   Diagnosis Date Noted  . Vasculitis ([place]) 08/03/2016  . Acute maculopapular rash 04/21/2016  . Maculopapular rash, generalized 04/20/2016  . Unstable angina ([place]) 09/28/2015    Past Surgical History:  Procedure Laterality Date  . ABDOMINAL HYSTERECTOMY    . APPENDECTOMY    . BLADDER SURGERY    . BOWEL RESECTION    . BREAST BIOPSY Left 1989  . CHOLECYSTECTOMY    . CYST REMOVAL HAND Left 1992   Ganglion cyst removed from Left Wrist  . DILATATION & CURETTAGE/HYSTEROSCOPY WITH MYOSURE    . KNEE ARTHROSCOPY Left 1992  . PANCREAS SURGERY    . REPAIR OF ESOPHAGUS  2012   3 clamps placed in esophagus    Allergies Morphine and related; Latex; Demerol [meperidine]; Dilaudid [hydromorphone hcl]; Darvon [propoxyphene]; Flexeril [cyclobenzaprine]; Levaquin [levofloxacin in d5w]; Soma [carisoprodol]; and Zofran Alvis Lemmings  hcl]  Social History Social History   Tobacco Use  . Smoking status: Former Smoker    Types: Cigarettes  . Smokeless tobacco: Former Network engineer Use Topics  . Alcohol use: No  . Drug use: No    Review of Systems Constitutional: Negative for fever. Cardiovascular: Positive for chest pain Respiratory: Negative for shortness of breath. Gastrointestinal: Negative for abdominal pain, vomiting and diarrhea. Musculoskeletal: Positive for back pain Skin: Negative for rash. Neurological: Negative for headaches, focal weakness or numbness.  All systems negative/normal/unremarkable except as stated in the HPI  ____________________________________________   PHYSICAL EXAM:  VITAL SIGNS: ED Triage Vitals [03/19/17 1912]  Enc Vitals Group     BP (!) 212/118     Pulse Rate 88     Resp 16     Temp 98.1 F (36.7 C)     Temp Source Oral     SpO2 98 %     Weight      Height      Head Circumference      Peak Flow      Pain Score 7     Pain Loc      Pain Edu?      Excl. in [place]?    Constitutional: Alert and oriented. Well appearing and in no distress. Eyes: Conjunctivae are normal. Normal extraocular movements. ENT   Head: Normocephalic and atraumatic.   Nose: No congestion/rhinnorhea.   Mouth/Throat: Mucous membranes are moist.   Neck: No stridor. Cardiovascular: Normal rate, regular rhythm.  No murmurs, rubs, or gallops. Respiratory: Normal respiratory effort without tachypnea nor retractions. Breath sounds are clear and equal bilaterally. No wheezes/rales/rhonchi. Gastrointestinal: Soft and nontender. Normal bowel sounds Musculoskeletal: Nontender with normal range of motion in extremities. No lower extremity tenderness nor edema. Neurologic:  Normal speech and language. No gross focal neurologic deficits are appreciated.  Skin:  Skin is warm, dry and intact. No rash noted. Psychiatric: Mood and affect are normal. Speech and behavior are normal.   ____________________________________________  EKG: Interpreted by me.  Sinus rhythm rate 83 bpm, possible LVH, normal axis, normal QT.  ____________________________________________  ED COURSE:  As part of my medical decision making, I reviewed the following data within the [place] History obtained from family if available, nursing notes, old chart and ekg, as well as notes from prior ED visits. Patient presented for chest pain as well as concerns for hypertension, we will assess with labs and imaging as indicated at this time.   Procedures ____________________________________________   LABS (pertinent positives/negatives)  Labs Reviewed  CBC WITH DIFFERENTIAL/PLATELET - Abnormal; Notable for the following components:      Result Value   Platelets 134 (*)    All other components within normal limits  COMPREHENSIVE METABOLIC PANEL - Abnormal; Notable for the following components:   Potassium 3.1 (*)    Glucose, Bld 303 (*)    Calcium 8.0 (*)    Alkaline Phosphatase 159 (*)    Total Bilirubin 4.2 (*)    All other components within normal limits  TROPONIN I  LIPASE, BLOOD    RADIOLOGY Images were viewed by me  Chest x-ray Is unremarkable ____________________________________________  DIFFERENTIAL DIAGNOSIS   Unstable angina, PE, dissection, pneumothorax, musculoskeletal pain, anxiety  FINAL ASSESSMENT AND PLAN  Chest pain, hypertension   Plan: Patient had presented for chest pain and hypertension. Patient's labs are unremarkable other than hyperglycemia. Patient's imaging are also negative.  Here her workup is been negative.  She was given Valium for anxiety and a dose of clonidine for high blood pressure.  Overall she appears stable for outpatient follow-up.   Laurence Aly, MD   Note: This note was generated in part or whole with voice recognition software. Voice recognition is usually quite accurate but there are transcription errors  that can and very often do occur. I apologize for any typographical errors that were not detected and corrected.     Earleen Newport, MD 03/19/17 2005

## 2017-03-19 NOTE — ED Triage Notes (Signed)
Pt arrives [facility] for HTN and CP. Pt type 1 DM with insulin pump. Saw PCP recently and added a new BP medication. States L breast CP into the back. Alert, oriented, no distress noted.

## 2017-03-19 NOTE — ED Notes (Signed)
Pt calling for ride

## 2017-03-19 NOTE — ED Notes (Signed)
Pt taken to xray 

## 2017-03-21 ENCOUNTER — Encounter (INDEPENDENT_AMBULATORY_CARE_PROVIDER_SITE_OTHER): Payer: Self-pay | Admitting: Vascular Surgery

## 2017-03-21 ENCOUNTER — Encounter (INDEPENDENT_AMBULATORY_CARE_PROVIDER_SITE_OTHER): Payer: Self-pay

## 2017-03-21 ENCOUNTER — Ambulatory Visit (INDEPENDENT_AMBULATORY_CARE_PROVIDER_SITE_OTHER): Payer: Medicare Other | Admitting: Vascular Surgery

## 2017-03-21 VITALS — BP 157/95 | HR 91 | Resp 18 | Ht 63.0 in | Wt 132.0 lb

## 2017-03-21 DIAGNOSIS — I878 Other specified disorders of veins: Secondary | ICD-10-CM

## 2017-03-21 DIAGNOSIS — K861 Other chronic pancreatitis: Secondary | ICD-10-CM | POA: Diagnosis not present

## 2017-03-21 DIAGNOSIS — E119 Type 2 diabetes mellitus without complications: Secondary | ICD-10-CM | POA: Diagnosis not present

## 2017-03-21 DIAGNOSIS — I10 Essential (primary) hypertension: Secondary | ICD-10-CM

## 2017-03-21 DIAGNOSIS — I200 Unstable angina: Secondary | ICD-10-CM

## 2017-03-21 DIAGNOSIS — K859 Acute pancreatitis without necrosis or infection, unspecified: Secondary | ICD-10-CM | POA: Insufficient documentation

## 2017-03-21 NOTE — Assessment & Plan Note (Signed)
Now requiring an insulin pump.  Has issues with chronic abdominal pain.

## 2017-03-21 NOTE — Assessment & Plan Note (Signed)
Poorly controlled on multiple medications

## 2017-03-21 NOTE — Assessment & Plan Note (Signed)
blood glucose control important in reducing the progression of atherosclerotic disease. Also, involved in wound healing. On appropriate medications.  

## 2017-03-21 NOTE — Assessment & Plan Note (Signed)
The patient has extremely limited venous access with multiple medical issues requiring frequent lab work and IVs.  Given this, I think a Port-A-Cath is a very reasonable option.  Risks and benefits of Port-A-Cath placement were discussed with the patient and she is agreeable to proceed.

## 2017-03-21 NOTE — Patient Instructions (Signed)
Implanted Port Insertion, Care After °This sheet gives you information about how to care for yourself after your procedure. Your health care provider may also give you more specific instructions. If you have problems or questions, contact your health care provider. °What can I expect after the procedure? °After your procedure, it is common to have: °· Discomfort at the port insertion site. °· Bruising on the skin over the port. This should improve over 3-4 days. ° °Follow these instructions at home: °Port care °· After your port is placed, you will get a manufacturer's information card. The card has information about your port. Keep this card with you at all times. °· Take care of the port as told by your health care provider. Ask your health care provider if you or a family member can get training for taking care of the port at home. A home health care nurse may also take care of the port. °· Make sure to remember what type of port you have. °Incision care °· Follow instructions from your health care provider about how to take care of your port insertion site. Make sure you: °? Wash your hands with soap and water before you change your bandage (dressing). If soap and water are not available, use hand sanitizer. °? Change your dressing as told by your health care provider. °? Leave stitches (sutures), skin glue, or adhesive strips in place. These skin closures may need to stay in place for 2 weeks or longer. If adhesive strip edges start to loosen and curl up, you may trim the loose edges. Do not remove adhesive strips completely unless your health care provider tells you to do that. °· Check your port insertion site every day for signs of infection. Check for: °? More redness, swelling, or pain. °? More fluid or blood. °? Warmth. °? Pus or a bad smell. °General instructions °· Do not take baths, swim, or use a hot tub until your health care provider approves. °· Do not lift anything that is heavier than 10 lb (4.5  kg) for a week, or as told by your health care provider. °· Ask your health care provider when it is okay to: °? Return to work or school. °? Resume usual physical activities or sports. °· Do not drive for 24 hours if you were given a medicine to help you relax (sedative). °· Take over-the-counter and prescription medicines only as told by your health care provider. °· Wear a medical alert bracelet in case of an emergency. This will tell any health care providers that you have a port. °· Keep all follow-up visits as told by your health care provider. This is important. °Contact a health care provider if: °· You cannot flush your port with saline as directed, or you cannot draw blood from the port. °· You have a fever or chills. °· You have more redness, swelling, or pain around your port insertion site. °· You have more fluid or blood coming from your port insertion site. °· Your port insertion site feels warm to the touch. °· You have pus or a bad smell coming from the port insertion site. °Get help right away if: °· You have chest pain or shortness of breath. °· You have bleeding from your port that you cannot control. °Summary °· Take care of the port as told by your health care provider. °· Change your dressing as told by your health care provider. °· Keep all follow-up visits as told by your health care provider. °  This information is not intended to replace advice given to you by your health care provider. Make sure you discuss any questions you have with your health care provider. °Document Released: 11/21/2012 Document Revised: 12/23/2015 Document Reviewed: 12/23/2015 °Elsevier Interactive Patient Education © 2017 [employer] ° °

## 2017-03-21 NOTE — Assessment & Plan Note (Signed)
Sees her PCP and a cardiologist

## 2017-03-21 NOTE — Progress Notes (Signed)
Patient ID: Amber Christensen, female   DOB: 1965/08/27, 52 y.o.   MRN: [id number]  Chief Complaint  Patient presents with  . New Patient (Initial Visit)    eval for new vascular access    HPI Amber Christensen is a 52 y.o. female.  I am asked to see the patient by Dr. Candiss Norse for evaluation of venous access.  The patient reports multiple issues with obtaining venous access in the past.  She apparently had a dislocated elbow several weeks ago and her surgery was complicated by lack of venous access.  She says they have no more peripheral IVs that they can get.  Apparently they tried to put in central lines at the last procedure with great difficulty as well.  She has a history of severe pancreatitis and is now an insulin-dependent diabetic.  She also has a history of liver disease.  No fevers or chills.  She is familiar with Port-A-Cath because her mother had one.  She has never had a long-term venous device other than a PICC line.   Past Medical History:  Diagnosis Date  . Diabetes mellitus without complication ([place])   . GERD (gastroesophageal reflux disease)   . Hypertension   . Liver disease   . Pancreatitis     Past Surgical History:  Procedure Laterality Date  . ABDOMINAL HYSTERECTOMY    . APPENDECTOMY    . BLADDER SURGERY    . BOWEL RESECTION    . BREAST BIOPSY Left 1989  . CHOLECYSTECTOMY    . CYST REMOVAL HAND Left 1992   Ganglion cyst removed from Left Wrist  . DILATATION & CURETTAGE/HYSTEROSCOPY WITH MYOSURE    . KNEE ARTHROSCOPY Left 1992  . PANCREAS SURGERY    . REPAIR OF ESOPHAGUS  2012   3 clamps placed in esophagus    Family History  Problem Relation Age of Onset  . Cirrhosis Mother   . Colon cancer Mother   No bleeding disorders, clotting disorders, or aneurysms  Social History Social History   Tobacco Use  . Smoking status: Former Smoker    Types: Cigarettes  . Smokeless tobacco: Former Network engineer Use Topics  . Alcohol use: No  . Drug use: No      Allergies  Allergen Reactions  . Morphine And Related Anaphylaxis    Pt states that this medication causes cardiac arrest.    . Latex Rash  . Demerol [Meperidine] Hives    Pt states that this medication causes cardiac arrest.    . Dilaudid [Hydromorphone Hcl] Nausea And Vomiting    Pt states that this medication causes cardiac arrest.    . Darvon [Propoxyphene] Nausea And Vomiting and Rash  . Flexeril [Cyclobenzaprine] Nausea And Vomiting and Rash  . Levaquin [Levofloxacin In D5w] Nausea And Vomiting and Rash  . Soma [Carisoprodol] Nausea And Vomiting and Rash  . Zofran [Ondansetron Hcl] Nausea And Vomiting and Rash    Current Outpatient Medications  Medication Sig Dispense Refill  . aspirin EC 81 MG tablet Take 81 mg by mouth daily.    Marland Kitchen CALCIUM-MAGNESIUM-ZINC PO Take 1 tablet by mouth daily.    . Cholecalciferol (D3-1000 PO) Take 1,000 Units by mouth daily.    Marland Kitchen dicyclomine (BENTYL) 20 MG tablet Take 1 tablet (20 mg total) by mouth 3 (three) times daily as needed for spasms. 30 tablet 0  . NOVOLOG 100 UNIT/ML injection 40 Units by Other route daily. Use up to 40 units daily via insulin pump.    Marland Kitchen  omeprazole (PRILOSEC) 40 MG capsule Take 1 capsule (40 mg total) by mouth daily. 30 capsule 0  . oxyCODONE (OXY IR/ROXICODONE) 5 MG immediate release tablet Take 1 tablet (5 mg total) by mouth every 6 (six) hours as needed for severe pain. 6 tablet 0  . oxyCODONE (ROXICODONE) 5 MG immediate release tablet Take 1 tablet (5 mg total) by mouth every 8 (eight) hours as needed. 20 tablet 0  . pantoprazole (PROTONIX) 40 MG tablet Take 1 tablet (40 mg total) by mouth 2 (two) times daily. 60 tablet 1  . potassium chloride SA (K-DUR,KLOR-CON) 20 MEQ tablet Take 1 tablet (20 mEq total) by mouth 2 (two) times daily. 14 tablet 0  . potassium gluconate 595 (99 K) MG TABS tablet Take 595 mg by mouth daily.    . vitamin E 400 UNIT capsule Take 400 Units by mouth daily.    . ciprofloxacin (CIPRO) 500  MG tablet Take 1 tablet (500 mg total) by mouth 2 (two) times daily. (Patient not taking: Reported on 11/02/2016) 20 tablet 0  . colchicine 0.6 MG tablet Take 1 tablet (0.6 mg total) by mouth daily. (Patient not taking: Reported on 11/02/2016) 30 tablet 0  . insulin lispro (HUMALOG) 100 UNIT/ML injection Inject 0.04 mLs (4 Units total) into the skin 3 (three) times daily with meals. Pt uses based off the amount of carbs she eats at each meal. (Patient not taking: Reported on 11/02/2016) 10 mL 11  . loratadine (CLARITIN) 10 MG tablet Take 1 tablet (10 mg total) by mouth daily. (Patient not taking: Reported on 11/02/2016) 30 tablet 0  . metroNIDAZOLE (FLAGYL) 500 MG tablet Take 1 tablet (500 mg total) by mouth 3 (three) times daily. (Patient not taking: Reported on 11/02/2016) 30 tablet 0  . predniSONE (DELTASONE) 5 MG tablet 7 tabs po day1; 6 tabs po day day2; 5 tabs po day3; 4 tabs po day4; 3 tabs po day5; 2 tabs po day6; 1 tab po day7 (Patient not taking: Reported on 11/02/2016) 28 tablet 0  . promethazine (PHENERGAN) 12.5 MG tablet Take 1 tablet (12.5 mg total) by mouth every 6 (six) hours as needed for nausea or vomiting. (Patient not taking: Reported on 11/02/2016) 12 tablet 0   No current facility-administered medications for this visit.       REVIEW OF SYSTEMS (Negative unless checked)  Constitutional: [] Weight loss  [] Fever  [] Chills Cardiac: [] Chest pain   [] Chest pressure   [] Palpitations   [] Shortness of breath when laying flat   [] Shortness of breath at rest   [] Shortness of breath with exertion. Vascular:  [] Pain in legs with walking   [] Pain in legs at rest   [] Pain in legs when laying flat   [] Claudication   [] Pain in feet when walking  [] Pain in feet at rest  [] Pain in feet when laying flat   [] History of DVT   [] Phlebitis   [] Swelling in legs   [] Varicose veins   [] Non-healing ulcers Pulmonary:   [] Uses home oxygen   [] Productive cough   [] Hemoptysis   [] Wheeze  [] COPD    [] Asthma Neurologic:  [] Dizziness  [] Blackouts   [] Seizures   [] History of stroke   [] History of TIA  [] Aphasia   [] Temporary blindness   [] Dysphagia   [] Weakness or numbness in arms   [] Weakness or numbness in legs Musculoskeletal:  [x] Arthritis   [] Joint swelling   [] Joint pain   [] Low back pain Hematologic:  [] Easy bruising  [] Easy bleeding   [] Hypercoagulable state   [  x]Anemic  [] Hepatitis Gastrointestinal:  [] Blood in stool   [] Vomiting blood  [] Gastroesophageal reflux/heartburn   [x] Abdominal pain Genitourinary:  [] Chronic kidney disease   [] Difficult urination  [] Frequent urination  [] Burning with urination   [] Hematuria Skin:  [] Rashes   [] Ulcers   [] Wounds Psychological:  [] History of anxiety   []  History of major depression.    Physical Exam BP (!) 157/95 (BP Location: Right Arm)   Pulse 91   Resp 18   Ht 5' 3"  (1.6 m)   Wt 59.9 kg (132 lb)   BMI 23.38 kg/m  Gen:  WD/WN, NAD Head: [place]/AT, No temporalis wasting.  Ear/Nose/Throat: Hearing grossly intact, nares w/o erythema or drainage, oropharynx w/o Erythema/Exudate Eyes: Conjunctiva clear, sclera non-icteric  Neck: trachea midline.  No JVD.  Pulmonary:  Good air movement, clear to auscultation bilaterally.  Cardiac: RRR, no JVD Vascular:  Vessel Right Left  Radial Palpable Palpable                                   Gastrointestinal: soft, non-tender/non-distended. Musculoskeletal: M/S 5/5 throughout.  Extremities without ischemic changes.  No deformity or atrophy. No LE edema. Neurologic: Sensation grossly intact in extremities.  Symmetrical.  Speech is fluent. Motor exam as listed above. Psychiatric: Judgment intact, Mood & affect appropriate for pt's clinical situation. Dermatologic: No rashes or ulcers noted.  No cellulitis or open wounds.  Radiology Dg Chest 2 View  Result Date: 03/19/2017 CLINICAL DATA:  Chest pain EXAM: CHEST  2 VIEW COMPARISON:  09/01/2016 FINDINGS: The heart size and mediastinal contours  are within normal limits. Both lungs are clear. The visualized skeletal structures are unremarkable. IMPRESSION: No active cardiopulmonary disease. Electronically Signed   By: Donavan Foil M.D.   On: 03/19/2017 20:34    Labs Recent Results (from the past 2160 hour(s))  CBC with Differential/Platelet     Status: Abnormal   Collection Time: 03/19/17  7:22 PM  Result Value Ref Range   WBC 6.6 3.6 - 11.0 K/uL   RBC 4.22 3.80 - 5.20 MIL/uL   Hemoglobin 13.7 12.0 - 16.0 g/dL   HCT 38.6 35.0 - 47.0 %   MCV 91.3 80.0 - 100.0 fL   MCH 32.4 26.0 - 34.0 pg   MCHC 35.5 32.0 - 36.0 g/dL   RDW 13.1 11.5 - 14.5 %   Platelets 134 (L) 150 - 440 K/uL   Neutrophils Relative % 55 %   Neutro Abs 3.6 1.4 - 6.5 K/uL   Lymphocytes Relative 35 %   Lymphs Abs 2.3 1.0 - 3.6 K/uL   Monocytes Relative 5 %   Monocytes Absolute 0.3 0.2 - 0.9 K/uL   Eosinophils Relative 4 %   Eosinophils Absolute 0.3 0 - 0.7 K/uL   Basophils Relative 1 %   Basophils Absolute 0.0 0 - 0.1 K/uL    Comment: Performed at [facility], [address] [place], [place] [address]  Comprehensive metabolic panel     Status: Abnormal   Collection Time: 03/19/17  7:22 PM  Result Value Ref Range   Sodium 138 135 - 145 mmol/L   Potassium 3.1 (L) 3.5 - 5.1 mmol/L   Chloride 102 101 - 111 mmol/L   CO2 28 22 - 32 mmol/L   Glucose, Bld 303 (H) 65 - 99 mg/dL   BUN 12 6 - 20 mg/dL   Creatinine, Ser 0.72 0.44 - 1.00 mg/dL   Calcium  8.0 (L) 8.9 - 10.3 mg/dL   Total Protein 6.9 6.5 - 8.1 g/dL   Albumin 3.9 3.5 - 5.0 g/dL   AST 35 15 - 41 U/L   ALT 42 14 - 54 U/L   Alkaline Phosphatase 159 (H) 38 - 126 U/L   Total Bilirubin 4.2 (H) 0.3 - 1.2 mg/dL   GFR calc non Af Amer >60 >60 mL/min   GFR calc Af Amer >60 >60 mL/min    Comment: (NOTE) The eGFR has been calculated using the CKD EPI equation. This calculation has not been validated in all clinical situations. eGFR's persistently <60 mL/min signify possible Chronic  Kidney Disease.    Anion gap 8 5 - 15    Comment: Performed at [facility], [address] [place], [place] [address]  Troponin I     Status: None   Collection Time: 03/19/17  7:22 PM  Result Value Ref Range   Troponin I <0.03 <0.03 ng/mL    Comment: Performed at [facility], [address] [place], [place] [address]  Lipase, blood     Status: None   Collection Time: 03/19/17  7:22 PM  Result Value Ref Range   Lipase 19 11 - 51 U/L    Comment: Performed at [facility], [address] [place], [place] [address]    Assessment/Plan:  Unstable angina [facility]) Sees her PCP and a cardiologist  Hypertension Poorly controlled on multiple medications  Diabetes mellitus without complication ([place]) blood glucose control important in reducing the progression of atherosclerotic disease. Also, involved in wound healing. On appropriate medications.   Pancreatitis Now requiring an insulin pump.  Has issues with chronic abdominal pain.  Poor venous access The patient has extremely limited venous access with multiple medical issues requiring frequent lab work and IVs.  Given this, I think a Port-A-Cath is a very reasonable option.  Risks and benefits of Port-A-Cath placement were discussed with the patient and she is agreeable to proceed.      Leotis Pain 03/21/2017, 3:34 PM   This note was created with Dragon medical transcription system.  Any errors from dictation are unintentional.

## 2017-04-03 ENCOUNTER — Other Ambulatory Visit (INDEPENDENT_AMBULATORY_CARE_PROVIDER_SITE_OTHER): Payer: Self-pay | Admitting: Vascular Surgery

## 2017-04-09 MED ORDER — CEFAZOLIN SODIUM-DEXTROSE 2-4 GM/100ML-% IV SOLN
2.0000 g | Freq: Once | INTRAVENOUS | Status: AC
  Administered 2017-04-10: 2 g via INTRAVENOUS

## 2017-04-10 ENCOUNTER — Ambulatory Visit
Admission: RE | Admit: 2017-04-10 | Discharge: 2017-04-10 | Disposition: A | Discharge status: Home / Self Care | Payer: Medicare Other | Source: Ambulatory Visit | Attending: Vascular Surgery | Admitting: Vascular Surgery

## 2017-04-10 ENCOUNTER — Encounter
Admission: RE | Disposition: A | Discharge status: Home / Self Care | Payer: Self-pay | Source: Ambulatory Visit | Attending: Vascular Surgery

## 2017-04-10 DIAGNOSIS — I10 Essential (primary) hypertension: Secondary | ICD-10-CM | POA: Diagnosis not present

## 2017-04-10 DIAGNOSIS — Z87891 Personal history of nicotine dependence: Secondary | ICD-10-CM | POA: Insufficient documentation

## 2017-04-10 DIAGNOSIS — E119 Type 2 diabetes mellitus without complications: Secondary | ICD-10-CM | POA: Diagnosis not present

## 2017-04-10 DIAGNOSIS — Z7982 Long term (current) use of aspirin: Secondary | ICD-10-CM | POA: Diagnosis not present

## 2017-04-10 DIAGNOSIS — K861 Other chronic pancreatitis: Secondary | ICD-10-CM | POA: Diagnosis present

## 2017-04-10 DIAGNOSIS — I200 Unstable angina: Secondary | ICD-10-CM | POA: Diagnosis not present

## 2017-04-10 DIAGNOSIS — K219 Gastro-esophageal reflux disease without esophagitis: Secondary | ICD-10-CM | POA: Diagnosis not present

## 2017-04-10 DIAGNOSIS — Z888 Allergy status to other drugs, medicaments and biological substances status: Secondary | ICD-10-CM | POA: Diagnosis not present

## 2017-04-10 DIAGNOSIS — Z881 Allergy status to other antibiotic agents status: Secondary | ICD-10-CM | POA: Diagnosis not present

## 2017-04-10 DIAGNOSIS — Z8379 Family history of other diseases of the digestive system: Secondary | ICD-10-CM | POA: Diagnosis not present

## 2017-04-10 DIAGNOSIS — Z91040 Latex allergy status: Secondary | ICD-10-CM | POA: Insufficient documentation

## 2017-04-10 DIAGNOSIS — Z800 Family history of malignant neoplasm of digestive organs: Secondary | ICD-10-CM | POA: Insufficient documentation

## 2017-04-10 DIAGNOSIS — Z90710 Acquired absence of both cervix and uterus: Secondary | ICD-10-CM | POA: Diagnosis not present

## 2017-04-10 DIAGNOSIS — Z79899 Other long term (current) drug therapy: Secondary | ICD-10-CM | POA: Insufficient documentation

## 2017-04-10 DIAGNOSIS — Z9889 Other specified postprocedural states: Secondary | ICD-10-CM | POA: Insufficient documentation

## 2017-04-10 DIAGNOSIS — K769 Liver disease, unspecified: Secondary | ICD-10-CM | POA: Diagnosis not present

## 2017-04-10 DIAGNOSIS — Z9049 Acquired absence of other specified parts of digestive tract: Secondary | ICD-10-CM | POA: Diagnosis not present

## 2017-04-10 DIAGNOSIS — I878 Other specified disorders of veins: Secondary | ICD-10-CM | POA: Diagnosis present

## 2017-04-10 DIAGNOSIS — Z794 Long term (current) use of insulin: Secondary | ICD-10-CM | POA: Diagnosis not present

## 2017-04-10 DIAGNOSIS — Z885 Allergy status to narcotic agent status: Secondary | ICD-10-CM | POA: Insufficient documentation

## 2017-04-10 HISTORY — PX: PORTA CATH INSERTION: CATH118285

## 2017-04-10 LAB — GLUCOSE, CAPILLARY: Glucose-Capillary: 150 mg/dL — ABNORMAL HIGH (ref 65–99)

## 2017-04-10 SURGERY — PORTA CATH INSERTION
Anesthesia: Moderate Sedation

## 2017-04-10 MED ORDER — MIDAZOLAM HCL 5 MG/5ML IJ SOLN
INTRAMUSCULAR | Status: AC
  Filled 2017-04-10: qty 5

## 2017-04-10 MED ORDER — SODIUM CHLORIDE 0.9 % IR SOLN
Freq: Once | Status: DC
  Filled 2017-04-10: qty 2

## 2017-04-10 MED ORDER — MIDAZOLAM HCL 2 MG/2ML IJ SOLN
INTRAMUSCULAR | Status: AC
  Filled 2017-04-10: qty 2

## 2017-04-10 MED ORDER — FENTANYL CITRATE (PF) 100 MCG/2ML IJ SOLN
INTRAMUSCULAR | Status: DC | PRN
  Administered 2017-04-10: 25 ug via INTRAVENOUS
  Administered 2017-04-10: 50 ug via INTRAVENOUS
  Administered 2017-04-10: 25 ug via INTRAVENOUS
  Administered 2017-04-10 (×2): 50 ug via INTRAVENOUS

## 2017-04-10 MED ORDER — HEPARIN (PORCINE) IN NACL 2-0.9 UNIT/ML-% IJ SOLN
INTRAMUSCULAR | Status: AC
  Filled 2017-04-10: qty 500

## 2017-04-10 MED ORDER — MIDAZOLAM HCL 2 MG/2ML IJ SOLN
INTRAMUSCULAR | Status: DC | PRN
  Administered 2017-04-10 (×2): 1 mg via INTRAVENOUS
  Administered 2017-04-10: 2 mg via INTRAVENOUS
  Administered 2017-04-10 (×2): 1 mg via INTRAVENOUS

## 2017-04-10 MED ORDER — SODIUM CHLORIDE 0.9 % IV SOLN
INTRAVENOUS | Status: DC
Start: 2017-04-10 — End: 2017-04-10
  Administered 2017-04-10: 09:00:00 via INTRAVENOUS

## 2017-04-10 MED ORDER — FENTANYL CITRATE (PF) 100 MCG/2ML IJ SOLN
INTRAMUSCULAR | Status: AC
  Filled 2017-04-10: qty 2

## 2017-04-10 MED ORDER — LIDOCAINE-EPINEPHRINE (PF) 1 %-1:200000 IJ SOLN
INTRAMUSCULAR | Status: AC
  Filled 2017-04-10: qty 30

## 2017-04-10 SURGICAL SUPPLY — 11 items
COVER PROBE U/S 5X48 (MISCELLANEOUS) ×2 IMPLANT
ELECT REM PT RETURN 9FT ADLT (ELECTROSURGICAL) ×2
ELECTRODE REM PT RTRN 9FT ADLT (ELECTROSURGICAL) ×1 IMPLANT
KIT PORT POWER 8FR ISP CVUE (Miscellaneous) ×2 IMPLANT
PACK ANGIOGRAPHY (CUSTOM PROCEDURE TRAY) ×2 IMPLANT
PAD GROUND ADULT SPLIT (MISCELLANEOUS) ×2 IMPLANT
PENCIL ELECTRO HAND CTR (MISCELLANEOUS) ×2 IMPLANT
SUT MNCRL AB 4-0 PS2 18 (SUTURE) ×2 IMPLANT
SUT PROLENE 0 CT 1 30 (SUTURE) ×2 IMPLANT
SUTURE VIC 3-0 (SUTURE) ×2 IMPLANT
TOWEL OR 17X26 4PK STRL BLUE (TOWEL DISPOSABLE) ×2 IMPLANT

## 2017-04-10 NOTE — H&P (Signed)
[facility] History & Physical Update  The patient was interviewed and re-examined.  The patient's previous History and Physical has been reviewed and is unchanged.  There is no change in the plan of care. We plan to proceed with the scheduled procedure.  Leotis Pain, MD  04/10/2017, 8:23 AM

## 2017-04-10 NOTE — Op Note (Signed)
      [facility]       Operative Note  Date: 04/10/2017  Preoperative diagnosis:  1.  Chronic pancreatitis and multiple other medical issues with poor venous access  Postoperative diagnosis:  Same as above  Procedures: #1. Ultrasound guidance for vascular access to the right internal jugular vein. #2. Fluoroscopic guidance for placement of catheter. #3. Placement of CT compatible Port-A-Cath, right internal jugular vein.  Surgeon: Leotis Pain, MD.   Anesthesia: Local with moderate conscious sedation for approximately 15 minutes using 6 mg of Versed and 200 Mcg of Fentanyl  Fluoroscopy time: less than 1 minute  Contrast used: 0  Estimated blood loss: 5 cc  Indication for the procedure:  The patient is a 52 y.o.female with chronic pancreatitis, diabetes, and other medical issues with poor venous access.  The patient needs a Port-A-Cath for durable venous access, IV infusions, lab draws, and CT scans. We are asked to place this. Risks and benefits were discussed and informed consent was obtained.  Description of procedure: The patient was brought to the vascular and interventional radiology suite.  Moderate conscious sedation was administered throughout the procedure during a face to face encounter with the patient with my supervision of the RN administering medicines and monitoring the patient's vital signs, pulse oximetry, telemetry and mental status throughout from the start of the procedure until the patient was taken to the recovery room. The right neck chest and shoulder were sterilely prepped and draped, and a sterile surgical field was created. Ultrasound was used to help visualize a patent right internal jugular vein. This was then accessed under direct ultrasound guidance without difficulty with the Seldinger needle and a permanent image was recorded. A J-wire was placed. After skin nick and dilatation, the peel-away sheath was then placed over the wire. I then  anesthetized an area under the clavicle approximately 1-2 fingerbreadths. A transverse incision was created and an inferior pocket was created with electrocautery and blunt dissection. The port was then brought onto the field, placed into the pocket and secured to the chest wall with 2 Prolene sutures. The catheter was connected to the port and tunneled from the subclavicular incision to the access site. Fluoroscopic guidance was then used to cut the catheter to an appropriate length. The catheter was then placed through the peel-away sheath and the peel-away sheath was removed. The catheter tip was parked in excellent location under fluorocoscopic guidance in the cavoatrial junction. The pocket was then irrigated with antibiotic impregnated saline and the wound was closed with a running 3-0 Vicryl and a 4-0 Monocryl. The access incision was closed with a single 4-0 Monocryl. The Huber needle was used to withdraw blood and flush the port with heparinized saline. Dermabond was then placed as a dressing. The patient tolerated the procedure well and was taken to the recovery room in stable condition.   Leotis Pain 04/10/2017 9:54 AM   This note was created with Dragon Medical transcription system. Any errors in dictation are purely unintentional.

## 2017-04-10 NOTE — Progress Notes (Signed)
Patient has an insulin pump that self checks blood sugar glucose at this time is 213.

## 2017-04-11 ENCOUNTER — Encounter: Payer: Self-pay | Admitting: Vascular Surgery

## 2017-04-26 ENCOUNTER — Telehealth (INDEPENDENT_AMBULATORY_CARE_PROVIDER_SITE_OTHER): Payer: Self-pay

## 2017-04-26 NOTE — Telephone Encounter (Signed)
Patient called wanting to know who flushes a port-a-cath and I advised asking her PCP, usually I schedule port placements from the Cancer center and we don't take care of them or flush them.

## 2017-05-05 ENCOUNTER — Telehealth: Payer: Self-pay | Admitting: *Deleted

## 2017-05-05 NOTE — Telephone Encounter (Signed)
Scheduler Received incoming call from [facility] clinic regarding a referral for port flushes. Scheduling team was informed that patient had a painful port site and needed to be evaluated asap and also set up port flushes in the cancer center.  I reviewed chart and also spoke with Dr. Mike Gip. Port placed on 04/07/17 by Dr. Lucky Cowboy. If patient is having pain at port site, then the pt needs to be evaluated by the surgeon that placed the port.  I reached out to Devona Konig, [place] at Dr. Bunnie Domino office. Apparently, patient also contacted Dr. Bunnie Domino office on 04/26/17  for an apt for "port flushes." pt was informed at that phone call that Dr. Lucky Cowboy does not flush ports.  "Per Dr. Lucky Cowboy ports don't " swell."   I personally reached out to the patient. She states that her right chest wall port is "slightly tender" (described "as a dull ache"). there is "swelling in the skin near the shoulder above the port site."     We will only be able to flush her port and not manage any other issues. She would like the hematologist to "evaluate her port." I explained that if there is concerns about the port site or edema, she needs to contact Dr. Bunnie Domino office to further evaluate the edema above the port site.  I explained that she would not be due for a flush until a few more weeks. I explained that in order for the flush apts to occur that she would have to be established by the hematologist first. The patient was insisting that her port be flushed today. I told her that our procedure policy is to flush ports every 6-8 weeks and she would not be due to the flush.  She inquired if she could get IV antibiotics in the cancer center office if when she needs them. I explained that at this time, IV antibiotics are not needed. Home health would most likely be the provider to administer antibiotics if these are necessary.  She states she has not needed antibiotics in "months."  I reiterated that apts would only be for port flushes at this time.   She states that she has poor venous access and would likely needs labs on a frequent basis and wanted to know if she could come to get her labs today or tomorrow via port access.  I explained to her that she would have to established with a hematologist first before any labs could be ordered. Also the hematologist would also have to agree to have these labs drawn.  I told her that I would have my scheduler contact her on Monday to set up an apt in April to see the hematologist (as this is when she would be due for a port flush).

## 2017-05-23 ENCOUNTER — Encounter: Payer: Self-pay | Admitting: Emergency Medicine

## 2017-05-23 ENCOUNTER — Emergency Department
Admission: EM | Admit: 2017-05-23 | Discharge: 2017-05-23 | Disposition: A | Discharge status: Home / Self Care | Payer: Medicare Other | Attending: Emergency Medicine | Admitting: Emergency Medicine

## 2017-05-23 DIAGNOSIS — E119 Type 2 diabetes mellitus without complications: Secondary | ICD-10-CM | POA: Diagnosis not present

## 2017-05-23 DIAGNOSIS — R531 Weakness: Secondary | ICD-10-CM | POA: Diagnosis not present

## 2017-05-23 DIAGNOSIS — I10 Essential (primary) hypertension: Secondary | ICD-10-CM | POA: Insufficient documentation

## 2017-05-23 DIAGNOSIS — R609 Edema, unspecified: Secondary | ICD-10-CM | POA: Diagnosis present

## 2017-05-23 DIAGNOSIS — Z7984 Long term (current) use of oral hypoglycemic drugs: Secondary | ICD-10-CM | POA: Diagnosis not present

## 2017-05-23 DIAGNOSIS — Z7982 Long term (current) use of aspirin: Secondary | ICD-10-CM | POA: Insufficient documentation

## 2017-05-23 DIAGNOSIS — Z79899 Other long term (current) drug therapy: Secondary | ICD-10-CM | POA: Insufficient documentation

## 2017-05-23 LAB — PROTEIN / CREATININE RATIO, URINE
Creatinine, Urine: 77 mg/dL
Total Protein, Urine: <6 mg/dL

## 2017-05-23 LAB — CBC WITH DIFFERENTIAL/PLATELET
Basophils Absolute: 0 10*3/uL (ref 0–0.1)
Basophils Relative: 1 %
Eosinophils Absolute: 0.1 10*3/uL (ref 0–0.7)
Eosinophils Relative: 2 %
HCT: 38.3 % (ref 35.0–47.0)
Hemoglobin: 13.4 g/dL (ref 12.0–16.0)
Lymphocytes Relative: 39 %
Lymphs Abs: 2.3 10*3/uL (ref 1.0–3.6)
MCH: 30.7 pg (ref 26.0–34.0)
MCHC: 35.1 g/dL (ref 32.0–36.0)
MCV: 87.7 fL (ref 80.0–100.0)
Monocytes Absolute: 0.3 10*3/uL (ref 0.2–0.9)
Monocytes Relative: 5 %
Neutro Abs: 3.2 10*3/uL (ref 1.4–6.5)
Neutrophils Relative %: 53 %
Platelets: 165 10*3/uL (ref 150–440)
RBC: 4.37 MIL/uL (ref 3.80–5.20)
RDW: 13.4 % (ref 11.5–14.5)
WBC: 6 10*3/uL (ref 3.6–11.0)

## 2017-05-23 LAB — URINALYSIS, COMPLETE (UACMP) WITH MICROSCOPIC
Bacteria, UA: NONE SEEN
Bilirubin Urine: NEGATIVE
Glucose, UA: NEGATIVE mg/dL
Hgb urine dipstick: NEGATIVE
Ketones, ur: 5 mg/dL — AB
Leukocytes, UA: NEGATIVE
Nitrite: NEGATIVE
Protein, ur: NEGATIVE mg/dL
Specific Gravity, Urine: 1.013 (ref 1.005–1.030)
pH: 5 (ref 5.0–8.0)

## 2017-05-23 LAB — COMPREHENSIVE METABOLIC PANEL
ALT: 38 U/L (ref 14–54)
AST: 34 U/L (ref 15–41)
Albumin: 4.2 g/dL (ref 3.5–5.0)
Alkaline Phosphatase: 141 U/L — ABNORMAL HIGH (ref 38–126)
Anion gap: 7 (ref 5–15)
BUN: 11 mg/dL (ref 6–20)
CO2: 28 mmol/L (ref 22–32)
Calcium: 8 mg/dL — ABNORMAL LOW (ref 8.9–10.3)
Chloride: 102 mmol/L (ref 101–111)
Creatinine, Ser: 0.51 mg/dL (ref 0.44–1.00)
GFR calc Af Amer: >60 mL/min (ref >60)
GFR calc non Af Amer: >60 mL/min (ref >60)
Glucose, Bld: 158 mg/dL — ABNORMAL HIGH (ref 65–99)
Potassium: 2.6 mmol/L — CL (ref 3.5–5.1)
Sodium: 137 mmol/L (ref 135–145)
Total Bilirubin: 5.6 mg/dL — ABNORMAL HIGH (ref 0.3–1.2)
Total Protein: 6.9 g/dL (ref 6.5–8.1)

## 2017-05-23 LAB — C-REACTIVE PROTEIN: CRP: <0.8 mg/dL (ref <1.0)

## 2017-05-23 LAB — TROPONIN I: Troponin I: <0.03 ng/mL (ref <0.03)

## 2017-05-23 LAB — SEDIMENTATION RATE: Sed Rate: 7 mm/hr (ref 0–30)

## 2017-05-23 MED ORDER — HEPARIN SOD (PORK) LOCK FLUSH 10 UNIT/ML IV SOLN
INTRAVENOUS | Status: AC
  Administered 2017-05-23: 11:00:00
  Filled 2017-05-23: qty 1

## 2017-05-23 MED ORDER — HEPARIN SOD (PORK) LOCK FLUSH 100 UNIT/ML IV SOLN
INTRAVENOUS | Status: AC
  Filled 2017-05-23: qty 5

## 2017-05-23 NOTE — ED Notes (Signed)
Patient was sent due to lab cannot draw out patient labs from a port.

## 2017-05-23 NOTE — ED Notes (Signed)
Accessed port a cath with 22 g 3/4 inch kit.  Drew lab work.  Port flushed with 20 ml NS and then 5 ml heparin 10 u/ml (50 u total) and needle removed.  Site looks good.  Pt to get urine specimne.

## 2017-05-23 NOTE — ED Triage Notes (Signed)
Patient presents to the ED to have labs drawn from her port.  Patient states her PCP ordered lab work because she is having abdominal pain, nausea and swelling.  Patient states, "I just don't feel right and that's why I went to the doctor."  Patient has swelling to her lower legs.

## 2017-05-23 NOTE — ED Provider Notes (Addendum)
[facility] Emergency Department Provider Note  Time seen: 10:41 AM  I have reviewed the triage vital signs and the nursing notes.   HISTORY  Chief Complaint Labs Only    HPI Amber Christensen is a 52 y.o. female with a past medical history of diabetes, hypertension, vasculitis, presents to the emergency department for lab draw.  According to the patient she has been experiencing increased swelling in her arms and legs over the past 1 week.  States when she was initially diagnosed with vasculitis she also had similar swelling.  She spoke to her rheumatologist and primary care doctor, they have ordered blood work but the patient has very poor veins and they were unable to access for blood work.  Patient had a port placed in February, but they were unable to use the port as they did not know how to flush it so they sent the patient to the emergency department for blood draw.  Here the patient is alert and oriented, calm and cooperative, no distress.  She states she has been experiencing swelling especially across her lower extremities over the past 1-1.5 weeks, now with mild swelling in the upper extremities and somewhat in her abdomen.  Patient has a history of vasculitis, was started on a prednisone taper by her rheumatologist, which she states she will pick up today along with a fluid pill.  Rheumatologist sent the patient here for blood draw but they could not get lab work in the office.  Patient denies any abdominal pain, does state mild bloating.  Denies any chest pain or trouble breathing.  Largely negative review of systems otherwise.  Patient was sent with orders for lab work that they would like drawn.   Past Medical History:  Diagnosis Date  . Diabetes mellitus without complication ([place])   . GERD (gastroesophageal reflux disease)   . Hypertension   . Liver disease   . Pancreatitis     Patient Active Problem List   Diagnosis Date Noted  . Hypertension 03/21/2017   . Diabetes mellitus without complication ([name]) 19/62/2297  . Pancreatitis 03/21/2017  . Poor venous access 03/21/2017  . Vasculitis ([place]) 08/03/2016  . Acute maculopapular rash 04/21/2016  . Maculopapular rash, generalized 04/20/2016  . Unstable angina ([place]) 09/28/2015    Past Surgical History:  Procedure Laterality Date  . ABDOMINAL HYSTERECTOMY    . APPENDECTOMY    . BLADDER SURGERY    . BOWEL RESECTION    . BREAST BIOPSY Left 1989  . CHOLECYSTECTOMY    . CYST REMOVAL HAND Left 1992   Ganglion cyst removed from Left Wrist  . DILATATION & CURETTAGE/HYSTEROSCOPY WITH MYOSURE    . KNEE ARTHROSCOPY Left 1992  . PANCREAS SURGERY    . PORTA CATH INSERTION N/A 04/10/2017   Procedure: PORTA CATH INSERTION;  Surgeon: Algernon Huxley, MD;  Location: [place] [facility];  Service: Cardiovascular;  Laterality: N/A;  . REPAIR OF ESOPHAGUS  2012   3 clamps placed in esophagus    Prior to Admission medications   Medication Sig Start Date End Date Taking? Authorizing Provider  amLODipine (NORVASC) 5 MG tablet Take 5 mg by mouth daily.    [provider]  aspirin EC 81 MG tablet Take 81 mg by mouth daily.    [provider]  Calcium Carb-Cholecalciferol (CALCIUM 600/VITAMIN D3 PO) Take 1 tablet by mouth daily.    [provider]  cloNIDine (CATAPRES) 0.1 MG tablet Take 0.1 mg by mouth daily.  [provider]  colestipol (COLESTID) 1 g tablet Take 2 g by mouth daily.    [provider]  lipase/protease/amylase (CREON) 36000 UNITS CPEP capsule Take 108,000 Units by mouth 3 (three) times daily before meals.    [provider]  lisinopril (PRINIVIL,ZESTRIL) 40 MG tablet Take 40 mg by mouth daily.    [provider]  NOVOLOG 100 UNIT/ML injection 15-20 Units by Other route See admin instructions. Use up to 15-20 units daily via insulin pump. 07/24/16   [provider]  oxyCODONE (ROXICODONE) 5 MG immediate release tablet Take 1  tablet (5 mg total) by mouth every 8 (eight) hours as needed. Patient taking differently: Take 5 mg by mouth every 8 (eight) hours as needed for moderate pain.  01/06/17 01/06/18  Nance Pear, MD  pantoprazole (PROTONIX) 40 MG tablet Take 1 tablet (40 mg total) by mouth 2 (two) times daily. Patient taking differently: Take 40 mg by mouth daily.  09/29/15   Henreitta Leber, MD  vitamin E 400 UNIT capsule Take 400 Units by mouth daily.    [provider]    Allergies  Allergen Reactions  . Metoclopramide Hives  . Morphine And Related Anaphylaxis and Other (See Comments)    Pt states that this medication causes cardiac arrest.    . Morpholine Salicylate Nausea And Vomiting, Other (See Comments) and Rash    CHF  . Latex Rash  . Demerol [Meperidine] Hives and Other (See Comments)    Pt states that this medication causes cardiac arrest.    . Dilaudid [Hydromorphone Hcl] Nausea And Vomiting and Other (See Comments)    Pt states that this medication causes cardiac arrest.    . Isosorbide Nitrate Other (See Comments)    Other reaction(s): Headache   . Sulfa Antibiotics Nausea And Vomiting  . Darvon [Propoxyphene] Nausea And Vomiting and Rash  . Flexeril [Cyclobenzaprine] Nausea And Vomiting and Rash  . Levaquin [Levofloxacin In D5w] Nausea And Vomiting and Rash  . Nsaids Rash and Other (See Comments)    Naproxen causes a rash; patient is able to tolerate Advil   . Soma [Carisoprodol] Nausea And Vomiting and Rash  . Zofran [Ondansetron Hcl] Nausea And Vomiting and Rash    Family History  Problem Relation Age of Onset  . Cirrhosis Mother   . Colon cancer Mother     Social History Social History   Tobacco Use  . Smoking status: Never Smoker  . Smokeless tobacco: Never Used  Substance Use Topics  . Alcohol use: No  . Drug use: No    Review of Systems Constitutional: Negative for fever.  Positive for generalized fatigue/weakness over the past 1-2 weeks. Eyes:  Negative for visual complaints ENT: Negative for recent illness/congestion Cardiovascular: Negative for chest pain. Respiratory: Negative for shortness of breath. Gastrointestinal: Negative for abdominal pain.  Some abdominal swelling. Genitourinary: Negative for urinary compaints Musculoskeletal: Swelling in her arms and legs. Skin: Negative for skin complaints  Neurological: Negative for headache All other ROS negative  ____________________________________________   PHYSICAL EXAM:  VITAL SIGNS: ED Triage Vitals [05/23/17 0959]  Enc Vitals Group     BP (!) 167/96     Pulse Rate 80     Resp 18     Temp 98.4 F (36.9 C)     Temp Source Oral     SpO2 100 %     Weight 137 lb (62.1 kg)     Height 5' 3"  (1.6 m)  Head Circumference      Peak Flow      Pain Score 9     Pain Loc      Pain Edu?      Excl. in [place]?     Constitutional: Alert and oriented. Well appearing and in no distress. Eyes: Normal exam ENT   Head: Normocephalic and atraumatic.   Mouth/Throat: Mucous membranes are moist. Cardiovascular: Normal rate, regular rhythm. No murmur Respiratory: Normal respiratory effort without tachypnea nor retractions. Breath sounds are clear.  Right chest port present, incisions appear well-healed. Gastrointestinal: Soft and nontender. No distention.   Musculoskeletal: Nontender with normal range of motion in all extremities.  Mild lower and upper extremity edema, appears to be nonpitting.  Neurovascular intact distally in all extremities. Neurologic:  Normal speech and language. No gross focal neurologic deficits Skin:  Skin is warm, dry and intact.  Psychiatric: Mood and affect are normal.     INITIAL IMPRESSION / ASSESSMENT AND PLAN / ED COURSE  Pertinent labs & imaging results that were available during my care of the patient were reviewed by me and considered in my medical decision making (see chart for details).  Patient presents to the emergency department  for swelling in her extremities generalized fatigue and weakness, sent to the emergency department for blood draw by her rheumatologist.  Overall the patient appears well, no distress.  Differential would include recurrence of vasculitis, CHF, other autoimmune conditions, peripheral edema.  We will check labs in the emergency department.  Several requested will not results today including hemoglobin A1c and CRP, patient is aware of this and will follow up with her doctor.  Overall patient's physical exam is very reassuring, very slight edema in all extremities.  No abdominal tenderness.  No chest pain or shortness of breath.  Patient is requesting discharge from the emergency department.  States she was only sent here for lab draw.  Lab results are still pending.  Patient states she will follow-up with her rheumatologist for the results.  Patient does not appear to have a acutely concerning presentation.  We will discharge patient from the emergency department at this time.  ----------------------------------------- 12:52 PM on 05/23/2017 -----------------------------------------  After the patient had left the emergency department her labs are resulted showing a potassium of 2.6.  Patient states a history of low potassium in the past, has not been taking her potassium supplements.  I discussed the patient with her physician Dr. Candiss Norse, I called the patient a prescription for 40 mEq potassium supplements to be taken for the next 7 days.  Dr. Candiss Norse will arrange with the infusion center have the patient present for lab draw to reassess potassium level, I discussed with the patient if she cannot get into the infusion center for repeat lab draw in the next several days to return to the emergency department for recheck.  Patient agreeable to this plan of care.    ____________________________________________   FINAL CLINICAL IMPRESSION(S) / ED DIAGNOSES  Anasarca Generalized weakness    Harvest Dark, MD 05/23/17 1128    Harvest Dark, MD 05/23/17 1302

## 2017-05-23 NOTE — Discharge Instructions (Addendum)
These follow-up with your doctor for lab results.  Return to the emergency department for any acutely concerning symptoms.

## 2017-05-24 LAB — HEMOGLOBIN A1C
Hgb A1c MFr Bld: 8 % — ABNORMAL HIGH (ref 4.8–5.6)
Mean Plasma Glucose: 182.9 mg/dL

## 2017-05-29 ENCOUNTER — Inpatient Hospital Stay: Payer: Medicare Other

## 2017-05-29 ENCOUNTER — Encounter: Payer: Self-pay | Admitting: Oncology

## 2017-05-29 ENCOUNTER — Other Ambulatory Visit: Payer: Self-pay

## 2017-05-29 ENCOUNTER — Inpatient Hospital Stay: Payer: Medicare Other | Attending: Oncology | Admitting: Oncology

## 2017-05-29 VITALS — BP 125/90 | HR 116 | Temp 97.4°F | Resp 18 | Wt 136.0 lb

## 2017-05-29 DIAGNOSIS — Z8042 Family history of malignant neoplasm of prostate: Secondary | ICD-10-CM

## 2017-05-29 DIAGNOSIS — R209 Unspecified disturbances of skin sensation: Secondary | ICD-10-CM | POA: Diagnosis not present

## 2017-05-29 DIAGNOSIS — R197 Diarrhea, unspecified: Secondary | ICD-10-CM | POA: Diagnosis not present

## 2017-05-29 DIAGNOSIS — I10 Essential (primary) hypertension: Secondary | ICD-10-CM | POA: Diagnosis not present

## 2017-05-29 DIAGNOSIS — M7989 Other specified soft tissue disorders: Secondary | ICD-10-CM | POA: Insufficient documentation

## 2017-05-29 DIAGNOSIS — E876 Hypokalemia: Secondary | ICD-10-CM

## 2017-05-29 DIAGNOSIS — Z794 Long term (current) use of insulin: Secondary | ICD-10-CM | POA: Diagnosis not present

## 2017-05-29 DIAGNOSIS — E119 Type 2 diabetes mellitus without complications: Secondary | ICD-10-CM

## 2017-05-29 DIAGNOSIS — Z800 Family history of malignant neoplasm of digestive organs: Secondary | ICD-10-CM | POA: Diagnosis not present

## 2017-05-29 DIAGNOSIS — Z8541 Personal history of malignant neoplasm of cervix uteri: Secondary | ICD-10-CM

## 2017-05-29 DIAGNOSIS — Z803 Family history of malignant neoplasm of breast: Secondary | ICD-10-CM

## 2017-05-29 DIAGNOSIS — R634 Abnormal weight loss: Secondary | ICD-10-CM | POA: Insufficient documentation

## 2017-05-29 DIAGNOSIS — Z95828 Presence of other vascular implants and grafts: Secondary | ICD-10-CM

## 2017-05-29 LAB — HEMOGLOBIN A1C
Hgb A1c MFr Bld: 7.9 % — ABNORMAL HIGH (ref 4.8–5.6)
Mean Plasma Glucose: 180.03 mg/dL

## 2017-05-29 LAB — COMPREHENSIVE METABOLIC PANEL
ALT: 51 U/L (ref 14–54)
AST: 34 U/L (ref 15–41)
Albumin: 4.4 g/dL (ref 3.5–5.0)
Alkaline Phosphatase: 129 U/L — ABNORMAL HIGH (ref 38–126)
Anion gap: 9 (ref 5–15)
BUN: 20 mg/dL (ref 6–20)
CO2: 25 mmol/L (ref 22–32)
Calcium: 8.3 mg/dL — ABNORMAL LOW (ref 8.9–10.3)
Chloride: 101 mmol/L (ref 101–111)
Creatinine, Ser: 0.7 mg/dL (ref 0.44–1.00)
GFR calc Af Amer: >60 mL/min (ref >60)
GFR calc non Af Amer: >60 mL/min (ref >60)
Glucose, Bld: 216 mg/dL — ABNORMAL HIGH (ref 65–99)
Potassium: 4.5 mmol/L (ref 3.5–5.1)
Sodium: 135 mmol/L (ref 135–145)
Total Bilirubin: 5.1 mg/dL — ABNORMAL HIGH (ref 0.3–1.2)
Total Protein: 7.5 g/dL (ref 6.5–8.1)

## 2017-05-29 MED ORDER — HEPARIN SOD (PORK) LOCK FLUSH 100 UNIT/ML IV SOLN
500.0000 [IU] | INTRAVENOUS | Status: AC | PRN
  Administered 2017-05-29: 500 [IU]

## 2017-05-29 MED ORDER — SODIUM CHLORIDE 0.9% FLUSH
10.0000 mL | INTRAVENOUS | Status: AC | PRN
  Administered 2017-05-29: 10 mL
  Filled 2017-05-29: qty 10

## 2017-05-29 NOTE — Progress Notes (Signed)
Hematology/Oncology Consult note [facility] Telephone:([phone] [phone]   Patient Care Team: Glendon Axe, MD as PCP - General (Internal Medicine)  REFERRING PROVIDER: Glendon Axe, MD CHIEF COMPLAINTS/PURPOSE OF CONSULTATION:  Evaluation of Mediport assessment.  HISTORY OF PRESENTING ILLNESS:  Amber Christensen is a  52 y.o.  female with PMH listed below who was referred to me for evaluation prior to Mediport assessment. Patient has multiple underlying medical problems including vasculitis, hypertension, hypomagnesia GERD,, diabetes, NASH, cirrhosis, pancreatitis, steatorrhea.  She also has multiple gastric polyps and reports to have had extensive GI workup closely. Last seen by [facility] Dr.Skulskie's NP and was referred back to [facility].  She has poor peripheral vein access and has recently had a Mediport placement for vein access.  She was [facility] a week ago complaining increasing swelling in her arms and legs.  She was found to have severe hypokalemia.  She had a history of low potassium and is not taking her potassium supplements.  ED communicated with Dr. Candiss Norse and the patient was advised to take potassium supplements.  Patient was referred to infusion center for lab draw from [place] and reassess of potassium level today.  She needs to be seen by me prior to being taken care of at infusion center.  Patient reports that her medi report was placed in a month ago and was first used in the emergency room 1 week ago.  Denies any erythema, local swelling or tenderness around the MediPort. Reports remote history of cervical cancer at age of 74, status post hysterectomy, follows up with [facility] clinic GYN. She also reports that she has extensive family history of cancer, including breast cancer, colon cancer, cervical cancer in two generation of family members, and feels very anxious.  Diabetes, using insuline pump. Chronic  neuropathy.   Review of Systems  Constitutional: Positive for malaise/fatigue and weight loss. Negative for chills and fever.  HENT: Negative for hearing loss and nosebleeds.   Eyes: Negative for pain.  Respiratory: Negative for cough, hemoptysis and sputum production.   Cardiovascular: Positive for leg swelling. Negative for chest pain.  Gastrointestinal: Positive for diarrhea. Negative for abdominal pain.  Genitourinary: Negative for dysuria and urgency.  Musculoskeletal: Negative for myalgias and neck pain.  Neurological: Positive for tingling. Negative for dizziness and sensory change.  Endo/Heme/Allergies: Does not bruise/bleed easily.  Psychiatric/Behavioral: Negative for substance abuse.    MEDICAL HISTORY:  Past Medical History:  Diagnosis Date  . Anxiety   . Blood transfusion without reported diagnosis   . Cervical cancer ([place])   . Clotting disorder ([place])   . Depression   . Diabetes mellitus without complication ([place])   . GERD (gastroesophageal reflux disease)   . Hypertension   . Liver disease   . Pancreatitis   . Thyroid disease     SURGICAL HISTORY: Past Surgical History:  Procedure Laterality Date  . ABDOMINAL HYSTERECTOMY    . APPENDECTOMY    . BLADDER SURGERY    . BOWEL RESECTION    . BREAST BIOPSY Left 1989  . CHOLECYSTECTOMY    . CYST REMOVAL HAND Left 1992   Ganglion cyst removed from Left Wrist  . DILATATION & CURETTAGE/HYSTEROSCOPY WITH MYOSURE    . KNEE ARTHROSCOPY Left 1992  . PANCREAS SURGERY    . PORTA CATH INSERTION N/A 04/10/2017   Procedure: PORTA CATH INSERTION;  Surgeon: Algernon Huxley, MD;  Location: [place] [facility];  Service: Cardiovascular;  Laterality: N/A;  .  REPAIR OF ESOPHAGUS  2012   3 clamps placed in esophagus    SOCIAL HISTORY: Social History   Socioeconomic History  . Marital status: Married    Spouse name: Not on file  . Number of children: Not on file  . Years of education: Not on file  . Highest education level:  Not on file  Occupational History  . Not on file  Social Needs  . Financial resource strain: Not on file  . Food insecurity:    Worry: Not on file    Inability: Not on file  . Transportation needs:    Medical: Not on file    Non-medical: Not on file  Tobacco Use  . Smoking status: Never Smoker  . Smokeless tobacco: Never Used  Substance and Sexual Activity  . Alcohol use: No  . Drug use: No  . Sexual activity: Yes    Birth control/protection: Surgical  Lifestyle  . Physical activity:    Days per week: Not on file    Minutes per session: Not on file  . Stress: Not on file  Relationships  . Social connections:    Talks on phone: Not on file    Gets together: Not on file    Attends religious service: Not on file    Active member of club or organization: Not on file    Attends meetings of clubs or organizations: Not on file    Relationship status: Not on file  . Intimate partner violence:    Fear of current or ex partner: Not on file    Emotionally abused: Not on file    Physically abused: Not on file    Forced sexual activity: Not on file  Other Topics Concern  . Not on file  Social History Narrative   Working, independent at baseline    FAMILY HISTORY: Family History  Problem Relation Age of Onset  . Cirrhosis Mother   . Colon cancer Mother   . Hypertension Mother   . Breast cancer Mother   . Cancer Father   . Hypertension Father   . Prostate cancer Father   . Liver cancer Father   . Cancer Maternal Aunt   . Breast cancer Maternal Aunt   . Uterine cancer Maternal Aunt   . Cervical cancer Maternal Aunt   . Cancer Maternal Uncle   . Cancer Paternal Aunt   . Cancer Paternal Uncle   . Pancreatic cancer Paternal Uncle   . Breast cancer Maternal Grandmother   . Colon cancer Maternal Grandmother   . Cancer Maternal Grandfather   Is  ALLERGIES:  is allergic to metoclopramide; morphine and related; morpholine salicylate; latex; demerol [meperidine]; dilaudid  [hydromorphone hcl]; isosorbide nitrate; sulfa antibiotics; darvon [propoxyphene]; flexeril [cyclobenzaprine]; levaquin [levofloxacin in d5w]; nsaids; soma [carisoprodol]; and zofran [ondansetron hcl].  MEDICATIONS:  Current Outpatient Medications  Medication Sig Dispense Refill  . amLODipine (NORVASC) 5 MG tablet Take 5 mg by mouth daily.    Marland Kitchen aspirin EC 81 MG tablet Take 81 mg by mouth daily.    . Calcium Carb-Cholecalciferol (CALCIUM 600/VITAMIN D3 PO) Take 1 tablet by mouth daily.    . cloNIDine (CATAPRES) 0.1 MG tablet Take 0.1 mg by mouth daily.    . colestipol (COLESTID) 1 g tablet Take 2 g by mouth daily.    . hydrochlorothiazide (HYDRODIURIL) 12.5 MG tablet Take by mouth.    . lipase/protease/amylase (CREON) 36000 UNITS CPEP capsule Take 108,000 Units by mouth 3 (three) times daily before meals.    Marland Kitchen  lisinopril (PRINIVIL,ZESTRIL) 40 MG tablet Take 40 mg by mouth daily.    Marland Kitchen NOVOLOG 100 UNIT/ML injection 15-20 Units by Other route See admin instructions. Use up to 15-20 units daily via insulin pump.    . pantoprazole (PROTONIX) 40 MG tablet Take 1 tablet (40 mg total) by mouth 2 (two) times daily. (Patient taking differently: Take 40 mg by mouth daily. ) 60 tablet 1  . vitamin E 400 UNIT capsule Take 400 Units by mouth daily.    Marland Kitchen oxyCODONE (ROXICODONE) 5 MG immediate release tablet Take 1 tablet (5 mg total) by mouth every 8 (eight) hours as needed. (Patient not taking: Reported on 05/29/2017) 20 tablet 0   No current facility-administered medications for this visit.      PHYSICAL EXAMINATION: ECOG PERFORMANCE STATUS: 1 - Symptomatic but completely ambulatory Vitals:   05/29/17 0940  BP: 125/90  Pulse: (!) 116  Resp: 18  Temp: (!) 97.4 F (36.3 C)   Filed Weights   05/29/17 0940  Weight: 136 lb (61.7 kg)    Physical Exam  Constitutional: She is oriented to person, place, and time. She appears well-developed and well-nourished.  HENT:  Head: Normocephalic and  atraumatic.  Mouth/Throat: No oropharyngeal exudate.  Eyes: Pupils are equal, round, and reactive to light. EOM are normal. No scleral icterus.  Neck: Normal range of motion. Neck supple.  Cardiovascular: Normal rate, regular rhythm and normal heart sounds.  No murmur heard. Pulmonary/Chest: Effort normal and breath sounds normal.  Abdominal: Soft.  Musculoskeletal: Normal range of motion.  Neurological: She is alert and oriented to person, place, and time.  Skin: Skin is warm.  Psychiatric:  anxious     LABORATORY DATA:  I have reviewed the data as listed Lab Results  Component Value Date   WBC 6.0 05/23/2017   HGB 13.4 05/23/2017   HCT 38.3 05/23/2017   MCV 87.7 05/23/2017   PLT 165 05/23/2017   Recent Labs    03/19/17 1922 05/23/17 1056 05/29/17 1101  NA 138 137 135  K 3.1* 2.6* 4.5  CL 102 102 101  CO2 28 28 25   GLUCOSE 303* 158* 216*  BUN 12 11 20   CREATININE 0.72 0.51 0.70  CALCIUM 8.0* 8.0* 8.3*  GFRNONAA >60 >60 >60  GFRAA >60 >60 >60  PROT 6.9 6.9 7.5  ALBUMIN 3.9 4.2 4.4  AST 35 34 34  ALT 42 38 51  ALKPHOS 159* 141* 129*  BILITOT 4.2* 5.6* 5.1*       ASSESSMENT & PLAN:  1. Hypokalemia   2. Diabetes mellitus without complication ([place])   3. Family history of breast cancer   4. Family history of colon cancer   5. Family history of prostate cancer   6. History of cervical cancer    Discussed with patient that I will coordinate with our lab at [place] to access patient's MediPort and draw labs, including HbA1c and repeat metabolic panel as requested by primary care physician and patient's endocrinologist.  In the future because patient does not have any active oncology problems or any hematological problems, cancer center infusion center and labs will not be able to accommodate her need of Mediport assessment and laboratory.  I discussed with cancer center manager.  Today's potassium level is 4.5, normal normal and she does not need any IV  potassium today.   I have communicated the lab results with patient's primary care physician Dr. Candiss Norse.  Patient will need a referral from PCP to same to  same day surgery where patient's need can be accommodated there.  #Regarding to patient's remote history of cervical cancer, I advised patient to continue follow-up with GYN physician for surveillance.  In the future, I will be happy to see patient if she develops any disease recurrence.   #Patient has extensive family history, including breast and colon cancer in morning to generation, family history of prostate cancer, pancreatic cancer, cervical cancer, and patient's personal history of cervical cancerin a patient and the patient denies ever, had any genetic testing done.  I will refer patient to talk to genetic counselor and do genetic testing.  Patient voices understanding and agrees with plan.  All questions were answered. The patient knows to call the clinic with any problems questions or concerns.  Return of visit: As needed in the future. Thank you for this kind referral and the opportunity to participate in the care of this patient. A copy of today's note is routed to referring provider    Earlie Server, MD, PhD Hematology Oncology [facility] at [facility] Pager- [phone] 05/29/2017

## 2017-05-29 NOTE — Progress Notes (Signed)
Pt in a new patient for evaluation and maintenance of port a cath.  Pt with significant medical history.  Wears an insulin pump.  Has prescription for labs,orderd by Dr Candiss Norse for today.

## 2017-05-30 ENCOUNTER — Encounter: Payer: Self-pay | Admitting: Genetic Counselor

## 2017-05-30 ENCOUNTER — Telehealth: Payer: Self-pay | Admitting: Oncology

## 2017-05-30 ENCOUNTER — Telehealth: Payer: Self-pay | Admitting: Genetic Counselor

## 2017-05-30 ENCOUNTER — Other Ambulatory Visit: Payer: Self-pay | Admitting: Genetic Counselor

## 2017-05-30 DIAGNOSIS — Z809 Family history of malignant neoplasm, unspecified: Secondary | ICD-10-CM

## 2017-05-30 NOTE — Telephone Encounter (Signed)
PORT LABS (send one EDTA to [facility]) per Dr Yu/schd msg.  (msg sent to [facility] as requested, per DR Tasia Catchings)  Appt schd for 06/01/17 @ 10:45 a.m.

## 2017-05-30 NOTE — Telephone Encounter (Signed)
Cancer Genetics            Telegenetics Initial Visit    Patient Name: Amber Christensen Patient DOB: 28-Aug-1965 Patient Age: 52 y.o. Phone Call Date: 05/30/2017  Referring Provider: Earlie Server, MD  Reason for Visit: Evaluate for hereditary susceptibility to cancer    Assessment and Plan:  . Ms. Amber Christensen's family history is suggestive of a hereditary predisposition to cancer, but she is unclear about much of her family history and it is not certain the reported history is accurate.   . Testing is recommended to determine whether she has a pathogenic mutation that will impact her screening and risk-reduction for cancer. If her reported family history is accurate, her risk of developing will remain elevated even with a negative genetic test result.  . Ms. Amber Christensen wished to pursue genetic testing and will be called to schedule a lab visit for a blood draw. Analysis will include the 83 genes on [facility]'s Multi-Cancer panel (ALK, APC, ATM, AXIN2, BAP1, BARD1, BLM, BMPR1A, BRCA1, BRCA2, BRIP1, CASR, CDC73, CDH1, CDK4, CDKN1B, CDKN1C, CDKN2A, CEBPA, CHEK2, CTNNA1, DICER1, DIS3L2, EGFR, EPCAM, FH, FLCN, GATA2, GPC3, GREM1, HOXB13, HRAS, KIT, MAX, MEN1, MET, MITF, MLH1, MSH2, MSH3, MSH6, MUTYH, NBN, NF1, NF2, NTHL1, PALB2, PDGFRA, PHOX2B, PMS2, POLD1, POLE, POT1, PRKAR1A, PTCH1, PTEN, RAD50, RAD51C, RAD51D, RB1, RECQL4, RET, RUNX1, SDHA, SDHAF2, SDHB, SDHC, SDHD, SMAD4, SMARCA4, SMARCB1, SMARCE1, STK11, SUFU, TERC, TERT, TMEM127, TP53, TSC1, TSC2, VHL, WRN, WT1).   . Once the lab receives her specimen, results should be available in approximately 2-3 weeks, at which point we will contact her and address implications for her as well as address genetic testing for at-risk family members, if needed.     Dr. Grayland Christensen was available for questions concerning this case. Total time spent by counseling by phone was approximately 30 minutes.    _____________________________________________________________________   History of Present Illness: Ms. Amber Christensen, a 52 y.o. female, was referred for genetic counseling to discuss the possibility of a hereditary predisposition to cancer and discuss whether genetic testing is warranted. This was a telegenetics visit via phone.  Amber Christensen has a very complex medical history. She reported that she had a TAH/BSO at age 68 due to cervical cancer. She reported that her last mammogram was about 10 years ago while living in [place]. She knows she is recommended to schedule this screening, but has not done so.   She reports a very strong family history of cancer (see below), but is unsure of specific primaries or ages at diagnoses.  Past Medical History:  Diagnosis Date  . Allergy   . Anemia   . Anxiety   . 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 Blood transfusion without reported diagnosis   . Cervical cancer ([place])   . Clotting disorder ([place])   . Depression   . Diabetes mellitus without complication ([place])   . GERD (gastroesophageal reflux disease)   . Heart disease   . 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[place])   . Hypertension   . Liver disease   . Pancreatitis   . Thyroid disease     Past Surgical History:  Procedure Laterality Date  . ABDOMINAL HYSTERECTOMY    . APPENDECTOMY    . BLADDER SURGERY    . BOWEL RESECTION    . BREAST BIOPSY Left 1989  . CHOLECYSTECTOMY    . CYST REMOVAL HAND Left 1992   Ganglion cyst removed from Left Wrist  .  DILATATION & CURETTAGE/HYSTEROSCOPY WITH MYOSURE    . KNEE ARTHROSCOPY Left 1992  . PANCREAS SURGERY    . PORTA CATH INSERTION N/A 04/10/2017   Procedure: PORTA CATH INSERTION;  Surgeon: Amber Huxley, MD;  Location: [facility];  Service: Cardiovascular;  Laterality: N/A;  . REPAIR OF ESOPHAGUS  2012   3 clamps placed in esophagus    Family History: Significant diagnoses include the following:  Family History  Problem Relation Age of Onset  .  Cirrhosis Mother   . Colon cancer Mother 65       TAH/BSO in ~4; deceased at 107  . Hypertension Mother   . Breast cancer Mother 14       unconfirmed  . Hypertension Father   . Prostate cancer Father 85       currently 25  . Other Father        liver disease  . Breast cancer Maternal Aunt        age at dx unk.; currently 8  . Cervical cancer Maternal Aunt   . Cancer Maternal Uncle        unk. type; currently 82s  . Stomach cancer Paternal Aunt 12       deceased at 10  . Breast cancer Maternal Grandmother 61       possibly bilateral in 19s; deceased 106  . Non-Hodgkin's lymphoma Daughter        unconfirmed; currently 41  . Cancer Maternal Aunt        hematologic malignancy; deceased 6    Additionally, Amber Christensen has 3 daughters (ages 49, 81 and 53). She has a sister (age 7) and a brother (age 26)  Ms. Amber Christensen's ancestry is Caucasian - NOS. There is no consanguinity.  Discussion: We reviewed the characteristics, features and inheritance patterns of hereditary cancer syndromes. We discussed her risk of harboring a mutation in the context of her personal and family history. We discussed that giving her an accurate risk assessment is challenging given that she is unclear about specific diagnoses in her family and is unsure about ages of onset of cancers. We discussed the process of genetic testing, insurance coverage and implications of results: positive, negative and variant of unknown significance (VUS).   Ms. Amber Christensen questions were answered to her satisfaction today and she is welcome to call with any additional questions or concerns. Thank you for the referral and allowing [place] to share in the care of your patient.    Amber Berg, MS, [place] Certified Genetic Counselor phone: [phone]

## 2017-06-01 ENCOUNTER — Ambulatory Visit
Admission: RE | Admit: 2017-06-01 | Discharge: 2017-06-01 | Disposition: A | Discharge status: Home / Self Care | Payer: Medicare Other | Source: Ambulatory Visit | Attending: Physician Assistant | Admitting: Physician Assistant

## 2017-06-01 ENCOUNTER — Other Ambulatory Visit
Admission: RE | Admit: 2017-06-01 | Discharge: 2017-06-01 | Disposition: A | Discharge status: Home / Self Care | Payer: Medicare Other | Source: Ambulatory Visit | Attending: Physician Assistant | Admitting: Physician Assistant

## 2017-06-01 ENCOUNTER — Other Ambulatory Visit: Payer: Self-pay | Admitting: Physician Assistant

## 2017-06-01 ENCOUNTER — Inpatient Hospital Stay: Payer: Medicare Other

## 2017-06-01 DIAGNOSIS — K861 Other chronic pancreatitis: Secondary | ICD-10-CM | POA: Insufficient documentation

## 2017-06-01 DIAGNOSIS — R1012 Left upper quadrant pain: Secondary | ICD-10-CM

## 2017-06-01 DIAGNOSIS — R143 Flatulence: Secondary | ICD-10-CM | POA: Diagnosis not present

## 2017-06-01 DIAGNOSIS — K760 Fatty (change of) liver, not elsewhere classified: Secondary | ICD-10-CM | POA: Insufficient documentation

## 2017-06-01 LAB — BASIC METABOLIC PANEL
Anion gap: 9 (ref 5–15)
BUN: 19 mg/dL (ref 6–20)
CO2: 28 mmol/L (ref 22–32)
Calcium: 7.5 mg/dL — ABNORMAL LOW (ref 8.9–10.3)
Chloride: 96 mmol/L — ABNORMAL LOW (ref 101–111)
Creatinine, Ser: 0.63 mg/dL (ref 0.44–1.00)
GFR calc Af Amer: >60 mL/min (ref >60)
GFR calc non Af Amer: >60 mL/min (ref >60)
Glucose, Bld: 213 mg/dL — ABNORMAL HIGH (ref 65–99)
Potassium: 3.6 mmol/L (ref 3.5–5.1)
Sodium: 133 mmol/L — ABNORMAL LOW (ref 135–145)

## 2017-06-01 LAB — CBC WITH DIFFERENTIAL/PLATELET
Basophils Absolute: 0 10*3/uL (ref 0–0.1)
Basophils Relative: 1 %
Eosinophils Absolute: 0.2 10*3/uL (ref 0–0.7)
Eosinophils Relative: 2 %
HCT: 38.8 % (ref 35.0–47.0)
Hemoglobin: 13.7 g/dL (ref 12.0–16.0)
Lymphocytes Relative: 43 %
Lymphs Abs: 2.9 10*3/uL (ref 1.0–3.6)
MCH: 32 pg (ref 26.0–34.0)
MCHC: 35.2 g/dL (ref 32.0–36.0)
MCV: 90.9 fL (ref 80.0–100.0)
Monocytes Absolute: 0.4 10*3/uL (ref 0.2–0.9)
Monocytes Relative: 6 %
Neutro Abs: 3.2 10*3/uL (ref 1.4–6.5)
Neutrophils Relative %: 48 %
Platelets: 166 10*3/uL (ref 150–440)
RBC: 4.26 MIL/uL (ref 3.80–5.20)
RDW: 13.4 % (ref 11.5–14.5)
WBC: 6.6 10*3/uL (ref 3.6–11.0)

## 2017-06-01 LAB — HEPATIC FUNCTION PANEL
ALT: 61 U/L — ABNORMAL HIGH (ref 14–54)
AST: 51 U/L — ABNORMAL HIGH (ref 15–41)
Albumin: 4 g/dL (ref 3.5–5.0)
Alkaline Phosphatase: 112 U/L (ref 38–126)
Bilirubin, Direct: 0.3 mg/dL (ref 0.1–0.5)
Indirect Bilirubin: 4.7 mg/dL — ABNORMAL HIGH (ref 0.3–0.9)
Total Bilirubin: 5 mg/dL — ABNORMAL HIGH (ref 0.3–1.2)
Total Protein: 6.8 g/dL (ref 6.5–8.1)

## 2017-06-01 LAB — LIPASE, BLOOD: Lipase: 19 U/L (ref 11–51)

## 2017-06-01 MED ORDER — IOPAMIDOL (ISOVUE-300) INJECTION 61%
85.0000 mL | Freq: Once | INTRAVENOUS | Status: AC | PRN
  Administered 2017-06-01: 85 mL via INTRAVENOUS

## 2017-06-01 MED ORDER — HEPARIN SOD (PORK) LOCK FLUSH 100 UNIT/ML IV SOLN
500.0000 [IU] | INTRAVENOUS | Status: DC | PRN
  Administered 2017-06-01: 500 [IU]
  Filled 2017-06-01 (×2): qty 5

## 2017-06-01 MED ORDER — HEPARIN SOD (PORK) LOCK FLUSH 100 UNIT/ML IV SOLN: 500.0000 [IU] | INTRAVENOUS | Status: DC

## 2017-06-02 ENCOUNTER — Emergency Department: Payer: Medicare Other

## 2017-06-02 ENCOUNTER — Other Ambulatory Visit: Payer: Self-pay

## 2017-06-02 ENCOUNTER — Encounter: Payer: Self-pay | Admitting: Emergency Medicine

## 2017-06-02 ENCOUNTER — Emergency Department
Admission: EM | Admit: 2017-06-02 | Discharge: 2017-06-02 | Disposition: A | Discharge status: Home / Self Care | Payer: Medicare Other | Attending: Emergency Medicine | Admitting: Emergency Medicine

## 2017-06-02 DIAGNOSIS — E119 Type 2 diabetes mellitus without complications: Secondary | ICD-10-CM | POA: Diagnosis not present

## 2017-06-02 DIAGNOSIS — Z79899 Other long term (current) drug therapy: Secondary | ICD-10-CM | POA: Insufficient documentation

## 2017-06-02 DIAGNOSIS — Z7982 Long term (current) use of aspirin: Secondary | ICD-10-CM | POA: Diagnosis not present

## 2017-06-02 DIAGNOSIS — F419 Anxiety disorder, unspecified: Secondary | ICD-10-CM | POA: Insufficient documentation

## 2017-06-02 DIAGNOSIS — Z9049 Acquired absence of other specified parts of digestive tract: Secondary | ICD-10-CM | POA: Diagnosis not present

## 2017-06-02 DIAGNOSIS — I10 Essential (primary) hypertension: Secondary | ICD-10-CM | POA: Diagnosis not present

## 2017-06-02 DIAGNOSIS — Z8541 Personal history of malignant neoplasm of cervix uteri: Secondary | ICD-10-CM | POA: Diagnosis not present

## 2017-06-02 DIAGNOSIS — Z91040 Latex allergy status: Secondary | ICD-10-CM | POA: Insufficient documentation

## 2017-06-02 DIAGNOSIS — R110 Nausea: Secondary | ICD-10-CM | POA: Diagnosis not present

## 2017-06-02 DIAGNOSIS — R1907 Generalized intra-abdominal and pelvic swelling, mass and lump: Secondary | ICD-10-CM | POA: Diagnosis present

## 2017-06-02 DIAGNOSIS — Z794 Long term (current) use of insulin: Secondary | ICD-10-CM | POA: Diagnosis not present

## 2017-06-02 DIAGNOSIS — F329 Major depressive disorder, single episode, unspecified: Secondary | ICD-10-CM | POA: Insufficient documentation

## 2017-06-02 LAB — SEDIMENTATION RATE: Sed Rate: 7 mm/hr (ref 0–30)

## 2017-06-02 LAB — MAGNESIUM: Magnesium: 1.3 mg/dL — ABNORMAL LOW (ref 1.7–2.4)

## 2017-06-02 LAB — TROPONIN I: Troponin I: <0.03 ng/mL (ref <0.03)

## 2017-06-02 LAB — LIPASE, BLOOD: Lipase: 22 U/L (ref 11–51)

## 2017-06-02 LAB — CBC
HCT: 38.6 % (ref 35.0–47.0)
Hemoglobin: 13.7 g/dL (ref 12.0–16.0)
MCH: 32.3 pg (ref 26.0–34.0)
MCHC: 35.4 g/dL (ref 32.0–36.0)
MCV: 91 fL (ref 80.0–100.0)
Platelets: 165 10*3/uL (ref 150–440)
RBC: 4.24 MIL/uL (ref 3.80–5.20)
RDW: 13.1 % (ref 11.5–14.5)
WBC: 7.6 10*3/uL (ref 3.6–11.0)

## 2017-06-02 LAB — COMPREHENSIVE METABOLIC PANEL
ALT: 63 U/L — ABNORMAL HIGH (ref 14–54)
AST: 47 U/L — ABNORMAL HIGH (ref 15–41)
Albumin: 4 g/dL (ref 3.5–5.0)
Alkaline Phosphatase: 116 U/L (ref 38–126)
Anion gap: 8 (ref 5–15)
BUN: 20 mg/dL (ref 6–20)
CO2: 27 mmol/L (ref 22–32)
Calcium: 7.8 mg/dL — ABNORMAL LOW (ref 8.9–10.3)
Chloride: 102 mmol/L (ref 101–111)
Creatinine, Ser: 0.48 mg/dL (ref 0.44–1.00)
GFR calc Af Amer: >60 mL/min (ref >60)
GFR calc non Af Amer: >60 mL/min (ref >60)
Glucose, Bld: 231 mg/dL — ABNORMAL HIGH (ref 65–99)
Potassium: 3.9 mmol/L (ref 3.5–5.1)
Sodium: 137 mmol/L (ref 135–145)
Total Bilirubin: 4.7 mg/dL — ABNORMAL HIGH (ref 0.3–1.2)
Total Protein: 6.7 g/dL (ref 6.5–8.1)

## 2017-06-02 MED ORDER — MAGNESIUM SULFATE 2 GM/50ML IV SOLN
2.0000 g | Freq: Once | INTRAVENOUS | Status: AC
  Administered 2017-06-02: 2 g via INTRAVENOUS
  Filled 2017-06-02: qty 50

## 2017-06-02 MED ORDER — SORBITOL 70 % SOLN: 960.0000 mL | TOPICAL_OIL | Freq: Once | ORAL | Status: DC

## 2017-06-02 NOTE — ED Notes (Signed)
Report given to [facility] and notified her that I was in process of accessing pt's port. RN Brooke to access port.

## 2017-06-02 NOTE — Discharge Instructions (Signed)
In addition to increasing the calcium in your diet I would like you to take 1 Tums 3 times a day for the next month.  Please follow-up with your regular doctor in the next 2 to 3 days.

## 2017-06-02 NOTE — ED Notes (Signed)
Blood draw x 1 attempted by Opal Sidles, RN, unsuccessfully  Pt unable to tolerate blood draw by this RN.  Will draw blood from port when pt placed in room.

## 2017-06-02 NOTE — ED Provider Notes (Signed)
[facility] Emergency Department Provider Note   ____________________________________________   First MD Initiated Contact with Patient 06/02/17 1812     (approximate)  I have reviewed the triage vital signs and the nursing notes.   HISTORY  Chief Complaint Arm Pain and Nausea    HPI Amber Christensen is a 52 y.o. female patient reports she is been feeling ill for about 3 weeks she had to be put on fluid pills and prednisone for vasculitis in her legs and she began having some abdominal swelling.  CT yesterday showed a lot of stool and gas in her tummy.  Today she had some burning in her chest that sharp pain went down her left arm she began having dry heaves and as she was walking from the bathroom to the bed she got very lightheaded and leaned against the wall and stopped being able to see for just a few seconds she did not pass out did not fall down but for a few seconds she said she could not see.  Her vision came back was blurry for a few seconds and now it is normal again.  Additionally patient complains of a lot of cramping in her legs.   Past Medical History:  Diagnosis Date  . Allergy   . Anemia   . Anxiety   . 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[place])   . Blood transfusion without reported diagnosis   . Cervical cancer ([place])   . Clotting disorder ([place])   . Depression   . Diabetes mellitus without complication ([place])   . GERD (gastroesophageal reflux disease)   . Heart disease   . 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[place])   . Hypertension   . Liver disease   . Pancreatitis   . Thyroid disease     Patient Active Problem List   Diagnosis Date Noted  . Hypertension 03/21/2017  . Diabetes mellitus without complication ([place]) 99/37/1696  . Pancreatitis 03/21/2017  . Poor venous access 03/21/2017  . Vasculitis ([place]) 08/03/2016  . Acute maculopapular rash 04/21/2016  . Maculopapular rash, generalized 04/20/2016  . Unstable angina ([place]) 09/28/2015    Past Surgical History:   Procedure Laterality Date  . ABDOMINAL HYSTERECTOMY    . APPENDECTOMY    . BLADDER SURGERY    . BOWEL RESECTION    . BREAST BIOPSY Left 1989  . CHOLECYSTECTOMY    . CYST REMOVAL HAND Left 1992   Ganglion cyst removed from Left Wrist  . DILATATION & CURETTAGE/HYSTEROSCOPY WITH MYOSURE    . KNEE ARTHROSCOPY Left 1992  . PANCREAS SURGERY    . PORTA CATH INSERTION N/A 04/10/2017   Procedure: PORTA CATH INSERTION;  Surgeon: Algernon Huxley, MD;  Location: [facility];  Service: Cardiovascular;  Laterality: N/A;  . REPAIR OF ESOPHAGUS  2012   3 clamps placed in esophagus    Prior to Admission medications   Medication Sig Start Date End Date Taking? Authorizing Provider  amLODipine (NORVASC) 5 MG tablet Take 5 mg by mouth daily.    [provider]  aspirin EC 81 MG tablet Take 81 mg by mouth daily.    [provider]  Calcium Carb-Cholecalciferol (CALCIUM 600/VITAMIN D3 PO) Take 1 tablet by mouth daily.    [provider]  cloNIDine (CATAPRES) 0.1 MG tablet Take 0.1 mg by mouth daily.    [provider]  colestipol (COLESTID) 1 g tablet Take 2 g by mouth daily.    [provider]  hydrochlorothiazide (HYDRODIURIL) 12.5 MG tablet  Take by mouth. 05/22/17   [provider]  lipase/protease/amylase (CREON) 36000 UNITS CPEP capsule Take 108,000 Units by mouth 3 (three) times daily before meals.    [provider]  lisinopril (PRINIVIL,ZESTRIL) 40 MG tablet Take 40 mg by mouth daily.    [provider]  NOVOLOG 100 UNIT/ML injection 15-20 Units by Other route See admin instructions. Use up to 15-20 units daily via insulin pump. 07/24/16   [provider]  oxyCODONE (ROXICODONE) 5 MG immediate release tablet Take 1 tablet (5 mg total) by mouth every 8 (eight) hours as needed. Patient not taking: Reported on 05/29/2017 01/06/17 01/06/18  Nance Pear, MD  pantoprazole (PROTONIX) 40 MG tablet Take 1 tablet (40 mg  total) by mouth 2 (two) times daily. Patient taking differently: Take 40 mg by mouth daily.  09/29/15   Henreitta Leber, MD  vitamin E 400 UNIT capsule Take 400 Units by mouth daily.    [provider]    Allergies Metoclopramide; Morphine and related; Morpholine salicylate; Latex; Demerol [meperidine]; Dilaudid [hydromorphone hcl]; Isosorbide nitrate; Sulfa antibiotics; Darvon [propoxyphene]; Flexeril [cyclobenzaprine]; Levaquin [levofloxacin in d5w]; Nsaids; Soma [carisoprodol]; and Zofran [ondansetron hcl]  Family History  Problem Relation Age of Onset  . Cirrhosis Mother   . Colon cancer Mother 6       TAH/BSO in ~26; deceased at 62  . Hypertension Mother   . Breast cancer Mother 27       unconfirmed  . Hypertension Father   . Prostate cancer Father 37       currently 89  . Other Father        liver disease  . Breast cancer Maternal Aunt        age at dx unk.; currently 22  . Cervical cancer Maternal Aunt   . Cancer Maternal Uncle        unk. type; currently 79s  . Stomach cancer Paternal Aunt 66       deceased at 57  . Breast cancer Maternal Grandmother 26       possibly bilateral in 53s; deceased 67  . Non-Hodgkin's lymphoma Daughter        unconfirmed; currently 72  . Cancer Maternal Aunt        hematologic malignancy; deceased 66    Social History Social History   Tobacco Use  . Smoking status: Never Smoker  . Smokeless tobacco: Never Used  Substance Use Topics  . Alcohol use: No  . Drug use: No    Review of Systems  Constitutional: No fever/chills Eyes: No visual changes. ENT: No sore throat. Cardiovascular: Denies chest pain. Respiratory: Denies shortness of breath. Gastrointestinal: No abdominal pain.  No nausea, no vomiting.  No diarrhea.  No constipation. Genitourinary: Negative for dysuria. Musculoskeletal: Negative for back pain. Skin: Negative for rash. Neurological: Negative for headaches, focal weakness   ____________________________________________   PHYSICAL EXAM:  VITAL SIGNS: ED Triage Vitals  Enc Vitals Group     BP 06/02/17 1429 (!) 156/95     Pulse Rate 06/02/17 1429 (!) 101     Resp 06/02/17 1429 16     Temp 06/02/17 1429 98.6 F (37 C)     Temp Source 06/02/17 1429 Oral     SpO2 06/02/17 1429 96 %     Weight 06/02/17 1427 136 lb (61.7 kg)     Height 06/02/17 1427 5' 3"  (1.6 m)     Head Circumference --      Peak Flow --  Pain Score 06/02/17 1427 10     Pain Loc --      Pain Edu? --      Excl. in [place]? --     Constitutional: Alert and oriented. Well appearing and in no acute distress. Eyes: Conjunctivae are normal.  Head: Atraumatic. Nose: No congestion/rhinnorhea. Mouth/Throat: Mucous membranes are moist.  Oropharynx non-erythematous. Neck: No stridor.  Cardiovascular: Normal rate, regular rhythm. Grossly normal heart sounds.  Good peripheral circulation. Respiratory: Normal respiratory effort.  No retractions. Lungs CTAB. Gastrointestinal: Soft she does have some mild left upper quadrant abdominal tenderness to palpation only no distention. No abdominal bruits. No CVA tenderness. Musculoskeletal: No lower extremity tenderness nor edema.  No joint effusions. Neurologic:  Normal speech and language. No gross focal neurologic deficits are appreciated Skin:  Skin is warm, dry and intact. No rash noted. Psychiatric: Mood and affect are normal. Speech and behavior are normal.  ____________________________________________   LABS (all labs ordered are listed, but only abnormal results are displayed)  Labs Reviewed  COMPREHENSIVE METABOLIC PANEL - Abnormal; Notable for the following components:      Result Value   Glucose, Bld 231 (*)    Calcium 7.8 (*)    AST 47 (*)    ALT 63 (*)    Total Bilirubin 4.7 (*)    All other components within normal limits  MAGNESIUM - Abnormal; Notable for the following components:   Magnesium 1.3 (*)    All other components  within normal limits  CBC  TROPONIN I  LIPASE, BLOOD  SEDIMENTATION RATE  PREGNANCY, URINE   ____________________________________________  EKG  EKG read interpreted by me shows normal sinus rhythm rate of 99 normal axis computer is reading possible left atrial enlargement this is possible but the P waves do not look horribly unusual ____________________________________________  RADIOLOGY  ED MD interpretation: Test x-ray shows no acute pathology there is a port in place CT scan yesterday showed a lot of stool in the colon  Official radiology report(s): Dg Chest 2 View  Result Date: 06/02/2017 CLINICAL DATA:  Pain in the center chest and LEFT arm. Nausea, vomiting. Blackout for 30 seconds. Symptoms started today. Abdominal pain and swelling yesterday. EXAM: CHEST - 2 VIEW COMPARISON:  03/19/2017 FINDINGS: RIGHT-sided PowerPort tip to the superior vena cava. Heart size is normal. There are no focal consolidations or pleural effusions. No pulmonary edema. Surgical clips are present in the RIGHT UPPER QUADRANT of the abdomen. IMPRESSION: No evidence for acute cardiopulmonary abnormality. Electronically Signed   By: Nolon Nations M.D.   On: 06/02/2017 15:18   Dg Abdomen 1 View  Result Date: 06/02/2017 CLINICAL DATA:  Nausea today. EXAM: ABDOMEN - 1 VIEW COMPARISON:  CT abdomen and pelvis 06/01/2017. FINDINGS: Bowel gas pattern is nonobstructive. There is a very large volume of stool throughout the colon. No abnormal abdominal calcification. Cholecystectomy clips noted. IMPRESSION: No acute finding. Massive colonic stool burden throughout. Electronically Signed   By: Inge Rise M.D.   On: 06/02/2017 19:04    ____________________________________________   PROCEDURES  Procedure(s) performed:   Procedures  Critical Care performed:  ____________________________________________   INITIAL IMPRESSION / ASSESSMENT AND PLAN / ED COURSE  Will try an enema and iv magnesium. Pt will  follow up with her doctor.         ____________________________________________   FINAL CLINICAL IMPRESSION(S) / ED DIAGNOSES  Final diagnoses:  Nausea  Hypocalcemia     ED Discharge Orders    None  Note:  This document was prepared using Dragon voice recognition software and may include unintentional dictation errors.    Nena Polio, MD 06/02/17 2046

## 2017-06-02 NOTE — ED Notes (Signed)
Attempted blood drawn, pt was unable to keep hand still and needle came out. Will gather supplies and access port at this time

## 2017-06-02 NOTE — ED Triage Notes (Signed)
Pt to ED via POV c/o left arm pain, pt states that it has been going on for a while but has gotten worse today. Pt also states that prior to coming in she had an episode where everything went black. Pt also having nausea. Pt in NAD at this time.

## 2017-06-06 DIAGNOSIS — R5382 Chronic fatigue, unspecified: Secondary | ICD-10-CM | POA: Insufficient documentation

## 2017-06-06 DIAGNOSIS — R6881 Early satiety: Secondary | ICD-10-CM | POA: Insufficient documentation

## 2017-06-07 ENCOUNTER — Other Ambulatory Visit
Admission: RE | Admit: 2017-06-07 | Discharge: 2017-06-07 | Disposition: A | Discharge status: Home / Self Care | Payer: Medicare Other | Source: Ambulatory Visit | Attending: Gastroenterology | Admitting: Gastroenterology

## 2017-06-07 DIAGNOSIS — K7460 Unspecified cirrhosis of liver: Secondary | ICD-10-CM | POA: Diagnosis present

## 2017-06-07 LAB — PROTIME-INR
INR: 1.04
Prothrombin Time: 13.5 seconds (ref 11.4–15.2)

## 2017-06-07 LAB — COMPREHENSIVE METABOLIC PANEL
ALT: 69 U/L — ABNORMAL HIGH (ref 14–54)
AST: 47 U/L — ABNORMAL HIGH (ref 15–41)
Albumin: 4.8 g/dL (ref 3.5–5.0)
Alkaline Phosphatase: 144 U/L — ABNORMAL HIGH (ref 38–126)
Anion gap: 9 (ref 5–15)
BUN: 16 mg/dL (ref 6–20)
CO2: 29 mmol/L (ref 22–32)
Calcium: 9.2 mg/dL (ref 8.9–10.3)
Chloride: 98 mmol/L — ABNORMAL LOW (ref 101–111)
Creatinine, Ser: 0.58 mg/dL (ref 0.44–1.00)
GFR calc Af Amer: >60 mL/min (ref >60)
GFR calc non Af Amer: >60 mL/min (ref >60)
Glucose, Bld: 149 mg/dL — ABNORMAL HIGH (ref 65–99)
Potassium: 3.5 mmol/L (ref 3.5–5.1)
Sodium: 136 mmol/L (ref 135–145)
Total Bilirubin: 4.7 mg/dL — ABNORMAL HIGH (ref 0.3–1.2)
Total Protein: 7.9 g/dL (ref 6.5–8.1)

## 2017-06-07 LAB — MAGNESIUM: Magnesium: 1.4 mg/dL — ABNORMAL LOW (ref 1.7–2.4)

## 2017-06-09 ENCOUNTER — Telehealth: Payer: Self-pay | Admitting: Genetic Counselor

## 2017-06-09 NOTE — Telephone Encounter (Signed)
Cancer Genetics             Telegenetics Results Disclosure   Patient Name: Amber Christensen Patient DOB: November 06, 1965 Patient Age: 52 y.o. Phone Call Date: 06/09/2017  Referring Provider: Earlie Server, MD    Ms. Hari was called today to discuss genetic test results. Please see the Genetics telephone note from 05/30/2017 for a detailed discussion of her personal and family histories and the recommendations provided.  Genetic Testing: At the time of Amber Christensen's telegenetics visit, she decided to pursue genetic testing of multiple genes associated with hereditary susceptibility to cancer. Testing included sequencing and deletion/duplication analysis. Testing did not reveal a pathogenic mutation in any of the genes analyzed.  A copy of the genetic test report will be scanned into Epic under the Media tab.  The genes analyzed were the 83 genes on [facility]'s Multi-Cancer panel (ALK, APC, ATM, AXIN2, BAP1, BARD1, BLM, BMPR1A, BRCA1, BRCA2, BRIP1, CASR, CDC73, CDH1, CDK4, CDKN1B, CDKN1C, CDKN2A, CEBPA, CHEK2, CTNNA1, DICER1, DIS3L2, EGFR, EPCAM, FH, FLCN, GATA2, GPC3, GREM1, HOXB13, HRAS, KIT, MAX, MEN1, MET, MITF, MLH1, MSH2, MSH3, MSH6, MUTYH, NBN, NF1, NF2, NTHL1, PALB2, PDGFRA, PHOX2B, PMS2, POLD1, POLE, POT1, PRKAR1A, PTCH1, PTEN, RAD50, RAD51C, RAD51D, RB1, RECQL4, RET, RUNX1, SDHA, SDHAF2, SDHB, SDHC, SDHD, SMAD4, SMARCA4, SMARCB1, SMARCE1, STK11, SUFU, TERC, TERT, TMEM127, TP53, TSC1, TSC2, VHL, WRN, WT1).  Since the current test is not perfect, it is possible that there may be a gene mutation that current testing cannot detect, but that chance is small. It is possible that a different genetic factor, which has not yet been discovered or is not on this panel, is responsible for the cancer diagnoses in the family. Again, the likelihood of this is low. Lastly, it may be that there is a detectable mutation in her family that she did not inherit. No additional testing is recommended  at this time for Amber Christensen.  Cancer Screening: These results are reassuring and indicate that Amber Christensen does not likely have an increased risk of cancer due to a mutation in one of these genes. The [employer]  recommends that women follow the breast screening recommendations below, but that these may need to be modified based on other risk factors such as dense breasts, biopsy history or family history. Of note, Amber Christensen has not had a mammogram in over 10 years.  Breast awareness - Women should be familiar with their breasts and promptly report changes to their healthcare provider.   Starting at age 58: Breast exam, risk assessment, and risk reduction counseling by the provider and mammogram every year. The provider may discuss screening with tomosynthesis.  Amber Christensen is also recommended to undergo a yearly gynecologic exam and initiate colon cancer screening since she has not yet done so.  Family Members: Family members are at some increased risk of developing cancer, over the general population risk, simply due to the family history. They are recommended to speak with their own providers about appropriate cancer screenings.  Any relative who had cancer at a young age or had a particularly rare cancer may also wish to pursue genetic testing. Genetic counselors can be located in other cities, by visiting the website of the [employer] ([employer]ist[employer]se) and Field seismologist for a Dietitian by zip code.    Lastly, cancer genetics is a rapidly advancing field and it is  possible that new genetic tests will be appropriate for Amber Christensen in the future. We encourage Amber Christensen to remain in contact with Genetics on an annual basis so her personal and family histories can be updated.    Steele Berg, MS, [place] Certified Genetic Counselor phone: [phone]

## 2017-06-12 ENCOUNTER — Other Ambulatory Visit
Admission: RE | Admit: 2017-06-12 | Discharge: 2017-06-12 | Disposition: A | Discharge status: Home / Self Care | Payer: Medicare Other | Source: Ambulatory Visit | Attending: Internal Medicine | Admitting: Internal Medicine

## 2017-06-12 ENCOUNTER — Other Ambulatory Visit: Payer: Self-pay | Admitting: Internal Medicine

## 2017-06-12 DIAGNOSIS — Z1239 Encounter for other screening for malignant neoplasm of breast: Secondary | ICD-10-CM

## 2017-06-12 LAB — MAGNESIUM: Magnesium: 1.4 mg/dL — ABNORMAL LOW (ref 1.7–2.4)

## 2017-06-13 LAB — CALCITRIOL (1,25 DI-OH VIT D): Vit D, 1,25-Dihydroxy: 45.8 pg/mL (ref 19.9–79.3)

## 2017-06-13 LAB — PTH, INTACT AND CALCIUM
Calcium, Total (PTH): 8.8 mg/dL (ref 8.7–10.2)
PTH: 32 pg/mL (ref 15–65)

## 2017-06-13 LAB — CALCIUM, IONIZED: Calcium, Ionized, Serum: 4.5 mg/dL (ref 4.5–5.6)

## 2017-06-13 LAB — VITAMIN D 25 HYDROXY (VIT D DEFICIENCY, FRACTURES): Vit D, 25-Hydroxy: 25.6 ng/mL — ABNORMAL LOW (ref 30.0–100.0)

## 2017-06-28 IMAGING — RF DG UGI W/ SMALL BOWEL
10 of 20 series · 12 of 24 positions shown · non-contrast
Comparison: None.

CLINICAL DATA: Evaluation of UGI with small bowel. 2.4 FT. Patient
has a HX of ERCP with complications, diabetes, multiple abdominal
surgeries, NASH, pancreatitis, HTN, superficial vein thrombosis, and
ulcers.

EXAM:
UPPER GI SERIES WITH SMALL BOWEL FOLLOW-THROUGH
FLUOROSCOPY TIME:  Radiation Exposure Index (if provided by the
fluoroscopic device): 19.3 mGy
TECHNIQUE: Combined double contrast and single contrast upper GI series using
effervescent crystals, thick barium, and thin barium. Subsequently,
serial images of the small bowel were obtained including spot views
of the terminal ileum.

[Series 2: cp_standard · 0.50mm/px · 2 of 50 frames shown (1 of 10)]
[frame 5/50]
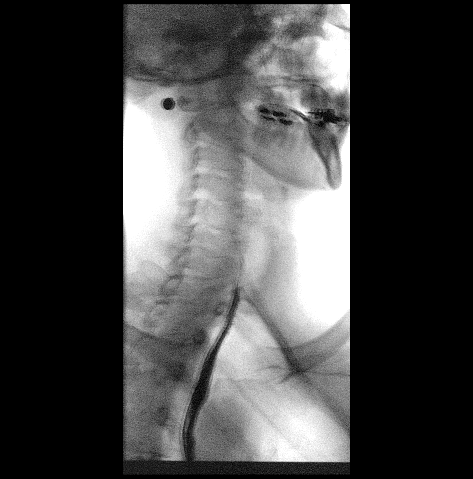
[frame 43/50]
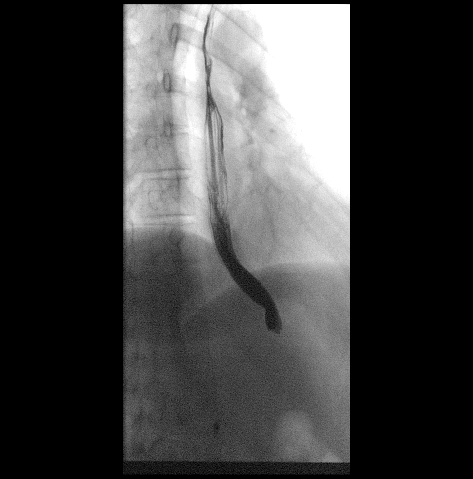

[Series 5: cp_standard · 0.52mm/px · 2 acquisitions, 2 frames shown (2 of 10)]
[im 1/2]
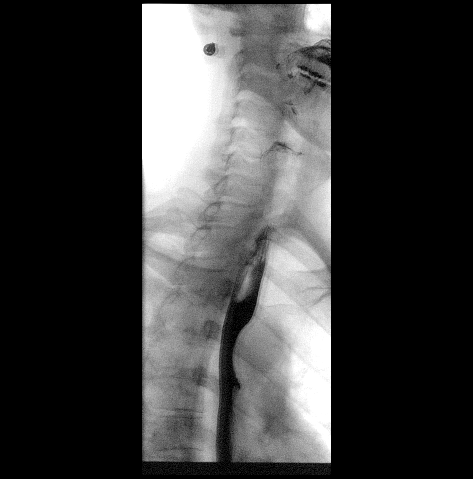
[im 1/2]
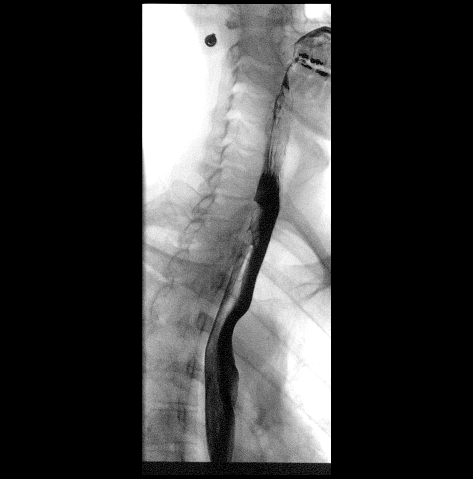

[Series 7: cp_standard · 0.26mm/px · 1 of 1 slices shown (3 of 10)]
[im 1/1]
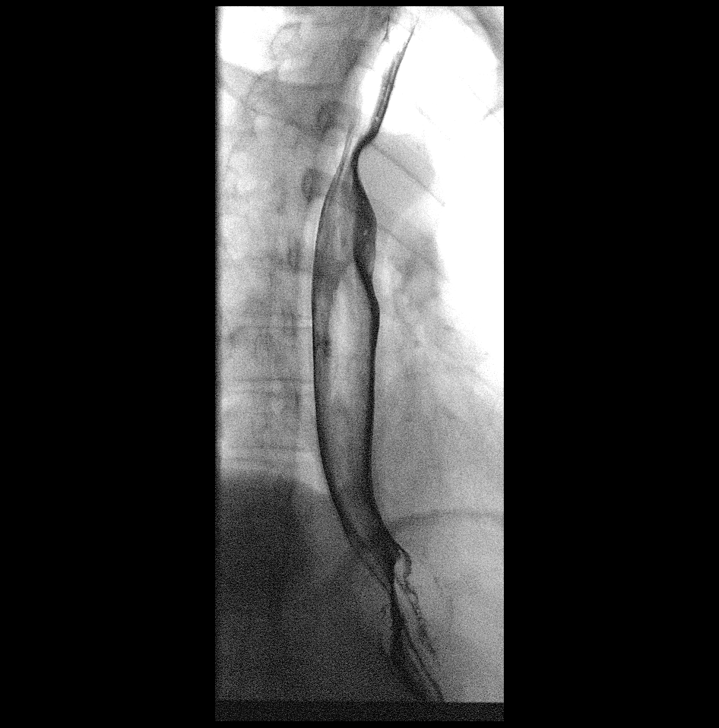

[Series 10: cp_standard · 0.27mm/px · 1 of 1 slices shown (4 of 10)]
[im 1/1]
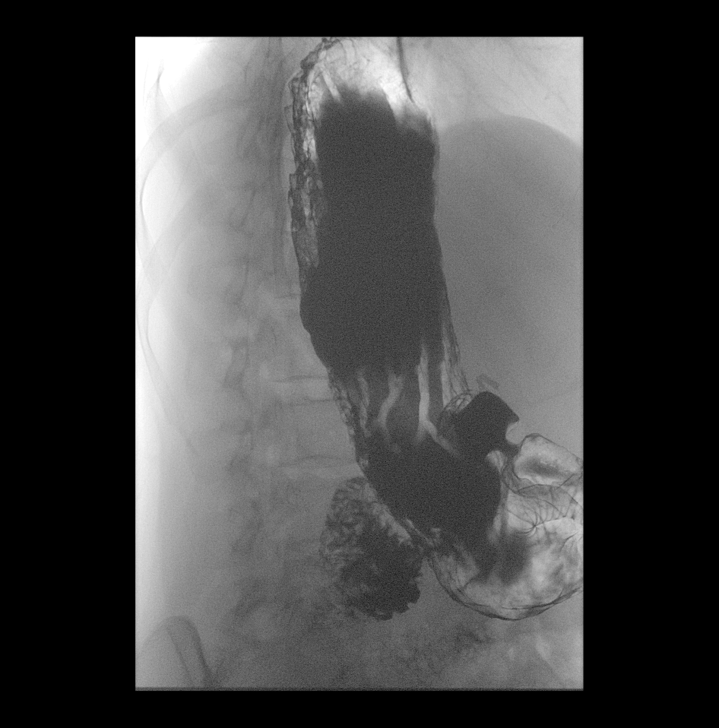

[Series 12: cp_standard · 0.27mm/px · 1 of 1 slices shown (5 of 10)]
[im 1/1]
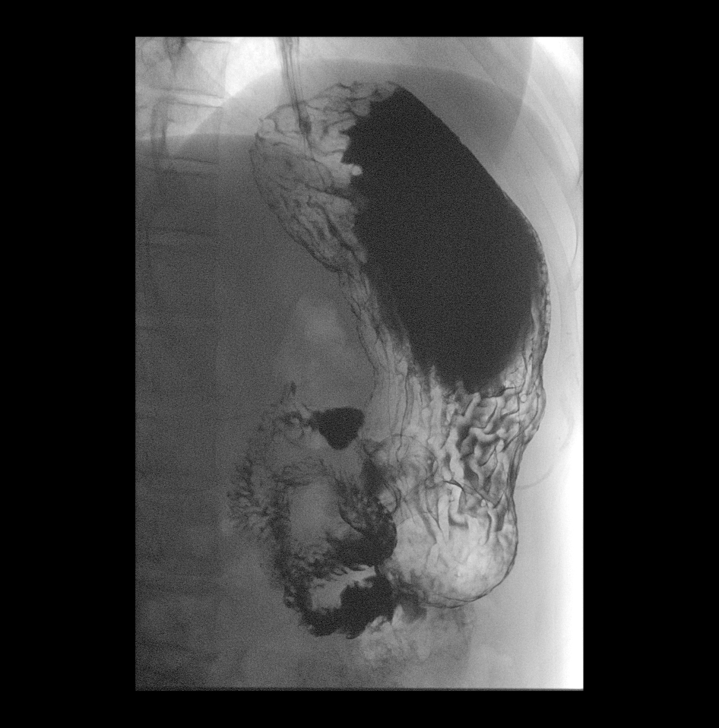

[Series 15: cp_standard · 0.28mm/px · 1 of 1 slices shown (6 of 10)]
[im 1/1]
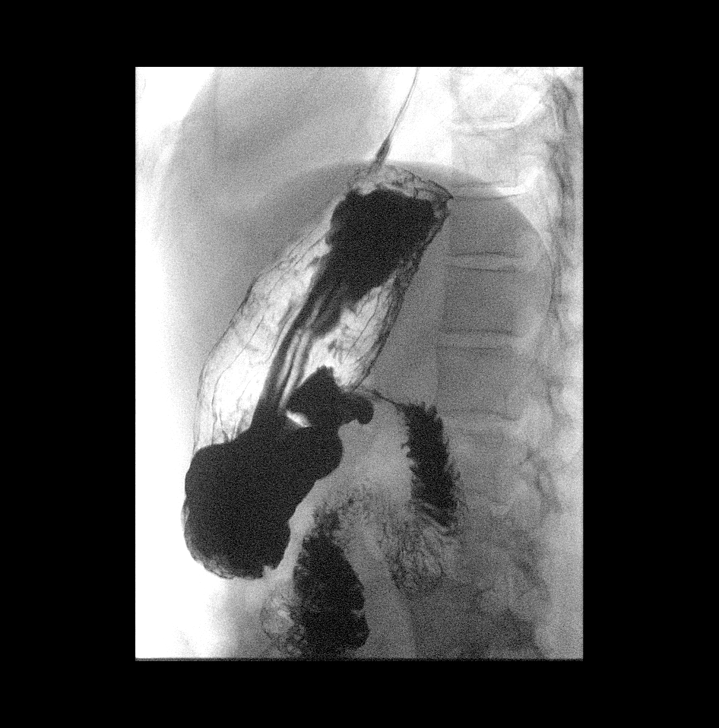

[Series 17: cp_standard · 0.28mm/px · 1 of 1 slices shown (7 of 10)]
[im 1/1]
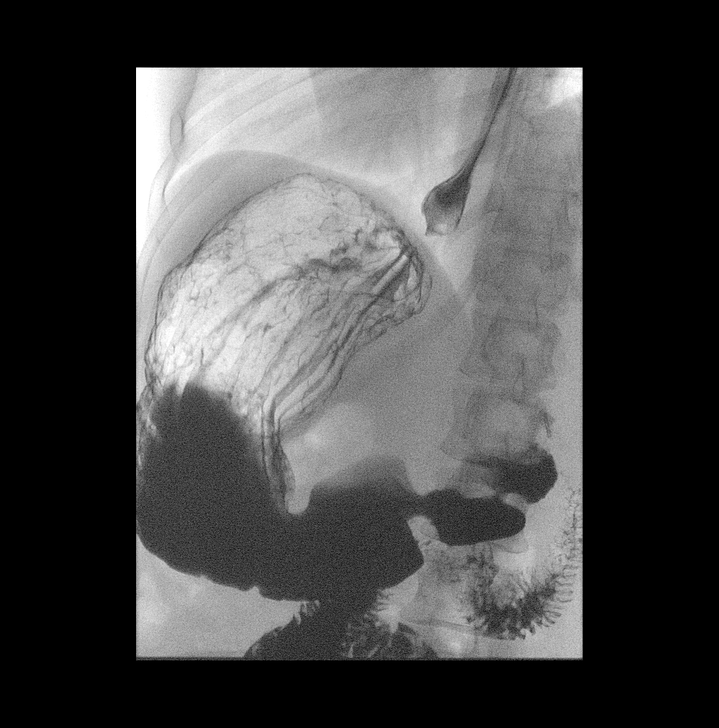

[Series 20: cp_standard · 0.28mm/px · 1 of 1 slices shown (8 of 10)]
[im 1/1]
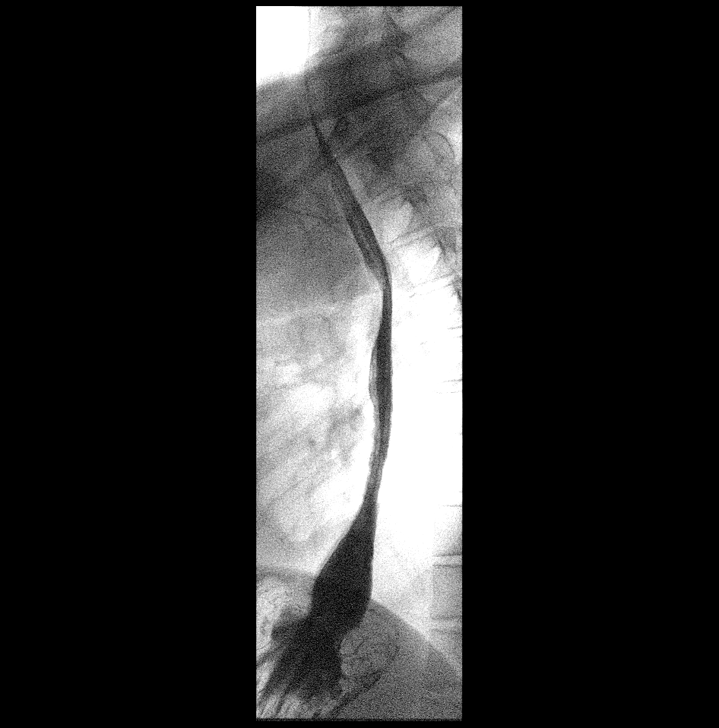

[Series 22: cp_standard · 0.28mm/px · 1 of 1 slices shown (9 of 10)]
[im 1/1]
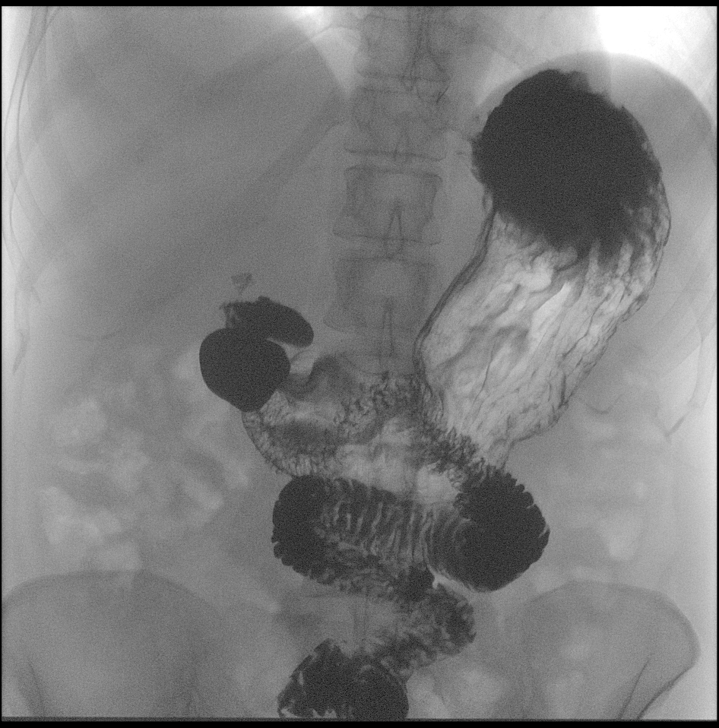

[Series 25: cp_standard · 0.27mm/px · 1 of 1 slices shown (10 of 10)]
[im 1/1]
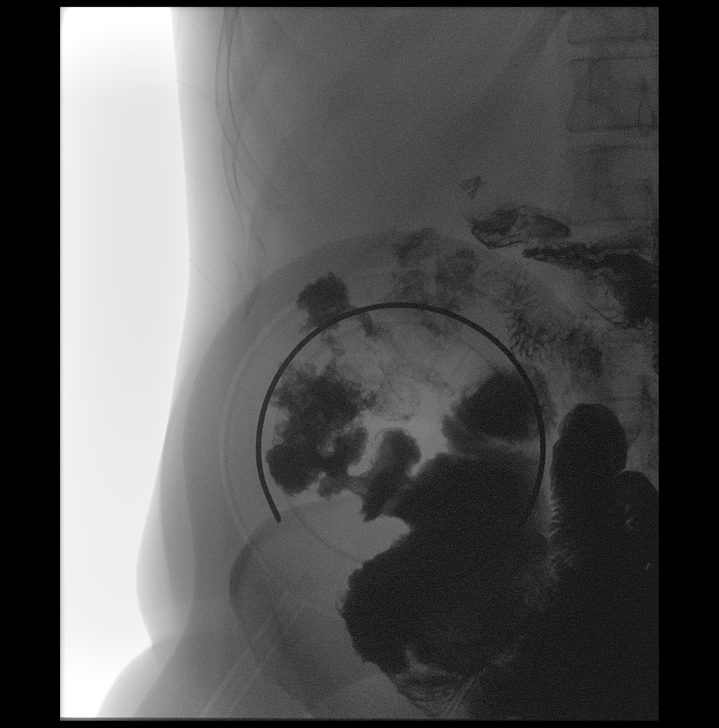

[12 of 24 positions shown; findings below may reference images not displayed]

FINDINGS: KUB: There is no bowel dilatation to suggest obstruction. There is
no evidence of pneumoperitoneum, portal venous gas or pneumatosis.

There are no pathologic calcifications along the expected course of
the ureters.

The osseous structures are unremarkable.

Upper GI small bowel follow-through:

Examination of the esophagus demonstrated normal esophageal
motility. Normal esophageal morphology without evidence of
esophagitis or ulceration. No esophageal stricture, diverticula, or
mass lesion. No evidence of hiatal hernia. There is mild
gastroesophageal reflux.

Examination of the stomach demonstrated normal rugal folds and areae
gastricae. The gastric mucosa appeared unremarkable without evidence
of ulceration, scarring, or mass lesion. Gastric motility and
emptying was normal. Fluoroscopic examination of the duodenum
demonstrates normal motility and morphology without evidence of
ulceration or mass lesion.

Medium density barium was periodically observed under fluoroscopy to
travel from the stomach to the ascending colon (over a 45 minute
time period). There is no evidence of small bowel stricture or
obstruction. No large filling defects to suggest mass lesion. In
addition, there is no evidence of tethering or definite inflammatory
changes present within the small bowel.
IMPRESSION: 1. Mild gastroesophageal reflux.

2.  Normal small bowel follow-through.

## 2017-07-07 ENCOUNTER — Other Ambulatory Visit: Payer: Self-pay | Admitting: Gastroenterology

## 2017-07-07 DIAGNOSIS — R110 Nausea: Secondary | ICD-10-CM

## 2017-07-07 DIAGNOSIS — R1084 Generalized abdominal pain: Secondary | ICD-10-CM

## 2017-07-18 ENCOUNTER — Ambulatory Visit
Admission: RE | Admit: 2017-07-18 | Discharge: 2017-07-18 | Disposition: A | Discharge status: Home / Self Care | Payer: Medicare Other | Source: Ambulatory Visit | Attending: Gastroenterology | Admitting: Gastroenterology

## 2017-07-18 DIAGNOSIS — R110 Nausea: Secondary | ICD-10-CM | POA: Insufficient documentation

## 2017-07-18 DIAGNOSIS — R1084 Generalized abdominal pain: Secondary | ICD-10-CM | POA: Insufficient documentation

## 2017-07-21 ENCOUNTER — Encounter: Payer: Self-pay | Admitting: Emergency Medicine

## 2017-07-21 ENCOUNTER — Emergency Department
Admission: EM | Admit: 2017-07-21 | Discharge: 2017-07-21 | Disposition: A | Discharge status: Home / Self Care | Payer: Medicare Other | Attending: Emergency Medicine | Admitting: Emergency Medicine

## 2017-07-21 ENCOUNTER — Other Ambulatory Visit: Payer: Self-pay

## 2017-07-21 DIAGNOSIS — E860 Dehydration: Secondary | ICD-10-CM | POA: Insufficient documentation

## 2017-07-21 DIAGNOSIS — Z7982 Long term (current) use of aspirin: Secondary | ICD-10-CM | POA: Insufficient documentation

## 2017-07-21 DIAGNOSIS — Z8541 Personal history of malignant neoplasm of cervix uteri: Secondary | ICD-10-CM | POA: Insufficient documentation

## 2017-07-21 DIAGNOSIS — I10 Essential (primary) hypertension: Secondary | ICD-10-CM | POA: Diagnosis not present

## 2017-07-21 DIAGNOSIS — R739 Hyperglycemia, unspecified: Secondary | ICD-10-CM

## 2017-07-21 DIAGNOSIS — R1084 Generalized abdominal pain: Secondary | ICD-10-CM | POA: Insufficient documentation

## 2017-07-21 DIAGNOSIS — Z794 Long term (current) use of insulin: Secondary | ICD-10-CM | POA: Insufficient documentation

## 2017-07-21 DIAGNOSIS — E1165 Type 2 diabetes mellitus with hyperglycemia: Secondary | ICD-10-CM | POA: Insufficient documentation

## 2017-07-21 DIAGNOSIS — Z91040 Latex allergy status: Secondary | ICD-10-CM | POA: Diagnosis not present

## 2017-07-21 LAB — COMPREHENSIVE METABOLIC PANEL
ALT: 49 U/L (ref 14–54)
AST: 34 U/L (ref 15–41)
Albumin: 4.1 g/dL (ref 3.5–5.0)
Alkaline Phosphatase: 119 U/L (ref 38–126)
Anion gap: 11 (ref 5–15)
BUN: 16 mg/dL (ref 6–20)
CO2: 25 mmol/L (ref 22–32)
Calcium: 8.3 mg/dL — ABNORMAL LOW (ref 8.9–10.3)
Chloride: 94 mmol/L — ABNORMAL LOW (ref 101–111)
Creatinine, Ser: 0.75 mg/dL (ref 0.44–1.00)
GFR calc Af Amer: >60 mL/min (ref >60)
GFR calc non Af Amer: >60 mL/min (ref >60)
Glucose, Bld: 425 mg/dL — ABNORMAL HIGH (ref 65–99)
Potassium: 3.8 mmol/L (ref 3.5–5.1)
Sodium: 130 mmol/L — ABNORMAL LOW (ref 135–145)
Total Bilirubin: 5.6 mg/dL — ABNORMAL HIGH (ref 0.3–1.2)
Total Protein: 6.9 g/dL (ref 6.5–8.1)

## 2017-07-21 LAB — URINALYSIS, COMPLETE (UACMP) WITH MICROSCOPIC
Bacteria, UA: NONE SEEN
Bilirubin Urine: NEGATIVE
Glucose, UA: >=500 mg/dL — AB
Hgb urine dipstick: NEGATIVE
Ketones, ur: 5 mg/dL — AB
Leukocytes, UA: NEGATIVE
Nitrite: NEGATIVE
Protein, ur: NEGATIVE mg/dL
Specific Gravity, Urine: 1.015 (ref 1.005–1.030)
pH: 5 (ref 5.0–8.0)

## 2017-07-21 LAB — GLUCOSE, CAPILLARY
Glucose-Capillary: 454 mg/dL — ABNORMAL HIGH (ref 65–99)
Glucose-Capillary: 139 mg/dL — ABNORMAL HIGH (ref 65–99)

## 2017-07-21 LAB — CBC WITH DIFFERENTIAL/PLATELET
Basophils Absolute: 0 10*3/uL (ref 0–0.1)
Basophils Relative: 0 %
Eosinophils Absolute: 0.1 10*3/uL (ref 0–0.7)
Eosinophils Relative: 1 %
HCT: 38 % (ref 35.0–47.0)
Hemoglobin: 13.6 g/dL (ref 12.0–16.0)
Lymphocytes Relative: 28 %
Lymphs Abs: 2 10*3/uL (ref 1.0–3.6)
MCH: 32.1 pg (ref 26.0–34.0)
MCHC: 35.8 g/dL (ref 32.0–36.0)
MCV: 89.6 fL (ref 80.0–100.0)
Monocytes Absolute: 0.4 10*3/uL (ref 0.2–0.9)
Monocytes Relative: 6 %
Neutro Abs: 4.6 10*3/uL (ref 1.4–6.5)
Neutrophils Relative %: 65 %
Platelets: 168 10*3/uL (ref 150–440)
RBC: 4.24 MIL/uL (ref 3.80–5.20)
RDW: 13.8 % (ref 11.5–14.5)
WBC: 7.2 10*3/uL (ref 3.6–11.0)

## 2017-07-21 LAB — LIPASE, BLOOD: Lipase: 20 U/L (ref 11–51)

## 2017-07-21 LAB — ETHANOL: Alcohol, Ethyl (B): <10 mg/dL (ref <10)

## 2017-07-21 MED ORDER — INSULIN ASPART 100 UNIT/ML ~~LOC~~ SOLN
10.0000 [IU] | Freq: Once | SUBCUTANEOUS | Status: AC
  Administered 2017-07-21: 10 [IU] via INTRAVENOUS
  Filled 2017-07-21: qty 1

## 2017-07-21 MED ORDER — SODIUM CHLORIDE 0.9 % IV BOLUS
2000.0000 mL | Freq: Once | INTRAVENOUS | Status: AC
  Administered 2017-07-21: 2000 mL via INTRAVENOUS

## 2017-07-21 MED ORDER — HEPARIN SOD (PORK) LOCK FLUSH 100 UNIT/ML IV SOLN
INTRAVENOUS | Status: AC
  Administered 2017-07-21: 500 [IU]
  Filled 2017-07-21: qty 5

## 2017-07-21 NOTE — Discharge Instructions (Addendum)
Your labs today were unremarkable. Continue taking your insulin and monitoring your blood sugar.

## 2017-07-21 NOTE — ED Triage Notes (Signed)
Ems pt from home, hyperglycemia x2 day , abd pain x1 week had a GI series on Monday, worsening abd pain on Tuesday.   EMS reports CBG 510, uses a insulin pump

## 2017-07-21 NOTE — ED Notes (Signed)
ED Provider at bedside. 

## 2017-07-21 NOTE — ED Provider Notes (Signed)
[facility] Emergency Department Provider Note  ____________________________________________  Time seen: Approximately 6:09 PM  I have reviewed the triage vital signs and the nursing notes.   HISTORY  Chief Complaint Hyperglycemia    HPI Amber Christensen is a 52 y.o. female with a history of brittle diabetes and GERD who reports hyperglycemia for the past 3 days at home, blood sugars about 400.she's been having worsening abdominal pain as well over the past 3 days, generalized, constant, waxing and waning, nonradiating, no aggravating or alleviating factors. Denies fevers chills or vomiting.      Past Medical History:  Diagnosis Date  . Allergy   . Anemia   . Anxiety   . 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 Blood transfusion without reported diagnosis   . Cervical cancer ([place])   . Clotting disorder ([place])   . Depression   . Diabetes mellitus without complication ([place])   . GERD (gastroesophageal reflux disease)   . Heart disease   . 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[place])   . Hypertension   . Liver disease   . Pancreatitis   . Thyroid disease      Patient Active Problem List   Diagnosis Date Noted  . Hypertension 03/21/2017  . Diabetes mellitus without complication ([place]) 65/68/1275  . Pancreatitis 03/21/2017  . Poor venous access 03/21/2017  . Vasculitis ([place]) 08/03/2016  . Acute maculopapular rash 04/21/2016  . Maculopapular rash, generalized 04/20/2016  . Unstable angina ([place]) 09/28/2015     Past Surgical History:  Procedure Laterality Date  . ABDOMINAL HYSTERECTOMY    . APPENDECTOMY    . BLADDER SURGERY    . BOWEL RESECTION    . BREAST BIOPSY Left 1989  . CHOLECYSTECTOMY    . CYST REMOVAL HAND Left 1992   Ganglion cyst removed from Left Wrist  . DILATATION & CURETTAGE/HYSTEROSCOPY WITH MYOSURE    . KNEE ARTHROSCOPY Left 1992  . PANCREAS SURGERY    . PORTA CATH INSERTION N/A 04/10/2017   Procedure: PORTA CATH INSERTION;  Surgeon: Algernon Huxley, MD;   Location: [facility];  Service: Cardiovascular;  Laterality: N/A;  . REPAIR OF ESOPHAGUS  2012   3 clamps placed in esophagus     Prior to Admission medications   Medication Sig Start Date End Date Taking? Authorizing Provider  amLODipine (NORVASC) 5 MG tablet Take 5 mg by mouth daily.    [provider]  aspirin EC 81 MG tablet Take 81 mg by mouth daily.    [provider]  Calcium Carb-Cholecalciferol (CALCIUM 600/VITAMIN D3 PO) Take 1 tablet by mouth daily.    [provider]  cloNIDine (CATAPRES) 0.1 MG tablet Take 0.1 mg by mouth daily.    [provider]  colestipol (COLESTID) 1 g tablet Take 2 g by mouth daily.    [provider]  hydrochlorothiazide (HYDRODIURIL) 12.5 MG tablet Take by mouth. 05/22/17   [provider]  lipase/protease/amylase (CREON) 36000 UNITS CPEP capsule Take 108,000 Units by mouth 3 (three) times daily before meals.    [provider]  lisinopril (PRINIVIL,ZESTRIL) 40 MG tablet Take 40 mg by mouth daily.    [provider]  NOVOLOG 100 UNIT/ML injection 15-20 Units by Other route See admin instructions. Use up to 15-20 units daily via insulin pump. 07/24/16   [provider]  oxyCODONE (ROXICODONE) 5 MG immediate release tablet Take 1 tablet (5 mg total) by mouth every 8 (eight) hours as needed. Patient not taking: Reported on  05/29/2017 01/06/17 01/06/18  Nance Pear, MD  pantoprazole (PROTONIX) 40 MG tablet Take 1 tablet (40 mg total) by mouth 2 (two) times daily. Patient taking differently: Take 40 mg by mouth daily.  09/29/15   Henreitta Leber, MD  vitamin E 400 UNIT capsule Take 400 Units by mouth daily.    [provider]     Allergies Metoclopramide; Morphine and related; Morpholine salicylate; Latex; Demerol [meperidine]; Dilaudid [hydromorphone hcl]; Isosorbide nitrate; Sulfa antibiotics; Darvon [propoxyphene]; Flexeril [cyclobenzaprine]; Levaquin  [levofloxacin in d5w]; Nsaids; Soma [carisoprodol]; and Zofran [ondansetron hcl]   Family History  Problem Relation Age of Onset  . Cirrhosis Mother   . Colon cancer Mother 7       TAH/BSO in ~54; deceased at 74  . Hypertension Mother   . Breast cancer Mother 59       unconfirmed  . Hypertension Father   . Prostate cancer Father 73       currently 33  . Other Father        liver disease  . Breast cancer Maternal Aunt        age at dx unk.; currently 6  . Cervical cancer Maternal Aunt   . Cancer Maternal Uncle        unk. type; currently 74s  . Stomach cancer Paternal Aunt 72       deceased at 63  . Breast cancer Maternal Grandmother 64       possibly bilateral in 71s; deceased 47  . Non-Hodgkin's lymphoma Daughter        unconfirmed; currently 12  . Cancer Maternal Aunt        hematologic malignancy; deceased 99    Social History Social History   Tobacco Use  . Smoking status: Never Smoker  . Smokeless tobacco: Never Used  Substance Use Topics  . Alcohol use: No  . Drug use: No    Review of Systems  Constitutional:   No fever or chills.  ENT:   No sore throat. No rhinorrhea. Cardiovascular:   No chest pain or syncope. Respiratory:   No dyspnea or cough. Gastrointestinal:   positive as above for abdominal pain without vomiting and diarrhea.  Musculoskeletal:   Negative for focal pain or swelling All other systems reviewed and are negative except as documented above in ROS and HPI.  ____________________________________________   PHYSICAL EXAM:  VITAL SIGNS: ED Triage Vitals  Enc Vitals Group     BP 07/21/17 1442 (!) 149/93     Pulse Rate 07/21/17 1442 89     Resp 07/21/17 1442 18     Temp 07/21/17 1442 97.8 F (36.6 C)     Temp Source 07/21/17 1442 Oral     SpO2 07/21/17 1442 96 %     Weight 07/21/17 1443 148 lb 10 oz (67.4 kg)     Height 07/21/17 1443 5' 3.5" (1.613 m)     Head Circumference --      Peak Flow --      Pain Score 07/21/17 1443 6      Pain Loc --      Pain Edu? --      Excl. in [place]? --     Vital signs reviewed, nursing assessments reviewed.   Constitutional:   Alert and oriented. nontoxic appearing Eyes:   Conjunctivae are normal. EOMI. PERRL. ENT      Head:   Normocephalic and atraumatic.      Nose:   No congestion/rhinnorhea.  Mouth/Throat:   dry mucous membranes, no pharyngeal erythema. No peritonsillar mass.       Neck:   No meningismus. Full ROM. Hematological/Lymphatic/Immunilogical:   No cervical lymphadenopathy. Cardiovascular:   RRR. Symmetric bilateral radial and DP pulses.  No murmurs.  Respiratory:   Normal respiratory effort without tachypnea/retractions. Breath sounds are clear and equal bilaterally. No wheezes/rales/rhonchi. Gastrointestinal:   Soft with epigastric tenderness. Non distended. There is no CVA tenderness.  No rebound, rigidity, or guarding.  Musculoskeletal:   Normal range of motion in all extremities. No joint effusions.  No lower extremity tenderness.  No edema. Neurologic:   Normal speech and language.  Motor grossly intact. No acute focal neurologic deficits are appreciated.  Skin:    Skin is warm, dry and intact. No rash noted.  No petechiae, purpura, or bullae.  ____________________________________________    LABS (pertinent positives/negatives) (all labs ordered are listed, but only abnormal results are displayed) Labs Reviewed  GLUCOSE, CAPILLARY - Abnormal; Notable for the following components:      Result Value   Glucose-Capillary 454 (*)    All other components within normal limits  COMPREHENSIVE METABOLIC PANEL - Abnormal; Notable for the following components:   Sodium 130 (*)    Chloride 94 (*)    Glucose, Bld 425 (*)    Calcium 8.3 (*)    Total Bilirubin 5.6 (*)    All other components within normal limits  URINALYSIS, COMPLETE (UACMP) WITH MICROSCOPIC - Abnormal; Notable for the following components:   Color, Urine STRAW (*)    APPearance CLEAR (*)     Glucose, UA >=500 (*)    Ketones, ur 5 (*)    All other components within normal limits  GLUCOSE, CAPILLARY - Abnormal; Notable for the following components:   Glucose-Capillary 139 (*)    All other components within normal limits  LIPASE, BLOOD  CBC WITH DIFFERENTIAL/PLATELET  ETHANOL   ____________________________________________   EKG    ____________________________________________    RADIOLOGY  No results found.  ____________________________________________   PROCEDURES Procedures  ____________________________________________    CLINICAL IMPRESSION / ASSESSMENT AND PLAN / ED COURSE  Pertinent labs & imaging results that were available during my care of the patient were reviewed by me and considered in my medical decision making (see chart for details).    patient presents with generalized abdominal pain, unremarkable vital signs, mild tenderness on exam. Labs show hyperglycemia but no DKA.patient given 2 L of fluid for hydration and 10 units of insulin, repeat blood sugar was 140. Patient is tolerating oral intake and suitable for discharge home.      ____________________________________________   FINAL CLINICAL IMPRESSION(S) / ED DIAGNOSES    Final diagnoses:  Hyperglycemia  Dehydration     ED Discharge Orders    None      Portions of this note were generated with dragon dictation software. Dictation errors may occur despite best attempts at proofreading.    Carrie Mew, MD 07/21/17 [phone]

## 2017-07-28 ENCOUNTER — Other Ambulatory Visit: Payer: Self-pay | Admitting: Gastroenterology

## 2017-07-28 DIAGNOSIS — Z1211 Encounter for screening for malignant neoplasm of colon: Secondary | ICD-10-CM

## 2017-08-06 ENCOUNTER — Emergency Department
Admission: EM | Admit: 2017-08-06 | Discharge: 2017-08-06 | Disposition: A | Discharge status: Home / Self Care | Payer: Medicare Other | Attending: Emergency Medicine | Admitting: Emergency Medicine

## 2017-08-06 DIAGNOSIS — I10 Essential (primary) hypertension: Secondary | ICD-10-CM | POA: Insufficient documentation

## 2017-08-06 DIAGNOSIS — Z794 Long term (current) use of insulin: Secondary | ICD-10-CM | POA: Insufficient documentation

## 2017-08-06 DIAGNOSIS — Z79899 Other long term (current) drug therapy: Secondary | ICD-10-CM | POA: Insufficient documentation

## 2017-08-06 DIAGNOSIS — E1365 Other specified diabetes mellitus with hyperglycemia: Secondary | ICD-10-CM | POA: Insufficient documentation

## 2017-08-06 DIAGNOSIS — Z90410 Acquired total absence of pancreas: Secondary | ICD-10-CM | POA: Diagnosis not present

## 2017-08-06 DIAGNOSIS — L03116 Cellulitis of left lower limb: Secondary | ICD-10-CM

## 2017-08-06 DIAGNOSIS — Z91040 Latex allergy status: Secondary | ICD-10-CM | POA: Diagnosis not present

## 2017-08-06 DIAGNOSIS — R739 Hyperglycemia, unspecified: Secondary | ICD-10-CM

## 2017-08-06 LAB — GLUCOSE, CAPILLARY
Glucose-Capillary: 579 mg/dL (ref 65–99)
Glucose-Capillary: 540 mg/dL (ref 65–99)
Glucose-Capillary: 223 mg/dL — ABNORMAL HIGH (ref 65–99)

## 2017-08-06 LAB — CBC
HCT: 39.1 % (ref 35.0–47.0)
Hemoglobin: 14.1 g/dL (ref 12.0–16.0)
MCH: 32.6 pg (ref 26.0–34.0)
MCHC: 36.2 g/dL — ABNORMAL HIGH (ref 32.0–36.0)
MCV: 90.2 fL (ref 80.0–100.0)
Platelets: 195 10*3/uL (ref 150–440)
RBC: 4.33 MIL/uL (ref 3.80–5.20)
RDW: 13 % (ref 11.5–14.5)
WBC: 7.7 10*3/uL (ref 3.6–11.0)

## 2017-08-06 LAB — URINALYSIS, COMPLETE (UACMP) WITH MICROSCOPIC
Bacteria, UA: NONE SEEN
Bilirubin Urine: NEGATIVE
Glucose, UA: >=500 mg/dL — AB
Hgb urine dipstick: NEGATIVE
Ketones, ur: 5 mg/dL — AB
Leukocytes, UA: NEGATIVE
Nitrite: NEGATIVE
Protein, ur: NEGATIVE mg/dL
Specific Gravity, Urine: 1.028 (ref 1.005–1.030)
pH: 6 (ref 5.0–8.0)

## 2017-08-06 LAB — BASIC METABOLIC PANEL
Anion gap: 12 (ref 5–15)
BUN: 22 mg/dL — ABNORMAL HIGH (ref 6–20)
CO2: 25 mmol/L (ref 22–32)
Calcium: 8.6 mg/dL — ABNORMAL LOW (ref 8.9–10.3)
Chloride: 89 mmol/L — ABNORMAL LOW (ref 101–111)
Creatinine, Ser: 0.99 mg/dL (ref 0.44–1.00)
GFR calc Af Amer: >60 mL/min (ref >60)
GFR calc non Af Amer: >60 mL/min (ref >60)
Glucose, Bld: 655 mg/dL (ref 65–99)
Potassium: 4.6 mmol/L (ref 3.5–5.1)
Sodium: 126 mmol/L — ABNORMAL LOW (ref 135–145)

## 2017-08-06 MED ORDER — CEPHALEXIN 500 MG PO CAPS: 500.0000 mg | ORAL_CAPSULE | Freq: Three times a day (TID) | ORAL | 0 refills | Status: AC

## 2017-08-06 MED ORDER — CEPHALEXIN 500 MG PO CAPS
500.0000 mg | ORAL_CAPSULE | Freq: Once | ORAL | Status: AC
  Administered 2017-08-06: 500 mg via ORAL
  Filled 2017-08-06: qty 1

## 2017-08-06 MED ORDER — INSULIN ASPART 100 UNIT/ML ~~LOC~~ SOLN
10.0000 [IU] | Freq: Once | SUBCUTANEOUS | Status: AC
  Administered 2017-08-06: 10 [IU] via INTRAVENOUS
  Filled 2017-08-06: qty 1

## 2017-08-06 MED ORDER — PROCHLORPERAZINE EDISYLATE 10 MG/2ML IJ SOLN
10.0000 mg | Freq: Once | INTRAMUSCULAR | Status: AC
  Administered 2017-08-06: 10 mg via INTRAVENOUS
  Filled 2017-08-06: qty 2

## 2017-08-06 MED ORDER — SODIUM CHLORIDE 0.9 % IV BOLUS
1000.0000 mL | Freq: Once | INTRAVENOUS | Status: AC
  Administered 2017-08-06: 1000 mL via INTRAVENOUS

## 2017-08-06 MED ORDER — HEPARIN SOD (PORK) LOCK FLUSH 100 UNIT/ML IV SOLN
500.0000 [IU] | Freq: Once | INTRAVENOUS | Status: AC
  Administered 2017-08-06: 500 [IU] via INTRAVENOUS
  Filled 2017-08-06: qty 5

## 2017-08-06 NOTE — ED Notes (Addendum)
Patient's port de-accessed and flushed with 500U Heparin lock.

## 2017-08-06 NOTE — ED Notes (Signed)
First Nurse Note: Pt to ED c/o high blood sugar. Pt in NAD at this time.

## 2017-08-06 NOTE — ED Provider Notes (Signed)
[facility] Emergency Department Provider Note  ____________________________________________  Time seen: Approximately 6:52 PM  I have reviewed the triage vital signs and the nursing notes.   HISTORY  Chief Complaint Hyperglycemia   HPI Amber Christensen is a 52 y.o. 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  Patient reports that her sugars have been elevated for a few days.  Patient uses her pump for all her insulin.  She is supposed to check her blood glucose between meals and at bedtime to adjust her sliding scale however she is not doing that and is just giving herself boluses with the meals.  She was recently seen by her endocrinologist 3 days ago who again reinforced the importance of doing this.  Her pump was switched for a new one in case there was any malfunctioning of the pump.  Her dosing has been adjusted by her endocrinologist as well.  Patient reports today she started having nausea and had to feel episodes of nonbloody nonbilious emesis which she usually gets when her sugars are very high.  She denies dysuria, fever, chills, chest pain, shortness of breath, URI symptoms, or acute abdominal pain.   Past Medical History:  Diagnosis Date  . Allergy   . Anemia   . Anxiety   . 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 Blood transfusion without reported diagnosis   . Cervical cancer ([place])   . Clotting disorder ([place])   . Depression   . Diabetes mellitus without complication ([place])   . GERD (gastroesophageal reflux disease)   . Heart disease   . 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 Hypertension   . Liver disease   . Pancreatitis   . Thyroid disease     Patient Active Problem List   Diagnosis Date Noted  . Hypertension 03/21/2017  . Diabetes mellitus without complication ([place]) 23/76/2831  . Pancreatitis 03/21/2017  . Poor venous access 03/21/2017  . Vasculitis ([place])  08/03/2016  . Acute maculopapular rash 04/21/2016  . Maculopapular rash, generalized 04/20/2016  . Unstable angina ([place]) 09/28/2015    Past Surgical History:  Procedure Laterality Date  . ABDOMINAL HYSTERECTOMY    . APPENDECTOMY    . BLADDER SURGERY    . BOWEL RESECTION    . BREAST BIOPSY Left 1989  . CHOLECYSTECTOMY    . CYST REMOVAL HAND Left 1992   Ganglion cyst removed from Left Wrist  . DILATATION & CURETTAGE/HYSTEROSCOPY WITH MYOSURE    . KNEE ARTHROSCOPY Left 1992  . PANCREAS SURGERY    . PORTA CATH INSERTION N/A 04/10/2017   Procedure: PORTA CATH INSERTION;  Surgeon: Algernon Huxley, MD;  Location: [facility];  Service: Cardiovascular;  Laterality: N/A;  . REPAIR OF ESOPHAGUS  2012   3 clamps placed in esophagus    Prior to Admission medications   Medication Sig Start Date End Date Taking? Authorizing Provider  amLODipine (NORVASC) 5 MG tablet Take 5 mg by mouth daily.    [provider]  aspirin EC 81 MG tablet Take 81 mg by mouth daily.    [provider]  Calcium Carb-Cholecalciferol (CALCIUM 600/VITAMIN D3 PO) Take 1 tablet by mouth daily.    [provider]  cephALEXin (KEFLEX) 500 MG capsule Take 1 capsule (500 mg total) by mouth 3 (three) times daily for 7 days. 08/06/17 08/13/17  Rudene Re, MD  cloNIDine (CATAPRES) 0.1 MG tablet Take 0.1 mg  by mouth daily.    [provider]  colestipol (COLESTID) 1 g tablet Take 2 g by mouth daily.    [provider]  hydrochlorothiazide (HYDRODIURIL) 12.5 MG tablet Take by mouth. 05/22/17   [provider]  lipase/protease/amylase (CREON) 36000 UNITS CPEP capsule Take 108,000 Units by mouth 3 (three) times daily before meals.    [provider]  lisinopril (PRINIVIL,ZESTRIL) 40 MG tablet Take 40 mg by mouth daily.    [provider]  NOVOLOG 100 UNIT/ML injection 15-20 Units by Other route See admin instructions. Use up to 15-20 units daily via  insulin pump. 07/24/16   [provider]  oxyCODONE (ROXICODONE) 5 MG immediate release tablet Take 1 tablet (5 mg total) by mouth every 8 (eight) hours as needed. Patient not taking: Reported on 05/29/2017 01/06/17 01/06/18  Nance Pear, MD  pantoprazole (PROTONIX) 40 MG tablet Take 1 tablet (40 mg total) by mouth 2 (two) times daily. Patient taking differently: Take 40 mg by mouth daily.  09/29/15   Henreitta Leber, MD  vitamin E 400 UNIT capsule Take 400 Units by mouth daily.    [provider]    Allergies Metoclopramide; Morphine and related; Morpholine salicylate; Latex; Demerol [meperidine]; Dilaudid [hydromorphone hcl]; Isosorbide nitrate; Sulfa antibiotics; Darvon [propoxyphene]; Flexeril [cyclobenzaprine]; Levaquin [levofloxacin in d5w]; Nsaids; Soma [carisoprodol]; and Zofran [ondansetron hcl]  Family History  Problem Relation Age of Onset  . Cirrhosis Mother   . Colon cancer Mother 72       TAH/BSO in ~101; deceased at 55  . Hypertension Mother   . Breast cancer Mother 38       unconfirmed  . Hypertension Father   . Prostate cancer Father 34       currently 45  . Other Father        liver disease  . Breast cancer Maternal Aunt        age at dx unk.; currently 48  . Cervical cancer Maternal Aunt   . Cancer Maternal Uncle        unk. type; currently 75s  . Stomach cancer Paternal Aunt 107       deceased at 98  . Breast cancer Maternal Grandmother 59       possibly bilateral in 50s; deceased 35  . Non-Hodgkin's lymphoma Daughter        unconfirmed; currently 93  . Cancer Maternal Aunt        hematologic malignancy; deceased 35    Social History Social History   Tobacco Use  . Smoking status: Never Smoker  . Smokeless tobacco: Never Used  Substance Use Topics  . Alcohol use: No  . Drug use: No    Review of Systems  Constitutional: Negative for fever. Eyes: Negative for visual changes. ENT: Negative for sore throat. Neck: No neck pain    Cardiovascular: Negative for chest pain. Respiratory: Negative for shortness of breath. Gastrointestinal: Negative for abdominal pain or diarrhea. + N/V Genitourinary: Negative for dysuria. Musculoskeletal: Negative for back pain. Skin: Negative for rash. Neurological: Negative for headaches, weakness or numbness. Psych: No SI or HI  ____________________________________________   PHYSICAL EXAM:  VITAL SIGNS: ED Triage Vitals  Enc Vitals Group     BP 08/06/17 1655 (!) 158/80     Pulse Rate 08/06/17 1655 91     Resp 08/06/17 1655 18     Temp 08/06/17 1655 99.5 F (37.5 C)     Temp Source 08/06/17 1655 Oral  SpO2 08/06/17 1655 98 %     Weight 08/06/17 1656 148 lb (67.1 kg)     Height --      Head Circumference --      Peak Flow --      Pain Score 08/06/17 1657 6     Pain Loc --      Pain Edu? --      Excl. in [place]? --     Constitutional: Alert and oriented. Well appearing and in no apparent distress. HEENT:      Head: Normocephalic and atraumatic.         Eyes: Conjunctivae are normal. Sclera is non-icteric.       Mouth/Throat: Mucous membranes are moist.       Neck: Supple with no signs of meningismus. Cardiovascular: Regular rate and rhythm. No murmurs, gallops, or rubs. 2+ symmetrical distal pulses are present in all extremities. No JVD. Respiratory: Normal respiratory effort. Lungs are clear to auscultation bilaterally. No wheezes, crackles, or rhonchi.  Gastrointestinal: Soft, non tender, and non distended with positive bowel sounds. No rebound or guarding. Genitourinary: No CVA tenderness. Musculoskeletal: Nontender with normal range of motion in all extremities. No edema, cyanosis, or erythema of extremities. Neurologic: Normal speech and language. Face is symmetric. Moving all extremities. No gross focal neurologic deficits are appreciated. Skin: Skin is warm, dry and intact. No rash noted. Linear skin abrasion located on L ankle with surrounding erythema, no  warmth, no crepitus. Psychiatric: Mood and affect are normal. Speech and behavior are normal.  ____________________________________________   LABS (all labs ordered are listed, but only abnormal results are displayed)  Labs Reviewed  GLUCOSE, CAPILLARY - Abnormal; Notable for the following components:      Result Value   Glucose-Capillary 579 (*)    All other components within normal limits  BASIC METABOLIC PANEL - Abnormal; Notable for the following components:   Sodium 126 (*)    Chloride 89 (*)    Glucose, Bld 655 (*)    BUN 22 (*)    Calcium 8.6 (*)    All other components within normal limits  CBC - Abnormal; Notable for the following components:   MCHC 36.2 (*)    All other components within normal limits  URINALYSIS, COMPLETE (UACMP) WITH MICROSCOPIC - Abnormal; Notable for the following components:   Color, Urine STRAW (*)    APPearance CLEAR (*)    Glucose, UA >=500 (*)    Ketones, ur 5 (*)    All other components within normal limits  GLUCOSE, CAPILLARY - Abnormal; Notable for the following components:   Glucose-Capillary 540 (*)    All other components within normal limits  GLUCOSE, CAPILLARY - Abnormal; Notable for the following components:   Glucose-Capillary 223 (*)    All other components within normal limits  CBG MONITORING, ED   ____________________________________________  EKG  none  ____________________________________________  RADIOLOGY  none  ____________________________________________   PROCEDURES  Procedure(s) performed: None Procedures Critical Care performed:  None ____________________________________________   INITIAL IMPRESSION / ASSESSMENT AND PLAN / ED COURSE  52 y.o. 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  Patient has had several visits and admissions to the hospital for hyperglycemia.  Recently saw her endocrinologist 3 days ago  and has been using her pump incorrectly.  Patient only gives herself boluses with meals and does not give her any correction based on the blood glucose patient  is well-appearing and in no distress, her vitals are within normal limits.  Her abdomen is soft and nontender.  Patient does have a cut in her left ankle with a small amount of erythema but no warmth and no pain.  The cut was sustained by rubbing of her dog's leash. Will start patient on keflex since patient reports that redness was not present until yesterday. Labs showing hyperglycemia with no evidence of DKA. Will give zofran for nausea, IVF, IV insulin and reassess    _________________________ 7:29 PM on 08/06/2017 ----------------------------------------- After 10 units of IV insulin and a liter of fluid repeat blood glucose is now 223.  Patient is tolerating p.o. with no further episodes of nausea or vomiting.  Again I reiterated the instructions given by her endocrinologist on how to use her pump and her insulin.  At this time patient is stable for discharge home.    As part of my medical decision making, I reviewed the following data within the [place] notes reviewed and incorporated, Labs reviewed , Old chart reviewed, Notes from prior ED visits and  Controlled Substance Database    Pertinent labs & imaging results that were available during my care of the patient were reviewed by me and considered in my medical decision making (see chart for details).    ____________________________________________   FINAL CLINICAL IMPRESSION(S) / ED DIAGNOSES  Final diagnoses:  Hyperglycemia  Cellulitis of left lower extremity      NEW MEDICATIONS STARTED DURING THIS VISIT:  ED Discharge Orders        Ordered    cephALEXin (KEFLEX) 500 MG capsule  3 times daily     08/06/17 1859       Note:  This document was prepared using Dragon voice recognition software and may include unintentional dictation  errors.    Alfred Levins, [place], MD 08/06/17 [phone]

## 2017-08-06 NOTE — ED Triage Notes (Signed)
Pt reports she has been having blood sugars >500 for the past 2 days.  Pt reports that she has an insulin pump.  Pt reports that she has been staying hydrated.  Pt is A&Ox4, in NAD.

## 2017-08-06 NOTE — ED Notes (Signed)
Port accessed at this time, patient tolerated it well.

## 2017-08-06 NOTE — Progress Notes (Signed)
08/06/17 1935 - Amber Christensen with ED RN about IV Consult for PAC deaccess. RN will deaccess PAC per protocol. Randol Kern, RN IV Team

## 2017-08-06 NOTE — ED Notes (Signed)
Pt presents to ED via POV with c/o hyperglycemia. Pt states hx of type 2 DM with hx of pacreatitis. Pt states currently uses insulin pump and has been seen by MD and had insulin pump adjusted several times by PCP without success. Pt presents today with c/o nausea, and hyperglycemia. Pt reports drinking a 12 pack of water/day and doesn't understanding why her blood sugars are "still high". Pt is alert and oriented at this time.

## 2017-08-06 NOTE — Discharge Instructions (Signed)
These are the instructions that your endocrinologist provided to you for management of your blood glucose:  -check sugars at minimum with each meal and at bed time -enter all carb intake into pump before eating -correct high sugars. Check blood glucose every 3-4 hours if not eating and correct sugars consistently at these times.   Take antibiotics for possible early infection of the leash abrasion on your ankle. Return to the ER if the redness continues to expand or if you develop fever.

## 2017-08-15 ENCOUNTER — Ambulatory Visit: Payer: Medicare Other

## 2017-08-29 DIAGNOSIS — R87610 Atypical squamous cells of undetermined significance on cytologic smear of cervix (ASC-US): Secondary | ICD-10-CM | POA: Insufficient documentation

## 2017-09-25 ENCOUNTER — Encounter: Payer: Self-pay | Admitting: Oncology

## 2017-10-02 ENCOUNTER — Encounter: Payer: Self-pay | Admitting: Emergency Medicine

## 2017-10-02 ENCOUNTER — Emergency Department
Admission: EM | Admit: 2017-10-02 | Discharge: 2017-10-02 | Discharge status: Against Medical Advice | Payer: Medicare Other | Attending: Emergency Medicine | Admitting: Emergency Medicine

## 2017-10-02 DIAGNOSIS — Z5321 Procedure and treatment not carried out due to patient leaving prior to being seen by health care provider: Secondary | ICD-10-CM | POA: Diagnosis not present

## 2017-10-02 DIAGNOSIS — R1032 Left lower quadrant pain: Secondary | ICD-10-CM | POA: Insufficient documentation

## 2017-10-02 LAB — URINALYSIS, COMPLETE (UACMP) WITH MICROSCOPIC
Bacteria, UA: NONE SEEN
Bilirubin Urine: NEGATIVE
Glucose, UA: >=500 mg/dL — AB
Hgb urine dipstick: NEGATIVE
Ketones, ur: NEGATIVE mg/dL
Leukocytes, UA: NEGATIVE
Nitrite: NEGATIVE
Protein, ur: NEGATIVE mg/dL
Specific Gravity, Urine: 1.005 (ref 1.005–1.030)
pH: 5 (ref 5.0–8.0)

## 2017-10-02 NOTE — ED Triage Notes (Addendum)
Patient presents to the ED with left sided abdominal pain.  Patient states last time she was in the ED she was constipated and followed up with Dr. Gustavo Lah.  Patient was sent for more x-rays.  Patient states her pain is increasing and she is feeling nauseous.  Patient is requesting to have blood drawn from her port once she is in a room.  Patient is reports abdominal distention.  Patient states pain is over the entire left side.  Patient states she is having 6 to 7 watery bowel movements per day.

## 2017-10-02 NOTE — ED Notes (Signed)
Pt up to registration desk to report that she was going to take her husband home and get him settled in and his prescriptions filled and she would come back later to be seen. Encouraged pt to stay and be seen since she was already here and advised her that she would need to start the check in process over if she left and returned tonight. Pt verbalizes understanding and states that it is fine and she will be back later.

## 2017-11-14 DIAGNOSIS — G459 Transient cerebral ischemic attack, unspecified: Secondary | ICD-10-CM

## 2017-11-14 DIAGNOSIS — I639 Cerebral infarction, unspecified: Secondary | ICD-10-CM

## 2017-11-14 HISTORY — DX: Cerebral infarction, unspecified: I63.9

## 2017-11-14 HISTORY — DX: Transient cerebral ischemic attack, unspecified: G45.9

## 2017-11-22 ENCOUNTER — Observation Stay
Admission: EM | Admit: 2017-11-22 | Discharge: 2017-11-24 | Disposition: A | Discharge status: Home / Self Care | Payer: Medicare Other | Attending: Internal Medicine | Admitting: Internal Medicine

## 2017-11-22 ENCOUNTER — Observation Stay: Payer: Medicare Other

## 2017-11-22 ENCOUNTER — Emergency Department: Payer: Medicare Other

## 2017-11-22 ENCOUNTER — Observation Stay
Admit: 2017-11-22 | Discharge: 2017-11-22 | Disposition: A | Discharge status: Home / Self Care | Payer: Medicare Other | Attending: Internal Medicine | Admitting: Internal Medicine

## 2017-11-22 ENCOUNTER — Other Ambulatory Visit: Payer: Self-pay

## 2017-11-22 DIAGNOSIS — Z8541 Personal history of malignant neoplasm of cervix uteri: Secondary | ICD-10-CM | POA: Diagnosis not present

## 2017-11-22 DIAGNOSIS — K861 Other chronic pancreatitis: Secondary | ICD-10-CM | POA: Diagnosis not present

## 2017-11-22 DIAGNOSIS — R4781 Slurred speech: Secondary | ICD-10-CM | POA: Insufficient documentation

## 2017-11-22 DIAGNOSIS — Z886 Allergy status to analgesic agent status: Secondary | ICD-10-CM | POA: Insufficient documentation

## 2017-11-22 DIAGNOSIS — R109 Unspecified abdominal pain: Secondary | ICD-10-CM

## 2017-11-22 DIAGNOSIS — K219 Gastro-esophageal reflux disease without esophagitis: Secondary | ICD-10-CM | POA: Insufficient documentation

## 2017-11-22 DIAGNOSIS — Z9641 Presence of insulin pump (external) (internal): Secondary | ICD-10-CM | POA: Diagnosis not present

## 2017-11-22 DIAGNOSIS — F329 Major depressive disorder, single episode, unspecified: Secondary | ICD-10-CM | POA: Diagnosis not present

## 2017-11-22 DIAGNOSIS — E271 Primary adrenocortical insufficiency: Secondary | ICD-10-CM | POA: Insufficient documentation

## 2017-11-22 DIAGNOSIS — E876 Hypokalemia: Secondary | ICD-10-CM | POA: Insufficient documentation

## 2017-11-22 DIAGNOSIS — K7460 Unspecified cirrhosis of liver: Secondary | ICD-10-CM | POA: Insufficient documentation

## 2017-11-22 DIAGNOSIS — M48061 Spinal stenosis, lumbar region without neurogenic claudication: Secondary | ICD-10-CM | POA: Insufficient documentation

## 2017-11-22 DIAGNOSIS — Z7982 Long term (current) use of aspirin: Secondary | ICD-10-CM | POA: Insufficient documentation

## 2017-11-22 DIAGNOSIS — Z885 Allergy status to narcotic agent status: Secondary | ICD-10-CM | POA: Insufficient documentation

## 2017-11-22 DIAGNOSIS — Z882 Allergy status to sulfonamides status: Secondary | ICD-10-CM | POA: Diagnosis not present

## 2017-11-22 DIAGNOSIS — F419 Anxiety disorder, unspecified: Secondary | ICD-10-CM | POA: Diagnosis not present

## 2017-11-22 DIAGNOSIS — K5909 Other constipation: Secondary | ICD-10-CM | POA: Diagnosis not present

## 2017-11-22 DIAGNOSIS — E079 Disorder of thyroid, unspecified: Secondary | ICD-10-CM | POA: Insufficient documentation

## 2017-11-22 DIAGNOSIS — Z794 Long term (current) use of insulin: Secondary | ICD-10-CM | POA: Insufficient documentation

## 2017-11-22 DIAGNOSIS — R471 Dysarthria and anarthria: Secondary | ICD-10-CM

## 2017-11-22 DIAGNOSIS — I10 Essential (primary) hypertension: Secondary | ICD-10-CM | POA: Insufficient documentation

## 2017-11-22 DIAGNOSIS — Z9049 Acquired absence of other specified parts of digestive tract: Secondary | ICD-10-CM | POA: Insufficient documentation

## 2017-11-22 DIAGNOSIS — Z79899 Other long term (current) drug therapy: Secondary | ICD-10-CM | POA: Insufficient documentation

## 2017-11-22 DIAGNOSIS — Z7902 Long term (current) use of antithrombotics/antiplatelets: Secondary | ICD-10-CM | POA: Insufficient documentation

## 2017-11-22 DIAGNOSIS — E119 Type 2 diabetes mellitus without complications: Secondary | ICD-10-CM | POA: Diagnosis not present

## 2017-11-22 DIAGNOSIS — Z888 Allergy status to other drugs, medicaments and biological substances status: Secondary | ICD-10-CM | POA: Insufficient documentation

## 2017-11-22 DIAGNOSIS — I200 Unstable angina: Principal | ICD-10-CM | POA: Diagnosis present

## 2017-11-22 DIAGNOSIS — Z881 Allergy status to other antibiotic agents status: Secondary | ICD-10-CM | POA: Insufficient documentation

## 2017-11-22 LAB — COMPREHENSIVE METABOLIC PANEL
ALT: 43 U/L (ref 0–44)
AST: 29 U/L (ref 15–41)
Albumin: 3.8 g/dL (ref 3.5–5.0)
Alkaline Phosphatase: 124 U/L (ref 38–126)
Anion gap: 11 (ref 5–15)
BUN: 12 mg/dL (ref 6–20)
CO2: 26 mmol/L (ref 22–32)
Calcium: 8.2 mg/dL — ABNORMAL LOW (ref 8.9–10.3)
Chloride: 105 mmol/L (ref 98–111)
Creatinine, Ser: 0.65 mg/dL (ref 0.44–1.00)
GFR calc Af Amer: >60 mL/min (ref >60)
GFR calc non Af Amer: >60 mL/min (ref >60)
Glucose, Bld: 125 mg/dL — ABNORMAL HIGH (ref 70–99)
Potassium: 3.2 mmol/L — ABNORMAL LOW (ref 3.5–5.1)
Sodium: 142 mmol/L (ref 135–145)
Total Bilirubin: 3.8 mg/dL — ABNORMAL HIGH (ref 0.3–1.2)
Total Protein: 6.6 g/dL (ref 6.5–8.1)

## 2017-11-22 LAB — CBC
HCT: 33.2 % — ABNORMAL LOW (ref 36.0–46.0)
Hemoglobin: 12 g/dL (ref 12.0–15.0)
MCH: 32.3 pg (ref 26.0–34.0)
MCHC: 36.1 g/dL — ABNORMAL HIGH (ref 30.0–36.0)
MCV: 89.2 fL (ref 80.0–100.0)
Platelets: 153 10*3/uL (ref 150–400)
RBC: 3.72 MIL/uL — ABNORMAL LOW (ref 3.87–5.11)
RDW: 12.5 % (ref 11.5–15.5)
WBC: 5.8 10*3/uL (ref 4.0–10.5)
nRBC: 0 % (ref 0.0–0.2)

## 2017-11-22 LAB — GLUCOSE, CAPILLARY
Glucose-Capillary: 176 mg/dL — ABNORMAL HIGH (ref 70–99)
Glucose-Capillary: 120 mg/dL — ABNORMAL HIGH (ref 70–99)

## 2017-11-22 LAB — HEMOGLOBIN A1C
Hgb A1c MFr Bld: 8 % — ABNORMAL HIGH (ref 4.8–5.6)
Mean Plasma Glucose: 182.9 mg/dL

## 2017-11-22 LAB — TROPONIN I
Troponin I: <0.03 ng/mL (ref <0.03)
Troponin I: <0.03 ng/mL (ref <0.03)
Troponin I: <0.03 ng/mL (ref <0.03)

## 2017-11-22 MED ORDER — INSULIN PUMP
Freq: Three times a day (TID) | SUBCUTANEOUS | Status: DC
  Administered 2017-11-23 (×3): via SUBCUTANEOUS
  Administered 2017-11-24: 4.7 via SUBCUTANEOUS
  Administered 2017-11-24: 3.2 via SUBCUTANEOUS
  Administered 2017-11-24: 02:00:00 via SUBCUTANEOUS
  Filled 2017-11-22: qty 1

## 2017-11-22 MED ORDER — CALCIUM CARBONATE-VITAMIN D 500-200 MG-UNIT PO TABS
1.0000 | ORAL_TABLET | Freq: Every day | ORAL | Status: DC
  Administered 2017-11-23 – 2017-11-24 (×2): 1 via ORAL
  Filled 2017-11-22 (×2): qty 1

## 2017-11-22 MED ORDER — NITROGLYCERIN 2 % TD OINT
1.0000 [in_us] | TOPICAL_OINTMENT | Freq: Four times a day (QID) | TRANSDERMAL | Status: DC
  Administered 2017-11-22 – 2017-11-24 (×6): 1 [in_us] via TOPICAL
  Filled 2017-11-22 (×7): qty 1

## 2017-11-22 MED ORDER — PANCRELIPASE (LIP-PROT-AMYL) 12000-38000 UNITS PO CPEP
108000.0000 [IU] | ORAL_CAPSULE | Freq: Three times a day (TID) | ORAL | Status: DC
  Administered 2017-11-22 – 2017-11-24 (×4): 108000 [IU] via ORAL
  Filled 2017-11-22 (×7): qty 9

## 2017-11-22 MED ORDER — PANTOPRAZOLE SODIUM 40 MG PO TBEC
40.0000 mg | DELAYED_RELEASE_TABLET | Freq: Every day | ORAL | Status: DC
  Administered 2017-11-23 – 2017-11-24 (×2): 40 mg via ORAL
  Filled 2017-11-22 (×2): qty 1

## 2017-11-22 MED ORDER — ENOXAPARIN SODIUM 40 MG/0.4ML ~~LOC~~ SOLN
40.0000 mg | SUBCUTANEOUS | Status: DC
  Administered 2017-11-22 – 2017-11-23 (×2): 40 mg via SUBCUTANEOUS
  Filled 2017-11-22 (×2): qty 0.4

## 2017-11-22 MED ORDER — NAPROXEN 250 MG PO TABS
250.0000 mg | ORAL_TABLET | Freq: Two times a day (BID) | ORAL | Status: DC | PRN
  Administered 2017-11-23: 250 mg via ORAL
  Filled 2017-11-22 (×3): qty 1

## 2017-11-22 MED ORDER — INSULIN ASPART 100 UNIT/ML ~~LOC~~ SOLN
1000.0000 [IU] | Freq: Once | SUBCUTANEOUS | Status: AC
  Administered 2017-11-23: 1000 [IU] via SUBCUTANEOUS
  Filled 2017-11-22: qty 10

## 2017-11-22 MED ORDER — ASPIRIN 81 MG PO CHEW
324.0000 mg | CHEWABLE_TABLET | Freq: Once | ORAL | Status: AC
  Administered 2017-11-22: 324 mg via ORAL
  Filled 2017-11-22: qty 4

## 2017-11-22 MED ORDER — NITROGLYCERIN IN D5W 200-5 MCG/ML-% IV SOLN
0.0000 ug/min | Freq: Once | INTRAVENOUS | Status: AC
  Administered 2017-11-22: 5 ug/min via INTRAVENOUS
  Filled 2017-11-22: qty 250

## 2017-11-22 MED ORDER — INFLUENZA VAC SPLIT QUAD 0.5 ML IM SUSY
0.5000 mL | PREFILLED_SYRINGE | INTRAMUSCULAR | Status: DC
  Filled 2017-11-22: qty 0.5

## 2017-11-22 MED ORDER — INSULIN PUMP
Freq: Three times a day (TID) | SUBCUTANEOUS | Status: DC
  Filled 2017-11-22: qty 1

## 2017-11-22 MED ORDER — LISINOPRIL 10 MG PO TABS
40.0000 mg | ORAL_TABLET | Freq: Once | ORAL | Status: AC
  Administered 2017-11-22: 40 mg via ORAL
  Filled 2017-11-22: qty 4

## 2017-11-22 MED ORDER — ONDANSETRON HCL 4 MG/2ML IJ SOLN: 4.0000 mg | Freq: Four times a day (QID) | INTRAMUSCULAR | Status: DC | PRN

## 2017-11-22 MED ORDER — ASPIRIN EC 81 MG PO TBEC: 81.0000 mg | DELAYED_RELEASE_TABLET | Freq: Every day | ORAL | Status: DC

## 2017-11-22 MED ORDER — ACETAMINOPHEN 325 MG PO TABS
650.0000 mg | ORAL_TABLET | ORAL | Status: DC | PRN
  Administered 2017-11-22 – 2017-11-23 (×2): 650 mg via ORAL
  Filled 2017-11-22 (×2): qty 2

## 2017-11-22 MED ORDER — LISINOPRIL 20 MG PO TABS
40.0000 mg | ORAL_TABLET | Freq: Every day | ORAL | Status: DC
  Administered 2017-11-23 – 2017-11-24 (×2): 40 mg via ORAL
  Filled 2017-11-22 (×3): qty 2

## 2017-11-22 MED ORDER — VITAMIN E 180 MG (400 UNIT) PO CAPS
400.0000 [IU] | ORAL_CAPSULE | Freq: Every day | ORAL | Status: DC
  Administered 2017-11-23 – 2017-11-24 (×2): 400 [IU] via ORAL
  Filled 2017-11-22 (×2): qty 1

## 2017-11-22 MED ORDER — INSULIN ASPART 100 UNIT/ML ~~LOC~~ SOLN: 0.0000 [IU] | Freq: Every day | SUBCUTANEOUS | Status: DC

## 2017-11-22 MED ORDER — INSULIN ASPART 100 UNIT/ML ~~LOC~~ SOLN: 0.0000 [IU] | Freq: Three times a day (TID) | SUBCUTANEOUS | Status: DC

## 2017-11-22 MED ORDER — NITROGLYCERIN 0.4 MG SL SUBL
0.4000 mg | SUBLINGUAL_TABLET | SUBLINGUAL | Status: DC | PRN
  Administered 2017-11-22 (×3): 0.4 mg via SUBLINGUAL
  Filled 2017-11-22: qty 1

## 2017-11-22 MED ORDER — AMLODIPINE BESYLATE 5 MG PO TABS
5.0000 mg | ORAL_TABLET | Freq: Every day | ORAL | Status: DC
  Administered 2017-11-23 – 2017-11-24 (×2): 5 mg via ORAL
  Filled 2017-11-22 (×2): qty 1

## 2017-11-22 MED ORDER — ATORVASTATIN CALCIUM 20 MG PO TABS
40.0000 mg | ORAL_TABLET | Freq: Every day | ORAL | Status: DC
  Administered 2017-11-22: 40 mg via ORAL
  Filled 2017-11-22 (×2): qty 2

## 2017-11-22 MED ORDER — VITAMIN D3 25 MCG (1000 UNIT) PO TABS
5000.0000 [IU] | ORAL_TABLET | Freq: Every day | ORAL | Status: DC
  Administered 2017-11-23 – 2017-11-24 (×2): 5000 [IU] via ORAL
  Filled 2017-11-22 (×2): qty 5

## 2017-11-22 MED ORDER — ASPIRIN EC 81 MG PO TBEC
81.0000 mg | DELAYED_RELEASE_TABLET | Freq: Every day | ORAL | Status: DC
  Administered 2017-11-23 – 2017-11-24 (×2): 81 mg via ORAL
  Filled 2017-11-22 (×2): qty 1

## 2017-11-22 MED ORDER — POTASSIUM CHLORIDE CRYS ER 20 MEQ PO TBCR
40.0000 meq | EXTENDED_RELEASE_TABLET | Freq: Once | ORAL | Status: AC
  Administered 2017-11-22: 40 meq via ORAL
  Filled 2017-11-22: qty 2

## 2017-11-22 NOTE — Consult Note (Signed)
[facility] Cardiology  CARDIOLOGY CONSULT NOTE  Patient ID: Amber Christensen MRN: [id number] DOB/AGE: [birth year] 52 y.o.  Admit date: 11/22/2017 Referring Physician Dr. Nicholes Mango  Primary Physician Dr. Glendon Axe Primary Cardiologist Dr. Lujean Amel Reason for Consultation Unstable angina   HPI: Amber Christensen is a 52 year old female with a past medical history significant for non-alcoholic cirrhosis, chronic pancreatitis s/p partial pancreatectomy, type 2 diabetes, and GERD who presented to the ED on 11/22/17 with intermittent 8/10 left-sided chest pain that she first noticed a few days ago.  Associated it to anxiety but when it didn't resolve, patient decided to present to the ED.  Associated shortness of breath.  Radiates to her jaw, down her arm and reports associated left lower leg weakness.  Also reports slurred speech for 1 week. ECG revealed sinus rhythm with no ischemic changes. Initial troponin was negative. Chest x-ray negative for active cardiopulmonary disease. Head CT negative for any acute abnormality. On nitro drip which has slightly improved symptoms.    Is followed outpatient by Dr. Clayborn Bigness.  Stress test in 2015 revealed normal heart function with no evidence of ischemia.   Review of systems complete and found to be negative unless listed above     Past Medical History:  Diagnosis Date  . Allergy   . Anemia   . Anxiety   . 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)   . Blood transfusion without reported diagnosis   . Cervical cancer ([place])   . Clotting disorder ([place])   . Depression   . Diabetes mellitus without complication ([place])   . GERD (gastroesophageal reflux disease)   . Heart disease   . 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 Hypertension   . Liver disease   . Pancreatitis   . Thyroid disease     Past Surgical History:  Procedure Laterality Date  . ABDOMINAL HYSTERECTOMY    . APPENDECTOMY    . BLADDER SURGERY    . BOWEL RESECTION    . BREAST BIOPSY Left 1989  . CHOLECYSTECTOMY     . CYST REMOVAL HAND Left 1992   Ganglion cyst removed from Left Wrist  . DILATATION & CURETTAGE/HYSTEROSCOPY WITH MYOSURE    . KNEE ARTHROSCOPY Left 1992  . PANCREAS SURGERY    . PORTA CATH INSERTION N/A 04/10/2017   Procedure: PORTA CATH INSERTION;  Surgeon: Algernon Huxley, MD;  Location: [place] [facility];  Service: Cardiovascular;  Laterality: N/A;  . REPAIR OF ESOPHAGUS  2012   3 clamps placed in esophagus    Medications Prior to Admission  Medication Sig Dispense Refill Last Dose  . amLODipine (NORVASC) 5 MG tablet Take 5 mg by mouth daily.   11/22/2017 at 0800  . aspirin EC 81 MG tablet Take 81 mg by mouth daily.   11/22/2017 at 0800  . Calcium Carb-Cholecalciferol (CALCIUM 600/VITAMIN D3 PO) Take 1 tablet by mouth daily.   11/22/2017 at 0800  . Cholecalciferol (VITAMIN D-3) 5000 units TABS Take 5,000 Units by mouth daily.   11/22/2017 at 0800  . Dexlansoprazole 30 MG capsule Take 30 mg by mouth daily.   11/22/2017 at 0800  . hydrochlorothiazide (HYDRODIURIL) 12.5 MG tablet Take 12.5 mg by mouth daily.    11/22/2017 at 0800  . lipase/protease/amylase (CREON) 36000 UNITS CPEP capsule Take 108,000 Units by mouth 3 (three) times daily before meals.   11/22/2017 at 0800  . lisinopril (PRINIVIL,ZESTRIL) 40 MG tablet Take 40 mg by mouth daily.   11/21/2017 at 0800  . naproxen  sodium (ALEVE) 220 MG tablet Take 220-440 mg by mouth 2 (two) times daily as needed (pain).   Unknown at PRN  . NOVOLOG 100 UNIT/ML injection Inject 40 Units into the skin See admin instructions. Up to 40u via insulin pump.   As directed at As directed  . vitamin E 400 UNIT capsule Take 400 Units by mouth daily.   11/22/2017 at 0800  . oxyCODONE (ROXICODONE) 5 MG immediate release tablet Take 1 tablet (5 mg total) by mouth every 8 (eight) hours as needed. (Patient not taking: Reported on 05/29/2017) 20 tablet 0 Not Taking at Unknown time  . pantoprazole (PROTONIX) 40 MG tablet Take 1 tablet (40 mg total) by mouth 2 (two) times  daily. (Patient not taking: Reported on 11/22/2017) 60 tablet 1 Not Taking at Unknown time   Social History   Socioeconomic History  . Marital status: Married    Spouse name: Not on file  . Number of children: Not on file  . Years of education: Not on file  . Highest education level: Not on file  Occupational History  . Not on file  Social Needs  . Financial resource strain: Not on file  . Food insecurity:    Worry: Not on file    Inability: Not on file  . Transportation needs:    Medical: Not on file    Non-medical: Not on file  Tobacco Use  . Smoking status: Never Smoker  . Smokeless tobacco: Never Used  Substance and Sexual Activity  . Alcohol use: No  . Drug use: No  . Sexual activity: Yes    Birth control/protection: Surgical  Lifestyle  . Physical activity:    Days per week: Not on file    Minutes per session: Not on file  . Stress: Not on file  Relationships  . Social connections:    Talks on phone: Not on file    Gets together: Not on file    Attends religious service: Not on file    Active member of club or organization: Not on file    Attends meetings of clubs or organizations: Not on file    Relationship status: Not on file  . Intimate partner violence:    Fear of current or ex partner: Not on file    Emotionally abused: Not on file    Physically abused: Not on file    Forced sexual activity: Not on file  Other Topics Concern  . Not on file  Social History Narrative   Working, independent at baseline    Family History  Problem Relation Age of Onset  . Cirrhosis Mother   . Colon cancer Mother 30       TAH/BSO in ~49; deceased at 75  . Hypertension Mother   . Breast cancer Mother 38       unconfirmed  . Hypertension Father   . Prostate cancer Father 35       currently 47  . Other Father        liver disease  . Breast cancer Maternal Aunt        age at dx unk.; currently 13  . Cervical cancer Maternal Aunt   . Cancer Maternal Uncle         unk. type; currently 64s  . Stomach cancer Paternal Aunt 5       deceased at 61  . Breast cancer Maternal Grandmother 66       possibly bilateral in 33s; deceased 14  . Non-Hodgkin's  lymphoma Daughter        unconfirmed; currently 20  . Cancer Maternal Aunt        hematologic malignancy; deceased 78      Review of systems complete and found to be negative unless listed above      PHYSICAL EXAM  General: Well developed, well nourished. Sitting up in bed, appears in no acute distress HEENT:  Normocephalic and atramatic Neck:  No JVD.  Lungs: Clear bilaterally to auscultation and percussion. Heart: HRRR . Normal S1 and S2 without gallops or murmurs.  Abdomen: Bowel sounds are positive, abdomen soft and non-tender  Msk:  Back normal. Normal strength and tone for age. Gait not assessed Extremities: No clubbing, cyanosis or edema.   Neuro: Alert and oriented X 3. Psych:  Good affect, responds appropriately  Labs:   Lab Results  Component Value Date   WBC 5.8 11/22/2017   HGB 12.0 11/22/2017   HCT 33.2 (L) 11/22/2017   MCV 89.2 11/22/2017   PLT 153 11/22/2017    Recent Labs  Lab 11/22/17 1410  NA 142  K 3.2*  CL 105  CO2 26  BUN 12  CREATININE 0.65  CALCIUM 8.2*  PROT 6.6  BILITOT 3.8*  ALKPHOS 124  ALT 43  AST 29  GLUCOSE 125*   Lab Results  Component Value Date   TROPONINI <0.03 11/22/2017    Lab Results  Component Value Date   CHOL 100 09/28/2015   Lab Results  Component Value Date   HDL 19 (L) 09/28/2015   Lab Results  Component Value Date   LDLCALC 68 09/28/2015   Lab Results  Component Value Date   TRIG 64 09/28/2015   Lab Results  Component Value Date   CHOLHDL 5.3 09/28/2015   No results found for: LDLDIRECT    Radiology: Dg Chest 2 View  Result Date: 11/22/2017 CLINICAL DATA:  Chest pain. EXAM: CHEST - 2 VIEW COMPARISON:  Radiographs of June 02, 2017. FINDINGS: The heart size and mediastinal contours are within normal limits.  Both lungs are clear. No pneumothorax or pleural effusion is noted. Right internal jugular Port-A-Cath is unchanged in position. The visualized skeletal structures are unremarkable. IMPRESSION: No active cardiopulmonary disease. Electronically Signed   By: Marijo Conception, M.D.   On: 11/22/2017 14:19   Ct Head Wo Contrast  Result Date: 11/22/2017 CLINICAL DATA:  Generalized weakness and left-sided arm pain for several hours, no known injury, initial encounter EXAM: CT HEAD WITHOUT CONTRAST TECHNIQUE: Contiguous axial images were obtained from the base of the skull through the vertex without intravenous contrast. COMPARISON:  MRI from 10/28/2015 FINDINGS: Brain: Mild chronic white matter ischemic changes noted. No findings to suggest acute hemorrhage, acute infarction or space-occupying mass lesion are noted. Vascular: No hyperdense vessel or unexpected calcification. Skull: Normal. Negative for fracture or focal lesion. Sinuses/Orbits: No acute finding. Other: None. IMPRESSION: Chronic white matter ischemic changes stable from previous MRI. No acute abnormality noted. Electronically Signed   By: Inez Catalina M.D.   On: 11/22/2017 16:03    EKG: Sinus rhythm with no ischemic changes  ASSESSMENT AND PLAN:  1.  Chest pain   -First troponin negative; will trend   -ECG negative for acute ischemic changes; inpatient stress test to further evaluate   -Nitro paste  -Echocardiogram ordering; further plan pending results  -Follow up outpatient with Dr. Clayborn Bigness  2.  Essential hypertension   -Continue with current home medication regimen  3.  Type 2 diabetes   -  Continue sliding scale insulin  4.  Slurred speech/left extremity weakness  -CT head negative for acute changes; neurology consulted  5.  Hypokalemia (3.2)  -Continue potassium supplementation and close monitoring    The history, physical exam findings, and plan of care were all discussed with Dr. Bartholome Bill, and all decision making was made in  collaboration.    Signed: Avie Arenas PA-C 11/22/2017, 4:31 PM   Pt seen and examined. Agree with plan above. Functional study in am. D/C iv ntg and place on nitropaste.

## 2017-11-22 NOTE — Progress Notes (Signed)
Dr. Ubaldo Glassing at bedside clarify order for Nitro drip per MD to discontinue medication and will place in [facility] patch. RN will continue to monitor.

## 2017-11-22 NOTE — Progress Notes (Signed)
Talked to Dr. Margaretmary Eddy about patient's chest pain 8/10 still even with nitro drip, with no active order. Per MD call cardiologist. PA for Dr. Ubaldo Glassing is here, will clarify order for Nitro drip. Also let Dr. Margaretmary Eddy know about the potassium at 3.2, order for potassium 40 p.o given.   Performed bedside swallow study, which patient passed, no signs of choking or coughing order for diet started.   Benjamine Mola, NP order for MRI of the brain, but patient has implanted port, per Dr. Margaretmary Eddy let the NP know about the port. RN will continue to monitor.

## 2017-11-22 NOTE — Progress Notes (Signed)
Inpatient Diabetes Program Recommendations  AACE/ADA: New Consensus Statement on Inpatient Glycemic Control (2015)  Target Ranges:  Prepandial:   less than 140 mg/dL      Peak postprandial:   less than 180 mg/dL (1-2 hours)      Critically ill patients:  140 - 180 mg/dL   Lab Results  Component Value Date   GLUCAP 176 (H) 11/22/2017   HGBA1C 7.9 (H) 05/29/2017    Review of Glycemic ControlResults for Amber Christensen, Amber Christensen (MRN [id number]) as of 11/22/2017 16:50  Ref. Range 11/22/2017 16:05  Glucose-Capillary Latest Ref Range: 70 - 99 mg/dL 176 (H)    Diabetes history: DM after pancreatectomy Outpatient Diabetes medications:  Insulin pump: Patient states that her total basal is approximately 15 units Per last documented note with Dr. Gabriel Carina 08/05/16 : Her insulin pump is set at: Basal rates 12 am 0.45 units/hr  2 pm 0.5 units/hr                             24-hr basal = 11.3 units  Bolus settings I:C ratio 1:23 Sensitivty 87 Target 100-110 Max bolus 15 units Current orders for Inpatient glycemic control:  Insulin pump off-Novolog sensitive tid with meals and HS Inpatient Diabetes Program Recommendations:   Called and spoke with patient.  She states that when off her pump, her blood sugars tend to be labile. She has insulin pump supplies at bedside and states that she can restart at any time. Sent message to Dr. Margaretmary Eddy who will restart insulin pump and stop SSI.  Will alert RN and patient.   Thanks,  Adah Perl, RN, BC-ADM Inpatient Diabetes Coordinator Pager [phone] (8a-5p)

## 2017-11-22 NOTE — ED Triage Notes (Signed)
Pt comes via [facility] from home with c/o left upper chest pain that radiates to left arm and jaw. Pt was walking her dog when all this started. Pt states 7/10 pain. EMS gave pt 4-81 mg aspirin and 2 nitro spray. EMS also reports pt was hypertensive with BP-200-100. Pt reports head pain and increased when walking. Pt is A&OX4.

## 2017-11-22 NOTE — ED Notes (Signed)
Pt's port upper left chest accessed. Port flushed and blood return present. Bandages in place. Date, time and initials on bandages.

## 2017-11-22 NOTE — H&P (Signed)
[place] [facility] [place] NAME: Bessy Reaney    MR#:  [id number]  DATE OF BIRTH:  1965-12-21  DATE OF ADMISSION:  11/22/2017  PRIMARY CARE PHYSICIAN: Glendon Axe, MD   REQUESTING/REFERRING PHYSICIAN: Rudene Re, MD  CHIEF COMPLAINT:   Chest pain HISTORY OF PRESENT ILLNESS:  Charnette Younkin  is a 52 y.o. female with a known history of nonalcoholic liver cirrhosis, chronic pancreatitis status post partial pancreatectomy, type 2 diabetes mellitus insulin requiring on insulin pump, GERD, chronic anemia and multiple other medical problems is presenting to the ED with a chief complaint of chest pain.  She is reporting left-sided chest pain 8 out of 10 radiating to the left jaw left shoulder and left side of the face.  Eventually she also reported that she has been experiencing slurry speech for 1 week which was more noticeable yesterday by her family members.  Today she is reporting left leg weakness.  Initial EKG is with no acute ST changes.  Troponin is negative.  CT head is ordered which is pending.  Patient already received aspirin and patient was placed in nitroglycerin drip for unstable angina in the emergency department.  Patient reports that the nitroglycerin drip is not improving her pain  PAST MEDICAL HISTORY:   Past Medical History:  Diagnosis Date  . Allergy   . Anemia   . Anxiety   . 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[place])   . Blood transfusion without reported diagnosis   . Cervical cancer ([place])   . Clotting disorder ([place])   . Depression   . Diabetes mellitus without complication ([place])   . GERD (gastroesophageal reflux disease)   . Heart disease   . 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 Hypertension   . Liver disease   . Pancreatitis   . Thyroid disease     PAST SURGICAL HISTOIRY:   Past Surgical History:  Procedure Laterality Date  . ABDOMINAL HYSTERECTOMY    . APPENDECTOMY    . BLADDER SURGERY    . BOWEL RESECTION    . BREAST  BIOPSY Left 1989  . CHOLECYSTECTOMY    . CYST REMOVAL HAND Left 1992   Ganglion cyst removed from Left Wrist  . DILATATION & CURETTAGE/HYSTEROSCOPY WITH MYOSURE    . KNEE ARTHROSCOPY Left 1992  . PANCREAS SURGERY    . PORTA CATH INSERTION N/A 04/10/2017   Procedure: PORTA CATH INSERTION;  Surgeon: Algernon Huxley, MD;  Location: [place] [facility];  Service: Cardiovascular;  Laterality: N/A;  . REPAIR OF ESOPHAGUS  2012   3 clamps placed in esophagus    SOCIAL HISTORY:   Social History   Tobacco Use  . Smoking status: Never Smoker  . Smokeless tobacco: Never Used  Substance Use Topics  . Alcohol use: No    FAMILY HISTORY:   Family History  Problem Relation Age of Onset  . Cirrhosis Mother   . Colon cancer Mother 17       TAH/BSO in ~22; deceased at 67  . Hypertension Mother   . Breast cancer Mother 39       unconfirmed  . Hypertension Father   . Prostate cancer Father 76       currently 46  . Other Father        liver disease  . Breast cancer Maternal Aunt        age at dx unk.; currently 4  . Cervical cancer Maternal Aunt   . Cancer Maternal Uncle  unk. type; currently 42s  . Stomach cancer Paternal Aunt 22       deceased at 87  . Breast cancer Maternal Grandmother 21       possibly bilateral in 26s; deceased 43  . Non-Hodgkin's lymphoma Daughter        unconfirmed; currently 59  . Cancer Maternal Aunt        hematologic malignancy; deceased 40    DRUG ALLERGIES:   Allergies  Allergen Reactions  . Metoclopramide Hives  . Morphine And Related Anaphylaxis and Other (See Comments)    Pt states that this medication causes cardiac arrest.    . Morpholine Salicylate Nausea And Vomiting, Other (See Comments) and Rash    CHF  . Latex Rash  . Demerol [Meperidine] Hives and Other (See Comments)    Pt states that this medication causes cardiac arrest.    . Dilaudid [Hydromorphone Hcl] Nausea And Vomiting and Other (See Comments)    Pt states that this  medication causes cardiac arrest.    . Isosorbide Nitrate Other (See Comments)    Other reaction(s): Headache   . Sulfa Antibiotics Nausea And Vomiting  . Darvon [Propoxyphene] Nausea And Vomiting and Rash  . Flexeril [Cyclobenzaprine] Nausea And Vomiting and Rash  . Levaquin [Levofloxacin In D5w] Nausea And Vomiting and Rash  . Nsaids Rash and Other (See Comments)    Naproxen causes a rash; patient is able to tolerate Advil   . Soma [Carisoprodol] Nausea And Vomiting and Rash  . Zofran [Ondansetron Hcl] Nausea And Vomiting and Rash    REVIEW OF SYSTEMS:  CONSTITUTIONAL: No fever, fatigue or weakness.  EYES: No blurred or double vision.  EARS, NOSE, AND THROAT: No tinnitus or ear pain.  RESPIRATORY: No cough, shortness of breath, wheezing or hemoptysis.  CARDIOVASCULAR: Reporting left-sided chest pain, denies orthopnea, edema.  GASTROINTESTINAL: No nausea, vomiting, diarrhea or abdominal pain.  GENITOURINARY: No dysuria, hematuria.  ENDOCRINE: No polyuria, nocturia,  HEMATOLOGY: No anemia, easy bruising or bleeding SKIN: No rash or lesion. MUSCULOSKELETAL: No joint pain or arthritis.   NEUROLOGIC: No tingling, numbness, but reporting left leg weakness and dysarthria for 1 week PSYCHIATRY: No anxiety or depression.   MEDICATIONS AT HOME:   Prior to Admission medications   Medication Sig Start Date End Date Taking? Authorizing Provider  amLODipine (NORVASC) 5 MG tablet Take 5 mg by mouth daily.   Yes [provider]  aspirin EC 81 MG tablet Take 81 mg by mouth daily.   Yes [provider]  Calcium Carb-Cholecalciferol (CALCIUM 600/VITAMIN D3 PO) Take 1 tablet by mouth daily.   Yes [provider]  Cholecalciferol (VITAMIN D-3) 5000 units TABS Take 5,000 Units by mouth daily.   Yes [provider]  Dexlansoprazole 30 MG capsule Take 30 mg by mouth daily.   Yes [provider]  hydrochlorothiazide (HYDRODIURIL) 12.5 MG tablet Take 12.5  mg by mouth daily.  05/22/17  Yes [provider]  lipase/protease/amylase (CREON) 36000 UNITS CPEP capsule Take 108,000 Units by mouth 3 (three) times daily before meals.   Yes [provider]  lisinopril (PRINIVIL,ZESTRIL) 40 MG tablet Take 40 mg by mouth daily.   Yes [provider]  naproxen sodium (ALEVE) 220 MG tablet Take 220-440 mg by mouth 2 (two) times daily as needed (pain).   Yes [provider]  NOVOLOG 100 UNIT/ML injection Inject 40 Units into the skin See admin instructions. Up to 40u via insulin pump.   Yes [provider]  vitamin E 400 UNIT capsule Take 400 Units by mouth daily.   Yes [provider]  oxyCODONE (ROXICODONE) 5 MG immediate release tablet Take 1 tablet (5 mg total) by mouth every 8 (eight) hours as needed. Patient not taking: Reported on 05/29/2017 01/06/17 01/06/18  Nance Pear, MD  pantoprazole (PROTONIX) 40 MG tablet Take 1 tablet (40 mg total) by mouth 2 (two) times daily. Patient not taking: Reported on 11/22/2017 09/29/15   Henreitta Leber, MD      VITAL SIGNS:  Blood pressure (!) 159/94, pulse 91, temperature 98.8 F (37.1 C), temperature source Oral, resp. rate 19, height 5' 4"  (1.626 m), weight 63.5 kg, SpO2 98 %.  PHYSICAL EXAMINATION:  GENERAL:  52 y.o.-year-old patient lying in the bed with no acute distress.  EYES: Pupils equal, round, reactive to light and accommodation. No scleral icterus. Extraocular muscles intact.  HEENT: Head atraumatic, normocephalic. Oropharynx and nasopharynx clear.  NECK:  Supple, no jugular venous distention. No thyroid enlargement, no tenderness.  LUNGS: Normal breath sounds bilaterally, no wheezing, rales,rhonchi or crepitation. No use of accessory muscles of respiration.  CARDIOVASCULAR: S1, S2 normal. No murmurs, rubs, or gallops.  ABDOMEN: Soft, nontender, nondistended. Bowel sounds present.  EXTREMITIES: No pedal edema, cyanosis, or clubbing.  NEUROLOGIC:  Cranial nerves II through XII are intact. Muscle strength 5/5 in all right extremities.  Left lower extremity motor 3 out of 5 left upper extremity 5 out of 5 sensation intact. Gait not checked.  No facial droop PSYCHIATRIC: The patient is alert and oriented x 3.  SKIN: No obvious rash, lesion, or ulcer.   LABORATORY PANEL:   CBC Recent Labs  Lab 11/22/17 1410  WBC 5.8  HGB 12.0  HCT 33.2*  PLT 153   ------------------------------------------------------------------------------------------------------------------  Chemistries  Recent Labs  Lab 11/22/17 1410  NA 142  K 3.2*  CL 105  CO2 26  GLUCOSE 125*  BUN 12  CREATININE 0.65  CALCIUM 8.2*  AST 29  ALT 43  ALKPHOS 124  BILITOT 3.8*   ------------------------------------------------------------------------------------------------------------------  Cardiac Enzymes Recent Labs  Lab 11/22/17 1410  TROPONINI <0.03   ------------------------------------------------------------------------------------------------------------------  RADIOLOGY:  Dg Chest 2 View  Result Date: 11/22/2017 CLINICAL DATA:  Chest pain. EXAM: CHEST - 2 VIEW COMPARISON:  Radiographs of June 02, 2017. FINDINGS: The heart size and mediastinal contours are within normal limits. Both lungs are clear. No pneumothorax or pleural effusion is noted. Right internal jugular Port-A-Cath is unchanged in position. The visualized skeletal structures are unremarkable. IMPRESSION: No active cardiopulmonary disease. Electronically Signed   By: Marijo Conception, M.D.   On: 11/22/2017 14:19    EKG:   Orders placed or performed during the hospital encounter of 11/22/17  . EKG 12-Lead  . EKG 12-Lead    IMPRESSION AND PLAN:     #Unstable angina strong family history Tele admit Cycle troponins  Echo Nitroglycerine gtt Asa given, will continue the same Cardiology consult placed to Dr. Clayborn Bigness patient's primary cardiologist   #Dysarthria with left  lower extremity weakness-rule out CVA CT head stat N.p.o., bedside swallow evaluation Neurochecks Neurology consult placed Echocardiogram ordered Carotid Dopplers  #Insulin requiring diabetes mellitus Currently patient is n.p.o. CT head is pending Insulin pump discontinued patient will be getting sliding scale insulin Check A1c  #-Outpatient follow-up with gastroenterology  #GERD-ppi   All the records are reviewed and case discussed with ED provider. Management plans discussed with the patient, family and they are in agreement.  CODE  STATUS: fc   TOTAL TIME TAKING CARE OF THIS PATIENT: 3mnutes.   Note: This dictation was prepared with Dragon dictation along with smaller phrase technology. Any transcriptional errors that result from this process are unintentional.  ANicholes MangoM.D on 11/22/2017 at 3:51 PM  Between 7am to 6pm - Pager - [phone] After 6pm go to [email or web address] - password [email or web address] [facility]  Office  [phone] CC: Primary care physician; SGlendon Axe MD

## 2017-11-22 NOTE — ED Notes (Signed)
Report given to Brooke RN

## 2017-11-22 NOTE — Progress Notes (Addendum)
Family Meeting Note  Advance Directive:yes  Today a meeting took place with the Patient.    The following clinical team members were present during this meeting:MD  The following were discussed:Patient's diagnosis: Unstable angina, left-sided weakness, dysarthria, insulin-dependent diabetes metas other comorbidities as documented below, treatment plan of care discussed in detail with the patient and she verbalized understanding of the plan.   Hypertension 03/21/2017   . Diabetes mellitus without complication ([place]) 07/86/7544  . Pancreatitis 03/21/2017  . Poor venous access 03/21/2017  . Vasculitis ([place]) 08/03/2016  . Acute maculopapular rash 04/21/2016  . Maculopapular rash, generalized 04/20/2016  . Unstable angina ([place]) 09/28/2015       Patient's progosis: Unable to determine and Goals for treatment: Full Code, husband Elenore Rota is a healthcare POA  Additional follow-up to be provided: hospitalist, cardiology and neurology  Time spent during discussion:17 min  Nicholes Mango, MD

## 2017-11-22 NOTE — ED Notes (Signed)
Patient transported to X-ray 

## 2017-11-22 NOTE — Progress Notes (Signed)
Patient's blood sugar is 176, will use insulin pump per patient

## 2017-11-22 NOTE — Progress Notes (Signed)
*  PRELIMINARY RESULTS* Echocardiogram 2D Echocardiogram has been performed.  Amber Christensen 11/22/2017, 5:57 PM

## 2017-11-22 NOTE — ED Provider Notes (Signed)
[facility] Emergency Department Provider Note  ____________________________________________  Time seen: Approximately 1:55 PM  I have reviewed the triage vital signs and the nursing notes.   HISTORY  Chief Complaint Chest Pain   HPI Amber Christensen is a 52 y.o. female with a history of nonalcoholic cirrhosis, chronic pancreatitis, type 2 diabetes, GERD, and anemia who presents for evaluation of chest pain.  Patient reports that since she woke up this morning she has not felt well.  She has been feeling weak and tired, just run down.  She reports that this afternoon around 1230 she was at home when she started having chest pain that she describes as pressure located on the left side of her chest radiating to her left arm and jaw.  She checked her blood pressure which was really high with systolics in the 595G at home.  She reports that she took all her medications this morning but forgot to take the lisinopril as she needed to refill that medication.  She reports that over the last 2 weeks she has been having several episodes that are similar to this 1 but they usually last about 20 minutes and resolve without intervention.  This 1 has been constant for longer which made her concerned.  She reports mild shortness of breath and feeling clammy.  No nausea or vomiting.  She reports strong family history of heart attack in both parents and brother.  She is not a smoker but her husband smokes around her every day.  No personal or family history of blood clots, no recent travel immobilization, no leg pain or swelling, no hemoptysis, no exogenous hormones.  The pain is not pleuritic in nature.   Past Medical History:  Diagnosis Date  . Allergy   . Anemia   . Anxiety   . 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 Blood transfusion without reported diagnosis   . Cervical cancer ([place])   . Clotting disorder ([place])   . Depression   . Diabetes mellitus without complication ([place])    . GERD (gastroesophageal reflux disease)   . Heart disease   . 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 Hypertension   . Liver disease   . Pancreatitis   . Thyroid disease     Patient Active Problem List   Diagnosis Date Noted  . Hypertension 03/21/2017  . Diabetes mellitus without complication ([place]) 38/75/6433  . Pancreatitis 03/21/2017  . Poor venous access 03/21/2017  . Vasculitis ([place]) 08/03/2016  . Acute maculopapular rash 04/21/2016  . Maculopapular rash, generalized 04/20/2016  . Unstable angina ([place]) 09/28/2015    Past Surgical History:  Procedure Laterality Date  . ABDOMINAL HYSTERECTOMY    . APPENDECTOMY    . BLADDER SURGERY    . BOWEL RESECTION    . BREAST BIOPSY Left 1989  . CHOLECYSTECTOMY    . CYST REMOVAL HAND Left 1992   Ganglion cyst removed from Left Wrist  . DILATATION & CURETTAGE/HYSTEROSCOPY WITH MYOSURE    . KNEE ARTHROSCOPY Left 1992  . PANCREAS SURGERY    . PORTA CATH INSERTION N/A 04/10/2017   Procedure: PORTA CATH INSERTION;  Surgeon: Algernon Huxley, MD;  Location: [facility];  Service: Cardiovascular;  Laterality: N/A;  . REPAIR OF ESOPHAGUS  2012   3 clamps placed in esophagus    Prior to Admission medications   Medication Sig Start Date End Date Taking? Authorizing Provider  amLODipine (NORVASC) 5 MG tablet Take 5 mg by mouth daily.  Yes [provider]  aspirin EC 81 MG tablet Take 81 mg by mouth daily.   Yes [provider]  Calcium Carb-Cholecalciferol (CALCIUM 600/VITAMIN D3 PO) Take 1 tablet by mouth daily.   Yes [provider]  Cholecalciferol (VITAMIN D-3) 5000 units TABS Take 5,000 Units by mouth daily.   Yes [provider]  Dexlansoprazole 30 MG capsule Take 30 mg by mouth daily.   Yes [provider]  hydrochlorothiazide (HYDRODIURIL) 12.5 MG tablet Take 12.5 mg by mouth daily.  05/22/17  Yes [provider]  lipase/protease/amylase (CREON) 36000 UNITS CPEP capsule Take 108,000 Units by  mouth 3 (three) times daily before meals.   Yes [provider]  lisinopril (PRINIVIL,ZESTRIL) 40 MG tablet Take 40 mg by mouth daily.   Yes [provider]  naproxen sodium (ALEVE) 220 MG tablet Take 220-440 mg by mouth 2 (two) times daily as needed (pain).   Yes [provider]  NOVOLOG 100 UNIT/ML injection Inject 40 Units into the skin See admin instructions. Up to 40u via insulin pump.   Yes [provider]  vitamin E 400 UNIT capsule Take 400 Units by mouth daily.   Yes [provider]  oxyCODONE (ROXICODONE) 5 MG immediate release tablet Take 1 tablet (5 mg total) by mouth every 8 (eight) hours as needed. Patient not taking: Reported on 05/29/2017 01/06/17 01/06/18  Nance Pear, MD  pantoprazole (PROTONIX) 40 MG tablet Take 1 tablet (40 mg total) by mouth 2 (two) times daily. Patient not taking: Reported on 11/22/2017 09/29/15   Henreitta Leber, MD    Allergies Metoclopramide; Morphine and related; Morpholine salicylate; Latex; Demerol [meperidine]; Dilaudid [hydromorphone hcl]; Isosorbide nitrate; Sulfa antibiotics; Darvon [propoxyphene]; Flexeril [cyclobenzaprine]; Levaquin [levofloxacin in d5w]; Nsaids; Soma [carisoprodol]; and Zofran [ondansetron hcl]  Family History  Problem Relation Age of Onset  . Cirrhosis Mother   . Colon cancer Mother 45       TAH/BSO in ~26; deceased at 18  . Hypertension Mother   . Breast cancer Mother 80       unconfirmed  . Hypertension Father   . Prostate cancer Father 48       currently 21  . Other Father        liver disease  . Breast cancer Maternal Aunt        age at dx unk.; currently 50  . Cervical cancer Maternal Aunt   . Cancer Maternal Uncle        unk. type; currently 46s  . Stomach cancer Paternal Aunt 67       deceased at 69  . Breast cancer Maternal Grandmother 12       possibly bilateral in 42s; deceased 93  . Non-Hodgkin's lymphoma Daughter        unconfirmed; currently 44  .  Cancer Maternal Aunt        hematologic malignancy; deceased 45    Social History Social History   Tobacco Use  . Smoking status: Never Smoker  . Smokeless tobacco: Never Used  Substance Use Topics  . Alcohol use: No  . Drug use: No    Review of Systems  Constitutional: Negative for fever. Eyes: Negative for visual changes. ENT: Negative for sore throat. Neck: No neck pain  Cardiovascular: + chest pain. Respiratory: + shortness of breath. Gastrointestinal: Negative for abdominal pain, vomiting or diarrhea. Genitourinary: Negative for dysuria. Musculoskeletal: Negative for back pain. Skin: Negative for rash. Neurological: Negative for headaches, weakness or numbness. Psych:  No SI or HI  ____________________________________________   PHYSICAL EXAM:  VITAL SIGNS: ED Triage Vitals  Enc Vitals Group     BP 11/22/17 1314 (!) 184/110     Pulse Rate 11/22/17 1314 94     Resp 11/22/17 1314 16     Temp 11/22/17 1314 98.8 F (37.1 C)     Temp Source 11/22/17 1314 Oral     SpO2 11/22/17 1314 99 %     Weight 11/22/17 1314 140 lb (63.5 kg)     Height 11/22/17 1314 5' 4"  (1.626 m)     Head Circumference --      Peak Flow --      Pain Score 11/22/17 1312 7     Pain Loc --      Pain Edu? --      Excl. in [place]? --     Constitutional: Alert and oriented. Well appearing and in no apparent distress. HEENT:      Head: Normocephalic and atraumatic.         Eyes: Conjunctivae are normal. Sclera is non-icteric.       Mouth/Throat: Mucous membranes are moist.       Neck: Supple with no signs of meningismus. Cardiovascular: Regular rate and rhythm. No murmurs, gallops, or rubs. 2+ symmetrical distal pulses are present in all extremities. No JVD. Respiratory: Normal respiratory effort. Lungs are clear to auscultation bilaterally. No wheezes, crackles, or rhonchi.  Gastrointestinal: Soft, non tender, and non distended with positive bowel sounds. No rebound or  guarding. Musculoskeletal: Nontender with normal range of motion in all extremities. No edema, cyanosis, or erythema of extremities. Neurologic: Normal speech and language. Face is symmetric. Moving all extremities. No gross focal neurologic deficits are appreciated. Skin: Skin is warm, dry and intact. No rash noted. Psychiatric: Mood and affect are normal. Speech and behavior are normal.  ____________________________________________   LABS (all labs ordered are listed, but only abnormal results are displayed)  Labs Reviewed  CBC - Abnormal; Notable for the following components:      Result Value   RBC 3.72 (*)    HCT 33.2 (*)    MCHC 36.1 (*)    All other components within normal limits  COMPREHENSIVE METABOLIC PANEL - Abnormal; Notable for the following components:   Potassium 3.2 (*)    Glucose, Bld 125 (*)    Calcium 8.2 (*)    Total Bilirubin 3.8 (*)    All other components within normal limits  TROPONIN I   ____________________________________________  EKG  ED ECG REPORT I, Rudene Re, the attending physician, personally viewed and interpreted this ECG.  Normal sinus rhythm, rate of 88, normal intervals, normal axis, no ST elevations or depressions.  Normal EKG. ____________________________________________  RADIOLOGY  I have personally reviewed the images performed during this visit and I agree with the Radiologist's read.   Interpretation by Radiologist:  Dg Chest 2 View  Result Date: 11/22/2017 CLINICAL DATA:  Chest pain. EXAM: CHEST - 2 VIEW COMPARISON:  Radiographs of June 02, 2017. FINDINGS: The heart size and mediastinal contours are within normal limits. Both lungs are clear. No pneumothorax or pleural effusion is noted. Right internal jugular Port-A-Cath is unchanged in position. The visualized skeletal structures are unremarkable. IMPRESSION: No active cardiopulmonary disease. Electronically Signed   By: Marijo Conception, M.D.   On: 11/22/2017 14:19       ____________________________________________   PROCEDURES  Procedure(s) performed: None Procedures Critical Care performed:  None ____________________________________________   INITIAL  IMPRESSION / ASSESSMENT AND PLAN / ED COURSE  52 y.o. female with a history of nonalcoholic cirrhosis, chronic pancreatitis, type 2 diabetes, GERD, and anemia who presents for evaluation of chest pain. ASA given per EMS. Pain improved after nitro given per EMS, still 7/10 pain. Will give nitro, labs and CXR pending. Ddx unstable angina, NSTEMI.   History of chest pain concerning for possible cardiac etiology. Exam is benign. EKG and initial cardiac labs are without evidence of ischemia. Clinical picture and evaluation not suggestive of other serious cause including pneumonia, pulmonary embolism, pneumothorax, esophageal rupture, or aortic dissection. Patient will be admitted for chest pain evaluation.  I discussed the diagnosis and plan of care with the patient and answered all her questions. She states understanding and is in agreement with admission for further work up.       As part of my medical decision making, I reviewed the following data within the [place] notes reviewed and incorporated, Labs reviewed , EKG interpreted , Old EKG reviewed, Old chart reviewed, Radiograph reviewed , Discussed with admitting physician , Notes from prior ED visits and [place] Controlled Substance Database    Pertinent labs & imaging results that were available during my care of the patient were reviewed by me and considered in my medical decision making (see chart for details).    ____________________________________________   FINAL CLINICAL IMPRESSION(S) / ED DIAGNOSES  Final diagnoses:  Unstable angina ([place])      NEW MEDICATIONS STARTED DURING THIS VISIT:  ED Discharge Orders    None       Note:  This document was prepared using Dragon voice recognition software  and may include unintentional dictation errors.    Alfred Levins, [place], MD 11/22/17 [phone]

## 2017-11-23 ENCOUNTER — Observation Stay: Payer: Medicare Other

## 2017-11-23 ENCOUNTER — Encounter: Payer: Self-pay | Admitting: Radiology

## 2017-11-23 DIAGNOSIS — I200 Unstable angina: Secondary | ICD-10-CM | POA: Diagnosis not present

## 2017-11-23 DIAGNOSIS — G459 Transient cerebral ischemic attack, unspecified: Secondary | ICD-10-CM

## 2017-11-23 LAB — TROPONIN I: Troponin I: <0.03 ng/mL (ref <0.03)

## 2017-11-23 LAB — HIV ANTIBODY (ROUTINE TESTING W REFLEX): HIV Screen 4th Generation wRfx: NONREACTIVE

## 2017-11-23 LAB — GLUCOSE, CAPILLARY
Glucose-Capillary: 94 mg/dL (ref 70–99)
Glucose-Capillary: 92 mg/dL (ref 70–99)
Glucose-Capillary: 197 mg/dL — ABNORMAL HIGH (ref 70–99)
Glucose-Capillary: 147 mg/dL — ABNORMAL HIGH (ref 70–99)
Glucose-Capillary: 153 mg/dL — ABNORMAL HIGH (ref 70–99)
Glucose-Capillary: 237 mg/dL — ABNORMAL HIGH (ref 70–99)

## 2017-11-23 LAB — CBC
HCT: 32.1 % — ABNORMAL LOW (ref 36.0–46.0)
Hemoglobin: 11.6 g/dL — ABNORMAL LOW (ref 12.0–15.0)
MCH: 32.5 pg (ref 26.0–34.0)
MCHC: 36.1 g/dL — ABNORMAL HIGH (ref 30.0–36.0)
MCV: 89.9 fL (ref 80.0–100.0)
Platelets: 150 10*3/uL (ref 150–400)
RBC: 3.57 MIL/uL — ABNORMAL LOW (ref 3.87–5.11)
RDW: 12.4 % (ref 11.5–15.5)
WBC: 5 10*3/uL (ref 4.0–10.5)
nRBC: 0 % (ref 0.0–0.2)

## 2017-11-23 LAB — BASIC METABOLIC PANEL
Anion gap: 5 (ref 5–15)
BUN: 15 mg/dL (ref 6–20)
CO2: 28 mmol/L (ref 22–32)
Calcium: 8.1 mg/dL — ABNORMAL LOW (ref 8.9–10.3)
Chloride: 110 mmol/L (ref 98–111)
Creatinine, Ser: 0.59 mg/dL (ref 0.44–1.00)
GFR calc Af Amer: >60 mL/min (ref >60)
GFR calc non Af Amer: >60 mL/min (ref >60)
Glucose, Bld: 108 mg/dL — ABNORMAL HIGH (ref 70–99)
Potassium: 3.3 mmol/L — ABNORMAL LOW (ref 3.5–5.1)
Sodium: 143 mmol/L (ref 135–145)

## 2017-11-23 LAB — LIPID PANEL
Cholesterol: 113 mg/dL (ref 0–200)
HDL: 43 mg/dL (ref >40)
LDL Cholesterol: 62 mg/dL (ref 0–99)
Total CHOL/HDL Ratio: 2.6 RATIO
Triglycerides: 38 mg/dL (ref <150)
VLDL: 8 mg/dL (ref 0–40)

## 2017-11-23 LAB — NM MYOCAR MULTI W/SPECT W/WALL MOTION / EF
Estimated workload: 1 METS
Exercise duration (min): 1 min
Exercise duration (sec): 0 s
LV dias vol: 29 mL (ref 46–106)
LV sys vol: 10 mL
MPHR: 168 {beats}/min
Peak HR: 139 {beats}/min
Percent HR: 82 %
Rest HR: 86 {beats}/min
SDS: 0
SRS: 3
SSS: 0
TID: 0.57

## 2017-11-23 LAB — ECHOCARDIOGRAM COMPLETE
Height: 64 in
Weight: 2240 oz

## 2017-11-23 LAB — LIPASE, BLOOD: Lipase: 21 U/L (ref 11–51)

## 2017-11-23 LAB — TSH: TSH: 2.045 u[IU]/mL (ref 0.350–4.500)

## 2017-11-23 MED ORDER — SODIUM CHLORIDE 0.9% FLUSH
3.0000 mL | Freq: Two times a day (BID) | INTRAVENOUS | Status: DC
  Administered 2017-11-23 – 2017-11-24 (×2): 3 mL via INTRAVENOUS

## 2017-11-23 MED ORDER — TECHNETIUM TC 99M TETROFOSMIN IV KIT
32.6200 | PACK | Freq: Once | INTRAVENOUS | Status: AC | PRN
  Administered 2017-11-23: 32.62 via INTRAVENOUS

## 2017-11-23 MED ORDER — TECHNETIUM TC 99M TETROFOSMIN IV KIT
10.0000 | PACK | Freq: Once | INTRAVENOUS | Status: AC | PRN
  Administered 2017-11-23: 10.77 via INTRAVENOUS

## 2017-11-23 MED ORDER — PROMETHAZINE HCL 25 MG/ML IJ SOLN
25.0000 mg | Freq: Four times a day (QID) | INTRAMUSCULAR | Status: DC | PRN
  Administered 2017-11-23: 25 mg via INTRAVENOUS
  Filled 2017-11-23: qty 1

## 2017-11-23 MED ORDER — FLEET ENEMA 7-19 GM/118ML RE ENEM
1.0000 | ENEMA | Freq: Every day | RECTAL | Status: DC | PRN
  Administered 2017-11-24: 1 via RECTAL
  Filled 2017-11-23: qty 1

## 2017-11-23 MED ORDER — POTASSIUM CHLORIDE CRYS ER 20 MEQ PO TBCR
40.0000 meq | EXTENDED_RELEASE_TABLET | Freq: Once | ORAL | Status: AC
  Administered 2017-11-23: 40 meq via ORAL
  Filled 2017-11-23: qty 2

## 2017-11-23 MED ORDER — DOCUSATE SODIUM 100 MG PO CAPS
100.0000 mg | ORAL_CAPSULE | Freq: Two times a day (BID) | ORAL | Status: DC
  Administered 2017-11-23 – 2017-11-24 (×2): 100 mg via ORAL
  Filled 2017-11-23 (×2): qty 1

## 2017-11-23 MED ORDER — GLYCERIN (LAXATIVE) 2 G RE SUPP
1.0000 | Freq: Every day | RECTAL | Status: DC | PRN
  Administered 2017-11-23: 1 via RECTAL
  Filled 2017-11-23 (×2): qty 1

## 2017-11-23 MED ORDER — CLOPIDOGREL BISULFATE 75 MG PO TABS
75.0000 mg | ORAL_TABLET | Freq: Every day | ORAL | Status: DC
  Administered 2017-11-24: 75 mg via ORAL
  Filled 2017-11-23: qty 1

## 2017-11-23 MED ORDER — REGADENOSON 0.4 MG/5ML IV SOLN
0.4000 mg | Freq: Once | INTRAVENOUS | Status: AC
  Administered 2017-11-23: 0.4 mg via INTRAVENOUS

## 2017-11-23 NOTE — Care Management (Signed)
Discharge canceled as MRI ordered cannot be performed until around 9 pm tonight.  Entered an avoidable day for delay of service.

## 2017-11-23 NOTE — Progress Notes (Signed)
Per Dr. Benjie Karvonen, stress test was normal. Instructed to order STAT MR Lumbar Spine without contrast and call MD with results to determine discharge status.

## 2017-11-23 NOTE — Progress Notes (Signed)
Inpatient Diabetes Program Recommendations  AACE/ADA: New Consensus Statement on Inpatient Glycemic Control (2019)  Target Ranges:  Prepandial:   less than 140 mg/dL      Peak postprandial:   less than 180 mg/dL (1-2 hours)      Critically ill patients:  140 - 180 mg/dL  Results for Amber Christensen, Amber Christensen (MRN [id number]) as of 11/23/2017 12:00  Ref. Range 11/22/2017 16:05 11/22/2017 21:25 11/23/2017 03:57 11/23/2017 07:39  Glucose-Capillary Latest Ref Range: 70 - 99 mg/dL 176 (H) 120 (H) 94 92   Results for Amber Christensen, Amber Christensen (MRN [id number]) as of 11/23/2017 12:00  Ref. 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  Patient states that she was diagnosed with diabetes pancreatectomy and is followed by Dr. Gabriel Carina for diabetes management. Patient uses a Medtronic insulin pump with Novolog insulin as an outpatient and is currently using it inpatient.  Patient states that she is not sure if her insulin pump is functioning properly because the home screen shows Active Insulin is 0 units. Reviewed pump history and noted patient suspended pump around 4:15 am today and resumed her basal rate at 4:23 am. Patient states that she changed out her infusion set, reservoir, and tubing this morning at that time because she was not sure if her pump was working correctly after the MRI since it had active insulin was 0. According to pump history, pump appears to be working correctly and is delivering basal insulin at correct rate. Discussed that since glucose has been 94 and 92 mg/dl today she has not have to bolus any insulin so that may be why active insulin is showing as 0. Patient  reports that she is getting ready to eat lunch and will bolus for carbs she eats. Explained that after she does bolus she should see active insulin units. Asked that if she still sees active insulin as 0 then she may need to call [phone] number on back of her insulin pump so they can troubleshoot the pump. At this time the insulin pump appears to be working correctly and glucose is well controlled.   Current insulin pump settings are as follows:  Basal insulin  12A 0.525 units/hour 2P 0.0.75 units/hour Total daily basal insulin: 14.85 units/24 hours  Carb Coverage 1:15 1 unit for every 15 grams of carbohydrates  Insulin Sensitivity 1:80 1 unit drops blood glucose 80 mg/dl  Target Glucose Goals 12A 100-110 mg/dl  Patient verbalized understanding of information discussed and states that she does not have any further questions related to diabetes at this time.  NURSING: If not already done, please print off the Patient insulin pump contract and flow sheet. The insulin pump contract should be signed by the patient (once during admission) and then placed in the chart. The patient insulin pump flow sheet will be completed by the patient at the bedside and the RN caring for the patient will use the patient's flow sheet to document in the [facility]. RN will need to complete the Nursing Insulin Pump Flowsheet at least once a shift. Patient will need to keep extra insulin pump supplies at the bedside at all times.    Thanks, Barnie Alderman, RN, MSN, CDE Diabetes Coordinator Inpatient Diabetes Program  [phone] (Team Pager from [place] to [place])

## 2017-11-23 NOTE — Plan of Care (Signed)
  Problem: Pain Managment: Goal: General experience of comfort will improve Outcome: Progressing   Problem: Cardiac: Goal: Ability to achieve and maintain adequate cardiovascular perfusion will improve Outcome: Progressing

## 2017-11-23 NOTE — Consult Note (Addendum)
Referring Physician: Mody Sital    Chief Complaint: Chest pain, left lower leg weakness and slurred speech  HPI: Amber Christensen is an 52 y.o. 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  Patient described sharp pain in her left chest radiating to left arm and jaw.  Patient states that she has had a similar episode occurring intermittently for a few days now.  Initially associated with anxiety this time when it persisted he decided to call the EMS.  When EMS arrived she was given 4 baby aspirin and 2 nitro sprays was also noted to be hypertensive with blood pressure 200s.  In the ED she rated her pain at a 7 out of 10 on a pain intensity rating scale.  She states that she had not been feeling well since she woke up that morning.  Symptoms started at around 1230 when she was walking her dog.  She checked her blood pressure and noted that it was elevated in the 180s she admits that she to call her medication that morning but forgot to take her lisinopril she needed refills.  She denies other associated symptoms nausea or vomiting, diaphoresis, hypertension, shortness of breath.  On further straining patient states that he has had left lower leg weakness and slurred speech for the past 1 week.  Initial CT head did not show any acute intracranial abnormality, follow-up with MRI brain was negative as well.  Patient has been evaluated by cardiologist and started on nitro drip which has improved symptoms slightly.  Date last known well: Unable to determine Time last known well: Unable to determine tPA Given: No: Unable to determine last known well.  Past Medical History:  Diagnosis Date  . Allergy   . Anemia   . Anxiety   . 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 Blood transfusion without  reported diagnosis   . Cervical cancer ([place])   . Clotting disorder ([place])   . Depression   . Diabetes mellitus without complication ([place])   . GERD (gastroesophageal reflux disease)   . Heart disease   . 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 Hypertension   . Liver disease   . Pancreatitis   . Thyroid disease     Past Surgical History:  Procedure Laterality Date  . ABDOMINAL HYSTERECTOMY    . APPENDECTOMY    . BLADDER SURGERY    . BOWEL RESECTION    . BREAST BIOPSY Left 1989  . CHOLECYSTECTOMY    . CYST REMOVAL HAND Left 1992   Ganglion cyst removed from Left Wrist  . DILATATION & CURETTAGE/HYSTEROSCOPY WITH MYOSURE    . KNEE ARTHROSCOPY Left 1992  . PANCREAS SURGERY    . PORTA CATH INSERTION N/A 04/10/2017   Procedure: PORTA CATH INSERTION;  Surgeon: Algernon Huxley, MD;  Location: [facility];  Service: Cardiovascular;  Laterality: N/A;  . REPAIR OF ESOPHAGUS  2012   3 clamps placed in esophagus    Family History  Problem Relation Age of Onset  . Cirrhosis Mother   . Colon cancer Mother 66       TAH/BSO in ~26; deceased at 19  . Hypertension Mother   . Breast cancer Mother 71       unconfirmed  . Hypertension Father   . Prostate cancer Father 41       currently 89  .  Other Father        liver disease  . Breast cancer Maternal Aunt        age at dx unk.; currently 50  . Cervical cancer Maternal Aunt   . Cancer Maternal Uncle        unk. type; currently 57s  . Stomach cancer Paternal Aunt 90       deceased at 65  . Breast cancer Maternal Grandmother 10       possibly bilateral in 102s; deceased 34  . Non-Hodgkin's lymphoma Daughter        unconfirmed; currently 62  . Cancer Maternal Aunt        hematologic malignancy; deceased 24   Social History:  reports that she has never smoked. She has never used smokeless tobacco. She reports that she does not drink alcohol or use drugs.  Allergies:  Allergies  Allergen Reactions  . Metoclopramide Hives  . Morphine And Related  Anaphylaxis and Other (See Comments)    Pt states that this medication causes cardiac arrest.    . Morpholine Salicylate Nausea And Vomiting, Other (See Comments) and Rash    CHF  . Latex Rash  . Demerol [Meperidine] Hives and Other (See Comments)    Pt states that this medication causes cardiac arrest.    . Dilaudid [Hydromorphone Hcl] Nausea And Vomiting and Other (See Comments)    Pt states that this medication causes cardiac arrest.    . Isosorbide Nitrate Other (See Comments)    Other reaction(s): Headache   . Sulfa Antibiotics Nausea And Vomiting  . Darvon [Propoxyphene] Nausea And Vomiting and Rash  . Flexeril [Cyclobenzaprine] Nausea And Vomiting and Rash  . Levaquin [Levofloxacin In D5w] Nausea And Vomiting and Rash  . Nsaids Rash and Other (See Comments)    Naproxen causes a rash; patient is able to tolerate Advil   . Soma [Carisoprodol] Nausea And Vomiting and Rash  . Zofran [Ondansetron Hcl] Nausea And Vomiting and Rash    Medications:  I have reviewed the patient's current medications. Prior to Admission:  Medications Prior to Admission  Medication Sig Dispense Refill Last Dose  . amLODipine (NORVASC) 5 MG tablet Take 5 mg by mouth daily.   11/22/2017 at 0800  . aspirin EC 81 MG tablet Take 81 mg by mouth daily.   11/22/2017 at 0800  . Calcium Carb-Cholecalciferol (CALCIUM 600/VITAMIN D3 PO) Take 1 tablet by mouth daily.   11/22/2017 at 0800  . Cholecalciferol (VITAMIN D-3) 5000 units TABS Take 5,000 Units by mouth daily.   11/22/2017 at 0800  . Dexlansoprazole 30 MG capsule Take 30 mg by mouth daily.   11/22/2017 at 0800  . hydrochlorothiazide (HYDRODIURIL) 12.5 MG tablet Take 12.5 mg by mouth daily.    11/22/2017 at 0800  . lipase/protease/amylase (CREON) 36000 UNITS CPEP capsule Take 108,000 Units by mouth 3 (three) times daily before meals.   11/22/2017 at 0800  . lisinopril (PRINIVIL,ZESTRIL) 40 MG tablet Take 40 mg by mouth daily.   11/21/2017 at 0800  . naproxen sodium  (ALEVE) 220 MG tablet Take 220-440 mg by mouth 2 (two) times daily as needed (pain).   Unknown at PRN  . NOVOLOG 100 UNIT/ML injection Inject 40 Units into the skin See admin instructions. Up to 40u via insulin pump.   As directed at As directed  . vitamin E 400 UNIT capsule Take 400 Units by mouth daily.   11/22/2017 at 0800  . oxyCODONE (ROXICODONE) 5 MG immediate release tablet  Take 1 tablet (5 mg total) by mouth every 8 (eight) hours as needed. (Patient not taking: Reported on 05/29/2017) 20 tablet 0 Not Taking at Unknown time  . pantoprazole (PROTONIX) 40 MG tablet Take 1 tablet (40 mg total) by mouth 2 (two) times daily. (Patient not taking: Reported on 11/22/2017) 60 tablet 1 Not Taking at Unknown time   Scheduled: . amLODipine  5 mg Oral Daily  . aspirin EC  81 mg Oral Daily  . atorvastatin  40 mg Oral q1800  . calcium-vitamin D  1 tablet Oral Daily  . cholecalciferol  5,000 Units Oral Daily  . enoxaparin (LOVENOX) injection  40 mg Subcutaneous Q24H  . Influenza vac split quadrivalent PF  0.5 mL Intramuscular Tomorrow-1000  . insulin pump   Subcutaneous TID AC, HS, 0200  . lipase/protease/amylase  108,000 Units Oral TID AC  . lisinopril  40 mg Oral Daily  . nitroGLYCERIN  1 inch Topical Q6H  . pantoprazole  40 mg Oral Daily  . vitamin E  400 Units Oral Daily    ROS: History obtained from the patient   General ROS: negative for - chills, fatigue, fever, night sweats, weight gain or weight loss Psychological ROS: negative for - behavioral disorder, hallucinations, memory difficulties, mood swings or suicidal ideation Ophthalmic ROS: negative for - blurry vision, double vision, eye pain or loss of vision ENT ROS: negative for - epistaxis, nasal discharge, oral lesions, sore throat, tinnitus or vertigo Allergy and Immunology ROS: negative for - hives or itchy/watery eyes Hematological and Lymphatic ROS: negative for - bleeding problems, bruising or swollen lymph nodes Endocrine ROS:  negative for - galactorrhea, hair pattern changes, polydipsia/polyuria or temperature intolerance Respiratory ROS: negative for - cough, hemoptysis, shortness of breath or wheezing Cardiovascular ROS: negative for - chest pain, dyspnea on exertion, edema or irregular heartbeat Gastrointestinal ROS: negative for - abdominal pain, diarrhea, hematemesis, nausea/vomiting or stool incontinence Genito-Urinary ROS: negative for - dysuria, hematuria, incontinence or urinary frequency/urgency Musculoskeletal ROS: negative for - joint swelling or muscular weakness Neurological ROS: as noted in HPI Dermatological ROS: negative for rash and skin lesion changes  Physical Examination: Blood pressure 121/87, pulse 78, temperature 98.6 F (37 C), temperature source Oral, resp. rate 18, height 5' 4"  (1.626 m), weight 63.5 kg, SpO2 98 %.   HEENT-  Normocephalic, no lesions, without obvious abnormality.  Normal external eye and conjunctiva.  Normal TM's bilaterally.  Normal auditory canals and external ears. Normal external nose, mucus membranes and septum.  Normal pharynx. Cardiovascular- S1, S2 normal, pulses palpable throughout   Lungs- chest clear, no wheezing, rales, normal symmetric air entry Abdomen- soft, non-tender; bowel sounds normal; no masses,  no organomegaly Extremities- no edema Lymph-no adenopathy palpable Musculoskeletal-no joint tenderness, deformity or swelling Skin-warm and dry, no hyperpigmentation, vitiligo, or suspicious lesions  Neurological Exam   Mental Status: Alert, oriented, thought content appropriate.  Speech fluent without evidence of aphasia.  Able to follow 3 step commands without difficulty. Attention span and concentration seemed appropriate  Cranial Nerves: II: Discs flat bilaterally; Visual fields grossly normal, pupils equal, round, reactive to light and accommodation III,IV, VI: ptosis not present, extra-ocular motions intact bilaterally V,VII: mild left facial  droop, facial light touch sensation intact VIII: hearing normal bilaterally IX,X: gag reflex present XI: bilateral shoulder shrug XII: midline tongue extension Motor: Right :  Upper extremity   5/5 Without pronator drift      Left: Upper extremity   5/5 without pronator drift Right:  Lower extremity   5/5                                          Left: Lower extremity   5-/5 Tone and bulk:normal tone throughout; no atrophy noted Sensory: Pinprick and light touch intact bilaterally Deep Tendon Reflexes: 2+ and symmetric throughout Plantars: Right: downgoing                              Left: downgoing Cerebellar: Finger-to-nose testing intact bilaterally. Heel to shin testing normal bilaterally Gait: not tested due to safety concerns  Data Reviewed  Laboratory Studies:  Basic Metabolic Panel: Recent Labs  Lab 11/22/17 1410 11/23/17 0400  NA 142 143  K 3.2* 3.3*  CL 105 110  CO2 26 28  GLUCOSE 125* 108*  BUN 12 15  CREATININE 0.65 0.59  CALCIUM 8.2* 8.1*    Liver Function Tests: Recent Labs  Lab 11/22/17 1410  AST 29  ALT 43  ALKPHOS 124  BILITOT 3.8*  PROT 6.6  ALBUMIN 3.8   No results for input(s): LIPASE, AMYLASE in the last 168 hours. No results for input(s): AMMONIA in the last 168 hours.  CBC: Recent Labs  Lab 11/22/17 1410 11/23/17 0400  WBC 5.8 5.0  HGB 12.0 11.6*  HCT 33.2* 32.1*  MCV 89.2 89.9  PLT 153 150    Cardiac Enzymes: Recent Labs  Lab 11/22/17 1410 11/22/17 1622 11/22/17 2131 11/23/17 0400  TROPONINI <0.03 <0.03 <0.03 <0.03    BNP: Invalid input(s): POCBNP  CBG: Recent Labs  Lab 11/22/17 1605 11/22/17 2125 11/23/17 0357 11/23/17 0739  GLUCAP 176* 120* 94 92    Microbiology: Results for orders placed or performed during the hospital encounter of 11/02/16  Gastrointestinal Panel by PCR , Stool     Status: None   Collection Time: 11/02/16  4:53 PM  Result Value Ref Range Status   Campylobacter species NOT DETECTED  NOT DETECTED Final   Plesimonas shigelloides NOT DETECTED NOT DETECTED Final   Salmonella species NOT DETECTED NOT DETECTED Final   Yersinia enterocolitica NOT DETECTED NOT DETECTED Final   Vibrio species NOT DETECTED NOT DETECTED Final   Vibrio cholerae NOT DETECTED NOT DETECTED Final   Enteroaggregative E coli (EAEC) NOT DETECTED NOT DETECTED Final   Enteropathogenic E coli (EPEC) NOT DETECTED NOT DETECTED Final   Enterotoxigenic E coli (ETEC) NOT DETECTED NOT DETECTED Final   Shiga like toxin producing E coli (STEC) NOT DETECTED NOT DETECTED Final   Shigella/Enteroinvasive E coli (EIEC) NOT DETECTED NOT DETECTED Final   Cryptosporidium NOT DETECTED NOT DETECTED Final   Cyclospora cayetanensis NOT DETECTED NOT DETECTED Final   Entamoeba histolytica NOT DETECTED NOT DETECTED Final   Giardia lamblia NOT DETECTED NOT DETECTED Final   Adenovirus F40/41 NOT DETECTED NOT DETECTED Final   Astrovirus NOT DETECTED NOT DETECTED Final   Norovirus GI/GII NOT DETECTED NOT DETECTED Final   Rotavirus A NOT DETECTED NOT DETECTED Final   Sapovirus (I, II, IV, and V) NOT DETECTED NOT DETECTED Final  C difficile quick scan w PCR reflex     Status: None   Collection Time: 11/02/16  4:53 PM  Result Value Ref Range Status   C Diff antigen NEGATIVE NEGATIVE Final   C Diff toxin NEGATIVE NEGATIVE Final   C Diff interpretation No C. difficile  detected.  Final    Coagulation Studies: No results for input(s): LABPROT, INR in the last 72 hours.  Urinalysis: No results for input(s): COLORURINE, LABSPEC, PHURINE, GLUCOSEU, HGBUR, BILIRUBINUR, KETONESUR, PROTEINUR, UROBILINOGEN, NITRITE, LEUKOCYTESUR in the last 168 hours.  Invalid input(s): APPERANCEUR  Lipid Panel:    Component Value Date/Time   CHOL 113 11/23/2017 0400   TRIG 38 11/23/2017 0400   HDL 43 11/23/2017 0400   CHOLHDL 2.6 11/23/2017 0400   VLDL 8 11/23/2017 0400   LDLCALC 62 11/23/2017 0400    HgbA1C:  Lab Results  Component Value  Date   HGBA1C 8.0 (H) 11/22/2017    Urine Drug Screen:      Component Value Date/Time   LABOPIA NONE DETECTED 10/28/2015 1831   COCAINSCRNUR NONE DETECTED 10/28/2015 1831   LABBENZ NONE DETECTED 10/28/2015 1831   AMPHETMU NONE DETECTED 10/28/2015 1831   THCU NONE DETECTED 10/28/2015 1831   LABBARB NONE DETECTED 10/28/2015 1831    Alcohol Level: No results for input(s): ETH in the last 168 hours.  Other results: BJS:EGBTD rhythm at 88 bpm.  Imaging: Dg Chest 2 View  Result Date: 11/22/2017 CLINICAL DATA:  Chest pain. EXAM: CHEST - 2 VIEW COMPARISON:  Radiographs of June 02, 2017. FINDINGS: The heart size and mediastinal contours are within normal limits. Both lungs are clear. No pneumothorax or pleural effusion is noted. Right internal jugular Port-A-Cath is unchanged in position. The visualized skeletal structures are unremarkable. IMPRESSION: No active cardiopulmonary disease. Electronically Signed   By: Marijo Conception, M.D.   On: 11/22/2017 14:19   Ct Head Wo Contrast  Result Date: 11/22/2017 CLINICAL DATA:  Generalized weakness and left-sided arm pain for several hours, no known injury, initial encounter EXAM: CT HEAD WITHOUT CONTRAST TECHNIQUE: Contiguous axial images were obtained from the base of the skull through the vertex without intravenous contrast. COMPARISON:  MRI from 10/28/2015 FINDINGS: Brain: Mild chronic white matter ischemic changes noted. No findings to suggest acute hemorrhage, acute infarction or space-occupying mass lesion are noted. Vascular: No hyperdense vessel or unexpected calcification. Skull: Normal. Negative for fracture or focal lesion. Sinuses/Orbits: No acute finding. Other: None. IMPRESSION: Chronic white matter ischemic changes stable from previous MRI. No acute abnormality noted. Electronically Signed   By: Inez Catalina M.D.   On: 11/22/2017 16:03   Mr Brain Wo Contrast  Result Date: 11/23/2017 CLINICAL DATA:  Slurred speech for 1 week. New onset  left leg weakness. EXAM: MRI HEAD WITHOUT CONTRAST TECHNIQUE: Multiplanar, multiecho pulse sequences of the brain and surrounding structures were obtained without intravenous contrast. COMPARISON:  Head CT 11/22/2017 Brain MRI 10/28/2015 FINDINGS: BRAIN: There is no acute infarct, acute hemorrhage, hydrocephalus or extra-axial collection. The midline structures are normal. No midline shift or other mass effect. There are no old infarcts. Multifocal white matter hyperintensity, most commonly due to chronic ischemic microangiopathy. The cerebral and cerebellar volume are age-appropriate. Susceptibility-sensitive sequences show no chronic microhemorrhage or superficial siderosis. VASCULAR: Major intracranial arterial and venous sinus flow voids are normal. SKULL AND UPPER CERVICAL SPINE: Calvarial bone marrow signal is normal. There is no skull base mass. Visualized upper cervical spine and soft tissues are normal. SINUSES/ORBITS: No fluid levels or advanced mucosal thickening. No mastoid or middle ear effusion. The orbits are normal. IMPRESSION: Unchanged appearance of chronic white matter disease likely secondary to chronic ischemic microangiopathy. No acute abnormality. Electronically Signed   By: Ulyses Jarred M.D.   On: 11/23/2017 00:55    Patient seen and examined.  Clinical course and management discussed.  Necessary edits performed.  I agree with the above.  Assessment and plan of care developed and discussed below   Assessment: 52 y.o. female with pertinent history of nonalcoholic cirrhosis, pancreatitis status post partial pancreatectomy, insulin-dependent diabetes mellitus , anemia, and multiple other medical problems presenting with chief complaint of chest pain.  Also reports slurred speech and LLE weakness that are intermittent occurring multiple times per day but independent of each other.  Reports speech at baseline currently.  MRI brain reviewed and did not show any acute intracranial  abnormalities. Echocardiogram showed no cardiac source of emboli.  Carotid dopplers show no hemodynamically significant stenosis.  HgbA1c 8.0, LDL 62.  Patient currently on ASA.  TSH normal.  Stroke Risk Factors - diabetes mellitus, hypercoagulable state and hypertension  Plan: 1.  Unclear if patient may be having TIAs.  On ASA.  Would change to Plavix 49m daily 2.  Would rule out spinal stenosis with MRI of the lumbar spine.  3.  Blood sugar management with target A1c<7.0  4.  B12  This patient was staffed with Dr. LMagda Paganini RDoy Mincewho personally evaluated patient, reviewed documentation and agreed with assessment and plan of care as above.  ERufina Falco DNP, FNP-BC Board certified Nurse Practitioner Neurology Department   11/23/2017, 8:14 AM  LAlexis Goodell MD Neurology [phone] 11/23/2017  2:48 PM

## 2017-11-23 NOTE — Progress Notes (Signed)
Dr. Benjie Karvonen notified that MRI is backed up and it will be several hours before they get to her. Instructed to cancel discharge and start patient on 69m Plavix daily to begin tomorrow. Will update patient of delay and cancelled discharge.

## 2017-11-23 NOTE — Progress Notes (Signed)
Patient with new onset LLQ pain, vomiting undigested food, and appears jaundice compared to this morning. Dr. Benjie Karvonen notified. Order for 33m IV phenergan q6h PRN, STAT lipase level and  STAT KUB.

## 2017-11-23 NOTE — Care Management Obs Status (Signed)
[facility] NOTIFICATION   Patient Details  Name: Amber Christensen MRN: [id number] Date of Birth: 1965-10-14   Medicare Observation Status Notification Given:  Yes    Elza Rafter, RN 11/23/2017, 3:58 PM

## 2017-11-23 NOTE — Discharge Summary (Addendum)
[facility] [place] NAME: Amber Christensen    MR#:  [id number]  DATE OF BIRTH:  Feb 18, 1965  DATE OF ADMISSION:  11/22/2017 ADMITTING PHYSICIAN: Nicholes Mango, MD  DATE OF DISCHARGE: 11/24/2017  PRIMARY CARE PHYSICIAN: Glendon Axe, MD    ADMISSION DIAGNOSIS:  Unstable angina ([place]) [I20.0]  DISCHARGE DIAGNOSIS:  Active Problems: Chest pain   SECONDARY DIAGNOSIS:   Past Medical History:  Diagnosis Date  . Allergy   . Anemia   . Anxiety   . 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 Blood transfusion without reported diagnosis   . Cervical cancer ([place])   . Clotting disorder ([place])   . Depression   . Diabetes mellitus without complication ([place])   . GERD (gastroesophageal reflux disease)   . Heart disease   . 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 Hypertension   . Liver disease   . Pancreatitis   . Thyroid disease     HOSPITAL COURSE:  52 year old female with history of nonalcoholic liver cirrhosis and chronic pancreatitis with diabetes on insulin pump who presented to the ER with chest pain.  1.  Chest pain: Patient was evaluated cardiology.  Patient was ruled out for ACS with negative troponins.  Patient underwent cardiac stress testing was normal. Echocardiogram showed no wall motion abnormalities, however did show LVH with diastolic dysfunction.  2.  Diabetes: Patient continue with outpatient regimen and ADA diet  3.  Essential hypertension: Patient has signs of heart strain on echocardiogram with LVH and diastolic dysfunction.  Patient needs close monitoring her blood pressure.  She will continue on outpatient regimen including Norvasc and lisinopril.  4.  Hypokalemia: This was repleted  5.  GERD: Continue PPI  6.  Slurred speech with left arm pain: Patient was evaluated by neurology.  As per neurology it is unclear if patient is having TIAs and recommended changing aspirin to Plavix.  She also underwent MRI of the lumbar spine which shows disc  protrusion which may be causing left-sided weakness.  She was evaluated by neurosurgery who recommended IR to perform steroid injection.  She will have outpatient neurosurgery follow-up.   Neurology feels that she may have bilateral meralgia parastetica. A1c is 8.0 LDL 62   7.  Chronic constipation: This is been an ongoing issue and patient is followed up with GI. DISCHARGE CONDITIONS AND DIET:   Stable for discharge Heart healthy diet  CONSULTS OBTAINED:  Treatment Team:  Teodoro Spray, MD Alexis Goodell, MD Meade Maw, MD  DRUG ALLERGIES:   Allergies  Allergen Reactions  . Metoclopramide Hives  . Morphine And Related Anaphylaxis and Other (See Comments)    Pt states that this medication causes cardiac arrest.    . Morpholine Salicylate Nausea And Vomiting, Other (See Comments) and Rash    CHF  . Latex Rash  . Demerol [Meperidine] Hives and Other (See Comments)    Pt states that this medication causes cardiac arrest.    . Dilaudid [Hydromorphone Hcl] Nausea And Vomiting and Other (See Comments)    Pt states that this medication causes cardiac arrest.    . Isosorbide Nitrate Other (See Comments)    Other reaction(s): Headache   . Sulfa Antibiotics Nausea And Vomiting  . Darvon [Propoxyphene] Nausea And Vomiting and Rash  . Flexeril [Cyclobenzaprine] Nausea And Vomiting and Rash  . Levaquin [Levofloxacin In D5w] Nausea And Vomiting and Rash  . Nsaids Rash and Other (See Comments)    Naproxen causes a  rash; patient is able to tolerate Advil   . Soma [Carisoprodol] Nausea And Vomiting and Rash  . Zofran [Ondansetron Hcl] Nausea And Vomiting and Rash    DISCHARGE MEDICATIONS:   Allergies as of 11/24/2017      Reactions   Metoclopramide Hives   Morphine And Related Anaphylaxis, Other (See Comments)   Pt states that this medication causes cardiac arrest.     Morpholine Salicylate Nausea And Vomiting, Other (See Comments), Rash   CHF   Latex Rash   Demerol  [meperidine] Hives, Other (See Comments)   Pt states that this medication causes cardiac arrest.     Dilaudid [hydromorphone Hcl] Nausea And Vomiting, Other (See Comments)   Pt states that this medication causes cardiac arrest.     Isosorbide Nitrate Other (See Comments)   Other reaction(s): Headache   Sulfa Antibiotics Nausea And Vomiting   Darvon [propoxyphene] Nausea And Vomiting, Rash   Flexeril [cyclobenzaprine] Nausea And Vomiting, Rash   Levaquin [levofloxacin In D5w] Nausea And Vomiting, Rash   Nsaids Rash, Other (See Comments)   Naproxen causes a rash; patient is able to tolerate Advil   Soma [carisoprodol] Nausea And Vomiting, Rash   Zofran [ondansetron Hcl] Nausea And Vomiting, Rash      Medication List    STOP taking these medications   aspirin EC 81 MG tablet   oxyCODONE 5 MG immediate release tablet Commonly known as:  Oxy IR/ROXICODONE   pantoprazole 40 MG tablet Commonly known as:  PROTONIX     TAKE these medications   amLODipine 5 MG tablet Commonly known as:  NORVASC Take 5 mg by mouth daily.   CALCIUM 600/VITAMIN D3 PO Take 1 tablet by mouth daily.   clopidogrel 75 MG tablet Commonly known as:  PLAVIX Take 1 tablet (75 mg total) by mouth daily. Start taking on:  11/25/2017   CREON 36000 UNITS Cpep capsule Generic drug:  lipase/protease/amylase Take 108,000 Units by mouth 3 (three) times daily before meals.   Dexlansoprazole 30 MG capsule Take 30 mg by mouth daily.   hydrochlorothiazide 12.5 MG tablet Commonly known as:  HYDRODIURIL Take 12.5 mg by mouth daily.   lisinopril 40 MG tablet Commonly known as:  PRINIVIL,ZESTRIL Take 40 mg by mouth daily.   naproxen sodium 220 MG tablet Commonly known as:  ALEVE Take 220-440 mg by mouth 2 (two) times daily as needed (pain).   NOVOLOG 100 UNIT/ML injection Generic drug:  insulin aspart Inject 40 Units into the skin See admin instructions. Up to 40u via insulin pump.   Vitamin D-3 5000 units  Tabs Take 5,000 Units by mouth daily.   vitamin E 400 UNIT capsule Take 400 Units by mouth daily.         Today   CHIEF COMPLAINT:  No chest pain overnight   VITAL SIGNS:  Blood pressure (!) 144/92, pulse 98, temperature 97.8 F (36.6 C), temperature source Oral, resp. rate 18, height 5' 4"  (1.626 m), weight 63.5 kg, SpO2 97 %.   REVIEW OF SYSTEMS:  Review of Systems  Constitutional: Negative.  Negative for chills, fever and malaise/fatigue.  HENT: Negative.  Negative for ear discharge, ear pain, hearing loss, nosebleeds and sore throat.   Eyes: Negative.  Negative for blurred vision and pain.  Respiratory: Negative.  Negative for cough, hemoptysis, shortness of breath and wheezing.   Cardiovascular: Negative.  Negative for chest pain, palpitations and leg swelling.  Gastrointestinal: Negative.  Negative for abdominal pain, blood in stool, diarrhea, nausea  and vomiting.  Genitourinary: Negative.  Negative for dysuria.  Musculoskeletal: Negative.  Negative for back pain.  Skin: Negative.   Neurological: Negative for dizziness, tremors, speech change, focal weakness, seizures and headaches.  Endo/Heme/Allergies: Negative.  Does not bruise/bleed easily.  Psychiatric/Behavioral: Negative.  Negative for depression, hallucinations and suicidal ideas.     PHYSICAL EXAMINATION:  GENERAL:  52 y.o.-year-old patient lying in the bed with no acute distress.  NECK:  Supple, no jugular venous distention. No thyroid enlargement, no tenderness.  LUNGS: Normal breath sounds bilaterally, no wheezing, rales,rhonchi  No use of accessory muscles of respiration.  CARDIOVASCULAR: S1, S2 normal. No murmurs, rubs, or gallops.  ABDOMEN: Soft, non-tender, non-distended. Bowel sounds present. No organomegaly or mass.  EXTREMITIES: No pedal edema, cyanosis, or clubbing.  PSYCHIATRIC: The patient is alert and oriented x 3.  SKIN: No obvious rash, lesion, or ulcer.   DATA REVIEW:   CBC Recent  Labs  Lab 11/23/17 0400  WBC 5.0  HGB 11.6*  HCT 32.1*  PLT 150    Chemistries  Recent Labs  Lab 11/22/17 1410 11/23/17 0400  NA 142 143  K 3.2* 3.3*  CL 105 110  CO2 26 28  GLUCOSE 125* 108*  BUN 12 15  CREATININE 0.65 0.59  CALCIUM 8.2* 8.1*  AST 29  --   ALT 43  --   ALKPHOS 124  --   BILITOT 3.8*  --     Cardiac Enzymes Recent Labs  Lab 11/22/17 1622 11/22/17 2131 11/23/17 0400  TROPONINI <0.03 <0.03 <0.03    Microbiology Results  @[email or web address] @  RADIOLOGY:  Dg Chest 2 View  Result Date: 11/22/2017 CLINICAL DATA:  Chest pain. EXAM: CHEST - 2 VIEW COMPARISON:  Radiographs of June 02, 2017. FINDINGS: The heart size and mediastinal contours are within normal limits. Both lungs are clear. No pneumothorax or pleural effusion is noted. Right internal jugular Port-A-Cath is unchanged in position. The visualized skeletal structures are unremarkable. IMPRESSION: No active cardiopulmonary disease. Electronically Signed   By: Marijo Conception, M.D.   On: 11/22/2017 14:19   Dg Abd 1 View  Result Date: 11/23/2017 CLINICAL DATA:  Abdominal pain, vomiting EXAM: ABDOMEN - 1 VIEW COMPARISON:  06/02/2017 FINDINGS: Large stool burden throughout the colon. Nonobstructive bowel gas pattern. No free air or organomegaly. Prior cholecystectomy. No acute bony abnormality. Large object projects over the the mid pelvis, presumably external to the patient. IMPRESSION: Large stool burden throughout the colon.  No acute findings. Electronically Signed   By: Rolm Baptise M.D.   On: 11/23/2017 18:39   Ct Head Wo Contrast  Result Date: 11/22/2017 CLINICAL DATA:  Generalized weakness and left-sided arm pain for several hours, no known injury, initial encounter EXAM: CT HEAD WITHOUT CONTRAST TECHNIQUE: Contiguous axial images were obtained from the base of the skull through the vertex without intravenous contrast. COMPARISON:  MRI from 10/28/2015 FINDINGS: Brain: Mild chronic white matter  ischemic changes noted. No findings to suggest acute hemorrhage, acute infarction or space-occupying mass lesion are noted. Vascular: No hyperdense vessel or unexpected calcification. Skull: Normal. Negative for fracture or focal lesion. Sinuses/Orbits: No acute finding. Other: None. IMPRESSION: Chronic white matter ischemic changes stable from previous MRI. No acute abnormality noted. Electronically Signed   By: Inez Catalina M.D.   On: 11/22/2017 16:03   Mr Brain Wo Contrast  Result Date: 11/23/2017 CLINICAL DATA:  Slurred speech for 1 week. New onset left leg weakness. EXAM: MRI HEAD WITHOUT CONTRAST TECHNIQUE: Multiplanar, multiecho  pulse sequences of the brain and surrounding structures were obtained without intravenous contrast. COMPARISON:  Head CT 11/22/2017 Brain MRI 10/28/2015 FINDINGS: BRAIN: There is no acute infarct, acute hemorrhage, hydrocephalus or extra-axial collection. The midline structures are normal. No midline shift or other mass effect. There are no old infarcts. Multifocal white matter hyperintensity, most commonly due to chronic ischemic microangiopathy. The cerebral and cerebellar volume are age-appropriate. Susceptibility-sensitive sequences show no chronic microhemorrhage or superficial siderosis. VASCULAR: Major intracranial arterial and venous sinus flow voids are normal. SKULL AND UPPER CERVICAL SPINE: Calvarial bone marrow signal is normal. There is no skull base mass. Visualized upper cervical spine and soft tissues are normal. SINUSES/ORBITS: No fluid levels or advanced mucosal thickening. No mastoid or middle ear effusion. The orbits are normal. IMPRESSION: Unchanged appearance of chronic white matter disease likely secondary to chronic ischemic microangiopathy. No acute abnormality. Electronically Signed   By: Ulyses Jarred M.D.   On: 11/23/2017 00:55   Mr Lumbar Spine Wo Contrast  Result Date: 11/23/2017 CLINICAL DATA:  Initial evaluation for left lower extremity  weakness for 1 week. EXAM: MRI LUMBAR SPINE WITHOUT CONTRAST TECHNIQUE: Multiplanar, multisequence MR imaging of the lumbar spine was performed. No intravenous contrast was administered. COMPARISON:  Prior radiograph from 11/23/2017. FINDINGS: Segmentation: Normal segmentation. Lowest well-formed disc labeled the L5-S1 level. Alignment: Mild straightening of the normal lumbar lordosis. No listhesis. Vertebrae: Vertebral body heights maintained without evidence for acute or chronic fracture. Underlying bone marrow signal intensity within normal limits. Subcentimeter benign hemangioma noted within the L5 vertebral body. No other discrete or worrisome osseous lesions. No abnormal marrow edema. Conus medullaris and cauda equina: Conus extends to the L1-2 level. Conus and cauda equina appear normal. Paraspinal and other soft tissues: Paraspinous soft tissues within normal limits. 19 mm T2 hyperintense left renal cyst noted. Visualized visceral structures otherwise unremarkable. Disc levels: L1-2:  Unremarkable. L2-3:  Unremarkable. L3-4:  Unremarkable. L4-5: Mild annular disc bulge with disc desiccation. Superimposed shallow left extraforaminal disc protrusion with associated annular fissure, closely approximating the exiting left L4 nerve root without neural impingement or displacement (series 8, image 28). Minimal facet hypertrophy. No canal stenosis. Mild bilateral L4 foraminal narrowing. L5-S1:  Unremarkable. IMPRESSION: 1. Shallow left extraforaminal disc protrusion with associated annular fissure at L4-5, closely approximating and potentially irritating the exiting left L4 nerve root. 2. Mild bilateral L4 foraminal stenosis due to disc bulge and facet hypertrophy. 3. Otherwise unremarkable MRI of the lumbar spine. Electronically Signed   By: Jeannine Boga M.D.   On: 11/23/2017 21:48   US Carotid Bilateral  Result Date: 11/23/2017 CLINICAL DATA:  Dysarthria EXAM: BILATERAL CAROTID DUPLEX ULTRASOUND  TECHNIQUE: Pearline Cables scale imaging, color Doppler and duplex ultrasound were performed of bilateral carotid and vertebral arteries in the neck. COMPARISON:  None. FINDINGS: Criteria: Quantification of carotid stenosis is based on velocity parameters that correlate the residual internal carotid diameter with NASCET-based stenosis levels, using the diameter of the distal internal carotid lumen as the denominator for stenosis measurement. The following velocity measurements were obtained: RIGHT ICA: 111 cm/sec CCA: 161 cm/sec SYSTOLIC ICA/CCA RATIO:  1.1 ECA: 116 cm/sec LEFT ICA: 92 cm/sec CCA: 096 cm/sec SYSTOLIC ICA/CCA RATIO:  0.9 ECA: 88 cm/sec RIGHT CAROTID ARTERY: Minimal soft intimal thickening in the bulb. Low resistance internal carotid Doppler pattern. RIGHT VERTEBRAL ARTERY:  Antegrade. LEFT CAROTID ARTERY: Minimal intimal thickening in the bulb. Low resistance internal carotid Doppler pattern. LEFT VERTEBRAL ARTERY:  Antegrade. IMPRESSION: Less than 50% stenosis in  the right and left internal carotid arteries. Electronically Signed   By: Marybelle Killings M.D.   On: 11/23/2017 11:47   Nm Myocar Multi W/spect W/wall Motion / Ef  Result Date: 11/23/2017  Blood pressure demonstrated a normal response to exercise.  There was no ST segment deviation noted during stress.  The study is normal.  The left ventricular ejection fraction is hyperdynamic (>65%).       Allergies as of 11/24/2017      Reactions   Metoclopramide Hives   Morphine And Related Anaphylaxis, Other (See Comments)   Pt states that this medication causes cardiac arrest.     Morpholine Salicylate Nausea And Vomiting, Other (See Comments), Rash   CHF   Latex Rash   Demerol [meperidine] Hives, Other (See Comments)   Pt states that this medication causes cardiac arrest.     Dilaudid [hydromorphone Hcl] Nausea And Vomiting, Other (See Comments)   Pt states that this medication causes cardiac arrest.     Isosorbide Nitrate Other (See  Comments)   Other reaction(s): Headache   Sulfa Antibiotics Nausea And Vomiting   Darvon [propoxyphene] Nausea And Vomiting, Rash   Flexeril [cyclobenzaprine] Nausea And Vomiting, Rash   Levaquin [levofloxacin In D5w] Nausea And Vomiting, Rash   Nsaids Rash, Other (See Comments)   Naproxen causes a rash; patient is able to tolerate Advil   Soma [carisoprodol] Nausea And Vomiting, Rash   Zofran [ondansetron Hcl] Nausea And Vomiting, Rash      Medication List    STOP taking these medications   aspirin EC 81 MG tablet   oxyCODONE 5 MG immediate release tablet Commonly known as:  Oxy IR/ROXICODONE   pantoprazole 40 MG tablet Commonly known as:  PROTONIX     TAKE these medications   amLODipine 5 MG tablet Commonly known as:  NORVASC Take 5 mg by mouth daily.   CALCIUM 600/VITAMIN D3 PO Take 1 tablet by mouth daily.   clopidogrel 75 MG tablet Commonly known as:  PLAVIX Take 1 tablet (75 mg total) by mouth daily. Start taking on:  11/25/2017   CREON 36000 UNITS Cpep capsule Generic drug:  lipase/protease/amylase Take 108,000 Units by mouth 3 (three) times daily before meals.   Dexlansoprazole 30 MG capsule Take 30 mg by mouth daily.   hydrochlorothiazide 12.5 MG tablet Commonly known as:  HYDRODIURIL Take 12.5 mg by mouth daily.   lisinopril 40 MG tablet Commonly known as:  PRINIVIL,ZESTRIL Take 40 mg by mouth daily.   naproxen sodium 220 MG tablet Commonly known as:  ALEVE Take 220-440 mg by mouth 2 (two) times daily as needed (pain).   NOVOLOG 100 UNIT/ML injection Generic drug:  insulin aspart Inject 40 Units into the skin See admin instructions. Up to 40u via insulin pump.   Vitamin D-3 5000 units Tabs Take 5,000 Units by mouth daily.   vitamin E 400 UNIT capsule Take 400 Units by mouth daily.         Management plans discussed with the patient and she is in agreement. Stable for discharge home  Patient should follow up with cardiology  CODE  STATUS:     Code Status Orders  (From admission, onward)         Start     Ordered   11/22/17 1556  Full code  Continuous     11/22/17 1556        Code Status History    Date Active Date Inactive Code Status Order ID Comments User  Context   08/03/2016 1709 08/06/2016 1842 Full Code [id number]  Gladstone Lighter, MD ED   04/20/2016 1510 04/22/2016 1935 Full Code [id number]  Fritzi Mandes, MD Inpatient   09/28/2015 1509 09/29/2015 2025 Full Code [id number]  Bettey Costa, MD Inpatient    Advance Directive Documentation     Most Recent Value  Type of Advance Directive  Healthcare Power of Attorney  Pre-existing out of facility DNR order (yellow form or pink MOST form)  -  "MOST" Form in Place?  -      TOTAL TIME TAKING CARE OF THIS PATIENT: 38 minutes.    Note: This dictation was prepared with Dragon dictation along with smaller phrase technology. Any transcriptional errors that result from this process are unintentional.  Endy Easterly M.D on 11/24/2017 at 10:47 AM  Between 7am to 6pm - Pager - [phone] After 6pm go to [email or web address] - password [email or web address] [facility]  Office  [phone]  CC: Primary care physician; Glendon Axe, MD

## 2017-11-23 NOTE — Progress Notes (Signed)
Patient refused bed alarm. Educated on safety.

## 2017-11-23 NOTE — Progress Notes (Signed)
Insulin pump site changed by patient to right lower quadrant. No issues or concerns at this time.

## 2017-11-24 DIAGNOSIS — R471 Dysarthria and anarthria: Secondary | ICD-10-CM

## 2017-11-24 DIAGNOSIS — I200 Unstable angina: Secondary | ICD-10-CM | POA: Diagnosis not present

## 2017-11-24 LAB — VITAMIN B12: Vitamin B-12: 65 pg/mL — ABNORMAL LOW (ref 180–914)

## 2017-11-24 LAB — GLUCOSE, CAPILLARY
Glucose-Capillary: 171 mg/dL — ABNORMAL HIGH (ref 70–99)
Glucose-Capillary: 142 mg/dL — ABNORMAL HIGH (ref 70–99)
Glucose-Capillary: 302 mg/dL — ABNORMAL HIGH (ref 70–99)

## 2017-11-24 MED ORDER — CLOPIDOGREL BISULFATE 75 MG PO TABS: 75.0000 mg | ORAL_TABLET | Freq: Every day | ORAL | 0 refills | Status: DC

## 2017-11-24 MED ORDER — PREDNISONE 10 MG PO TABS: 10.0000 mg | ORAL_TABLET | Freq: Every day | ORAL | 0 refills | Status: DC

## 2017-11-24 MED ORDER — INFLUENZA VAC SPLIT QUAD 0.5 ML IM SUSY
0.5000 mL | PREFILLED_SYRINGE | INTRAMUSCULAR | Status: DC
  Filled 2017-11-24: qty 0.5

## 2017-11-24 MED ORDER — SODIUM CHLORIDE 0.9% FLUSH
10.0000 mL | Freq: Two times a day (BID) | INTRAVENOUS | Status: DC
  Administered 2017-11-24: 10 mL
  Administered 2017-11-24: 20 mL

## 2017-11-24 MED ORDER — SODIUM CHLORIDE 0.9% FLUSH: 10.0000 mL | INTRAVENOUS | Status: DC | PRN

## 2017-11-24 MED ORDER — HEPARIN SOD (PORK) LOCK FLUSH 100 UNIT/ML IV SOLN
500.0000 [IU] | Freq: Once | INTRAVENOUS | Status: AC
  Administered 2017-11-24: 500 [IU] via INTRAVENOUS
  Filled 2017-11-24: qty 5

## 2017-11-24 MED ORDER — INFLUENZA VAC SPLIT QUAD 0.5 ML IM SUSY: 0.5000 mL | PREFILLED_SYRINGE | INTRAMUSCULAR | Status: DC

## 2017-11-24 NOTE — Progress Notes (Signed)
Awakened for blood draws from port.  Declines nitro patch due to headache.  Reports no bowel movement since suppository.

## 2017-11-24 NOTE — Progress Notes (Signed)
Subjective: Patient reports feeling improved today.  Does have LE pain but no weakness  Objective: Current vital signs: BP (!) 144/92   Pulse 98   Temp 97.8 F (36.6 C) (Oral)   Resp 18   Ht 5' 4"  (1.626 m)   Wt 63.5 kg   SpO2 97%   BMI 24.03 kg/m  Vital signs in last 24 hours: Temp:  [97.8 F (36.6 C)-98.6 F (37 C)] 97.8 F (36.6 C) (10/11 0843) Pulse Rate:  [81-98] 98 (10/11 0843) Resp:  [14-18] 18 (10/11 0843) BP: (118-147)/(82-93) 144/92 (10/11 0843) SpO2:  [97 %-98 %] 97 % (10/11 0843)  Intake/Output from previous day: 10/10 0701 - 10/11 0700 In: -  Out: 1000 [Urine:1000] Intake/Output this shift: No intake/output data recorded. Nutritional status:  Diet Order            Diet heart healthy/carb modified Room service appropriate? Yes; Fluid consistency: Thin  Diet effective now              Neurologic Exam: Mental Status: Alert, oriented, thought content appropriate. Speech fluent without evidence of aphasia. Able to follow 3 step commands without difficulty. Attention span and concentration seemed appropriate  Cranial Nerves: II: Discs flat bilaterally; Visual fields grossly normal, pupils equal, round, reactive to light and accommodation III,IV, VI: ptosis not present, extra-ocular motions intact bilaterally V,VII: mild left facial droop, facial light touch sensationintact VIII: hearing normal bilaterally IX,X: gag reflex present XI: bilateral shoulder shrug XII: midline tongue extension Motor: 5/5 throughout  Lab Results: Basic Metabolic Panel: Recent Labs  Lab 11/22/17 1410 11/23/17 0400  NA 142 143  K 3.2* 3.3*  CL 105 110  CO2 26 28  GLUCOSE 125* 108*  BUN 12 15  CREATININE 0.65 0.59  CALCIUM 8.2* 8.1*    Liver Function Tests: Recent Labs  Lab 11/22/17 1410  AST 29  ALT 43  ALKPHOS 124  BILITOT 3.8*  PROT 6.6  ALBUMIN 3.8   Recent Labs  Lab 11/23/17 1825  LIPASE 21   No results for input(s): AMMONIA in the last 168  hours.  CBC: Recent Labs  Lab 11/22/17 1410 11/23/17 0400  WBC 5.8 5.0  HGB 12.0 11.6*  HCT 33.2* 32.1*  MCV 89.2 89.9  PLT 153 150    Cardiac Enzymes: Recent Labs  Lab 11/22/17 1410 11/22/17 1622 11/22/17 2131 11/23/17 0400  TROPONINI <0.03 <0.03 <0.03 <0.03    Lipid Panel: Recent Labs  Lab 11/23/17 0400  CHOL 113  TRIG 38  HDL 43  CHOLHDL 2.6  VLDL 8  LDLCALC 62    CBG: Recent Labs  Lab 11/23/17 2059 11/23/17 2242 11/24/17 0224 11/24/17 0722 11/24/17 1144  GLUCAP 153* 237* 171* 142* 302*    Microbiology: Results for orders placed or performed during the hospital encounter of 11/02/16  Gastrointestinal Panel by PCR , Stool     Status: None   Collection Time: 11/02/16  4:53 PM  Result Value Ref Range Status   Campylobacter species NOT DETECTED NOT DETECTED Final   Plesimonas shigelloides NOT DETECTED NOT DETECTED Final   Salmonella species NOT DETECTED NOT DETECTED Final   Yersinia enterocolitica NOT DETECTED NOT DETECTED Final   Vibrio species NOT DETECTED NOT DETECTED Final   Vibrio cholerae NOT DETECTED NOT DETECTED Final   Enteroaggregative E coli (EAEC) NOT DETECTED NOT DETECTED Final   Enteropathogenic E coli (EPEC) NOT DETECTED NOT DETECTED Final   Enterotoxigenic E coli (ETEC) NOT DETECTED NOT DETECTED Final   Shiga  like toxin producing E coli (STEC) NOT DETECTED NOT DETECTED Final   Shigella/Enteroinvasive E coli (EIEC) NOT DETECTED NOT DETECTED Final   Cryptosporidium NOT DETECTED NOT DETECTED Final   Cyclospora cayetanensis NOT DETECTED NOT DETECTED Final   Entamoeba histolytica NOT DETECTED NOT DETECTED Final   Giardia lamblia NOT DETECTED NOT DETECTED Final   Adenovirus F40/41 NOT DETECTED NOT DETECTED Final   Astrovirus NOT DETECTED NOT DETECTED Final   Norovirus GI/GII NOT DETECTED NOT DETECTED Final   Rotavirus A NOT DETECTED NOT DETECTED Final   Sapovirus (I, II, IV, and V) NOT DETECTED NOT DETECTED Final  C difficile quick scan  w PCR reflex     Status: None   Collection Time: 11/02/16  4:53 PM  Result Value Ref Range Status   C Diff antigen NEGATIVE NEGATIVE Final   C Diff toxin NEGATIVE NEGATIVE Final   C Diff interpretation No C. difficile detected.  Final    Coagulation Studies: No results for input(s): LABPROT, INR in the last 72 hours.  Imaging: Dg Abd 1 View  Result Date: 11/23/2017 CLINICAL DATA:  Abdominal pain, vomiting EXAM: ABDOMEN - 1 VIEW COMPARISON:  06/02/2017 FINDINGS: Large stool burden throughout the colon. Nonobstructive bowel gas pattern. No free air or organomegaly. Prior cholecystectomy. No acute bony abnormality. Large object projects over the the mid pelvis, presumably external to the patient. IMPRESSION: Large stool burden throughout the colon.  No acute findings. Electronically Signed   By: Rolm Baptise M.D.   On: 11/23/2017 18:39   Ct Head Wo Contrast  Result Date: 11/22/2017 CLINICAL DATA:  Generalized weakness and left-sided arm pain for several hours, no known injury, initial encounter EXAM: CT HEAD WITHOUT CONTRAST TECHNIQUE: Contiguous axial images were obtained from the base of the skull through the vertex without intravenous contrast. COMPARISON:  MRI from 10/28/2015 FINDINGS: Brain: Mild chronic white matter ischemic changes noted. No findings to suggest acute hemorrhage, acute infarction or space-occupying mass lesion are noted. Vascular: No hyperdense vessel or unexpected calcification. Skull: Normal. Negative for fracture or focal lesion. Sinuses/Orbits: No acute finding. Other: None. IMPRESSION: Chronic white matter ischemic changes stable from previous MRI. No acute abnormality noted. Electronically Signed   By: Inez Catalina M.D.   On: 11/22/2017 16:03   Mr Brain Wo Contrast  Result Date: 11/23/2017 CLINICAL DATA:  Slurred speech for 1 week. New onset left leg weakness. EXAM: MRI HEAD WITHOUT CONTRAST TECHNIQUE: Multiplanar, multiecho pulse sequences of the brain and  surrounding structures were obtained without intravenous contrast. COMPARISON:  Head CT 11/22/2017 Brain MRI 10/28/2015 FINDINGS: BRAIN: There is no acute infarct, acute hemorrhage, hydrocephalus or extra-axial collection. The midline structures are normal. No midline shift or other mass effect. There are no old infarcts. Multifocal white matter hyperintensity, most commonly due to chronic ischemic microangiopathy. The cerebral and cerebellar volume are age-appropriate. Susceptibility-sensitive sequences show no chronic microhemorrhage or superficial siderosis. VASCULAR: Major intracranial arterial and venous sinus flow voids are normal. SKULL AND UPPER CERVICAL SPINE: Calvarial bone marrow signal is normal. There is no skull base mass. Visualized upper cervical spine and soft tissues are normal. SINUSES/ORBITS: No fluid levels or advanced mucosal thickening. No mastoid or middle ear effusion. The orbits are normal. IMPRESSION: Unchanged appearance of chronic white matter disease likely secondary to chronic ischemic microangiopathy. No acute abnormality. Electronically Signed   By: Ulyses Jarred M.D.   On: 11/23/2017 00:55   Mr Lumbar Spine Wo Contrast  Result Date: 11/23/2017 CLINICAL DATA:  Initial evaluation  for left lower extremity weakness for 1 week. EXAM: MRI LUMBAR SPINE WITHOUT CONTRAST TECHNIQUE: Multiplanar, multisequence MR imaging of the lumbar spine was performed. No intravenous contrast was administered. COMPARISON:  Prior radiograph from 11/23/2017. FINDINGS: Segmentation: Normal segmentation. Lowest well-formed disc labeled the L5-S1 level. Alignment: Mild straightening of the normal lumbar lordosis. No listhesis. Vertebrae: Vertebral body heights maintained without evidence for acute or chronic fracture. Underlying bone marrow signal intensity within normal limits. Subcentimeter benign hemangioma noted within the L5 vertebral body. No other discrete or worrisome osseous lesions. No abnormal  marrow edema. Conus medullaris and cauda equina: Conus extends to the L1-2 level. Conus and cauda equina appear normal. Paraspinal and other soft tissues: Paraspinous soft tissues within normal limits. 19 mm T2 hyperintense left renal cyst noted. Visualized visceral structures otherwise unremarkable. Disc levels: L1-2:  Unremarkable. L2-3:  Unremarkable. L3-4:  Unremarkable. L4-5: Mild annular disc bulge with disc desiccation. Superimposed shallow left extraforaminal disc protrusion with associated annular fissure, closely approximating the exiting left L4 nerve root without neural impingement or displacement (series 8, image 28). Minimal facet hypertrophy. No canal stenosis. Mild bilateral L4 foraminal narrowing. L5-S1:  Unremarkable. IMPRESSION: 1. Shallow left extraforaminal disc protrusion with associated annular fissure at L4-5, closely approximating and potentially irritating the exiting left L4 nerve root. 2. Mild bilateral L4 foraminal stenosis due to disc bulge and facet hypertrophy. 3. Otherwise unremarkable MRI of the lumbar spine. Electronically Signed   By: Jeannine Boga M.D.   On: 11/23/2017 21:48   US Carotid Bilateral  Result Date: 11/23/2017 CLINICAL DATA:  Dysarthria EXAM: BILATERAL CAROTID DUPLEX ULTRASOUND TECHNIQUE: Pearline Cables scale imaging, color Doppler and duplex ultrasound were performed of bilateral carotid and vertebral arteries in the neck. COMPARISON:  None. FINDINGS: Criteria: Quantification of carotid stenosis is based on velocity parameters that correlate the residual internal carotid diameter with NASCET-based stenosis levels, using the diameter of the distal internal carotid lumen as the denominator for stenosis measurement. The following velocity measurements were obtained: RIGHT ICA: 111 cm/sec CCA: 536 cm/sec SYSTOLIC ICA/CCA RATIO:  1.1 ECA: 116 cm/sec LEFT ICA: 92 cm/sec CCA: 468 cm/sec SYSTOLIC ICA/CCA RATIO:  0.9 ECA: 88 cm/sec RIGHT CAROTID ARTERY: Minimal soft intimal  thickening in the bulb. Low resistance internal carotid Doppler pattern. RIGHT VERTEBRAL ARTERY:  Antegrade. LEFT CAROTID ARTERY: Minimal intimal thickening in the bulb. Low resistance internal carotid Doppler pattern. LEFT VERTEBRAL ARTERY:  Antegrade. IMPRESSION: Less than 50% stenosis in the right and left internal carotid arteries. Electronically Signed   By: Marybelle Killings M.D.   On: 11/23/2017 11:47   Nm Myocar Multi W/spect W/wall Motion / Ef  Result Date: 11/23/2017  Blood pressure demonstrated a normal response to exercise.  There was no ST segment deviation noted during stress.  The study is normal.  The left ventricular ejection fraction is hyperdynamic (>65%).     Medications:  I have reviewed the patient's current medications. Scheduled: . amLODipine  5 mg Oral Daily  . aspirin EC  81 mg Oral Daily  . atorvastatin  40 mg Oral q1800  . calcium-vitamin D  1 tablet Oral Daily  . cholecalciferol  5,000 Units Oral Daily  . clopidogrel  75 mg Oral Daily  . docusate sodium  100 mg Oral BID  . enoxaparin (LOVENOX) injection  40 mg Subcutaneous Q24H  . heparin lock flush  500 Units Intravenous Once  . insulin pump   Subcutaneous TID AC, HS, 0200  . lipase/protease/amylase  108,000 Units Oral TID AC  .  lisinopril  40 mg Oral Daily  . pantoprazole  40 mg Oral Daily  . sodium chloride flush  10-40 mL Intracatheter Q12H  . sodium chloride flush  3 mL Intravenous Q12H  . vitamin E  400 Units Oral Daily    Assessment/Plan: No weakness noted today.  MRI of the lumbar spine reviewed and shows left extraforaminal disc protrusion at L4-5.  Although this may contribute to some pain doe snot explain patient's bilateral lower extremity symptoms.  Reports pain mostly in a distribution of a meralgia paresthetica.  Likely secondary to poorly controlled DM.    Recommendations: 1.  Continue Plavix 2.  Follow up with neurology where consideration for NCV/EMG to be made  No further neurologic  intervention is recommended at this time.  If further questions arise, please call or page at that time.  Thank you for allowing neurology to participate in the care of this patient.   LOS: 0 days   Alexis Goodell, MD Neurology [phone] 11/24/2017  2:20 PM

## 2017-11-24 NOTE — Plan of Care (Signed)
  Problem: Elimination: Goal: Will not experience complications related to bowel motility Outcome: Not Progressing  Dr Benjie Karvonen phoned nurse to give verbal orders for laxatives after KUB results noted. Pt initially declined laxatives.  Reports she is awaiting a test to be scheduled per Dr Donnella Sham.  Informed that nausea/vomiting could be related to large amounts of stool incolon.  Pt later willing to take suppository as ordered.

## 2017-11-24 NOTE — Consult Note (Signed)
Referring Physician:  No referring provider defined for this encounter.  Primary Physician:  Glendon Axe, MD  Chief Complaint: Leg weakness  History of Present Illness: Amber Christensen is a 52 y.o. female who presents with the chief complaint of left leg weakness pain that is been present intermittently but became intense 2 days ago.  She was evaluated by the emergency room for multiple complaints and admitted for continued care.  Neurosurgery was consulted after MRI of the lumbar spine was completed and revealed possible left L4 nerve root irritation. Patient states she has experienced significant improvement in leg symptoms since Wednesday and is now able to bear weight and ambulate.  Currently symptoms are described a weak sensation in the lateral aspect of left thigh extending into the medial aspect of left calf. Denies left lower extremity pain/numbness/tingling, right lower extremity symptoms, bladder/bowel dysfunction, saddle paresthesia, recent fall/trauma, back pain   Review of Systems:  A 10 point review of systems is negative, except for the pertinent positives and negatives detailed in the HPI.  Past Medical History: Past Medical History:  Diagnosis Date  . Allergy   . Anemia   . Anxiety   . 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 Blood transfusion without reported diagnosis   . Cervical cancer ([place])   . Clotting disorder ([place])   . Depression   . Diabetes mellitus without complication ([place])   . GERD (gastroesophageal reflux disease)   . Heart disease   . 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[place])   . Hypertension   . Liver disease   . Pancreatitis   . Thyroid disease     Past Surgical History: Past Surgical History:  Procedure Laterality Date  . ABDOMINAL HYSTERECTOMY    . APPENDECTOMY    . BLADDER SURGERY    . BOWEL RESECTION    . BREAST BIOPSY Left 1989  . CHOLECYSTECTOMY    . CYST REMOVAL HAND Left 1992   Ganglion cyst removed from Left Wrist  . DILATATION &  CURETTAGE/HYSTEROSCOPY WITH MYOSURE    . KNEE ARTHROSCOPY Left 1992  . PANCREAS SURGERY    . PORTA CATH INSERTION N/A 04/10/2017   Procedure: PORTA CATH INSERTION;  Surgeon: Algernon Huxley, MD;  Location: [facility];  Service: Cardiovascular;  Laterality: N/A;  . REPAIR OF ESOPHAGUS  2012   3 clamps placed in esophagus    Allergies: Allergies as of 11/22/2017 - Review Complete 11/22/2017  Allergen Reaction Noted  . Metoclopramide Hives 07/21/2014  . Morphine and related Anaphylaxis and Other (See Comments) 07/17/2014  . Morpholine salicylate Nausea And Vomiting, Other (See Comments), and Rash 10/28/2013  . Latex Rash 04/22/2016  . Demerol [meperidine] Hives and Other (See Comments) 07/17/2014  . Dilaudid [hydromorphone hcl] Nausea And Vomiting and Other (See Comments) 02/24/2015  . Isosorbide nitrate Other (See Comments) 02/05/2014  . Sulfa antibiotics Nausea And Vomiting 07/30/2014  . Darvon [propoxyphene] Nausea And Vomiting and Rash 02/24/2015  . Flexeril [cyclobenzaprine] Nausea And Vomiting and Rash 07/17/2014  . Levaquin [levofloxacin in d5w] Nausea And Vomiting and Rash 07/17/2014  . Nsaids Rash and Other (See Comments) 05/09/2013  . Soma [carisoprodol] Nausea And Vomiting and Rash 07/17/2014  . Zofran [ondansetron hcl] Nausea And Vomiting and Rash 07/17/2014    Medications:  Current Facility-Administered Medications:  .  acetaminophen (TYLENOL) tablet 650 mg, 650 mg, Oral, Q4H PRN, Gouru, Aruna, MD, 650 mg at 11/23/17 0134 .  amLODipine (NORVASC) tablet 5 mg, 5 mg, Oral, Daily, Gouru, Aruna, MD, 5 mg at  11/24/17 0847 .  aspirin EC tablet 81 mg, 81 mg, Oral, Daily, Gouru, Aruna, MD, 81 mg at 11/24/17 0848 .  atorvastatin (LIPITOR) tablet 40 mg, 40 mg, Oral, q1800, Gouru, Aruna, MD, 40 mg at 11/22/17 1809 .  calcium-vitamin D (OSCAL WITH D) 500-200 MG-UNIT per tablet 1 tablet, 1 tablet, Oral, Daily, Gouru, Aruna, MD, 1 tablet at 11/24/17 0848 .  cholecalciferol  (VITAMIN D) tablet 5,000 Units, 5,000 Units, Oral, Daily, Gouru, Aruna, MD, 5,000 Units at 11/24/17 0837 .  clopidogrel (PLAVIX) tablet 75 mg, 75 mg, Oral, Daily, Mody, Sital, MD, 75 mg at 11/24/17 0849 .  docusate sodium (COLACE) capsule 100 mg, 100 mg, Oral, BID, Mody, Sital, MD, 100 mg at 11/24/17 0847 .  enoxaparin (LOVENOX) injection 40 mg, 40 mg, Subcutaneous, Q24H, Gouru, Aruna, MD, 40 mg at 11/23/17 2237 .  glycerin adult suppository 1 suppository, 1 suppository, Rectal, Daily PRN, Bettey Costa, MD, 1 suppository at 11/23/17 2241 .  heparin lock flush 100 unit/mL, 500 Units, Intravenous, Once, Mody, Sital, MD .  insulin pump, , Subcutaneous, TID AC, HS, 0200, Gouru, Aruna, MD, 4.7 each at 11/24/17 1204 .  lipase/protease/amylase (CREON) capsule 108,000 Units, 108,000 Units, Oral, TID AC, Gouru, Aruna, MD, 108,000 Units at 11/24/17 1204 .  lisinopril (PRINIVIL,ZESTRIL) tablet 40 mg, 40 mg, Oral, Daily, Gouru, Aruna, MD, 40 mg at 11/24/17 0847 .  naproxen (NAPROSYN) tablet 250 mg, 250 mg, Oral, BID PRN, Gouru, Aruna, MD, 250 mg at 11/23/17 0951 .  nitroGLYCERIN (NITROSTAT) SL tablet 0.4 mg, 0.4 mg, Sublingual, Q5 Min x 3 PRN, Gouru, Aruna, MD, 0.4 mg at 11/22/17 2152 .  pantoprazole (PROTONIX) EC tablet 40 mg, 40 mg, Oral, Daily, Gouru, Aruna, MD, 40 mg at 11/24/17 0847 .  promethazine (PHENERGAN) injection 25 mg, 25 mg, Intravenous, Q6H PRN, Benjie Karvonen, Sital, MD, 25 mg at 11/23/17 1804 .  sodium chloride flush (NS) 0.9 % injection 10-40 mL, 10-40 mL, Intracatheter, Q12H, Mody, Sital, MD, 10 mL at 11/24/17 0859 .  sodium chloride flush (NS) 0.9 % injection 10-40 mL, 10-40 mL, Intracatheter, PRN, Mody, Sital, MD .  sodium chloride flush (NS) 0.9 % injection 3 mL, 3 mL, Intravenous, Q12H, Mody, Sital, MD, 3 mL at 11/24/17 0859 .  sodium phosphate (FLEET) 7-19 GM/118ML enema 1 enema, 1 enema, Rectal, Daily PRN, Bettey Costa, MD, 1 enema at 11/24/17 0902 .  vitamin E capsule 400 Units, 400 Units, Oral,  Daily, Gouru, Aruna, MD, 400 Units at 11/24/17 0848   Social History: Social History   Tobacco Use  . Smoking status: Never Smoker  . Smokeless tobacco: Never Used  Substance Use Topics  . Alcohol use: No  . Drug use: No    Family Medical History: Family History  Problem Relation Age of Onset  . Cirrhosis Mother   . Colon cancer Mother 79       TAH/BSO in ~17; deceased at 7  . Hypertension Mother   . Breast cancer Mother 43       unconfirmed  . Hypertension Father   . Prostate cancer Father 100       currently 31  . Other Father        liver disease  . Breast cancer Maternal Aunt        age at dx unk.; currently 40  . Cervical cancer Maternal Aunt   . Cancer Maternal Uncle        unk. type; currently 69s  . Stomach cancer Paternal Aunt 81  deceased at 70  . Breast cancer Maternal Grandmother 31       possibly bilateral in 66s; deceased 30  . Non-Hodgkin's lymphoma Daughter        unconfirmed; currently 70  . Cancer Maternal Aunt        hematologic malignancy; deceased 102    Physical Examination: Vitals:   11/24/17 0352 11/24/17 0843  BP: 118/82 (!) 144/92  Pulse: 84 98  Resp: 18 18  Temp: 97.8 F (36.6 C) 97.8 F (36.6 C)  SpO2: 98% 97%     General: Patient is well developed, well nourished, calm, collected, and in no apparent distress.  Psychiatric: Patient is non-anxious.  Head:  Pupils equal, round, and reactive to light.  Neck:   Supple.  Full range of motion.  Respiratory: Patient is breathing without any difficulty.  Extremities: No edema.  Vascular: Palpable pulses in dorsal pedal vessels.  Skin:   On exposed skin, there are no abnormal skin lesions.  NEUROLOGICAL:  General: In no acute distress.   Awake, alert, oriented to person, place, and time.  Pupils equal round and reactive to light.    Strength:  Side Iliopsoas Quads Hamstring PF DF EHL  R 5 5 5 5 5 5   L 5 4 5 5 5 5    Reflexes are 2+ and symmetric at the patella and  achilles.   Bilateral upper and lower extremity sensation is intact to light touch and pin prick.  Clonus is not present.  Toes are down-going.    Imaging: EXAM: MRI LUMBAR SPINE WITHOUT CONTRAST  TECHNIQUE: Multiplanar, multisequence MR imaging of the lumbar spine was performed. No intravenous contrast was administered.  COMPARISON:  Prior radiograph from 11/23/2017.  FINDINGS: Segmentation: Normal segmentation. Lowest well-formed disc labeled the L5-S1 level.  Alignment: Mild straightening of the normal lumbar lordosis. No listhesis.  Vertebrae: Vertebral body heights maintained without evidence for acute or chronic fracture. Underlying bone marrow signal intensity within normal limits. Subcentimeter benign hemangioma noted within the L5 vertebral body. No other discrete or worrisome osseous lesions. No abnormal marrow edema.  Conus medullaris and cauda equina: Conus extends to the L1-2 level. Conus and cauda equina appear normal.  Paraspinal and other soft tissues: Paraspinous soft tissues within normal limits. 19 mm T2 hyperintense left renal cyst noted. Visualized visceral structures otherwise unremarkable.  Disc levels:  L1-2:  Unremarkable.  L2-3:  Unremarkable.  L3-4:  Unremarkable.  L4-5: Mild annular disc bulge with disc desiccation. Superimposed shallow left extraforaminal disc protrusion with associated annular fissure, closely approximating the exiting left L4 nerve root without neural impingement or displacement (series 8, image 28). Minimal facet hypertrophy. No canal stenosis. Mild bilateral L4 foraminal narrowing.  L5-S1:  Unremarkable.  IMPRESSION: 1. Shallow left extraforaminal disc protrusion with associated annular fissure at L4-5, closely approximating and potentially irritating the exiting left L4 nerve root. 2. Mild bilateral L4 foraminal stenosis due to disc bulge and facet hypertrophy. 3. Otherwise unremarkable MRI of the  lumbar spine.   Electronically Signed   By: Jeannine Boga M.D.   On: 11/23/2017 21:48   Assessment and Plan: Ms. Kohn is a pleasant 52 y.o. female with left L4 lumbar radiculopathy.  Consulting doctor discussed case with Dr. Cari Caraway.  Recommended ESI and pain control with follow-up in clinic in approximately 1 week. Contacted radiology to determine if provider was available to provide ESI.  Although it would have been possible to receive ESI today patient was on blood thinners that would need  to be discontinued before procedure could be performed. Since her pain is resolving the plan is to discharge patient in complete ESI as outpatient at a later date if needed. She may follow-up in our clinic with Dr. Cari Caraway in 1 week.  Marin Olp, PA-C Dept. of Neurosurgery

## 2017-11-24 NOTE — Progress Notes (Signed)
Discharged to home with her husband.  Prescriptions provided.  She will make follow up appointments due to insurance restrictions.  She will have an epidural steroid injection as an outpatient.  Information about this procedure was provided.

## 2017-12-14 DIAGNOSIS — Z8673 Personal history of transient ischemic attack (TIA), and cerebral infarction without residual deficits: Secondary | ICD-10-CM | POA: Insufficient documentation

## 2017-12-14 DIAGNOSIS — E538 Deficiency of other specified B group vitamins: Secondary | ICD-10-CM | POA: Insufficient documentation

## 2018-01-14 DIAGNOSIS — I209 Angina pectoris, unspecified: Secondary | ICD-10-CM

## 2018-01-14 HISTORY — DX: Angina pectoris, unspecified: I20.9

## 2018-01-16 ENCOUNTER — Other Ambulatory Visit: Payer: Self-pay | Admitting: Gastroenterology

## 2018-01-16 DIAGNOSIS — R300 Dysuria: Secondary | ICD-10-CM

## 2018-01-16 DIAGNOSIS — R109 Unspecified abdominal pain: Secondary | ICD-10-CM

## 2018-01-25 ENCOUNTER — Ambulatory Visit
Admission: RE | Admit: 2018-01-25 | Discharge: 2018-01-25 | Disposition: A | Discharge status: Home / Self Care | Payer: Medicare Other | Source: Ambulatory Visit | Attending: Gastroenterology | Admitting: Gastroenterology

## 2018-01-25 DIAGNOSIS — R109 Unspecified abdominal pain: Secondary | ICD-10-CM | POA: Diagnosis present

## 2018-01-25 DIAGNOSIS — R300 Dysuria: Secondary | ICD-10-CM | POA: Insufficient documentation

## 2018-01-25 LAB — POCT I-STAT CREATININE: Creatinine, Ser: 0.8 mg/dL (ref 0.44–1.00)

## 2018-01-25 MED ORDER — IOPAMIDOL (ISOVUE-300) INJECTION 61%
100.0000 mL | Freq: Once | INTRAVENOUS | Status: AC | PRN
  Administered 2018-01-25: 100 mL via INTRAVENOUS

## 2018-01-31 ENCOUNTER — Encounter: Payer: Self-pay | Admitting: *Deleted

## 2018-02-01 ENCOUNTER — Encounter: Payer: Self-pay | Admitting: *Deleted

## 2018-02-01 ENCOUNTER — Ambulatory Visit: Payer: Medicare Other | Admitting: Anesthesiology

## 2018-02-01 ENCOUNTER — Ambulatory Visit
Admission: RE | Admit: 2018-02-01 | Discharge: 2018-02-01 | Disposition: A | Discharge status: Home / Self Care | Payer: Medicare Other | Attending: Gastroenterology | Admitting: Gastroenterology

## 2018-02-01 ENCOUNTER — Encounter
Admission: RE | Disposition: A | Discharge status: Home / Self Care | Payer: Self-pay | Source: Home / Self Care | Attending: Gastroenterology

## 2018-02-01 DIAGNOSIS — K861 Other chronic pancreatitis: Secondary | ICD-10-CM | POA: Diagnosis not present

## 2018-02-01 DIAGNOSIS — K439 Ventral hernia without obstruction or gangrene: Secondary | ICD-10-CM | POA: Diagnosis not present

## 2018-02-01 DIAGNOSIS — Z8541 Personal history of malignant neoplasm of cervix uteri: Secondary | ICD-10-CM | POA: Insufficient documentation

## 2018-02-01 DIAGNOSIS — F319 Bipolar disorder, unspecified: Secondary | ICD-10-CM | POA: Insufficient documentation

## 2018-02-01 DIAGNOSIS — I10 Essential (primary) hypertension: Secondary | ICD-10-CM | POA: Diagnosis not present

## 2018-02-01 DIAGNOSIS — K429 Umbilical hernia without obstruction or gangrene: Secondary | ICD-10-CM | POA: Diagnosis present

## 2018-02-01 DIAGNOSIS — Z886 Allergy status to analgesic agent status: Secondary | ICD-10-CM | POA: Insufficient documentation

## 2018-02-01 DIAGNOSIS — Z881 Allergy status to other antibiotic agents status: Secondary | ICD-10-CM | POA: Insufficient documentation

## 2018-02-01 DIAGNOSIS — Z79899 Other long term (current) drug therapy: Secondary | ICD-10-CM | POA: Insufficient documentation

## 2018-02-01 DIAGNOSIS — Z888 Allergy status to other drugs, medicaments and biological substances status: Secondary | ICD-10-CM | POA: Insufficient documentation

## 2018-02-01 DIAGNOSIS — E119 Type 2 diabetes mellitus without complications: Secondary | ICD-10-CM | POA: Insufficient documentation

## 2018-02-01 DIAGNOSIS — Z885 Allergy status to narcotic agent status: Secondary | ICD-10-CM | POA: Diagnosis not present

## 2018-02-01 DIAGNOSIS — F419 Anxiety disorder, unspecified: Secondary | ICD-10-CM | POA: Insufficient documentation

## 2018-02-01 DIAGNOSIS — K7581 Nonalcoholic steatohepatitis (NASH): Secondary | ICD-10-CM | POA: Insufficient documentation

## 2018-02-01 DIAGNOSIS — E271 Primary adrenocortical insufficiency: Secondary | ICD-10-CM | POA: Diagnosis not present

## 2018-02-01 DIAGNOSIS — K7460 Unspecified cirrhosis of liver: Secondary | ICD-10-CM | POA: Insufficient documentation

## 2018-02-01 DIAGNOSIS — E079 Disorder of thyroid, unspecified: Secondary | ICD-10-CM | POA: Insufficient documentation

## 2018-02-01 DIAGNOSIS — K219 Gastro-esophageal reflux disease without esophagitis: Secondary | ICD-10-CM | POA: Diagnosis not present

## 2018-02-01 DIAGNOSIS — Z882 Allergy status to sulfonamides status: Secondary | ICD-10-CM | POA: Insufficient documentation

## 2018-02-01 HISTORY — DX: Headache: R51

## 2018-02-01 HISTORY — DX: Unspecified lump in unspecified breast: N63.0

## 2018-02-01 HISTORY — DX: Carpal tunnel syndrome, unspecified upper limb: G56.00

## 2018-02-01 HISTORY — DX: Esophagitis, unspecified: K20.9

## 2018-02-01 HISTORY — DX: Nonalcoholic steatohepatitis (NASH): K75.81

## 2018-02-01 HISTORY — DX: Irritable bowel syndrome, unspecified: K58.9

## 2018-02-01 HISTORY — DX: Headache, unspecified: R51.9

## 2018-02-01 HISTORY — DX: Esophagitis, unspecified without bleeding: K20.90

## 2018-02-01 HISTORY — DX: Bipolar disorder, unspecified: F31.9

## 2018-02-01 HISTORY — DX: Other chronic pancreatitis: K86.1

## 2018-02-01 HISTORY — DX: Other specified disorders of kidney and ureter: N28.89

## 2018-02-01 HISTORY — DX: Other specified disorders of bladder: N32.89

## 2018-02-01 HISTORY — DX: Unspecified cirrhosis of liver: K74.60

## 2018-02-01 LAB — GLUCOSE, CAPILLARY: Glucose-Capillary: 111 mg/dL — ABNORMAL HIGH (ref 70–99)

## 2018-02-01 SURGERY — COLONOSCOPY WITH PROPOFOL
Anesthesia: General

## 2018-02-01 MED ORDER — SODIUM CHLORIDE 0.9 % IV SOLN: INTRAVENOUS | Status: DC

## 2018-02-01 MED ORDER — PROPOFOL 500 MG/50ML IV EMUL
INTRAVENOUS | Status: AC
  Filled 2018-02-01: qty 50

## 2018-02-01 MED ORDER — GLYCOPYRROLATE 0.2 MG/ML IJ SOLN
INTRAMUSCULAR | Status: AC
  Filled 2018-02-01: qty 1

## 2018-02-01 MED ORDER — LIDOCAINE HCL (PF) 2 % IJ SOLN
INTRAMUSCULAR | Status: AC
  Filled 2018-02-01: qty 10

## 2018-02-01 MED ORDER — PROPOFOL 10 MG/ML IV BOLUS
INTRAVENOUS | Status: AC
  Filled 2018-02-01: qty 20

## 2018-02-01 MED ORDER — SUCCINYLCHOLINE CHLORIDE 20 MG/ML IJ SOLN
INTRAMUSCULAR | Status: AC
  Filled 2018-02-01: qty 1

## 2018-02-01 MED ORDER — PHENYLEPHRINE HCL 10 MG/ML IJ SOLN
INTRAMUSCULAR | Status: AC
  Filled 2018-02-01: qty 1

## 2018-02-01 NOTE — H&P (Signed)
Ms. Amber Christensen is a 52 year old female presenting today for colonoscopy.  She has had a history of change in bowel habits with both constipation and diarrhea/variable bowel habits.  She is also been having some abdominal pain initially left sided abdominal pain.  She had a CT scan on 01/25/2018 showing diffuse stool throughout the colon likely indicative of a degree of chronic constipation per report.  There is no evidence of bowel obstruction.  However she also showed a small midline ventral hernia slightly superior to the umbilicus containing a portion of the loop of transverse colon.  There is no bowel compromise noted on the CT.  There is also a small umbilical hernia containing fat but no bowel.  On evaluation this morning she did tolerate her bowel prep poorly with nausea and emesis however she is also having some tenderness at the site of her ventral hernia.  I am reticent to proceed with the colonoscopy today due to this change and I have consulted general surgery for opinion in regards to this issue.  There are no masses or rebound noted.  Surgery has kindly consented to see her today while she is in the hospital to ascertain possibility of further management.

## 2018-02-01 NOTE — Anesthesia Preprocedure Evaluation (Addendum)
Anesthesia Evaluation  Patient identified by MRN, date of birth, ID band Patient awake    Reviewed: Allergy & Precautions, H&P , NPO status , Patient's Chart, lab work & pertinent test results  History of Anesthesia Complications Negative for: history of anesthetic complications  Airway Mallampati: III  TM Distance: >3 FB Neck ROM: limited    Dental  (+) Chipped, Poor Dentition   Pulmonary neg shortness of breath, COPD,           Cardiovascular Exercise Tolerance: Good hypertension, (-) angina(-) Past MI      Neuro/Psych  Headaches, PSYCHIATRIC DISORDERS TIA Neuromuscular disease    GI/Hepatic GERD  Medicated and Controlled,(+) Hepatitis -  Endo/Other  diabetes, Type 2, Insulin DependentPatient with insulin pump, she reports that she does not know how to use the pump, plan to have patient remove the pump for the procedure   Renal/GU Renal disease  negative genitourinary   Musculoskeletal   Abdominal   Peds  Hematology negative hematology ROS (+)   Anesthesia Other Findings Past Medical History: No date: Allergy No date: Anemia No date: Anxiety No date: 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[place]) No date: Bipolar disorder ([place]) No date: Bladder mass No date: Blood transfusion without reported diagnosis No date: Breast mass No date: Carpal tunnel syndrome No date: Cervical cancer ([place]) No date: Chronic pancreatitis ([facility]) No date: Cirrhosis ([place]) No date: Clotting disorder ([place]) No date: Depression No date: Diabetes mellitus without complication (H[facility]) No date: Esophagitis No date: GERD (gastroesophageal reflux disease) No date: Headache No date: Heart disease No date: 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[place]) No date: Hypertension No date: Irritable bowel syndrome No date: Kidney mass No date: Liver disease No date: NASH (nonalcoholic steatohepatitis) No date: Pancreatitis No date: Thyroid disease  Past Surgical History: No date:  ABDOMINAL HYSTERECTOMY No date: APPENDECTOMY No date: BLADDER SURGERY No date: BOWEL RESECTION 1989: BREAST BIOPSY; Left No date: CARDIAC CATHETERIZATION No date: CHOLECYSTECTOMY 1992: CYST REMOVAL HAND; Left     Comment:  Ganglion cyst removed from Left Wrist No date: CYSTECTOMY No date: DILATATION & CURETTAGE/HYSTEROSCOPY WITH MYOSURE No date: ERCP No date: ESOPHAGOGASTRODUODENOSCOPY 1992: KNEE ARTHROSCOPY; Left No date: LIVER BIOPSY No date: PANCREAS SURGERY 04/10/2017: PORTA CATH INSERTION; N/A     Comment:  Procedure: PORTA CATH INSERTION;  Surgeon: Algernon Huxley,              MD;  Location: [facility];  Service:               Cardiovascular;  Laterality: N/A; 2012: REPAIR OF ESOPHAGUS     Comment:  3 clamps placed in esophagus No date: SMALL BOWEL REPAIR No date: TONSILLECTOMY     Reproductive/Obstetrics negative OB ROS                            Anesthesia Physical Anesthesia Plan  ASA: III  Anesthesia Plan: General   Post-op Pain Management:    Induction: Intravenous  PONV Risk Score and Plan: Dexamethasone, Ondansetron, Midazolam and Treatment may vary due to age or medical condition  Airway Management Planned: Natural Airway and Nasal Cannula  Additional Equipment:   Intra-op Plan:   Post-operative Plan:   Informed Consent: I have reviewed the patients History and Physical, chart, labs and discussed the procedure including the risks, benefits and alternatives for the proposed anesthesia with the patient or authorized representative who has indicated his/her understanding and acceptance.   Dental Advisory Given  Plan  Discussed with: Anesthesiologist, CRNA and Surgeon  Anesthesia Plan Comments: (Patient consented for risks of anesthesia including but not limited to:  - adverse reactions to medications - risk of intubation if required - damage to teeth, lips or other oral mucosa - sore throat or hoarseness - Damage to  heart, brain, lungs or loss of life  Patient voiced understanding.)        Anesthesia Quick Evaluation

## 2018-02-01 NOTE — OR Nursing (Signed)
Procedure cancelled due to need for surgical consult for increasing pain related to abdominal hernia.

## 2018-02-01 NOTE — Consult Note (Signed)
[facility] SURGICAL CONSULTATION NOTE    HISTORY OF PRESENT ILLNESS (HPI):  52 y.o. female presented to [facility] today for scheduled colonoscopy with Dr Gustavo Lah. A CT was obtained on 12/12 which showed a fat containing umbilical hernia and a midline ventral hernia that contained loops of large intestine without evidence of bowel compromise. The patient reports that she has had about 1 year of dull aching abdominal pain near and above her umbilicus. She also endorse that she can intermittently "feel a bulge in that area that comes and goes." This bulge has never been stuck to her knowledge. She endorse a history of chronic nausea and diarrhea. She was able to tolerate her bowel prep for colonoscopy. No additional complaints of fevers, chills, chest pain, SOB, or urinary changes. She has an extensive abdominal surgical history.   Surgery is consulted by gastroenterology physician Dr. Gustavo Lah in this context for evaluation and management of her umbilical and ventral hernias.   PAST MEDICAL HISTORY (PMH):  Past Medical History:  Diagnosis Date  . Allergy   . Anemia   . Anxiety   . 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 Bipolar disorder ([place])   . Bladder mass   . Blood transfusion without reported diagnosis   . Breast mass   . Carpal tunnel syndrome   . Cervical cancer ([place])   . Chronic pancreatitis ([place])   . Cirrhosis ([place])   . Clotting disorder ([place])   . Depression   . Diabetes mellitus without complication ([place])   . Esophagitis   . GERD (gastroesophageal reflux disease)   . Headache   . Heart disease   . 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 Hypertension   . Irritable bowel syndrome   . Kidney mass   . Liver disease   . NASH (nonalcoholic steatohepatitis)   . Pancreatitis   . Thyroid disease      PAST SURGICAL HISTORY ([place]):  Past Surgical History:  Procedure Laterality Date  . ABDOMINAL HYSTERECTOMY    . APPENDECTOMY    . BLADDER SURGERY    . BOWEL RESECTION    . BREAST  BIOPSY Left 1989  . CARDIAC CATHETERIZATION    . CHOLECYSTECTOMY    . CYST REMOVAL HAND Left 1992   Ganglion cyst removed from Left Wrist  . CYSTECTOMY    . DILATATION & CURETTAGE/HYSTEROSCOPY WITH MYOSURE    . ERCP    . ESOPHAGOGASTRODUODENOSCOPY    . KNEE ARTHROSCOPY Left 1992  . LIVER BIOPSY    . PANCREAS SURGERY    . PORTA CATH INSERTION N/A 04/10/2017   Procedure: PORTA CATH INSERTION;  Surgeon: Algernon Huxley, MD;  Location: [facility];  Service: Cardiovascular;  Laterality: N/A;  . REPAIR OF ESOPHAGUS  2012   3 clamps placed in esophagus  . SMALL BOWEL REPAIR    . TONSILLECTOMY       MEDICATIONS:  Prior to Admission medications   Medication Sig Start Date End Date Taking? Authorizing Provider  ACIDOPHILUS LACTOBACILLUS PO Take by mouth daily.   Yes [provider]  cyanocobalamin 2000 MCG tablet Take 2,000 mcg by mouth daily.   Yes [provider]  docusate sodium (COLACE) 100 MG capsule Take 100 mg by mouth 2 (two) times daily.   Yes [provider]  lubiprostone (AMITIZA) 24 MCG capsule Take 24 mcg by mouth 2 (two) times daily with a meal.   Yes [provider]  magnesium oxide (MAG-OX) 400 MG tablet Take 400 mg  by mouth daily.   Yes [provider]  nystatin cream (MYCOSTATIN) Apply 1 application topically 2 (two) times daily.   Yes [provider]  Potassium Gluconate 595 MG CAPS Take by mouth as needed.   Yes [provider]  promethazine (PHENERGAN) 12.5 MG tablet Take 12.5 mg by mouth every 6 (six) hours as needed for nausea or vomiting.   Yes [provider]  amLODipine (NORVASC) 5 MG tablet Take 5 mg by mouth daily.    [provider]  Calcium Carb-Cholecalciferol (CALCIUM 600/VITAMIN D3 PO) Take 1 tablet by mouth daily.    [provider]  Cholecalciferol (VITAMIN D-3) 5000 units TABS Take 5,000 Units by mouth daily.    [provider]  clopidogrel (PLAVIX) 75  MG tablet Take 1 tablet (75 mg total) by mouth daily. 11/25/17   Bettey Costa, MD  Dexlansoprazole 30 MG capsule Take 30 mg by mouth daily.    [provider]  hydrochlorothiazide (HYDRODIURIL) 12.5 MG tablet Take 12.5 mg by mouth daily.  05/22/17   [provider]  lipase/protease/amylase (CREON) 36000 UNITS CPEP capsule Take 108,000 Units by mouth 3 (three) times daily before meals.    [provider]  lisinopril (PRINIVIL,ZESTRIL) 40 MG tablet Take 40 mg by mouth daily.    [provider]  naproxen sodium (ALEVE) 220 MG tablet Take 220-440 mg by mouth 2 (two) times daily as needed (pain).    [provider]  NOVOLOG 100 UNIT/ML injection Inject 40 Units into the skin See admin instructions. Up to 40u via insulin pump.    [provider]  vitamin E 400 UNIT capsule Take 400 Units by mouth daily.    [provider]     ALLERGIES:  Allergies  Allergen Reactions  . Metoclopramide Hives  . Morphine And Related Anaphylaxis and Other (See Comments)    Pt states that this medication causes cardiac arrest.    . Morpholine Salicylate Nausea And Vomiting, Other (See Comments) and Rash    CHF  . Latex Rash  . Demerol [Meperidine] Hives and Other (See Comments)    Pt states that this medication causes cardiac arrest.    . Dilaudid [Hydromorphone Hcl] Nausea And Vomiting and Other (See Comments)    Pt states that this medication causes cardiac arrest.    . Isosorbide Nitrate Other (See Comments)    Other reaction(s): Headache   . Sulfa Antibiotics Nausea And Vomiting  . Darvon [Propoxyphene] Nausea And Vomiting and Rash  . Flexeril [Cyclobenzaprine] Nausea And Vomiting and Rash  . Levaquin [Levofloxacin In D5w] Nausea And Vomiting and Rash  . Nsaids Rash and Other (See Comments)    Naproxen causes a rash; patient is able to tolerate Advil   . Soma [Carisoprodol] Nausea And Vomiting and Rash  . Zofran [Ondansetron Hcl] Nausea And  Vomiting and Rash     SOCIAL HISTORY:  Social History   Socioeconomic History  . Marital status: Married    Spouse name: Not on file  . Number of children: Not on file  . Years of education: Not on file  . Highest education level: Not on file  Occupational History  . Not on file  Social Needs  . Financial resource strain: Not on file  . Food insecurity:    Worry: Not on file    Inability: Not on file  . Transportation needs:    Medical: Not on file    Non-medical: Not on file  Tobacco Use  .  Smoking status: Never Smoker  . Smokeless tobacco: Never Used  Substance and Sexual Activity  . Alcohol use: No  . Drug use: No  . Sexual activity: Yes    Birth control/protection: Surgical  Lifestyle  . Physical activity:    Days per week: Not on file    Minutes per session: Not on file  . Stress: Not on file  Relationships  . Social connections:    Talks on phone: Not on file    Gets together: Not on file    Attends religious service: Not on file    Active member of club or organization: Not on file    Attends meetings of clubs or organizations: Not on file    Relationship status: Not on file  . Intimate partner violence:    Fear of current or ex partner: Not on file    Emotionally abused: Not on file    Physically abused: Not on file    Forced sexual activity: Not on file  Other Topics Concern  . Not on file  Social History Narrative   Working, independent at baseline     FAMILY HISTORY:  Family History  Problem Relation Age of Onset  . Cirrhosis Mother   . Colon cancer Mother 28       TAH/BSO in ~7; deceased at 33  . Hypertension Mother   . Breast cancer Mother 9       unconfirmed  . Hypertension Father   . Prostate cancer Father 65       currently 49  . Other Father        liver disease  . Breast cancer Maternal Aunt        age at dx unk.; currently 24  . Cervical cancer Maternal Aunt   . Cancer Maternal Uncle        unk. type; currently 31s  .  Stomach cancer Paternal Aunt 47       deceased at 56  . Breast cancer Maternal Grandmother 51       possibly bilateral in 30s; deceased 4  . Non-Hodgkin's lymphoma Daughter        unconfirmed; currently 20  . Cancer Maternal Aunt        hematologic malignancy; deceased 6      REVIEW OF SYSTEMS:  Review of Systems  Constitutional: Negative for chills, fever and weight loss.  Respiratory: Negative for cough and shortness of breath.   Cardiovascular: Negative for chest pain and palpitations.  Gastrointestinal: Positive for abdominal pain, diarrhea and nausea. Negative for blood in stool, constipation and vomiting.  Genitourinary: Negative for dysuria and urgency.  Neurological: Negative for dizziness and headaches.  All other systems reviewed and are negative.   VITAL SIGNS:  Temp:  [97 F (36.1 C)] 97 F (36.1 C) (12/19 0723) Pulse Rate:  [92] 92 (12/19 0723) Resp:  [20] 20 (12/19 0723) BP: (142)/(97) 142/97 (12/19 0723) SpO2:  [100 %] 100 % (12/19 0723) Weight:  [66.7 kg] 66.7 kg (12/19 0723)     Height: 5' 4"  (162.6 cm) Weight: 66.7 kg BMI (Calculated): 25.22   INTAKE/OUTPUT:  This shift: No intake/output data recorded.  Last 2 shifts: @IOLAST2SHIFTS @   PHYSICAL EXAM:  Physical Exam Vitals signs and nursing note reviewed.  Constitutional:      General: She is not in acute distress.    Appearance: Normal appearance. She is not ill-appearing.  HENT:     Head: Normocephalic and atraumatic.  Eyes:  Extraocular Movements: Extraocular movements intact.     Conjunctiva/sclera: Conjunctivae normal.  Cardiovascular:     Rate and Rhythm: Normal rate and regular rhythm.     Heart sounds: No murmur. No friction rub. No gallop.   Pulmonary:     Effort: Pulmonary effort is normal.     Breath sounds: Normal breath sounds. No wheezing or rhonchi.  Abdominal:     General: Abdomen is flat.     Tenderness: There is abdominal tenderness. There is no guarding or rebound.      Hernia: A hernia is present.     Comments: Reducible umbilical hernia appreciable, no evidence of incarceration No appreciable ventral hernia Multiple previous abdominal scars including laparoscopic incisions and midline laparotomy incision No evidence of peritonitis  Genitourinary:    Comments: Deferred Skin:    General: Skin is warm and dry.     Coloration: Skin is not jaundiced.  Neurological:     General: No focal deficit present.     Mental Status: She is alert and oriented to person, place, and time.  Psychiatric:        Mood and Affect: Mood normal.        Behavior: Behavior normal.       Labs:  CBC Latest Ref Rng & Units 11/23/2017 11/22/2017 08/06/2017  WBC 4.0 - 10.5 K/uL 5.0 5.8 7.7  Hemoglobin 12.0 - 15.0 g/dL 11.6(L) 12.0 14.1  Hematocrit 36.0 - 46.0 % 32.1(L) 33.2(L) 39.1  Platelets 150 - 400 K/uL 150 153 195   CMP Latest Ref Rng & Units 01/25/2018 11/23/2017 11/22/2017  Glucose 70 - 99 mg/dL - 108(H) 125(H)  BUN 6 - 20 mg/dL - 15 12  Creatinine 0.44 - 1.00 mg/dL 0.80 0.59 0.65  Sodium 135 - 145 mmol/L - 143 142  Potassium 3.5 - 5.1 mmol/L - 3.3(L) 3.2(L)  Chloride 98 - 111 mmol/L - 110 105  CO2 22 - 32 mmol/L - 28 26  Calcium 8.9 - 10.3 mg/dL - 8.1(L) 8.2(L)  Total Protein 6.5 - 8.1 g/dL - - 6.6  Total Bilirubin 0.3 - 1.2 mg/dL - - 3.8(H)  Alkaline Phos 38 - 126 U/L - - 124  AST 15 - 41 U/L - - 29  ALT 0 - 44 U/L - - 43     Imaging studies:   -- CT Abdomen/Pelvis on 01/25/18 IMPRESSION: 1. Diffuse stool throughout the colon, likely indicative of a degree of chronic constipation. No evident bowel obstruction. 2. Small midline ventral hernia slightly superior to the umbilicus containing a portion of a loop of transverse colon. No bowel compromise. Nearby small umbilical hernia containing fat but no bowel. 3. No abscess in the abdomen or pelvis. Appendix absent. No periappendiceal region inflammation. 4.  No renal or ureteral calculus.  No  hydronephrosis. 5.  Gallbladder absent.  Uterus absent.  Most of pancreas absent. 6.  Hepatic steatosis.   Assessment/Plan:  52 y.o. female with a reducible umbilical and ventral hernia without evidence of incarceration or peritonitis, complicated by pertinent comorbidities including anxiety, HTN, DM, Addison's Disease, history of pancreatitis, and an extensive abdominal surgical history.   - Discussed case with Dr Dahlia Byes and Dr Gustavo Lah, and agree with forgoing colonoscopy today   - No evidence of peritonitis or incarceration, no indication for emergent surgical management  - Will arrange for follow up with Dr Dahlia Byes as an outpatient either in 1 week or in 3 weeks for discussion of management.     All of the above  findings and recommendations were discussed with the patient and her family, and all of patient's and her family's questions were answered to their expressed satisfaction.  Thank you for the opportunity to participate in this patient's care.   -- Edison Simon, PA-C [facility] 02/01/2018, 9:57 AM [phone] M-F: 7am - 4pm

## 2018-02-05 ENCOUNTER — Ambulatory Visit: Payer: Medicare Other | Admitting: Surgery

## 2018-02-16 ENCOUNTER — Emergency Department
Admission: EM | Admit: 2018-02-16 | Discharge: 2018-02-17 | Disposition: A | Discharge status: Home / Self Care | Payer: Medicare HMO | Attending: Emergency Medicine | Admitting: Emergency Medicine

## 2018-02-16 ENCOUNTER — Encounter: Payer: Self-pay | Admitting: Emergency Medicine

## 2018-02-16 ENCOUNTER — Other Ambulatory Visit: Payer: Self-pay

## 2018-02-16 ENCOUNTER — Emergency Department: Payer: Medicare HMO

## 2018-02-16 DIAGNOSIS — E079 Disorder of thyroid, unspecified: Secondary | ICD-10-CM | POA: Diagnosis not present

## 2018-02-16 DIAGNOSIS — R000 Tachycardia, unspecified: Secondary | ICD-10-CM | POA: Diagnosis not present

## 2018-02-16 DIAGNOSIS — I10 Essential (primary) hypertension: Secondary | ICD-10-CM | POA: Diagnosis not present

## 2018-02-16 DIAGNOSIS — R079 Chest pain, unspecified: Secondary | ICD-10-CM | POA: Diagnosis not present

## 2018-02-16 DIAGNOSIS — E119 Type 2 diabetes mellitus without complications: Secondary | ICD-10-CM | POA: Insufficient documentation

## 2018-02-16 DIAGNOSIS — Z79899 Other long term (current) drug therapy: Secondary | ICD-10-CM | POA: Insufficient documentation

## 2018-02-16 DIAGNOSIS — R0789 Other chest pain: Secondary | ICD-10-CM | POA: Diagnosis not present

## 2018-02-16 DIAGNOSIS — Z794 Long term (current) use of insulin: Secondary | ICD-10-CM | POA: Insufficient documentation

## 2018-02-16 LAB — BASIC METABOLIC PANEL
Anion gap: 11 (ref 5–15)
BUN: 10 mg/dL (ref 6–20)
CO2: 27 mmol/L (ref 22–32)
Calcium: 8.3 mg/dL — ABNORMAL LOW (ref 8.9–10.3)
Chloride: 101 mmol/L (ref 98–111)
Creatinine, Ser: 0.58 mg/dL (ref 0.44–1.00)
GFR calc Af Amer: >60 mL/min (ref >60)
GFR calc non Af Amer: >60 mL/min (ref >60)
Glucose, Bld: 187 mg/dL — ABNORMAL HIGH (ref 70–99)
Potassium: 3.1 mmol/L — ABNORMAL LOW (ref 3.5–5.1)
Sodium: 139 mmol/L (ref 135–145)

## 2018-02-16 LAB — CBC
HCT: 38.1 % (ref 36.0–46.0)
Hemoglobin: 13.4 g/dL (ref 12.0–15.0)
MCH: 30.6 pg (ref 26.0–34.0)
MCHC: 35.2 g/dL (ref 30.0–36.0)
MCV: 87 fL (ref 80.0–100.0)
Platelets: 188 10*3/uL (ref 150–400)
RBC: 4.38 MIL/uL (ref 3.87–5.11)
RDW: 11.9 % (ref 11.5–15.5)
WBC: 8.3 10*3/uL (ref 4.0–10.5)
nRBC: 0 % (ref 0.0–0.2)

## 2018-02-16 LAB — PROTIME-INR
INR: 0.95
Prothrombin Time: 12.6 seconds (ref 11.4–15.2)

## 2018-02-16 LAB — FIBRIN DERIVATIVES D-DIMER (ARMC ONLY): Fibrin derivatives D-dimer (ARMC): 297.7 ng/mL (FEU) (ref 0.00–499.00)

## 2018-02-16 LAB — TROPONIN I: Troponin I: <0.03 ng/mL (ref <0.03)

## 2018-02-16 MED ORDER — HYDROCODONE-ACETAMINOPHEN 5-325 MG PO TABS
2.0000 | ORAL_TABLET | Freq: Once | ORAL | Status: DC
  Filled 2018-02-16 (×2): qty 2

## 2018-02-16 MED ORDER — ASPIRIN 81 MG PO CHEW
324.0000 mg | CHEWABLE_TABLET | Freq: Once | ORAL | Status: AC
  Administered 2018-02-16: 324 mg via ORAL
  Filled 2018-02-16: qty 4

## 2018-02-16 NOTE — ED Provider Notes (Signed)
[facility] Emergency Department Provider Note   ____________________________________________   First MD Initiated Contact with Patient 02/16/18 2114     (approximate)  I have reviewed the triage vital signs and the nursing notes.   HISTORY  Chief Complaint Chest Pain    HPI Amber Christensen is a 53 y.o. female here for evaluation of chest pain.  Patient has a history of chronic pancreatitis, cirrhosis and diabetes with an insulin pump  Patient reports that she woke up this morning and noticed she is very achy and sore across her left upper chest.  She reports a sharp discomfort with movement of the left arm, coughing and inspiration.  No fevers or chills.  No productive cough and is not felt like she is developed any type of an illness.  She reports she had similar symptoms a few months ago around October and had a stress test.  She came in for evaluation tonight to make sure it is not heart related.  Reports her blood sugars have been well controlled recently.  She denies history of coronary disease.  She does have diabetes and hypertension.  She also reports she had a stress test a few months ago and was told it was fine.  Pain radiates slightly towards the muscles of the left upper shoulder and back region.  No radiation to the arm.  No nausea vomiting.  No chest pressure.  The pain is primarily sharp worsened with movement and pressing in her upper chest.  She does not think she has done anything to harm or injure herself  No associated abdominal pain.  She does have abdominal crampiness and discomfort chronically and reports that has not changed.  No nausea or vomiting.  No diarrhea.  No history of blood clots.  No leg swelling.  No recent surgeries or hospitalizations.    Past Medical History:  Diagnosis Date  . Allergy   . Anemia   . Anxiety   . 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[place])   . Bipolar disorder ([place])   . Bladder mass   . Blood transfusion  without reported diagnosis   . Breast mass   . Carpal tunnel syndrome   . Cervical cancer ([place])   . Chronic pancreatitis ([place])   . Cirrhosis ([place])   . Clotting disorder ([place])   . Depression   . Diabetes mellitus without complication ([place])   . Esophagitis   . GERD (gastroesophageal reflux disease)   . Headache   . Heart disease   . 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[place])   . Hypertension   . Irritable bowel syndrome   . Kidney mass   . Liver disease   . NASH (nonalcoholic steatohepatitis)   . Pancreatitis   . Thyroid disease     Patient Active Problem List   Diagnosis Date Noted  . Hypertension 03/21/2017  . Diabetes mellitus without complication ([place]) 96/75/9163  . Pancreatitis 03/21/2017  . Poor venous access 03/21/2017  . Vasculitis ([place]) 08/03/2016  . Acute maculopapular rash 04/21/2016  . Maculopapular rash, generalized 04/20/2016  . Unstable angina ([place]) 09/28/2015    Past Surgical History:  Procedure Laterality Date  . ABDOMINAL HYSTERECTOMY    . APPENDECTOMY    . BLADDER SURGERY    . BOWEL RESECTION    . BREAST BIOPSY Left 1989  . CARDIAC CATHETERIZATION    . CHOLECYSTECTOMY    . CYST REMOVAL HAND Left 1992   Ganglion cyst removed from Left Wrist  . CYSTECTOMY    . DILATATION &  CURETTAGE/HYSTEROSCOPY WITH MYOSURE    . ERCP    . ESOPHAGOGASTRODUODENOSCOPY    . KNEE ARTHROSCOPY Left 1992  . LIVER BIOPSY    . PANCREAS SURGERY    . PORTA CATH INSERTION N/A 04/10/2017   Procedure: PORTA CATH INSERTION;  Surgeon: Algernon Huxley, MD;  Location: [facility];  Service: Cardiovascular;  Laterality: N/A;  . REPAIR OF ESOPHAGUS  2012   3 clamps placed in esophagus  . SMALL BOWEL REPAIR    . TONSILLECTOMY      Prior to Admission medications   Medication Sig Start Date End Date Taking? Authorizing Provider  ACIDOPHILUS LACTOBACILLUS PO Take by mouth daily.    [provider]  amLODipine (NORVASC) 5 MG tablet Take 5 mg by mouth daily.    [provider]    Calcium Carb-Cholecalciferol (CALCIUM 600/VITAMIN D3 PO) Take 1 tablet by mouth daily.    [provider]  Cholecalciferol (VITAMIN D-3) 5000 units TABS Take 5,000 Units by mouth daily.    [provider]  clopidogrel (PLAVIX) 75 MG tablet Take 1 tablet (75 mg total) by mouth daily. 11/25/17   Bettey Costa, MD  cyanocobalamin 2000 MCG tablet Take 2,000 mcg by mouth daily.    [provider]  Dexlansoprazole 30 MG capsule Take 30 mg by mouth daily.    [provider]  docusate sodium (COLACE) 100 MG capsule Take 100 mg by mouth 2 (two) times daily.    [provider]  hydrochlorothiazide (HYDRODIURIL) 12.5 MG tablet Take 12.5 mg by mouth daily.  05/22/17   [provider]  lipase/protease/amylase (CREON) 36000 UNITS CPEP capsule Take 108,000 Units by mouth 3 (three) times daily before meals.    [provider]  lisinopril (PRINIVIL,ZESTRIL) 40 MG tablet Take 40 mg by mouth daily.    [provider]  lubiprostone (AMITIZA) 24 MCG capsule Take 24 mcg by mouth 2 (two) times daily with a meal.    [provider]  magnesium oxide (MAG-OX) 400 MG tablet Take 400 mg by mouth daily.    [provider]  naproxen sodium (ALEVE) 220 MG tablet Take 220-440 mg by mouth 2 (two) times daily as needed (pain).    [provider]  NOVOLOG 100 UNIT/ML injection Inject 40 Units into the skin See admin instructions. Up to 40u via insulin pump.    [provider]  nystatin cream (MYCOSTATIN) Apply 1 application topically 2 (two) times daily.    [provider]  Potassium Gluconate 595 MG CAPS Take by mouth as needed.    [provider]  promethazine (PHENERGAN) 12.5 MG tablet Take 12.5 mg by mouth every 6 (six) hours as needed for nausea or vomiting.    [provider]  vitamin E 400 UNIT capsule Take 400 Units by mouth daily.    [provider]    Allergies Metoclopramide;  Morphine and related; Morpholine salicylate; Latex; Demerol [meperidine]; Dilaudid [hydromorphone hcl]; Isosorbide nitrate; Sulfa antibiotics; Darvon [propoxyphene]; Flexeril [cyclobenzaprine]; Levaquin [levofloxacin in d5w]; Nsaids; Soma [carisoprodol]; and Zofran [ondansetron hcl]  Family History  Problem Relation Age of Onset  . Cirrhosis Mother   . Colon cancer Mother 59       TAH/BSO in ~71; deceased at 79  . Hypertension Mother   . Breast cancer Mother 61       unconfirmed  . Hypertension Father   . Prostate cancer Father 68       currently 66  . Other  Father        liver disease  . Breast cancer Maternal Aunt        age at dx unk.; currently 89  . Cervical cancer Maternal Aunt   . Cancer Maternal Uncle        unk. type; currently 81s  . Stomach cancer Paternal Aunt 66       deceased at 78  . Breast cancer Maternal Grandmother 30       possibly bilateral in 60s; deceased 40  . Non-Hodgkin's lymphoma Daughter        unconfirmed; currently 79  . Cancer Maternal Aunt        hematologic malignancy; deceased 32    Social History Social History   Tobacco Use  . Smoking status: Never Smoker  . Smokeless tobacco: Never Used  Substance Use Topics  . Alcohol use: No  . Drug use: No    Review of Systems Constitutional: No fever/chills. Eyes: No visual changes. ENT: No sore throat. Cardiovascular: Denies chest pain. Respiratory: Denies shortness of breath.  Some pain worsened with deep inspiration.  Chest pain seems worse when she presses on her moves the left shoulder around. Gastrointestinal: No abdominal pain except for intermittent crampy abdominal pain which she is experienced for months and is unchanged.   Genitourinary: Negative for dysuria. Musculoskeletal: Negative for back pain.  No ripping, tearing, or moving pain. Skin: Negative for rash. Neurological: Negative for headaches, areas of focal weakness or  numbness.    ____________________________________________   PHYSICAL EXAM:  VITAL SIGNS: ED Triage Vitals  Enc Vitals Group     BP 02/16/18 1720 (!) 168/96     Pulse Rate 02/16/18 1720 98     Resp 02/16/18 1720 18     Temp 02/16/18 1720 99 F (37.2 C)     Temp Source 02/16/18 1720 Oral     SpO2 02/16/18 1720 98 %     Weight 02/16/18 1721 147 lb (66.7 kg)     Height 02/16/18 1721 5' 4"  (1.626 m)     Head Circumference --      Peak Flow --      Pain Score 02/16/18 1720 10     Pain Loc --      Pain Edu? --      Excl. in [place]? --     Constitutional: Alert and oriented. Well appearing and in no acute distress.  She is resting quite comfortably.  Appears comfortable. Eyes: Conjunctivae are normal. Head: Atraumatic. Nose: No congestion/rhinnorhea. Mouth/Throat: Mucous membranes are moist. Neck: No stridor.  Cardiovascular: Normal rate, regular rhythm. Grossly normal heart sounds.  Good peripheral circulation. Respiratory: Normal respiratory effort.  No retractions. Lungs CTAB.  Reports tenderness across the left upper chest to palpation.  No rash.  Speaks in full and clear sentences. Gastrointestinal: Soft and nontender. No distention. Musculoskeletal: No lower extremity tenderness nor edema. Neurologic:  Normal speech and language. No gross focal neurologic deficits are appreciated.  Skin:  Skin is warm, dry and intact. No rash noted. Psychiatric: Mood and affect are normal. Speech and behavior are normal.  ____________________________________________   LABS (all labs ordered are listed, but only abnormal results are displayed)  Labs Reviewed  BASIC METABOLIC PANEL - Abnormal; Notable for the following components:      Result Value   Potassium 3.1 (*)    Glucose, Bld 187 (*)    Calcium 8.3 (*)    All other components within normal limits  CBC  TROPONIN  I  PROTIME-INR  FIBRIN DERIVATIVES D-DIMER ([facility] ONLY)  TROPONIN I    ____________________________________________  EKG  Reviewed entered by me at 1710 Heart rate 105 QRS 80 QTc 430 Sinus tachycardia, no evidence of acute ischemia ____________________________________________  RADIOLOGY  Dg Chest 2 View  Result Date: 02/16/2018 CLINICAL DATA:  Chest pain radiating to back beginning this morning. EXAM: CHEST - 2 VIEW COMPARISON:  11/22/2017 FINDINGS: Right IJ Port-A-Cath unchanged. Lungs are adequately inflated and otherwise clear. Cardiomediastinal silhouette and remainder of the exam is unchanged. IMPRESSION: No active cardiopulmonary disease. Electronically Signed   By: Marin Olp M.D.   On: 02/16/2018 18:08     ____________________________________________   PROCEDURES  Procedure(s) performed: None  Procedures  Critical Care performed: No  ____________________________________________   INITIAL IMPRESSION / ASSESSMENT AND PLAN / ED COURSE  Pertinent labs & imaging results that were available during my care of the patient were reviewed by me and considered in my medical decision making (see chart for details).   Differential diagnosis includes, but is not limited to, ACS, aortic dissection, pulmonary embolism, cardiac tamponade, pneumothorax, pneumonia, pericarditis, myocarditis, GI-related causes including esophagitis/gastritis, and musculoskeletal chest wall pain.  Patient's presentation today and clinical examination seem to suggest this likely a musculoskeletal etiology of her chest pain.  However, she does report a pleuritic component to the pain, and we will screen for pulmonary embolism with d-dimer as she is low risk.  Denies any ripping tearing or moving pain, does not appear to show any signs or symptoms suggest dissection.  Chest x-ray is clear with normal mediastinum.  She has had recent provocative testing in October which was negative for ischemia.  Patient reports atypical symptoms, does have diabetes and hypertension but no  personal history of coronary disease.  Initial EKG reassuring, first troponin is normal.  A find it unlikely represent acute coronary syndrome, heart score low risk.  ----------------------------------------- 11:50 PM on 02/16/2018 -----------------------------------------  Patient was offered hydrocodone, did not end up needing it.  She reports her pain is improved, she is resting comfortably in no distress.  D-dimer less than 500, low risk for PE by [place].   ----------------------------------------- 11:57 PM on 02/16/2018 -----------------------------------------  Reviewed test results, patient reports she feels improved.  Resting comfortably, patient comfortable with plan for discharge with careful return precautions if second troponin is normal.  Currently pending in the lab.  Return precautions and treatment recommendations and follow-up discussed with the patient who is agreeable with the plan.     Ongoing care assigned to Dr. Mable Paris, follow-up on repeat troponin.  If negative would anticipate discharge to follow-up closely with her primary care physician.  Atypical chest pain.  ____________________________________________   FINAL CLINICAL IMPRESSION(S) / ED DIAGNOSES  Final diagnoses:  Atypical chest pain        Note:  This document was prepared using Dragon voice recognition software and may include unintentional dictation errors       Delman Kitten, MD 02/16/18 2359

## 2018-02-16 NOTE — Discharge Instructions (Addendum)
You have been seen in the Emergency Department (ED) today for chest pain.  As we have discussed todays test results are normal, but you may require further testing.  Please follow up with the recommended doctor as instructed above in these documents regarding todays emergent visit and your recent symptoms to discuss further management.  Continue to take your regular medications. If you are not doing so already, please also take a daily baby aspirin (81 mg), at least until you follow up with your doctor.  Return to the Emergency Department (ED) if you experience any further chest pain/pressure/tightness, difficulty breathing, or sudden sweating, or other symptoms that concern you.

## 2018-02-16 NOTE — ED Triage Notes (Signed)
Pt presents with chest pain that awakened her at 2am and then got worse. She the pain is in her upper chest and radiates to her back. Pt has hx of tia in November. Pt alert & oriented with NAD noted.

## 2018-02-17 LAB — TROPONIN I: Troponin I: <0.03 ng/mL (ref <0.03)

## 2018-02-19 ENCOUNTER — Other Ambulatory Visit
Admission: RE | Admit: 2018-02-19 | Discharge: 2018-02-19 | Disposition: A | Discharge status: Home / Self Care | Payer: Medicare HMO | Source: Ambulatory Visit | Attending: Surgery | Admitting: Surgery

## 2018-02-19 ENCOUNTER — Ambulatory Visit (INDEPENDENT_AMBULATORY_CARE_PROVIDER_SITE_OTHER): Payer: Medicare HMO | Admitting: Surgery

## 2018-02-19 ENCOUNTER — Encounter: Payer: Self-pay | Admitting: *Deleted

## 2018-02-19 ENCOUNTER — Encounter: Payer: Self-pay | Admitting: Surgery

## 2018-02-19 ENCOUNTER — Other Ambulatory Visit: Payer: Self-pay

## 2018-02-19 VITALS — BP 156/100 | HR 80 | Temp 97.2°F | Ht 64.0 in | Wt 147.0 lb

## 2018-02-19 DIAGNOSIS — M5416 Radiculopathy, lumbar region: Secondary | ICD-10-CM | POA: Diagnosis not present

## 2018-02-19 DIAGNOSIS — K439 Ventral hernia without obstruction or gangrene: Secondary | ICD-10-CM

## 2018-02-19 DIAGNOSIS — M5136 Other intervertebral disc degeneration, lumbar region: Secondary | ICD-10-CM | POA: Diagnosis not present

## 2018-02-19 DIAGNOSIS — K760 Fatty (change of) liver, not elsewhere classified: Secondary | ICD-10-CM | POA: Diagnosis not present

## 2018-02-19 LAB — CBC WITH DIFFERENTIAL/PLATELET
Abs Immature Granulocytes: 0.02 10*3/uL (ref 0.00–0.07)
Basophils Absolute: 0 10*3/uL (ref 0.0–0.1)
Basophils Relative: 1 %
Eosinophils Absolute: 0.1 10*3/uL (ref 0.0–0.5)
Eosinophils Relative: 1 %
HCT: 38 % (ref 36.0–46.0)
Hemoglobin: 13.5 g/dL (ref 12.0–15.0)
Immature Granulocytes: 0 %
Lymphocytes Relative: 36 %
Lymphs Abs: 2.3 10*3/uL (ref 0.7–4.0)
MCH: 30.5 pg (ref 26.0–34.0)
MCHC: 35.5 g/dL (ref 30.0–36.0)
MCV: 86 fL (ref 80.0–100.0)
Monocytes Absolute: 0.3 10*3/uL (ref 0.1–1.0)
Monocytes Relative: 5 %
Neutro Abs: 3.7 10*3/uL (ref 1.7–7.7)
Neutrophils Relative %: 57 %
Platelets: 216 10*3/uL (ref 150–400)
RBC: 4.42 MIL/uL (ref 3.87–5.11)
RDW: 12 % (ref 11.5–15.5)
WBC: 6.6 10*3/uL (ref 4.0–10.5)
nRBC: 0 % (ref 0.0–0.2)

## 2018-02-19 LAB — HEPATIC FUNCTION PANEL
ALT: 34 U/L (ref 0–44)
AST: 31 U/L (ref 15–41)
Albumin: 4.1 g/dL (ref 3.5–5.0)
Alkaline Phosphatase: 104 U/L (ref 38–126)
Bilirubin, Direct: 0.3 mg/dL — ABNORMAL HIGH (ref 0.0–0.2)
Indirect Bilirubin: 3.7 mg/dL — ABNORMAL HIGH (ref 0.3–0.9)
Total Bilirubin: 4 mg/dL — ABNORMAL HIGH (ref 0.3–1.2)
Total Protein: 7.3 g/dL (ref 6.5–8.1)

## 2018-02-19 LAB — APTT: aPTT: 31 seconds (ref 24–36)

## 2018-02-19 LAB — PROTIME-INR
INR: 0.97
Prothrombin Time: 12.8 seconds (ref 11.4–15.2)

## 2018-02-19 MED ORDER — GABAPENTIN 300 MG PO CAPS: 300.0000 mg | ORAL_CAPSULE | Freq: Three times a day (TID) | ORAL | 0 refills | Status: DC

## 2018-02-19 NOTE — Patient Instructions (Addendum)
Patient will need to return to the office in 2 weeks with lab work (PTT, INR, and a CBC) she will also need a indirect/direct bilirubin. And a LFT. Dr.Pabon will discuss surgery at the next visit.   Call the office with any questions or concerns.  Ventral Hernia  A ventral hernia is a bulge of tissue from inside the abdomen that pushes through a weak area of the muscles that form the front wall of the abdomen. The tissues inside the abdomen are inside a sac (peritoneum). These tissues include the small intestine, large intestine, and the fatty tissue that covers the intestines (omentum). Sometimes, the bulge that forms a hernia contains intestines. Other hernias contain only fat. Ventral hernias do not go away without surgical treatment. There are several types of ventral hernias. You may have:  A hernia at an incision site from previous abdominal surgery (incisional hernia).  A hernia just above the belly button (epigastric hernia), or at the belly button (umbilical hernia). These types of hernias can develop from heavy lifting or straining.  A hernia that comes and goes (reducible hernia). It may be visible only when you lift or strain. This type of hernia can be pushed back into the abdomen (reduced).  A hernia that traps abdominal tissue inside the hernia (incarcerated hernia). This type of hernia does not reduce.  A hernia that cuts off blood flow to the tissues inside the hernia (strangulated hernia). The tissues can start to die if this happens. This is a very painful bulge that cannot be reduced. A strangulated hernia is a medical emergency. What are the causes? This condition is caused by abdominal tissue putting pressure on an area of weakness in the abdominal muscles. What increases the risk? The following factors may make you more likely to develop this condition:  Being female.  Being 76 or older.  Being overweight or obese.  Having had previous abdominal surgery, especially  if there was an infection after surgery.  Having had an injury to the abdominal wall.  Having had several pregnancies.  Having a buildup of fluid inside the abdomen (ascites). What are the signs or symptoms? The only symptom of a ventral hernia may be a painless bulge in the abdomen. A reducible hernia may be visible only when you strain, cough, or lift. Other symptoms may include:  Dull pain.  A feeling of pressure. Signs and symptoms of a strangulated hernia may include:  Increasing pain.  Nausea and vomiting.  Pain when pressing on the hernia.  The skin over the hernia turning red or purple.  Constipation.  Blood in the stool (feces). How is this diagnosed? This condition may be diagnosed based on:  Your symptoms.  Your medical history.  A physical exam. You may be asked to cough or strain while standing. These actions increase the pressure inside your abdomen and force the hernia through the opening in your muscles. Your health care provider may try to reduce the hernia by pressing on it.  Imaging studies, such as an ultrasound or CT scan. How is this treated? This condition is treated with surgery. If you have a strangulated hernia, surgery is done as soon as possible. If your hernia is small and not incarcerated, you may be asked to lose some weight before surgery. Follow these instructions at home:  Follow instructions from your health care provider about eating or drinking restrictions.  If you are overweight, your health care provider may recommend that you increase your activity level  and eat a healthier diet.  Do not lift anything that is heavier than 10 lb (4.5 kg).  Return to your normal activities as told by your health care provider. Ask your health care provider what activities are safe for you. You may need to avoid activities that increase pressure on your hernia.  Take over-the-counter and prescription medicines only as told by your health care  provider.  Keep all follow-up visits as told by your health care provider. This is important. Contact a health care provider if:  Your hernia gets larger.  Your hernia becomes painful. Get help right away if:  Your hernia becomes increasingly painful.  You have pain along with any of the following: ? Changes in skin color in the area of the hernia. ? Nausea. ? Vomiting. ? Fever. Summary  A ventral hernia is a bulge of tissue from inside the abdomen that pushes through a weak area of the muscles that form the front wall of the abdomen.  This condition is treated with surgery, which may be urgent depending on your hernia.  Do not lift anything that is heavier than 10 lb (4.5 kg), and follow activity instructions from your health care provider. This information is not intended to replace advice given to you by your health care provider. Make sure you discuss any questions you have with your health care provider. Document Released: 01/18/2012 Document Revised: 03/15/2017 Document Reviewed: 08/22/2016 Elsevier Interactive Patient Education  2019 [employer].

## 2018-02-19 NOTE — H&P (View-Only) (Signed)
Outpatient Surgical Follow Up  02/19/2018  Amber Christensen is an 53 y.o. female.   Chief Complaint  Patient presents with  . 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  After that the patient had multiple operations to include a bowel resection.  She also has had a previous hysterectomy. She comes for a follow-up regarding her ventral hernia.  She did have a CT scan that I have personally review showing evidence of 2 small ventral hernias with: Above the umbilicus.  There is no evidence of incarceration or strangulation.  Of note the patient does not have any evidence of cirrhosis or evidence of ascites within the CT scan.  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  Her latest bilirubin was 3.8 but there is no other abnormalities on her lab work.  LFTs are otherwise normal, cbc is normal alkaline phosphatase normal creatinine normal INR.  I have calculated her meld score and is 11 which gives her a 6% mortality the operatively.  She is also a child to be according to this classification wish might overestimate her surgical risk. SHe is able to perform more than 4 mets of activity without any shortness of breath or chest pain.  She denies any easy bruising or bleeding. She reports moderate intermittent abdominal pain that starts around the hernia side.  It is sharp and no specific alleviating or aggravating factors. We Do not have any operative reports available at this time. Discussed this case in detail with Dr. Gustavo Lah that was going to perform a colonoscopy and aborted procedure giving the fear of potential perforation. I have reviewed records from [facility] hepatology, she has had  liver bx showing Hepatic steatosis and NAFLD ( fatty liver) She is DM on an insulin pump last hb1ac was 8 which was better controlled She does have hx IBS some constipation and diarrhea    Past Medical History:  Diagnosis Date  . Allergy   . Anemia   . Anxiety   . 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 Bipolar disorder ([place])   . Bladder mass   . Blood transfusion without reported diagnosis   . Breast mass   . Carpal tunnel syndrome   . Cervical cancer ([place])   . Chronic pancreatitis ([place])   . Cirrhosis ([place])   . Clotting disorder ([place])   . Depression   . Diabetes mellitus without complication ([place])   . Esophagitis   . GERD (gastroesophageal reflux disease)   . Headache   . Heart disease   . 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 Hypertension   . Irritable bowel syndrome   . Kidney mass   . Liver disease   . NASH (nonalcoholic steatohepatitis)   . Pancreatitis   . Thyroid disease     Past Surgical History:  Procedure Laterality Date  . ABDOMINAL HYSTERECTOMY    . APPENDECTOMY    . BLADDER SURGERY    . BOWEL RESECTION    . BREAST BIOPSY Left 1989  . CARDIAC CATHETERIZATION    . CHOLECYSTECTOMY    . CYST REMOVAL HAND Left 1992   Ganglion cyst removed from Left Wrist  . CYSTECTOMY    . DILATATION & CURETTAGE/HYSTEROSCOPY WITH MYOSURE    .  ERCP    . ESOPHAGOGASTRODUODENOSCOPY    . KNEE ARTHROSCOPY Left 1992  . LIVER BIOPSY    . PANCREAS SURGERY    . PORTA CATH INSERTION N/A 04/10/2017   Procedure: PORTA CATH INSERTION;  Surgeon: Algernon Huxley, MD;  Location: [facility];  Service: Cardiovascular;  Laterality: N/A;  . REPAIR OF ESOPHAGUS  2012   3 clamps placed in esophagus  . SMALL BOWEL REPAIR    . TONSILLECTOMY      Family History  Problem Relation Age of Onset  . Cirrhosis Mother   . Colon cancer Mother 10       TAH/BSO in ~30; deceased at 45  . Hypertension Mother   . Breast cancer Mother 65       unconfirmed  . Hypertension Father   . Prostate cancer Father  48       currently 44  . Other Father        liver disease  . Breast cancer Maternal Aunt        age at dx unk.; currently 19  . Cervical cancer Maternal Aunt   . Cancer Maternal Uncle        unk. type; currently 58s  . Stomach cancer Paternal Aunt 23       deceased at 55  . Breast cancer Maternal Grandmother 86       possibly bilateral in 61s; deceased 46  . Non-Hodgkin's lymphoma Daughter        unconfirmed; currently 34  . Cancer Maternal Aunt        hematologic malignancy; deceased 80    Social History:  reports that she has never smoked. She has never used smokeless tobacco. She reports current alcohol use. She reports that she does not use drugs.  Allergies:  Allergies  Allergen Reactions  . Metoclopramide Hives  . Morphine And Related Anaphylaxis and Other (See Comments)    Pt states that this medication causes cardiac arrest.   Pt states that this medication causes cardiac arrest.    . Morpholine Salicylate Nausea And Vomiting, Other (See Comments) and Rash    CHF  . Latex Rash  . Demerol [Meperidine] Hives and Other (See Comments)    Pt states that this medication causes cardiac arrest.    . Dilaudid [Hydromorphone Hcl] Nausea And Vomiting and Other (See Comments)    Pt states that this medication causes cardiac arrest.    . Isosorbide Nitrate Other (See Comments)    Other reaction(s): Headache   . Sulfa Antibiotics Nausea And Vomiting and Other (See Comments)    Other reaction(s): Vomiting   . Darvon [Propoxyphene] Nausea And Vomiting and Rash  . Flexeril [Cyclobenzaprine] Nausea And Vomiting and Rash  . Levaquin [Levofloxacin In D5w] Nausea And Vomiting and Rash  . Nsaids Rash and Other (See Comments)    Naproxen causes a rash; patient is able to tolerate Advil  Naproxen causes a rash; patient is able to tolerate Advil   . Soma [Carisoprodol] Nausea And Vomiting and Rash  . Zofran [Ondansetron Hcl] Nausea And Vomiting and Rash    Medications  reviewed.    ROS Full ROS performed and is otherwise negative other than what is stated in HPI   BP (!) 156/100   Pulse 80   Temp (!) 97.2 F (36.2 C) (Temporal)   Ht 5' 4"  (1.626 m)   Wt 147 lb (66.7 kg)   SpO2 96%   BMI 25.23 kg/m   Physical Exam  Vitals signs and nursing note reviewed. Exam conducted with a chaperone present.  Eyes:     Extraocular Movements: Extraocular movements intact.     Pupils: Pupils are equal, round, and reactive to light.  Neck:     Musculoskeletal: Normal range of motion and neck supple. No muscular tenderness.     Vascular: No carotid bruit.  Cardiovascular:     Rate and Rhythm: Normal rate and regular rhythm.     Pulses: Normal pulses.     Heart sounds: Normal heart sounds. No murmur.  Pulmonary:     Effort: Pulmonary effort is normal. No respiratory distress.     Breath sounds: No stridor. No wheezing or rhonchi.  Abdominal:     General: There is no distension.     Palpations: Abdomen is soft. There is no mass.     Tenderness: There is no abdominal tenderness. There is no guarding or rebound.     Hernia: A hernia is present.     Comments: Midline laparotomy scar, Two small ventral hernia one above umbilicus and another one below. Each about 2 cms diameter, reducible. No peritonitis and no ascites. No evidence of portal HTN  Skin:    Capillary Refill: Capillary refill takes less than 2 seconds.  Neurological:     General: No focal deficit present.     Mental Status: She is alert. She is disoriented.  Psychiatric:        Mood and Affect: Mood normal.        Behavior: Behavior normal.        Thought Content: Thought content normal.       Assessment/Plan:  1. Ventral hernia without obstruction or gangrene in a pt with NAFLD, We will recheck  Baseline lft, coags. SHe might benefit from elective hernia surgery. D/W her in detail that I needed to stratify her risks more. She is very functional and MELD score will provide a more accurate  assessment rather than her Child score. I will see her back in a few weeks. No need for emergent surgical intervention - Hepatic function panel; Future - PTT; Future - Protime-INR; Future - CBC w/Diff/Platelet; Future    Greater than 50% of the 40 minutes  visit was spent in counseling/coordination of care   Caroleen Hamman, MD [facility]

## 2018-02-19 NOTE — Progress Notes (Addendum)
Patient was sent to [facility] today to have the following labs drawn: LFT's, indirect/direct bilirubin (fractionated bilirubin), PTT, INR, and CBC.   The patient will follow up with Dr. Dahlia Byes in 2 weeks as scheduled.   *Upon check-out today, patient forgot to ask Dr. Dahlia Byes and wanted to see what she could take for pain. Per patient, she is out of gabapentin and was told not to take anymore Aleve. Dr. Dahlia Byes notified and a prescription for gabapentin was sent in by the nurse today.

## 2018-02-19 NOTE — Progress Notes (Signed)
Outpatient Surgical Follow Up  02/19/2018  Amber Christensen is an 53 y.o. female.   Chief Complaint  Patient presents with  . 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  After that the patient had multiple operations to include a bowel resection.  She also has had a previous hysterectomy. She comes for a follow-up regarding her ventral hernia.  She did have a CT scan that I have personally review showing evidence of 2 small ventral hernias with: Above the umbilicus.  There is no evidence of incarceration or strangulation.  Of note the patient does not have any evidence of cirrhosis or evidence of ascites within the CT scan.  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  Her latest bilirubin was 3.8 but there is no other abnormalities on her lab work.  LFTs are otherwise normal, cbc is normal alkaline phosphatase normal creatinine normal INR.  I have calculated her meld score and is 11 which gives her a 6% mortality the operatively.  She is also a child to be according to this classification wish might overestimate her surgical risk. SHe is able to perform more than 4 mets of activity without any shortness of breath or chest pain.  She denies any easy bruising or bleeding. She reports moderate intermittent abdominal pain that starts around the hernia side.  It is sharp and no specific alleviating or aggravating factors. We Do not have any operative reports available at this time. Discussed this case in detail with Dr. Gustavo Lah that was going to perform a colonoscopy and aborted procedure giving the fear of potential perforation. I have reviewed records from [facility] hepatology, she has had  liver bx showing Hepatic steatosis and NAFLD ( fatty liver) She is DM on an insulin pump last hb1ac was 8 which was better controlled She does have hx IBS some constipation and diarrhea    Past Medical History:  Diagnosis Date  . Allergy   . Anemia   . Anxiety   . 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 Bipolar disorder ([place])   . Bladder mass   . Blood transfusion without reported diagnosis   . Breast mass   . Carpal tunnel syndrome   . Cervical cancer ([place])   . Chronic pancreatitis ([place])   . Cirrhosis ([place])   . Clotting disorder ([place])   . Depression   . Diabetes mellitus without complication ([place])   . Esophagitis   . GERD (gastroesophageal reflux disease)   . Headache   . Heart disease   . 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 Hypertension   . Irritable bowel syndrome   . Kidney mass   . Liver disease   . NASH (nonalcoholic steatohepatitis)   . Pancreatitis   . Thyroid disease     Past Surgical History:  Procedure Laterality Date  . ABDOMINAL HYSTERECTOMY    . APPENDECTOMY    . BLADDER SURGERY    . BOWEL RESECTION    . BREAST BIOPSY Left 1989  . CARDIAC CATHETERIZATION    . CHOLECYSTECTOMY    . CYST REMOVAL HAND Left 1992   Ganglion cyst removed from Left Wrist  . CYSTECTOMY    . DILATATION & CURETTAGE/HYSTEROSCOPY WITH MYOSURE    .  ERCP    . ESOPHAGOGASTRODUODENOSCOPY    . KNEE ARTHROSCOPY Left 1992  . LIVER BIOPSY    . PANCREAS SURGERY    . PORTA CATH INSERTION N/A 04/10/2017   Procedure: PORTA CATH INSERTION;  Surgeon: Algernon Huxley, MD;  Location: [facility];  Service: Cardiovascular;  Laterality: N/A;  . REPAIR OF ESOPHAGUS  2012   3 clamps placed in esophagus  . SMALL BOWEL REPAIR    . TONSILLECTOMY      Family History  Problem Relation Age of Onset  . Cirrhosis Mother   . Colon cancer Mother 53       TAH/BSO in ~20; deceased at 25  . Hypertension Mother   . Breast cancer Mother 57       unconfirmed  . Hypertension Father   . Prostate cancer Father  64       currently 33  . Other Father        liver disease  . Breast cancer Maternal Aunt        age at dx unk.; currently 3  . Cervical cancer Maternal Aunt   . Cancer Maternal Uncle        unk. type; currently 15s  . Stomach cancer Paternal Aunt 42       deceased at 52  . Breast cancer Maternal Grandmother 52       possibly bilateral in 70s; deceased 11  . Non-Hodgkin's lymphoma Daughter        unconfirmed; currently 60  . Cancer Maternal Aunt        hematologic malignancy; deceased 70    Social History:  reports that she has never smoked. She has never used smokeless tobacco. She reports current alcohol use. She reports that she does not use drugs.  Allergies:  Allergies  Allergen Reactions  . Metoclopramide Hives  . Morphine And Related Anaphylaxis and Other (See Comments)    Pt states that this medication causes cardiac arrest.   Pt states that this medication causes cardiac arrest.    . Morpholine Salicylate Nausea And Vomiting, Other (See Comments) and Rash    CHF  . Latex Rash  . Demerol [Meperidine] Hives and Other (See Comments)    Pt states that this medication causes cardiac arrest.    . Dilaudid [Hydromorphone Hcl] Nausea And Vomiting and Other (See Comments)    Pt states that this medication causes cardiac arrest.    . Isosorbide Nitrate Other (See Comments)    Other reaction(s): Headache   . Sulfa Antibiotics Nausea And Vomiting and Other (See Comments)    Other reaction(s): Vomiting   . Darvon [Propoxyphene] Nausea And Vomiting and Rash  . Flexeril [Cyclobenzaprine] Nausea And Vomiting and Rash  . Levaquin [Levofloxacin In D5w] Nausea And Vomiting and Rash  . Nsaids Rash and Other (See Comments)    Naproxen causes a rash; patient is able to tolerate Advil  Naproxen causes a rash; patient is able to tolerate Advil   . Soma [Carisoprodol] Nausea And Vomiting and Rash  . Zofran [Ondansetron Hcl] Nausea And Vomiting and Rash    Medications  reviewed.    ROS Full ROS performed and is otherwise negative other than what is stated in HPI   BP (!) 156/100   Pulse 80   Temp (!) 97.2 F (36.2 C) (Temporal)   Ht 5' 4"  (1.626 m)   Wt 147 lb (66.7 kg)   SpO2 96%   BMI 25.23 kg/m   Physical Exam  Vitals signs and nursing note reviewed. Exam conducted with a chaperone present.  Eyes:     Extraocular Movements: Extraocular movements intact.     Pupils: Pupils are equal, round, and reactive to light.  Neck:     Musculoskeletal: Normal range of motion and neck supple. No muscular tenderness.     Vascular: No carotid bruit.  Cardiovascular:     Rate and Rhythm: Normal rate and regular rhythm.     Pulses: Normal pulses.     Heart sounds: Normal heart sounds. No murmur.  Pulmonary:     Effort: Pulmonary effort is normal. No respiratory distress.     Breath sounds: No stridor. No wheezing or rhonchi.  Abdominal:     General: There is no distension.     Palpations: Abdomen is soft. There is no mass.     Tenderness: There is no abdominal tenderness. There is no guarding or rebound.     Hernia: A hernia is present.     Comments: Midline laparotomy scar, Two small ventral hernia one above umbilicus and another one below. Each about 2 cms diameter, reducible. No peritonitis and no ascites. No evidence of portal HTN  Skin:    Capillary Refill: Capillary refill takes less than 2 seconds.  Neurological:     General: No focal deficit present.     Mental Status: She is alert. She is disoriented.  Psychiatric:        Mood and Affect: Mood normal.        Behavior: Behavior normal.        Thought Content: Thought content normal.       Assessment/Plan:  1. Ventral hernia without obstruction or gangrene in a pt with NAFLD, We will recheck  Baseline lft, coags. SHe might benefit from elective hernia surgery. D/W her in detail that I needed to stratify her risks more. She is very functional and MELD score will provide a more accurate  assessment rather than her Child score. I will see her back in a few weeks. No need for emergent surgical intervention - Hepatic function panel; Future - PTT; Future - Protime-INR; Future - CBC w/Diff/Platelet; Future    Greater than 50% of the 40 minutes  visit was spent in counseling/coordination of care   Caroleen Hamman, MD [facility]

## 2018-02-22 ENCOUNTER — Telehealth: Payer: Self-pay | Admitting: *Deleted

## 2018-02-22 NOTE — Telephone Encounter (Signed)
I talked with the patient and she states she is "uncomfortable". Bowels are moving a small amount. Phenergan helped her nausea and vomiting.  She went to her PCP yesterday and they stated no virus but to call us to possibly schedule surgery sooner. The rash she had yesterday is gone today. Aware Dr Dahlia Byes is in surgery.

## 2018-02-22 NOTE — Telephone Encounter (Signed)
Dr Dahlia Byes reviewed chart and labs, and does not feel it is hernia related, recommend her touching base with GI (Dr Donnella Sham), history of cirrhosis and has elevated bilirubin levels, pt aware and agrees, appreciates call.

## 2018-02-22 NOTE — Telephone Encounter (Signed)
Patient called and is having a lot of pain in her lower abdominal area, she is also vomiting which started last night. She reports no fever or chills. Patient stated that she can not even take her dog outside due to the amount of pain she is in. She is schedule to come back in to see Dr.Pabon on 03/05/18

## 2018-02-22 NOTE — Progress Notes (Signed)
Thank you :)

## 2018-03-05 ENCOUNTER — Encounter: Payer: Self-pay | Admitting: *Deleted

## 2018-03-05 ENCOUNTER — Ambulatory Visit (INDEPENDENT_AMBULATORY_CARE_PROVIDER_SITE_OTHER): Payer: Medicare HMO | Admitting: Surgery

## 2018-03-05 ENCOUNTER — Encounter: Payer: Self-pay | Admitting: Surgery

## 2018-03-05 ENCOUNTER — Other Ambulatory Visit: Payer: Self-pay

## 2018-03-05 ENCOUNTER — Telehealth: Payer: Self-pay | Admitting: *Deleted

## 2018-03-05 VITALS — BP 123/90 | HR 104 | Temp 96.1°F | Ht 64.0 in | Wt 147.2 lb

## 2018-03-05 DIAGNOSIS — K439 Ventral hernia without obstruction or gangrene: Secondary | ICD-10-CM

## 2018-03-05 NOTE — Telephone Encounter (Signed)
Patient contacted and aware surgery has been scheduled for 03-15-18 at [facility] with Dr. Dahlia Byes.  The patient is aware to Pre-Admit on 03-09-18 at 1 pm. Patient will check in at the [place], [address] (first floor).   The patient is aware to call the office should she have further questions.

## 2018-03-05 NOTE — Patient Instructions (Signed)
Patient is to be scheduled for surgery with Dr.Pabon.   Call the office with any questions or concerns.

## 2018-03-05 NOTE — Progress Notes (Signed)
Patient's surgery to be scheduled for 03-15-18 at [facility] with Dr. Dahlia Byes.  The patient states that she no longer needs to be taking Plavix per Dr. Clayborn Bigness. Medication list has been updated accordingly. Dr. Dahlia Byes notified.   The patient is aware she will need to Pre-Admit. Patient will check in at the [place], [address] (first floor). Patient will be contacted with date and time once arranged.   The patient is aware to call the office should she have further questions.

## 2018-03-05 NOTE — Progress Notes (Signed)
Outpatient Surgical Follow Up  03/05/2018  Amber Christensen is an 53 y.o. female.   Chief Complaint  Patient presents with  . Follow-up     2 week f/u ventral hernia    HPI:  Patient is a 53 year old female well-known to me with a symptomatic ventral hernia with a prior extensive surgical history including necrosectomy, abdominal washouts and prior ventral hernia repair.  No separation with at least 15 years ago and that I have any records of it.  All this was done in [place].  She currently has recently diagnosed with questionable cirrhosis more more than likely Gilbert disease.  I have personally reviewed her CT showing no evidence of ascites no evidence of overt cirrhosis.  Repeat labs show normal INR with an increase of the total bilirubin to up to 4.,  And the indirect portion is 3.7.  Normal alkaline phosphatase.  Normal creatinine.  Normal albumin. Child Score of B due to hyperbilirubinemia but I do think that this is overestimating her disease process given that her meld score is lower and more importantly that she suffers from [place] disease and indirect hyperbilirubinemia.  There is more importantly no evidence of portal hypertension or any ascites on clinical or imaging studies.  Past Medical History:  Diagnosis Date  . Allergy   . Anemia   . Anxiety   . 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 Bipolar disorder ([place])   . Bladder mass   . Blood transfusion without reported diagnosis   . Breast mass   . Carpal tunnel syndrome   . Cervical cancer ([place])   . Chronic pancreatitis ([place])   . Cirrhosis ([place])   . Clotting disorder ([place])   . Depression   . Diabetes mellitus without complication ([place])   . Esophagitis   . GERD (gastroesophageal reflux disease)   . Headache   . Heart disease   . 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 Hypertension   . Irritable bowel syndrome   . Kidney mass   . Liver disease   . NASH (nonalcoholic steatohepatitis)   . Pancreatitis   . Thyroid disease     Past  Surgical History:  Procedure Laterality Date  . ABDOMINAL HYSTERECTOMY    . APPENDECTOMY    . BLADDER SURGERY    . BOWEL RESECTION    . BREAST BIOPSY Left 1989  . CARDIAC CATHETERIZATION    . CHOLECYSTECTOMY    . CYST REMOVAL HAND Left 1992   Ganglion cyst removed from Left Wrist  . CYSTECTOMY    . DILATATION & CURETTAGE/HYSTEROSCOPY WITH MYOSURE    . ERCP    . ESOPHAGOGASTRODUODENOSCOPY    . KNEE ARTHROSCOPY Left 1992  . LIVER BIOPSY    . PANCREAS SURGERY    . PORTA CATH INSERTION N/A 04/10/2017   Procedure: PORTA CATH INSERTION;  Surgeon: Algernon Huxley, MD;  Location: [facility];  Service: Cardiovascular;  Laterality: N/A;  . REPAIR OF ESOPHAGUS  2012   3 clamps placed in esophagus  . SMALL BOWEL REPAIR    . TONSILLECTOMY      Family History  Problem Relation Age of Onset  . Cirrhosis Mother   . Colon cancer Mother 46       TAH/BSO in ~88; deceased at 82  . Hypertension Mother   . Breast cancer Mother 42       unconfirmed  . Hypertension Father   . Prostate cancer Father 70       currently 44  .  Other Father        liver disease  . Breast cancer Maternal Aunt        age at dx unk.; currently 43  . Cervical cancer Maternal Aunt   . Cancer Maternal Uncle        unk. type; currently 84s  . Stomach cancer Paternal Aunt 67       deceased at 26  . Breast cancer Maternal Grandmother 14       possibly bilateral in 54s; deceased 88  . Non-Hodgkin's lymphoma Daughter        unconfirmed; currently 46  . Cancer Maternal Aunt        hematologic malignancy; deceased 34    Social History:  reports that she has never smoked. She has never used smokeless tobacco. She reports current alcohol use. She reports that she does not use drugs.  Allergies:  Allergies  Allergen Reactions  . Metoclopramide Hives  . Morphine And Related Anaphylaxis and Other (See Comments)    Pt states that this medication causes cardiac arrest.   Pt states that this medication causes  cardiac arrest.    . Morpholine Salicylate Nausea And Vomiting, Other (See Comments) and Rash    CHF  . Latex Rash  . Demerol [Meperidine] Hives and Other (See Comments)    Pt states that this medication causes cardiac arrest.    . Dilaudid [Hydromorphone Hcl] Nausea And Vomiting and Other (See Comments)    Pt states that this medication causes cardiac arrest.    . Isosorbide Nitrate Other (See Comments)    Other reaction(s): Headache   . Sulfa Antibiotics Nausea And Vomiting and Other (See Comments)    Other reaction(s): Vomiting   . Darvon [Propoxyphene] Nausea And Vomiting and Rash  . Flexeril [Cyclobenzaprine] Nausea And Vomiting and Rash  . Levaquin [Levofloxacin In D5w] Nausea And Vomiting and Rash  . Nsaids Rash and Other (See Comments)    Naproxen causes a rash; patient is able to tolerate Advil  Naproxen causes a rash; patient is able to tolerate Advil   . Soma [Carisoprodol] Nausea And Vomiting and Rash  . Zofran [Ondansetron Hcl] Nausea And Vomiting and Rash    Medications reviewed.    ROS Full ROS performed and is otherwise negative other than what is stated in HPI   BP 123/90   Pulse (!) 104   Temp (!) 96.1 F (35.6 C) (Temporal)   Ht 5' 4"  (1.626 m)   Wt 147 lb 3.2 oz (66.8 kg)   SpO2 97%   BMI 25.27 kg/m   Physical Exam Vitals signs and nursing note reviewed. Exam conducted with a chaperone present.  Constitutional:      General: She is not in acute distress.    Appearance: Normal appearance. She is normal weight.  Eyes:     General:        Right eye: No discharge.        Left eye: No discharge.     Conjunctiva/sclera: Conjunctivae normal.  Neck:     Musculoskeletal: Normal range of motion and neck supple.  Cardiovascular:     Rate and Rhythm: Normal rate and regular rhythm.     Pulses: Normal pulses.     Heart sounds: Normal heart sounds.  Pulmonary:     Effort: Pulmonary effort is normal.     Breath sounds: Normal breath sounds. No  stridor. No rhonchi.  Abdominal:     General: Abdomen is flat. There is no distension.  Palpations: There is no mass.     Tenderness: There is no abdominal tenderness. There is no guarding or rebound.     Hernia: A hernia is present.     Comments: No Ascites or clinical evidence of portal hypertension  Musculoskeletal: Normal range of motion.  Skin:    General: Skin is warm and dry.     Capillary Refill: Capillary refill takes less than 2 seconds.  Neurological:     General: No focal deficit present.     Mental Status: She is alert and oriented to person, place, and time.  Psychiatric:        Mood and Affect: Mood normal.        Behavior: Behavior normal.        Thought Content: Thought content normal.        Judgment: Judgment normal.         Assessment/Plan: 53 year old with recurrent ventral hernia that is symptomatic in need for repair.  Discussed with the patient in detail about the procedure and I do think that she is reasonable candidate for robotic surgery.  Procedure discussed with patient detail.  Risk, benefits and possible complications including but not limited to: Bleeding, infection, recurrence, bowel injury and even death discussed with the patient in detail.  She understands and wishes to proceed.  Given the multiple and extensive surgeries anticipate that this might be a complicated case.  I have discussed with her in detail and she understands.  Greater than 50% of the 25 minutes  visit was spent in counseling/coordination of care   Caroleen Hamman, MD [facility]

## 2018-03-08 DIAGNOSIS — E559 Vitamin D deficiency, unspecified: Secondary | ICD-10-CM | POA: Diagnosis not present

## 2018-03-08 DIAGNOSIS — E538 Deficiency of other specified B group vitamins: Secondary | ICD-10-CM | POA: Diagnosis not present

## 2018-03-08 DIAGNOSIS — R252 Cramp and spasm: Secondary | ICD-10-CM | POA: Insufficient documentation

## 2018-03-08 DIAGNOSIS — I10 Essential (primary) hypertension: Secondary | ICD-10-CM | POA: Diagnosis not present

## 2018-03-09 ENCOUNTER — Encounter
Admission: RE | Admit: 2018-03-09 | Discharge: 2018-03-09 | Disposition: A | Discharge status: Home / Self Care | Payer: Medicare HMO | Source: Ambulatory Visit | Attending: Surgery | Admitting: Surgery

## 2018-03-09 ENCOUNTER — Other Ambulatory Visit: Payer: Self-pay

## 2018-03-09 DIAGNOSIS — Z01812 Encounter for preprocedural laboratory examination: Secondary | ICD-10-CM | POA: Insufficient documentation

## 2018-03-09 HISTORY — DX: Angina pectoris, unspecified: I20.9

## 2018-03-09 HISTORY — DX: Chronic kidney disease, unspecified: N18.9

## 2018-03-09 HISTORY — DX: Dyspnea, unspecified: R06.00

## 2018-03-09 HISTORY — DX: Cerebral infarction, unspecified: I63.9

## 2018-03-09 NOTE — Patient Instructions (Signed)
Your procedure is scheduled on: Thursday, March 15, 2018  Report to [facility]    DO NOT REPORT TO THE FIRST FLOOR TO REGISTER  To find out your arrival time please call [phone] between 1PM - 3PM on Wednesday, March 14, 2018   Remember: Instructions that are not followed completely may result in serious medical risk,  up to and including death, or upon the discretion of your surgeon and anesthesiologist your  surgery may need to be rescheduled.     _X__ 1. Do not eat food after midnight the night before your procedure.                 No gum chewing or hard candies.                     ABSOLUTELY NOTHING SOLID IN YOUR MOUTH AFTER MIDNIGHT                   You may drink clear liquids up to 2 hours before you are scheduled to arrive for your surgery-                    DO not drink clear liquids within 2 hours of the start of your surgery.                  Clear Liquids include:  water, apple juice without pulp, clear carbohydrate                 drink such as Clearfast of Gatorade, Black Coffee or Tea (Do not add                 anything to coffee or tea).  __X__2.  On the morning of surgery brush your teeth with toothpaste and water,                    You may rinse your mouth with mouthwash if you wish.                           Do not swallow any toothpaste of mouthwash.     _X__ 3.  No Alcohol for 24 hours before or after surgery.   _X__ 4.  Do Not Smoke or use e-cigarettes For 24 Hours Prior to Your Surgery.                 Do not use any chewable tobacco products for at least 6 hours prior to                 surgery.  ____  5.  Bring all medications with you on the day of surgery if instructed.   ____  6.  Notify your doctor if there is any change in your medical condition      (cold, fever, infections).     Do not wear jewelry, make-up, hairpins, clips or nail polish. Do not wear lotions, powders, or perfumes.  You may NOT wear deodorant. Do not shave 48 hours prior to surgery. Men may shave face and neck. Do not bring valuables to the hospital.    [facility] is not responsible for any belongings or valuables.  Contacts, dentures or bridgework may not be worn into surgery. Leave your suitcase in the car. After surgery it may be brought to your room. For patients admitted to the hospital, discharge time is  determined by your treatment team.   Patients discharged the day of surgery will not be allowed to drive home.   Please read over the following fact sheets that you were given:   PREPARING FOR SURGERY  ____ Take these medicines the morning of surgery with A SIP OF WATER:    1. DEXANSOPRAZOLE  2. GABAPENTIN  3. MAGNESIUM  4.  5.  6.  ____ Fleet Enema (as directed)   __X__ Use CHG Soap as directed  ____ Use inhalers on the day of surgery  ____ Stop metformin 2 days prior to surgery    ____ Take 1/2 of usual insulin dose the night before surgery. No insulin the morning          of surgery.                DO NOT TURN OFF YOUR INSULIN PUMP. CALL THE PHYSICIAN TO SEE                    IF SHE CAN SUGGEST ANYTHING SPECIAL OR SPECIFIC TO YOU.  __X__ Stop ALL ASPIRIN PRODUCTS TODAY  ___X_ Stop Anti-inflammatories AS OF TODAY                  TYLENOL IS ACCEPTABLE TO TAKE AT ANY TIME   _X___ Stop supplements until after surgery.                STOP ACIDOPHILUS / CALCIUM AND VITAMINS / VITAMIN E  ____ Bring C-Pap to the hospital.   CONTINUE TAKING:      CREON      HYDROCHLOROTHIAZIDE      LISINOPRIL      AMITOZA            BUT DO NOT TAKE ON THE DAY OF SURGERY  YOU MAY ALSO CONTINUE TAKING VITAMIN D3 AND VITAMIN B12 BUT NOT ON THE      DAY OF SURGERY.  [place].  HAVE STURDY SHOES/ SLIPPERS TO WALK AROUND WITH

## 2018-03-13 NOTE — Pre-Procedure Instructions (Signed)
SOPKE WITH PATIENT RE LEAVE PUMP ON UNTIL ARRIVAL DAY OF SURGERY PER DR SOLUM'S NOTE 03/10/18. POST OP INSTRUCTIONS ALSO IN TELEPHONE NOTE FROM 03/10/18 IN CARE EVERYWHERE AND ON CHART

## 2018-03-14 MED ORDER — CEFAZOLIN SODIUM-DEXTROSE 2-4 GM/100ML-% IV SOLN
2.0000 g | INTRAVENOUS | Status: AC
  Administered 2018-03-15: 2 g via INTRAVENOUS

## 2018-03-15 ENCOUNTER — Observation Stay
Admission: RE | Admit: 2018-03-15 | Discharge: 2018-03-17 | Disposition: A | Discharge status: Home / Self Care | Payer: Medicare HMO | Attending: Surgery | Admitting: Surgery

## 2018-03-15 ENCOUNTER — Encounter
Admission: RE | Disposition: A | Discharge status: Home / Self Care | Payer: Self-pay | Source: Home / Self Care | Attending: Surgery

## 2018-03-15 ENCOUNTER — Ambulatory Visit: Payer: Medicare HMO | Admitting: Anesthesiology

## 2018-03-15 ENCOUNTER — Encounter: Payer: Self-pay | Admitting: *Deleted

## 2018-03-15 ENCOUNTER — Other Ambulatory Visit: Payer: Self-pay

## 2018-03-15 DIAGNOSIS — K439 Ventral hernia without obstruction or gangrene: Secondary | ICD-10-CM

## 2018-03-15 DIAGNOSIS — Z8541 Personal history of malignant neoplasm of cervix uteri: Secondary | ICD-10-CM | POA: Diagnosis not present

## 2018-03-15 DIAGNOSIS — I10 Essential (primary) hypertension: Secondary | ICD-10-CM | POA: Insufficient documentation

## 2018-03-15 DIAGNOSIS — K7581 Nonalcoholic steatohepatitis (NASH): Secondary | ICD-10-CM | POA: Insufficient documentation

## 2018-03-15 DIAGNOSIS — Z794 Long term (current) use of insulin: Secondary | ICD-10-CM | POA: Diagnosis not present

## 2018-03-15 DIAGNOSIS — K219 Gastro-esophageal reflux disease without esophagitis: Secondary | ICD-10-CM | POA: Diagnosis not present

## 2018-03-15 DIAGNOSIS — Z90710 Acquired absence of both cervix and uterus: Secondary | ICD-10-CM | POA: Diagnosis not present

## 2018-03-15 DIAGNOSIS — Z886 Allergy status to analgesic agent status: Secondary | ICD-10-CM | POA: Insufficient documentation

## 2018-03-15 DIAGNOSIS — Z882 Allergy status to sulfonamides status: Secondary | ICD-10-CM | POA: Insufficient documentation

## 2018-03-15 DIAGNOSIS — K861 Other chronic pancreatitis: Secondary | ICD-10-CM | POA: Insufficient documentation

## 2018-03-15 DIAGNOSIS — K589 Irritable bowel syndrome without diarrhea: Secondary | ICD-10-CM | POA: Insufficient documentation

## 2018-03-15 DIAGNOSIS — Z888 Allergy status to other drugs, medicaments and biological substances status: Secondary | ICD-10-CM | POA: Insufficient documentation

## 2018-03-15 DIAGNOSIS — F319 Bipolar disorder, unspecified: Secondary | ICD-10-CM | POA: Insufficient documentation

## 2018-03-15 DIAGNOSIS — E271 Primary adrenocortical insufficiency: Secondary | ICD-10-CM | POA: Diagnosis not present

## 2018-03-15 DIAGNOSIS — J449 Chronic obstructive pulmonary disease, unspecified: Secondary | ICD-10-CM | POA: Diagnosis not present

## 2018-03-15 DIAGNOSIS — Z881 Allergy status to other antibiotic agents status: Secondary | ICD-10-CM | POA: Insufficient documentation

## 2018-03-15 DIAGNOSIS — E119 Type 2 diabetes mellitus without complications: Secondary | ICD-10-CM | POA: Diagnosis not present

## 2018-03-15 DIAGNOSIS — K432 Incisional hernia without obstruction or gangrene: Secondary | ICD-10-CM | POA: Diagnosis not present

## 2018-03-15 DIAGNOSIS — Z8719 Personal history of other diseases of the digestive system: Secondary | ICD-10-CM

## 2018-03-15 DIAGNOSIS — Z885 Allergy status to narcotic agent status: Secondary | ICD-10-CM | POA: Insufficient documentation

## 2018-03-15 DIAGNOSIS — Z79899 Other long term (current) drug therapy: Secondary | ICD-10-CM | POA: Insufficient documentation

## 2018-03-15 DIAGNOSIS — K7460 Unspecified cirrhosis of liver: Secondary | ICD-10-CM | POA: Insufficient documentation

## 2018-03-15 DIAGNOSIS — Z9641 Presence of insulin pump (external) (internal): Secondary | ICD-10-CM | POA: Insufficient documentation

## 2018-03-15 DIAGNOSIS — Z9889 Other specified postprocedural states: Secondary | ICD-10-CM

## 2018-03-15 HISTORY — PX: ROBOTIC ASSISTED LAPAROSCOPIC VENTRAL/INCISIONAL HERNIA REPAIR: SHX6607

## 2018-03-15 LAB — GLUCOSE, CAPILLARY
Glucose-Capillary: 188 mg/dL — ABNORMAL HIGH (ref 70–99)
Glucose-Capillary: 329 mg/dL — ABNORMAL HIGH (ref 70–99)
Glucose-Capillary: 306 mg/dL — ABNORMAL HIGH (ref 70–99)
Glucose-Capillary: 287 mg/dL — ABNORMAL HIGH (ref 70–99)
Glucose-Capillary: 290 mg/dL — ABNORMAL HIGH (ref 70–99)
Glucose-Capillary: 219 mg/dL — ABNORMAL HIGH (ref 70–99)
Glucose-Capillary: 300 mg/dL — ABNORMAL HIGH (ref 70–99)

## 2018-03-15 LAB — SURGICAL PCR SCREEN
MRSA, PCR: NEGATIVE
Staphylococcus aureus: NEGATIVE

## 2018-03-15 SURGERY — ROBOTIC ASSISTED LAPAROSCOPIC VENTRAL/INCISIONAL HERNIA REPAIR
Anesthesia: General

## 2018-03-15 MED ORDER — ROCURONIUM BROMIDE 50 MG/5ML IV SOLN
INTRAVENOUS | Status: AC
  Filled 2018-03-15: qty 1

## 2018-03-15 MED ORDER — FENTANYL CITRATE (PF) 100 MCG/2ML IJ SOLN
INTRAMUSCULAR | Status: AC
  Administered 2018-03-15: 25 ug via INTRAVENOUS
  Filled 2018-03-15: qty 2

## 2018-03-15 MED ORDER — INSULIN GLARGINE 100 UNIT/ML ~~LOC~~ SOLN
12.0000 [IU] | Freq: Every day | SUBCUTANEOUS | Status: DC
  Administered 2018-03-15 – 2018-03-17 (×3): 12 [IU] via SUBCUTANEOUS
  Filled 2018-03-15 (×5): qty 0.12

## 2018-03-15 MED ORDER — FENTANYL CITRATE (PF) 250 MCG/5ML IJ SOLN
INTRAMUSCULAR | Status: AC
  Filled 2018-03-15: qty 5

## 2018-03-15 MED ORDER — OXYCODONE HCL 5 MG PO TABS
10.0000 mg | ORAL_TABLET | ORAL | Status: DC | PRN
  Administered 2018-03-15 – 2018-03-17 (×8): 10 mg via ORAL
  Filled 2018-03-15 (×8): qty 2

## 2018-03-15 MED ORDER — MIDAZOLAM HCL 2 MG/2ML IJ SOLN
INTRAMUSCULAR | Status: AC
  Filled 2018-03-15: qty 2

## 2018-03-15 MED ORDER — DEXAMETHASONE SODIUM PHOSPHATE 10 MG/ML IJ SOLN
INTRAMUSCULAR | Status: DC | PRN
  Administered 2018-03-15: 10 mg via INTRAVENOUS

## 2018-03-15 MED ORDER — SUGAMMADEX SODIUM 200 MG/2ML IV SOLN
INTRAVENOUS | Status: AC
  Filled 2018-03-15: qty 2

## 2018-03-15 MED ORDER — PHENYLEPHRINE HCL 10 MG/ML IJ SOLN
INTRAMUSCULAR | Status: DC | PRN
  Administered 2018-03-15: 100 ug via INTRAVENOUS

## 2018-03-15 MED ORDER — CEFAZOLIN SODIUM-DEXTROSE 2-4 GM/100ML-% IV SOLN
INTRAVENOUS | Status: AC
  Filled 2018-03-15: qty 100

## 2018-03-15 MED ORDER — GABAPENTIN 300 MG PO CAPS: 300.0000 mg | ORAL_CAPSULE | ORAL | Status: DC

## 2018-03-15 MED ORDER — HEPARIN SODIUM (PORCINE) 5000 UNIT/ML IJ SOLN
5000.0000 [IU] | Freq: Three times a day (TID) | INTRAMUSCULAR | Status: DC
  Administered 2018-03-15 – 2018-03-17 (×6): 5000 [IU] via SUBCUTANEOUS
  Filled 2018-03-15 (×6): qty 1

## 2018-03-15 MED ORDER — ACETAMINOPHEN 500 MG PO TABS
1000.0000 mg | ORAL_TABLET | ORAL | Status: AC
  Administered 2018-03-15: 1000 mg via ORAL

## 2018-03-15 MED ORDER — PROMETHAZINE HCL 25 MG/ML IJ SOLN: 25.0000 mg | Freq: Four times a day (QID) | INTRAMUSCULAR | Status: DC | PRN

## 2018-03-15 MED ORDER — INSULIN ASPART 100 UNIT/ML ~~LOC~~ SOLN
0.0000 [IU] | Freq: Three times a day (TID) | SUBCUTANEOUS | Status: DC
  Administered 2018-03-15 – 2018-03-16 (×2): 3 [IU] via SUBCUTANEOUS
  Administered 2018-03-16: 5 [IU] via SUBCUTANEOUS
  Administered 2018-03-16: 1 [IU] via SUBCUTANEOUS
  Administered 2018-03-17: 3 [IU] via SUBCUTANEOUS
  Administered 2018-03-17: 2 [IU] via SUBCUTANEOUS
  Filled 2018-03-15 (×5): qty 1

## 2018-03-15 MED ORDER — FENTANYL CITRATE (PF) 100 MCG/2ML IJ SOLN
INTRAMUSCULAR | Status: DC | PRN
  Administered 2018-03-15: 50 ug via INTRAVENOUS
  Administered 2018-03-15 (×2): 100 ug via INTRAVENOUS

## 2018-03-15 MED ORDER — PROMETHAZINE HCL 25 MG PO TABS
12.5000 mg | ORAL_TABLET | Freq: Four times a day (QID) | ORAL | Status: DC | PRN
  Filled 2018-03-15: qty 1

## 2018-03-15 MED ORDER — SODIUM CHLORIDE FLUSH 0.9 % IV SOLN
INTRAVENOUS | Status: AC
  Administered 2018-03-15: 10 mL
  Filled 2018-03-15: qty 10

## 2018-03-15 MED ORDER — SODIUM CHLORIDE 0.9% FLUSH
10.0000 mL | Freq: Once | INTRAVENOUS | Status: AC
  Administered 2018-03-15: 10 mL

## 2018-03-15 MED ORDER — PROPOFOL 10 MG/ML IV BOLUS
INTRAVENOUS | Status: DC | PRN
  Administered 2018-03-15: 150 mg via INTRAVENOUS

## 2018-03-15 MED ORDER — DEXMEDETOMIDINE HCL 200 MCG/2ML IV SOLN
INTRAVENOUS | Status: DC | PRN
  Administered 2018-03-15: 12 ug via INTRAVENOUS

## 2018-03-15 MED ORDER — SUCCINYLCHOLINE CHLORIDE 20 MG/ML IJ SOLN
INTRAMUSCULAR | Status: DC | PRN
  Administered 2018-03-15: 100 mg via INTRAVENOUS

## 2018-03-15 MED ORDER — BUPIVACAINE LIPOSOME 1.3 % IJ SUSP
INTRAMUSCULAR | Status: DC | PRN
  Administered 2018-03-15: 20 mL

## 2018-03-15 MED ORDER — INSULIN ASPART 100 UNIT/ML ~~LOC~~ SOLN
5.0000 [IU] | Freq: Once | SUBCUTANEOUS | Status: AC
  Administered 2018-03-15: 5 [IU] via SUBCUTANEOUS

## 2018-03-15 MED ORDER — DEXAMETHASONE SODIUM PHOSPHATE 10 MG/ML IJ SOLN
INTRAMUSCULAR | Status: AC
  Filled 2018-03-15: qty 1

## 2018-03-15 MED ORDER — GABAPENTIN 300 MG PO CAPS
300.0000 mg | ORAL_CAPSULE | Freq: Three times a day (TID) | ORAL | Status: DC
  Administered 2018-03-15 – 2018-03-16 (×3): 300 mg via ORAL
  Filled 2018-03-15 (×3): qty 1

## 2018-03-15 MED ORDER — INSULIN ASPART 100 UNIT/ML ~~LOC~~ SOLN
SUBCUTANEOUS | Status: AC
  Administered 2018-03-15: 5 [IU] via SUBCUTANEOUS
  Filled 2018-03-15: qty 1

## 2018-03-15 MED ORDER — PROMETHAZINE HCL 25 MG/ML IJ SOLN
12.5000 mg | Freq: Four times a day (QID) | INTRAMUSCULAR | Status: DC | PRN
  Administered 2018-03-15 – 2018-03-17 (×2): 12.5 mg via INTRAVENOUS
  Filled 2018-03-15 (×2): qty 1

## 2018-03-15 MED ORDER — PANTOPRAZOLE SODIUM 40 MG PO TBEC
40.0000 mg | DELAYED_RELEASE_TABLET | Freq: Every day | ORAL | Status: DC
  Administered 2018-03-16 – 2018-03-17 (×2): 40 mg via ORAL
  Filled 2018-03-15 (×2): qty 1

## 2018-03-15 MED ORDER — ONDANSETRON HCL 4 MG/2ML IJ SOLN
INTRAMUSCULAR | Status: AC
  Filled 2018-03-15: qty 2

## 2018-03-15 MED ORDER — HYDROCHLOROTHIAZIDE 25 MG PO TABS
12.5000 mg | ORAL_TABLET | Freq: Every day | ORAL | Status: DC | PRN
  Administered 2018-03-16: 12.5 mg via ORAL
  Filled 2018-03-15: qty 1

## 2018-03-15 MED ORDER — INSULIN ASPART 100 UNIT/ML ~~LOC~~ SOLN
SUBCUTANEOUS | Status: AC
  Administered 2018-03-15: 6 [IU] via SUBCUTANEOUS
  Filled 2018-03-15: qty 1

## 2018-03-15 MED ORDER — LABETALOL HCL 5 MG/ML IV SOLN
INTRAVENOUS | Status: DC | PRN
  Administered 2018-03-15 (×2): 5 mg via INTRAVENOUS

## 2018-03-15 MED ORDER — LIDOCAINE 2% (20 MG/ML) 5 ML SYRINGE
INTRAMUSCULAR | Status: DC | PRN
  Administered 2018-03-15: 80 mg via INTRAVENOUS

## 2018-03-15 MED ORDER — FENTANYL CITRATE (PF) 100 MCG/2ML IJ SOLN
50.0000 ug | Freq: Once | INTRAMUSCULAR | Status: AC
  Administered 2018-03-15: 50 ug via INTRAVENOUS

## 2018-03-15 MED ORDER — PANCRELIPASE (LIP-PROT-AMYL) 12000-38000 UNITS PO CPEP
36000.0000 [IU] | ORAL_CAPSULE | Freq: Three times a day (TID) | ORAL | Status: DC
  Administered 2018-03-15 – 2018-03-17 (×6): 36000 [IU] via ORAL
  Filled 2018-03-15 (×9): qty 3

## 2018-03-15 MED ORDER — ACETAMINOPHEN 500 MG PO TABS
ORAL_TABLET | ORAL | Status: AC
  Administered 2018-03-15: 1000 mg via ORAL
  Filled 2018-03-15: qty 2

## 2018-03-15 MED ORDER — BUPIVACAINE-EPINEPHRINE 0.25% -1:200000 IJ SOLN
INTRAMUSCULAR | Status: DC | PRN
  Administered 2018-03-15: 30 mL

## 2018-03-15 MED ORDER — FENTANYL CITRATE (PF) 100 MCG/2ML IJ SOLN
12.5000 ug | INTRAMUSCULAR | Status: DC | PRN
  Administered 2018-03-15 – 2018-03-16 (×5): 12.5 ug via INTRAVENOUS
  Filled 2018-03-15 (×5): qty 2

## 2018-03-15 MED ORDER — ACETAMINOPHEN 500 MG PO TABS
1000.0000 mg | ORAL_TABLET | Freq: Four times a day (QID) | ORAL | Status: DC
  Administered 2018-03-15 – 2018-03-17 (×8): 1000 mg via ORAL
  Filled 2018-03-15 (×8): qty 2

## 2018-03-15 MED ORDER — SCOPOLAMINE 1 MG/3DAYS TD PT72
MEDICATED_PATCH | TRANSDERMAL | Status: AC
  Administered 2018-03-15: 1.5 mg via TRANSDERMAL
  Filled 2018-03-15: qty 1

## 2018-03-15 MED ORDER — BUPIVACAINE LIPOSOME 1.3 % IJ SUSP
INTRAMUSCULAR | Status: AC
  Filled 2018-03-15: qty 20

## 2018-03-15 MED ORDER — CHLORHEXIDINE GLUCONATE CLOTH 2 % EX PADS: 6.0000 | MEDICATED_PAD | Freq: Once | CUTANEOUS | Status: DC

## 2018-03-15 MED ORDER — FENTANYL CITRATE (PF) 100 MCG/2ML IJ SOLN
25.0000 ug | INTRAMUSCULAR | Status: AC | PRN
  Administered 2018-03-15 (×6): 25 ug via INTRAVENOUS

## 2018-03-15 MED ORDER — INSULIN ASPART 100 UNIT/ML ~~LOC~~ SOLN
6.0000 [IU] | Freq: Once | SUBCUTANEOUS | Status: AC
  Administered 2018-03-15: 6 [IU] via SUBCUTANEOUS

## 2018-03-15 MED ORDER — BUPIVACAINE-EPINEPHRINE (PF) 0.25% -1:200000 IJ SOLN
INTRAMUSCULAR | Status: AC
  Filled 2018-03-15: qty 30

## 2018-03-15 MED ORDER — LUBIPROSTONE 24 MCG PO CAPS
24.0000 ug | ORAL_CAPSULE | Freq: Two times a day (BID) | ORAL | Status: DC
  Administered 2018-03-15 – 2018-03-17 (×4): 24 ug via ORAL
  Filled 2018-03-15 (×5): qty 1

## 2018-03-15 MED ORDER — KETOROLAC TROMETHAMINE 30 MG/ML IJ SOLN
INTRAMUSCULAR | Status: DC | PRN
  Administered 2018-03-15: 30 mg via INTRAVENOUS

## 2018-03-15 MED ORDER — SUGAMMADEX SODIUM 200 MG/2ML IV SOLN
INTRAVENOUS | Status: DC | PRN
  Administered 2018-03-15: 134.4 mg via INTRAVENOUS

## 2018-03-15 MED ORDER — PROPOFOL 10 MG/ML IV BOLUS
INTRAVENOUS | Status: AC
  Filled 2018-03-15: qty 20

## 2018-03-15 MED ORDER — SODIUM CHLORIDE 0.9 % IV SOLN
INTRAVENOUS | Status: DC
  Administered 2018-03-15 (×2): via INTRAVENOUS
  Administered 2018-03-15: 100 mL/h via INTRAVENOUS
  Administered 2018-03-15: 15:00:00 via INTRAVENOUS

## 2018-03-15 MED ORDER — ROCURONIUM BROMIDE 100 MG/10ML IV SOLN
INTRAVENOUS | Status: DC | PRN
  Administered 2018-03-15: 10 mg via INTRAVENOUS
  Administered 2018-03-15: 5 mg via INTRAVENOUS
  Administered 2018-03-15 (×2): 10 mg via INTRAVENOUS
  Administered 2018-03-15: 45 mg via INTRAVENOUS

## 2018-03-15 MED ORDER — INSULIN ASPART 100 UNIT/ML ~~LOC~~ SOLN
0.0000 [IU] | Freq: Every day | SUBCUTANEOUS | Status: DC
  Administered 2018-03-15 – 2018-03-16 (×2): 3 [IU] via SUBCUTANEOUS
  Filled 2018-03-15 (×2): qty 1

## 2018-03-15 MED ORDER — MIDAZOLAM HCL 2 MG/2ML IJ SOLN
INTRAMUSCULAR | Status: DC | PRN
  Administered 2018-03-15: 2 mg via INTRAVENOUS

## 2018-03-15 MED ORDER — SCOPOLAMINE 1 MG/3DAYS TD PT72
1.0000 | MEDICATED_PATCH | TRANSDERMAL | Status: DC
  Administered 2018-03-15: 1.5 mg via TRANSDERMAL

## 2018-03-15 MED ORDER — INSULIN ASPART 100 UNIT/ML ~~LOC~~ SOLN
3.0000 [IU] | Freq: Three times a day (TID) | SUBCUTANEOUS | Status: DC
  Administered 2018-03-15 – 2018-03-17 (×6): 3 [IU] via SUBCUTANEOUS
  Filled 2018-03-15 (×5): qty 1

## 2018-03-15 MED ORDER — LIDOCAINE HCL (PF) 2 % IJ SOLN
INTRAMUSCULAR | Status: AC
  Filled 2018-03-15: qty 10

## 2018-03-15 SURGICAL SUPPLY — 42 items
CANISTER SUCT 1200ML W/VALVE (MISCELLANEOUS) ×2 IMPLANT
CANNULA SEALS 8.5MM (CANNULA) ×1
CHLORAPREP W/TINT 26ML (MISCELLANEOUS) ×2 IMPLANT
CORD BIP STRL DISP 12FT (MISCELLANEOUS) ×2 IMPLANT
COVER WAND RF STERILE (DRAPES) ×2 IMPLANT
DEFOGGER SCOPE WARMER CLEARIFY (MISCELLANEOUS) ×2 IMPLANT
DRAPE 3 ARM ACCESS DA VINCI (DRAPES) ×1
DRAPE 3 ARM ACCESS DVNC (DRAPES) ×1 IMPLANT
DRAPE SHEET LG 3/4 BI-LAMINATE (DRAPES) ×4 IMPLANT
ELECT REM PT RETURN 9FT ADLT (ELECTROSURGICAL) ×2
ELECTRODE REM PT RTRN 9FT ADLT (ELECTROSURGICAL) ×1 IMPLANT
GLOVE BIO SURGEON STRL SZ7 (GLOVE) ×8 IMPLANT
GOWN STRL REUS W/ TWL LRG LVL3 (GOWN DISPOSABLE) ×3 IMPLANT
GOWN STRL REUS W/TWL LRG LVL3 (GOWN DISPOSABLE) ×3
GRASPER SUT TROCAR 14GX15 (MISCELLANEOUS) ×2 IMPLANT
IV NS 1000ML (IV SOLUTION) ×1
IV NS 1000ML BAXH (IV SOLUTION) ×1 IMPLANT
KIT PINK PAD W/HEAD ARE REST (MISCELLANEOUS) ×2
KIT PINK PAD W/HEAD ARM REST (MISCELLANEOUS) ×1 IMPLANT
LABEL OR SOLS (LABEL) ×2 IMPLANT
MESH VENT LT ST 10.2X15.2CM EL (Mesh General) ×1 IMPLANT
NEEDLE HYPO 22GX1.5 SAFETY (NEEDLE) ×2 IMPLANT
PACK LAP CHOLECYSTECTOMY (MISCELLANEOUS) ×2 IMPLANT
PROGRASP ENDOWRIST DA VINCI (INSTRUMENTS) ×1
PROGRASP ENDOWRIST DVNC (INSTRUMENTS) ×1 IMPLANT
SEAL CANN 8.5 DVNC (CANNULA) ×1 IMPLANT
SLEEVE ADV FIXATION 5X100MM (TROCAR) ×2 IMPLANT
SOLUTION ELECTROLUBE (MISCELLANEOUS) ×2 IMPLANT
SPONGE LAP 18X18 RF (DISPOSABLE) ×2 IMPLANT
STRAP SAFETY 5IN WIDE (MISCELLANEOUS) ×2 IMPLANT
SUT DVC VLOC 180 0 12IN GS21 (SUTURE) ×4
SUT MNCRL 4-0 (SUTURE) ×1
SUT MNCRL 4-0 27XMFL (SUTURE) ×1
SUT VIC AB 0 CT2 27 (SUTURE) ×2 IMPLANT
SUT VICRYL 0 AB UR-6 (SUTURE) ×4 IMPLANT
SUT VLOC 90 2/L VL 12 GS22 (SUTURE) ×4 IMPLANT
SUTURE DVC VLC 180 0 12IN GS21 (SUTURE) ×2 IMPLANT
SUTURE MNCRL 4-0 27XMF (SUTURE) ×1 IMPLANT
SYR 3ML LL SCALE MARK (SYRINGE) ×2 IMPLANT
TROCAR 130MM GELPORT  DAV (MISCELLANEOUS) ×2 IMPLANT
TROCAR XCEL NON-BLD 5MMX100MML (ENDOMECHANICALS) ×2 IMPLANT
TUBING EVAC SMOKE HEATED PNEUM (TUBING) ×2 IMPLANT

## 2018-03-15 NOTE — Anesthesia Procedure Notes (Signed)
Procedure Name: Intubation Date/Time: 03/15/2018 8:16 AM Performed by: Marsh Dolly, CRNA Pre-anesthesia Checklist: Patient identified, Patient being monitored, Timeout performed, Emergency Drugs available and Suction available Patient Re-evaluated:Patient Re-evaluated prior to induction Oxygen Delivery Method: Circle system utilized Preoxygenation: Pre-oxygenation with 100% oxygen Induction Type: IV induction, Rapid sequence and Cricoid Pressure applied Ventilation: Mask ventilation without difficulty Laryngoscope Size: 3 and Miller Grade View: Grade I Tube type: Oral Tube size: 7.5 mm Number of attempts: 1 Placement Confirmation: ETT inserted through vocal cords under direct vision,  positive ETCO2 and breath sounds checked- equal and bilateral Secured at: 21 cm Tube secured with: Tape Dental Injury: Teeth and Oropharynx as per pre-operative assessment

## 2018-03-15 NOTE — Interval H&P Note (Signed)
History and Physical Interval Note:  03/15/2018 7:08 AM  Amber Christensen  has presented today for surgery, with the diagnosis of VENTRAL HERNIA  The various methods of treatment have been discussed with the patient and family. After consideration of risks, benefits and other options for treatment, the patient has consented to  Procedure(s): ROBOTIC [place] (N/A) as a surgical intervention .  The patient's history has been reviewed, patient examined, no change in status, stable for surgery.  I have reviewed the patient's chart and labs.  Questions were answered to the patient's satisfaction.     [place]

## 2018-03-15 NOTE — Transfer of Care (Signed)
Immediate Anesthesia Transfer of Care Note  Patient: Amber Christensen  Procedure(s) Performed: ROBOTIC ASSISTED LAPAROSCOPIC VENTRAL HERNIA REPAIR (N/A )  Patient Location: PACU  Anesthesia Type:General  Level of Consciousness: awake, alert  and oriented  Airway & Oxygen Therapy: Patient Spontanous Breathing and Patient connected to nasal cannula oxygen  Post-op Assessment: Report given to RN and Post -op Vital signs reviewed and stable  Post vital signs: Reviewed and stable  Last Vitals:  Vitals Value Taken Time  BP 99/56 03/15/2018 11:14 AM  Temp    Pulse 78 03/15/2018 11:16 AM  Resp 12 03/15/2018 11:16 AM  SpO2 100 % 03/15/2018 11:16 AM  Vitals shown include unvalidated device data.  Last Pain:  Vitals:   03/15/18 0620  TempSrc: Tympanic  PainSc: 3       Patients Stated Pain Goal: 1 (46/00/29 8473)  Complications: No apparent anesthesia complications

## 2018-03-15 NOTE — Anesthesia Preprocedure Evaluation (Signed)
Anesthesia Evaluation  Patient identified by MRN, date of birth, ID band Patient awake    Reviewed: Allergy & Precautions, H&P , NPO status , Patient's Chart, lab work & pertinent test results  History of Anesthesia Complications Negative for: history of anesthetic complications  Airway Mallampati: III  TM Distance: >3 FB Neck ROM: limited    Dental  (+) Chipped, Poor Dentition   Pulmonary neg shortness of breath, COPD,           Cardiovascular Exercise Tolerance: Good hypertension, + angina (-) Past MI      Neuro/Psych  Headaches, PSYCHIATRIC DISORDERS TIA Neuromuscular disease    GI/Hepatic GERD  Medicated and Controlled,(+) Hepatitis -  Endo/Other  diabetes, Type 2, Insulin DependentPatient with insulin pump, she reports that she does not know how to use the pump, plan to have patient remove the pump for the procedure   Renal/GU Renal disease  negative genitourinary   Musculoskeletal   Abdominal   Peds  Hematology negative hematology ROS (+)   Anesthesia Other Findings Past Medical History: No date: Allergy No date: Anemia No date: Anxiety No date: 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[place]) No date: Hypertension No date: Irritable bowel syndrome No date: Kidney mass No date: Liver disease No date: NASH (nonalcoholic steatohepatitis) No date: Pancreatitis No date: Thyroid disease  Past Surgical History: No date:  ABDOMINAL HYSTERECTOMY No date: APPENDECTOMY No date: BLADDER SURGERY No date: BOWEL RESECTION 1989: BREAST BIOPSY; Left No date: CARDIAC CATHETERIZATION No date: CHOLECYSTECTOMY 1992: CYST REMOVAL HAND; Left     Comment:  Ganglion cyst removed from Left Wrist No date: CYSTECTOMY No date: DILATATION & CURETTAGE/HYSTEROSCOPY WITH MYOSURE No date: ERCP No date: ESOPHAGOGASTRODUODENOSCOPY 1992: KNEE ARTHROSCOPY; Left No date: LIVER BIOPSY No date: PANCREAS SURGERY 04/10/2017: PORTA CATH INSERTION; N/A     Comment:  Procedure: PORTA CATH INSERTION;  Surgeon: Algernon Huxley,              MD;  Location: [facility];  Service:               Cardiovascular;  Laterality: N/A; 2012: REPAIR OF ESOPHAGUS     Comment:  3 clamps placed in esophagus No date: SMALL BOWEL REPAIR No date: TONSILLECTOMY     Reproductive/Obstetrics negative OB ROS                             Anesthesia Physical  Anesthesia Plan  ASA: III  Anesthesia Plan: General   Post-op Pain Management:    Induction: Intravenous  PONV Risk Score and Plan: Dexamethasone, Ondansetron, Midazolam and Treatment may vary due to age or medical condition  Airway Management Planned: Natural Airway and Nasal Cannula  Additional Equipment:   Intra-op Plan:   Post-operative Plan:   Informed Consent: I have reviewed the patients History and Physical, chart, labs and discussed the procedure including the risks, benefits and alternatives for the proposed anesthesia with the patient or authorized representative who has indicated his/her understanding and acceptance.  Dental Advisory Given  Plan Discussed with: Anesthesiologist, CRNA and Surgeon  Anesthesia Plan Comments: (Patient consented for risks of anesthesia including but not limited to:  - adverse reactions to medications - risk of intubation if required - damage to teeth, lips or other oral mucosa - sore throat or hoarseness -  Damage to heart, brain, lungs or loss of life  Patient voiced understanding.)        Anesthesia Quick Evaluation

## 2018-03-15 NOTE — OR Nursing (Signed)
Saw order to give Lantus 12 units.  We do not have Lantus in our area.  Will pass onto floor to please give when arrives there.

## 2018-03-15 NOTE — Op Note (Addendum)
Robotic assisted laparoscopic ventral hernia Repair IPOM using  15x10 cms ventralight BARD mesh Lysis of adhesions taking aprx 70 min total operative time   Pre-operative Diagnosis: Recurrent Symptomatic ventral hernia   Post-operative Diagnosis: same   Surgeon: Caroleen Hamman, MD FACS   Anesthesia: Gen. with endotracheal tube     Findings: 2 defects midline measuring 2.5 and 1.5 cms respectively and they were 2 cms apart Extensive adhesions from the SB to the abd wall, I had to remove some peritoneum and pieces of the previous fascia that were fused with the bowel from previous operation. No evidence of ascites or cirrhosis   Estimated Blood Loss: 10 cc         Complications: none             Procedure Details  The patient was seen again in the Holding Room. The benefits, complications, treatment options, and expected outcomes were discussed with the patient. The risks of bleeding, infection, recurrence of symptoms, failure to resolve symptoms, bowel injury, mesh placement, mesh infection, any of which could require further surgery were reviewed with the patient. The likelihood of improving the patient's symptoms with return to their baseline status is good.  The patient and/or family concurred with the proposed plan, giving informed consent.  The patient was taken to Operating Room, identified as Thereasa Parkin and the procedure verified.  A Time Out was held and the above information confirmed.   Prior to the induction of general anesthesia, antibiotic prophylaxis was administered. VTE prophylaxis was in place. General endotracheal anesthesia was then administered and tolerated well. After the induction, the abdomen was prepped with Chloraprep and draped in the sterile fashion. The patient was positioned in the supine position.   We used a left upper quadrant subcostal incision and using a cutdown technique were able to identify the fascia both anteriorly and posteriorly elevated incised  and 2 stay sutures were placed.  Hassan trocar inserted and pneumoperitoneum obtained.  No hemodynamic compromise.   2 additional 8 mm ports were placed under direct visualization.  I visualized the hernia and there was two separate  ventral hernias measuring approximately 2.5 and 1.5  Cms respectively .  At this time I went ahead and inserted the 10.2 x 15.2 cms mesh with an echo location.   The robot was brought to the surgical field and docked in the standard fashion.  We made sure that all instrumentation was kept under direct vision at all times and there was no collision between the arms.  I scrubbed out and went to the console.   Confirm and measured that the defect .  Marland Kitchen  Using a 0V lock suture we closed the ventral defects primarily.  The PMI was used to pierce the fascia and center the mesh.  Using the mesh and the echo location device we were able to bring the mesh towards the abdominal wall. Falciform was taken down with electrocautery to allow adequate mesh placement. Extensive LOA performed, I used meticulous dissection and the magnification of the robot allowed to performed a very safe operation. There was an area of the bowel that was fused to the abdominal wall. In order to prevent bowel injuries I took down peritoneum and within a small piece of old mesh that was all fused together.  The mesh was secured circumferentially to the abdominal wall using 2 OV lock in the standard fashion.   The mesh layed really nicely against the  abdominal wall. We had about 4  cms of mesh overlap circumferentially. A second look laparoscopy revealed no evidence of intra-abdominal injuries   All the needles and foreign objects were removed under direct visualization.  The instruments were removed and the robot was undocked.  I scrubbed back in, The laparoscopic ports were removed under direct visualization and the pneumoperitoneum was deflated.   Incisions were closed with  4-0 Monocryl  And the fascial  sutures approximated in the standard fashion Dermabond was used to coat the skin. Liposomal Marcaine  was used to inject all the incision sites. Patient tolerated procedure well and there were no immediate complications. Needle and laparotomy counts were correct    Caroleen Hamman, MD, FACS

## 2018-03-15 NOTE — OR Nursing (Signed)
Spoke with Dr. Dahlia Byes and Diabetes coorindator, Lelan Pons.  Lelan Pons will put in the recommended orders from Dr. Gabriel Carina for her diabetes control.

## 2018-03-15 NOTE — Progress Notes (Addendum)
Inpatient Diabetes Program Recommendations  AACE/ADA: New Consensus Statement on Inpatient Glycemic Control   Target Ranges:  Prepandial:   less than 140 mg/dL      Peak postprandial:   less than 180 mg/dL (1-2 hours)      Critically ill patients:  140 - 180 mg/dL   Results for Amber Christensen, Amber Christensen (MRN [id number]) as of 03/15/2018 11:56  Ref. Range 03/15/2018 06:23 03/15/2018 11:21  Glucose-Capillary Latest Ref Range: 70 - 99 mg/dL 188 (H) 329 (H)   Review of Glycemic Control  Diabetes history: DM (pt had partial pancreectomy)  Outpatient Diabetes medications: Medtronic 630 G insulin pump with Novolog Current orders for Inpatient glycemic control: None  Inpatient Diabetes Program Recommendations:  Insulin Regimen while inpatient: In reviewing chart noted telephone note by Dr. Gabriel Carina on 03/09/18, Dr. Gabriel Carina recommended ordering Lantus 12 units daily, Novolog 3 units TID with meals for meal coverage, and Novolog correction scale while inpatient.  Therefore, please use Glycemic Control order set to order Lantus 12 units daily (to start now since patient removed insulin pump around 7:30 am today), Novolog 0-9 units Q4H, and Novolog 3 units TID with meals for meal coverage if patient eats at least 50% of meals.  Consult: Dr. Dahlia Byes may want to consider ordering consult for hospitalist to assist with DM management while inpatient.  NOTE: Received page from [place], [place] regarding DM control. Patient uses an insulin pump for DM control and it was removed around 7:30 am today prior to surgery. Noted patient received Decadron 10 mg at 10:40 which is contributing to hyperglycemia. Patient has been given Novolog 6 units at 11:31 due to glucose up to 329 mg/dl.  Per office note by Dr. Gabriel Carina on 02/13/18, the following should be patient's insulin pump settings: Basal insulin  12A 0.525 units/hour 2P 0.75 units/hour Total daily basal insulin: 14.85 units/24 hours  Carb Coverage 1:15 1 unit for every 15 grams of  carbohydrates  Insulin Sensitivity 1:80 1 unit drops blood glucose 80 mg/dl  Target Glucose Goals 12A 100-110 mg/dl  Asked that Amber Chen, RN discuss recommendations with Dr. Dahlia Byes. Diabetes Coordinator will follow along while inpatient.  Addendum 03/15/18@14 :00-Spoke with patient regarding DM control and insulin pump.  Patient confirms that her insulin pump was removed this morning prior to surgery. Patient is aware of Dr. Joycie Peek recommendations for SQ insulin while inpatient and insulin pump off.  Patient has insulin pump in her room in her belongings bag. Patient provided Medtronic insulin pump so that pump settings could be confirmed. Insulin pump settings are exactly as noted above. Patient returned her insulin pump back to her belongings bag.  Informed patient that she received Decadron 10 mg this morning which is likely to contribute to hyperglycemia.   Discussed that Lantus 12 units would be given by RN within the next 30 minutes. Discussed that if insulin pump is resumed tomorrow, then we would need to wait until 24 hours after the last dose of Lantus was given to resume basal insulin rates.  Patient states that if she is discharged home tomorrow, it will likely be after 4pm before she could leave since her husband will be at work and not able to pick her up until after he gets off.  Patient verbalized understanding of information discussed and she notes that she has no questions at this time.  Thanks, Amber Alderman, RN, MSN, CDE Diabetes Coordinator Inpatient Diabetes Program [phone] (Team Pager from 8am to 5pm)

## 2018-03-15 NOTE — Anesthesia Post-op Follow-up Note (Signed)
Anesthesia QCDR form completed.        

## 2018-03-16 DIAGNOSIS — Z9641 Presence of insulin pump (external) (internal): Secondary | ICD-10-CM | POA: Diagnosis not present

## 2018-03-16 DIAGNOSIS — F319 Bipolar disorder, unspecified: Secondary | ICD-10-CM | POA: Diagnosis not present

## 2018-03-16 DIAGNOSIS — E119 Type 2 diabetes mellitus without complications: Secondary | ICD-10-CM | POA: Diagnosis not present

## 2018-03-16 DIAGNOSIS — Z794 Long term (current) use of insulin: Secondary | ICD-10-CM | POA: Diagnosis not present

## 2018-03-16 DIAGNOSIS — E271 Primary adrenocortical insufficiency: Secondary | ICD-10-CM | POA: Diagnosis not present

## 2018-03-16 DIAGNOSIS — K432 Incisional hernia without obstruction or gangrene: Secondary | ICD-10-CM | POA: Diagnosis not present

## 2018-03-16 DIAGNOSIS — K7581 Nonalcoholic steatohepatitis (NASH): Secondary | ICD-10-CM | POA: Diagnosis not present

## 2018-03-16 DIAGNOSIS — I10 Essential (primary) hypertension: Secondary | ICD-10-CM | POA: Diagnosis not present

## 2018-03-16 LAB — GLUCOSE, CAPILLARY
Glucose-Capillary: 288 mg/dL — ABNORMAL HIGH (ref 70–99)
Glucose-Capillary: 270 mg/dL — ABNORMAL HIGH (ref 70–99)
Glucose-Capillary: 139 mg/dL — ABNORMAL HIGH (ref 70–99)
Glucose-Capillary: 203 mg/dL — ABNORMAL HIGH (ref 70–99)
Glucose-Capillary: 268 mg/dL — ABNORMAL HIGH (ref 70–99)

## 2018-03-16 LAB — MAGNESIUM: Magnesium: 1.5 mg/dL — ABNORMAL LOW (ref 1.7–2.4)

## 2018-03-16 MED ORDER — MAGNESIUM SULFATE 2 GM/50ML IV SOLN
2.0000 g | Freq: Once | INTRAVENOUS | Status: AC
  Administered 2018-03-16: 2 g via INTRAVENOUS
  Filled 2018-03-16: qty 50

## 2018-03-16 MED ORDER — GABAPENTIN 600 MG PO TABS
300.0000 mg | ORAL_TABLET | Freq: Once | ORAL | Status: AC
  Administered 2018-03-16: 300 mg via ORAL
  Filled 2018-03-16: qty 1

## 2018-03-16 MED ORDER — GABAPENTIN 300 MG PO CAPS
600.0000 mg | ORAL_CAPSULE | Freq: Three times a day (TID) | ORAL | Status: DC
  Administered 2018-03-16 – 2018-03-17 (×4): 600 mg via ORAL
  Filled 2018-03-16 (×4): qty 2

## 2018-03-16 MED ORDER — HEPARIN SOD (PORK) LOCK FLUSH 100 UNIT/ML IV SOLN
500.0000 [IU] | INTRAVENOUS | Status: AC | PRN
  Administered 2018-03-17: 500 [IU]
  Filled 2018-03-16: qty 5

## 2018-03-16 MED ORDER — SODIUM CHLORIDE 0.9 % IV SOLN
INTRAVENOUS | Status: DC | PRN
  Administered 2018-03-16: 20 mL via INTRAVENOUS

## 2018-03-16 MED ORDER — OXYCODONE HCL 10 MG PO TABS: 10.0000 mg | ORAL_TABLET | Freq: Four times a day (QID) | ORAL | 0 refills | Status: DC | PRN

## 2018-03-16 MED ORDER — KETOROLAC TROMETHAMINE 30 MG/ML IJ SOLN
30.0000 mg | Freq: Four times a day (QID) | INTRAMUSCULAR | Status: DC
  Administered 2018-03-16 – 2018-03-17 (×4): 30 mg via INTRAVENOUS
  Filled 2018-03-16 (×4): qty 1

## 2018-03-16 MED ORDER — GABAPENTIN 300 MG PO CAPS: 600.0000 mg | ORAL_CAPSULE | Freq: Three times a day (TID) | ORAL | 0 refills | Status: DC

## 2018-03-16 MED ORDER — DIPHENHYDRAMINE HCL 25 MG PO CAPS
25.0000 mg | ORAL_CAPSULE | Freq: Four times a day (QID) | ORAL | Status: DC | PRN
  Administered 2018-03-16 (×2): 25 mg via ORAL
  Filled 2018-03-16 (×2): qty 1

## 2018-03-16 NOTE — Progress Notes (Signed)
[facility] Hospital Day(s): 0.   Post op day(s): 1 Day Post-Op.   Interval History: Patient seen and examined, no acute events or new complaints overnight. Patient reports that she has abdominal pain described as a soreness on her right side which is fairly uncomfortable for her. She reports this kept her up all night. She denied any fever, chills, nausea, or emesis. She does endorse flatus but no BM. She is mobilizing around her room. She was able to tolerate a diet.  Review of Systems:  Constitutional: denies fever, chills  Respiratory: denies any shortness of breath  Cardiovascular: denies chest pain or palpitations  Gastrointestinal: + abdominal pain, N/V, or diarrhea/and bowel function as per interval history Integumentary: denies any other rashes or skin discolorations except laparoscopic incisions   Vital signs in last 24 hours: [min-max] current  Temp:  [97 F (36.1 C)-99 F (37.2 C)] 98.8 F (37.1 C) (01/31 0355) Pulse Rate:  [76-97] 93 (01/31 0355) Resp:  [11-29] 20 (01/31 0355) BP: (99-124)/(56-89) 122/78 (01/31 0355) SpO2:  [95 %-100 %] 98 % (01/31 0355)     Height: 5' 4"  (162.6 cm) Weight: 67.2 kg BMI (Calculated): 25.42   Intake/Output this shift:  No intake/output data recorded.   Intake/Output last 2 shifts:  @IOLAST2SHIFTS @   Physical Exam:  Constitutional: alert, cooperative and no distress  Respiratory: breathing non-labored at rest  Cardiovascular: regular rate and sinus rhythm  Gastrointestinal: soft, right sided diffuse abdominal tenderness, and non-distended. No rebound or rigidity, healed previous midline incision Integumentary: Laparoscopic incisions are CDI, no erythema, mild surrounding ecchymosis.   Labs:  CBC Latest Ref Rng & Units 02/19/2018 02/16/2018 11/23/2017  WBC 4.0 - 10.5 K/uL 6.6 8.3 5.0  Hemoglobin 12.0 - 15.0 g/dL 13.5 13.4 11.6(L)  Hematocrit 36.0 - 46.0 % 38.0 38.1 32.1(L)  Platelets 150 -  400 K/uL 216 188 150   CMP Latest Ref Rng & Units 02/19/2018 02/16/2018 01/25/2018  Glucose 70 - 99 mg/dL - 187(H) -  BUN 6 - 20 mg/dL - 10 -  Creatinine 0.44 - 1.00 mg/dL - 0.58 0.80  Sodium 135 - 145 mmol/L - 139 -  Potassium 3.5 - 5.1 mmol/L - 3.1(L) -  Chloride 98 - 111 mmol/L - 101 -  CO2 22 - 32 mmol/L - 27 -  Calcium 8.9 - 10.3 mg/dL - 8.3(L) -  Total Protein 6.5 - 8.1 g/dL 7.3 - -  Total Bilirubin 0.3 - 1.2 mg/dL 4.0(H) - -  Alkaline Phos 38 - 126 U/L 104 - -  AST 15 - 41 U/L 31 - -  ALT 0 - 44 U/L 34 - -    Imaging studies: No new pertinent imaging studies   Assessment/Plan:  53 y.o. female with post-operative abdominal soreness but otherwise overall doing well 1 Day Post-Op s/p robot assisted laparoscopic ventral hernia repair with BARD mesh and Extensive LOA for a recurrent symptomatic ventral hernia   - Regular diet + discontinue IVF  - Pain control appears to be the biggest issue  - Continue to monitor abdominal examination and ongoing bowel function  - encouraged mobilization  - DVT prophylaxis   - Discharge planning: Pain control appears to be the biggest issue. Will continue to reassess. Hopefully home this afternoon/tomorrow    All of the above findings and recommendations were discussed with the patient, and the medical team, and all of patient's questions were answered to her expressed satisfaction.  -- Edison Simon, PA-C [facility] 03/16/2018, 9:24 AM [phone] M-F: 7am - 4pm

## 2018-03-16 NOTE — Discharge Instructions (Signed)
In addition to included general post-operative instructions for ventral hernia repair,  Diet: Resume home diet.   Activity: No heavy lifting >20 pounds (children, pets, laundry, garbage) or strenuous activity until follow-up, but light activity and walking are encouraged. Do not drive or drink alcohol if taking narcotic pain medications.  Wound care: 2 days after surgery (02/01), you may shower/get incision wet with soapy water and pat dry (do not rub incisions), but no baths or submerging incision underwater until follow-up.   Medications: Resume all home medications. For mild to moderate pain: acetaminophen (Tylenol) or ibuprofen/naproxen (if no kidney disease). Combining Tylenol with alcohol can substantially increase your risk of causing liver disease. Narcotic pain medications, if prescribed, can be used for severe pain, though may cause nausea, constipation, and drowsiness. Do not combine Tylenol and Percocet (or similar) within a 6 hour period as Percocet (and similar) contain(s) Tylenol. If you do not need the narcotic pain medication, you do not need to fill the prescription. Do not drive if taking narcotic pain medications.   Call office [phone] / [phone]) at any time if any questions, worsening pain, fevers/chills, bleeding, drainage from incision site, or other concerns.

## 2018-03-16 NOTE — Discharge Summary (Signed)
[facility] SURGICAL DISCHARGE SUMMARY  Patient ID: Amber Christensen MRN: [id number] DOB/AGE: January 10, 1966 53 y.o.  Admit date: 03/15/2018 Discharge date: 03/16/2018  Discharge Diagnoses Recurrent Ventral Hernia  Consultants None  Procedures 03/15/2018:  Robotic assisted laparoscopic ventral hernia repair with BARD mesh and extensive LOA  HPI: Amber Christensen is a 53 y.o. female with a history of recurrent ventral hernia being followed by Dr Dahlia Byes. She presented to [facility] on 01/30 for scheduled ventral hernia repair.  Hospital Course: Informed consent was obtained and documented, and patient underwent uneventful Robotic assisted laparoscopic ventral hernia repair with BARD mesh and extensive LOA (Dr Dahlia Byes, MD, 03/15/2018).  Post-operatively, patient's pain improved and advancement of patient's diet and ambulation were well-tolerated. The remainder of patient's hospital course was essentially unremarkable, and discharge planning was initiated accordingly with patient safely able to be discharged home with appropriate discharge instructions, pain control, and outpatient follow-up after all of her questions were answered to her expressed satisfaction.  Discharge Condition: Good    Allergies as of 03/16/2018      Reactions   Demerol [meperidine] Hives, Other (See Comments)   Pt states that this medication causes cardiac arrest.     Dilaudid [hydromorphone Hcl] Nausea And Vomiting, Other (See Comments)   Pt states that this medication causes cardiac arrest.     Metoclopramide Hives   Breathing issues   Morphine And Related Anaphylaxis, Other (See Comments)   Pt states that this medication causes cardiac arrest   Morpholine Salicylate Nausea And Vomiting, Other (See Comments), Rash   CHF   Isosorbide Nitrate Other (See Comments)   Headache   Sulfa Antibiotics Nausea And Vomiting   Adhesive [tape] Rash   Blisters skin Paper tape is okay   Darvon [propoxyphene] Nausea And  Vomiting, Rash   Flexeril [cyclobenzaprine] Nausea And Vomiting, Rash   Levaquin [levofloxacin In D5w] Nausea And Vomiting, Rash   Naproxen Rash   Ibuprofen and advil are not a problem   Soma [carisoprodol] Nausea And Vomiting, Rash   Zofran [ondansetron Hcl] Nausea And Vomiting, Rash      Medication List    TAKE these medications   ACIDOPHILUS LACTOBACILLUS PO Take 1 capsule by mouth daily.   CALCIUM 600/VITAMIN D3 PO Take 1 tablet by mouth daily.   CREON 36000 UNITS Cpep capsule Generic drug:  lipase/protease/amylase Take 108,000 Units by mouth 3 (three) times daily before meals.   cyanocobalamin 2000 MCG tablet Take 2,000 mcg by mouth daily.   Dexlansoprazole 30 MG capsule Take 30 mg by mouth daily.   gabapentin 300 MG capsule Commonly known as:  NEURONTIN Take 1 capsule (300 mg total) by mouth 3 (three) times daily. What changed:  Another medication with the same name was added. Make sure you understand how and when to take each.   gabapentin 300 MG capsule Commonly known as:  NEURONTIN Take 2 capsules (600 mg total) by mouth 3 (three) times daily. What changed:  You were already taking a medication with the same name, and this prescription was added. Make sure you understand how and when to take each.   hydrochlorothiazide 12.5 MG tablet Commonly known as:  HYDRODIURIL Take 12.5 mg by mouth daily as needed (for fluid).   lisinopril 40 MG tablet Commonly known as:  PRINIVIL,ZESTRIL Take 40 mg by mouth daily.   lubiprostone 24 MCG capsule Commonly known as:  AMITIZA Take 24 mcg by mouth 2 (two) times daily with a meal.   NOVOLOG 100 UNIT/ML injection Generic  drug:  insulin aspart See admin instructions. Uses via insulin pump   Oxycodone HCl 10 MG Tabs Take 1 tablet (10 mg total) by mouth every 6 (six) hours as needed for severe pain or breakthrough pain.   promethazine 12.5 MG tablet Commonly known as:  PHENERGAN Take 12.5 mg by mouth every 6 (six) hours  as needed for nausea or vomiting.   Vitamin D-3 125 MCG (5000 UT) Tabs Take 5,000 Units by mouth daily.   vitamin E 400 UNIT capsule Take 400 Units by mouth daily.        Follow-up Information    Pabon, [place] F, MD. Schedule an appointment as soon as possible for a visit in 2 week(s).   Specialty:  General Surgery Why:  follow up with Dr pabon in 1-2 weeks s/p robotic ventral hernia repair Contact information: [address] [place] [address] [phone]            -- Edison Simon , PA-C [facility]  03/16/2018, 3:18 PM [phone] M-F: 7am - 4pm

## 2018-03-16 NOTE — Progress Notes (Signed)
Inpatient Diabetes Program Recommendations  AACE/ADA: New Consensus Statement on Inpatient Glycemic Control   Target Ranges:  Prepandial:   less than 140 mg/dL      Peak postprandial:   less than 180 mg/dL (1-2 hours)      Critically ill patients:  140 - 180 mg/dL   Results for Amber Christensen, Amber Christensen (MRN [id number]) as of 03/16/2018 13:45  Ref. Range 03/15/2018 06:23 03/15/2018 10:40 03/15/2018 11:21 03/15/2018 12:09 03/15/2018 12:30 03/15/2018 12:51 03/15/2018 14:11 03/15/2018 16:26 03/15/2018 20:29 03/16/2018 02:52 03/16/2018 08:00 03/16/2018 11:45  Glucose-Capillary Latest Ref Range: 70 - 99 mg/dL 188 (H)    Decadron 10 mg 329 (H)  Novolog 6 units 306 (H)   290 (H)  Novolog 5 units 287 (H)     Lantus 12 units 219 (H)  Novolog 6 units 300 (H)  Novolog 3 untis 288 (H) 270 (H)  Novolog 8 units  Lantus 12 units @7 :58 139 (H)  Novolog 4 units   Review of Glycemic Control Diabetes history: DM (pt had partial pancreectomy)  Outpatient Diabetes medications: Medtronic 630 G insulin pump with Novolog Current orders for Inpatient glycemic control: Lantus 12 units daily, Novolog 3 units TID with meals, Novolog 0-9 units TID with meals, Novolog 0-5 units QHS  NOTE: Noted hyperglycemia; likely due to getting Decadron 10 mg on 03/15/18.  Noted patient received Lantus 12 units this morning as ordered. Per chart, MD may discharge patient this evening if pain is under control. Spoke with patient over the phone and reminded her that she received the Lantus 12 units this morning at 7:58 am and she will need to wait for 24 hours after last dose of Lantus given before she resumes her basal insulin rates by her insulin pump.  Patient states she is still unsure whether she will be discharged today or not but she is aware of Lantus working for 24 hours.  Patient verbalized understanding of information discussed and she notes that she has no questions or concerns at this time.  Thanks, Barnie Alderman, RN, MSN,  CDE Diabetes Coordinator Inpatient Diabetes Program [phone] (Team Pager from 8am to 5pm)

## 2018-03-16 NOTE — Progress Notes (Signed)
Pt seen and examined Doing well pain is the only issue She is hungry, no N/V AVSS  PE NAD Abd: soft. Incision c/d/i, no peritonitis or infection  A/p Add NSaid Ice pack mobilize Increase gabapentin dose DC in pm if pain improves

## 2018-03-16 NOTE — Care Management Obs Status (Signed)
[facility] NOTIFICATION   Patient Details  Name: Amber Christensen MRN: [id number] Date of Birth: September 06, 1965   Medicare Observation Status Notification Given:  Yes    Xylon Croom Jen Mow, RN 03/16/2018, 4:21 PM

## 2018-03-17 DIAGNOSIS — E271 Primary adrenocortical insufficiency: Secondary | ICD-10-CM | POA: Diagnosis not present

## 2018-03-17 DIAGNOSIS — Z9641 Presence of insulin pump (external) (internal): Secondary | ICD-10-CM | POA: Diagnosis not present

## 2018-03-17 DIAGNOSIS — K432 Incisional hernia without obstruction or gangrene: Secondary | ICD-10-CM | POA: Diagnosis not present

## 2018-03-17 DIAGNOSIS — K7581 Nonalcoholic steatohepatitis (NASH): Secondary | ICD-10-CM | POA: Diagnosis not present

## 2018-03-17 DIAGNOSIS — Z794 Long term (current) use of insulin: Secondary | ICD-10-CM | POA: Diagnosis not present

## 2018-03-17 DIAGNOSIS — E119 Type 2 diabetes mellitus without complications: Secondary | ICD-10-CM | POA: Diagnosis not present

## 2018-03-17 DIAGNOSIS — I10 Essential (primary) hypertension: Secondary | ICD-10-CM | POA: Diagnosis not present

## 2018-03-17 DIAGNOSIS — F319 Bipolar disorder, unspecified: Secondary | ICD-10-CM | POA: Diagnosis not present

## 2018-03-17 LAB — GLUCOSE, CAPILLARY
Glucose-Capillary: 169 mg/dL — ABNORMAL HIGH (ref 70–99)
Glucose-Capillary: 207 mg/dL — ABNORMAL HIGH (ref 70–99)

## 2018-03-17 NOTE — Progress Notes (Signed)
Discharge order received. Patient is alert and oriented. Vital signs stable . No signs of acute distress. Discharge instructions given. Patient verbalized understanding. No other issues noted at this time.   

## 2018-03-19 NOTE — Anesthesia Postprocedure Evaluation (Signed)
Anesthesia Post Note  Patient: Amber Christensen  Procedure(s) Performed: ROBOTIC ASSISTED LAPAROSCOPIC VENTRAL HERNIA REPAIR (N/A )  Patient location during evaluation: PACU Anesthesia Type: General Level of consciousness: awake and alert and oriented Pain management: pain level controlled Vital Signs Assessment: post-procedure vital signs reviewed and stable Respiratory status: spontaneous breathing Cardiovascular status: blood pressure returned to baseline Anesthetic complications: no     Last Vitals:  Vitals:   03/16/18 2001 03/17/18 0505  BP: 113/64 120/77  Pulse: 98 86  Resp: 20 20  Temp: 36.9 C 37 C  SpO2: 95% 96%    Last Pain:  Vitals:   03/17/18 0505  TempSrc: Oral  PainSc:                  Aunesti Pellegrino

## 2018-03-22 ENCOUNTER — Other Ambulatory Visit: Payer: Self-pay

## 2018-03-22 ENCOUNTER — Telehealth: Payer: Self-pay | Admitting: Surgery

## 2018-03-22 ENCOUNTER — Ambulatory Visit (INDEPENDENT_AMBULATORY_CARE_PROVIDER_SITE_OTHER): Payer: 59 | Admitting: Surgery

## 2018-03-22 ENCOUNTER — Encounter: Payer: Self-pay | Admitting: Surgery

## 2018-03-22 VITALS — BP 122/88 | HR 117 | Temp 95.4°F | Ht 64.0 in | Wt 147.0 lb

## 2018-03-22 DIAGNOSIS — Z4889 Encounter for other specified surgical aftercare: Secondary | ICD-10-CM

## 2018-03-22 NOTE — Progress Notes (Signed)
Surgical Clinic Progress/Follow-up Note   HPI:  53 y.o. Female presents to clinic for early post-op follow-up 1 week s/p robotic-assisted laparoscopic lysis of adhesions and repair of ventral hernia with mesh (Pabon, 03/15/2018). Patient was discharged this past Saturday, 2/1, after electing to remain inpatient one day following discharge home due to concern about low magnesium (despite replacement) and chronic vasculitis with which patient was asymptomatic to both on day of discharge. Nonetheless, patient says she continued since discharge to experience focal RLQ abdominal pain at the site of a focal bruise following heparin/Lovenox injection away from her otherwise well-healing non-red non-draining and comparatively non-tender to palpation incision and former lower midline hernia sites. Patient otherwise reports she has been tolerating regular diet with +flatus and normal BM's, denies N/V, fever/chills, CP, or SOB.  Review of Systems:  Constitutional: denies fever/chills  Respiratory: denies shortness of breath, wheezing  Cardiovascular: denies chest pain, palpitations  Gastrointestinal: abdominal pain, N/V, and bowel function as per interval history Skin: Denies any other rashes or skin discolorations except post-surgical wounds as per interval history  Vital Signs:  BP 122/88   Pulse (!) 117   Temp (!) 95.4 F (35.2 C) (Temporal)   Ht 5' 4"  (1.626 m)   Wt 147 lb (66.7 kg)   SpO2 97%   BMI 25.23 kg/m    Physical Exam:  Constitutional:  -- Normal body habitus  -- Awake, alert, and oriented x3  Pulmonary:  -- No crackles -- Equal breath sounds bilaterally -- Breathing non-labored at rest Cardiovascular:  -- S1, S2 present  -- No pericardial rubs  Gastrointestinal:  -- Soft and non-distended with focal moderate RLQ tenderness to palpation associated with a 2 cm round-appearing ecchymosis, consistent with prior heparin/Lovenox injection site, no guarding/rebound tenderness --  Post-surgical incisions all well-approximated without any peri-incisional erythema or drainage -- No abdominal masses appreciated, pulsatile or otherwise  Musculoskeletal / Integumentary:  -- Wounds or skin discoloration: None appreciated except post-surgical incisions as described above (GI) -- Extremities: B/L UE and LE FROM, hands and feet warm, no edema   Imaging: No new pertinent imaging available for review  Assessment:  53 y.o. yo Female with a problem list including...  Patient Active Problem List   Diagnosis Date Noted  . S/P ventral herniorrhaphy 03/15/2018  . Ventral hernia without obstruction or gangrene   . Hypertension 03/21/2017  . Diabetes mellitus without complication ([place]) 30/16/0109  . Pancreatitis 03/21/2017  . Poor venous access 03/21/2017  . Vasculitis ([place]) 08/03/2016  . Acute maculopapular rash 04/21/2016  . Maculopapular rash, generalized 04/20/2016  . Unstable angina ([place]) 09/28/2015    presents to clinic for early post-op follow-up evaluation, doing overall okay despite focal RLQ non-peri-incisional abdominal pain at site of ecchymosis, most likely attributable to heparin/Lovenox injection site 1 week s/p robotic-assisted laparoscopic lysis of adhesions and repair of ventral hernia with mesh (Pabon, 03/15/2018).  Plan:              - no signs of peri-incisional infection  - nearly finished steroid taper per rheumatologist  - supplement prescribed oxycodone with Tylenol / NSAID             - apply heat/cold prn and sunblock particularly to incisions with sun exposure to reduce pigmentation of scars             - return to clinic for follow-up with Dr. Dahlia Byes as scheduled next week  - instructed to call office if any questions or concerns  -  medical management as per PCP  All of the above recommendations were discussed with the patient, and all of patient's questions were answered to her expressed satisfaction.  -- Marilynne Drivers Rosana Hoes, MD, [place]:  [facility] - Partnering for exceptional care. Office: [phone]

## 2018-03-22 NOTE — Patient Instructions (Addendum)
The patient is aware to call back for any questions or new concerns. Continue steroid taper The patient is aware to use a heating pad as needed for comfort. Follow up as scheduled with Dr Dahlia Byes

## 2018-03-22 NOTE — Telephone Encounter (Signed)
Patient is calling and said she is in a lot of pain, said her pain level is higher than a 10 and the patient has been throwing up, patient said she has had some chills as well.please call patient and advise

## 2018-03-22 NOTE — Telephone Encounter (Signed)
Patient to see Dr. Rosana Hoes today

## 2018-03-24 ENCOUNTER — Encounter: Payer: Self-pay | Admitting: Surgery

## 2018-03-26 ENCOUNTER — Ambulatory Visit
Admission: RE | Admit: 2018-03-26 | Discharge: 2018-03-26 | Disposition: A | Discharge status: Home / Self Care | Payer: Medicare HMO | Source: Ambulatory Visit | Attending: Surgery | Admitting: Surgery

## 2018-03-26 ENCOUNTER — Encounter: Payer: Self-pay | Admitting: *Deleted

## 2018-03-26 ENCOUNTER — Encounter: Payer: Self-pay | Admitting: Surgery

## 2018-03-26 ENCOUNTER — Other Ambulatory Visit
Admission: RE | Admit: 2018-03-26 | Discharge: 2018-03-26 | Disposition: A | Discharge status: Home / Self Care | Payer: Medicare HMO | Source: Ambulatory Visit | Attending: Surgery | Admitting: Surgery

## 2018-03-26 ENCOUNTER — Other Ambulatory Visit: Payer: Self-pay

## 2018-03-26 ENCOUNTER — Ambulatory Visit (INDEPENDENT_AMBULATORY_CARE_PROVIDER_SITE_OTHER): Payer: 59 | Admitting: Surgery

## 2018-03-26 DIAGNOSIS — R1084 Generalized abdominal pain: Secondary | ICD-10-CM

## 2018-03-26 DIAGNOSIS — R112 Nausea with vomiting, unspecified: Secondary | ICD-10-CM | POA: Diagnosis not present

## 2018-03-26 DIAGNOSIS — R1110 Vomiting, unspecified: Secondary | ICD-10-CM | POA: Diagnosis not present

## 2018-03-26 LAB — CBC WITH DIFFERENTIAL/PLATELET
Abs Immature Granulocytes: 0.02 10*3/uL (ref 0.00–0.07)
Basophils Absolute: 0 10*3/uL (ref 0.0–0.1)
Basophils Relative: 0 %
Eosinophils Absolute: 0.1 10*3/uL (ref 0.0–0.5)
Eosinophils Relative: 2 %
HCT: 47.4 % — ABNORMAL HIGH (ref 36.0–46.0)
Hemoglobin: 16.7 g/dL — ABNORMAL HIGH (ref 12.0–15.0)
Immature Granulocytes: 0 %
Lymphocytes Relative: 28 %
Lymphs Abs: 2.3 10*3/uL (ref 0.7–4.0)
MCH: 29.7 pg (ref 26.0–34.0)
MCHC: 35.2 g/dL (ref 30.0–36.0)
MCV: 84.2 fL (ref 80.0–100.0)
Monocytes Absolute: 0.4 10*3/uL (ref 0.1–1.0)
Monocytes Relative: 5 %
Neutro Abs: 5.1 10*3/uL (ref 1.7–7.7)
Neutrophils Relative %: 65 %
Platelets: 222 10*3/uL (ref 150–400)
RBC: 5.63 MIL/uL — ABNORMAL HIGH (ref 3.87–5.11)
RDW: 12.4 % (ref 11.5–15.5)
WBC: 8 10*3/uL (ref 4.0–10.5)
nRBC: 0 % (ref 0.0–0.2)

## 2018-03-26 LAB — URINE DRUG SCREEN, QUALITATIVE (ARMC ONLY)
Amphetamines, Ur Screen: NOT DETECTED
Barbiturates, Ur Screen: NOT DETECTED
Benzodiazepine, Ur Scrn: NOT DETECTED
Cannabinoid 50 Ng, Ur ~~LOC~~: NOT DETECTED
Cocaine Metabolite,Ur ~~LOC~~: NOT DETECTED
MDMA (Ecstasy)Ur Screen: NOT DETECTED
Methadone Scn, Ur: NOT DETECTED
Opiate, Ur Screen: NOT DETECTED
Phencyclidine (PCP) Ur S: NOT DETECTED
Tricyclic, Ur Screen: NOT DETECTED

## 2018-03-26 LAB — URINALYSIS, ROUTINE W REFLEX MICROSCOPIC
Bacteria, UA: NONE SEEN
Bilirubin Urine: NEGATIVE
Glucose, UA: >=500 mg/dL — AB
Hgb urine dipstick: NEGATIVE
Ketones, ur: NEGATIVE mg/dL
Nitrite: NEGATIVE
Protein, ur: NEGATIVE mg/dL
Specific Gravity, Urine: 1.022 (ref 1.005–1.030)
pH: 5 (ref 5.0–8.0)

## 2018-03-26 LAB — COMPREHENSIVE METABOLIC PANEL
ALT: 28 U/L (ref 0–44)
AST: 31 U/L (ref 15–41)
Albumin: 4 g/dL (ref 3.5–5.0)
Alkaline Phosphatase: 113 U/L (ref 38–126)
Anion gap: 11 (ref 5–15)
BUN: 19 mg/dL (ref 6–20)
CO2: 27 mmol/L (ref 22–32)
Calcium: 8.8 mg/dL — ABNORMAL LOW (ref 8.9–10.3)
Chloride: 95 mmol/L — ABNORMAL LOW (ref 98–111)
Creatinine, Ser: 0.7 mg/dL (ref 0.44–1.00)
GFR calc Af Amer: >60 mL/min (ref >60)
GFR calc non Af Amer: >60 mL/min (ref >60)
Glucose, Bld: 376 mg/dL — ABNORMAL HIGH (ref 70–99)
Potassium: 4.7 mmol/L (ref 3.5–5.1)
Sodium: 133 mmol/L — ABNORMAL LOW (ref 135–145)
Total Bilirubin: 2.8 mg/dL — ABNORMAL HIGH (ref 0.3–1.2)
Total Protein: 7.4 g/dL (ref 6.5–8.1)

## 2018-03-26 MED ORDER — HEPARIN SOD (PORK) LOCK FLUSH 100 UNIT/ML IV SOLN
500.0000 [IU] | INTRAVENOUS | Status: AC | PRN
  Administered 2018-03-26: 500 [IU]

## 2018-03-26 MED ORDER — IOPAMIDOL (ISOVUE-300) INJECTION 61%
100.0000 mL | Freq: Once | INTRAVENOUS | Status: AC | PRN
  Administered 2018-03-26: 100 mL via INTRAVENOUS

## 2018-03-26 NOTE — Progress Notes (Signed)
Patient was sent to [facility] today to have the following labs drawn: CBC, CMP, urinalysis, and urine drug screen.   The patient will also be having  a CT abdomen/pelvis with contrast at [facility] STAT. Prep: nothing else to eat/drink. Patient verbalizes understanding.   The patient is aware to check in at the [place] registration desk (1st desk on right) and inform staff that she is there for labs and CT scan for Dr. Dahlia Byes.    *[name] aware to work on pre-cert for CT scan.  Patient will follow up in the office on Wednesday as scheduled- 03-28-18 at 2:15 pm.  The patient verbalizes understanding of the above.

## 2018-03-26 NOTE — Patient Instructions (Signed)
Patient to have CT scan and lab work asap

## 2018-03-27 ENCOUNTER — Ambulatory Visit: Payer: Medicare HMO

## 2018-03-27 ENCOUNTER — Telehealth: Payer: Self-pay | Admitting: *Deleted

## 2018-03-27 ENCOUNTER — Encounter: Payer: Self-pay | Admitting: Surgery

## 2018-03-27 NOTE — Progress Notes (Signed)
S/p rob ventral hernia 1/30 Comes for persistent abd pain. No fevers or chills HR 120 but she just found out she lost er nephew and is very anxious Taking PO  PE NAD Abd: soft, appropriate incisional tenderness.no peritonitis. No infection  A/p abd pain on a pt w chronic pain. Given her tachy I will obtain CT a/p ASAP as well as labs. I will call her w results and will have f/u in 2 days

## 2018-03-27 NOTE — Telephone Encounter (Signed)
-----   Message from Jules Husbands, MD sent at 03/27/2018  8:01 AM EST ----- Please let her know that Her CT and labs were normal. He just needs to keep hydrated with pedialyte. She needs to keep appt w me ----- Message ----- From: Interface, Rad Results In Sent: 03/26/2018   5:29 PM EST To: Jules Husbands, MD

## 2018-03-27 NOTE — Telephone Encounter (Signed)
Notified patient as instructed, patient pleased. Discussed follow-up appointments, patient agrees  

## 2018-03-28 ENCOUNTER — Ambulatory Visit (INDEPENDENT_AMBULATORY_CARE_PROVIDER_SITE_OTHER): Payer: 59 | Admitting: Surgery

## 2018-03-28 ENCOUNTER — Encounter: Payer: Self-pay | Admitting: Surgery

## 2018-03-28 ENCOUNTER — Other Ambulatory Visit: Payer: Self-pay

## 2018-03-28 VITALS — BP 142/105 | HR 102 | Temp 97.7°F | Ht 64.0 in | Wt 146.6 lb

## 2018-03-28 DIAGNOSIS — Z09 Encounter for follow-up examination after completed treatment for conditions other than malignant neoplasm: Secondary | ICD-10-CM

## 2018-03-28 MED ORDER — HYDROCODONE-ACETAMINOPHEN 5-325 MG PO TABS: 1.0000 | ORAL_TABLET | Freq: Four times a day (QID) | ORAL | 0 refills | Status: DC | PRN

## 2018-03-28 NOTE — Patient Instructions (Addendum)
Patient is to return to the office in 2 weeks with Dr.Pabon.   Call the office with any questions or concerns.

## 2018-03-30 NOTE — Progress Notes (Signed)
S/p robotic ventral hernia Seen and examined her sxs have improved The scan personally reviewed with her.  There is no evidence of a small bowel injuries there is no free air.  There is a small seroma associated with the repair.  Patient denies any fevers any chills.  She is taking p.o.  PE NAD Abd: soft, mild diffuse tenderness that is incisional. No peritonitis, no infection  A/p Doing well RTC 2 week Last refill of norco Ice and NSAids No surgical re intervention required

## 2018-04-09 ENCOUNTER — Other Ambulatory Visit
Admission: RE | Admit: 2018-04-09 | Discharge: 2018-04-09 | Disposition: A | Discharge status: Home / Self Care | Payer: Medicare HMO | Source: Ambulatory Visit | Attending: Surgery | Admitting: Surgery

## 2018-04-09 ENCOUNTER — Telehealth: Payer: Self-pay

## 2018-04-09 DIAGNOSIS — R197 Diarrhea, unspecified: Secondary | ICD-10-CM | POA: Diagnosis not present

## 2018-04-09 DIAGNOSIS — R1084 Generalized abdominal pain: Secondary | ICD-10-CM

## 2018-04-09 LAB — CBC WITH DIFFERENTIAL/PLATELET
Abs Immature Granulocytes: 0.03 10*3/uL (ref 0.00–0.07)
Basophils Absolute: 0.1 10*3/uL (ref 0.0–0.1)
Basophils Relative: 1 %
Eosinophils Absolute: 0.2 10*3/uL (ref 0.0–0.5)
Eosinophils Relative: 2 %
HCT: 36.1 % (ref 36.0–46.0)
Hemoglobin: 12.4 g/dL (ref 12.0–15.0)
Immature Granulocytes: 0 %
Lymphocytes Relative: 29 %
Lymphs Abs: 2.4 10*3/uL (ref 0.7–4.0)
MCH: 29.2 pg (ref 26.0–34.0)
MCHC: 34.3 g/dL (ref 30.0–36.0)
MCV: 85.1 fL (ref 80.0–100.0)
Monocytes Absolute: 0.6 10*3/uL (ref 0.1–1.0)
Monocytes Relative: 7 %
Neutro Abs: 5 10*3/uL (ref 1.7–7.7)
Neutrophils Relative %: 61 %
Platelets: 271 10*3/uL (ref 150–400)
RBC: 4.24 MIL/uL (ref 3.87–5.11)
RDW: 12.2 % (ref 11.5–15.5)
WBC: 8.3 10*3/uL (ref 4.0–10.5)
nRBC: 0 % (ref 0.0–0.2)

## 2018-04-09 LAB — BASIC METABOLIC PANEL
Anion gap: 8 (ref 5–15)
BUN: 13 mg/dL (ref 6–20)
CO2: 28 mmol/L (ref 22–32)
Calcium: 8.3 mg/dL — ABNORMAL LOW (ref 8.9–10.3)
Chloride: 103 mmol/L (ref 98–111)
Creatinine, Ser: 0.73 mg/dL (ref 0.44–1.00)
GFR calc Af Amer: >60 mL/min (ref >60)
GFR calc non Af Amer: >60 mL/min (ref >60)
Glucose, Bld: 137 mg/dL — ABNORMAL HIGH (ref 70–99)
Potassium: 2.8 mmol/L — ABNORMAL LOW (ref 3.5–5.1)
Sodium: 139 mmol/L (ref 135–145)

## 2018-04-09 NOTE — Telephone Encounter (Signed)
Notified Dr Dahlia Byes and will do CBC and BMP labs and will have C-Diff stool testing done. Patient notified and will get this done today. She is scheduled for a follow up on Wednesday with Dr Dahlia Byes.

## 2018-04-09 NOTE — Telephone Encounter (Signed)
Patient called and is concerned because she is still having post operative pain that is going into her back. She was in [place] the past weekend and did strain herself with a piece of furniture but says she was having pain before that. She also says that she has had watery diarrhea starting a few days after her surgery. She states that it will go from being watery to foamy with some blood and then will get more solid and then go back to being watery. She does have cramping associated with this. She has had two episodes of incontinence of stool.

## 2018-04-10 ENCOUNTER — Other Ambulatory Visit
Admission: RE | Admit: 2018-04-10 | Discharge: 2018-04-10 | Disposition: A | Discharge status: Home / Self Care | Payer: Medicare HMO | Source: Ambulatory Visit | Attending: Surgery | Admitting: Surgery

## 2018-04-10 ENCOUNTER — Telehealth: Payer: Self-pay | Admitting: *Deleted

## 2018-04-10 DIAGNOSIS — R197 Diarrhea, unspecified: Secondary | ICD-10-CM | POA: Insufficient documentation

## 2018-04-10 DIAGNOSIS — R1084 Generalized abdominal pain: Secondary | ICD-10-CM | POA: Diagnosis not present

## 2018-04-10 DIAGNOSIS — K529 Noninfective gastroenteritis and colitis, unspecified: Secondary | ICD-10-CM | POA: Diagnosis not present

## 2018-04-10 DIAGNOSIS — R21 Rash and other nonspecific skin eruption: Secondary | ICD-10-CM | POA: Diagnosis not present

## 2018-04-10 DIAGNOSIS — M310 Hypersensitivity angiitis: Secondary | ICD-10-CM | POA: Diagnosis not present

## 2018-04-10 DIAGNOSIS — K861 Other chronic pancreatitis: Secondary | ICD-10-CM | POA: Diagnosis not present

## 2018-04-10 LAB — C DIFFICILE QUICK SCREEN W PCR REFLEX
C Diff antigen: NEGATIVE
C Diff interpretation: NOT DETECTED
C Diff toxin: NEGATIVE

## 2018-04-10 NOTE — Telephone Encounter (Signed)
-----   Message from Jules Husbands, MD sent at 04/10/2018  9:55 AM EST ----- Please let the pt know that her labs are nml except her potasium ( not a new issue), please send a copy of the labs to her PCP since they need to correct the low k ----- Message ----- From: Interface, Lab In [place] Sent: 04/09/2018   5:02 PM EST To: Jules Husbands, MD

## 2018-04-10 NOTE — Telephone Encounter (Signed)
Left message for pt to return call. Labs sent to PCP as requested. Dr Glendon Axe follow up potassium levels. Thanks

## 2018-04-10 NOTE — Telephone Encounter (Signed)
Notified patient as instructed, patient pleased. Discussed follow-up appointments, patient agrees. She states she has a rash and just left the doctors office, vasculitis.

## 2018-04-11 ENCOUNTER — Ambulatory Visit (INDEPENDENT_AMBULATORY_CARE_PROVIDER_SITE_OTHER): Payer: Medicare HMO | Admitting: Surgery

## 2018-04-11 ENCOUNTER — Encounter: Payer: Self-pay | Admitting: Surgery

## 2018-04-11 ENCOUNTER — Encounter: Payer: Self-pay | Admitting: *Deleted

## 2018-04-11 ENCOUNTER — Other Ambulatory Visit: Payer: Self-pay

## 2018-04-11 VITALS — BP 162/103 | HR 89 | Temp 97.2°F | Ht 64.0 in | Wt 146.4 lb

## 2018-04-11 DIAGNOSIS — R1084 Generalized abdominal pain: Secondary | ICD-10-CM

## 2018-04-11 NOTE — Patient Instructions (Addendum)
Patient will need to return to the office in 1 week. Patient will need a CT Scan of the Abdomen and Pelvis with contrast   Call the office with any questions or concerns.

## 2018-04-11 NOTE — Progress Notes (Signed)
Patient has been scheduled for a CT abdomen/pelvis with contrast at [facility] for 04-17-18 at 8 am (arrive 7:45 am). Prep: NPO 4 hours prior and pick up prep kit. Patient verbalizes understanding.  The patient will follow up in the office with Dr. Dahlia Byes on 04-18-18 at 2:15 pm as scheduled.

## 2018-04-11 NOTE — Progress Notes (Signed)
S/p rob ventral hernia repair She now had diarrhea. C diff is negative. Her diarrhea has improved. Currently over the weekend the patient reports that she was helping with a move and pull on some furniture causing her significant abdominal pain and she thinks that there might be some trauma to her abdomen. ( she was advised not to do any heavy lifting) Taking PO, no fevers ort chills Labs were nml except K, this has been addressed by PCP  PE NAD Abd: soft, incisions healed. New tender sport on RLQ, no obvious hematoma. No infection, no peritonitis  A/p right lower quadrant abdominal pain cannot exclude trauma of the soft tissue.  On physical exam findings are not that concerning.  I had a lengthy discussion with the patient regarding her disease process.  Given her continued concerns we have agreed that we will do a CT scan of the abdomen and pelvis to rule out any potential soft tissue injuries or intra-abdominal injuries due to trauma.  I will see her back next week.  She is in no distress and there is no need for any emergent surgical intervention.  Note that her symptoms were related after pulling a heavy load and not related to her surgery

## 2018-04-13 DIAGNOSIS — E1365 Other specified diabetes mellitus with hyperglycemia: Secondary | ICD-10-CM | POA: Diagnosis not present

## 2018-04-13 DIAGNOSIS — Z9641 Presence of insulin pump (external) (internal): Secondary | ICD-10-CM | POA: Diagnosis not present

## 2018-04-14 ENCOUNTER — Emergency Department: Payer: Medicare HMO

## 2018-04-14 ENCOUNTER — Other Ambulatory Visit: Payer: Self-pay

## 2018-04-14 ENCOUNTER — Encounter: Payer: Self-pay | Admitting: Emergency Medicine

## 2018-04-14 ENCOUNTER — Emergency Department
Admission: EM | Admit: 2018-04-14 | Discharge: 2018-04-14 | Disposition: A | Discharge status: Home / Self Care | Payer: Medicare HMO | Attending: Emergency Medicine | Admitting: Emergency Medicine

## 2018-04-14 DIAGNOSIS — I10 Essential (primary) hypertension: Secondary | ICD-10-CM | POA: Diagnosis not present

## 2018-04-14 DIAGNOSIS — Z79899 Other long term (current) drug therapy: Secondary | ICD-10-CM | POA: Insufficient documentation

## 2018-04-14 DIAGNOSIS — K91873 Postprocedural seroma of a digestive system organ or structure following other procedure: Secondary | ICD-10-CM | POA: Diagnosis not present

## 2018-04-14 DIAGNOSIS — E119 Type 2 diabetes mellitus without complications: Secondary | ICD-10-CM | POA: Diagnosis not present

## 2018-04-14 DIAGNOSIS — R1031 Right lower quadrant pain: Secondary | ICD-10-CM | POA: Diagnosis not present

## 2018-04-14 LAB — CBC WITH DIFFERENTIAL/PLATELET
Abs Immature Granulocytes: 0.02 10*3/uL (ref 0.00–0.07)
Basophils Absolute: 0 10*3/uL (ref 0.0–0.1)
Basophils Relative: 0 %
Eosinophils Absolute: 0.1 10*3/uL (ref 0.0–0.5)
Eosinophils Relative: 2 %
HCT: 35.2 % — ABNORMAL LOW (ref 36.0–46.0)
Hemoglobin: 12.4 g/dL (ref 12.0–15.0)
Immature Granulocytes: 0 %
Lymphocytes Relative: 31 %
Lymphs Abs: 2.2 10*3/uL (ref 0.7–4.0)
MCH: 29.8 pg (ref 26.0–34.0)
MCHC: 35.2 g/dL (ref 30.0–36.0)
MCV: 84.6 fL (ref 80.0–100.0)
Monocytes Absolute: 0.4 10*3/uL (ref 0.1–1.0)
Monocytes Relative: 5 %
Neutro Abs: 4.4 10*3/uL (ref 1.7–7.7)
Neutrophils Relative %: 62 %
Platelets: 220 10*3/uL (ref 150–400)
RBC: 4.16 MIL/uL (ref 3.87–5.11)
RDW: 12.5 % (ref 11.5–15.5)
WBC: 7.2 10*3/uL (ref 4.0–10.5)
nRBC: 0 % (ref 0.0–0.2)

## 2018-04-14 LAB — COMPREHENSIVE METABOLIC PANEL
ALT: 25 U/L (ref 0–44)
AST: 21 U/L (ref 15–41)
Albumin: 3.7 g/dL (ref 3.5–5.0)
Alkaline Phosphatase: 96 U/L (ref 38–126)
Anion gap: 8 (ref 5–15)
BUN: 15 mg/dL (ref 6–20)
CO2: 27 mmol/L (ref 22–32)
Calcium: 8.2 mg/dL — ABNORMAL LOW (ref 8.9–10.3)
Chloride: 102 mmol/L (ref 98–111)
Creatinine, Ser: 0.61 mg/dL (ref 0.44–1.00)
GFR calc Af Amer: >60 mL/min (ref >60)
GFR calc non Af Amer: >60 mL/min (ref >60)
Glucose, Bld: 209 mg/dL — ABNORMAL HIGH (ref 70–99)
Potassium: 3.6 mmol/L (ref 3.5–5.1)
Sodium: 137 mmol/L (ref 135–145)
Total Bilirubin: 2.6 mg/dL — ABNORMAL HIGH (ref 0.3–1.2)
Total Protein: 6.9 g/dL (ref 6.5–8.1)

## 2018-04-14 LAB — LIPASE, BLOOD: Lipase: 18 U/L (ref 11–51)

## 2018-04-14 MED ORDER — FENTANYL CITRATE (PF) 100 MCG/2ML IJ SOLN
50.0000 ug | Freq: Once | INTRAMUSCULAR | Status: AC
  Administered 2018-04-14: 50 ug via INTRAVENOUS
  Filled 2018-04-14: qty 2

## 2018-04-14 MED ORDER — IOHEXOL 300 MG/ML  SOLN
75.0000 mL | Freq: Once | INTRAMUSCULAR | Status: AC | PRN
  Administered 2018-04-14: 75 mL via INTRAVENOUS

## 2018-04-14 MED ORDER — IOPAMIDOL (ISOVUE-300) INJECTION 61%
15.0000 mL | INTRAVENOUS | Status: AC
  Administered 2018-04-14: 15 mL via ORAL

## 2018-04-14 MED ORDER — HEPARIN SOD (PORK) LOCK FLUSH 100 UNIT/ML IV SOLN
INTRAVENOUS | Status: AC
  Administered 2018-04-14: 500 [IU]
  Filled 2018-04-14: qty 5

## 2018-04-14 MED ORDER — PROMETHAZINE HCL 25 MG/ML IJ SOLN
25.0000 mg | Freq: Once | INTRAMUSCULAR | Status: AC
  Administered 2018-04-14: 25 mg via INTRAVENOUS
  Filled 2018-04-14: qty 1

## 2018-04-14 MED ORDER — SODIUM CHLORIDE 0.9 % IV SOLN
1000.0000 mL | Freq: Once | INTRAVENOUS | Status: AC
  Administered 2018-04-14: 1000 mL via INTRAVENOUS

## 2018-04-14 NOTE — ED Triage Notes (Deleted)
Pt to ed with c/o right side abd pain and cramping x 3 days, and diaphoresis and dizziness with severe pain in abd. Denies n/v/d.

## 2018-04-14 NOTE — ED Notes (Signed)
Patient transported to CT 

## 2018-04-14 NOTE — ED Triage Notes (Signed)
Mid abdominal pain x 2 weeks.

## 2018-04-14 NOTE — ED Provider Notes (Signed)
[facility] Emergency Department Provider Note   ____________________________________________    I have reviewed the triage vital signs and the nursing notes.   HISTORY  Chief Complaint Right lower quadrant abdominal pain    HPI Amber Christensen is a 53 y.o. female with a history of multiple abdominal surgeries who presents with complaints of right lower quadrant pain.  Patient reports she had a hernia repair by Dr. Owens Shark on the left 3 weeks ago which is healed nicely.  She has had some pain in her right lower quadrant and right flank since then which has worsened over the last 24 hours.  She reports the pain is severe and sharp and makes her nauseated.  No history of kidney stones before.  No fevers or chills.  Denies dysuria.  Has not taken anything for this  Past Medical History:  Diagnosis Date  . Allergy    many many allergies  . Anemia    in past  . Anginal pain ([place]) 01/2018   cardiac workup clear  . Anxiety   . 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[place])    pt unaware of this diagnosis  . Bipolar disorder ([place])   . Bladder mass    removed and had tbt. mesh removed  . Blood transfusion without reported diagnosis 1999   received 20 units of blood after tearing esophagus from vomitting so severely after ERCP  . Breast mass    left side biopsy was negative  . Carpal tunnel syndrome    left hand  . Cervical cancer ([place]) 2002  . Chronic kidney disease    cyst on left kidney  . Chronic pancreatitis ([place])    prior to pancreas surgery  . Cirrhosis ([place])   . Clotting disorder ([place])    pt states that sometimes she has difficulty stopping to bleed and other times it is okay  . Depression   . Diabetes mellitus without complication (HCC)    has insulin pump  . Dyspnea   . Esophagitis   . GERD (gastroesophageal reflux disease)   . Headache    migraines...takes compazine for this  . Heart disease   . 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 Hypertension   . Irritable bowel syndrome   . Kidney mass    just watching. left kidney mass.  . Liver disease    NASH. has had liver biopsies. negative except for NASH  . NASH (nonalcoholic steatohepatitis)   . Pancreatitis   . Stroke ([place]) 11/2017   had tia. no longer needs plavix  . Thyroid disease     Patient Active Problem List   Diagnosis Date Noted  . S/P ventral herniorrhaphy 03/15/2018  . Ventral hernia without obstruction or gangrene   . Hypertension 03/21/2017  . Diabetes mellitus without complication ([place]) 49/20/1007  . Pancreatitis 03/21/2017  . Poor venous access 03/21/2017  . Vasculitis ([place]) 08/03/2016  . Acute maculopapular rash 04/21/2016  . Maculopapular rash, generalized 04/20/2016  . Unstable angina ([place]) 09/28/2015    Past Surgical History:  Procedure Laterality Date  . ABDOMINAL HYSTERECTOMY    . APPENDECTOMY    . BLADDER SURGERY     mesh removed. revision which has caused everything to fall again  . BOWEL RESECTION     took a piece of bowel out after pancreatectomy caused problem with bowel.   Marland Kitchen BREAST BIOPSY Left 1989   benign  .  CARDIAC CATHETERIZATION  4627,0350, 1998   no stents, fatty streaks  . CHOLECYSTECTOMY    . CYST REMOVAL HAND Left 1992   Ganglion cyst removed from Left Wrist  . CYSTECTOMY    . DILATATION & CURETTAGE/HYSTEROSCOPY WITH MYOSURE    . ERCP    . ESOPHAGOGASTRODUODENOSCOPY    . KNEE ARTHROSCOPY Left 1992  . LIVER BIOPSY    . PANCREAS SURGERY  2006   partial pancreatectomy after endoscope knicked a part of pancreas.   Marland Kitchen PORTA CATH INSERTION N/A 04/10/2017   Procedure: PORTA CATH INSERTION;  Surgeon: Algernon Huxley, MD;  Location: [facility];  Service: Cardiovascular;  Laterality: N/A;  . REPAIR OF ESOPHAGUS  2012   3 clamps placed in esophagus  . ROBOTIC ASSISTED LAPAROSCOPIC VENTRAL/INCISIONAL HERNIA REPAIR N/A 03/15/2018   Procedure: ROBOTIC ASSISTED  LAPAROSCOPIC VENTRAL HERNIA REPAIR;  Surgeon: Jules Husbands, MD;  Location: [facility];  Service: General;  Laterality: N/A;  . SMALL BOWEL REPAIR    . TONSILLECTOMY      Prior to Admission medications   Medication Sig Start Date End Date Taking? Authorizing Provider  ACIDOPHILUS LACTOBACILLUS PO Take 1 capsule by mouth daily.    Yes [provider]  Calcium Carb-Cholecalciferol (CALCIUM 600/VITAMIN D3 PO) Take 1 tablet by mouth daily.   Yes [provider]  Cholecalciferol (VITAMIN D-3) 5000 units TABS Take 5,000 Units by mouth daily.   Yes [provider]  colchicine 0.6 MG tablet 1 tab daily, start 03/02 04/16/18  Yes [provider]  cyanocobalamin 2000 MCG tablet Take 2,000 mcg by mouth daily.   Yes [provider]  Dexlansoprazole 30 MG capsule Take 30 mg by mouth daily.   Yes [provider]  gabapentin (NEURONTIN) 300 MG capsule Take 2 capsules (600 mg total) by mouth 3 (three) times daily. 03/16/18  Yes Tylene Fantasia, PA-C  hydrochlorothiazide (HYDRODIURIL) 12.5 MG tablet Take 12.5 mg by mouth daily as needed (for fluid).    Yes [provider]  HYDROcodone-acetaminophen (NORCO) 5-325 MG tablet Take 1 tablet by mouth every 6 (six) hours as needed for moderate pain. 03/28/18  Yes Pabon, Diego F, MD  lipase/protease/amylase (CREON) 36000 UNITS CPEP capsule Take 108,000 Units by mouth 3 (three) times daily before meals.   Yes [provider]  lisinopril (PRINIVIL,ZESTRIL) 40 MG tablet Take 40 mg by mouth daily.   Yes [provider]  lubiprostone (AMITIZA) 24 MCG capsule Take 24 mcg by mouth 2 (two) times daily with a meal.   Yes [provider]  Magnesium (V-R MAGNESIUM) 250 MG TABS Take 250 mg by mouth daily.   Yes [provider]  NOVOLOG 100 UNIT/ML injection See admin instructions. Uses via insulin pump sliding scale   Yes [provider]  Oxycodone HCl 10 MG TABS Take by mouth  every 6 (six) hours as needed.   Yes [provider]  potassium chloride (K-DUR,KLOR-CON) 10 MEQ tablet ONE TABLET TWICE A DAY FOR 7 DAYS 04/10/18  Yes [provider]  promethazine (PHENERGAN) 12.5 MG tablet Take 12.5 mg by mouth every 6 (six) hours as needed for nausea or vomiting.   Yes [provider]  vitamin E 400 UNIT capsule Take 400 Units by mouth daily.   Yes [provider]     Allergies Demerol [meperidine]; Dilaudid [hydromorphone hcl]; Metoclopramide; Morphine and related; Morpholine salicylate; Isosorbide nitrate; Sulfa antibiotics; Adhesive [tape]; Darvon [propoxyphene]; Flexeril [cyclobenzaprine]; Levaquin [levofloxacin in d5w]; Naproxen;  Soma [carisoprodol]; and Zofran [ondansetron hcl]  Family History  Problem Relation Age of Onset  . Cirrhosis Mother   . Colon cancer Mother 77       TAH/BSO in ~50; deceased at 26  . Hypertension Mother   . Breast cancer Mother 61       unconfirmed  . Hypertension Father   . Prostate cancer Father 45       currently 68  . Other Father        liver disease  . Breast cancer Maternal Aunt        age at dx unk.; currently 29  . Cervical cancer Maternal Aunt   . Cancer Maternal Uncle        unk. type; currently 74s  . Stomach cancer Paternal Aunt 86       deceased at 27  . Breast cancer Maternal Grandmother 64       possibly bilateral in 68s; deceased 72  . Non-Hodgkin's lymphoma Daughter        unconfirmed; currently 74  . Cancer Maternal Aunt        hematologic malignancy; deceased 30    Social History Social History   Tobacco Use  . Smoking status: Never Smoker  . Smokeless tobacco: Never Used  Substance Use Topics  . Alcohol use: Yes    Comment: rarely  . Drug use: No    Review of Systems  Constitutional: No fever/chills Eyes: No visual changes.  ENT: No sore throat. Cardiovascular: Denies chest pain. Respiratory: Denies shortness of breath. Gastrointestinal: As  above Genitourinary: As above Musculoskeletal: Negative for back pain. Skin: Negative for rash. Neurological: Negative for headaches or weakness   ____________________________________________   PHYSICAL EXAM:  VITAL SIGNS: ED Triage Vitals  Enc Vitals Group     BP 04/14/18 1018 (!) 154/88     Pulse Rate 04/14/18 1018 82     Resp 04/14/18 1018 18     Temp 04/14/18 1018 98 F (36.7 C)     Temp Source 04/14/18 1018 Oral     SpO2 04/14/18 1018 100 %     Weight 04/14/18 1019 64.4 kg (142 lb)     Height 04/14/18 1019 1.626 m (5' 4" )     Head Circumference --      Peak Flow --      Pain Score 04/14/18 1019 10     Pain Loc --      Pain Edu? --      Excl. in [place]? --     Constitutional: Alert and oriented. No acute distress. Eyes: Conjunctivae are normal.  Head: Atraumatic. Nose: No congestion/rhinnorhea. Mouth/Throat: Mucous membranes are moist.   Neck:  Painless ROM Cardiovascular: Normal rate, regular rhythm. Grossly normal heart sounds.  Good peripheral circulation.  Patient has a right anterior chest port Respiratory: Normal respiratory effort.  No retractions. Lungs CTAB. Gastrointestinal: Mild tenderness palpation right lower quadrant. No distention.  Mild right CVA tenderness  Musculoskeletal:  Warm and well perfused Neurologic:  Normal speech and language. No gross focal neurologic deficits are appreciated.  Skin:  Skin is warm, dry and intact. No rash noted. Psychiatric: Mood and affect are normal. Speech and behavior are normal.  ____________________________________________   LABS (all labs ordered are listed, but only abnormal results are displayed)  Labs Reviewed  COMPREHENSIVE METABOLIC PANEL - Abnormal; Notable for the following components:      Result Value   Glucose, Bld 209 (*)    Calcium 8.2 (*)  Total Bilirubin 2.6 (*)    All other components within normal limits  CBC WITH DIFFERENTIAL/PLATELET - Abnormal; Notable for the following components:    HCT 35.2 (*)    All other components within normal limits  LIPASE, BLOOD  URINALYSIS, ROUTINE W REFLEX MICROSCOPIC   ____________________________________________  EKG  None ____________________________________________  RADIOLOGY  None ____________________________________________   PROCEDURES  Procedure(s) performed: No  Procedures   Critical Care performed: No ____________________________________________   INITIAL IMPRESSION / ASSESSMENT AND PLAN / ED COURSE  Pertinent labs & imaging results that were available during my care of the patient were reviewed by me and considered in my medical decision making (see chart for details).  Patient presents with right lower quadrant abdominal pain, mild tenderness in the area, differential includes urinary tract infection, ureterolithiasis, postoperative infection.  We will obtain labs, give IV fentanyl Iv Phenergan, obtain CT abdomen pelvis and reevaluate.  CT demonstrates likely postoperative seromas, discussed with patient's surgeon Dr. Dahlia Byes who feels strongly these are seromas and would like to see the patient in his office in about 1 week.  Patient's lab work is quite reassuring, discussed results with the patient she agrees with this plan.  Return precautions discussed.   ____________________________________________   FINAL CLINICAL IMPRESSION(S) / ED DIAGNOSES  Final diagnoses:  Postprocedural seroma of a digestive system organ or structure following other procedure        Note:  This document was prepared using Dragon voice recognition software and may include unintentional dictation errors.   Lavonia Drafts, MD 04/14/18 1459

## 2018-04-14 NOTE — ED Notes (Signed)
Pt reports pain that starts at umbilicus and radiates into right side and back - she report nausea and diarrhea x2 days

## 2018-04-14 NOTE — ED Notes (Signed)
Port deacessed without difficulty

## 2018-04-16 ENCOUNTER — Telehealth: Payer: Self-pay

## 2018-04-16 NOTE — Telephone Encounter (Signed)
Patient called and had been seen in the ER over the weekend and had a CT done. She would like to know what her next step should be with Dr Dahlia Byes as she could not remember what her discharge instructions were. She says her abdomen is swollen and tender and that she is still nauseated.  I spoke with Dr Dahlia Byes and he has looked at her CT and does not see anything that is concerning, and states that postoperative seroma formation is normal. She may follow up as scheduled on Wednesday and may use ice or cold packs to the abdomen as needed for comfort. We will cancel her CT scheduled for tomorrow as is is not needed as she just had a CT completed.  Message left for the patient with these instructions.

## 2018-04-16 NOTE — Telephone Encounter (Signed)
CT scan has not been cancelled. This is still showing up in the Hall for authorization.

## 2018-04-17 ENCOUNTER — Ambulatory Visit: Admission: RE | Admit: 2018-04-17 | Payer: Medicaid Other | Source: Ambulatory Visit

## 2018-04-17 ENCOUNTER — Ambulatory Visit: Admission: RE | Admit: 2018-04-17 | Payer: Medicare HMO | Source: Ambulatory Visit

## 2018-04-18 ENCOUNTER — Ambulatory Visit (INDEPENDENT_AMBULATORY_CARE_PROVIDER_SITE_OTHER): Payer: Medicare HMO | Admitting: Surgery

## 2018-04-18 ENCOUNTER — Other Ambulatory Visit: Payer: Self-pay

## 2018-04-18 ENCOUNTER — Encounter: Payer: Self-pay | Admitting: *Deleted

## 2018-04-18 ENCOUNTER — Encounter: Payer: Self-pay | Admitting: Surgery

## 2018-04-18 VITALS — BP 137/95 | HR 102 | Temp 97.7°F | Resp 12 | Ht 64.0 in | Wt 147.0 lb

## 2018-04-18 DIAGNOSIS — E876 Hypokalemia: Secondary | ICD-10-CM | POA: Diagnosis not present

## 2018-04-18 DIAGNOSIS — Z09 Encounter for follow-up examination after completed treatment for conditions other than malignant neoplasm: Secondary | ICD-10-CM

## 2018-04-18 NOTE — Progress Notes (Signed)
Patient has been scheduled for an appointment to follow up with Dr. Marton Redwood office on 05-01-18 at 10:30 am (established patient). Patient aware of date and time.

## 2018-04-18 NOTE — Patient Instructions (Addendum)
Patient will need to be seen by GI  Call the office with any questions or concerns.

## 2018-04-20 ENCOUNTER — Encounter: Payer: Self-pay | Admitting: Surgery

## 2018-04-20 NOTE — Progress Notes (Signed)
S/p rob ventral hernia. Did had some trauma . I have reviewed her CT showing small seroma. No hematomas or bleeding, no trauma She has some bloating and abd pain, not related to her surgery  PE NAD Abd: soft, nt, incisions healed and no recurrence  A/p Doing well Seroma expected.  Chronic abd pain, refer back to GI for furhter GI No surgical issues at this time

## 2018-05-03 DIAGNOSIS — R6889 Other general symptoms and signs: Secondary | ICD-10-CM | POA: Diagnosis not present

## 2018-05-30 DIAGNOSIS — K7581 Nonalcoholic steatohepatitis (NASH): Secondary | ICD-10-CM | POA: Diagnosis not present

## 2018-05-30 DIAGNOSIS — Z87891 Personal history of nicotine dependence: Secondary | ICD-10-CM | POA: Diagnosis not present

## 2018-05-30 DIAGNOSIS — K7460 Unspecified cirrhosis of liver: Secondary | ICD-10-CM | POA: Diagnosis not present

## 2018-05-30 DIAGNOSIS — K219 Gastro-esophageal reflux disease without esophagitis: Secondary | ICD-10-CM | POA: Diagnosis not present

## 2018-05-30 DIAGNOSIS — R0602 Shortness of breath: Secondary | ICD-10-CM | POA: Diagnosis not present

## 2018-05-30 DIAGNOSIS — I10 Essential (primary) hypertension: Secondary | ICD-10-CM | POA: Diagnosis not present

## 2018-05-30 DIAGNOSIS — I208 Other forms of angina pectoris: Secondary | ICD-10-CM | POA: Diagnosis not present

## 2018-05-30 DIAGNOSIS — K766 Portal hypertension: Secondary | ICD-10-CM | POA: Diagnosis not present

## 2018-05-30 DIAGNOSIS — I776 Arteritis, unspecified: Secondary | ICD-10-CM | POA: Diagnosis not present

## 2018-07-16 DIAGNOSIS — R194 Change in bowel habit: Secondary | ICD-10-CM | POA: Diagnosis not present

## 2018-07-16 DIAGNOSIS — R197 Diarrhea, unspecified: Secondary | ICD-10-CM | POA: Diagnosis not present

## 2018-07-16 DIAGNOSIS — R945 Abnormal results of liver function studies: Secondary | ICD-10-CM | POA: Diagnosis not present

## 2018-07-16 DIAGNOSIS — R7989 Other specified abnormal findings of blood chemistry: Secondary | ICD-10-CM | POA: Diagnosis not present

## 2018-07-17 DIAGNOSIS — R194 Change in bowel habit: Secondary | ICD-10-CM | POA: Diagnosis not present

## 2018-07-18 DIAGNOSIS — E1365 Other specified diabetes mellitus with hyperglycemia: Secondary | ICD-10-CM | POA: Diagnosis not present

## 2018-07-25 ENCOUNTER — Emergency Department
Admission: EM | Admit: 2018-07-25 | Discharge: 2018-07-25 | Disposition: A | Discharge status: Home / Self Care | Payer: Medicare HMO | Attending: Emergency Medicine | Admitting: Emergency Medicine

## 2018-07-25 ENCOUNTER — Emergency Department: Payer: Medicare HMO

## 2018-07-25 ENCOUNTER — Other Ambulatory Visit: Payer: Self-pay

## 2018-07-25 ENCOUNTER — Encounter: Payer: Self-pay | Admitting: Emergency Medicine

## 2018-07-25 DIAGNOSIS — R1030 Lower abdominal pain, unspecified: Secondary | ICD-10-CM | POA: Diagnosis present

## 2018-07-25 DIAGNOSIS — Z79899 Other long term (current) drug therapy: Secondary | ICD-10-CM | POA: Diagnosis not present

## 2018-07-25 DIAGNOSIS — A0472 Enterocolitis due to Clostridium difficile, not specified as recurrent: Secondary | ICD-10-CM

## 2018-07-25 DIAGNOSIS — E139 Other specified diabetes mellitus without complications: Secondary | ICD-10-CM | POA: Diagnosis not present

## 2018-07-25 DIAGNOSIS — I10 Essential (primary) hypertension: Secondary | ICD-10-CM | POA: Diagnosis not present

## 2018-07-25 DIAGNOSIS — E119 Type 2 diabetes mellitus without complications: Secondary | ICD-10-CM | POA: Diagnosis not present

## 2018-07-25 DIAGNOSIS — Z794 Long term (current) use of insulin: Secondary | ICD-10-CM | POA: Insufficient documentation

## 2018-07-25 DIAGNOSIS — K6389 Other specified diseases of intestine: Secondary | ICD-10-CM | POA: Diagnosis not present

## 2018-07-25 DIAGNOSIS — R109 Unspecified abdominal pain: Secondary | ICD-10-CM | POA: Diagnosis not present

## 2018-07-25 DIAGNOSIS — Z9641 Presence of insulin pump (external) (internal): Secondary | ICD-10-CM | POA: Diagnosis not present

## 2018-07-25 LAB — URINALYSIS, COMPLETE (UACMP) WITH MICROSCOPIC
Bacteria, UA: NONE SEEN
Bilirubin Urine: NEGATIVE
Glucose, UA: 50 mg/dL — AB
Hgb urine dipstick: NEGATIVE
Ketones, ur: NEGATIVE mg/dL
Leukocytes,Ua: NEGATIVE
Nitrite: NEGATIVE
Protein, ur: NEGATIVE mg/dL
Specific Gravity, Urine: 1.014 (ref 1.005–1.030)
pH: 5 (ref 5.0–8.0)

## 2018-07-25 LAB — CBC
HCT: 39.5 % (ref 36.0–46.0)
Hemoglobin: 13.8 g/dL (ref 12.0–15.0)
MCH: 30 pg (ref 26.0–34.0)
MCHC: 34.9 g/dL (ref 30.0–36.0)
MCV: 85.9 fL (ref 80.0–100.0)
Platelets: 235 10*3/uL (ref 150–400)
RBC: 4.6 MIL/uL (ref 3.87–5.11)
RDW: 12.9 % (ref 11.5–15.5)
WBC: 9.3 10*3/uL (ref 4.0–10.5)
nRBC: 0 % (ref 0.0–0.2)

## 2018-07-25 LAB — COMPREHENSIVE METABOLIC PANEL
ALT: 28 U/L (ref 0–44)
AST: 21 U/L (ref 15–41)
Albumin: 4 g/dL (ref 3.5–5.0)
Alkaline Phosphatase: 144 U/L — ABNORMAL HIGH (ref 38–126)
Anion gap: 12 (ref 5–15)
BUN: 23 mg/dL — ABNORMAL HIGH (ref 6–20)
CO2: 25 mmol/L (ref 22–32)
Calcium: 8.1 mg/dL — ABNORMAL LOW (ref 8.9–10.3)
Chloride: 99 mmol/L (ref 98–111)
Creatinine, Ser: 0.87 mg/dL (ref 0.44–1.00)
GFR calc Af Amer: >60 mL/min (ref >60)
GFR calc non Af Amer: >60 mL/min (ref >60)
Glucose, Bld: 295 mg/dL — ABNORMAL HIGH (ref 70–99)
Potassium: 4 mmol/L (ref 3.5–5.1)
Sodium: 136 mmol/L (ref 135–145)
Total Bilirubin: 4.9 mg/dL — ABNORMAL HIGH (ref 0.3–1.2)
Total Protein: 6.8 g/dL (ref 6.5–8.1)

## 2018-07-25 LAB — LIPASE, BLOOD: Lipase: 22 U/L (ref 11–51)

## 2018-07-25 MED ORDER — DICYCLOMINE HCL 10 MG PO CAPS
10.0000 mg | ORAL_CAPSULE | Freq: Once | ORAL | Status: AC
  Administered 2018-07-25: 17:00:00 10 mg via ORAL
  Filled 2018-07-25: qty 1

## 2018-07-25 MED ORDER — HEPARIN SOD (PORK) LOCK FLUSH 100 UNIT/ML IV SOLN
500.0000 [IU] | Freq: Once | INTRAVENOUS | Status: AC
  Administered 2018-07-25: 18:00:00 500 [IU] via INTRAVENOUS
  Filled 2018-07-25: qty 5

## 2018-07-25 MED ORDER — IOHEXOL 300 MG/ML  SOLN
100.0000 mL | Freq: Once | INTRAMUSCULAR | Status: AC | PRN
  Administered 2018-07-25: 100 mL via INTRAVENOUS
  Filled 2018-07-25: qty 100

## 2018-07-25 MED ORDER — SIMETHICONE 20 MG/0.3ML PO SUSP: 20.0000 mg | Freq: Three times a day (TID) | ORAL | 0 refills | Status: DC | PRN

## 2018-07-25 NOTE — ED Provider Notes (Signed)
[facility] Emergency Department Provider Note  ____________________________________________  Time seen: Approximately 1:12 PM  I have reviewed the triage vital signs and the nursing notes.   HISTORY  Chief Complaint Abdominal Pain    HPI Amber Christensen is a 53 y.o. female that presents to the emergency department for evaluation of low abdominal pain for 1 month, worsening the last couple of days.  Patient states that she was diagnosed with C. difficile at the beginning of June.  Today diarrhea started to subside.  She has had 3 loose stools today.  She had a fever at the beginning but has not had one in several days.  Abdominal pain is primarily in the center and radiates to both sides.  Pain kept her up last night.  She was started on vancomycin around June 5, 6.  She had an umbilical hernia repair at the end of April.  Patient went to an endocrinology appointment this morning and was recommended to come to the emergency department for further evaluation.  No nausea, vomiting.  Past Medical History:  Diagnosis Date  . Allergy    many many allergies  . Anemia    in past  . Anginal pain ([place]) 01/2018   cardiac workup clear  . Anxiety   . 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[place])    pt unaware of this diagnosis  . Bipolar disorder ([place])   . Bladder mass    removed and had tbt. mesh removed  . Blood transfusion without reported diagnosis 1999   received 20 units of blood after tearing esophagus from vomitting so severely after ERCP  . Breast mass    left side biopsy was negative  . Carpal tunnel syndrome    left hand  . Cervical cancer ([place]) 2002  . Chronic kidney disease    cyst on left kidney  . Chronic pancreatitis ([place])    prior to pancreas surgery  . Cirrhosis ([place])   . Clotting disorder ([place])    pt states that sometimes she has difficulty stopping to bleed and other times it is okay  . Depression   . Diabetes mellitus without complication (HCC)     has insulin pump  . Dyspnea   . Esophagitis   . GERD (gastroesophageal reflux disease)   . Headache    migraines...takes compazine for this  . Heart disease   . 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 Hypertension   . Irritable bowel syndrome   . Kidney mass    just watching. left kidney mass.  . Liver disease    NASH. has had liver biopsies. negative except for NASH  . NASH (nonalcoholic steatohepatitis)   . Pancreatitis   . Stroke ([place]) 11/2017   had tia. no longer needs plavix  . Thyroid disease     Patient Active Problem List   Diagnosis Date Noted  . S/P ventral herniorrhaphy 03/15/2018  . Ventral hernia without obstruction or gangrene   . Hypertension 03/21/2017  . Diabetes mellitus without complication ([place]) 25/85/2778  . Pancreatitis 03/21/2017  . Poor venous access 03/21/2017  . Vasculitis ([place]) 08/03/2016  . Acute maculopapular rash 04/21/2016  . Maculopapular rash, generalized 04/20/2016  . Unstable angina ([place]) 09/28/2015    Past Surgical History:  Procedure Laterality Date  . ABDOMINAL HYSTERECTOMY    . APPENDECTOMY    . BLADDER SURGERY     mesh removed. revision which has caused  everything to fall again  . BOWEL RESECTION     took a piece of bowel out after pancreatectomy caused problem with bowel.   Marland Kitchen BREAST BIOPSY Left 1989   benign  . CARDIAC CATHETERIZATION  [id number],0174, 1998   no stents, fatty streaks  . CHOLECYSTECTOMY    . CYST REMOVAL HAND Left 1992   Ganglion cyst removed from Left Wrist  . CYSTECTOMY    . DILATATION & CURETTAGE/HYSTEROSCOPY WITH MYOSURE    . ERCP    . ESOPHAGOGASTRODUODENOSCOPY    . KNEE ARTHROSCOPY Left 1992  . LIVER BIOPSY    . PANCREAS SURGERY  2006   partial pancreatectomy after endoscope knicked a part of pancreas.   Marland Kitchen PORTA CATH INSERTION N/A 04/10/2017   Procedure: PORTA CATH INSERTION;  Surgeon: Algernon Huxley, MD;  Location: [place];   Service: Cardiovascular;  Laterality: N/A;  . REPAIR OF ESOPHAGUS  2012   3 clamps placed in esophagus  . ROBOTIC ASSISTED LAPAROSCOPIC VENTRAL/INCISIONAL HERNIA REPAIR N/A 03/15/2018   Procedure: ROBOTIC ASSISTED LAPAROSCOPIC VENTRAL HERNIA REPAIR;  Surgeon: Jules Husbands, MD;  Location: [facility] ORS;  Service: General;  Laterality: N/A;  . SMALL BOWEL REPAIR    . TONSILLECTOMY      Prior to Admission medications   Medication Sig Start Date End Date Taking? Authorizing Provider  ACIDOPHILUS LACTOBACILLUS PO Take 1 capsule by mouth daily.     [provider]  Calcium Carb-Cholecalciferol (CALCIUM 600/VITAMIN D3 PO) Take 1 tablet by mouth daily.    [provider]  Cholecalciferol (VITAMIN D-3) 5000 units TABS Take 5,000 Units by mouth daily.    [provider]  colchicine 0.6 MG tablet 1 tab daily, start 03/02 04/16/18   [provider]  cyanocobalamin 2000 MCG tablet Take 2,000 mcg by mouth daily.    [provider]  Dexlansoprazole 30 MG capsule Take 30 mg by mouth daily.    [provider]  gabapentin (NEURONTIN) 300 MG capsule Take 2 capsules (600 mg total) by mouth 3 (three) times daily. 03/16/18   Tylene Fantasia, PA-C  hydrochlorothiazide (HYDRODIURIL) 12.5 MG tablet Take 12.5 mg by mouth daily as needed (for fluid).     [provider]  HYDROcodone-acetaminophen (NORCO) 5-325 MG tablet Take 1 tablet by mouth every 6 (six) hours as needed for moderate pain. 03/28/18   Pabon, Diego F, MD  lipase/protease/amylase (CREON) 36000 UNITS CPEP capsule Take 108,000 Units by mouth 3 (three) times daily before meals.    [provider]  lisinopril (PRINIVIL,ZESTRIL) 40 MG tablet Take 40 mg by mouth daily.    [provider]  lubiprostone (AMITIZA) 24 MCG capsule Take 24 mcg by mouth 2 (two) times daily with a meal.    [provider]  Magnesium (V-R MAGNESIUM) 250 MG TABS Take 250 mg by mouth daily.    [provider]  NOVOLOG 100 UNIT/ML injection See admin instructions. Uses via insulin pump sliding scale    [provider]  potassium chloride (K-DUR,KLOR-CON) 10 MEQ tablet ONE TABLET TWICE A DAY FOR 7 DAYS 04/10/18   [provider]  promethazine (PHENERGAN) 12.5 MG tablet Take 12.5 mg by mouth every 6 (six) hours as needed for nausea or vomiting.    [provider]  Simethicone 20 MG/0.3ML SUSP Take 0.3 mLs (20 mg total) by mouth 3 (three) times daily as needed for up to 7 days. 07/25/18 08/01/18  Laban Emperor, PA-C  vitamin E 400  UNIT capsule Take 400 Units by mouth daily.    [provider]    Allergies Demerol [meperidine]; Dilaudid [hydromorphone hcl]; Metoclopramide; Morphine and related; Morpholine salicylate; Isosorbide nitrate; Sulfa antibiotics; Adhesive [tape]; Darvon [propoxyphene]; Flexeril [cyclobenzaprine]; Levaquin [levofloxacin in d5w]; Naproxen; Soma [carisoprodol]; and Zofran [ondansetron hcl]  Family History  Problem Relation Age of Onset  . Cirrhosis Mother   . Colon cancer Mother 1       TAH/BSO in ~49; deceased at 7  . Hypertension Mother   . Breast cancer Mother 96       unconfirmed  . Hypertension Father   . Prostate cancer Father 35       currently 49  . Other Father        liver disease  . Breast cancer Maternal Aunt        age at dx unk.; currently 4  . Cervical cancer Maternal Aunt   . Cancer Maternal Uncle        unk. type; currently 36s  . Stomach cancer Paternal Aunt 75       deceased at 67  . Breast cancer Maternal Grandmother 24       possibly bilateral in 59s; deceased 8  . Non-Hodgkin's lymphoma Daughter        unconfirmed; currently 61  . Cancer Maternal Aunt        hematologic malignancy; deceased 98    Social History Social History   Tobacco Use  . Smoking status: Never Smoker  . Smokeless tobacco: Never Used  Substance Use Topics  . Alcohol use: Yes    Comment: rarely  . Drug use: No      Review of Systems  Constitutional: No recent fever/chills ENT: No upper respiratory complaints. Cardiovascular: No chest pain. Respiratory: No SOB. Gastrointestinal: Positive for abdominal pain.  No nausea, no vomiting.  Positive for diarrhea. Musculoskeletal: Negative for musculoskeletal pain. Skin: Negative for rash, abrasions, lacerations, ecchymosis. Neurological: Negative for headaches, numbness or tingling   ____________________________________________   PHYSICAL EXAM:  VITAL SIGNS: ED Triage Vitals  Enc Vitals Group     BP 07/25/18 0910 135/86     Pulse Rate 07/25/18 0910 88     Resp 07/25/18 0910 16     Temp 07/25/18 0910 98.7 F (37.1 C)     Temp Source 07/25/18 0910 Oral     SpO2 07/25/18 0910 98 %     Weight 07/25/18 0912 147 lb 0.8 oz (66.7 kg)     Height --      Head Circumference --      Peak Flow --      Pain Score 07/25/18 0912 10     Pain Loc --      Pain Edu? --      Excl. in [place]? --      Constitutional: Alert and oriented. Well appearing and in no acute distress. Eyes: Conjunctivae are normal. PERRL. EOMI. Head: Atraumatic. ENT:      Ears:      Nose: No congestion/rhinnorhea.      Mouth/Throat: Mucous membranes are moist.  Neck: No stridor.  Cardiovascular: Normal rate, regular rhythm.  Good peripheral circulation. Respiratory: Normal respiratory effort without tachypnea or retractions. Lungs CTAB. Good air entry to the bases with no decreased or absent breath sounds. Gastrointestinal: Bowel sounds 4 quadrants.  Low mild central abdominal tenderness. No guarding or rigidity. No palpable masses. No distention.  Musculoskeletal: Full range of motion to all extremities. No gross deformities appreciated. Neurologic:  Normal speech and language. No gross focal neurologic deficits are appreciated.  Skin:  Skin is warm, dry and intact. No rash noted. Psychiatric: Mood and affect are normal. Speech and behavior are normal. Patient exhibits  appropriate insight and judgement.   ____________________________________________   LABS (all labs ordered are listed, but only abnormal results are displayed)  Labs Reviewed  COMPREHENSIVE METABOLIC PANEL - Abnormal; Notable for the following components:      Result Value   Glucose, Bld 295 (*)    BUN 23 (*)    Calcium 8.1 (*)    Alkaline Phosphatase 144 (*)    Total Bilirubin 4.9 (*)    All other components within normal limits  URINALYSIS, COMPLETE (UACMP) WITH MICROSCOPIC - Abnormal; Notable for the following components:   Color, Urine YELLOW (*)    APPearance CLEAR (*)    Glucose, UA 50 (*)    All other components within normal limits  CBC  LIPASE, BLOOD   ____________________________________________  EKG   ____________________________________________  RADIOLOGY   Dg Abdomen 1 View  Result Date: 07/25/2018 CLINICAL DATA:  Abdominal pain.  C diff. EXAM: ABDOMEN - 1 VIEW COMPARISON:  04/14/2018 CT abdomen/pelvis FINDINGS: No dilated small bowel loops. Large colonic stool volume. No evidence of pneumatosis or pneumoperitoneum. No radiopaque urolithiasis. Cholecystectomy clips are seen in the right upper quadrant of the abdomen. Clear lung bases. IMPRESSION: Nonobstructive bowel gas pattern. Large colonic stool volume suggests constipation. Electronically Signed   By: Ilona Sorrel M.D.   On: 07/25/2018 14:19   Ct Abdomen Pelvis W Contrast  Result Date: 07/25/2018 CLINICAL DATA:  Abdominal pain, C diff, nausea EXAM: CT ABDOMEN AND PELVIS WITH CONTRAST TECHNIQUE: Multidetector CT imaging of the abdomen and pelvis was performed using the standard protocol following bolus administration of intravenous contrast. CONTRAST:  151m OMNIPAQUE IOHEXOL 300 MG/ML  SOLN, COMPARISON:  04/14/2018, 03/26/2018 FINDINGS: Lower chest: No acute abnormality. Hepatobiliary: No focal liver abnormality is seen. Status post cholecystectomy. No biliary dilatation. Pancreas: Unremarkable. No  pancreatic ductal dilatation or surrounding inflammatory changes. Spleen: Normal in size without significant abnormality. Adrenals/Urinary Tract: Adrenal glands are unremarkable. Kidneys are normal, without renal calculi, solid lesion, or hydronephrosis. Bladder is unremarkable. Stomach/Bowel: Stomach is within normal limits. Status post appendectomy. There is diffuse inflammatory thickening and fat stranding about the mid small bowel (series 2, image 52, series 5, image 24). Large burden of stool in the colon without colonic thickening or other inflammatory findings. Vascular/Lymphatic: No significant vascular findings are present. No enlarged abdominal or pelvic lymph nodes. Reproductive: Status post hysterectomy. Other: No abdominal wall hernia or abnormality. Unchanged small fluid collection along the anterior midline abdomen (series 2, image 57). No abdominopelvic ascites. Musculoskeletal: No acute or significant osseous findings. IMPRESSION: 1. There is diffuse inflammatory thickening and fat stranding about the mid small bowel (series 2, image 52, series 5, image 24). Findings are consistent with nonspecific infectious or inflammatory enteritis. 2. Large burden of stool in the colon without colonic thickening or other inflammatory findings. 3. Chronic, incidental, and postoperative findings as detailed above. Electronically Signed   By: AEddie CandleM.D.   On: 07/25/2018 16:28    ____________________________________________    PROCEDURES  Procedure(s) performed:    Procedures    Medications  dicyclomine (BENTYL) capsule 10 mg (10 mg Oral Given 07/25/18 1640)  iohexol (OMNIPAQUE) 300 MG/ML solution 100 mL (100 mLs Intravenous Contrast Given 07/25/18 1612)  heparin lock flush 100 unit/mL (500 Units Intravenous Given 07/25/18 1755)  ____________________________________________   INITIAL IMPRESSION / ASSESSMENT AND PLAN / ED COURSE  Pertinent labs & imaging results that were available  during my care of the patient were reviewed by me and considered in my medical decision making (see chart for details).  Review of the [place] CSRS was performed in accordance of the [place] prior to dispensing any controlled drugs.   Patient presents emergency department for evaluation of low abdominal pain with known C. difficile infection.  Vital signs and exam are reassuring.  Patient is on day 5 of vancomycin.  Lab work largely unremarkable.  Dr. Cherylann Banas has also evaluated patient and is agreeable with plan of outpatient follow-up pending reassuring CT scan.  CT consistent with C. difficile infection.  Dr. Alice Reichert was consulted and recommends that patient be started on simethicone drops for gas and bloating.  Patient will be discharged home with prescriptions for simethicone. Patient is to follow up with GI as directed. Patient is given ED precautions to return to the ED for any worsening or new symptoms.     ____________________________________________  FINAL CLINICAL IMPRESSION(S) / ED DIAGNOSES  Final diagnoses:  C. difficile enteritis      NEW MEDICATIONS STARTED DURING THIS VISIT:  ED Discharge Orders         Ordered    Simethicone 20 MG/0.3ML SUSP  3 times daily PRN     07/25/18 1742              This chart was dictated using voice recognition software/Dragon. Despite best efforts to proofread, errors can occur which can change the meaning. Any change was purely unintentional.    Laban Emperor, PA-C 07/25/18 2053    Arta Silence, MD 07/28/18 0045

## 2018-07-25 NOTE — Discharge Instructions (Addendum)
Your labwork and CT are reassuring. I spoke with the gastroenterology doctor here and he recomends simethicone drops for your discomfort. Continue your antibiotics. Please call [facility] for an appointment this week or early next week.

## 2018-07-25 NOTE — ED Triage Notes (Signed)
C/O several days of abdominal pain.  STates has C Diff.  Denies emesis.  But c/o nausea.

## 2018-07-25 NOTE — ED Notes (Signed)
Patient transported to CT 

## 2018-07-25 NOTE — ED Notes (Signed)
Patient has a [facility] port, and states veins are so bad that no peripheral sticks are possible.  No blood drawn in triage.

## 2018-08-07 DIAGNOSIS — M310 Hypersensitivity angiitis: Secondary | ICD-10-CM | POA: Diagnosis not present

## 2018-08-07 DIAGNOSIS — R21 Rash and other nonspecific skin eruption: Secondary | ICD-10-CM | POA: Diagnosis not present

## 2018-08-07 DIAGNOSIS — K861 Other chronic pancreatitis: Secondary | ICD-10-CM | POA: Diagnosis not present

## 2018-08-07 DIAGNOSIS — E139 Other specified diabetes mellitus without complications: Secondary | ICD-10-CM | POA: Diagnosis not present

## 2018-08-10 DIAGNOSIS — M310 Hypersensitivity angiitis: Secondary | ICD-10-CM | POA: Diagnosis not present

## 2018-08-10 DIAGNOSIS — E1365 Other specified diabetes mellitus with hyperglycemia: Secondary | ICD-10-CM | POA: Diagnosis not present

## 2018-08-10 DIAGNOSIS — I10 Essential (primary) hypertension: Secondary | ICD-10-CM | POA: Diagnosis not present

## 2018-08-10 DIAGNOSIS — R739 Hyperglycemia, unspecified: Secondary | ICD-10-CM | POA: Diagnosis not present

## 2018-08-10 DIAGNOSIS — Z7689 Persons encountering health services in other specified circumstances: Secondary | ICD-10-CM | POA: Diagnosis not present

## 2018-08-10 DIAGNOSIS — R109 Unspecified abdominal pain: Secondary | ICD-10-CM | POA: Diagnosis not present

## 2018-08-10 DIAGNOSIS — Z794 Long term (current) use of insulin: Secondary | ICD-10-CM | POA: Diagnosis not present

## 2018-08-20 DIAGNOSIS — A0472 Enterocolitis due to Clostridium difficile, not specified as recurrent: Secondary | ICD-10-CM | POA: Diagnosis not present

## 2018-08-20 DIAGNOSIS — R7989 Other specified abnormal findings of blood chemistry: Secondary | ICD-10-CM | POA: Diagnosis not present

## 2018-08-21 ENCOUNTER — Other Ambulatory Visit
Admission: RE | Admit: 2018-08-21 | Discharge: 2018-08-21 | Disposition: A | Discharge status: Home / Self Care | Payer: Medicare HMO | Source: Ambulatory Visit | Attending: Gastroenterology | Admitting: Gastroenterology

## 2018-08-21 DIAGNOSIS — A0472 Enterocolitis due to Clostridium difficile, not specified as recurrent: Secondary | ICD-10-CM | POA: Diagnosis not present

## 2018-08-21 DIAGNOSIS — R7989 Other specified abnormal findings of blood chemistry: Secondary | ICD-10-CM | POA: Diagnosis not present

## 2018-08-21 LAB — C DIFFICILE QUICK SCREEN W PCR REFLEX
C Diff antigen: NEGATIVE
C Diff interpretation: NOT DETECTED
C Diff toxin: NEGATIVE

## 2018-09-23 ENCOUNTER — Encounter: Payer: Self-pay | Admitting: Emergency Medicine

## 2018-09-23 ENCOUNTER — Other Ambulatory Visit: Payer: Self-pay

## 2018-09-23 ENCOUNTER — Observation Stay
Admission: EM | Admit: 2018-09-23 | Discharge: 2018-09-25 | Disposition: A | Discharge status: Home / Self Care | Payer: Medicare HMO | Attending: Specialist | Admitting: Specialist

## 2018-09-23 DIAGNOSIS — I129 Hypertensive chronic kidney disease with stage 1 through stage 4 chronic kidney disease, or unspecified chronic kidney disease: Secondary | ICD-10-CM | POA: Diagnosis not present

## 2018-09-23 DIAGNOSIS — M7989 Other specified soft tissue disorders: Secondary | ICD-10-CM | POA: Diagnosis not present

## 2018-09-23 DIAGNOSIS — E1122 Type 2 diabetes mellitus with diabetic chronic kidney disease: Secondary | ICD-10-CM | POA: Insufficient documentation

## 2018-09-23 DIAGNOSIS — Z8673 Personal history of transient ischemic attack (TIA), and cerebral infarction without residual deficits: Secondary | ICD-10-CM | POA: Insufficient documentation

## 2018-09-23 DIAGNOSIS — K589 Irritable bowel syndrome without diarrhea: Secondary | ICD-10-CM | POA: Diagnosis not present

## 2018-09-23 DIAGNOSIS — N189 Chronic kidney disease, unspecified: Secondary | ICD-10-CM | POA: Diagnosis not present

## 2018-09-23 DIAGNOSIS — R000 Tachycardia, unspecified: Secondary | ICD-10-CM | POA: Diagnosis not present

## 2018-09-23 DIAGNOSIS — E271 Primary adrenocortical insufficiency: Secondary | ICD-10-CM | POA: Diagnosis not present

## 2018-09-23 DIAGNOSIS — F419 Anxiety disorder, unspecified: Secondary | ICD-10-CM | POA: Diagnosis not present

## 2018-09-23 DIAGNOSIS — Z886 Allergy status to analgesic agent status: Secondary | ICD-10-CM | POA: Insufficient documentation

## 2018-09-23 DIAGNOSIS — K7581 Nonalcoholic steatohepatitis (NASH): Secondary | ICD-10-CM | POA: Diagnosis not present

## 2018-09-23 DIAGNOSIS — Z8249 Family history of ischemic heart disease and other diseases of the circulatory system: Secondary | ICD-10-CM | POA: Insufficient documentation

## 2018-09-23 DIAGNOSIS — K861 Other chronic pancreatitis: Secondary | ICD-10-CM | POA: Diagnosis not present

## 2018-09-23 DIAGNOSIS — M109 Gout, unspecified: Secondary | ICD-10-CM | POA: Diagnosis not present

## 2018-09-23 DIAGNOSIS — Z8541 Personal history of malignant neoplasm of cervix uteri: Secondary | ICD-10-CM | POA: Insufficient documentation

## 2018-09-23 DIAGNOSIS — E1140 Type 2 diabetes mellitus with diabetic neuropathy, unspecified: Secondary | ICD-10-CM | POA: Diagnosis not present

## 2018-09-23 DIAGNOSIS — Z90710 Acquired absence of both cervix and uterus: Secondary | ICD-10-CM | POA: Diagnosis not present

## 2018-09-23 DIAGNOSIS — Z794 Long term (current) use of insulin: Secondary | ICD-10-CM | POA: Diagnosis not present

## 2018-09-23 DIAGNOSIS — K7460 Unspecified cirrhosis of liver: Secondary | ICD-10-CM | POA: Diagnosis not present

## 2018-09-23 DIAGNOSIS — Z9049 Acquired absence of other specified parts of digestive tract: Secondary | ICD-10-CM | POA: Insufficient documentation

## 2018-09-23 DIAGNOSIS — Z9641 Presence of insulin pump (external) (internal): Secondary | ICD-10-CM | POA: Insufficient documentation

## 2018-09-23 DIAGNOSIS — F319 Bipolar disorder, unspecified: Secondary | ICD-10-CM | POA: Insufficient documentation

## 2018-09-23 DIAGNOSIS — Z881 Allergy status to other antibiotic agents status: Secondary | ICD-10-CM | POA: Insufficient documentation

## 2018-09-23 DIAGNOSIS — R739 Hyperglycemia, unspecified: Secondary | ICD-10-CM | POA: Diagnosis present

## 2018-09-23 DIAGNOSIS — E871 Hypo-osmolality and hyponatremia: Secondary | ICD-10-CM | POA: Insufficient documentation

## 2018-09-23 DIAGNOSIS — Z20828 Contact with and (suspected) exposure to other viral communicable diseases: Secondary | ICD-10-CM | POA: Diagnosis not present

## 2018-09-23 DIAGNOSIS — I10 Essential (primary) hypertension: Secondary | ICD-10-CM | POA: Diagnosis not present

## 2018-09-23 DIAGNOSIS — E079 Disorder of thyroid, unspecified: Secondary | ICD-10-CM | POA: Diagnosis not present

## 2018-09-23 DIAGNOSIS — E1165 Type 2 diabetes mellitus with hyperglycemia: Principal | ICD-10-CM | POA: Insufficient documentation

## 2018-09-23 DIAGNOSIS — K219 Gastro-esophageal reflux disease without esophagitis: Secondary | ICD-10-CM | POA: Diagnosis not present

## 2018-09-23 DIAGNOSIS — Z885 Allergy status to narcotic agent status: Secondary | ICD-10-CM | POA: Insufficient documentation

## 2018-09-23 DIAGNOSIS — Z79899 Other long term (current) drug therapy: Secondary | ICD-10-CM | POA: Insufficient documentation

## 2018-09-23 DIAGNOSIS — G629 Polyneuropathy, unspecified: Secondary | ICD-10-CM | POA: Diagnosis not present

## 2018-09-23 DIAGNOSIS — Z888 Allergy status to other drugs, medicaments and biological substances status: Secondary | ICD-10-CM | POA: Insufficient documentation

## 2018-09-23 DIAGNOSIS — E119 Type 2 diabetes mellitus without complications: Secondary | ICD-10-CM

## 2018-09-23 DIAGNOSIS — Z03818 Encounter for observation for suspected exposure to other biological agents ruled out: Secondary | ICD-10-CM | POA: Diagnosis not present

## 2018-09-23 DIAGNOSIS — Z882 Allergy status to sulfonamides status: Secondary | ICD-10-CM | POA: Insufficient documentation

## 2018-09-23 LAB — BLOOD GAS, VENOUS
Acid-Base Excess: 5.8 mmol/L — ABNORMAL HIGH (ref 0.0–2.0)
Bicarbonate: 31.2 mmol/L — ABNORMAL HIGH (ref 20.0–28.0)
O2 Saturation: 85.4 %
Patient temperature: 37
pCO2, Ven: 47 mmHg (ref 44.0–60.0)
pH, Ven: 7.43 (ref 7.250–7.430)
pO2, Ven: 49 mmHg — ABNORMAL HIGH (ref 32.0–45.0)

## 2018-09-23 LAB — GLUCOSE, CAPILLARY
Glucose-Capillary: >600 mg/dL (ref 70–99)
Glucose-Capillary: 451 mg/dL — ABNORMAL HIGH (ref 70–99)
Glucose-Capillary: 386 mg/dL — ABNORMAL HIGH (ref 70–99)

## 2018-09-23 LAB — CBC
HCT: 38.1 % (ref 36.0–46.0)
Hemoglobin: 13.5 g/dL (ref 12.0–15.0)
MCH: 29.7 pg (ref 26.0–34.0)
MCHC: 35.4 g/dL (ref 30.0–36.0)
MCV: 83.7 fL (ref 80.0–100.0)
Platelets: 194 10*3/uL (ref 150–400)
RBC: 4.55 MIL/uL (ref 3.87–5.11)
RDW: 12.3 % (ref 11.5–15.5)
WBC: 7.4 10*3/uL (ref 4.0–10.5)
nRBC: 0 % (ref 0.0–0.2)

## 2018-09-23 LAB — BASIC METABOLIC PANEL
Anion gap: 12 (ref 5–15)
BUN: 21 mg/dL — ABNORMAL HIGH (ref 6–20)
CO2: 26 mmol/L (ref 22–32)
Calcium: 8.5 mg/dL — ABNORMAL LOW (ref 8.9–10.3)
Chloride: 95 mmol/L — ABNORMAL LOW (ref 98–111)
Creatinine, Ser: 1.07 mg/dL — ABNORMAL HIGH (ref 0.44–1.00)
GFR calc Af Amer: >60 mL/min (ref >60)
GFR calc non Af Amer: 59 mL/min — ABNORMAL LOW (ref >60)
Glucose, Bld: 631 mg/dL (ref 70–99)
Potassium: 4.3 mmol/L (ref 3.5–5.1)
Sodium: 133 mmol/L — ABNORMAL LOW (ref 135–145)

## 2018-09-23 LAB — URINALYSIS, COMPLETE (UACMP) WITH MICROSCOPIC
Bacteria, UA: NONE SEEN
Bilirubin Urine: NEGATIVE
Glucose, UA: >=500 mg/dL — AB
Hgb urine dipstick: NEGATIVE
Ketones, ur: NEGATIVE mg/dL
Leukocytes,Ua: NEGATIVE
Nitrite: NEGATIVE
Protein, ur: NEGATIVE mg/dL
Specific Gravity, Urine: 1.024 (ref 1.005–1.030)
pH: 6 (ref 5.0–8.0)

## 2018-09-23 LAB — SARS CORONAVIRUS 2 BY RT PCR (HOSPITAL ORDER, PERFORMED IN ~~LOC~~ HOSPITAL LAB): SARS Coronavirus 2: NEGATIVE

## 2018-09-23 LAB — BETA-HYDROXYBUTYRIC ACID: Beta-Hydroxybutyric Acid: 0.42 mmol/L — ABNORMAL HIGH (ref 0.05–0.27)

## 2018-09-23 MED ORDER — ACETAMINOPHEN 650 MG RE SUPP: 650.0000 mg | Freq: Four times a day (QID) | RECTAL | Status: DC | PRN

## 2018-09-23 MED ORDER — INSULIN ASPART 100 UNIT/ML ~~LOC~~ SOLN: 0.0000 [IU] | Freq: Three times a day (TID) | SUBCUTANEOUS | Status: DC

## 2018-09-23 MED ORDER — INSULIN ASPART 100 UNIT/ML ~~LOC~~ SOLN: 20.0000 [IU] | Freq: Once | SUBCUTANEOUS | Status: DC

## 2018-09-23 MED ORDER — VITAMIN E 180 MG (400 UNIT) PO CAPS
400.0000 [IU] | ORAL_CAPSULE | Freq: Every day | ORAL | Status: DC
  Administered 2018-09-24 – 2018-09-25 (×2): 400 [IU] via ORAL
  Filled 2018-09-23 (×2): qty 1

## 2018-09-23 MED ORDER — COLCHICINE 0.6 MG PO TABS
0.6000 mg | ORAL_TABLET | Freq: Every day | ORAL | Status: DC
  Administered 2018-09-24 – 2018-09-25 (×2): 0.6 mg via ORAL
  Filled 2018-09-23 (×2): qty 1

## 2018-09-23 MED ORDER — PROMETHAZINE HCL 25 MG PO TABS
12.5000 mg | ORAL_TABLET | Freq: Four times a day (QID) | ORAL | Status: DC | PRN
  Filled 2018-09-23: qty 1

## 2018-09-23 MED ORDER — GABAPENTIN 300 MG PO CAPS
600.0000 mg | ORAL_CAPSULE | Freq: Three times a day (TID) | ORAL | Status: DC
  Administered 2018-09-24 – 2018-09-25 (×5): 600 mg via ORAL
  Filled 2018-09-23 (×5): qty 2

## 2018-09-23 MED ORDER — HYDROCHLOROTHIAZIDE 25 MG PO TABS: 12.5000 mg | ORAL_TABLET | Freq: Every day | ORAL | Status: DC | PRN

## 2018-09-23 MED ORDER — INSULIN ASPART 100 UNIT/ML ~~LOC~~ SOLN
SUBCUTANEOUS | Status: AC
  Administered 2018-09-24: 9 [IU] via SUBCUTANEOUS
  Filled 2018-09-23: qty 1

## 2018-09-23 MED ORDER — INSULIN ASPART 100 UNIT/ML ~~LOC~~ SOLN
0.0000 [IU] | Freq: Three times a day (TID) | SUBCUTANEOUS | Status: DC
  Administered 2018-09-24: 5 [IU] via SUBCUTANEOUS
  Administered 2018-09-24: 9 [IU] via SUBCUTANEOUS
  Administered 2018-09-24 (×2): 5 [IU] via SUBCUTANEOUS
  Administered 2018-09-24: 3 [IU] via SUBCUTANEOUS
  Administered 2018-09-25: 5 [IU] via SUBCUTANEOUS
  Administered 2018-09-25: 2 [IU] via SUBCUTANEOUS
  Filled 2018-09-23 (×6): qty 1

## 2018-09-23 MED ORDER — PANCRELIPASE (LIP-PROT-AMYL) 12000-38000 UNITS PO CPEP
108000.0000 [IU] | ORAL_CAPSULE | Freq: Three times a day (TID) | ORAL | Status: DC
  Administered 2018-09-24 (×3): 108000 [IU] via ORAL
  Filled 2018-09-23 (×8): qty 9

## 2018-09-23 MED ORDER — SODIUM CHLORIDE 0.9 % IV BOLUS
1000.0000 mL | Freq: Once | INTRAVENOUS | Status: AC
  Administered 2018-09-23: 1000 mL via INTRAVENOUS

## 2018-09-23 MED ORDER — INSULIN DETEMIR 100 UNIT/ML ~~LOC~~ SOLN
15.0000 [IU] | Freq: Every day | SUBCUTANEOUS | Status: DC
  Filled 2018-09-23: qty 0.15

## 2018-09-23 MED ORDER — PANTOPRAZOLE SODIUM 40 MG PO TBEC
40.0000 mg | DELAYED_RELEASE_TABLET | Freq: Every day | ORAL | Status: DC
  Administered 2018-09-24 – 2018-09-25 (×2): 40 mg via ORAL
  Filled 2018-09-23 (×2): qty 1

## 2018-09-23 MED ORDER — ONDANSETRON HCL 4 MG/2ML IJ SOLN
4.0000 mg | Freq: Four times a day (QID) | INTRAMUSCULAR | Status: DC | PRN
  Administered 2018-09-24 (×2): 4 mg via INTRAVENOUS
  Filled 2018-09-23 (×2): qty 2

## 2018-09-23 MED ORDER — RISAQUAD PO CAPS
1.0000 | ORAL_CAPSULE | Freq: Every day | ORAL | Status: DC
  Administered 2018-09-24 – 2018-09-25 (×2): 1 via ORAL
  Filled 2018-09-23 (×2): qty 1

## 2018-09-23 MED ORDER — INSULIN ASPART 100 UNIT/ML ~~LOC~~ SOLN: 0.0000 [IU] | Freq: Every day | SUBCUTANEOUS | Status: DC

## 2018-09-23 MED ORDER — LISINOPRIL 20 MG PO TABS
40.0000 mg | ORAL_TABLET | Freq: Every day | ORAL | Status: DC
  Administered 2018-09-24: 40 mg via ORAL
  Filled 2018-09-23: qty 2

## 2018-09-23 MED ORDER — SODIUM CHLORIDE 0.9 % IV SOLN
INTRAVENOUS | Status: DC
  Administered 2018-09-23: via INTRAVENOUS

## 2018-09-23 MED ORDER — ACETAMINOPHEN 325 MG PO TABS
650.0000 mg | ORAL_TABLET | Freq: Once | ORAL | Status: AC
  Administered 2018-09-23: 650 mg via ORAL
  Filled 2018-09-23: qty 2

## 2018-09-23 MED ORDER — LUBIPROSTONE 24 MCG PO CAPS
24.0000 ug | ORAL_CAPSULE | Freq: Two times a day (BID) | ORAL | Status: DC
  Administered 2018-09-24 – 2018-09-25 (×3): 24 ug via ORAL
  Filled 2018-09-23 (×4): qty 1

## 2018-09-23 MED ORDER — VITAMIN B-12 1000 MCG PO TABS
2000.0000 ug | ORAL_TABLET | Freq: Every day | ORAL | Status: DC
  Administered 2018-09-24 – 2018-09-25 (×2): 2000 ug via ORAL
  Filled 2018-09-23 (×2): qty 2

## 2018-09-23 MED ORDER — HYDROCODONE-ACETAMINOPHEN 5-325 MG PO TABS
1.0000 | ORAL_TABLET | Freq: Four times a day (QID) | ORAL | Status: DC | PRN
  Administered 2018-09-24: 1 via ORAL
  Filled 2018-09-23: qty 1

## 2018-09-23 MED ORDER — POTASSIUM CHLORIDE CRYS ER 10 MEQ PO TBCR
10.0000 meq | EXTENDED_RELEASE_TABLET | Freq: Every day | ORAL | Status: DC
  Administered 2018-09-24 – 2018-09-25 (×2): 10 meq via ORAL
  Filled 2018-09-23 (×2): qty 1

## 2018-09-23 MED ORDER — ENOXAPARIN SODIUM 40 MG/0.4ML ~~LOC~~ SOLN
40.0000 mg | SUBCUTANEOUS | Status: DC
  Administered 2018-09-23 – 2018-09-24 (×2): 40 mg via SUBCUTANEOUS
  Filled 2018-09-23 (×2): qty 0.4

## 2018-09-23 MED ORDER — ONDANSETRON HCL 4 MG PO TABS: 4.0000 mg | ORAL_TABLET | Freq: Four times a day (QID) | ORAL | Status: DC | PRN

## 2018-09-23 MED ORDER — INSULIN DETEMIR 100 UNIT/ML ~~LOC~~ SOLN
35.0000 [IU] | Freq: Every day | SUBCUTANEOUS | Status: DC
  Filled 2018-09-23: qty 0.35

## 2018-09-23 MED ORDER — ACETAMINOPHEN 325 MG PO TABS
650.0000 mg | ORAL_TABLET | Freq: Four times a day (QID) | ORAL | Status: DC | PRN
  Administered 2018-09-25: 650 mg via ORAL
  Filled 2018-09-23: qty 2

## 2018-09-23 NOTE — ED Provider Notes (Signed)
[facility] Emergency Department Provider Note  ____________________________________________   First MD Initiated Contact with Patient 09/23/18 1808     (approximate)  I have reviewed the triage vital signs and the nursing notes.   HISTORY  Chief Complaint Hyperglycemia    HPI Amber Christensen is a 53 y.o. female with bipolar, CKD, prior abdominal surgeries, diabetes who presents with hyperglycemia.  Patient does not have her insulin pump anymore because insurance would not pay for it therefore patient had her insulin changed and has been having trouble controlling her insulin.  It is been over 600 today patient last took 8 units of insulin at 4 PM.  Patient says that she takes 15 units at nighttime and has a sliding scale of NovoLog.  Initially after discontinuing her insulin pump about a month ago her sugars were doing okay over the past 3 weeks they have been intermittently between the 200s to 600s. she thinks that the spike today was secondary to increasing stress at home but prior to this it has been elevated as well.  Hyperglycemia, intermittent, constant for the past 2 days, nothing makes it better, nothing makes it worse      Past Medical History:  Diagnosis Date  . Allergy    many many allergies  . Anemia    in past  . Anginal pain ([place]) 01/2018   cardiac workup clear  . Anxiety   . 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[place])    pt unaware of this diagnosis  . Bipolar disorder ([place])   . Bladder mass    removed and had tbt. mesh removed  . Blood transfusion without reported diagnosis 1999   received 20 units of blood after tearing esophagus from vomitting so severely after ERCP  . Breast mass    left side biopsy was negative  . Carpal tunnel syndrome    left hand  . Cervical cancer ([place]) 2002  . Chronic kidney disease    cyst on left kidney  . Chronic pancreatitis ([place])    prior to pancreas surgery  . Cirrhosis ([place])   . Clotting disorder ([place])     pt states that sometimes she has difficulty stopping to bleed and other times it is okay  . Depression   . Diabetes mellitus without complication (HCC)    has insulin pump  . Dyspnea   . Esophagitis   . GERD (gastroesophageal reflux disease)   . Headache    migraines...takes compazine for this  . Heart disease   . 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 Hypertension   . Irritable bowel syndrome   . Kidney mass    just watching. left kidney mass.  . Liver disease    NASH. has had liver biopsies. negative except for NASH  . NASH (nonalcoholic steatohepatitis)   . Pancreatitis   . Stroke ([place]) 11/2017   had tia. no longer needs plavix  . Thyroid disease     Patient Active Problem List   Diagnosis Date Noted  . S/P ventral herniorrhaphy 03/15/2018  . Ventral hernia without obstruction or gangrene   . Hypertension 03/21/2017  . Diabetes mellitus without complication ([place]) 50/27/7412  . Pancreatitis 03/21/2017  . Poor venous access 03/21/2017  . Vasculitis ([place]) 08/03/2016  . Acute maculopapular rash 04/21/2016  . Maculopapular rash, generalized 04/20/2016  . Unstable angina ([place]) 09/28/2015    Past Surgical History:  Procedure Laterality Date  .  ABDOMINAL HYSTERECTOMY    . APPENDECTOMY    . BLADDER SURGERY     mesh removed. revision which has caused everything to fall again  . BOWEL RESECTION     took a piece of bowel out after pancreatectomy caused problem with bowel.   Marland Kitchen BREAST BIOPSY Left 1989   benign  . CARDIAC CATHETERIZATION  1700,1749, 1998   no stents, fatty streaks  . CHOLECYSTECTOMY    . CYST REMOVAL HAND Left 1992   Ganglion cyst removed from Left Wrist  . CYSTECTOMY    . DILATATION & CURETTAGE/HYSTEROSCOPY WITH MYOSURE    . ERCP    . ESOPHAGOGASTRODUODENOSCOPY    . KNEE ARTHROSCOPY Left 1992  . LIVER BIOPSY    . PANCREAS SURGERY  2006   partial pancreatectomy after endoscope  knicked a part of pancreas.   Marland Kitchen PORTA CATH INSERTION N/A 04/10/2017   Procedure: PORTA CATH INSERTION;  Surgeon: Algernon Huxley, MD;  Location: [place] [facility];  Service: Cardiovascular;  Laterality: N/A;  . REPAIR OF ESOPHAGUS  2012   3 clamps placed in esophagus  . ROBOTIC ASSISTED LAPAROSCOPIC VENTRAL/INCISIONAL HERNIA REPAIR N/A 03/15/2018   Procedure: ROBOTIC ASSISTED LAPAROSCOPIC VENTRAL HERNIA REPAIR;  Surgeon: Jules Husbands, MD;  Location: [facility] ORS;  Service: General;  Laterality: N/A;  . SMALL BOWEL REPAIR    . TONSILLECTOMY      Prior to Admission medications   Medication Sig Start Date End Date Taking? Authorizing Provider  ACIDOPHILUS LACTOBACILLUS PO Take 1 capsule by mouth daily.     [provider]  Calcium Carb-Cholecalciferol (CALCIUM 600/VITAMIN D3 PO) Take 1 tablet by mouth daily.    [provider]  Cholecalciferol (VITAMIN D-3) 5000 units TABS Take 5,000 Units by mouth daily.    [provider]  colchicine 0.6 MG tablet 1 tab daily, start 03/02 04/16/18   [provider]  cyanocobalamin 2000 MCG tablet Take 2,000 mcg by mouth daily.    [provider]  Dexlansoprazole 30 MG capsule Take 30 mg by mouth daily.    [provider]  gabapentin (NEURONTIN) 300 MG capsule Take 2 capsules (600 mg total) by mouth 3 (three) times daily. 03/16/18   Tylene Fantasia, PA-C  hydrochlorothiazide (HYDRODIURIL) 12.5 MG tablet Take 12.5 mg by mouth daily as needed (for fluid).     [provider]  HYDROcodone-acetaminophen (NORCO) 5-325 MG tablet Take 1 tablet by mouth every 6 (six) hours as needed for moderate pain. 03/28/18   Pabon, Diego F, MD  lipase/protease/amylase (CREON) 36000 UNITS CPEP capsule Take 108,000 Units by mouth 3 (three) times daily before meals.    [provider]  lisinopril (PRINIVIL,ZESTRIL) 40 MG tablet Take 40 mg by mouth daily.    [provider]  lubiprostone (AMITIZA) 24 MCG capsule  Take 24 mcg by mouth 2 (two) times daily with a meal.    [provider]  Magnesium (V-R MAGNESIUM) 250 MG TABS Take 250 mg by mouth daily.    [provider]  NOVOLOG 100 UNIT/ML injection See admin instructions. Uses via insulin pump sliding scale    [provider]  potassium chloride (K-DUR,KLOR-CON) 10 MEQ tablet ONE TABLET TWICE A DAY FOR 7 DAYS 04/10/18   [provider]  promethazine (PHENERGAN) 12.5 MG tablet Take 12.5 mg by mouth every 6 (six) hours as needed for nausea or vomiting.    [provider]  Simethicone 20 MG/0.3ML SUSP Take 0.3 mLs (20 mg total)  by mouth 3 (three) times daily as needed for up to 7 days. 07/25/18 08/01/18  Laban Emperor, PA-C  vitamin E 400 UNIT capsule Take 400 Units by mouth daily.    [provider]    Allergies Demerol [meperidine], Dilaudid [hydromorphone hcl], Metoclopramide, Morphine and related, Morpholine salicylate, Isosorbide nitrate, Sulfa antibiotics, Adhesive [tape], Darvon [propoxyphene], Flexeril [cyclobenzaprine], Levaquin [levofloxacin in d5w], Naproxen, Soma [carisoprodol], and Zofran [ondansetron hcl]  Family History  Problem Relation Age of Onset  . Cirrhosis Mother   . Colon cancer Mother 36       TAH/BSO in ~50; deceased at 54  . Hypertension Mother   . Breast cancer Mother 48       unconfirmed  . Hypertension Father   . Prostate cancer Father 73       currently 56  . Other Father        liver disease  . Breast cancer Maternal Aunt        age at dx unk.; currently 83  . Cervical cancer Maternal Aunt   . Cancer Maternal Uncle        unk. type; currently 67s  . Stomach cancer Paternal Aunt 42       deceased at 43  . Breast cancer Maternal Grandmother 18       possibly bilateral in 75s; deceased 38  . Non-Hodgkin's lymphoma Daughter        unconfirmed; currently 86  . Cancer Maternal Aunt        hematologic malignancy; deceased 1    Social History Social History    Tobacco Use  . Smoking status: Never Smoker  . Smokeless tobacco: Never Used  Substance Use Topics  . Alcohol use: Yes    Comment: rarely  . Drug use: No      Review of Systems Constitutional: No fever/chills, positive high sugars Eyes: No visual changes. ENT: No sore throat. Cardiovascular: Denies chest pain. Respiratory: Denies shortness of breath. Gastrointestinal: No abdominal pain.  No nausea, no vomiting.  No diarrhea.  No constipation. Genitourinary: Negative for dysuria. Musculoskeletal: Negative for back pain. Skin: Negative for rash. Neurological: Negative for headaches, focal weakness or numbness. All other ROS negative ____________________________________________   PHYSICAL EXAM:  VITAL SIGNS: ED Triage Vitals [09/23/18 1757]  Enc Vitals Group     BP (!) 168/89     Pulse Rate (!) 115     Resp 16     Temp 98.6 F (37 C)     Temp Source Oral     SpO2 95 %     Weight 152 lb (68.9 kg)     Height 5' 4"  (1.626 m)     Head Circumference      Peak Flow      Pain Score 7     Pain Loc      Pain Edu?      Excl. in [place]?     Constitutional: Alert and oriented. Well appearing and in no acute distress. Eyes: Conjunctivae are normal. EOMI. Head: Atraumatic. Nose: No congestion/rhinnorhea. Mouth/Throat: Mucous membranes are moist.   Neck: No stridor. Trachea Midline. FROM Cardiovascular: tachycardiac regular rhythm. Grossly normal heart sounds.  Good peripheral circulation. Respiratory: Normal respiratory effort.  No retractions. Lungs CTAB. Gastrointestinal: Soft and nontender. No distention. No abdominal bruits.  Musculoskeletal: No lower extremity tenderness nor edema.  No joint effusions. Neurologic:  Normal speech and language. No gross focal neurologic deficits are appreciated.  Skin:  Skin is warm, dry and intact.  No rash noted. Psychiatric: Mood and affect are normal. Speech and behavior are normal. However upset about the stress with her son in law.   GU: Deferred   ____________________________________________   LABS (all labs ordered are listed, but only abnormal results are displayed)  Labs Reviewed  GLUCOSE, CAPILLARY - Abnormal; Notable for the following components:      Result Value   Glucose-Capillary >600 (*)    All other components within normal limits  BASIC METABOLIC PANEL - Abnormal; Notable for the following components:   Sodium 133 (*)    Chloride 95 (*)    Glucose, Bld 631 (*)    BUN 21 (*)    Creatinine, Ser 1.07 (*)    Calcium 8.5 (*)    GFR calc non Af Amer 59 (*)    All other components within normal limits  BETA-HYDROXYBUTYRIC ACID - Abnormal; Notable for the following components:   Beta-Hydroxybutyric Acid 0.42 (*)    All other components within normal limits  BLOOD GAS, VENOUS - Abnormal; Notable for the following components:   pO2, Ven 49.0 (*)    Bicarbonate 31.2 (*)    Acid-Base Excess 5.8 (*)    All other components within normal limits  SARS CORONAVIRUS 2 (HOSPITAL ORDER, [facility])  CBC  URINALYSIS, COMPLETE (UACMP) WITH MICROSCOPIC  URINALYSIS, ROUTINE W REFLEX MICROSCOPIC  CBG MONITORING, ED   ____________________________________________   ED ECG REPORT I, Vanessa Paradise, the attending physician, personally viewed and interpreted this ECG.  EKG is sinus tachycardia rate of 106, no ST elevation, no T wave inversion, normal intervals ____________________________________________  PROCEDURES  Procedure(s) performed (including Critical Care):  Procedures   ____________________________________________   INITIAL IMPRESSION / ASSESSMENT AND PLAN / ED COURSE  Amber Christensen was evaluated in Emergency Department on 09/23/2018 for the symptoms described in the history of present illness. She was evaluated in the context of the global COVID-19 pandemic, which necessitated consideration that the patient might be at risk for infection with the SARS-CoV-2 virus that  causes COVID-19. Institutional protocols and algorithms that pertain to the evaluation of patients at risk for COVID-19 are in a state of rapid change based on information released by regulatory bodies including the CDC and federal and state organizations. These policies and algorithms were followed during the patient's care in the ED.    Patient is a well-appearing 53 year old who presents for increasing sugars in the setting of recently discontinuing her pump and being switched to subcutaneous insulin.  Patient also with notably increased stress at home secondary to her son-in-law moving in.  Will get labs to evaluate for DKA, electrolyte abnormalities.  Denies symptoms to suggest UTI or abdominal infection.  With coronavirus testing given my concern patient will be need to be admitted inpatient.  White count is normal no evidence of infection.  Labs notable for a glucose of 631.  Beta hydroxybutyrate is slightly elevated at 0.42 however patient does not have an anion gap and is not acidotic.  I discussed with patient and given her hyperglycemia in the setting of coming off of her pump patient felt most comfortable with coming to the hospital overnight to have her insulin further adjusted and to monitor closely.  Discussed with hospital team and will admit patient.      ____________________________________________   FINAL CLINICAL IMPRESSION(S) / ED DIAGNOSES   Final diagnoses:  Hyperglycemia      MEDICATIONS GIVEN DURING THIS VISIT:  Medications  sodium chloride 0.9 %  bolus 1,000 mL (1,000 mLs Intravenous New Bag/Given 09/23/18 1851)  sodium chloride 0.9 % bolus 1,000 mL (1,000 mLs Intravenous New Bag/Given 09/23/18 1851)  acetaminophen (TYLENOL) tablet 650 mg (650 mg Oral Given 09/23/18 1855)     ED Discharge Orders    None       Note:  This document was prepared using Dragon voice recognition software and may include unintentional dictation errors.   Vanessa Plano, MD  09/23/18 2013

## 2018-09-23 NOTE — ED Triage Notes (Signed)
Pt to ED via POV c/o high blood sugar. Pt states that she does not have insulin pump anymore because insurance would not pay for supplies for the pump. Pt states that her MD changed her insulin but she is having trouble controlling her blood sugar. Pt states that today it has been over 600. Pt took 8 units of insulin at 4pm. Pt states that she is under a lot of stress right now.

## 2018-09-23 NOTE — ED Notes (Signed)
Pt informed of need for urine. Will attempt to obtain on her next trip to bathroom.

## 2018-09-23 NOTE — H&P (Signed)
Amber Christensen NAME: Amber Christensen    MR#:  [id number]  DATE OF BIRTH:  [birth year]  DATE OF ADMISSION:  09/23/2018  PRIMARY CARE PHYSICIAN: Glendon Axe, MD   REQUESTING/REFERRING PHYSICIAN: Vanessa Roscoe, MD  CHIEF COMPLAINT:   Chief Complaint  Patient presents with  . Hyperglycemia    HISTORY OF PRESENT ILLNESS: Amber Christensen  is a 53 y.o. female with a known history of diabetes type 2, and multiple medical problems as listed below.  Patient states that she was on insulin pump for 2 years and was doing well however her insurance changed and she had to be taken off insulin pump for the past 1 month and her sugars have been very labile.  She states that they were running 200s recently however today she noticed blood sugars were in the 600s.  Patient arrived in the ED she was given IV fluids with improvement in her blood sugar to the 400s.  Were asked to admit her for further evaluation and treatment.  Patient also complains of swelling of the lower extremity.      PAST MEDICAL HISTORY:   Past Medical History:  Diagnosis Date  . Allergy    many many allergies  . Anemia    in past  . Anginal pain ([employer]) 01/2018   cardiac workup clear  . Anxiety   . 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[place])    pt unaware of this diagnosis  . Bipolar disorder ([place])   . Bladder mass    removed and had tbt. mesh removed  . Blood transfusion without reported diagnosis 1999   received 20 units of blood after tearing esophagus from vomitting so severely after ERCP  . Breast mass    left side biopsy was negative  . Carpal tunnel syndrome    left hand  . Cervical cancer ([place]) 2002  . Chronic kidney disease    cyst on left kidney  . Chronic pancreatitis ([place])    prior to pancreas surgery  . Cirrhosis ([place])   . Clotting disorder ([place])    pt states that sometimes she has difficulty stopping to bleed and other times it is okay  . Depression   . Diabetes  mellitus without complication (HCC)    has insulin pump  . Dyspnea   . Esophagitis   . GERD (gastroesophageal reflux disease)   . Headache    migraines...takes compazine for this  . Heart disease   . 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 Hypertension   . Irritable bowel syndrome   . Kidney mass    just watching. left kidney mass.  . Liver disease    NASH. has had liver biopsies. negative except for NASH  . NASH (nonalcoholic steatohepatitis)   . Pancreatitis   . Stroke ([place]) 11/2017   had tia. no longer needs plavix  . Thyroid disease     PAST SURGICAL HISTORY:  Past Surgical History:  Procedure Laterality Date  . ABDOMINAL HYSTERECTOMY    . APPENDECTOMY    . BLADDER SURGERY     mesh removed. revision which has caused everything to fall again  . BOWEL RESECTION     took a piece of bowel out after pancreatectomy caused problem with bowel.   Marland Kitchen BREAST BIOPSY Left 1989   benign  . CARDIAC CATHETERIZATION  2542,7062, 1998   no stents, fatty streaks  .  CHOLECYSTECTOMY    . CYST REMOVAL HAND Left 1992   Ganglion cyst removed from Left Wrist  . CYSTECTOMY    . DILATATION & CURETTAGE/HYSTEROSCOPY WITH MYOSURE    . ERCP    . ESOPHAGOGASTRODUODENOSCOPY    . KNEE ARTHROSCOPY Left 1992  . LIVER BIOPSY    . PANCREAS SURGERY  2006   partial pancreatectomy after endoscope knicked a part of pancreas.   Marland Kitchen PORTA CATH INSERTION N/A 04/10/2017   Procedure: PORTA CATH INSERTION;  Surgeon: Algernon Huxley, MD;  Location: [place] [facility];  Service: Cardiovascular;  Laterality: N/A;  . REPAIR OF ESOPHAGUS  2012   3 clamps placed in esophagus  . ROBOTIC ASSISTED LAPAROSCOPIC VENTRAL/INCISIONAL HERNIA REPAIR N/A 03/15/2018   Procedure: ROBOTIC ASSISTED LAPAROSCOPIC VENTRAL HERNIA REPAIR;  Surgeon: Jules Husbands, MD;  Location: [facility];  Service: General;  Laterality: N/A;  . SMALL BOWEL REPAIR    . TONSILLECTOMY       SOCIAL HISTORY:  Social History   Tobacco Use  . Smoking status: Never Smoker  . Smokeless tobacco: Never Used  Substance Use Topics  . Alcohol use: Yes    Comment: rarely    FAMILY HISTORY:  Family History  Problem Relation Age of Onset  . Cirrhosis Mother   . Colon cancer Mother 40       TAH/BSO in ~84; deceased at 2  . Hypertension Mother   . Breast cancer Mother 74       unconfirmed  . Hypertension Father   . Prostate cancer Father 60       currently 11  . Other Father        liver disease  . Breast cancer Maternal Aunt        age at dx unk.; currently 21  . Cervical cancer Maternal Aunt   . Cancer Maternal Uncle        unk. type; currently 61s  . Stomach cancer Paternal Aunt 47       deceased at 37  . Breast cancer Maternal Grandmother 62       possibly bilateral in 37s; deceased 50  . Non-Hodgkin's lymphoma Daughter        unconfirmed; currently 16  . Cancer Maternal Aunt        hematologic malignancy; deceased 92    DRUG ALLERGIES:  Allergies  Allergen Reactions  . Demerol [Meperidine] Hives and Other (See Comments)    Pt states that this medication causes cardiac arrest.    . Dilaudid [Hydromorphone Hcl] Nausea And Vomiting and Other (See Comments)    Pt states that this medication causes cardiac arrest.    . Metoclopramide Hives    Breathing issues  . Morphine And Related Anaphylaxis and Other (See Comments)    Pt states that this medication causes cardiac arrest  . Morpholine Salicylate Nausea And Vomiting, Other (See Comments) and Rash    CHF  . Isosorbide Nitrate Other (See Comments)    Headache   . Sulfa Antibiotics Nausea And Vomiting  . Adhesive [Tape] Rash    Blisters skin Paper tape is okay  . Darvon [Propoxyphene] Nausea And Vomiting and Rash  . Flexeril [Cyclobenzaprine] Nausea And Vomiting and Rash  . Levaquin [Levofloxacin In D5w] Nausea And Vomiting and Rash  . Naproxen Rash    Ibuprofen and advil are not a problem  . Soma  [Carisoprodol] Nausea And Vomiting and Rash  . Zofran [Ondansetron Hcl] Nausea And Vomiting and Rash  REVIEW OF SYSTEMS:   CONSTITUTIONAL: No fever, fatigue or weakness.  EYES: No blurred or double vision.  EARS, NOSE, AND THROAT: No tinnitus or ear pain.  RESPIRATORY: No cough, shortness of breath, wheezing or hemoptysis.  CARDIOVASCULAR: No chest pain, orthopnea, edema.  GASTROINTESTINAL: No nausea, vomiting, diarrhea or abdominal pain.  GENITOURINARY: No dysuria, hematuria.  ENDOCRINE: No polyuria, nocturia,  HEMATOLOGY: No anemia, easy bruising or bleeding SKIN: Bilateral lower extremity redness  mUSCULOSKELETAL: No joint pain or arthritis.   NEUROLOGIC: No tingling, numbness, weakness.  PSYCHIATRY: No anxiety or depression.   MEDICATIONS AT HOME:  Prior to Admission medications   Medication Sig Start Date End Date Taking? Authorizing Provider  ACIDOPHILUS LACTOBACILLUS PO Take 1 capsule by mouth daily.     [provider]  Calcium Carb-Cholecalciferol (CALCIUM 600/VITAMIN D3 PO) Take 1 tablet by mouth daily.    [provider]  Cholecalciferol (VITAMIN D-3) 5000 units TABS Take 5,000 Units by mouth daily.    [provider]  colchicine 0.6 MG tablet 1 tab daily, start 03/02 04/16/18   [provider]  cyanocobalamin 2000 MCG tablet Take 2,000 mcg by mouth daily.    [provider]  Dexlansoprazole 30 MG capsule Take 30 mg by mouth daily.    [provider]  gabapentin (NEURONTIN) 300 MG capsule Take 2 capsules (600 mg total) by mouth 3 (three) times daily. 03/16/18   Tylene Fantasia, PA-C  hydrochlorothiazide (HYDRODIURIL) 12.5 MG tablet Take 12.5 mg by mouth daily as needed (for fluid).     [provider]  HYDROcodone-acetaminophen (NORCO) 5-325 MG tablet Take 1 tablet by mouth every 6 (six) hours as needed for moderate pain. 03/28/18   Pabon, Diego F, MD  lipase/protease/amylase (CREON) 36000 UNITS CPEP capsule  Take 108,000 Units by mouth 3 (three) times daily before meals.    [provider]  lisinopril (PRINIVIL,ZESTRIL) 40 MG tablet Take 40 mg by mouth daily.    [provider]  lubiprostone (AMITIZA) 24 MCG capsule Take 24 mcg by mouth 2 (two) times daily with a meal.    [provider]  Magnesium (V-R MAGNESIUM) 250 MG TABS Take 250 mg by mouth daily.    [provider]  NOVOLOG 100 UNIT/ML injection See admin instructions. Uses via insulin pump sliding scale    [provider]  potassium chloride (K-DUR,KLOR-CON) 10 MEQ tablet ONE TABLET TWICE A DAY FOR 7 DAYS 04/10/18   [provider]  promethazine (PHENERGAN) 12.5 MG tablet Take 12.5 mg by mouth every 6 (six) hours as needed for nausea or vomiting.    [provider]  Simethicone 20 MG/0.3ML SUSP Take 0.3 mLs (20 mg total) by mouth 3 (three) times daily as needed for up to 7 days. 07/25/18 08/01/18  Laban Emperor, PA-C  vitamin E 400 UNIT capsule Take 400 Units by mouth daily.    [provider]      PHYSICAL EXAMINATION:   VITAL SIGNS: Blood pressure (!) 157/82, pulse 99, temperature 98.6 F (37 C), temperature source Oral, resp. rate (!) 21, height 5' 4"  (1.626 m), weight 68.9 kg, SpO2 96 %.  GENERAL:  53 y.o.-year-old patient lying in the bed with no acute distress.  EYES: Pupils equal, round, reactive to light and accommodation. No scleral icterus. Extraocular muscles intact.  HEENT: Head atraumatic, normocephalic. Oropharynx and nasopharynx clear.  NECK:  Supple, no jugular venous distention. No thyroid enlargement, no tenderness.  LUNGS: Normal breath sounds bilaterally, no wheezing,  rales,rhonchi or crepitation. No use of accessory muscles of respiration.  CARDIOVASCULAR: S1, S2 normal. No murmurs, rubs, or gallops.  ABDOMEN: Soft, nontender, nondistended. Bowel sounds present. No organomegaly or mass.  EXTREMITIES: Positive pedal edema chronic lower extremity  redness, cyanosis, or clubbing.  NEUROLOGIC: Cranial nerves II through XII are intact. Muscle strength 5/5 in all extremities. Sensation intact. Gait not checked.  PSYCHIATRIC: The patient is alert and oriented x 3.  SKIN: No obvious rash, lesion, or ulcer.   LABORATORY PANEL:   CBC Recent Labs  Lab 09/23/18 1840  WBC 7.4  HGB 13.5  HCT 38.1  PLT 194  MCV 83.7  MCH 29.7  MCHC 35.4  RDW 12.3   ------------------------------------------------------------------------------------------------------------------  Chemistries  Recent Labs  Lab 09/23/18 1840  NA 133*  K 4.3  CL 95*  CO2 26  GLUCOSE 631*  BUN 21*  CREATININE 1.07*  CALCIUM 8.5*   ------------------------------------------------------------------------------------------------------------------ estimated creatinine clearance is 58 mL/min (A) (by C-G formula based on SCr of 1.07 mg/dL (H)). ------------------------------------------------------------------------------------------------------------------ No results for input(s): TSH, T4TOTAL, T3FREE, THYROIDAB in the last 72 hours.  Invalid input(s): FREET3   Coagulation profile No results for input(s): INR, PROTIME in the last 168 hours. ------------------------------------------------------------------------------------------------------------------- No results for input(s): DDIMER in the last 72 hours. -------------------------------------------------------------------------------------------------------------------  Cardiac Enzymes No results for input(s): CKMB, TROPONINI, MYOGLOBIN in the last 168 hours.  Invalid input(s): CK ------------------------------------------------------------------------------------------------------------------ Invalid input(s): POCBNP  ---------------------------------------------------------------------------------------------------------------  Urinalysis    Component Value Date/Time   COLORURINE YELLOW (A) 07/25/2018  1453   APPEARANCEUR CLEAR (A) 07/25/2018 1453   APPEARANCEUR Hazy 01/31/2014 1455   LABSPEC 1.014 07/25/2018 1453   LABSPEC 1.018 01/31/2014 1455   PHURINE 5.0 07/25/2018 1453   GLUCOSEU 50 (A) 07/25/2018 1453   GLUCOSEU >=500 01/31/2014 1455   HGBUR NEGATIVE 07/25/2018 1453   BILIRUBINUR NEGATIVE 07/25/2018 1453   BILIRUBINUR Negative 01/31/2014 1455   KETONESUR NEGATIVE 07/25/2018 1453   PROTEINUR NEGATIVE 07/25/2018 1453   NITRITE NEGATIVE 07/25/2018 1453   LEUKOCYTESUR NEGATIVE 07/25/2018 1453   LEUKOCYTESUR Negative 01/31/2014 1455     RADIOLOGY: No results found.  EKG: Orders placed or performed during the hospital encounter of 09/23/18  . ED EKG  . ED EKG  . EKG 12-Lead  . EKG 12-Lead    IMPRESSION AND PLAN: Patient is a 53 year old with diabetes type 2 with presenting with severe hyperglycemia  1.  Severe hyperglycemia at this point we will give her IV fluids I will increase her Levemir to 35 units.  Place her on sliding scale insulin patient may need pre-meal insulin diabetic coordinator consult will be needed.  MD in the morning may need to touch base with Dr. Elisabeth Cara her primary endocrinologist  2.  Hyponatremia falsely low due to elevated blood sugars  3.  Neuropathy continue gabapentin as taking at home  4.  Hypertension continue HCTZ  5.  Chronic pancreatitis continue home medication  6.  Miscellaneous heparin for DVT prophylaxis    All the records are reviewed and case discussed with ED provider. Management plans discussed with the patient, family and they are in agreement.  CODE STATUS: Code Status History    Date Active Date Inactive Code Status Order ID Comments User Context   11/22/2017 1556 11/24/2017 1945 Full Code [id number]  Nicholes Mango, MD Inpatient   08/03/2016 1709 08/06/2016 1842 Full Code [id number]  Gladstone Lighter, MD ED   04/20/2016 1510 04/22/2016 1935 Full Code [id number]  Fritzi Mandes, MD Inpatient   09/28/2015 [phone]  09/29/2015 2025  Full Code [id number]  Bettey Costa, MD Inpatient   Advance Care Planning Activity       TOTAL TIME TAKING CARE OF THIS PATIENT: 55 minutes.    Dustin Flock M.D on 09/23/2018 at 9:02 PM  Between 7am to 6pm - Pager - [phone]  After 6pm go to [email or web address] - password [email or web address] [facility]  [phone]  CC: Primary care physician; Glendon Axe, MD

## 2018-09-23 NOTE — ED Notes (Signed)
Left chest port accessed at this time. Blood return and flush. Labs drawn. Site covered and date/time

## 2018-09-23 NOTE — ED Notes (Signed)
ED TO INPATIENT HANDOFF REPORT  ED Nurse Name and Phone #: Lelon Frohlich, RN  S Name/Age/Gender Amber Christensen 53 y.o. female Room/Bed: ED25A/ED25A  Code Status   Code Status: Prior  Home/SNF/Other Home Patient oriented to: self, place, time and situation Is this baseline? Yes   Triage Complete: Triage complete  Chief Complaint High blood sugar  Triage Note Pt to ED via POV c/o high blood sugar. Pt states that she does not have insulin pump anymore because insurance would not pay for supplies for the pump. Pt states that her MD changed her insulin but she is having trouble controlling her blood sugar. Pt states that today it has been over 600. Pt took 8 units of insulin at 4pm. Pt states that she is under a lot of stress right now.    Allergies Allergies  Allergen Reactions  . Demerol [Meperidine] Hives and Other (See Comments)    Pt states that this medication causes cardiac arrest.    . Dilaudid [Hydromorphone Hcl] Nausea And Vomiting and Other (See Comments)    Pt states that this medication causes cardiac arrest.    . Metoclopramide Hives    Breathing issues  . Morphine And Related Anaphylaxis and Other (See Comments)    Pt states that this medication causes cardiac arrest  . Morpholine Salicylate Nausea And Vomiting, Other (See Comments) and Rash    CHF  . Isosorbide Nitrate Other (See Comments)    Headache   . Sulfa Antibiotics Nausea And Vomiting  . Adhesive [Tape] Rash    Blisters skin Paper tape is okay  . Darvon [Propoxyphene] Nausea And Vomiting and Rash  . Flexeril [Cyclobenzaprine] Nausea And Vomiting and Rash  . Levaquin [Levofloxacin In D5w] Nausea And Vomiting and Rash  . Naproxen Rash    Ibuprofen and advil are not a problem  . Soma [Carisoprodol] Nausea And Vomiting and Rash  . Zofran [Ondansetron Hcl] Nausea And Vomiting and Rash    Level of Care/Admitting Diagnosis ED Disposition    ED Disposition Condition [facility]: [place] [[id number]]  Level of Care: Med-Surg [16]  Covid Evaluation: Asymptomatic Screening Protocol (No Symptoms)  Diagnosis: Hyperglycemia [564332]  Admitting Physician: Dustin Flock [[id number]]  Attending Physician: Dustin Flock [[id number]]  PT Class (Do Not Modify): Observation [104]  PT Acc Code (Do Not Modify): Observation [10022]       B Medical/Surgery History Past Medical History:  Diagnosis Date  . Allergy    many many allergies  . Anemia    in past  . Anginal pain ([place]) 01/2018   cardiac workup clear  . Anxiety   . 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[place])    pt unaware of this diagnosis  . Bipolar disorder ([place])   . Bladder mass    removed and had tbt. mesh removed  . Blood transfusion without reported diagnosis 1999   received 20 units of blood after tearing esophagus from vomitting so severely after ERCP  . Breast mass    left side biopsy was negative  . Carpal tunnel syndrome    left hand  . Cervical cancer ([place]) 2002  . Chronic kidney disease    cyst on left kidney  . Chronic pancreatitis ([place])    prior to pancreas surgery  . Cirrhosis ([place])   . Clotting disorder ([place])    pt states that sometimes she has difficulty stopping to bleed and other times it is okay  . Depression   . Diabetes  mellitus without complication (HCC)    has insulin pump  . Dyspnea   . Esophagitis   . GERD (gastroesophageal reflux disease)   . Headache    migraines...takes compazine for this  . Heart disease   . 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 Hypertension   . Irritable bowel syndrome   . Kidney mass    just watching. left kidney mass.  . Liver disease    NASH. has had liver biopsies. negative except for NASH  . NASH (nonalcoholic steatohepatitis)   . Pancreatitis   . Stroke ([place]) 11/2017   had tia. no longer needs plavix  . Thyroid disease    Past Surgical History:  Procedure Laterality  Date  . ABDOMINAL HYSTERECTOMY    . APPENDECTOMY    . BLADDER SURGERY     mesh removed. revision which has caused everything to fall again  . BOWEL RESECTION     took a piece of bowel out after pancreatectomy caused problem with bowel.   Marland Kitchen BREAST BIOPSY Left 1989   benign  . CARDIAC CATHETERIZATION  1607,3710, 1998   no stents, fatty streaks  . CHOLECYSTECTOMY    . CYST REMOVAL HAND Left 1992   Ganglion cyst removed from Left Wrist  . CYSTECTOMY    . DILATATION & CURETTAGE/HYSTEROSCOPY WITH MYOSURE    . ERCP    . ESOPHAGOGASTRODUODENOSCOPY    . KNEE ARTHROSCOPY Left 1992  . LIVER BIOPSY    . PANCREAS SURGERY  2006   partial pancreatectomy after endoscope knicked a part of pancreas.   Marland Kitchen PORTA CATH INSERTION N/A 04/10/2017   Procedure: PORTA CATH INSERTION;  Surgeon: Algernon Huxley, MD;  Location: [facility];  Service: Cardiovascular;  Laterality: N/A;  . REPAIR OF ESOPHAGUS  2012   3 clamps placed in esophagus  . ROBOTIC ASSISTED LAPAROSCOPIC VENTRAL/INCISIONAL HERNIA REPAIR N/A 03/15/2018   Procedure: ROBOTIC ASSISTED LAPAROSCOPIC VENTRAL HERNIA REPAIR;  Surgeon: Jules Husbands, MD;  Location: [facility];  Service: General;  Laterality: N/A;  . SMALL BOWEL REPAIR    . TONSILLECTOMY       A IV Location/Drains/Wounds Patient Lines/Drains/Airways Status   Active Line/Drains/Airways    Name:   Placement date:   Placement time:   Site:   Days:   Implanted Port 11/22/17 Left Chest   11/22/17    1412    Chest   305   Incision - 3 Ports Abdomen Left;Upper;Lateral Left;Mid;Lateral Left;Lower;Lateral   03/15/18    0811     192          Intake/Output Last 24 hours No intake or output data in the 24 hours ending 09/23/18 2214  Labs/Imaging Results for orders placed or performed during the hospital encounter of 09/23/18 (from the past 48 hour(s))  Glucose, capillary     Status: Abnormal   Collection Time: 09/23/18  5:57 PM  Result Value Ref Range   Glucose-Capillary >600  (HH) 70 - 99 mg/dL   Comment 1 Notify RN   Blood gas, venous     Status: Abnormal   Collection Time: 09/23/18  6:12 PM  Result Value Ref Range   pH, Ven 7.43 7.250 - 7.430   pCO2, Ven 47 44.0 - 60.0 mmHg   pO2, Ven 49.0 (H) 32.0 - 45.0 mmHg   Bicarbonate 31.2 (H) 20.0 - 28.0 mmol/L   Acid-Base Excess 5.8 (H) 0.0 -  2.0 mmol/L   O2 Saturation 85.4 %   Patient temperature 37.0    Collection site VEIN    Sample type VEIN     Comment: Performed at [facility], [address] [place], [place] [address]  Basic metabolic panel     Status: Abnormal   Collection Time: 09/23/18  6:40 PM  Result Value Ref Range   Sodium 133 (L) 135 - 145 mmol/L   Potassium 4.3 3.5 - 5.1 mmol/L   Chloride 95 (L) 98 - 111 mmol/L   CO2 26 22 - 32 mmol/L   Glucose, Bld 631 (HH) 70 - 99 mg/dL    Comment: CRITICAL RESULT CALLED TO, READ BACK BY AND VERIFIED WITH ANN CALLES @1933  ON 09/23/2018 BY FMW    BUN 21 (H) 6 - 20 mg/dL   Creatinine, Ser 1.07 (H) 0.44 - 1.00 mg/dL   Calcium 8.5 (L) 8.9 - 10.3 mg/dL   GFR calc non Af Amer 59 (L) >60 mL/min   GFR calc Af Amer >60 >60 mL/min   Anion gap 12 5 - 15    Comment: Performed at [facility], [address] [place], [place] [address]  CBC     Status: None   Collection Time: 09/23/18  6:40 PM  Result Value Ref Range   WBC 7.4 4.0 - 10.5 K/uL   RBC 4.55 3.87 - 5.11 MIL/uL   Hemoglobin 13.5 12.0 - 15.0 g/dL   HCT 38.1 36.0 - 46.0 %   MCV 83.7 80.0 - 100.0 fL   MCH 29.7 26.0 - 34.0 pg   MCHC 35.4 30.0 - 36.0 g/dL   RDW 12.3 11.5 - 15.5 %   Platelets 194 150 - 400 K/uL   nRBC 0.0 0.0 - 0.2 %    Comment: Performed at [facility], [address] [place], [place] [address]  Beta-hydroxybutyric acid     Status: Abnormal   Collection Time: 09/23/18  6:40 PM  Result Value Ref Range   Beta-Hydroxybutyric Acid 0.42 (H) 0.05 - 0.27 mmol/L    Comment: Performed at [facility], [address] [place], [place] [address]   SARS Coronavirus 2 [facility] order, Performed in [facility] hospital lab) Nasopharyngeal Nasopharyngeal Swab     Status: None   Collection Time: 09/23/18  6:40 PM   Specimen: Nasopharyngeal Swab  Result Value Ref Range   SARS Coronavirus 2 NEGATIVE NEGATIVE    Comment: (NOTE) If result is NEGATIVE SARS-CoV-2 target nucleic acids are NOT DETECTED. The SARS-CoV-2 RNA is generally detectable in upper and lower  respiratory specimens during the acute phase of infection. The lowest  concentration of SARS-CoV-2 viral copies this assay can detect is 250  copies / mL. A negative result does not preclude SARS-CoV-2 infection  and should not be used as the sole basis for treatment or other  patient management decisions.  A negative result may occur with  improper specimen collection / handling, submission of specimen other  than nasopharyngeal swab, presence of viral mutation(s) within the  areas targeted by this assay, and inadequate number of viral copies  (<250 copies / mL). A negative result must be combined with clinical  observations, patient history, and epidemiological information. If result is POSITIVE SARS-CoV-2 target nucleic acids are DETECTED. The SARS-CoV-2 RNA is generally detectable in upper and lower  respiratory specimens dur ing the acute phase of infection.  Positive  results are indicative of active infection with SARS-CoV-2.  Clinical  correlation with patient  history and other diagnostic information is  necessary to determine patient infection status.  Positive results do  not rule out bacterial infection or co-infection with other viruses. If result is PRESUMPTIVE POSTIVE SARS-CoV-2 nucleic acids MAY BE PRESENT.   A presumptive positive result was obtained on the submitted specimen  and confirmed on repeat testing.  While 2019 novel coronavirus  (SARS-CoV-2) nucleic acids may be present in the submitted sample  additional confirmatory testing may be necessary for  epidemiological  and / or clinical management purposes  to differentiate between  SARS-CoV-2 and other Sarbecovirus currently known to infect humans.  If clinically indicated additional testing with an alternate test  methodology [phone]) is advised. The SARS-CoV-2 RNA is generally  detectable in upper and lower respiratory sp ecimens during the acute  phase of infection. The expected result is Negative. Fact Sheet for Patients:  StrictlyIdeas.no Fact Sheet for Healthcare Providers: BankingDealers.co.za This test is not yet approved or cleared by the [place] FDA and has been authorized for detection and/or diagnosis of SARS-CoV-2 by FDA under an Emergency Use Authorization (EUA).  This EUA will remain in effect (meaning this test can be used) for the duration of the COVID-19 declaration under Section 564(b)(1) of the Act, 21 U.S.C. section 360bbb-3(b)(1), unless the authorization is terminated or revoked sooner. Performed at [facility], [address] [place], [place] [address]   Glucose, capillary     Status: Abnormal   Collection Time: 09/23/18  8:16 PM  Result Value Ref Range   Glucose-Capillary 451 (H) 70 - 99 mg/dL  Urinalysis, Complete w Microscopic     Status: Abnormal   Collection Time: 09/23/18  8:28 PM  Result Value Ref Range   Color, Urine STRAW (A) YELLOW   APPearance CLEAR (A) CLEAR   Specific Gravity, Urine 1.024 1.005 - 1.030   pH 6.0 5.0 - 8.0   Glucose, UA >=500 (A) NEGATIVE mg/dL   Hgb urine dipstick NEGATIVE NEGATIVE   Bilirubin Urine NEGATIVE NEGATIVE   Ketones, ur NEGATIVE NEGATIVE mg/dL   Protein, ur NEGATIVE NEGATIVE mg/dL   Nitrite NEGATIVE NEGATIVE   Leukocytes,Ua NEGATIVE NEGATIVE   RBC / HPF 0-5 0 - 5 RBC/hpf   WBC, UA 0-5 0 - 5 WBC/hpf   Bacteria, UA NONE SEEN NONE SEEN   Squamous Epithelial / LPF 0-5 0 - 5   Mucus PRESENT     Comment: Performed at [facility], [address] [place], [place] [address]   No results found.  Pending Labs Unresulted Labs (From admission, onward)    Start     Ordered   09/23/18 2133  Hemoglobin A1c  Once,   STAT    Comments: To assess prior glycemic control    09/23/18 2133   Signed and Held  CBC  (enoxaparin (LOVENOX)    CrCl >/= 30 ml/min)  Once,   R    Comments: Baseline for enoxaparin therapy IF NOT ALREADY DRAWN.  Notify MD if PLT < 100 K.    Signed and Held   Signed and Held  Creatinine, serum  (enoxaparin (LOVENOX)    CrCl >/= 30 ml/min)  Once,   R    Comments: Baseline for enoxaparin therapy IF NOT ALREADY DRAWN.    Signed and Held   Signed and Held  Creatinine, serum  (enoxaparin (LOVENOX)    CrCl >/= 30 ml/min)  Weekly,   R    Comments: while on enoxaparin therapy    Signed and  Held   Signed and Held  TSH  Once,   R     Signed and Held   Signed and Held  Hemoglobin A1c  Once,   R     Signed and Held   Signed and Held  CBC  Tomorrow morning,   R     Signed and Held   Signed and Held  Basic metabolic panel  Tomorrow morning,   R     Signed and Held          Vitals/Pain Today's Vitals   09/23/18 2100 09/23/18 2130 09/23/18 2145 09/23/18 2200  BP: (!) 155/91 (!) 155/92  (!) 159/85  Pulse: 88 88 90 88  Resp: 16 19 (!) 24 19  Temp:      TempSrc:      SpO2: 95% 95% 97% 96%  Weight:      Height:      PainSc:        Isolation Precautions No active isolations  Medications Medications  insulin aspart (novoLOG) injection 20 Units (has no administration in time range)  insulin detemir (LEVEMIR) injection 35 Units (has no administration in time range)  insulin aspart (novoLOG) injection 0-9 Units (has no administration in time range)  insulin aspart (novoLOG) injection 0-5 Units (has no administration in time range)  sodium chloride 0.9 % bolus 1,000 mL (1,000 mLs Intravenous New Bag/Given 09/23/18 1851)  sodium chloride 0.9 % bolus 1,000 mL (1,000 mLs Intravenous New Bag/Given 09/23/18 1851)   acetaminophen (TYLENOL) tablet 650 mg (650 mg Oral Given 09/23/18 1855)    Mobility walks Low fall risk   Focused Assessments NA   R Recommendations: See Admitting Provider Note  Report given to:   Additional Notes: Recently stopped using insulin pump due to insurance not paying.

## 2018-09-24 DIAGNOSIS — G629 Polyneuropathy, unspecified: Secondary | ICD-10-CM | POA: Diagnosis not present

## 2018-09-24 DIAGNOSIS — E1165 Type 2 diabetes mellitus with hyperglycemia: Secondary | ICD-10-CM | POA: Diagnosis not present

## 2018-09-24 DIAGNOSIS — E871 Hypo-osmolality and hyponatremia: Secondary | ICD-10-CM | POA: Diagnosis not present

## 2018-09-24 DIAGNOSIS — I10 Essential (primary) hypertension: Secondary | ICD-10-CM | POA: Diagnosis not present

## 2018-09-24 LAB — BASIC METABOLIC PANEL
Anion gap: 6 (ref 5–15)
BUN: 17 mg/dL (ref 6–20)
CO2: 28 mmol/L (ref 22–32)
Calcium: 7.7 mg/dL — ABNORMAL LOW (ref 8.9–10.3)
Chloride: 105 mmol/L (ref 98–111)
Creatinine, Ser: 0.64 mg/dL (ref 0.44–1.00)
GFR calc Af Amer: >60 mL/min (ref >60)
GFR calc non Af Amer: >60 mL/min (ref >60)
Glucose, Bld: 257 mg/dL — ABNORMAL HIGH (ref 70–99)
Potassium: 3.8 mmol/L (ref 3.5–5.1)
Sodium: 139 mmol/L (ref 135–145)

## 2018-09-24 LAB — GLUCOSE, CAPILLARY
Glucose-Capillary: 274 mg/dL — ABNORMAL HIGH (ref 70–99)
Glucose-Capillary: 253 mg/dL — ABNORMAL HIGH (ref 70–99)
Glucose-Capillary: 270 mg/dL — ABNORMAL HIGH (ref 70–99)
Glucose-Capillary: 248 mg/dL — ABNORMAL HIGH (ref 70–99)
Glucose-Capillary: 270 mg/dL — ABNORMAL HIGH (ref 70–99)

## 2018-09-24 LAB — CBC
HCT: 33.4 % — ABNORMAL LOW (ref 36.0–46.0)
Hemoglobin: 11.6 g/dL — ABNORMAL LOW (ref 12.0–15.0)
MCH: 29.4 pg (ref 26.0–34.0)
MCHC: 34.7 g/dL (ref 30.0–36.0)
MCV: 84.6 fL (ref 80.0–100.0)
Platelets: 137 10*3/uL — ABNORMAL LOW (ref 150–400)
RBC: 3.95 MIL/uL (ref 3.87–5.11)
RDW: 12.1 % (ref 11.5–15.5)
WBC: 5.6 10*3/uL (ref 4.0–10.5)
nRBC: 0 % (ref 0.0–0.2)

## 2018-09-24 LAB — TSH: TSH: 1.748 u[IU]/mL (ref 0.350–4.500)

## 2018-09-24 LAB — HEMOGLOBIN A1C
Hgb A1c MFr Bld: 9.4 % — ABNORMAL HIGH (ref 4.8–5.6)
Mean Plasma Glucose: 223.08 mg/dL

## 2018-09-24 MED ORDER — LISINOPRIL 20 MG PO TABS
40.0000 mg | ORAL_TABLET | Freq: Every day | ORAL | Status: DC
  Administered 2018-09-25: 40 mg via ORAL
  Filled 2018-09-24: qty 2

## 2018-09-24 MED ORDER — AMLODIPINE BESYLATE 5 MG PO TABS
5.0000 mg | ORAL_TABLET | Freq: Every day | ORAL | Status: DC
  Administered 2018-09-25: 5 mg via ORAL
  Filled 2018-09-24: qty 1

## 2018-09-24 MED ORDER — INSULIN ASPART 100 UNIT/ML ~~LOC~~ SOLN
3.0000 [IU] | Freq: Three times a day (TID) | SUBCUTANEOUS | Status: DC
  Administered 2018-09-24 – 2018-09-25 (×4): 3 [IU] via SUBCUTANEOUS
  Filled 2018-09-24 (×4): qty 1

## 2018-09-24 MED ORDER — HYDROCODONE-ACETAMINOPHEN 5-325 MG PO TABS
1.0000 | ORAL_TABLET | ORAL | Status: DC | PRN
  Administered 2018-09-24 (×2): 1 via ORAL
  Filled 2018-09-24 (×2): qty 1

## 2018-09-24 MED ORDER — TRAMADOL HCL 50 MG PO TABS
50.0000 mg | ORAL_TABLET | Freq: Four times a day (QID) | ORAL | Status: DC | PRN
  Administered 2018-09-24: 50 mg via ORAL
  Filled 2018-09-24: qty 1

## 2018-09-24 MED ORDER — INSULIN DETEMIR 100 UNIT/ML ~~LOC~~ SOLN
15.0000 [IU] | Freq: Every day | SUBCUTANEOUS | Status: DC
  Administered 2018-09-24 – 2018-09-25 (×2): 15 [IU] via SUBCUTANEOUS
  Filled 2018-09-24 (×3): qty 0.15

## 2018-09-24 NOTE — Progress Notes (Signed)
Nausea medication given. Pt stated she hasn' t vomited in over hour.

## 2018-09-24 NOTE — Progress Notes (Addendum)
Inpatient Diabetes Program Recommendations  AACE/ADA: New Consensus Statement on Inpatient Glycemic Control   Target Ranges:  Prepandial:   less than 140 mg/dL      Peak postprandial:   less than 180 mg/dL (1-2 hours)      Critically ill patients:  140 - 180 mg/dL   Results for Amber Christensen, Amber Christensen (MRN [id number]) as of 09/24/2018 09:04  Ref. Range 09/23/2018 17:57 09/23/2018 20:16 09/23/2018 23:18 09/24/2018 04:00 09/24/2018 07:33  Glucose-Capillary Latest Ref Range: 70 - 99 mg/dL >600 (HH) 451 (H) 386 (H) 274 (H) 253 (H)  Results for Amber Christensen, Amber Christensen (MRN [id number]) as of 09/24/2018 09:04  Ref. 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  Insulin - Meal Coverage: Please consider ordering Novolog 3 units TID with meals for meal coverage if patient eats at least 50% of meals.  HgbA1C: A1C 9.4% on 09/23/18 indicating an average glucose of 223 mg/dl over the past 2-3 months.  NOTE: Noted consult. Chart reviewed. Patient sees Dr. Gabriel Carina for DM management and was last seen on 07/25/18. Noted patient use to be on an insulin pump and had to transition over the SQ insulin recently. Noted telephone note on 08/15/18 by Dr. Gabriel Carina that patient was asked to take:  Levemir 15 units QHS, Novolog 1 unit per 15 grams of carbs and Novolog 0-7 units for correction  To cover your SUGARS:  If sugar 130 - 180 Add 1 units If sugar 181 - 230  Add 2 units If sugar 231 - 280 Add 3 units If sugar 281- 330 Add 4 units If sugar 331 - 380 Add 5 units If sugar 381 - 420 Add 6 units If sugar over 421 Add 7 units  Will plan to see patient today.  Addendum 09/24/18@11 :27-Spoke with patient regarding DM and outpatient DM medication regimen. Patient states that she sees Dr. Gabriel Carina for DM management and that she developed DM after a partial pancreatectomy and she only has 20% of her pancreas left. Patient states that she was using a [employer] insulin pump for DM control but due to insurance change she was told she had a copay of $175 to get insulin pump supplies which she could not afford. Patient states that she has Medicaid and Medicare coverage. Due to the issue with getting insulin pump supplies, she was instructed to switch to Levemir and Novolog insulin by Dr. Gabriel Carina. Patient states that she has been taking Levemir 15 units QHS, Novolog for carb coverage and Novolog correction (vial and syringe). Patient states that her glucose was running high in 200's and then up over 500 mg/dl so she came to the hospital. Patient states that she is storing insulin in the refrigerator and that she is injecting insulin in various locations (back of arms, thigh, and abdomen). Patient states that she rotates sites. Patient reports that she has been hurting in her left side of abdomen and she has been under a  lot of stress lately which she feels is contributing to hyperglycemia. Discussed that since she is not using her insulin pump, it is important that she take Levemir consistently for basal needs and Novolog to cover carbohydrates and for correction as directed by Dr. Gabriel Carina. Wrote out insulin instructions for Levemir and Novolog from Dr. Joycie Peek telephone note on 08/15/18 and provided it to the patient. Patient states that she is taking insulin as Dr. Gabriel Carina directed on 08/15/18. Discussed A1C of 9.4% on 09/23/18 and encouraged patient to follow up with Dr. Gabriel Carina so she can  assist with insulin adjustments if needed. Patient reports that she has Levemir, Novolog, insulin syringes, and testing supplies at home. Patient verbalized understanding of information and states that she has no questions at this time.   Thanks, Barnie Alderman, RN, MSN, CDE Diabetes Coordinator Inpatient Diabetes Program [phone] (Team Pager from 8am to 5pm)

## 2018-09-24 NOTE — TOC Progression Note (Signed)
Transition of Care [facility]) - Progression Note    Patient Details  Name: Amber Christensen MRN: [id number] Date of Birth: [birth year]  Transition of Care [facility]) CM/SW Contact  Su Hilt, RN Phone Number: 09/24/2018, 2:17 PM  Clinical Narrative:    Sent referal thru Epic to [place]  Requested [place] services thru [place], spoke to [place], [facility] is unable to accept the patient       Expected Discharge Plan and Services                                                 Social Determinants of Health (SDOH) Interventions    Readmission Risk Interventions No flowsheet data found.

## 2018-09-24 NOTE — Progress Notes (Signed)
[facility] NAME: Juanna Pudlo    MR#:  [id number]  DATE OF BIRTH:  [birth year]  SUBJECTIVE:   Patient presented to the hospital due to uncontrolled diabetes with severe hyperglycemia.  Patient having some intermittent nausea and vomiting.  REVIEW OF SYSTEMS:    Review of Systems  Constitutional: Negative for chills and fever.  HENT: Negative for congestion and tinnitus.   Eyes: Negative for blurred vision and double vision.  Respiratory: Negative for cough, shortness of breath and wheezing.   Cardiovascular: Negative for chest pain, orthopnea and PND.  Gastrointestinal: Positive for nausea and vomiting. Negative for abdominal pain and diarrhea.  Genitourinary: Negative for dysuria and hematuria.  Neurological: Negative for dizziness, sensory change and focal weakness.  All other systems reviewed and are negative.   Nutrition: Heart Healthy/Carb control Tolerating Diet: Yes Tolerating PT: Ambulatory   DRUG ALLERGIES:   Allergies  Allergen Reactions  . Demerol [Meperidine] Hives and Other (See Comments)    Pt states that this medication causes cardiac arrest.    . Dilaudid [Hydromorphone Hcl] Nausea And Vomiting and Other (See Comments)    Pt states that this medication causes cardiac arrest.    . Metoclopramide Hives    Breathing issues  . Morphine And Related Anaphylaxis and Other (See Comments)    Pt states that this medication causes cardiac arrest  . Morpholine Salicylate Nausea And Vomiting, Other (See Comments) and Rash    CHF  . Isosorbide Nitrate Other (See Comments)    Headache   . Sulfa Antibiotics Nausea And Vomiting  . Adhesive [Tape] Rash    Blisters skin Paper tape is okay  . Darvon [Propoxyphene] Nausea And Vomiting and Rash  . Flexeril [Cyclobenzaprine] Nausea And Vomiting and Rash  . Levaquin [Levofloxacin In D5w] Nausea And Vomiting and Rash  . Naproxen Rash    Ibuprofen and advil are not a  problem  . Soma [Carisoprodol] Nausea And Vomiting and Rash  . Zofran [Ondansetron Hcl] Nausea And Vomiting and Rash    VITALS:  Blood pressure (!) 156/98, pulse 77, temperature 98.1 F (36.7 C), temperature source Oral, resp. rate 14, height 5' 4"  (1.626 m), weight 68.9 kg, SpO2 98 %.  PHYSICAL EXAMINATION:   Physical Exam  GENERAL:  53 y.o.-year-old patient lying in bed in no acute distress.  EYES: Pupils equal, round, reactive to light and accommodation. No scleral icterus. Extraocular muscles intact.  HEENT: Head atraumatic, normocephalic. Oropharynx and nasopharynx clear.  NECK:  Supple, no jugular venous distention. No thyroid enlargement, no tenderness.  LUNGS: Normal breath sounds bilaterally, no wheezing, rales, rhonchi. No use of accessory muscles of respiration.  CARDIOVASCULAR: S1, S2 normal. No murmurs, rubs, or gallops.  ABDOMEN: Soft, nontender, nondistended. Bowel sounds present. No organomegaly or mass.  EXTREMITIES: No cyanosis, clubbing or edema b/l.    NEUROLOGIC: Cranial nerves II through XII are intact. No focal Motor or sensory deficits b/l.   PSYCHIATRIC: The patient is alert and oriented x 3.  SKIN: No obvious rash, lesion, or ulcer.    LABORATORY PANEL:   CBC Recent Labs  Lab 09/24/18 0500  WBC 5.6  HGB 11.6*  HCT 33.4*  PLT 137*   ------------------------------------------------------------------------------------------------------------------  Chemistries  Recent Labs  Lab 09/24/18 0500  NA 139  K 3.8  CL 105  CO2 28  GLUCOSE 257*  BUN 17  CREATININE 0.64  CALCIUM 7.7*   ------------------------------------------------------------------------------------------------------------------  Cardiac Enzymes No results  for input(s): TROPONINI in the last 168 hours. ------------------------------------------------------------------------------------------------------------------  RADIOLOGY:  No results found.   ASSESSMENT AND PLAN:    53 year old female with past medical history of diabetes, diabetic neuropathy, GERD, chronic pancreatitis, chronic pain, IBS, essential hypertension who presented to the hospital due to hyperglycemia.  1.  Uncontrolled diabetes with hyperglycemia- patient presented to the hospital with blood sugars greater than 600. -Sugars are improved with IV fluids and insulin.  Seen by diabetes coordinator and continue Levemir, NovoLog with meals with sliding scale insulin coverage. - No evidence of DKA. - A1c is 9.4.    2.  Nausea/vomiting-etiology unclear. - Patient has no abdominal pain, cont. Supportive care with IV fluids, anti-emetics.   3.  Diabetic neuropathy-continue gabapentin.  4.  Chronic pancreatitis-continue Creon supplements.    5.  History of gout-no acute attack.  Continue colchicine.  6.  Essential hypertension-continue Norvasc, lisinopril.  Plan for possible discharge tomorrow if blood sugars are stable and patient's nausea vomiting is resolved.  All the records are reviewed and case discussed with Care Management/Social Worker. Management plans discussed with the patient, family and they are in agreement.  CODE STATUS: Full code  DVT Prophylaxis: Lovenox  TOTAL TIME TAKING CARE OF THIS PATIENT: 30 minutes.   POSSIBLE D/C IN 1-2 DAYS, DEPENDING ON CLINICAL CONDITION.   Henreitta Leber M.D on 09/24/2018 at 2:53 PM  Between 7am to 6pm - Pager - [phone]  After 6pm go to [email or web address] - Proofreader  [facility]  Hospitalists  Office  [phone]  CC: Primary care physician; [name], MD

## 2018-09-24 NOTE — Progress Notes (Signed)
Pt stated she had loose stool and vomiting, writer did not witness. Education given.

## 2018-09-24 NOTE — TOC Progression Note (Signed)
Transition of Care [facility]) - Progression Note    Patient Details  Name: Amber Christensen MRN: [id number] Date of Birth: [birth year]  [facility]) CM/SW Contact  Su Hilt, RN Phone Number: 09/24/2018, 12:05 PM  Clinical Narrative:     I sent the [facility] director an email with information to make a referral for [facility] for Diabetic support.       Expected Discharge Plan and Services                                                 Social Determinants of Health (SDOH) Interventions    Readmission Risk Interventions No flowsheet data found.

## 2018-09-25 DIAGNOSIS — G629 Polyneuropathy, unspecified: Secondary | ICD-10-CM | POA: Diagnosis not present

## 2018-09-25 DIAGNOSIS — E1165 Type 2 diabetes mellitus with hyperglycemia: Secondary | ICD-10-CM | POA: Diagnosis not present

## 2018-09-25 DIAGNOSIS — I10 Essential (primary) hypertension: Secondary | ICD-10-CM | POA: Diagnosis not present

## 2018-09-25 DIAGNOSIS — E871 Hypo-osmolality and hyponatremia: Secondary | ICD-10-CM | POA: Diagnosis not present

## 2018-09-25 LAB — GLUCOSE, CAPILLARY
Glucose-Capillary: 161 mg/dL — ABNORMAL HIGH (ref 70–99)
Glucose-Capillary: 296 mg/dL — ABNORMAL HIGH (ref 70–99)

## 2018-09-25 LAB — HEPATIC FUNCTION PANEL
ALT: 58 U/L — ABNORMAL HIGH (ref 0–44)
AST: 45 U/L — ABNORMAL HIGH (ref 15–41)
Albumin: 3.9 g/dL (ref 3.5–5.0)
Alkaline Phosphatase: 155 U/L — ABNORMAL HIGH (ref 38–126)
Bilirubin, Direct: 0.4 mg/dL — ABNORMAL HIGH (ref 0.0–0.2)
Indirect Bilirubin: 2.9 mg/dL — ABNORMAL HIGH (ref 0.3–0.9)
Total Bilirubin: 3.3 mg/dL — ABNORMAL HIGH (ref 0.3–1.2)
Total Protein: 6.7 g/dL (ref 6.5–8.1)

## 2018-09-25 LAB — LIPASE, BLOOD: Lipase: 21 U/L (ref 11–51)

## 2018-09-25 MED ORDER — HYDROCHLOROTHIAZIDE 12.5 MG PO TABS: 12.5000 mg | ORAL_TABLET | Freq: Every day | ORAL | Status: DC | PRN

## 2018-09-25 MED ORDER — ENSURE MAX PROTEIN PO LIQD
11.0000 [oz_av] | Freq: Two times a day (BID) | ORAL | Status: DC
  Filled 2018-09-25: qty 330

## 2018-09-25 NOTE — Progress Notes (Signed)
Inpatient Diabetes Program Recommendations  AACE/ADA: New Consensus Statement on Inpatient Glycemic Control   Target Ranges:  Prepandial:   less than 140 mg/dL      Peak postprandial:   less than 180 mg/dL (1-2 hours)      Critically ill patients:  140 - 180 mg/dL   Results for Amber Christensen, Amber Christensen (MRN [id number]) as of 09/25/2018 08:54  Ref. 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  HgbA1C: A1C 9.4% on 09/23/18 indicating an average glucose of 223 mg/dl over the past 2-3 months. At time of discharge, recommend patient resume Levemir, Novolog meal coverage, and Novolog correction as prescribed by Dr. Gabriel Carina (noted below).  NOTE: Noted consult. Chart reviewed. Patient sees Dr. Gabriel Carina for DM management and was last seen on 07/25/18. Noted patient use to be on an insulin pump and had to transition over the SQ insulin recently. Noted telephone note on 08/15/18 by Dr. Gabriel Carina that patient was asked to take:  Levemir 15 units QHS, Novolog 1 unit per 15 grams of carbs and Novolog 0-7 units for correction  To cover your SUGARS:  If sugar 130 - 180 Add 1 units If sugar 181 - 230 Add 2 units If sugar 231 - 280 Add 3 units If sugar 281- 330 Add 4 units If sugar 331 - 380 Add 5 units If sugar 381 - 420 Add 6 units If sugar over 421 Add 7 units  Thanks, Barnie Alderman, RN, MSN,  CDE Diabetes Coordinator Inpatient Diabetes Program [phone] (Team Pager from 8am to [place])

## 2018-09-25 NOTE — Progress Notes (Signed)
Patient called out stating she need ice because she fell. Upon arrival to pt room patient found laying in bed with bed alarm activated. Pt stated she was getting up from the toilet and turned to flush and bumped her right knee on the front of the toilet. Pt denies falling and states " I did not fall in the floor" when questioned. Ice pack given to patient per patient request. Pt educated to not get up without assistance in the future. Patient is a moderate risk for falling. This writer will increase risk to a high fall risk at this time and continue to monitor.

## 2018-09-25 NOTE — Care Management Obs Status (Signed)
[facility] [place] NOTIFICATION   Patient Details  Name: Amber Christensen MRN: [id number] Date of Birth: 1965-12-27   Medicare Observation Status Notification Given:  Yes    Su Hilt, RN 09/25/2018, 9:34 AM

## 2018-09-25 NOTE — Progress Notes (Signed)
Initial Nutrition Assessment  DOCUMENTATION CODES:   Not applicable  INTERVENTION:  Provide Ensure Max Protein po BID, each supplement provides 150 kcal and 30 grams of protein.   RD provided "Carbohydrate Counting for People with Diabetes" handout from the Academy of Nutrition and Dietetics. Discussed different food groups and their effects on blood sugar, emphasizing carbohydrate-containing foods. Provided list of carbohydrates and recommended serving sizes of common foods.Discussed importance of controlled and consistent carbohydrate intake throughout the day. Provided examples of ways to balance meals/snacks and encouraged intake of high-fiber, whole grain complex carbohydrates. RD provided "Using Nutrition Labels: Carbohydrates" handout from the Academy of [employer] and Dietetics. Discussed how to read a nutrition label including looking at serving size and servings per container. Reviewed that there are 15 grams of carbohydrate in one carbohydrate choice. Encouraged patient to use chart for range of carbohydrate grams per choice when reading nutrition labels. Teach back method used. Expect good compliance.  NUTRITION DIAGNOSIS:   Inadequate oral intake related to decreased appetite as evidenced by per patient/family report.  GOAL:   Patient will meet greater than or equal to 90% of their needs  MONITOR:   PO intake, Supplement acceptance, Labs, Weight trends, I & O's  REASON FOR ASSESSMENT:   Consult Diet education  ASSESSMENT:   53 year old female with PMHx of HTN, NASH, GERD, chronic pancreatitis s/p partial pancreatectomy in 2006, depression, anxiety, thyroid disease, cirrhosis, IBS, DM with insulin pump, bipolar disorder, hx cervical cancer, CKD admitted with uncontrolled DM, N/V.   Met with patient at bedside. She is known to this RD from a previous admission in 2018. Patient has a hx of pancreatectomy in [place] where she reports approximately 80% of her pancreas was  removed. She experiences EPI and is now back on Creon, which has improved her steatorrhea, though she reports still having some steatorrhea occasionally. She remains on an insulin pump. She works with a CDE who taught her carbohydrate counting and she feels comfortable with that still. Discussed apps patient can use on her phone for more exact carbohydrate counting, especially when eating at a restaurant or away from home. Patient reports that a few days ago she ate a fried fish meal (very high in fat) and developed N/V afterwards. She reports a decreased appetite and intake since then. She reports her appetite had improved some today and at time of RD assessment this morning she had tolerated her breakfast without nausea. She is amenable to trying Ensure Max Protein to help meet calorie/protein needs.  Patient has now returned to her UBW of 150 lbs and has been weight-stable. She is currently 68.9 kg (152 lbs).  Medications reviewed and include: acidophilus, gabapentin, Novolog 0-9 units TID and QHS, Novolog 3 units TID with meals, Levemir 15 units daily, Creon 108000 units TID before meals, lisinopril, pantoprazole, potassium chloride 10 mEq daily, vitamin B12 2000 micrograms daily, vitamin E 400 units daily.  Labs reviewed: CBG 161-296. On 8/9 HgbA1c 9.4.  Patient does not meet criteria for malnutrition at this time.  NUTRITION - FOCUSED PHYSICAL EXAM:    Most Recent Value  Orbital Region  No depletion  Upper Arm Region  No depletion  Thoracic and Lumbar Region  No depletion  Buccal Region  No depletion  Temple Region  No depletion  Clavicle Bone Region  No depletion  Clavicle and Acromion Bone Region  No depletion  Scapular Bone Region  No depletion  Dorsal Hand  No depletion  Patellar Region  No depletion  Anterior Thigh Region  No depletion  Posterior Calf Region  No depletion  Edema (RD Assessment)  None  Hair  Reviewed  Eyes  Reviewed  Mouth  Reviewed  Skin  Reviewed  Nails   Reviewed     Diet Order:   Diet Order            Diet - low sodium heart healthy        Diet Carb Modified        Diet heart healthy/carb modified Room service appropriate? Yes; Fluid consistency: Thin  Diet effective now             EDUCATION NEEDS:   Education needs have been addressed  Skin:  Skin Assessment: Reviewed RN Assessment  Last BM:  09/24/2018 per chart  Height:   Ht Readings from Last 1 Encounters:  09/23/18 5' 4"  (1.626 m)   Weight:   Wt Readings from Last 1 Encounters:  09/23/18 68.9 kg   Ideal Body Weight:  54.5 kg  BMI:  Body mass index is 26.09 kg/m.  Estimated Nutritional Needs:   Kcal:  1700-1900  Protein:  85-95 grams  Fluid:  1.7-1.9 L/day  Willey Blade, MS, RD, LDN Office: [phone] Pager: [phone] After Hours/Weekend Pager: [phone]

## 2018-09-25 NOTE — TOC Progression Note (Signed)
Transition of Care [facility]) - Progression Note    Patient Details  Name: Amber Christensen MRN: [id number] Date of Birth: [birth year]  [facility]) CM/SW Contact  Su Hilt, RN Phone Number: 09/25/2018, 9:07 AM  Clinical Narrative:     Damaris Schooner with Olivia Mackie from [facility], requested a review of this chart to see if qualifies for inpatient.       Expected Discharge Plan and Services           Expected Discharge Date: 09/25/18                                     Social Determinants of Health (SDOH) Interventions    Readmission Risk Interventions No flowsheet data found.

## 2018-09-25 NOTE — Discharge Summary (Signed)
[facility] NAME: Amber Christensen    MR#:  [id number]  DATE OF BIRTH:  1965/09/21  DATE OF ADMISSION:  09/23/2018 ADMITTING PHYSICIAN: Dustin Flock, MD  DATE OF DISCHARGE: 09/25/2018  PRIMARY CARE PHYSICIAN: [name], MD    ADMISSION DIAGNOSIS:  Hyperglycemia [R73.9]  DISCHARGE DIAGNOSIS:  Active Problems:   Hyperglycemia   SECONDARY DIAGNOSIS:   Past Medical History:  Diagnosis Date  . Allergy    many many allergies  . Anemia    in past  . Anginal pain ([place]) 01/2018   cardiac workup clear  . Anxiety   . 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[place])    pt unaware of this diagnosis  . Bipolar disorder ([place])   . Bladder mass    removed and had tbt. mesh removed  . Blood transfusion without reported diagnosis 1999   received 20 units of blood after tearing esophagus from vomitting so severely after ERCP  . Breast mass    left side biopsy was negative  . Carpal tunnel syndrome    left hand  . Cervical cancer ([place]) 2002  . Chronic kidney disease    cyst on left kidney  . Chronic pancreatitis ([place])    prior to pancreas surgery  . Cirrhosis ([place])   . Clotting disorder ([place])    pt states that sometimes she has difficulty stopping to bleed and other times it is okay  . Depression   . Diabetes mellitus without complication (HCC)    has insulin pump  . Dyspnea   . Esophagitis   . GERD (gastroesophageal reflux disease)   . Headache    migraines...takes compazine for this  . Heart disease   . 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 Hypertension   . Irritable bowel syndrome   . Kidney mass    just watching. left kidney mass.  . Liver disease    NASH. has had liver biopsies. negative except for NASH  . NASH (nonalcoholic steatohepatitis)   . Pancreatitis   . Stroke ([place]) 11/2017   had tia. no longer needs plavix  . Thyroid disease     HOSPITAL  COURSE:   53 year old female with past medical history of diabetes, diabetic neuropathy, GERD, chronic pancreatitis, chronic pain, IBS, essential hypertension who presented to the hospital due to hyperglycemia.  1.  Uncontrolled diabetes with hyperglycemia- patient presented to the hospital with blood sugars greater than 600. -Patient apparently was on a insulin pump recently but switched over to schedule insulin by her endocrinologist.  Patient was admitted to the hospital given some IV fluids, placed on insulin and a diabetes coordinator consult obtained. -Blood sugars have significantly improved since admission and are currently stable.  She will be discharged on her Levemir and NovoLog with meals with close follow-up with her endocrinologist Dr. Gabriel Carina.  Pt's A1c was noted to 9.4 while in the hospital.   2.  Nausea/vomiting- due to uncontrolled DM -No evidence of DKA, patient is clinically asymptomatic with supportive care of IV fluids and antiemetics.  She is therefore being discharged home.  3.  Diabetic neuropathy- she will continue gabapentin.  4.  Chronic pancreatitis- she will continue Creon supplements. - lft's and Lipase were Normal prior to discharge.     5.  History of gout-no acute attack.  she will Continue colchicine.  6.  Essential hypertension- she will continue Norvasc, lisinopril.  DISCHARGE CONDITIONS:   Stable.   CONSULTS OBTAINED:    DRUG ALLERGIES:   Allergies  Allergen Reactions  . Demerol [Meperidine] Hives and Other (See Comments)    Pt states that this medication causes cardiac arrest.    . Dilaudid [Hydromorphone Hcl] Nausea And Vomiting and Other (See Comments)    Pt states that this medication causes cardiac arrest.    . Metoclopramide Hives    Breathing issues  . Morphine And Related Anaphylaxis and Other (See Comments)    Pt states that this medication causes cardiac arrest  . Morpholine Salicylate Nausea And Vomiting, Other (See Comments)  and Rash    CHF  . Isosorbide Nitrate Other (See Comments)    Headache   . Sulfa Antibiotics Nausea And Vomiting  . Adhesive [Tape] Rash    Blisters skin Paper tape is okay  . Darvon [Propoxyphene] Nausea And Vomiting and Rash  . Flexeril [Cyclobenzaprine] Nausea And Vomiting and Rash  . Levaquin [Levofloxacin In D5w] Nausea And Vomiting and Rash  . Naproxen Rash    Ibuprofen and advil are not a problem  . Soma [Carisoprodol] Nausea And Vomiting and Rash  . Zofran [Ondansetron Hcl] Nausea And Vomiting and Rash    DISCHARGE MEDICATIONS:   Allergies as of 09/25/2018      Reactions   Demerol [meperidine] Hives, Other (See Comments)   Pt states that this medication causes cardiac arrest.     Dilaudid [hydromorphone Hcl] Nausea And Vomiting, Other (See Comments)   Pt states that this medication causes cardiac arrest.     Metoclopramide Hives   Breathing issues   Morphine And Related Anaphylaxis, Other (See Comments)   Pt states that this medication causes cardiac arrest   Morpholine Salicylate Nausea And Vomiting, Other (See Comments), Rash   CHF   Isosorbide Nitrate Other (See Comments)   Headache   Sulfa Antibiotics Nausea And Vomiting   Adhesive [tape] Rash   Blisters skin Paper tape is okay   Darvon [propoxyphene] Nausea And Vomiting, Rash   Flexeril [cyclobenzaprine] Nausea And Vomiting, Rash   Levaquin [levofloxacin In D5w] Nausea And Vomiting, Rash   Naproxen Rash   Ibuprofen and advil are not a problem   Soma [carisoprodol] Nausea And Vomiting, Rash   Zofran [ondansetron Hcl] Nausea And Vomiting, Rash      Medication List    STOP taking these medications   HYDROcodone-acetaminophen 5-325 MG tablet Commonly known as: Norco     TAKE these medications   ACIDOPHILUS LACTOBACILLUS PO Take 1 capsule by mouth daily.   amLODipine 5 MG tablet Commonly known as: NORVASC Take 5 mg by mouth daily.   CALCIUM 600/VITAMIN D3 PO Take 1 tablet by mouth daily.    colchicine 0.6 MG tablet 1 tab daily, start 03/02   Creon 36000 UNITS Cpep capsule Generic drug: lipase/protease/amylase Take 108,000 Units by mouth 3 (three) times daily before meals.   cyanocobalamin 2000 MCG tablet Take 2,000 mcg by mouth daily.   Dexlansoprazole 30 MG capsule Take 30 mg by mouth daily.   dicyclomine 10 MG capsule Commonly known as: BENTYL Take 10 mg by mouth 3 (three) times daily.   gabapentin 300 MG capsule Commonly known as: NEURONTIN Take 2 capsules (600 mg total) by mouth 3 (three) times daily.   hydrochlorothiazide 12.5 MG tablet Commonly known as: HYDRODIURIL Take 1 tablet (12.5 mg total) by mouth daily as needed (for fluid).   Levemir 100 UNIT/ML injection Generic drug: insulin detemir Inject 15 Units  into the skin at bedtime.   lisinopril 40 MG tablet Commonly known as: ZESTRIL Take 40 mg by mouth daily.   Loratadine 10 MG Caps Take 20 mg by mouth daily.   lubiprostone 24 MCG capsule Commonly known as: AMITIZA Take 24 mcg by mouth 2 (two) times daily with a meal.   NovoLOG 100 UNIT/ML injection Generic drug: insulin aspart Inject 0-7 Units into the skin See admin instructions. Sliding scale   potassium chloride 10 MEQ tablet Commonly known as: K-DUR ONE TABLET TWICE A DAY FOR 7 DAYS   promethazine 12.5 MG tablet Commonly known as: PHENERGAN Take 12.5 mg by mouth every 6 (six) hours as needed for nausea or vomiting.   Simethicone 20 MG/0.3ML Susp Take 0.3 mLs (20 mg total) by mouth 3 (three) times daily as needed for up to 7 days.   V-R MAGNESIUM 250 MG Tabs Generic drug: Magnesium Take 250 mg by mouth daily. Notes to patient: Resume home regimen   Vitamin D-3 125 MCG (5000 UT) Tabs Take 5,000 Units by mouth daily.   vitamin E 400 UNIT capsule Take 400 Units by mouth daily.         DISCHARGE INSTRUCTIONS:   DIET:  Cardiac diet and Diabetic diet  DISCHARGE CONDITION:  Stable  ACTIVITY:  Activity as  tolerated  OXYGEN:  Home Oxygen: No.   Oxygen Delivery: room air  DISCHARGE LOCATION:  home   If you experience worsening of your admission symptoms, develop shortness of breath, life threatening emergency, suicidal or homicidal thoughts you must seek medical attention immediately by calling 911 or calling your MD immediately  if symptoms less severe.  You Must read complete instructions/literature along with all the possible adverse reactions/side effects for all the Medicines you take and that have been prescribed to you. Take any new Medicines after you have completely understood and accpet all the possible adverse reactions/side effects.   Please note  You were cared for by a hospitalist during your hospital stay. If you have any questions about your discharge medications or the care you received while you were in the hospital after you are discharged, you can call the unit and asked to speak with the hospitalist on call if the hospitalist that took care of you is not available. Once you are discharged, your primary care physician will handle any further medical issues. Please note that NO REFILLS for any discharge medications will be authorized once you are discharged, as it is imperative that you return to your primary care physician (or establish a relationship with a primary care physician if you do not have one) for your aftercare needs so that they can reassess your need for medications and monitor your lab values.     Today   Tolerated breakfast well with no nausea or vomiting.  Blood sugars have improved.  Will discharge home today with outpatient follow-up with her endocrinologist.  VITAL SIGNS:  Blood pressure 114/83, pulse 68, temperature 98 F (36.7 C), resp. rate 18, height 5' 4"  (1.626 m), weight 68.9 kg, SpO2 97 %.  I/O:    Intake/Output Summary (Last 24 hours) at 09/25/2018 1354 Last data filed at 09/25/2018 1348 Gross per 24 hour  Intake 720 ml  Output -  Net  720 ml    PHYSICAL EXAMINATION:  GENERAL:  53 y.o.-year-old patient lying in the bed with no acute distress.  EYES: Pupils equal, round, reactive to light and accommodation. No scleral icterus. Extraocular muscles intact.  HEENT: Head atraumatic,  normocephalic. Oropharynx and nasopharynx clear.  NECK:  Supple, no jugular venous distention. No thyroid enlargement, no tenderness.  LUNGS: Normal breath sounds bilaterally, no wheezing, rales,rhonchi. No use of accessory muscles of respiration.  CARDIOVASCULAR: S1, S2 normal. No murmurs, rubs, or gallops.  ABDOMEN: Soft, non-tender, non-distended. Bowel sounds present. No organomegaly or mass.  EXTREMITIES: No pedal edema, cyanosis, or clubbing.  NEUROLOGIC: Cranial nerves II through XII are intact. No focal motor or sensory defecits b/l.  PSYCHIATRIC: The patient is alert and oriented x 3.  SKIN: No obvious rash, lesion, or ulcer.   DATA REVIEW:   CBC Recent Labs  Lab 09/24/18 0500  WBC 5.6  HGB 11.6*  HCT 33.4*  PLT 137*    Chemistries  Recent Labs  Lab 09/24/18 0500 09/25/18 1042  NA 139  --   K 3.8  --   CL 105  --   CO2 28  --   GLUCOSE 257*  --   BUN 17  --   CREATININE 0.64  --   CALCIUM 7.7*  --   AST  --  45*  ALT  --  58*  ALKPHOS  --  155*  BILITOT  --  3.3*    Cardiac Enzymes No results for input(s): TROPONINI in the last 168 hours.  Microbiology Results  Results for orders placed or performed during the hospital encounter of 09/23/18  SARS Coronavirus 2 [facility] order, Performed in [facility] hospital lab) Nasopharyngeal Nasopharyngeal Swab     Status: None   Collection Time: 09/23/18  6:40 PM   Specimen: Nasopharyngeal Swab  Result Value Ref Range Status   SARS Coronavirus 2 NEGATIVE NEGATIVE Final    Comment: (NOTE) If result is NEGATIVE SARS-CoV-2 target nucleic acids are NOT DETECTED. The SARS-CoV-2 RNA is generally detectable in upper and lower  respiratory specimens during the acute phase  of infection. The lowest  concentration of SARS-CoV-2 viral copies this assay can detect is 250  copies / mL. A negative result does not preclude SARS-CoV-2 infection  and should not be used as the sole basis for treatment or other  patient management decisions.  A negative result may occur with  improper specimen collection / handling, submission of specimen other  than nasopharyngeal swab, presence of viral mutation(s) within the  areas targeted by this assay, and inadequate number of viral copies  (<250 copies / mL). A negative result must be combined with clinical  observations, patient history, and epidemiological information. If result is POSITIVE SARS-CoV-2 target nucleic acids are DETECTED. The SARS-CoV-2 RNA is generally detectable in upper and lower  respiratory specimens dur ing the acute phase of infection.  Positive  results are indicative of active infection with SARS-CoV-2.  Clinical  correlation with patient history and other diagnostic information is  necessary to determine patient infection status.  Positive results do  not rule out bacterial infection or co-infection with other viruses. If result is PRESUMPTIVE POSTIVE SARS-CoV-2 nucleic acids MAY BE PRESENT.   A presumptive positive result was obtained on the submitted specimen  and confirmed on repeat testing.  While 2019 novel coronavirus  (SARS-CoV-2) nucleic acids may be present in the submitted sample  additional confirmatory testing may be necessary for epidemiological  and / or clinical management purposes  to differentiate between  SARS-CoV-2 and other Sarbecovirus currently known to infect humans.  If clinically indicated additional testing with an alternate test  methodology [phone]) is advised. The SARS-CoV-2 RNA is generally  detectable in upper  and lower respiratory sp ecimens during the acute  phase of infection. The expected result is Negative. Fact Sheet for Patients:   StrictlyIdeas.no Fact Sheet for Healthcare Providers: BankingDealers.co.za This test is not yet approved or cleared by the [place] FDA and has been authorized for detection and/or diagnosis of SARS-CoV-2 by FDA under an Emergency Use Authorization (EUA).  This EUA will remain in effect (meaning this test can be used) for the duration of the COVID-19 declaration under Section 564(b)(1) of the Act, 21 U.S.C. section 360bbb-3(b)(1), unless the authorization is terminated or revoked sooner. Performed at [facility], [address], [place], [place] [address]     RADIOLOGY:  No results found.    Management plans discussed with the patient, family and they are in agreement.  CODE STATUS:     Code Status Orders  (From admission, onward)         Start     Ordered   09/23/18 2258  Full code  Continuous     09/23/18 2258        TOTAL TIME TAKING CARE OF THIS PATIENT: 40 minutes.    Henreitta Leber M.D on 09/25/2018 at 1:54 PM  Between 7am to 6pm - Pager - [phone]  After 6pm go to [email or web address] - Proofreader  [facility]  Office  [phone]  CC: Primary care physician; [name], MD

## 2018-09-25 NOTE — TOC Transition Note (Signed)
Transition of Care [facility]) - CM/SW Discharge Note   Patient Details  Name: Amber Christensen MRN: [id number] Date of Birth: March 29, 1965  [facility] of [facility] CM/SW Contact:  Su Hilt, RN Phone Number: 09/25/2018, 9:22 AM   Clinical Narrative:    The patient is discharged home with self care, a referral was sent to [facility] for DM support, she has insurance and medicaid that can help with DM supplies and needs.   Final next level of care: Home/Self Care Barriers to Discharge: Barriers Resolved   Patient Goals and CMS Choice        Discharge Placement                       Discharge Plan and Services                                     Social Determinants of Health (SDOH) Interventions     Readmission Risk Interventions No flowsheet data found.

## 2018-09-25 NOTE — Care Management Note (Signed)
UR notifed by [facility] RN, stating that pt was not going to dc today and wanted to see about inpt status.  Stated that she spoke with MB. Owens Shark RN who stated that this pt was [facility].  UR called to Pre-Service center and spoke with Estill Bamberg who stated that pt does have [employer] and not [employer].  UR spoke w/ UR MB. Owens Shark regarding the above.  She stated that she had a secure chat with [facility] RN but not sure where the [facility] coverage came into play as it is a [employer] fast streamer product.  UR called TOC RN stating the above.  TOC RN stated that the pt is now going to be dc'd.  UR MB. Brown notified of dc.

## 2018-09-27 ENCOUNTER — Encounter: Payer: Self-pay | Admitting: *Deleted

## 2018-09-27 ENCOUNTER — Other Ambulatory Visit: Payer: Self-pay | Admitting: *Deleted

## 2018-09-27 NOTE — Patient Outreach (Signed)
[place] [facility] - Inpatient) [place] Telephone Outreach PCP completes Transition of Care follow up post-hospital discharge Post-hospital discharge day # 2  09/27/2018  Amber Christensen [birth year] [id number]  Successful telephone outreach to Amber Christensen, 53 y/o female referred to [place] by inpatient RN CM after recent hospital admission August 9-11, 2020 for hyperglycemia.  Patient experienced high blood sugar at home after her regular insulin therapy was changed by established endocrinologist from insulin pump to injections, due to patient's insurance company no longer covering her insulin pump supplies.  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  HIPAA/ identity verified with patient during phone call today and patient sounds to be in no distress throughout phone call today;  Pleasant 45 minute phone call.  THN CM services discussed with patient who provides verbal consent for [facility] CM involvement in her care post-hospital discharge, and she provides verbal consent for [facility] CM team to speak with her spouse Amber Christensen if needed and provides his phone number as [phone].  Today, patient reports "doing pretty good" post-hospital discharge on September 25, 2018-- stated her "biggest problem now" is trying to deal with her insurance company to get coverage "re-instated" for her insulin pump supplies.  States she has been a diabetic "for a long long time," due to prior partial pancreatectomy and has been using insulin pump since then; states that her insurance provider was changed by medicare last October (2109) to get vision and dental benefits; states this change was made by Medicare/ Medicaid, as her previous insurance provider [employer] (histocompatibility and immunogenetics)) did not provide dental/ vision benefits.  Patient reports she was switched to [facility] which does have vision and dental benefits.  Patient  further states that she had insulin pump supplies at home through [employer] up until she ran out in late June to early July-- when she discovered that these supplies were not covered by [facility]; patient was then prescribed insulin by injection by her endocrinology provider.  Since this change was made, patient stated that her DM "has been out of control."  Patient reports that both she and her endocrinology provider would like for her to resume insulin pump.    Reports previous insulin pump supply company (with [employer]): [employer] Current supplier: "[employer]"- patient states this company "accepts [place], but does not accept my Medicaid"  States that she spent "3 hours on the phone with [facility] and Medicaid yesterday" trying to get this straightened out.  Discussed with patient that I would place [place] team referral to assist with her challenges around getting her insulin pump supplies covered under her benefits and to assist in seeing of she would qualify for a continuous glucose monitoring system. I encouraged patient to fully engage with [place] team once outreach is established.  Patient also did report that she has spoke to "[place]" at [employer], who has agreed to send her a limited quantity of insulin pump samples, which she is expecting to arrive soon, and is very happy about, however, she states that these supplies will limted and will not "last long.".  Patient further reports:  Medications: -- Has all medicationsand takes as prescribed;denies questions about current medications.  -- Verbalizes good general understanding of the purpose, dosing, and scheduling of medications.   -- self-manage medications-- takes directly out of prescription bottles -- denies issues with swallowing medications- breaks large pills in half and is able to swallow once broken  in two -- patient was recently discharged from the hospital and all medications were thoroughly reviewed with patient today during phone call;  no concerns identified at time of medication review; medication list in EMR updated according to patient report today  Provider appointments: -- All upcoming provider appointments were reviewed with patient today- patient verbalizes accurate understanding of all scheduled appointments and verbalizes plans to attend all ---- Endocrinology provider office visit tomorrow 09/28/2018- Dr. Gabriel Carina ---- PCP office visit Monday 10/01/2018- Dr. Ginette Pitman ---- outpatient colonoscopy procedure scheduled for Monday 10/08/2018  Safety/ Mobility/ Falls: -- reports fall at hospital during most recent hospital readmission; reports one other fall at home "months ago," stating she doesn't know why she fell; denies injuries with either fall. -- assistive devices: none -- reports occasional episodes of dizziness of unknown cause -- general fall risks/ prevention education discussed with patient today  Social/ Community Resource needs: -- reports that her step son and grandchildren have recently moved in with she and her husband- reports needing additional financial resources for food due to this change; stated that she is working on obtaining food stamps "through her step-son" for the family to help with his; states she contacted DSS around obtaining food stamps- states an application has been put on hold, until her step son is able to "do his part" to complete/ submit the application; states that the DSS person she has spoken to provided a resource for a Advice worker" -- patient provides transportation for herself to all provider office visits and errands- denies transportation issues; states stepson will provide transportation to upcoming scheduled outpatient colonoscopy -- SDOH completed for: transportation/ food insecurity/ financial resource strain -- discussed with patient that I would place [place] CSW team referral for follow up on community resources for food insecurity, encouraged patient to fully engage with  [facility] CSW team once outreach is established, and patient verbalizes agreement  Advanced Directive (AD) Planning:   --reports "not sure" whether she currently has exisisting AD in place.  Basics of Advanced directive planing discussed with patient and she is agreeable to my sending her printed educational material through mail; address verified -- endorses desire to be "full resuscitation/ code" status  Self-health management of DM: -- patient reports good blood sugar control when she had her insulin pump; states "terrible control" since she has been injecting long- and short-acting insulin -- would like to obtain continuous glucose monitoring system- not sure of this is covered under her insurance benefit plan or if she would qualify -- reviewed with patient her A1-C levels over previous year: patient has a very good understanding of the significance of A1-C values, and she accurately reports her most recent and previous A1-C values (A1C 9.4 on 09/23/2018) -- currently monitors blood sugars 4 times per day; again patient is very interested in obtaining a continuous glucose monitoring system; does not record blood sugars on paper- retrieves from glucometer and takes glucometer to all provider appointments -- reports blood sugars continue running "between 200-300" for all readings, post-most recent hospital discharge; looking forward to endocrinology appointment tomorrow  Patient denies further issues, concerns, or problems today.  I provided/ confirmed that patient has my direct phone number, the main THN CM office phone number, and the [facility] CM 24-hour nurse advice phone number should issues arise prior to next scheduled [place] outreach, which we scheduled in 2 weeks around patient's preference, post- upcomming provider appointments and outpatient colonoscopy.  Encouraged patient to contact me directly if needs, questions,  issues, or concerns arise prior to next scheduled outreach; patient agreed to  do so.  Plan:  Patient will take medications as prescribed and will attend all scheduled provider appointments  Patient will promptly notify care providers for any new concerns/ issues/ problems that arise  Patient will continue monitoring blood sugar readings at home 4 times per day  Patient will engage with [place] and [place] teams around stated need for medication assistance and food insecrity   I will make patient's PCP aware of [place] RN CM involvement in patient's care-- will send barriers letter  I will send patient [facility] CM patient welcome letter and packet with 24-hour nurse advice line magnet  I will send patient printed educational material on Advanced Directives, with planning packet and on fall prevention, and patient will promptly review for further discussion  [place] outreach to continue with scheduled phone call in 2 weeks, post-upcoming provider appointments  [facility] CM Care Plan Problem One     Most Recent Value  Care Plan Problem One  High Risk for hospital readmission related to/ as evidenced by recent hospitalization August 9-11, 2020 for hyperglycemia  Role Documenting the Problem One  Care Management Coordinator  Care Plan for Problem One  Active  THN Long Term Goal   Over the next 31 days, patient will not experience hospital readmisison, as evidenced by patient reporting and review of EMR during [facility] RN CM outreach  [facility] Long Term Goal Start Date  09/27/18  Interventions for Problem One Long Term Goal  [facility] CM program inititiated and hospital discharge instructions reviewed with patient,  discussed patient's current clinical condition and confirmed that she does not have current clinical concerns,  reviewed upcoming provider appointments with patient and confirmed that she has reliable transportation to scheduled appointments and plans to attend all as scheduled  THN CM Short Term Goal #1   Over the next 30 days, patient will enage with [place] team  to address her issues with obtaining insulin pump supplies and explore possibility of obtaining a continuous glucose monitoring system, as evidenced by patient reporting and collaboration with [place] team as indicated during [facility] RN CM outreach  [facility] CM Short Term Goal #1 Start Date  09/27/18  Interventions for Short Term Goal #1  Medication review completed with patient today,  medication list updated according to patient report,  placed [place] team referral to address patient's concerns around obtaining insulin pump supplies and continuous glucose monitoring system  THN CM Short Term Goal #2   Over the next 30 days, patient will engage with [facility] CSW team to address her concerns with food insecurity, as evidenced by patient reporting and collaboration with [facility] CSW team as indicated during [facility] RN CM outreach  [facility] CM Short Term Goal #2 Start Date  09/27/18  Interventions for Short Term Goal #2  Completed SDOH for food insecurity and placed [facility] CSW referral to address patient's concerns around food insecurity,  encouraged patient's active engagement with [facility] CSW team    [facility] CM Care Plan Problem Two     Most Recent Value  Care Plan Problem Two  Self-health management of chronic disease state of DM, as evidenced by patient self- reporting  Role Documenting the Problem Two  Care Management Coordinator  Care Plan for Problem Two  Active  Interventions for Problem Two Long Term Goal   Discussed patient's current and previous blood sugar monitoring with her and her challenges since she has  not been using insulin pump,  reviewed post-hospital discharge blood sugars with patient and confirmed that patient will attend scheduled endocrinology office visit tomorrow and that she has reliable transportation to visit  [place] Goal  Over the next 60 days, patient will continue monitoring blood sugars at home 4 x per day as evidenced by review of same with patient during [place] Term  Goal Start Date  09/27/18     I appreciate the opportunity to participate in [place] care,  [name], RN, BSN, [employer] Coordinator [facility] Care Management  [phone]

## 2018-09-28 ENCOUNTER — Other Ambulatory Visit: Payer: Self-pay

## 2018-09-28 DIAGNOSIS — E139 Other specified diabetes mellitus without complications: Secondary | ICD-10-CM | POA: Diagnosis not present

## 2018-09-28 NOTE — Patient Outreach (Signed)
[place] [facility]) Care Management  09/28/2018  Amber Christensen April 18, 1965 [id number]   Successful outreach to patient today regarding social work referral for food and financial resources, however, patient was unable to speak due to MD appointment.  Will call her back next week.   Ronn Melena, BSW Social Worker [phone]

## 2018-10-01 ENCOUNTER — Other Ambulatory Visit: Payer: Self-pay

## 2018-10-01 ENCOUNTER — Other Ambulatory Visit: Payer: Self-pay | Admitting: Pharmacist

## 2018-10-01 DIAGNOSIS — E1140 Type 2 diabetes mellitus with diabetic neuropathy, unspecified: Secondary | ICD-10-CM | POA: Diagnosis not present

## 2018-10-01 DIAGNOSIS — I10 Essential (primary) hypertension: Secondary | ICD-10-CM | POA: Diagnosis not present

## 2018-10-01 DIAGNOSIS — R194 Change in bowel habit: Secondary | ICD-10-CM | POA: Diagnosis not present

## 2018-10-01 DIAGNOSIS — Z79899 Other long term (current) drug therapy: Secondary | ICD-10-CM | POA: Diagnosis not present

## 2018-10-01 DIAGNOSIS — K861 Other chronic pancreatitis: Secondary | ICD-10-CM | POA: Diagnosis not present

## 2018-10-01 DIAGNOSIS — Z09 Encounter for follow-up examination after completed treatment for conditions other than malignant neoplasm: Secondary | ICD-10-CM | POA: Diagnosis not present

## 2018-10-01 DIAGNOSIS — K210 Gastro-esophageal reflux disease with esophagitis: Secondary | ICD-10-CM | POA: Diagnosis not present

## 2018-10-01 DIAGNOSIS — E139 Other specified diabetes mellitus without complications: Secondary | ICD-10-CM | POA: Diagnosis not present

## 2018-10-01 DIAGNOSIS — R7989 Other specified abnormal findings of blood chemistry: Secondary | ICD-10-CM | POA: Diagnosis not present

## 2018-10-01 NOTE — Patient Outreach (Signed)
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     -Per notes, patient switched from insulin pump to injections due to inability to obtain pump supplies after switching Medicare Part D plans in 2020, has to use [employer] as distributor but [employer] does not take Medicaid.   -Has [employer] and [employer] representatives assisting her -[employer] rep is going to send patient temporary insulin pump supplies -Endocrinologist has provided patient with insulin instructions for either pump or injections  Discussed patient referral with [place] manager who will contact [place] pharmacist representative for guidance on how to assist patient with obtaining diabetic pump supplies.    Call placed to Dr. Joycie Peek office to request CGM initiation.    Plan: F/u on instructions from [facility] later this week  Ralene Bathe, PharmD, [place] [phone]

## 2018-10-01 NOTE — Patient Outreach (Signed)
[place] [facility]) Care Management  10/01/2018  [place] [birth year] [id number]   Successful outreach to patient regarding social work referral for food and financial resources.  BSW introduced self and reason for call. Patient stated "we got what we needed" since her conversation with RNCM, Reginia Naas.  She was able to locate food resource that can be used regularly and an appointment has been scheduled at [place] in order to apply for Food and Nutrition Services.  Patient also stated that her step son was able to get a job which will be a significant financial help. She declined additional assistance at this time.  BSW closing case but did encourage her to call if additional needs arise.  Ronn Melena, BSW Social Worker [phone]

## 2018-10-03 ENCOUNTER — Ambulatory Visit: Payer: Self-pay | Admitting: Pharmacist

## 2018-10-03 ENCOUNTER — Other Ambulatory Visit: Payer: Self-pay | Admitting: Pharmacist

## 2018-10-03 ENCOUNTER — Other Ambulatory Visit: Payer: Self-pay | Admitting: *Deleted

## 2018-10-03 NOTE — Patient Outreach (Signed)
[facility] [facility]) Care Management  [place] 10/03/2018  [place] 1965/09/15 [id number]  Incoming call received from patient. HIPAA identifiers verified. Patient reports she was given my phone number by [facility] RN and told to call for update on her pump supplies.  I informed patient that [facility] had reached out to [facility] pharmacist on Monday, August 17th, but have not heard back yet.  Patient stated she was on the phone for more than 2 hours yesterday with conference call between Medicaid, [place], and [employer] and was told the problem was fixed due to incorrect Medicaid number but unfortunately, claims are still not going through [facility] today for pump supplies.  [employer] stated they do not have contract with her particular [facility] D-SNP plan.  [employer] and [employer] will need to coordinate benefits for patient.  I am unable to assist any further at this point but will wait for a response from [facility] pharmacist.    Patient reports she is going to switch plans in October to find a Medicare / Medicaid plan that is easier to work with.  I recommended that she reach out to [facility] for counseling on plans that will work for her medications and pump supplies.  Provided patient with phone number to [facility].    Message from endocrinology office stating as soon as pump supplies issue is resolved, they will order CGM machine that will work with pump.   Plan: Will continue to await response from [facility].  Will f/u again early next week.   Ralene Bathe, PharmD, [place] [phone]

## 2018-10-03 NOTE — Patient Outreach (Signed)
[place] [facility]) Care Management [place] Telephone Outreach Care Coordination  10/03/2018  AKIRAH STORCK [birth year] [id number]  Successful incoming telephone call from Amber Christensen, 53 y/o female referred to [place] by inpatient RN CM after recent hospital admission August 9-11, 2020 for hyperglycemia.  Patient experienced high blood sugar at home after her regular insulin therapy was changed by established endocrinologist from insulin pump to injections, due to patient's insurance company no longer covering her insulin pump supplies.  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  HIPAA/ identity verified with patient during phone call today and patient sounds to be in no distress throughout phone call today; patient reports that she has spoken to the suppliers for her insulin pump supplies, as well as to Medicaid.  Reports that it was discovered that her medicaid ID number was listed incorrectly; stated that this was corrected, however, Medicaid is still insisting that she does not have coverage for her supplies.  Patient wanted to share this information with me and asked for [facility] Pharmacist phone number who is working with her to resolve this issue.  I provided patient with update per [place] note from phone call to patient on 10/01/2018, as well as the direct phone number to [facility] Pharmacist who is involved in patient's care to resolve her medication coverage concerns; patient stated she would contact [facility] Pharmacist to follow up.  Plan:  [place] Outreach to continue as scheduled  Will continue collaborating with [place] team to assist as indicated  [name], RN, BSN, [place] Care Management  [phone]

## 2018-10-04 ENCOUNTER — Other Ambulatory Visit: Payer: Self-pay

## 2018-10-04 ENCOUNTER — Other Ambulatory Visit
Admission: RE | Admit: 2018-10-04 | Discharge: 2018-10-04 | Disposition: A | Discharge status: Home / Self Care | Payer: Medicare HMO | Source: Ambulatory Visit | Attending: Gastroenterology | Admitting: Gastroenterology

## 2018-10-04 DIAGNOSIS — Z20828 Contact with and (suspected) exposure to other viral communicable diseases: Secondary | ICD-10-CM | POA: Diagnosis not present

## 2018-10-04 DIAGNOSIS — Z01812 Encounter for preprocedural laboratory examination: Secondary | ICD-10-CM | POA: Insufficient documentation

## 2018-10-04 LAB — SARS CORONAVIRUS 2 (TAT 6-24 HRS): SARS Coronavirus 2: NEGATIVE

## 2018-10-05 ENCOUNTER — Encounter: Payer: Self-pay | Admitting: *Deleted

## 2018-10-08 ENCOUNTER — Ambulatory Visit: Payer: Medicare HMO | Admitting: Certified Registered Nurse Anesthetist

## 2018-10-08 ENCOUNTER — Other Ambulatory Visit: Payer: Self-pay

## 2018-10-08 ENCOUNTER — Encounter
Admission: RE | Disposition: A | Discharge status: Home / Self Care | Payer: Self-pay | Source: Home / Self Care | Attending: Gastroenterology

## 2018-10-08 ENCOUNTER — Encounter: Payer: Self-pay | Admitting: *Deleted

## 2018-10-08 ENCOUNTER — Ambulatory Visit
Admission: RE | Admit: 2018-10-08 | Discharge: 2018-10-08 | Disposition: A | Discharge status: Home / Self Care | Payer: Medicare HMO | Attending: Gastroenterology | Admitting: Gastroenterology

## 2018-10-08 DIAGNOSIS — Z888 Allergy status to other drugs, medicaments and biological substances status: Secondary | ICD-10-CM | POA: Insufficient documentation

## 2018-10-08 DIAGNOSIS — R0600 Dyspnea, unspecified: Secondary | ICD-10-CM | POA: Insufficient documentation

## 2018-10-08 DIAGNOSIS — Z881 Allergy status to other antibiotic agents status: Secondary | ICD-10-CM | POA: Insufficient documentation

## 2018-10-08 DIAGNOSIS — Z885 Allergy status to narcotic agent status: Secondary | ICD-10-CM | POA: Insufficient documentation

## 2018-10-08 DIAGNOSIS — K640 First degree hemorrhoids: Secondary | ICD-10-CM | POA: Insufficient documentation

## 2018-10-08 DIAGNOSIS — N281 Cyst of kidney, acquired: Secondary | ICD-10-CM | POA: Diagnosis not present

## 2018-10-08 DIAGNOSIS — D689 Coagulation defect, unspecified: Secondary | ICD-10-CM | POA: Insufficient documentation

## 2018-10-08 DIAGNOSIS — I129 Hypertensive chronic kidney disease with stage 1 through stage 4 chronic kidney disease, or unspecified chronic kidney disease: Secondary | ICD-10-CM | POA: Diagnosis not present

## 2018-10-08 DIAGNOSIS — Z8541 Personal history of malignant neoplasm of cervix uteri: Secondary | ICD-10-CM | POA: Diagnosis not present

## 2018-10-08 DIAGNOSIS — F319 Bipolar disorder, unspecified: Secondary | ICD-10-CM | POA: Diagnosis not present

## 2018-10-08 DIAGNOSIS — F419 Anxiety disorder, unspecified: Secondary | ICD-10-CM | POA: Diagnosis not present

## 2018-10-08 DIAGNOSIS — K5790 Diverticulosis of intestine, part unspecified, without perforation or abscess without bleeding: Secondary | ICD-10-CM | POA: Diagnosis not present

## 2018-10-08 DIAGNOSIS — Z980 Intestinal bypass and anastomosis status: Secondary | ICD-10-CM | POA: Insufficient documentation

## 2018-10-08 DIAGNOSIS — Z79899 Other long term (current) drug therapy: Secondary | ICD-10-CM | POA: Diagnosis not present

## 2018-10-08 DIAGNOSIS — Z8371 Family history of colonic polyps: Secondary | ICD-10-CM | POA: Diagnosis not present

## 2018-10-08 DIAGNOSIS — G5600 Carpal tunnel syndrome, unspecified upper limb: Secondary | ICD-10-CM | POA: Insufficient documentation

## 2018-10-08 DIAGNOSIS — Z794 Long term (current) use of insulin: Secondary | ICD-10-CM | POA: Insufficient documentation

## 2018-10-08 DIAGNOSIS — E271 Primary adrenocortical insufficiency: Secondary | ICD-10-CM | POA: Diagnosis not present

## 2018-10-08 DIAGNOSIS — K861 Other chronic pancreatitis: Secondary | ICD-10-CM | POA: Insufficient documentation

## 2018-10-08 DIAGNOSIS — K219 Gastro-esophageal reflux disease without esophagitis: Secondary | ICD-10-CM | POA: Diagnosis not present

## 2018-10-08 DIAGNOSIS — Z9641 Presence of insulin pump (external) (internal): Secondary | ICD-10-CM | POA: Diagnosis not present

## 2018-10-08 DIAGNOSIS — E079 Disorder of thyroid, unspecified: Secondary | ICD-10-CM | POA: Insufficient documentation

## 2018-10-08 DIAGNOSIS — Z1211 Encounter for screening for malignant neoplasm of colon: Secondary | ICD-10-CM | POA: Insufficient documentation

## 2018-10-08 DIAGNOSIS — D66 Hereditary factor VIII deficiency: Secondary | ICD-10-CM | POA: Insufficient documentation

## 2018-10-08 DIAGNOSIS — Z800 Family history of malignant neoplasm of digestive organs: Secondary | ICD-10-CM | POA: Insufficient documentation

## 2018-10-08 DIAGNOSIS — K649 Unspecified hemorrhoids: Secondary | ICD-10-CM | POA: Diagnosis not present

## 2018-10-08 DIAGNOSIS — K589 Irritable bowel syndrome without diarrhea: Secondary | ICD-10-CM | POA: Insufficient documentation

## 2018-10-08 DIAGNOSIS — Z8673 Personal history of transient ischemic attack (TIA), and cerebral infarction without residual deficits: Secondary | ICD-10-CM | POA: Insufficient documentation

## 2018-10-08 DIAGNOSIS — K5730 Diverticulosis of large intestine without perforation or abscess without bleeding: Secondary | ICD-10-CM | POA: Diagnosis not present

## 2018-10-08 DIAGNOSIS — Z882 Allergy status to sulfonamides status: Secondary | ICD-10-CM | POA: Insufficient documentation

## 2018-10-08 DIAGNOSIS — Z90411 Acquired partial absence of pancreas: Secondary | ICD-10-CM | POA: Insufficient documentation

## 2018-10-08 DIAGNOSIS — K635 Polyp of colon: Secondary | ICD-10-CM | POA: Diagnosis not present

## 2018-10-08 DIAGNOSIS — A0472 Enterocolitis due to Clostridium difficile, not specified as recurrent: Secondary | ICD-10-CM | POA: Diagnosis not present

## 2018-10-08 DIAGNOSIS — G43909 Migraine, unspecified, not intractable, without status migrainosus: Secondary | ICD-10-CM | POA: Diagnosis not present

## 2018-10-08 DIAGNOSIS — K7460 Unspecified cirrhosis of liver: Secondary | ICD-10-CM | POA: Diagnosis not present

## 2018-10-08 DIAGNOSIS — R7989 Other specified abnormal findings of blood chemistry: Secondary | ICD-10-CM | POA: Insufficient documentation

## 2018-10-08 DIAGNOSIS — E1122 Type 2 diabetes mellitus with diabetic chronic kidney disease: Secondary | ICD-10-CM | POA: Diagnosis not present

## 2018-10-08 DIAGNOSIS — N189 Chronic kidney disease, unspecified: Secondary | ICD-10-CM | POA: Diagnosis not present

## 2018-10-08 DIAGNOSIS — D122 Benign neoplasm of ascending colon: Secondary | ICD-10-CM | POA: Diagnosis not present

## 2018-10-08 DIAGNOSIS — E119 Type 2 diabetes mellitus without complications: Secondary | ICD-10-CM | POA: Diagnosis not present

## 2018-10-08 DIAGNOSIS — K7581 Nonalcoholic steatohepatitis (NASH): Secondary | ICD-10-CM | POA: Insufficient documentation

## 2018-10-08 HISTORY — PX: COLONOSCOPY WITH PROPOFOL: SHX5780

## 2018-10-08 LAB — CBC WITH DIFFERENTIAL/PLATELET
Abs Immature Granulocytes: 0.01 10*3/uL (ref 0.00–0.07)
Basophils Absolute: 0 10*3/uL (ref 0.0–0.1)
Basophils Relative: 1 %
Eosinophils Absolute: 0 10*3/uL (ref 0.0–0.5)
Eosinophils Relative: 1 %
HCT: 34.7 % — ABNORMAL LOW (ref 36.0–46.0)
Hemoglobin: 12.3 g/dL (ref 12.0–15.0)
Immature Granulocytes: 0 %
Lymphocytes Relative: 26 %
Lymphs Abs: 1.6 10*3/uL (ref 0.7–4.0)
MCH: 29.5 pg (ref 26.0–34.0)
MCHC: 35.4 g/dL (ref 30.0–36.0)
MCV: 83.2 fL (ref 80.0–100.0)
Monocytes Absolute: 0.3 10*3/uL (ref 0.1–1.0)
Monocytes Relative: 5 %
Neutro Abs: 4.1 10*3/uL (ref 1.7–7.7)
Neutrophils Relative %: 67 %
Platelets: 180 10*3/uL (ref 150–400)
RBC: 4.17 MIL/uL (ref 3.87–5.11)
RDW: 12.1 % (ref 11.5–15.5)
WBC: 6.1 10*3/uL (ref 4.0–10.5)
nRBC: 0 % (ref 0.0–0.2)

## 2018-10-08 LAB — PROTIME-INR
INR: 1.1 (ref 0.8–1.2)
Prothrombin Time: 13.7 seconds (ref 11.4–15.2)

## 2018-10-08 LAB — GLUCOSE, CAPILLARY: Glucose-Capillary: 177 mg/dL — ABNORMAL HIGH (ref 70–99)

## 2018-10-08 SURGERY — COLONOSCOPY WITH PROPOFOL
Anesthesia: General

## 2018-10-08 MED ORDER — SODIUM CHLORIDE 0.9 % IV SOLN
INTRAVENOUS | Status: DC
  Administered 2018-10-08: 09:00:00 via INTRAVENOUS

## 2018-10-08 MED ORDER — PROPOFOL 500 MG/50ML IV EMUL
INTRAVENOUS | Status: DC | PRN
  Administered 2018-10-08: 120 ug/kg/min via INTRAVENOUS

## 2018-10-08 MED ORDER — HEPARIN SOD (PORK) LOCK FLUSH 100 UNIT/ML IV SOLN
500.0000 [IU] | INTRAVENOUS | Status: AC | PRN
  Administered 2018-10-08: 100 [IU]

## 2018-10-08 MED ORDER — HEPARIN SOD (PORK) LOCK FLUSH 100 UNIT/ML IV SOLN
INTRAVENOUS | Status: AC
  Administered 2018-10-08: 100 [IU]
  Filled 2018-10-08: qty 5

## 2018-10-08 MED ORDER — LIDOCAINE HCL (CARDIAC) PF 100 MG/5ML IV SOSY
PREFILLED_SYRINGE | INTRAVENOUS | Status: DC | PRN
  Administered 2018-10-08: 50 mg via INTRAVENOUS

## 2018-10-08 MED ORDER — PROPOFOL 500 MG/50ML IV EMUL
INTRAVENOUS | Status: AC
  Filled 2018-10-08: qty 50

## 2018-10-08 MED ORDER — PROPOFOL 10 MG/ML IV BOLUS
INTRAVENOUS | Status: AC
  Filled 2018-10-08: qty 20

## 2018-10-08 MED ORDER — SODIUM CHLORIDE 0.9% FLUSH
10.0000 mL | INTRAVENOUS | Status: AC | PRN
  Administered 2018-10-08: 10 mL

## 2018-10-08 MED ORDER — PROPOFOL 10 MG/ML IV BOLUS
INTRAVENOUS | Status: DC | PRN
  Administered 2018-10-08: 50 mg via INTRAVENOUS
  Administered 2018-10-08 (×2): 20 mg via INTRAVENOUS

## 2018-10-08 NOTE — Progress Notes (Signed)
PAC accessed using sterile tech and blood drawn. Flushed with 20 ml NS and NS started

## 2018-10-08 NOTE — H&P (Signed)
Outpatient short stay form Pre-procedure 10/08/2018 10:17 AM Amber Sails MD  Primary Physician: Dr [name]  Reason for visit: Colonoscopy  History of present illness: Patient is a 53 year old female presenting today for colonoscopy in regards to her family history of colon cancer in a primary relative and colon polyps in multiple primary relatives.  Patient had a colonoscopy several years ago at another institution to get a perforation.  She has had a resection of part of her colon although I am unable to obtain the notes in that regard.  Did have a relatively recent bout about a month ago of C. difficile has a history of abnormal liver testing and partial pancreatectomy.  He had a ventral herniorrhaphy done about 4 months to 5 months ago.  I have discussed this with her surgeon feel it is fine to proceed.  Does have a history of chronic lower abdominal discomfort.  Patient is diabetic.  Most recent labs were today with a INR of 1.1, platelet count of 180 and absolute neutrophils of 4.1.  He tolerated her prep well.    Current Facility-Administered Medications:  .  0.9 %  sodium chloride infusion, , Intravenous, Continuous, Amber Sails, MD, Last Rate: 20 mL/hr at 10/08/18 0846 .  sodium chloride flush (NS) 0.9 % injection 10 mL, 10 mL, Intracatheter, PRN, Gunnar Fusi, MD  Medications Prior to Admission  Medication Sig Dispense Refill Last Dose  . amLODipine (NORVASC) 5 MG tablet Take 5 mg by mouth daily.   10/08/2018 at Unknown time  . lisinopril (ZESTRIL) 40 MG tablet Take 40 mg by mouth daily.   10/08/2018 at Unknown time  . NOVOLOG 100 UNIT/ML injection Inject 0-7 Units into the skin See admin instructions. Sliding scale   10/07/2018 at Unknown time  . ACIDOPHILUS LACTOBACILLUS PO Take 1 capsule by mouth daily.      . Calcium Carb-Cholecalciferol (CALCIUM 600/VITAMIN D3 PO) Take 1 tablet by mouth daily.     . Cholecalciferol (VITAMIN D-3) 5000 units TABS Take 5,000  Units by mouth daily.     . colchicine 0.6 MG tablet 1 tab daily, start 03/02     . cyanocobalamin 2000 MCG tablet Take 2,000 mcg by mouth daily.     Marland Kitchen Dexlansoprazole 30 MG capsule Take 30 mg by mouth daily.     Marland Kitchen dicyclomine (BENTYL) 10 MG capsule Take 10 mg by mouth 3 (three) times daily.     Marland Kitchen gabapentin (NEURONTIN) 300 MG capsule Take 2 capsules (600 mg total) by mouth 3 (three) times daily. 90 capsule 0   . hydrochlorothiazide (HYDRODIURIL) 12.5 MG tablet Take 1 tablet (12.5 mg total) by mouth daily as needed (for fluid). (Patient not taking: Reported on 09/27/2018)     . insulin detemir (LEVEMIR) 100 UNIT/ML injection Inject 15 Units into the skin at bedtime.     . lipase/protease/amylase (CREON) 36000 UNITS CPEP capsule Take 108,000 Units by mouth 3 (three) times daily before meals.     . Loratadine 10 MG CAPS Take 20 mg by mouth daily.     Marland Kitchen lubiprostone (AMITIZA) 24 MCG capsule Take 24 mcg by mouth 2 (two) times daily with a meal.     . Magnesium (V-R MAGNESIUM) 250 MG TABS Take 250 mg by mouth daily.     . potassium chloride (K-DUR,KLOR-CON) 10 MEQ tablet ONE TABLET TWICE A DAY FOR 7 DAYS     . promethazine (PHENERGAN) 12.5 MG tablet Take 12.5 mg by mouth every 6 (  six) hours as needed for nausea or vomiting.     . Simethicone 20 MG/0.3ML SUSP Take 0.3 mLs (20 mg total) by mouth 3 (three) times daily as needed for up to 7 days. 72 mL 0   . vitamin E 400 UNIT capsule Take 400 Units by mouth daily.        Allergies  Allergen Reactions  . Demerol [Meperidine] Hives and Other (See Comments)    Pt states that this medication causes cardiac arrest.    . Dilaudid [Hydromorphone Hcl] Nausea And Vomiting and Other (See Comments)    Pt states that this medication causes cardiac arrest.    . Metoclopramide Hives    Breathing issues  . Morphine And Related Anaphylaxis and Other (See Comments)    Pt states that this medication causes cardiac arrest  . Morpholine Salicylate Nausea And  Vomiting, Other (See Comments) and Rash    CHF  . Isosorbide Nitrate Other (See Comments)    Headache   . Sulfa Antibiotics Nausea And Vomiting  . Adhesive [Tape] Rash    Blisters skin Paper tape is okay  . Darvon [Propoxyphene] Nausea And Vomiting and Rash  . Flexeril [Cyclobenzaprine] Nausea And Vomiting and Rash  . Levaquin [Levofloxacin In D5w] Nausea And Vomiting and Rash  . Naproxen Rash    Ibuprofen and advil are not a problem  . Soma [Carisoprodol] Nausea And Vomiting and Rash  . Zofran [Ondansetron Hcl] Nausea And Vomiting and Rash     Past Medical History:  Diagnosis Date  . Allergy    many many allergies  . Anemia    in past  . Anginal pain ([place]) 01/2018   cardiac workup clear  . Anxiety   . 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[place])    pt unaware of this diagnosis  . Bipolar disorder ([place])   . Bladder mass    removed and had tbt. mesh removed  . Blood transfusion without reported diagnosis 1999   received 20 units of blood after tearing esophagus from vomitting so severely after ERCP  . Breast mass    left side biopsy was negative  . Carpal tunnel syndrome    left hand  . Cervical cancer ([place]) 2002  . Chronic kidney disease    cyst on left kidney  . Chronic pancreatitis ([place])    prior to pancreas surgery  . Cirrhosis ([place])   . Clotting disorder ([place])    pt states that sometimes she has difficulty stopping to bleed and other times it is okay  . Depression   . Diabetes mellitus without complication (HCC)    has insulin pump  . Dyspnea   . Esophagitis   . GERD (gastroesophageal reflux disease)   . Headache    migraines...takes compazine for this  . Heart disease   . 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 Hypertension   . Irritable bowel syndrome   . Kidney mass    just watching. left kidney mass.  . Liver disease    NASH. has had liver biopsies. negative except for NASH  . NASH  (nonalcoholic steatohepatitis)   . NASH (nonalcoholic steatohepatitis)   . Pancreatitis   . Stroke ([place]) 11/2017   had tia. no longer needs plavix  . Thyroid disease     Review of systems:      Physical Exam    Heart and lungs: Regular rate and rhythm without rub  or gallop lungs are bilaterally clear    HEENT: Normocephalic atraumatic eyes are anicteric    Other: Right lower quadrant pain/discomfort to palpation there are no masses or rebound.  He has multiple surgical ports/scarring in the right lower quadrant.  Bowel sounds are positive normoactive    Pertinant exam for procedure: Noted    Planned proceedures: Colonoscopy and indicated procedures. I have discussed the risks benefits and complications of procedures to include not limited to bleeding, infection, perforation and the risk of sedation and the patient wishes to proceed.    Amber Sails, MD Gastroenterology 10/08/2018  10:17 AM

## 2018-10-08 NOTE — Anesthesia Preprocedure Evaluation (Signed)
Anesthesia Evaluation  Patient identified by MRN, date of birth, ID band Patient awake    Reviewed: Allergy & Precautions, NPO status , Patient's Chart, lab work & pertinent test results  History of Anesthesia Complications Negative for: history of anesthetic complications  Airway Mallampati: II       Dental   Pulmonary neg sleep apnea, neg COPD, Not current smoker,           Cardiovascular hypertension, Pt. on medications (-) Past MI and (-) CHF (-) dysrhythmias (-) Valvular Problems/Murmurs     Neuro/Psych neg Seizures Anxiety Depression Bipolar Disorder CVA (L sided weakness, confusion)    GI/Hepatic GERD  ,(+) Hepatitis - (NASH)  Endo/Other  diabetes, Type 1, Insulin Dependent  Renal/GU Renal InsufficiencyRenal disease     Musculoskeletal   Abdominal   Peds  Hematology  (+) anemia ,   Anesthesia Other Findings   Reproductive/Obstetrics                             Anesthesia Physical Anesthesia Plan  ASA: III  Anesthesia Plan: General   Post-op Pain Management:    Induction: Intravenous  PONV Risk Score and Plan: 3 and TIVA, Propofol infusion and Treatment may vary due to age or medical condition  Airway Management Planned: Nasal Cannula  Additional Equipment:   Intra-op Plan:   Post-operative Plan:   Informed Consent: I have reviewed the patients History and Physical, chart, labs and discussed the procedure including the risks, benefits and alternatives for the proposed anesthesia with the patient or authorized representative who has indicated his/her understanding and acceptance.       Plan Discussed with:   Anesthesia Plan Comments:         Anesthesia Quick Evaluation

## 2018-10-08 NOTE — Anesthesia Post-op Follow-up Note (Signed)
Anesthesia QCDR form completed.        

## 2018-10-08 NOTE — Transfer of Care (Signed)
Immediate Anesthesia Transfer of Care Note  Patient: Amber Christensen  Procedure(s) Performed: COLONOSCOPY WITH PROPOFOL (N/A )  Patient Location: PACU  Anesthesia Type:General  Level of Consciousness: awake, alert  and oriented  Airway & Oxygen Therapy: Patient Spontanous Breathing and Patient connected to nasal cannula oxygen  Post-op Assessment: Report given to RN and Post -op Vital signs reviewed and stable  Post vital signs: Reviewed and stable  Last Vitals:  Vitals Value Taken Time  BP 115/71 10/08/18 1116  Temp    Pulse 81 10/08/18 1117  Resp 12 10/08/18 1117  SpO2 99 % 10/08/18 1117  Vitals shown include unvalidated device data.  Last Pain:  Vitals:   10/08/18 0808  TempSrc: Tympanic  PainSc: 6          Complications: No apparent anesthesia complications

## 2018-10-08 NOTE — OR Nursing (Signed)
Anastomosis Reached at 1102

## 2018-10-08 NOTE — Op Note (Signed)
[facility] Gastroenterology Patient Name: Amber Christensen Procedure Date: 10/08/2018 10:25 AM MRN: [id number] Account #: [id number] Date of Birth: [birth year] Admit Type: Outpatient Age: 53 Room: [facility] ENDO ROOM 3 Gender: Female Note Status: Finalized Procedure:            Colonoscopy Indications:          Family history of colon cancer in multiple first-degree                        relatives, Family history of colonic polyps in a                        first-degree relative Providers:            Lollie Sails, MD Referring MD:         [name], MD (Referring MD) Medicines:            Monitored Anesthesia Care Complications:        No immediate complications. Procedure:            Pre-Anesthesia Assessment:                       - ASA Grade Assessment: III - A patient with severe                        systemic disease.                       After obtaining informed consent, the colonoscope was                        passed under direct vision. Throughout the procedure,                        the patient's blood pressure, pulse, and oxygen                        saturations were monitored continuously. The was                        introduced through the anus and advanced to the the                        ileocolonic anastomosis. The colonoscopy was unusually                        difficult due to a tortuous colon. Successful                        completion of the procedure was aided by changing the                        patient to a supine position and changing the patient                        to a prone position. The patient tolerated the                        procedure well. The quality of the bowel preparation  was fair. Findings:      A 12 mm polyp was found in the proximal ascending colon. The polyp was       sessile. The polyp was removed with a cold snare. Resection and       retrieval were complete.      A few  small-mouthed diverticula were found in the sigmoid colon.      Non-bleeding internal hemorrhoids were found during anoscopy. The       hemorrhoids were Grade I (internal hemorrhoids that do not prolapse).      The digital rectal exam was normal otherwise.      There was evidence of a prior functional end-to-end ileo-colonic       anastomosis in the distal ascending colon. This was patent and was       characterized by healthy appearing mucosa. Impression:           - Preparation of the colon was fair.                       - One 12 mm polyp in the proximal ascending colon,                        removed with a cold snare. Resected and retrieved.                       - Diverticulosis in the sigmoid colon.                       - Non-bleeding internal hemorrhoids.                       - Patent functional end-to-end ileo-colonic                        anastomosis, characterized by healthy appearing mucosa. Recommendation:       - Discharge patient to home.                       - Telephone GI clinic for pathology results in 1 week. Procedure Code(s):    --- Professional ---                       [phone], Colonoscopy, flexible; with removal of tumor(s),                        polyp(s), or other lesion(s) by snare technique Diagnosis Code(s):    --- Professional ---                       K64.0, First degree hemorrhoids                       K63.5, Polyp of colon                       Z98.0, Intestinal bypass and anastomosis status                       Z80.0, Family history of malignant neoplasm of                        digestive organs  Z83.71, Family history of colonic polyps                       K57.30, Diverticulosis of large intestine without                        perforation or abscess without bleeding CPT copyright 2019 American Medical Association. All rights reserved. The codes documented in this report are preliminary and upon coder review may  be revised  to meet current compliance requirements. Lollie Sails, MD 10/08/2018 11:17:55 AM This report has been signed electronically. Number of Addenda: 0 Note Initiated On: 10/08/2018 10:25 AM Scope Withdrawal Time: 0 hours 10 minutes 13 seconds  Total Procedure Duration: 0 hours 38 minutes 13 seconds       [facility]

## 2018-10-09 ENCOUNTER — Other Ambulatory Visit: Payer: Self-pay | Admitting: *Deleted

## 2018-10-09 ENCOUNTER — Encounter: Payer: Self-pay | Admitting: Gastroenterology

## 2018-10-09 LAB — SURGICAL PATHOLOGY

## 2018-10-09 NOTE — Anesthesia Postprocedure Evaluation (Signed)
Anesthesia Post Note  Patient: Amber Christensen  Procedure(s) Performed: COLONOSCOPY WITH PROPOFOL (N/A )  Patient location during evaluation: Endoscopy Anesthesia Type: General Level of consciousness: awake and alert Pain management: pain level controlled Vital Signs Assessment: post-procedure vital signs reviewed and stable Respiratory status: spontaneous breathing and respiratory function stable Cardiovascular status: stable Anesthetic complications: no     Last Vitals:  Vitals:   10/08/18 1137 10/08/18 1147  BP: 111/79 132/87  Pulse: 72 67  Resp: 18 (!) 24  Temp:    SpO2: 100% 100%    Last Pain:  Vitals:   10/09/18 0723  TempSrc:   PainSc: 4                  KEPHART,WILLIAM K

## 2018-10-09 NOTE — Patient Outreach (Signed)
[place] [facility]) Care Management [place] PCP completes Transition of Care follow up post-hospital discharge Post-hospital discharge day # 14  10/09/2018  Amber Christensen [facility] 12/07/1965 [id number]  Received incoming call and voice mail from [name], 53 y/o female referred to [place] by inpatient RN CM after recent hospital admission August 9-11, 2020 for hyperglycemia.  Patient experienced high blood sugar at home after her regular insulin therapy was changed by established endocrinologist from insulin pump to injections, due to patient's insurance company no longer covering her insulin pump supplies.  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  In her message, patient requested call back regarding ongoing issues she is having arranging obtaining her necessary supplies for her insulin pump; called patient back within 20 minutes of her call to me.  HIPAA/ identity verified with patient who sounds to be in no distress throughout brief phone call.  Patient stated immediately when I returned her call that she is currently "on hold" with the supplier of her insulin pump supplies; she requested that I call her back tomorrow afternoon; confirmed with patient that she was already on my schedule to call tomorrow, and that I would call her back in the afternoon, as she has requested; I also let her know that [place] team remains involved in her care and has a scheduled call this week as well.  Patient is agreeable and appreciative.  Plan:  Will outreach patient tomorrow afternoon as scheduled  [name], RN, BSN, [place] Coordinator [facility] Care Management  [phone]

## 2018-10-10 ENCOUNTER — Other Ambulatory Visit: Payer: Self-pay | Admitting: *Deleted

## 2018-10-10 ENCOUNTER — Encounter: Payer: Self-pay | Admitting: *Deleted

## 2018-10-10 DIAGNOSIS — Z9889 Other specified postprocedural states: Secondary | ICD-10-CM | POA: Diagnosis not present

## 2018-10-10 DIAGNOSIS — Z86010 Personal history of colonic polyps: Secondary | ICD-10-CM | POA: Diagnosis not present

## 2018-10-10 DIAGNOSIS — R109 Unspecified abdominal pain: Secondary | ICD-10-CM | POA: Diagnosis not present

## 2018-10-10 DIAGNOSIS — R1032 Left lower quadrant pain: Secondary | ICD-10-CM | POA: Diagnosis not present

## 2018-10-10 NOTE — Patient Outreach (Signed)
[place] [facility]) [place] Telephone Outreach PCP office completes Transition of Care follow up post-hospital discharge Post-hospital discharge day # 15 Unsuccessful consecutive outreach attempt # 1- previously engaged patient   10/10/2018  [place] [facility] 12-16-1965 [id number]  1:20 pm:  Unsuccessful telephone outreach to [name], 53 y/o female referred to [place] by inpatient RN CM after recent hospital admission August 9-11, 2020 for hyperglycemia.  Patient experienced high blood sugar at home after her regular insulin therapy was changed by established endocrinologist from insulin pump to injections, due to patient's insurance company no longer covering her insulin pump supplies.  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  HIPAA compliant voice mail message left for patient, requesting return call back.  Plan:  Will re-attempt [place] telephone outreach later this week if I do not hear back from patient first  [name], RN, BSN, [employer] Coordinator [facility] Care Management  [phone]

## 2018-10-11 ENCOUNTER — Ambulatory Visit: Payer: Self-pay | Admitting: Pharmacist

## 2018-10-11 ENCOUNTER — Other Ambulatory Visit: Payer: Self-pay | Admitting: Pharmacist

## 2018-10-11 NOTE — Patient Outreach (Addendum)
[place] [facility]) Care Management  [place]  10/11/2018  [place] 01-07-1966 [id number]  Reason for call: F/u on insulin pump supplies  Outreach:  Unsuccessful telephone call attempt #1 to patient.   HIPAA compliant voicemail left requesting a return call  Plan:  -I will make another outreach attempt to patient within 3-4 business days.    Ralene Bathe, PharmD, [place] [phone]   Addendum: Incoming call from patient.  She is still trying to communicate with [employer] to have insulin supplies covered.  She states she was able to talk to [facility] but was told she cannot switch plans again this year as she already did once in January and now will have to wait until enrollment period in October.  Patient is planning on calling [facility] for assistance with finding a new plan.  She states her CBGs have been better this week because she has had to change her diet due to recent colonoscopy procedure.  She reports CBGs in the 70s the last 2 mornings.  She did eat a small candy bar to increase CBG as she is aware this is lower than goal fasting CBG.  She is appreciative of [facility] efforts to help.    Plan: Will f/u again with patient in 2-3 weeks for update  Ralene Bathe, PharmD, [place] [phone]

## 2018-10-12 ENCOUNTER — Other Ambulatory Visit: Payer: Self-pay | Admitting: *Deleted

## 2018-10-12 ENCOUNTER — Ambulatory Visit: Payer: Self-pay | Admitting: *Deleted

## 2018-10-12 ENCOUNTER — Encounter: Payer: Self-pay | Admitting: *Deleted

## 2018-10-12 NOTE — Patient Outreach (Signed)
[name]) [name] PCP office completes Transition of Care follow up post-hospital discharge Post-hospital discharge day # 17 Unsuccessful consecutive outreach attempt # 2- previously engaged patient  10/12/2018  [name] [facility] [birth year] [id number]  10:40 am:  Unsuccessful telephone outreach to Amber Christensen, 53 y/o female referred to [place] by inpatient RN CM after recent hospital admission August 9-11, 2020 for hyperglycemia.  Patient experienced high blood sugar at home after her regular insulin therapy was changed by established endocrinologist from insulin pump to injections, due to patient's insurance company no longer covering her insulin pump supplies.  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  HIPAA compliant voice mail message left for patient, requesting return call back.  Plan:  Will place [facility] CM unsuccessful patient outreach letter in mail requesting call back in writing, as this is the second unsuccessful outreach attempt in previously engaged patient  Will re-attempt [place] telephone outreach within 4 business days if I do not hear back from patient first.  [name], RN, BSN, [place] Coordinator [facility] Care Management  [phone]

## 2018-10-16 ENCOUNTER — Encounter: Payer: Self-pay | Admitting: *Deleted

## 2018-10-16 ENCOUNTER — Ambulatory Visit: Payer: Self-pay | Admitting: Pharmacist

## 2018-10-16 ENCOUNTER — Other Ambulatory Visit: Payer: Self-pay | Admitting: *Deleted

## 2018-10-16 NOTE — Patient Outreach (Signed)
[facility]) [place] Telephone Outreach PCP office completes Transition of Care follow up post-hospital discharge Post-hospital discharge day # 21  10/16/2018  Andrika Peraza [facility] [birth year] [id number]  Successful telephone outreach to [name], 53 y/o female referred to [place] by inpatient RN CM after recent hospital admission August 9-11, 2020 for hyperglycemia.  Patient experienced high blood sugar at home after her regular insulin therapy was changed by established endocrinologist from insulin pump to injections, due to patient's insurance company no longer covering her insulin pump supplies.  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  HIPAA/ identity verified with patient during phone call today and patient sounds to be in no distress throughout phone call today;  Pleasant 30 minute phone call.  Patient denies new/ recent falls, and she reports chronic abdominal/ pelvic pain at "7/10" that she reports is due to "female problems."  Reports no recent change to pain levels; states pain medication "helps some."  Patient reports ongoing communication with her insurance company, her insulin pump supply company, and medicaid case worker, in an effort to try and obtain her needed supplies to re-initiate use of her insulin pump.  Reports that she "has people working on it," but unfortunately, states that she 'doesn't think any one can do anything."  Reports that she is planning to continue using insulin by injection as currently ordered until she can "finally get away from [employer].  States she is planning to switch insurance providers in October, "or as soon as possible," depending on information she receives in follow up from her medicaid case worker and her insulin pump supply company.  Stated that if she "had known they wouldn't be covering  the insulin pump supplies," that she "would never have chosen" their insurance plan.  Encouraged patient to share her frustration with the insurance company and she stated that she has done so many times, and states hse "beyond frustration" trying to "get through to the [facility] people," and states, "honestly- they really just don't care."    In addition to the stress she describes in handling her insulin pump supplies, patient also verbalizes additional stress in her family around her adult step son/ grand son now living in her home with she and her husband.  Emotional support/ encouragement provided.  Patient further reports:  Medications: -- Has all medicationsand takes as prescribed;denies questions/ concerns/ recent changes around current medications -- continues using injectable insulin as prescribed by endocrinologist now that she is unable to obtain her insulin pump supplies -- encouraged patient to maintain communication with [place] team  Provider appointments: -- All recent and upcoming provider appointments were reviewed with patient today- patient verbalizes accurate understanding of all scheduled appointments and verbalizes plans to attend all- will continue transporting self to all scheduled appointments  Self-health management of DM: -- patient again reports good blood sugar control when she had her insulin pump; states blood sugars remain "all over the place" now that she no longer has the insulin pump.  Reviewed with patient her recent blood sugars recorded at home ---- continues monitoring blood sugars 3-4 times/ day ---- reports fasting blood sugars "a little better" since hospital discharge:  "184" this morning, occasional lows reported since she is injecting insulin: states "it falls to 50-70 at least once a week;" discussed signs/ symptoms hypoglycemia with patient who is very familiar with same; patient  reports she has had DM long enough, she knows when she is running  low.  Verbalizes accurate reporting of corresponding action plan for hypoglycemic episodes ---- post-prandial blood sugars "mainly around 200-300," but states that she still has occasional readings as "high as 400-500" since using injectable insulin ---- aware of current A1-C; again reviewed with patient her personal A1-C trending over last several years; patient worried that her next A1-C will be much higher since she is having to use injectable insulin; discussed that I would place printed educational material in mail to her around A1-C trending ---- endorses that she follows a "healthy diabetic diet" and that she has followed this diet since she became diabetic; declines additional need for diet education at this time ---- patient continually returns to feeling as if she will be able to better control her blood sugars once she is able to obtain her insulin pump supplies, as that system is what she has always used and "knows works" for her  Patient denies further issues, concerns, or problems today. I confirmed that patient hasmy direct phone number, the main [facility] CM office phone number, and the [facility] CM 24-hour nurse advice phone number should issues arise prior to next scheduled [place] outreach, which we scheduled in one month around patient's preference, post- upcomming provider appointments and outpatient endoscopy.  Encouraged patient to contact me directly if needs, questions, issues, or concerns arise prior to next scheduled outreach; patient agreed to do so.  Plan:  Patient will take medications as prescribed and will attend all scheduled provider appointments  Patient will promptly notify care providers for any new concerns/ issues/ problems that arise  Patient will continue monitoring blood sugar readings at home 4 times per day  Patient will maintain engagement with [place] teams around stated need for medication assistance    I will share today's notes and care  plan with patient's PCP as [place] RN CM initial assessment  I will send patient printed educational material on significance/ value of A1-C monitoring and patient will promptly review for further discussion  [place] outreach to continue with scheduled phone call next month, post-upcoming provider appointments, per patient preference  [facility] CM Care Plan Problem One     Most Recent Value  Care Plan Problem One  High Risk for hospital readmission related to/ as evidenced by recent hospitalization August 9-11, 2020 for hyperglycemia  Role Documenting the Problem One  Care Management [place] for Problem One  Active  [facility] Long Term Goal   Over the next 31 days, patient will not experience hospital readmisison, as evidenced by patient reporting and review of EMR during [facility] RN CM outreach  [facility] Long Term Goal Start Date  10/16/18 [Goal extended to next scheduled outreach, per pt. preference]  Interventions for Problem One Long Term Goal  Discussed with patient her current clinical condition and confirmed that she has no current concerns,  discussed recent outpatient colonoscopy and overall plan of care  [facility] CM Short Term Goal #1   Over the next 30 days, patient will enage with [place] team to address her issues with obtaining insulin pump supplies and explore possibility of obtaining a continuous glucose monitoring system, as evidenced by patient reporting and collaboration with [place] team as indicated during [facility] RN CM outreach  [facility] CM Short Term Goal #1 Start Date  10/16/18 [Goal extended to next outreach per pt. preference]  Interventions for Short Term Goal #1  Confirmed that patient continues actively working with [place] team and has contact information,  discussed her efforts in communicating with her insurance company and insulin supply company  [facility] CM Short Term Goal #2   Over the next 30 days, patient will engage with [place] team to address her  concerns with food insecurity, as evidenced by patient reporting and collaboration with [place] team as indicated during [place] outreach  [facility] CM Short Term Goal #2 Start Date  09/27/18  [facility] CM Short Term Goal #2 Met Date  10/16/18 [Goal Met]  Interventions for Short Term Goal #2  Confirmed that patient spoke with [facility] CSW team to address her previously stated concerns aorund food insecurity,  confirmed with patient that this concern has now been resolved    [facility] CM Care Plan Problem Two     Most Recent Value  Care Plan Problem Two  Self-health management of chronic disease state of DM, as evidenced by patient self- reporting  Role Documenting the Problem Two  Care Management Coord[place]  Care Plan for Problem Two  Active  Interventions for Problem Two Long Term Goal   Reviewed with patient recently recorded blood sugars at home and use of insulin at home,  discussed with patient her personal A1-C trends and mailed patient printed educational material around significance/ value of A1-C levels  [facility] Long Term Goal  Over the next 60 days, patient will continue monitoring blood sugars at home 4 x per day as evidenced by review of same with patient during [facility] RN CM outreach  [facility] Long Term Goal Start Date  09/27/18     [name], RN, BSN, [employer] Coordinator [facility] Care Management  [phone]

## 2018-10-25 ENCOUNTER — Ambulatory Visit: Payer: Self-pay | Admitting: *Deleted

## 2018-10-29 DIAGNOSIS — E109 Type 1 diabetes mellitus without complications: Secondary | ICD-10-CM | POA: Diagnosis not present

## 2018-10-29 DIAGNOSIS — E139 Other specified diabetes mellitus without complications: Secondary | ICD-10-CM | POA: Diagnosis not present

## 2018-10-30 ENCOUNTER — Ambulatory Visit: Payer: Self-pay | Admitting: Pharmacist

## 2018-10-30 ENCOUNTER — Other Ambulatory Visit: Payer: Self-pay | Admitting: Pharmacist

## 2018-10-30 DIAGNOSIS — G8929 Other chronic pain: Secondary | ICD-10-CM | POA: Diagnosis not present

## 2018-10-30 DIAGNOSIS — R109 Unspecified abdominal pain: Secondary | ICD-10-CM | POA: Diagnosis not present

## 2018-10-30 DIAGNOSIS — E1365 Other specified diabetes mellitus with hyperglycemia: Secondary | ICD-10-CM | POA: Diagnosis not present

## 2018-10-30 DIAGNOSIS — K8689 Other specified diseases of pancreas: Secondary | ICD-10-CM | POA: Diagnosis not present

## 2018-10-30 DIAGNOSIS — Z86010 Personal history of colonic polyps: Secondary | ICD-10-CM | POA: Diagnosis not present

## 2018-10-30 NOTE — Patient Outreach (Signed)
[facility]) Care Management  [place]  10/30/2018  [place] 1965-08-16 [id number]   Reason for call: f/u on insulin pump supplies  Communication received from [facility] pharmacist to [place] that plan is working to fix error with coverage of diabetic pump supplies for patient  Outreach:  Unsuccessful telephone call attempt #1 to patient.   HIPAA compliant voicemail left requesting a return call  Plan:  -I will make another outreach attempt to patient in 1-2 weeks.    Ralene Bathe, PharmD, [place] [phone]

## 2018-11-01 DIAGNOSIS — N9089 Other specified noninflammatory disorders of vulva and perineum: Secondary | ICD-10-CM | POA: Diagnosis not present

## 2018-11-01 DIAGNOSIS — L259 Unspecified contact dermatitis, unspecified cause: Secondary | ICD-10-CM | POA: Diagnosis not present

## 2018-11-01 DIAGNOSIS — N898 Other specified noninflammatory disorders of vagina: Secondary | ICD-10-CM | POA: Diagnosis not present

## 2018-11-06 ENCOUNTER — Other Ambulatory Visit: Payer: Self-pay | Admitting: Pharmacist

## 2018-11-06 ENCOUNTER — Ambulatory Visit: Payer: Self-pay | Admitting: Pharmacist

## 2018-11-06 DIAGNOSIS — Z882 Allergy status to sulfonamides status: Secondary | ICD-10-CM | POA: Diagnosis not present

## 2018-11-06 DIAGNOSIS — K7581 Nonalcoholic steatohepatitis (NASH): Secondary | ICD-10-CM | POA: Diagnosis not present

## 2018-11-06 DIAGNOSIS — E119 Type 2 diabetes mellitus without complications: Secondary | ICD-10-CM | POA: Diagnosis not present

## 2018-11-06 DIAGNOSIS — K760 Fatty (change of) liver, not elsewhere classified: Secondary | ICD-10-CM | POA: Diagnosis not present

## 2018-11-06 DIAGNOSIS — K861 Other chronic pancreatitis: Secondary | ICD-10-CM | POA: Diagnosis not present

## 2018-11-06 DIAGNOSIS — K5790 Diverticulosis of intestine, part unspecified, without perforation or abscess without bleeding: Secondary | ICD-10-CM | POA: Diagnosis not present

## 2018-11-06 DIAGNOSIS — Z885 Allergy status to narcotic agent status: Secondary | ICD-10-CM | POA: Diagnosis not present

## 2018-11-06 DIAGNOSIS — M810 Age-related osteoporosis without current pathological fracture: Secondary | ICD-10-CM | POA: Diagnosis not present

## 2018-11-06 DIAGNOSIS — Z7982 Long term (current) use of aspirin: Secondary | ICD-10-CM | POA: Diagnosis not present

## 2018-11-06 DIAGNOSIS — K589 Irritable bowel syndrome without diarrhea: Secondary | ICD-10-CM | POA: Diagnosis not present

## 2018-11-06 NOTE — Patient Outreach (Addendum)
[facility]) Care Management  [place] 11/06/2018  [place] [birth year] [id number]  Reason for call: f/u on insulin pump supplies  Outreach:  Unsuccessful telephone call attempt #2 to patient. HIPAA compliant voicemail left requesting a return call  Plan:  -I will make another outreach attempt to patient within 3-4 business days.    Ralene Bathe, PharmD, [place] [phone]   Addendum: Incoming call from patient.  Patient reports she finally was able to obtain insulin pump supplies and started back on the pump last week.  She is very appreciative of [facility] assistance.    Plan: Will close [facility] pharmacy case. Am happy to assist in the future as needed.   Ralene Bathe, PharmD, [place] [phone]

## 2018-11-07 DIAGNOSIS — E139 Other specified diabetes mellitus without complications: Secondary | ICD-10-CM | POA: Diagnosis not present

## 2018-11-12 ENCOUNTER — Ambulatory Visit: Payer: Self-pay | Admitting: Pharmacist

## 2018-11-12 DIAGNOSIS — Z9641 Presence of insulin pump (external) (internal): Secondary | ICD-10-CM | POA: Diagnosis not present

## 2018-11-12 DIAGNOSIS — E1065 Type 1 diabetes mellitus with hyperglycemia: Secondary | ICD-10-CM | POA: Diagnosis not present

## 2018-11-12 DIAGNOSIS — E139 Other specified diabetes mellitus without complications: Secondary | ICD-10-CM | POA: Diagnosis not present

## 2018-11-15 ENCOUNTER — Other Ambulatory Visit: Payer: Self-pay | Admitting: *Deleted

## 2018-11-15 ENCOUNTER — Encounter: Payer: Self-pay | Admitting: *Deleted

## 2018-11-15 DIAGNOSIS — I10 Essential (primary) hypertension: Secondary | ICD-10-CM | POA: Diagnosis not present

## 2018-11-15 DIAGNOSIS — E109 Type 1 diabetes mellitus without complications: Secondary | ICD-10-CM | POA: Diagnosis not present

## 2018-11-15 DIAGNOSIS — Z01818 Encounter for other preprocedural examination: Secondary | ICD-10-CM | POA: Diagnosis not present

## 2018-11-15 DIAGNOSIS — K029 Dental caries, unspecified: Secondary | ICD-10-CM | POA: Diagnosis not present

## 2018-11-15 DIAGNOSIS — K0889 Other specified disorders of teeth and supporting structures: Secondary | ICD-10-CM | POA: Diagnosis not present

## 2018-11-15 DIAGNOSIS — K219 Gastro-esophageal reflux disease without esophagitis: Secondary | ICD-10-CM | POA: Diagnosis not present

## 2018-11-15 DIAGNOSIS — K861 Other chronic pancreatitis: Secondary | ICD-10-CM | POA: Diagnosis not present

## 2018-11-15 NOTE — Patient Outreach (Signed)
[facility]) Care Management [place] Telephone Outreach Post-hospital discharge day # 51 without unplanned hospital readmission  11/15/2018  Amber Christensen [facility] 16-Sep-1965 [id number]  Successful telephone outreach to Amber Christensen, 53 y/o female referred to [place] by inpatient RN CM after recent hospital admission August 9-11, 2020 for hyperglycemia. 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  HIPAA/ identity verified with patient during phone call today and patient sounds to be in no distress; today, she denies pain and new/ recent falls.  Continues to complain of occasional/ intermittent chronic abdominal/ pelvic pain, but states "none" today.  Patient further reports:  -- has now obtained insulin pump supplies and is using-- patient is very happy about this and reports that with much effort, she was able to "get a supervisor with her insurance plan, the medical supply company, and a medicaid person" to re-instate coverage of her insulin pump.  Patient reports "everything is settling down"  Now that she is again using insulin pump, rather than injectable insulin.  Denies ongoing concerns/ issues/ questions around medications and reports taking all as prescribed without recent changes; continues self-managing medications  -- visited with endocrinology provider this week: reports A1-C "still high" due to extreme fluctuations in blood sugars while she was off of insulin pump; states "A1-C of 9.5" and reports endocrinology provider "wasn't surprised" due to fluctuations of recent blood sugars while off insulin pump. Verbalizes plans to closely  monitor blood sugars to begin decreasing blood sugars.    -- reports "stable and better" blood sugar ranges at home now that she is back on insulin pump: states fasting ranges between 170-230 with value this morning of "174."  Reports post-prandial ranges 200-300; states blood sugars rarely go above 300. Continues using glucometer to monitor blood sugars 3-4 times per day; would like to obtain CGM system and verbalizes plans to check with insurance around coverage benefits of same- patient was encouraged to do so.   Does not consistently record blood sugars, was encouraged to do, however patient reports she has monitored her blood sugar for so long, "she can remember from day to day."  Denies recent episodes of hypoglycemia at home now that insulin pump has been resumed  -- continues following diabetic heart healthy diet-- states she was contacted by [employer] provider about a "nutrition program" that will help her to obtain additional food stamps and work with her on diet choices.  Patient is excited about this and states that if this turns out to be a good program, she "may not leave [employer]" after all, as she previously planned to do, due to frustrations around getting coverage for her insulin pump.  Patient was encouraged to participate in the insurance nutrition program.  I also explained to patient that I would be transferring her care over to [facility] RN CM dedicated to [facility], and encouraged her to maintain engagement with RN CM once outreach is established and patient agrees to do so.  -- "stress" around previously reported family dynamics with her adult step-son who lives with her "has calmed down" and "is better;" patient denies excessive stress today and she denies ongoing community resource needs [facility] CSW team previously involved).  SDOH updated today  for: financial resource strain/ food insecurity/ and transportation-- patient denies concerns with all.  -- reviewed upcoming provider  appointments with patient-- states she is going to PCP this afternoon to obtain "clearance to have root canal;" states she is not sure she will obtain a flu shot this year, as she "had a bad reaction" last year; verbalized plans to discuss with PCP and she was encouraged to do so.  Adds that she is scheduled for "next endoscopy for abdominal pain evaluation "at [facility] on January 04, 2019.  Continues to transport self to all provider appointments.  Patient denies further issues, concerns, or problems today. Idiscussed with patient that new [facility] RN CM would contact her next within the next 4 weeks, and she is agreeable to this; confirmed that patient hasmy direct phone number, the main [facility] CM office phone number, and the [facility] CM 24-hour nurse advice phone number should issues arise prior to next [place] outreach by dedicated RN CM for [facility].  Plan:  Patient will take medications as prescribed and will attend all scheduled provider appointments  Patient will promptly notify care providers for any new concerns/ issues/ problems that arise  Patient will continue monitoringblood sugar readings at home 4 times per day  I will update [facility] RN CM dedicated to [facility] to ensure hand-off of care  [facility] RN CM outreach to continue with phone callwithin the next 4 weeks  [facility] CM Care Plan Problem One     Most Recent Value  Care Plan Problem One  High Risk for hospital readmission related to/ as evidenced by recent hospitalization August 9-11, 2020 for hyperglycemia  Role Documenting the Problem One  Care Management [place] for Problem One  Not Active  [facility] Long Term Goal   Over the next 31 days, patient will not experience hospital readmisison, as evidenced by patient reporting and review of EMR during [facility] RN CM outreach  [facility] Long Term Goal Start Date  10/16/18  [facility] Long Term Goal Met Date  11/15/18 [facility] met]  Interventions for Problem One Long Term Goal  Confirmed that patient has not  experienced unplanned hospital readmission since her last discharge and discussed current clinical condition with her,  confirmed that patient does not have current clinical concerns  [facility] CM Short Term Goal #1   Over the next 30 days, patient will enage with [place] team to address her issues with obtaining insulin pump supplies and explore possibility of obtaining a continuous glucose monitoring system, as evidenced by patient reporting and collaboration with [place] team as indicated during [facility] RN CM outreach  [facility] CM Short Term Goal #1 Start Date  10/16/18  [facility] CM Short Term Goal #1 Met Date  11/15/18 [Goal Met]  Interventions for Short Term Goal #1  Confirmed that patient's issue with obtaining insulin pump supplies has been resolved with insurance provider/ medical supply company and medicaid,  confirmed that patient has no further concerns around medications    [facility] CM Care Plan Problem Two     Most Recent Value  Care Plan Problem Two  Self-health management of chronic disease state of DM, as evidenced by patient self- reporting  Role Documenting the Problem Two  Care Management [place] for Problem Two  Active  Interventions for Problem Two Long Term Goal   Reviewed with patient recent blood sugars and confirmed that previously reported low blood sugars at home have resolved with reinstatement of use of insulin pump,  confirmed that patient attended endocrinology provider appointment this week,  reviewed overall plan of care and recent A1-C with patient,  confirmed that patient continues using glucometer to monitor blood sugars at home 3-4 times per day   [facility] Long Term Goal  Over the next 31 days, patient will continue monitoring blood sugars at home 4 x per day as evidenced by review of same with patient during [place] outreach [Goal modified and extended today]  [facility] Long Term Goal Start Date  11/15/18     It has been a pleasure caring for Hughes Better, RN, BSN, [employer] Coordinator [facility] Care Management  [phone]

## 2018-11-28 DIAGNOSIS — I208 Other forms of angina pectoris: Secondary | ICD-10-CM | POA: Diagnosis not present

## 2018-11-28 DIAGNOSIS — K766 Portal hypertension: Secondary | ICD-10-CM | POA: Diagnosis not present

## 2018-11-28 DIAGNOSIS — I10 Essential (primary) hypertension: Secondary | ICD-10-CM | POA: Diagnosis not present

## 2018-11-28 DIAGNOSIS — I776 Arteritis, unspecified: Secondary | ICD-10-CM | POA: Diagnosis not present

## 2018-11-28 DIAGNOSIS — R0789 Other chest pain: Secondary | ICD-10-CM | POA: Diagnosis not present

## 2018-11-28 DIAGNOSIS — K7460 Unspecified cirrhosis of liver: Secondary | ICD-10-CM | POA: Diagnosis not present

## 2018-11-28 DIAGNOSIS — Z8679 Personal history of other diseases of the circulatory system: Secondary | ICD-10-CM | POA: Diagnosis not present

## 2018-11-28 DIAGNOSIS — R0602 Shortness of breath: Secondary | ICD-10-CM | POA: Diagnosis not present

## 2018-11-28 DIAGNOSIS — Z8673 Personal history of transient ischemic attack (TIA), and cerebral infarction without residual deficits: Secondary | ICD-10-CM | POA: Diagnosis not present

## 2018-11-29 DIAGNOSIS — R197 Diarrhea, unspecified: Secondary | ICD-10-CM | POA: Diagnosis not present

## 2018-11-29 DIAGNOSIS — R112 Nausea with vomiting, unspecified: Secondary | ICD-10-CM | POA: Diagnosis not present

## 2018-12-05 DIAGNOSIS — R197 Diarrhea, unspecified: Secondary | ICD-10-CM | POA: Diagnosis not present

## 2018-12-06 ENCOUNTER — Other Ambulatory Visit: Payer: Self-pay

## 2018-12-06 NOTE — Patient Outreach (Signed)
[facility]) Care Management  [place]  12/06/2018   Zoe Creasman [facility] 04-28-1965 [id number]  Subjective: Telephone call to patient for follow up.  Patient reports so far so good.  Patient states she had a dental procedure on yesterday and had to hold her medication and resulted in a high blood sugar yesterday of 468. Patient states that otherwise her sugars are in the upper 100's right. Patient continues to work to get her sugars down. Patient to see Dr. Gabriel Carina again next month and also has GI procedure next month per patient. Discussed blood sugar management and proper follow up with physicians.  She verbalized understanding.    Objective:   Encounter Medications:  Outpatient Encounter Medications as of 12/06/2018  Medication Sig Note  . ACIDOPHILUS LACTOBACILLUS PO Take 1 capsule by mouth daily.    Marland Kitchen amLODipine (NORVASC) 5 MG tablet Take 5 mg by mouth daily.   . Calcium Carb-Cholecalciferol (CALCIUM 600/VITAMIN D3 PO) Take 1 tablet by mouth daily.   . Cholecalciferol (VITAMIN D-3) 5000 units TABS Take 5,000 Units by mouth daily.   . colchicine 0.6 MG tablet 1 tab daily, start 03/02   . cyanocobalamin 2000 MCG tablet Take 2,000 mcg by mouth daily.   Marland Kitchen Dexlansoprazole 30 MG capsule Take 30 mg by mouth daily.   Marland Kitchen dicyclomine (BENTYL) 10 MG capsule Take 10 mg by mouth 3 (three) times daily. 09/24/2018: New prescription, not started yet  . gabapentin (NEURONTIN) 300 MG capsule Take 2 capsules (600 mg total) by mouth 3 (three) times daily. 09/27/2018: Takes as needed per recommendation of endocrinologist, Dr. Gabriel Carina   . insulin detemir (LEVEMIR) 100 UNIT/ML injection Inject 15 Units into the skin at bedtime.   . lipase/protease/amylase (CREON) 36000 UNITS CPEP capsule Take 108,000 Units by mouth 3 (three) times daily before meals.   Marland Kitchen lisinopril (ZESTRIL) 40 MG tablet Take 40 mg by mouth daily.   . Loratadine 10 MG CAPS Take 20 mg by mouth daily.   Marland Kitchen lubiprostone  (AMITIZA) 24 MCG capsule Take 24 mcg by mouth 2 (two) times daily with a meal. 09/24/2018: New prescription, not started yet  . Magnesium (V-R MAGNESIUM) 250 MG TABS Take 250 mg by mouth daily.   Marland Kitchen NOVOLOG 100 UNIT/ML injection Inject 0-7 Units into the skin See admin instructions. Sliding scale 09/27/2018: Reports during medication review taking sliding scale as follows: 130-180: 1 U 181-230: 2 U 231-280: 3 U 281-330: 4 U 331-380: 5 U 381-420: 6 U > 420:     7 U   . potassium chloride (K-DUR,KLOR-CON) 10 MEQ tablet ONE TABLET TWICE A DAY FOR 7 DAYS 09/27/2018: Reports taking as needed  . promethazine (PHENERGAN) 12.5 MG tablet Take 12.5 mg by mouth every 6 (six) hours as needed for nausea or vomiting. 09/27/2018: Takes as needed  . vitamin E 400 UNIT capsule Take 400 Units by mouth daily.   . hydrochlorothiazide (HYDRODIURIL) 12.5 MG tablet Take 1 tablet (12.5 mg total) by mouth daily as needed (for fluid). (Patient not taking: Reported on 12/06/2018)   . Simethicone 20 MG/0.3ML SUSP Take 0.3 mLs (20 mg total) by mouth 3 (three) times daily as needed for up to 7 days.    No facility-administered encounter medications on file as of 12/06/2018.     Functional Status:  In your present state of health, do you have any difficulty performing the following activities: 12/06/2018 10/16/2018  Hearing? N N  Vision? N Y  Comment - reports wears  readers for close vision issues; denies issues with distance vision  Difficulty concentrating or making decisions? N -  Walking or climbing stairs? N -  Dressing or bathing? N -  Doing errands, shopping? N -  Preparing Food and eating ? N -  Using the Toilet? N -  In the past six months, have you accidently leaked urine? N -  Comment - -  Do you have problems with loss of bowel control? Y -  Comment depends sometimes -  Managing your Medications? - -  Managing your Finances? Y -  Comment spouse does -  [place], broadcasting/film/video? Y -  Comment  spouse helps -  Some recent data might be hidden    Fall/Depression Screening: Fall Risk  12/06/2018 10/16/2018 09/27/2018  Falls in the past year? 0 (No Data) 1  Comment - falls screening previously completed- patient denies new/ recent falls -  Number falls in past yr: - - 1  Injury with Fall? - - 0  Risk for fall due to : - - History of fall(s);Other (Comment)  Risk for fall due to: Comment - - Occasional episodes dizziness  Follow up - Falls prevention discussed Education provided;Falls prevention discussed   PHQ 2/9 Scores 12/06/2018 10/16/2018  PHQ - 2 Score 0 0    Assessment: Patient continues to manage chronic disease processes.  Patient following up as appropriate with providers.   Plan:  [facility] CM Care Plan Problem Two     Most Recent Value  Care Plan Problem Two  Self-health management of chronic disease state of DM, as evidenced by patient self- reporting  Role Documenting the Problem Two  Care Management Telephonic [place] for Problem Two  Active  Interventions for Problem Two Long Term Goal   RN CM reviewed blood sugars with patient.  Patient has several appointments upcoming and GI procedure next month. Patient to see Endcrinologist next month.  Patient continues to use insulin pump.  Discussed blood sugar management and importance.  [facility] Long Term Goal  Over the next 31 days, patient will continue monitoring blood sugars at home 4 x per day as evidenced by review of same with patient during [place] outreach [Goal modified and extended today]  [facility] Long Term Goal Start Date  11/15/18     RN CM will move patient to disease management program at this time. RN CM will send update to physician. RN CM will contact patient next month and patient agreeable.    Jone Baseman, RN, MSN [place] [facility] Care Management Coordinator Direct Line [phone] Cell [phone] Toll Free: [phone]  Fax: [phone]

## 2018-12-07 ENCOUNTER — Emergency Department
Admission: EM | Admit: 2018-12-07 | Discharge: 2018-12-08 | Disposition: A | Discharge status: Home / Self Care | Payer: Medicare HMO | Attending: Emergency Medicine | Admitting: Emergency Medicine

## 2018-12-07 DIAGNOSIS — Z794 Long term (current) use of insulin: Secondary | ICD-10-CM | POA: Diagnosis not present

## 2018-12-07 DIAGNOSIS — Z8541 Personal history of malignant neoplasm of cervix uteri: Secondary | ICD-10-CM | POA: Diagnosis not present

## 2018-12-07 DIAGNOSIS — R1032 Left lower quadrant pain: Secondary | ICD-10-CM | POA: Diagnosis not present

## 2018-12-07 DIAGNOSIS — Z79899 Other long term (current) drug therapy: Secondary | ICD-10-CM | POA: Diagnosis not present

## 2018-12-07 DIAGNOSIS — N1330 Unspecified hydronephrosis: Secondary | ICD-10-CM | POA: Insufficient documentation

## 2018-12-07 DIAGNOSIS — Z9641 Presence of insulin pump (external) (internal): Secondary | ICD-10-CM | POA: Insufficient documentation

## 2018-12-07 DIAGNOSIS — E1165 Type 2 diabetes mellitus with hyperglycemia: Secondary | ICD-10-CM | POA: Diagnosis not present

## 2018-12-07 DIAGNOSIS — N189 Chronic kidney disease, unspecified: Secondary | ICD-10-CM | POA: Insufficient documentation

## 2018-12-07 DIAGNOSIS — R000 Tachycardia, unspecified: Secondary | ICD-10-CM | POA: Diagnosis not present

## 2018-12-07 DIAGNOSIS — R110 Nausea: Secondary | ICD-10-CM | POA: Diagnosis not present

## 2018-12-07 DIAGNOSIS — R0689 Other abnormalities of breathing: Secondary | ICD-10-CM | POA: Diagnosis not present

## 2018-12-07 DIAGNOSIS — R112 Nausea with vomiting, unspecified: Secondary | ICD-10-CM | POA: Diagnosis not present

## 2018-12-07 DIAGNOSIS — R109 Unspecified abdominal pain: Secondary | ICD-10-CM | POA: Diagnosis not present

## 2018-12-07 DIAGNOSIS — E119 Type 2 diabetes mellitus without complications: Secondary | ICD-10-CM | POA: Diagnosis not present

## 2018-12-07 DIAGNOSIS — I129 Hypertensive chronic kidney disease with stage 1 through stage 4 chronic kidney disease, or unspecified chronic kidney disease: Secondary | ICD-10-CM | POA: Insufficient documentation

## 2018-12-08 ENCOUNTER — Other Ambulatory Visit: Payer: Self-pay

## 2018-12-08 ENCOUNTER — Emergency Department: Payer: Medicare HMO

## 2018-12-08 DIAGNOSIS — R109 Unspecified abdominal pain: Secondary | ICD-10-CM | POA: Diagnosis not present

## 2018-12-08 DIAGNOSIS — R1032 Left lower quadrant pain: Secondary | ICD-10-CM | POA: Diagnosis not present

## 2018-12-08 LAB — URINALYSIS, COMPLETE (UACMP) WITH MICROSCOPIC
Bacteria, UA: NONE SEEN
Bilirubin Urine: NEGATIVE
Glucose, UA: NEGATIVE mg/dL
Hgb urine dipstick: NEGATIVE
Ketones, ur: NEGATIVE mg/dL
Leukocytes,Ua: NEGATIVE
Nitrite: NEGATIVE
Protein, ur: NEGATIVE mg/dL
Specific Gravity, Urine: 1.015 (ref 1.005–1.030)
pH: 5 (ref 5.0–8.0)

## 2018-12-08 LAB — COMPREHENSIVE METABOLIC PANEL
ALT: 34 U/L (ref 0–44)
AST: 23 U/L (ref 15–41)
Albumin: 4.2 g/dL (ref 3.5–5.0)
Alkaline Phosphatase: 135 U/L — ABNORMAL HIGH (ref 38–126)
Anion gap: 12 (ref 5–15)
BUN: 18 mg/dL (ref 6–20)
CO2: 24 mmol/L (ref 22–32)
Calcium: 8.6 mg/dL — ABNORMAL LOW (ref 8.9–10.3)
Chloride: 95 mmol/L — ABNORMAL LOW (ref 98–111)
Creatinine, Ser: 0.91 mg/dL (ref 0.44–1.00)
GFR calc Af Amer: >60 mL/min (ref >60)
GFR calc non Af Amer: >60 mL/min (ref >60)
Glucose, Bld: 479 mg/dL — ABNORMAL HIGH (ref 70–99)
Potassium: 3.8 mmol/L (ref 3.5–5.1)
Sodium: 131 mmol/L — ABNORMAL LOW (ref 135–145)
Total Bilirubin: 5.5 mg/dL — ABNORMAL HIGH (ref 0.3–1.2)
Total Protein: 7.2 g/dL (ref 6.5–8.1)

## 2018-12-08 LAB — CBC
HCT: 37 % (ref 36.0–46.0)
Hemoglobin: 13.3 g/dL (ref 12.0–15.0)
MCH: 29.2 pg (ref 26.0–34.0)
MCHC: 35.9 g/dL (ref 30.0–36.0)
MCV: 81.1 fL (ref 80.0–100.0)
Platelets: 163 10*3/uL (ref 150–400)
RBC: 4.56 MIL/uL (ref 3.87–5.11)
RDW: 12.2 % (ref 11.5–15.5)
WBC: 13.1 10*3/uL — ABNORMAL HIGH (ref 4.0–10.5)
nRBC: 0 % (ref 0.0–0.2)

## 2018-12-08 LAB — BLOOD GAS, VENOUS
Acid-Base Excess: 0.5 mmol/L (ref 0.0–2.0)
Bicarbonate: 25.4 mmol/L (ref 20.0–28.0)
O2 Saturation: 74.9 %
Patient temperature: 37
pCO2, Ven: 41 mmHg — ABNORMAL LOW (ref 44.0–60.0)
pH, Ven: 7.4 (ref 7.250–7.430)
pO2, Ven: 40 mmHg (ref 32.0–45.0)

## 2018-12-08 LAB — GLUCOSE, CAPILLARY
Glucose-Capillary: 171 mg/dL — ABNORMAL HIGH (ref 70–99)
Glucose-Capillary: 459 mg/dL — ABNORMAL HIGH (ref 70–99)

## 2018-12-08 MED ORDER — SODIUM CHLORIDE 0.9 % IV BOLUS
1000.0000 mL | Freq: Once | INTRAVENOUS | Status: AC
  Administered 2018-12-08: 1000 mL via INTRAVENOUS

## 2018-12-08 MED ORDER — IOHEXOL 300 MG/ML  SOLN
100.0000 mL | Freq: Once | INTRAMUSCULAR | Status: AC | PRN
  Administered 2018-12-08: 100 mL via INTRAVENOUS

## 2018-12-08 MED ORDER — PROMETHAZINE HCL 25 MG/ML IJ SOLN
25.0000 mg | Freq: Once | INTRAMUSCULAR | Status: AC
  Administered 2018-12-08: 25 mg via INTRAVENOUS
  Filled 2018-12-08: qty 1

## 2018-12-08 MED ORDER — INSULIN ASPART 100 UNIT/ML ~~LOC~~ SOLN
10.0000 [IU] | Freq: Once | SUBCUTANEOUS | Status: AC
  Administered 2018-12-08: 10 [IU] via INTRAVENOUS
  Filled 2018-12-08: qty 1

## 2018-12-08 MED ORDER — IOHEXOL 9 MG/ML PO SOLN
500.0000 mL | ORAL | Status: AC
  Administered 2018-12-08: 500 mL via ORAL

## 2018-12-08 NOTE — ED Provider Notes (Signed)
[facility] Emergency Department Provider Note  Time seen: 12:12 AM  I have reviewed the triage vital signs and the nursing notes.   HISTORY  Chief Complaint Abdominal Pain   HPI Amber Christensen is a 53 y.o. female with a past medical history of anemia, anxiety, bipolar, chronic pancreatitis, cirrhosis, diabetes with insulin pump, hypertension, Amber Christensen, presents to the emergency department for abdominal pain.  According to the patient she states for years now she has been experiencing intermittent abdominal pain.  States this episode started approximately 1 week ago she called her doctor who started her on Bentyl, states pain is mostly in the left lower quadrant.  States she called him back and told her to start Imodium tomorrow because she was having increased frequency of formed bowel movements.  Patient states the pain seemed to worsen tonight in the left lower quadrant and she was becoming nauseated with several episodes of vomiting tonight.  Patient also states her blood sugars have been running high mostly in the 4-5 100s over the last couple days and she was concerned that she could be going into DKA as well.  Also states a history of diverticulitis as well as chronic pancreatitis in the past which they both feel somewhat similar per patient.  Patient states she did have a slight fever at home 100.1 per patient.  Currently 98.3 in the emergency department.  Denies any cough.  Past Medical History:  Diagnosis Date  . Allergy    many many allergies  . Anemia    in past  . Anginal pain ([place]) 01/2018   cardiac workup clear  . Anxiety   . 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[place])    pt unaware of this diagnosis  . Bipolar disorder ([place])   . Bladder mass    removed and had tbt. mesh removed  . Blood transfusion without reported diagnosis 1999   received 20 units of blood after tearing esophagus from vomitting so severely after ERCP  . Breast mass    left side biopsy was  negative  . Carpal tunnel syndrome    left hand  . Cervical cancer ([place]) 2002  . Chronic kidney disease    cyst on left kidney  . Chronic pancreatitis ([place])    prior to pancreas surgery  . Cirrhosis ([place])   . Clotting disorder ([place])    pt states that sometimes she has difficulty stopping to bleed and other times it is okay  . Depression   . Diabetes mellitus without complication (HCC)    has insulin pump  . Dyspnea   . Esophagitis   . GERD (gastroesophageal reflux disease)   . Headache    migraines...takes compazine for this  . Heart disease   . 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 Hypertension   . Irritable bowel syndrome   . Kidney mass    just watching. left kidney mass.  . Liver disease    NASH. has had liver biopsies. negative except for NASH  . NASH (nonalcoholic steatohepatitis)   . NASH (nonalcoholic steatohepatitis)   . Pancreatitis   . Stroke ([place]) 11/2017   had tia. no longer needs plavix  . Thyroid disease     Patient Active Problem List   Diagnosis Date Noted  . Hyperglycemia 09/23/2018  . S/P ventral herniorrhaphy 03/15/2018  . Ventral hernia without obstruction or gangrene   . Hypertension 03/21/2017  .  Diabetes mellitus without complication ([place]) 81/19/1478  . Pancreatitis 03/21/2017  . Poor venous access 03/21/2017  . Vasculitis ([place]) 08/03/2016  . Acute maculopapular rash 04/21/2016  . Maculopapular rash, generalized 04/20/2016  . Unstable angina ([place]) 09/28/2015    Past Surgical History:  Procedure Laterality Date  . ABDOMINAL HYSTERECTOMY    . APPENDECTOMY    . BLADDER SURGERY     mesh removed. revision which has caused everything to fall again  . BOWEL RESECTION     took a piece of bowel out after pancreatectomy caused problem with bowel.   Marland Kitchen BREAST BIOPSY Left 1989   benign  . CARDIAC CATHETERIZATION  2956,2130, 1998   no stents, fatty streaks  . CHOLECYSTECTOMY     . COLON SURGERY    . COLONOSCOPY WITH PROPOFOL N/A 10/08/2018   Procedure: COLONOSCOPY WITH PROPOFOL;  Surgeon: Lollie Sails, MD;  Location: [facility] ENDOSCOPY;  Service: Endoscopy;  Laterality: N/A;  . CYST REMOVAL HAND Left 1992   Ganglion cyst removed from Left Wrist  . CYSTECTOMY    . DILATATION & CURETTAGE/HYSTEROSCOPY WITH MYOSURE    . DILATION AND CURETTAGE OF UTERUS    . ERCP    . ESOPHAGOGASTRODUODENOSCOPY    . HERNIA REPAIR    . KNEE ARTHROSCOPY Left 1992  . LIVER BIOPSY    . PANCREAS SURGERY  2006   partial pancreatectomy after endoscope knicked a part of pancreas.   Marland Kitchen PORTA CATH INSERTION N/A 04/10/2017   Procedure: PORTA CATH INSERTION;  Surgeon: Algernon Huxley, MD;  Location: [place] CV LAB;  Service: Cardiovascular;  Laterality: N/A;  . REPAIR OF ESOPHAGUS  2012   3 clamps placed in esophagus  . ROBOTIC ASSISTED LAPAROSCOPIC VENTRAL/INCISIONAL HERNIA REPAIR N/A 03/15/2018   Procedure: ROBOTIC ASSISTED LAPAROSCOPIC VENTRAL HERNIA REPAIR;  Surgeon: Jules Husbands, MD;  Location: [facility] ORS;  Service: General;  Laterality: N/A;  . SMALL BOWEL REPAIR    . TONSILLECTOMY      Prior to Admission medications   Medication Sig Start Date End Date Taking? Authorizing Provider  ACIDOPHILUS LACTOBACILLUS PO Take 1 capsule by mouth daily.     [provider]  amLODipine (NORVASC) 5 MG tablet Take 5 mg by mouth daily.    [provider]  Calcium Carb-Cholecalciferol (CALCIUM 600/VITAMIN D3 PO) Take 1 tablet by mouth daily.    [provider]  Cholecalciferol (VITAMIN D-3) 5000 units TABS Take 5,000 Units by mouth daily.    [provider]  colchicine 0.6 MG tablet 1 tab daily, start 03/02 04/16/18   [provider]  cyanocobalamin 2000 MCG tablet Take 2,000 mcg by mouth daily.    [provider]  Dexlansoprazole 30 MG capsule Take 30 mg by mouth daily.    [provider]  dicyclomine (BENTYL) 10 MG capsule Take 10 mg by  mouth 3 (three) times daily. 08/20/18 08/20/19  [provider]  gabapentin (NEURONTIN) 300 MG capsule Take 2 capsules (600 mg total) by mouth 3 (three) times daily. 03/16/18   Tylene Fantasia, PA-C  hydrochlorothiazide (HYDRODIURIL) 12.5 MG tablet Take 1 tablet (12.5 mg total) by mouth daily as needed (for fluid). Patient not taking: Reported on 12/06/2018 09/25/18   Henreitta Leber, MD  insulin detemir (LEVEMIR) 100 UNIT/ML injection Inject 15 Units into the skin at bedtime.    [provider]  lipase/protease/amylase (CREON) 36000 UNITS CPEP capsule Take 108,000 Units by mouth 3 (three) times daily before meals.  [provider]  lisinopril (ZESTRIL) 40 MG tablet Take 40 mg by mouth daily. 09/03/18   [provider]  Loratadine 10 MG CAPS Take 20 mg by mouth daily. 08/07/18 08/07/19  [provider]  lubiprostone (AMITIZA) 24 MCG capsule Take 24 mcg by mouth 2 (two) times daily with a meal.    [provider]  Magnesium (V-R MAGNESIUM) 250 MG TABS Take 250 mg by mouth daily.    [provider]  NOVOLOG 100 UNIT/ML injection Inject 0-7 Units into the skin See admin instructions. Sliding scale    [provider]  potassium chloride (K-DUR,KLOR-CON) 10 MEQ tablet ONE TABLET TWICE A DAY FOR 7 DAYS 04/10/18   [provider]  promethazine (PHENERGAN) 12.5 MG tablet Take 12.5 mg by mouth every 6 (six) hours as needed for nausea or vomiting.    [provider]  Simethicone 20 MG/0.3ML SUSP Take 0.3 mLs (20 mg total) by mouth 3 (three) times daily as needed for up to 7 days. 07/25/18 08/01/18  Laban Emperor, PA-C  vitamin E 400 UNIT capsule Take 400 Units by mouth daily.    [provider]    Allergies  Allergen Reactions  . Demerol [Meperidine] Hives and Other (See Comments)    Pt states that this medication causes cardiac arrest.    . Dilaudid [Hydromorphone Hcl] Nausea And Vomiting and Other (See Comments)     Pt states that this medication causes cardiac arrest.    . Metoclopramide Hives    Breathing issues  . Morphine And Related Anaphylaxis and Other (See Comments)    Pt states that this medication causes cardiac arrest  . Morpholine Salicylate Nausea And Vomiting, Other (See Comments) and Rash    CHF  . Isosorbide Nitrate Other (See Comments)    Headache   . Sulfa Antibiotics Nausea And Vomiting  . Adhesive [Tape] Rash    Blisters skin Paper tape is okay  . Darvon [Propoxyphene] Nausea And Vomiting and Rash  . Flexeril [Cyclobenzaprine] Nausea And Vomiting and Rash  . Levaquin [Levofloxacin In D5w] Nausea And Vomiting and Rash  . Naproxen Rash    Ibuprofen and advil are not a problem  . Soma [Carisoprodol] Nausea And Vomiting and Rash  . Zofran [Ondansetron Hcl] Nausea And Vomiting and Rash    Family History  Problem Relation Age of Onset  . Cirrhosis Mother   . Colon cancer Mother 11       TAH/BSO in ~75; deceased at 32  . Hypertension Mother   . Breast cancer Mother 82       unconfirmed  . Hypertension Father   . Prostate cancer Father 82       currently 65  . Other Father        liver disease  . Breast cancer Maternal Aunt        age at dx unk.; currently 74  . Cervical cancer Maternal Aunt   . Cancer Maternal Uncle        unk. type; currently 83s  . Stomach cancer Paternal Aunt 64       deceased at 76  . Breast cancer Maternal Grandmother 41       possibly bilateral in 43s; deceased 34  . Non-Hodgkin's lymphoma Daughter        unconfirmed; currently 76  . Cancer Maternal Aunt        hematologic malignancy; deceased 10    Social History Social History   Tobacco Use  .  Smoking status: Never Smoker  . Smokeless tobacco: Never Used  Substance Use Topics  . Alcohol use: Yes    Comment: rarely  . Drug use: No    Review of Systems Constitutional: Fever to 100.1 at home. Cardiovascular: Negative for chest pain. Respiratory: Negative for shortness of  breath.  Negative for cough. Gastrointestinal: Left lower quadrant abdominal pain, mild to moderate dull aching pain.  Positive for nausea vomiting tonight.  Negative for diarrhea. Genitourinary: Negative for urinary compaints Musculoskeletal: Negative for musculoskeletal complaints Skin: Negative for skin complaints  Neurological: Intermittent headaches. All other ROS negative  ____________________________________________   PHYSICAL EXAM:  VITAL SIGNS: ED Triage Vitals  Enc Vitals Group     BP 12/08/18 0003 130/89     Pulse Rate 12/08/18 0003 (!) 114     Resp 12/08/18 0003 18     Temp 12/08/18 0003 98.3 F (36.8 C)     Temp Source 12/08/18 0003 Oral     SpO2 12/08/18 0003 96 %     Weight 12/08/18 0005 160 lb (72.6 kg)     Height 12/08/18 0005 5' 3"  (1.6 m)     Head Circumference --      Peak Flow --      Pain Score 12/08/18 0005 8     Pain Loc --      Pain Edu? --      Excl. in [place]? --    Constitutional: Alert and oriented. Well appearing and in no distress. Eyes: Normal exam ENT      Head: Normocephalic and atraumatic      Mouth/Throat: Mucous membranes are moist. Cardiovascular: Normal rate, regular rhythm. No murmur Respiratory: Normal respiratory effort without tachypnea nor retractions. Breath sounds are clear Gastrointestinal: Mild fairly diffuse abdominal tenderness to palpation somewhat more so in left lower quadrant.  No rebound guarding or distention. Musculoskeletal: Nontender with normal range of motion in all extremities.  Neurologic:  Normal speech and language. No gross focal neurologic deficits  Skin:  Skin is warm, dry and intact.  Psychiatric: Mood and affect are normal.  ____________________________________________   RADIOLOGY  CT scan does not appear to show any acute abnormality.  ____________________________________________   INITIAL IMPRESSION / ASSESSMENT AND PLAN / ED COURSE  Pertinent labs & imaging results that were available during  my care of the patient were reviewed by me and considered in my medical decision making (see chart for details).   Patient presents to the emergency department for abdominal pain nausea vomiting.  Differential is quite broad but would include intra-abdominal pathology such as pancreatitis or diverticulitis, UTI or pyelonephritis, SBO.  Patient is status post cholecystectomy as well as appendectomy.  We will check labs, IV hydrate, obtain CT imaging.  Patient agreeable.  CT scan is largely nonrevealing.  Lab work did show an elevated blood glucose of 479.  Received IV insulin and fluids blood glucose is now 170.  Remainder the lab work is largely at baseline.  VBG pH of 7.4.  Bilirubin is elevated to 5.5 although it has been elevated in the past as well looking at prior records.  ALEJANDRA HUNT was evaluated in Emergency Department on 12/08/2018 for the symptoms described in the history of present illness. She was evaluated in the context of the global COVID-19 pandemic, which necessitated consideration that the patient might be at risk for infection with the SARS-CoV-2 virus that causes COVID-19. Institutional protocols and algorithms that pertain to the evaluation of patients at risk for  COVID-19 are in a state of rapid change based on information released by regulatory bodies including the CDC and federal and state organizations. These policies and algorithms were followed during the patient's care in the ED.  ____________________________________________   FINAL CLINICAL IMPRESSION(S) / ED DIAGNOSES  Abdominal pain   Harvest Dark, MD 12/08/18 [phone]

## 2018-12-08 NOTE — ED Notes (Signed)
Went to check on patient's progress on oral contrast, patient had spilled at least half of bottle. CT made aware. This Probation officer stood in room and got patient to drink 2nd bottle with out spilling. CT made aware that patient has drank 2nd bottle.

## 2018-12-08 NOTE — ED Notes (Signed)
Patient transfer to CT by MRI tech

## 2018-12-08 NOTE — ED Notes (Signed)
Ambre Kobayashi called to pick up patient due to patient being discharge. Explained patient needing some clothes.

## 2018-12-08 NOTE — ED Triage Notes (Signed)
Patient arrived to ER from home by EMS. Patient has complaints of abd pain. Patient states she was at [facility] a few days ago for same. Per EMS during stay at [facility] pt has high liver enzymes. Patient has hx pancreatitis, diabetes, and has insulin pump. EMS states CBG was 525

## 2018-12-11 DIAGNOSIS — R109 Unspecified abdominal pain: Secondary | ICD-10-CM | POA: Diagnosis not present

## 2018-12-11 DIAGNOSIS — Z23 Encounter for immunization: Secondary | ICD-10-CM | POA: Diagnosis not present

## 2018-12-11 DIAGNOSIS — K861 Other chronic pancreatitis: Secondary | ICD-10-CM | POA: Diagnosis not present

## 2018-12-11 DIAGNOSIS — N39498 Other specified urinary incontinence: Secondary | ICD-10-CM | POA: Diagnosis not present

## 2018-12-11 DIAGNOSIS — K219 Gastro-esophageal reflux disease without esophagitis: Secondary | ICD-10-CM | POA: Diagnosis not present

## 2018-12-11 DIAGNOSIS — R112 Nausea with vomiting, unspecified: Secondary | ICD-10-CM | POA: Diagnosis not present

## 2018-12-11 DIAGNOSIS — R159 Full incontinence of feces: Secondary | ICD-10-CM | POA: Diagnosis not present

## 2018-12-11 DIAGNOSIS — I10 Essential (primary) hypertension: Secondary | ICD-10-CM | POA: Diagnosis not present

## 2018-12-11 DIAGNOSIS — F411 Generalized anxiety disorder: Secondary | ICD-10-CM | POA: Diagnosis not present

## 2018-12-11 DIAGNOSIS — K582 Mixed irritable bowel syndrome: Secondary | ICD-10-CM | POA: Diagnosis not present

## 2018-12-11 DIAGNOSIS — E139 Other specified diabetes mellitus without complications: Secondary | ICD-10-CM | POA: Diagnosis not present

## 2018-12-11 DIAGNOSIS — E1365 Other specified diabetes mellitus with hyperglycemia: Secondary | ICD-10-CM | POA: Diagnosis not present

## 2018-12-17 DIAGNOSIS — Z8673 Personal history of transient ischemic attack (TIA), and cerebral infarction without residual deficits: Secondary | ICD-10-CM | POA: Diagnosis not present

## 2018-12-17 DIAGNOSIS — R0602 Shortness of breath: Secondary | ICD-10-CM | POA: Diagnosis not present

## 2018-12-17 DIAGNOSIS — G459 Transient cerebral ischemic attack, unspecified: Secondary | ICD-10-CM | POA: Diagnosis not present

## 2018-12-17 DIAGNOSIS — R0789 Other chest pain: Secondary | ICD-10-CM | POA: Diagnosis not present

## 2018-12-17 DIAGNOSIS — Z8679 Personal history of other diseases of the circulatory system: Secondary | ICD-10-CM | POA: Diagnosis not present

## 2018-12-18 ENCOUNTER — Other Ambulatory Visit: Payer: Self-pay | Admitting: Internal Medicine

## 2018-12-18 DIAGNOSIS — R0602 Shortness of breath: Secondary | ICD-10-CM

## 2018-12-18 DIAGNOSIS — Z8673 Personal history of transient ischemic attack (TIA), and cerebral infarction without residual deficits: Secondary | ICD-10-CM

## 2018-12-18 DIAGNOSIS — Z8679 Personal history of other diseases of the circulatory system: Secondary | ICD-10-CM

## 2018-12-18 DIAGNOSIS — R0789 Other chest pain: Secondary | ICD-10-CM

## 2018-12-25 ENCOUNTER — Ambulatory Visit
Admission: RE | Admit: 2018-12-25 | Discharge: 2018-12-25 | Disposition: A | Discharge status: Home / Self Care | Payer: Medicare HMO | Source: Ambulatory Visit | Attending: Internal Medicine | Admitting: Internal Medicine

## 2018-12-25 ENCOUNTER — Other Ambulatory Visit: Payer: Self-pay

## 2018-12-25 DIAGNOSIS — Z8679 Personal history of other diseases of the circulatory system: Secondary | ICD-10-CM | POA: Diagnosis not present

## 2018-12-25 DIAGNOSIS — R0602 Shortness of breath: Secondary | ICD-10-CM | POA: Insufficient documentation

## 2018-12-25 DIAGNOSIS — R0789 Other chest pain: Secondary | ICD-10-CM | POA: Insufficient documentation

## 2018-12-25 DIAGNOSIS — I25110 Atherosclerotic heart disease of native coronary artery with unstable angina pectoris: Secondary | ICD-10-CM | POA: Diagnosis not present

## 2018-12-25 DIAGNOSIS — Z8673 Personal history of transient ischemic attack (TIA), and cerebral infarction without residual deficits: Secondary | ICD-10-CM | POA: Diagnosis not present

## 2018-12-25 MED ORDER — TECHNETIUM TC 99M TETROFOSMIN IV KIT
30.0000 | PACK | Freq: Once | INTRAVENOUS | Status: AC | PRN
  Administered 2018-12-25: 30.776 via INTRAVENOUS

## 2018-12-25 MED ORDER — TECHNETIUM TC 99M TETROFOSMIN IV KIT
10.0000 | PACK | Freq: Once | INTRAVENOUS | Status: AC | PRN
  Administered 2018-12-25: 10.437 via INTRAVENOUS

## 2018-12-25 MED ORDER — REGADENOSON 0.4 MG/5ML IV SOLN
0.4000 mg | Freq: Once | INTRAVENOUS | Status: AC
  Administered 2018-12-25: 0.4 mg via INTRAVENOUS

## 2018-12-31 DIAGNOSIS — N898 Other specified noninflammatory disorders of vagina: Secondary | ICD-10-CM | POA: Diagnosis not present

## 2018-12-31 DIAGNOSIS — Z124 Encounter for screening for malignant neoplasm of cervix: Secondary | ICD-10-CM | POA: Diagnosis not present

## 2018-12-31 DIAGNOSIS — R87610 Atypical squamous cells of undetermined significance on cytologic smear of cervix (ASC-US): Secondary | ICD-10-CM | POA: Diagnosis not present

## 2018-12-31 DIAGNOSIS — Z8719 Personal history of other diseases of the digestive system: Secondary | ICD-10-CM | POA: Diagnosis not present

## 2018-12-31 DIAGNOSIS — R0602 Shortness of breath: Secondary | ICD-10-CM | POA: Diagnosis not present

## 2018-12-31 DIAGNOSIS — I776 Arteritis, unspecified: Secondary | ICD-10-CM | POA: Diagnosis not present

## 2018-12-31 DIAGNOSIS — K2100 Gastro-esophageal reflux disease with esophagitis, without bleeding: Secondary | ICD-10-CM | POA: Diagnosis not present

## 2018-12-31 DIAGNOSIS — K766 Portal hypertension: Secondary | ICD-10-CM | POA: Diagnosis not present

## 2018-12-31 DIAGNOSIS — K7460 Unspecified cirrhosis of liver: Secondary | ICD-10-CM | POA: Diagnosis not present

## 2018-12-31 DIAGNOSIS — I208 Other forms of angina pectoris: Secondary | ICD-10-CM | POA: Diagnosis not present

## 2018-12-31 DIAGNOSIS — I10 Essential (primary) hypertension: Secondary | ICD-10-CM | POA: Diagnosis not present

## 2018-12-31 DIAGNOSIS — K7581 Nonalcoholic steatohepatitis (NASH): Secondary | ICD-10-CM | POA: Diagnosis not present

## 2019-01-01 DIAGNOSIS — Z20828 Contact with and (suspected) exposure to other viral communicable diseases: Secondary | ICD-10-CM | POA: Diagnosis not present

## 2019-01-01 DIAGNOSIS — R109 Unspecified abdominal pain: Secondary | ICD-10-CM | POA: Diagnosis not present

## 2019-01-01 DIAGNOSIS — K8681 Exocrine pancreatic insufficiency: Secondary | ICD-10-CM | POA: Diagnosis not present

## 2019-01-01 DIAGNOSIS — Z01812 Encounter for preprocedural laboratory examination: Secondary | ICD-10-CM | POA: Diagnosis not present

## 2019-01-01 DIAGNOSIS — R197 Diarrhea, unspecified: Secondary | ICD-10-CM | POA: Diagnosis not present

## 2019-01-02 DIAGNOSIS — R197 Diarrhea, unspecified: Secondary | ICD-10-CM | POA: Diagnosis not present

## 2019-01-03 LAB — NM MYOCAR MULTI W/SPECT W/WALL MOTION / EF
Estimated workload: 1 METS
Exercise duration (min): 1 min
Exercise duration (sec): 3 s
LV dias vol: 43 mL (ref 46–106)
LV sys vol: 19 mL
MPHR: 167 {beats}/min
Peak HR: 121 {beats}/min
Percent HR: 72 %
Rest HR: 78 {beats}/min
SDS: 0
SRS: 2
SSS: 0
TID: 1.53

## 2019-01-04 ENCOUNTER — Other Ambulatory Visit: Payer: Self-pay

## 2019-01-04 DIAGNOSIS — R1084 Generalized abdominal pain: Secondary | ICD-10-CM | POA: Diagnosis not present

## 2019-01-04 DIAGNOSIS — K317 Polyp of stomach and duodenum: Secondary | ICD-10-CM | POA: Diagnosis not present

## 2019-01-04 DIAGNOSIS — Z794 Long term (current) use of insulin: Secondary | ICD-10-CM | POA: Diagnosis not present

## 2019-01-04 DIAGNOSIS — E119 Type 2 diabetes mellitus without complications: Secondary | ICD-10-CM | POA: Diagnosis not present

## 2019-01-04 DIAGNOSIS — K589 Irritable bowel syndrome without diarrhea: Secondary | ICD-10-CM | POA: Diagnosis not present

## 2019-01-04 DIAGNOSIS — K861 Other chronic pancreatitis: Secondary | ICD-10-CM | POA: Diagnosis not present

## 2019-01-04 DIAGNOSIS — Z79899 Other long term (current) drug therapy: Secondary | ICD-10-CM | POA: Diagnosis not present

## 2019-01-04 DIAGNOSIS — K2930 Chronic superficial gastritis without bleeding: Secondary | ICD-10-CM | POA: Diagnosis not present

## 2019-01-04 DIAGNOSIS — K7460 Unspecified cirrhosis of liver: Secondary | ICD-10-CM | POA: Diagnosis not present

## 2019-01-04 DIAGNOSIS — I10 Essential (primary) hypertension: Secondary | ICD-10-CM | POA: Diagnosis not present

## 2019-01-04 NOTE — Patient Outreach (Signed)
[facility]) Care Management  [place]  01/04/2019   Kailynn Satterly [facility] 08-Sep-1965 [id number]  Subjective: Telephone call to patient for monthly check in. Patient states she just returned from ERCP.  She states nothing was found but she will be going to GI at [facility] for further investigation.  Patient continues to have bouts of diarrhea and other GI problems.  Patient reports that her blood sugars are much better.  She states no 300-400 range blood sugars.  She reports that this mornings blood sugar was 156. Discussed with patient importance of diet compliance and monitoring sugars.  She verbalized understanding and denies any problems.    Objective:   Encounter Medications:  Outpatient Encounter Medications as of 01/04/2019  Medication Sig Note  . ACIDOPHILUS LACTOBACILLUS PO Take 1 capsule by mouth daily.    Marland Kitchen amLODipine (NORVASC) 5 MG tablet Take 5 mg by mouth daily.   . Calcium Carb-Cholecalciferol (CALCIUM 600/VITAMIN D3 PO) Take 1 tablet by mouth daily.   . Cholecalciferol (VITAMIN D-3) 5000 units TABS Take 5,000 Units by mouth daily.   . colchicine 0.6 MG tablet 1 tab daily, start 03/02   . cyanocobalamin 2000 MCG tablet Take 2,000 mcg by mouth daily.   Marland Kitchen Dexlansoprazole 30 MG capsule Take 30 mg by mouth daily.   Marland Kitchen dicyclomine (BENTYL) 10 MG capsule Take 10 mg by mouth 3 (three) times daily. 09/24/2018: New prescription, not started yet  . gabapentin (NEURONTIN) 300 MG capsule Take 2 capsules (600 mg total) by mouth 3 (three) times daily. 09/27/2018: Takes as needed per recommendation of endocrinologist, Dr. Gabriel Carina   . hydrochlorothiazide (HYDRODIURIL) 12.5 MG tablet Take 1 tablet (12.5 mg total) by mouth daily as needed (for fluid). (Patient not taking: Reported on 12/06/2018)   . insulin detemir (LEVEMIR) 100 UNIT/ML injection Inject 15 Units into the skin at bedtime.   . lipase/protease/amylase (CREON) 36000 UNITS CPEP capsule Take 108,000 Units by mouth  3 (three) times daily before meals.   Marland Kitchen lisinopril (ZESTRIL) 40 MG tablet Take 40 mg by mouth daily.   . Loratadine 10 MG CAPS Take 20 mg by mouth daily.   Marland Kitchen lubiprostone (AMITIZA) 24 MCG capsule Take 24 mcg by mouth 2 (two) times daily with a meal. 09/24/2018: New prescription, not started yet  . Magnesium (V-R MAGNESIUM) 250 MG TABS Take 250 mg by mouth daily.   Marland Kitchen NOVOLOG 100 UNIT/ML injection Inject 0-7 Units into the skin See admin instructions. Sliding scale 09/27/2018: Reports during medication review taking sliding scale as follows: 130-180: 1 U 181-230: 2 U 231-280: 3 U 281-330: 4 U 331-380: 5 U 381-420: 6 U > 420:     7 U   . potassium chloride (K-DUR,KLOR-CON) 10 MEQ tablet ONE TABLET TWICE A DAY FOR 7 DAYS 09/27/2018: Reports taking as needed  . promethazine (PHENERGAN) 12.5 MG tablet Take 12.5 mg by mouth every 6 (six) hours as needed for nausea or vomiting. 09/27/2018: Takes as needed  . Simethicone 20 MG/0.3ML SUSP Take 0.3 mLs (20 mg total) by mouth 3 (three) times daily as needed for up to 7 days.   . vitamin E 400 UNIT capsule Take 400 Units by mouth daily.    No facility-administered encounter medications on file as of 01/04/2019.     Functional Status:  In your present state of health, do you have any difficulty performing the following activities: 12/06/2018 10/16/2018  Hearing? N N  Vision? N Y  Comment - reports wears  readers for close vision issues; denies issues with distance vision  Difficulty concentrating or making decisions? N -  Walking or climbing stairs? N -  Dressing or bathing? N -  Doing errands, shopping? N -  Preparing Food and eating ? N -  Using the Toilet? N -  In the past six months, have you accidently leaked urine? N -  Comment - -  Do you have problems with loss of bowel control? Y -  Comment depends sometimes -  Managing your Medications? - -  Managing your Finances? Y -  Comment spouse does -  Runner, broadcasting/film/video? Y -   Comment spouse helps -  Some recent data might be hidden    Fall/Depression Screening: Fall Risk  12/06/2018 10/16/2018 09/27/2018  Falls in the past year? 0 (No Data) 1  Comment - falls screening previously completed- patient denies new/ recent falls -  Number falls in past yr: - - 1  Injury with Fall? - - 0  Risk for fall due to : - - History of fall(s);Other (Comment)  Risk for fall due to: Comment - - Occasional episodes dizziness  Follow up - Falls prevention discussed Education provided;Falls prevention discussed   PHQ 2/9 Scores 12/06/2018 10/16/2018  PHQ - 2 Score 0 0    Assessment:  Patient continues to manage chronic illnesses.  No recent hospitalization.  Plan:  [facility] CM Care Plan Problem Two     Most Recent Value  Care Plan Problem Two  Self-health management of chronic disease state of DM, as evidenced by patient self- reporting  Role Documenting the Problem Two  Care Management Telephonic [place] for Problem Two  Active  Interventions for Problem Two Long Term Goal   RN CM discussed blood sugars with patient.  Patient checking sugars regularly.  Discussed importance of blood sugar maintenance and diet.    [facility] Long Term Goal  Over the next 31 days, patient will continue monitoring blood sugars at home 4 x per day as evidenced by review of same with patient during [place] outreach [Goal modified and extended today]  [facility] Long Term Goal Start Date  01/04/19     RN CM will contact patient next month and patient agreeable.   Jone Baseman, RN, MSN [place] Management Care Management Coordinator Direct Line [phone] Cell [phone] Toll Free: [phone]  Fax: [phone]

## 2019-01-07 ENCOUNTER — Ambulatory Visit: Payer: Self-pay

## 2019-01-14 DIAGNOSIS — R69 Illness, unspecified: Secondary | ICD-10-CM | POA: Diagnosis not present

## 2019-01-19 DIAGNOSIS — E109 Type 1 diabetes mellitus without complications: Secondary | ICD-10-CM | POA: Diagnosis not present

## 2019-01-19 DIAGNOSIS — E139 Other specified diabetes mellitus without complications: Secondary | ICD-10-CM | POA: Diagnosis not present

## 2019-01-22 DIAGNOSIS — N3010 Interstitial cystitis (chronic) without hematuria: Secondary | ICD-10-CM | POA: Diagnosis not present

## 2019-01-22 DIAGNOSIS — R3989 Other symptoms and signs involving the genitourinary system: Secondary | ICD-10-CM | POA: Diagnosis not present

## 2019-01-25 DIAGNOSIS — I10 Essential (primary) hypertension: Secondary | ICD-10-CM | POA: Diagnosis not present

## 2019-01-25 DIAGNOSIS — K2100 Gastro-esophageal reflux disease with esophagitis, without bleeding: Secondary | ICD-10-CM | POA: Diagnosis not present

## 2019-01-25 DIAGNOSIS — K861 Other chronic pancreatitis: Secondary | ICD-10-CM | POA: Diagnosis not present

## 2019-01-25 DIAGNOSIS — E1140 Type 2 diabetes mellitus with diabetic neuropathy, unspecified: Secondary | ICD-10-CM | POA: Diagnosis not present

## 2019-01-25 DIAGNOSIS — Z09 Encounter for follow-up examination after completed treatment for conditions other than malignant neoplasm: Secondary | ICD-10-CM | POA: Diagnosis not present

## 2019-01-25 DIAGNOSIS — E139 Other specified diabetes mellitus without complications: Secondary | ICD-10-CM | POA: Diagnosis not present

## 2019-01-28 ENCOUNTER — Other Ambulatory Visit: Payer: Self-pay

## 2019-01-28 NOTE — Patient Outreach (Signed)
[facility]) Care Management  01/28/2019  Amber Christensen [facility] [birth year] [id number]   Telephone call to patient for follow up. She reports she is hanging in there.  She states that she has an ultrasound scheduled for tomorrow and that her PCP thinks something is going on with her bladder.  She has a referral to a urologist in January.  Patient admits to more incontinence but is managing.  Patient states she does not want to go to the hospital if she can help it. Patient reports that her sugars are doing good and offers no concerns with her diabetes.    Discussed [employer] SNP with patient. She admits she is followed by the [place].  Explained to patient that [facility] CM will no longer follow as she is a part of the SNP program.  She verbalized understanding and declined any needs at this time.   Plan: RN CM will close case.    Jone Baseman, RN, MSN [place] Management Care Management Coordinator Direct Line [phone] Cell [phone] Toll Free: [phone]  Fax: [phone]

## 2019-01-29 DIAGNOSIS — R109 Unspecified abdominal pain: Secondary | ICD-10-CM | POA: Diagnosis not present

## 2019-02-01 DIAGNOSIS — K7581 Nonalcoholic steatohepatitis (NASH): Secondary | ICD-10-CM | POA: Diagnosis not present

## 2019-02-01 DIAGNOSIS — Z0000 Encounter for general adult medical examination without abnormal findings: Secondary | ICD-10-CM | POA: Diagnosis not present

## 2019-02-01 DIAGNOSIS — R5383 Other fatigue: Secondary | ICD-10-CM | POA: Diagnosis not present

## 2019-02-01 DIAGNOSIS — Z79899 Other long term (current) drug therapy: Secondary | ICD-10-CM | POA: Diagnosis not present

## 2019-02-01 DIAGNOSIS — R109 Unspecified abdominal pain: Secondary | ICD-10-CM | POA: Diagnosis not present

## 2019-02-01 DIAGNOSIS — E139 Other specified diabetes mellitus without complications: Secondary | ICD-10-CM | POA: Diagnosis not present

## 2019-02-01 DIAGNOSIS — R5381 Other malaise: Secondary | ICD-10-CM | POA: Diagnosis not present

## 2019-02-01 DIAGNOSIS — K5790 Diverticulosis of intestine, part unspecified, without perforation or abscess without bleeding: Secondary | ICD-10-CM | POA: Diagnosis not present

## 2019-02-01 DIAGNOSIS — I10 Essential (primary) hypertension: Secondary | ICD-10-CM | POA: Diagnosis not present

## 2019-02-04 ENCOUNTER — Other Ambulatory Visit: Payer: Self-pay

## 2019-02-04 ENCOUNTER — Other Ambulatory Visit
Admission: RE | Admit: 2019-02-04 | Discharge: 2019-02-04 | Disposition: A | Discharge status: Home / Self Care | Payer: Medicare HMO | Source: Ambulatory Visit | Attending: Internal Medicine | Admitting: Internal Medicine

## 2019-02-04 DIAGNOSIS — Z20828 Contact with and (suspected) exposure to other viral communicable diseases: Secondary | ICD-10-CM | POA: Insufficient documentation

## 2019-02-04 LAB — SARS CORONAVIRUS 2 (TAT 6-24 HRS): SARS Coronavirus 2: NEGATIVE

## 2019-02-06 ENCOUNTER — Encounter
Admission: RE | Disposition: A | Discharge status: Home / Self Care | Payer: Self-pay | Source: Home / Self Care | Attending: Internal Medicine

## 2019-02-06 ENCOUNTER — Ambulatory Visit
Admission: RE | Admit: 2019-02-06 | Discharge: 2019-02-06 | Disposition: A | Discharge status: Home / Self Care | Payer: Medicare HMO | Attending: Internal Medicine | Admitting: Internal Medicine

## 2019-02-06 ENCOUNTER — Other Ambulatory Visit: Payer: Self-pay

## 2019-02-06 ENCOUNTER — Encounter: Payer: Self-pay | Admitting: Internal Medicine

## 2019-02-06 DIAGNOSIS — E109 Type 1 diabetes mellitus without complications: Secondary | ICD-10-CM | POA: Diagnosis not present

## 2019-02-06 DIAGNOSIS — Z794 Long term (current) use of insulin: Secondary | ICD-10-CM | POA: Diagnosis not present

## 2019-02-06 DIAGNOSIS — K7581 Nonalcoholic steatohepatitis (NASH): Secondary | ICD-10-CM | POA: Insufficient documentation

## 2019-02-06 DIAGNOSIS — K219 Gastro-esophageal reflux disease without esophagitis: Secondary | ICD-10-CM | POA: Insufficient documentation

## 2019-02-06 DIAGNOSIS — R079 Chest pain, unspecified: Secondary | ICD-10-CM | POA: Diagnosis not present

## 2019-02-06 DIAGNOSIS — I10 Essential (primary) hypertension: Secondary | ICD-10-CM | POA: Diagnosis not present

## 2019-02-06 DIAGNOSIS — Z79899 Other long term (current) drug therapy: Secondary | ICD-10-CM | POA: Insufficient documentation

## 2019-02-06 DIAGNOSIS — F329 Major depressive disorder, single episode, unspecified: Secondary | ICD-10-CM | POA: Insufficient documentation

## 2019-02-06 DIAGNOSIS — K589 Irritable bowel syndrome without diarrhea: Secondary | ICD-10-CM | POA: Insufficient documentation

## 2019-02-06 DIAGNOSIS — I209 Angina pectoris, unspecified: Secondary | ICD-10-CM | POA: Diagnosis not present

## 2019-02-06 HISTORY — PX: LEFT HEART CATH AND CORONARY ANGIOGRAPHY: CATH118249

## 2019-02-06 LAB — GLUCOSE, CAPILLARY
Glucose-Capillary: 327 mg/dL — ABNORMAL HIGH (ref 70–99)
Glucose-Capillary: 254 mg/dL — ABNORMAL HIGH (ref 70–99)

## 2019-02-06 SURGERY — LEFT HEART CATH AND CORONARY ANGIOGRAPHY
Anesthesia: Moderate Sedation | Laterality: Left

## 2019-02-06 MED ORDER — ASPIRIN 81 MG PO CHEW: 81.0000 mg | CHEWABLE_TABLET | ORAL | Status: AC

## 2019-02-06 MED ORDER — SODIUM CHLORIDE 0.9% FLUSH: 3.0000 mL | INTRAVENOUS | Status: DC | PRN

## 2019-02-06 MED ORDER — HEPARIN (PORCINE) IN NACL 1000-0.9 UT/500ML-% IV SOLN
INTRAVENOUS | Status: DC | PRN
  Administered 2019-02-06: 500 mL

## 2019-02-06 MED ORDER — LABETALOL HCL 5 MG/ML IV SOLN: 10.0000 mg | INTRAVENOUS | Status: DC | PRN

## 2019-02-06 MED ORDER — SODIUM CHLORIDE 0.9% FLUSH: 3.0000 mL | Freq: Two times a day (BID) | INTRAVENOUS | Status: DC

## 2019-02-06 MED ORDER — HYDRALAZINE HCL 20 MG/ML IJ SOLN: 10.0000 mg | INTRAMUSCULAR | Status: DC | PRN

## 2019-02-06 MED ORDER — SODIUM CHLORIDE 0.9 % IV BOLUS
INTRAVENOUS | Status: AC | PRN
  Administered 2019-02-06: 500 mL via INTRAVENOUS

## 2019-02-06 MED ORDER — IOHEXOL 300 MG/ML  SOLN
INTRAMUSCULAR | Status: DC | PRN
  Administered 2019-02-06: 60 mL

## 2019-02-06 MED ORDER — SODIUM CHLORIDE 0.9 % WEIGHT BASED INFUSION: INTRAVENOUS | Status: AC

## 2019-02-06 MED ORDER — ACETAMINOPHEN 325 MG PO TABS: 650.0000 mg | ORAL_TABLET | ORAL | Status: DC | PRN

## 2019-02-06 MED ORDER — ASPIRIN 81 MG PO CHEW
CHEWABLE_TABLET | ORAL | Status: AC
  Administered 2019-02-06: 08:00:00 81 mg via ORAL
  Filled 2019-02-06: qty 1

## 2019-02-06 MED ORDER — SODIUM CHLORIDE 0.9 % WEIGHT BASED INFUSION: 1.0000 mL/kg/h | INTRAVENOUS | Status: DC

## 2019-02-06 MED ORDER — SODIUM CHLORIDE 0.9 % IV SOLN: 250.0000 mL | INTRAVENOUS | Status: DC | PRN

## 2019-02-06 MED ORDER — MIDAZOLAM HCL 2 MG/2ML IJ SOLN
INTRAMUSCULAR | Status: DC | PRN
  Administered 2019-02-06 (×2): 1 mg via INTRAVENOUS

## 2019-02-06 MED ORDER — SODIUM CHLORIDE 0.9 % WEIGHT BASED INFUSION: INTRAVENOUS | Status: DC

## 2019-02-06 MED ORDER — HEPARIN (PORCINE) IN NACL 1000-0.9 UT/500ML-% IV SOLN
INTRAVENOUS | Status: AC
  Filled 2019-02-06: qty 1000

## 2019-02-06 MED ORDER — ONDANSETRON HCL 4 MG/2ML IJ SOLN: 4.0000 mg | Freq: Four times a day (QID) | INTRAMUSCULAR | Status: DC | PRN

## 2019-02-06 MED ORDER — MIDAZOLAM HCL 2 MG/2ML IJ SOLN
INTRAMUSCULAR | Status: AC
  Filled 2019-02-06: qty 2

## 2019-02-06 SURGICAL SUPPLY — 9 items
CATH INFINITI 5FR ANG PIGTAIL (CATHETERS) ×2 IMPLANT
CATH INFINITI 5FR JL4 (CATHETERS) ×2 IMPLANT
CATH INFINITI JR4 5F (CATHETERS) ×2 IMPLANT
DEVICE CLOSURE MYNXGRIP 5F (Vascular Products) ×2 IMPLANT
KIT MANI 3VAL PERCEP (MISCELLANEOUS) ×2 IMPLANT
NEEDLE PERC 18GX7CM (NEEDLE) ×2 IMPLANT
PACK CARDIAC CATH (CUSTOM PROCEDURE TRAY) ×2 IMPLANT
SHEATH AVANTI 5FR X 11CM (SHEATH) ×4 IMPLANT
WIRE GUIDERIGHT .035X150 (WIRE) ×2 IMPLANT

## 2019-02-06 NOTE — Progress Notes (Signed)
3 attempts made at PIV, all unsuccessful. Dr. Clayborn Bigness notified.

## 2019-02-06 NOTE — Progress Notes (Signed)
Pt with right chest Port A Cath. Per Patient it was last accessed 1 Month ago at [facility]. Melven Sartorius RN attempted to access port this AM, no blood return and very difficult to insert. Pt site CDI, sensitive to touch. Dr. Clayborn Bigness notified. No orders at this time.

## 2019-02-13 ENCOUNTER — Encounter (INDEPENDENT_AMBULATORY_CARE_PROVIDER_SITE_OTHER): Payer: Self-pay

## 2019-02-18 ENCOUNTER — Ambulatory Visit (INDEPENDENT_AMBULATORY_CARE_PROVIDER_SITE_OTHER): Payer: Medicare HMO | Admitting: Urology

## 2019-02-18 ENCOUNTER — Encounter: Payer: Self-pay | Admitting: Urology

## 2019-02-18 ENCOUNTER — Other Ambulatory Visit: Payer: Self-pay

## 2019-02-18 VITALS — BP 167/107 | HR 85 | Ht 63.0 in | Wt 157.0 lb

## 2019-02-18 DIAGNOSIS — K7460 Unspecified cirrhosis of liver: Secondary | ICD-10-CM | POA: Diagnosis not present

## 2019-02-18 DIAGNOSIS — R32 Unspecified urinary incontinence: Secondary | ICD-10-CM | POA: Diagnosis not present

## 2019-02-18 DIAGNOSIS — N289 Disorder of kidney and ureter, unspecified: Secondary | ICD-10-CM

## 2019-02-18 LAB — URINALYSIS, COMPLETE
Bilirubin, UA: NEGATIVE
Ketones, UA: NEGATIVE
Leukocytes,UA: NEGATIVE
Nitrite, UA: NEGATIVE
Protein,UA: NEGATIVE
Specific Gravity, UA: >1.03 — ABNORMAL HIGH (ref 1.005–1.030)
Urobilinogen, Ur: 0.2 mg/dL (ref 0.2–1.0)
pH, UA: 5 (ref 5.0–7.5)

## 2019-02-18 LAB — BLADDER SCAN AMB NON-IMAGING

## 2019-02-18 LAB — MICROSCOPIC EXAMINATION
Bacteria, UA: NONE SEEN
RBC, Urine: NONE SEEN /hpf (ref 0–2)

## 2019-02-18 NOTE — Progress Notes (Signed)
02/18/2019 10:01 AM   Amber Christensen 01/13/1966 [id number]  Referring provider: [name], MD [address] [facility] [place],  [place] [address]  Chief Complaint  Patient presents with  . Urinary Incontinence  . Kidney cyst    HPI: I was consulted to assist the patient's chronic left lower quadrant pain.  She is gone to the emergency room with this.  The pain may lessen some during voiding but she was nonspecific.  She also states she gets vaginal burning when she voids.  This is been worse in the last year.  Cannot have intercourse due to pelvic pain  She can void every 30 minutes due to urgency and sometimes pressure.  She cannot hold it for 2 hours.  She gets up 5-6 times a night.  Sometimes she has urge incontinence.  She leaks with coughing sneezing but not bending lifting.  She has moderately severe bedwetting.  She wears 2 or 3 shields per day that are moderately wet  She reports a history of pelvic abscess.  It appears she had a mesh sling or mesh surgery and has had mesh removed possibly from her bladder and or bowel injury.  She had a CT scan in October which showed mild dilation of the right renal pelvis and right upper ureter slightly larger than the left side that was also prominent.  No blood in urine.  No urine infections documented recently  I reviewed the medical record from this month from [facility].Saw DR Ward in [facility] who felt she interstitial cystitis and started behavioral treatments  She has a lot of medical issues with a history of bipolar cirrhosis poorly controlled diabetes insulin pump and perhaps a bleeding disorder  Modifying factors: There are no other modifying factors  Associated signs and symptoms: There are no other associated signs and symptoms Aggravating and relieving factors: There are no other aggravating or relieving factors Severity: Moderate Duration: Persistent     PMH: Past Medical History:   Diagnosis Date  . Allergy    many many allergies  . Anemia    in past  . Anginal pain ([place]) 01/2018   cardiac workup clear  . Anxiety   . 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[place])    pt unaware of this diagnosis  . Bipolar disorder ([place])   . Bladder mass    removed and had tbt. mesh removed  . Blood transfusion without reported diagnosis 1999   received 20 units of blood after tearing esophagus from vomitting so severely after ERCP  . Breast mass    left side biopsy was negative  . Carpal tunnel syndrome    left hand  . Cervical cancer ([place]) 2002  . Chronic kidney disease    cyst on left kidney  . Chronic pancreatitis ([place])    prior to pancreas surgery  . Cirrhosis ([place])   . Clotting disorder ([place])    pt states that sometimes she has difficulty stopping to bleed and other times it is okay  . Depression   . Diabetes mellitus without complication (HCC)    has insulin pump  . Dyspnea   . Esophagitis   . GERD (gastroesophageal reflux disease)   . Headache    migraines...takes compazine for this  . Heart disease   . 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 Hypertension   . Irritable bowel syndrome   . Kidney mass  just watching. left kidney mass.  . Liver disease    NASH. has had liver biopsies. negative except for NASH  . NASH (nonalcoholic steatohepatitis)   . NASH (nonalcoholic steatohepatitis)   . Pancreatitis   . Stroke ([place]) 11/2017   had tia. no longer needs plavix  . Thyroid disease     Surgical History: Past Surgical History:  Procedure Laterality Date  . ABDOMINAL HYSTERECTOMY    . APPENDECTOMY    . BLADDER SURGERY     mesh removed. revision which has caused everything to fall again  . BOWEL RESECTION     took a piece of bowel out after pancreatectomy caused problem with bowel.   Marland Kitchen BREAST BIOPSY Left 1989   benign  . CARDIAC CATHETERIZATION  [id number],[id number], 1998   no stents, fatty streaks  .  CHOLECYSTECTOMY    . COLON SURGERY    . COLONOSCOPY WITH PROPOFOL N/A 10/08/2018   Procedure: COLONOSCOPY WITH PROPOFOL;  Surgeon: Lollie Sails, MD;  Location[facility] ENDOSCOPY;  Service: Endoscopy;  Laterality: N/A;  . CYST REMOVAL HAND Left 1992   Ganglion cyst removed from Left Wrist  . CYSTECTOMY    . DILATATION & CURETTAGE/HYSTEROSCOPY WITH MYOSURE    . DILATION AND CURETTAGE OF UTERUS    . ERCP    . ESOPHAGOGASTRODUODENOSCOPY    . HERNIA REPAIR    . KNEE ARTHROSCOPY Left 1992  . LEFT HEART CATH AND CORONARY ANGIOGRAPHY Left 02/06/2019   Procedure: LEFT HEART CATH AND CORONARY ANGIOGRAPHY;  Surgeon: Yolonda Kida, MD;  Location: [facility] CV LAB;  Service: Cardiovascular;  Laterality: Left;  . LIVER BIOPSY    . PANCREAS SURGERY  2006   partial pancreatectomy after endoscope knicked a part of pancreas.   Marland Kitchen PORTA CATH INSERTION N/A 04/10/2017   Procedure: PORTA CATH INSERTION;  Surgeon: Amber Huxley, MD;  Location: [place] [facility] CV LAB;  Service: Cardiovascular;  Laterality: N/A;  . REPAIR OF ESOPHAGUS  2012   3 clamps placed in esophagus  . ROBOTIC ASSISTED LAPAROSCOPIC VENTRAL/INCISIONAL HERNIA REPAIR N/A 03/15/2018   Procedure: ROBOTIC ASSISTED LAPAROSCOPIC VENTRAL HERNIA REPAIR;  Surgeon: Jules Husbands, MD;  Location: [facility] ORS;  Service: General;  Laterality: N/A;  . SMALL BOWEL REPAIR    . TONSILLECTOMY      Home Medications:  Allergies as of 02/18/2019      Reactions   Ciprofloxacin    Rash, nausea vomitting   Demerol [meperidine] Hives, Other (See Comments)   Pt states that this medication causes cardiac arrest.     Dilaudid [hydromorphone Hcl] Nausea And Vomiting, Other (See Comments)   Pt states that this medication causes cardiac arrest.     Metoclopramide Hives   Breathing issues   Morphine And Related Anaphylaxis, Other (See Comments)   Pt states that this medication causes cardiac arrest   Morpholine Salicylate Nausea And Vomiting, Other (See  Comments), Rash   CHF   Isosorbide Nitrate Other (See Comments)   Headache   Sulfa Antibiotics Nausea And Vomiting   Flagyl [metronidazole]    Rash, heart racing, nausea vomitting   Adhesive [tape] Rash   Blisters skin Paper tape is okay   Darvon [propoxyphene] Nausea And Vomiting, Rash   Flexeril [cyclobenzaprine] Nausea And Vomiting, Rash   Levaquin [levofloxacin In D5w] Nausea And Vomiting, Rash   Naproxen Rash   Ibuprofen and advil are not a problem   Soma [carisoprodol] Nausea And Vomiting, Rash   Zofran [ondansetron Hcl] Nausea And Vomiting,  Rash      Medication List       Accurate as of February 18, 2019 10:01 AM. If you have any questions, ask your nurse or doctor.        STOP taking these medications   Simethicone 20 MG/0.3ML Susp Stopped by: Reece Packer, MD     TAKE these medications   ACIDOPHILUS LACTOBACILLUS PO Take 1 capsule by mouth daily.   amLODipine 5 MG tablet Commonly known as: NORVASC Take 5 mg by mouth daily.   CALCIUM 600/VITAMIN D3 PO Take 1 tablet by mouth daily.   Creon 36000 UNITS Cpep capsule Generic drug: lipase/protease/amylase Take 144,000 Units by mouth 3 (three) times daily before meals.   cyanocobalamin 2000 MCG tablet Take 2,000 mcg by mouth daily.   Dexlansoprazole 30 MG capsule Take 30 mg by mouth daily.   dicyclomine 10 MG capsule Commonly known as: BENTYL Take 20 mg by mouth 4 (four) times daily as needed for spasms.   gabapentin 300 MG capsule Commonly known as: NEURONTIN Take 2 capsules (600 mg total) by mouth 3 (three) times daily. What changed:   when to take this  reasons to take this   hydrochlorothiazide 12.5 MG tablet Commonly known as: HYDRODIURIL Take 1 tablet (12.5 mg total) by mouth daily as needed (for fluid).   lisinopril 40 MG tablet Commonly known as: ZESTRIL Take 40 mg by mouth daily.   metoprolol succinate 25 MG 24 hr tablet Commonly known as: TOPROL-XL Take 25 mg by mouth daily.    NovoLOG 100 UNIT/ML injection Generic drug: insulin aspart Inject 0-7 Units into the skin See admin instructions. Sliding scale   V-R MAGNESIUM 250 MG Tabs Generic drug: Magnesium Take 250 mg by mouth daily.   Vitamin D-3 125 MCG (5000 UT) Tabs Take 5,000 Units by mouth daily.   vitamin E 400 UNIT capsule Take 400 Units by mouth daily.       Allergies:  Allergies  Allergen Reactions  . Ciprofloxacin     Rash, nausea vomitting  . Demerol [Meperidine] Hives and Other (See Comments)    Pt states that this medication causes cardiac arrest.    . Dilaudid [Hydromorphone Hcl] Nausea And Vomiting and Other (See Comments)    Pt states that this medication causes cardiac arrest.    . Metoclopramide Hives    Breathing issues  . Morphine And Related Anaphylaxis and Other (See Comments)    Pt states that this medication causes cardiac arrest  . Morpholine Salicylate Nausea And Vomiting, Other (See Comments) and Rash    CHF  . Isosorbide Nitrate Other (See Comments)    Headache   . Sulfa Antibiotics Nausea And Vomiting  . Flagyl [Metronidazole]     Rash, heart racing, nausea vomitting  . Adhesive [Tape] Rash    Blisters skin Paper tape is okay  . Darvon [Propoxyphene] Nausea And Vomiting and Rash  . Flexeril [Cyclobenzaprine] Nausea And Vomiting and Rash  . Levaquin [Levofloxacin In D5w] Nausea And Vomiting and Rash  . Naproxen Rash    Ibuprofen and advil are not a problem  . Soma [Carisoprodol] Nausea And Vomiting and Rash  . Zofran [Ondansetron Hcl] Nausea And Vomiting and Rash    Family History: Family History  Problem Relation Age of Onset  . Cirrhosis Mother   . Colon cancer Mother 51       TAH/BSO in ~61; deceased at 71  . Hypertension Mother   . Breast cancer Mother 14  unconfirmed  . Hypertension Father   . Prostate cancer Father 74       currently 49  . Other Father        liver disease  . Breast cancer Maternal Aunt        age at dx unk.; currently  54  . Cervical cancer Maternal Aunt   . Cancer Maternal Uncle        unk. type; currently 45s  . Stomach cancer Paternal Aunt 52       deceased at 73  . Breast cancer Maternal Grandmother 43       possibly bilateral in 53s; deceased 18  . Non-Hodgkin's lymphoma Daughter        unconfirmed; currently 30  . Cancer Maternal Aunt        hematologic malignancy; deceased 83    Social History:  reports that she has never smoked. She has never used smokeless tobacco. She reports current alcohol use. She reports that she does not use drugs.  ROS: UROLOGY Frequent Urination?: Yes Hard to postpone urination?: Yes Burning/pain with urination?: Yes Get up at night to urinate?: Yes Leakage of urine?: Yes Urine stream starts and stops?: Yes Trouble starting stream?: No Do you have to strain to urinate?: No Blood in urine?: No Urinary tract infection?: Yes Sexually transmitted disease?: No Injury to kidneys or bladder?: Yes Painful intercourse?: Yes Weak stream?: Yes Currently pregnant?: No Vaginal bleeding?: No Last menstrual period?: n  Gastrointestinal Nausea?: No Vomiting?: No Indigestion/heartburn?: No Diarrhea?: No Constipation?: No  Constitutional Fever: No Night sweats?: Yes Weight loss?: No Fatigue?: Yes  Skin Skin rash/lesions?: No Itching?: No  Eyes Blurred vision?: Yes Double vision?: Yes  Ears/Nose/Throat Sore throat?: No Sinus problems?: No  Hematologic/Lymphatic Swollen glands?: No Easy bruising?: No  Cardiovascular Leg swelling?: No Chest pain?: No  Respiratory Cough?: No Shortness of breath?: No  Endocrine Excessive thirst?: No  Musculoskeletal Back pain?: No Joint pain?: No  Neurological Headaches?: No Dizziness?: No  Psychologic Depression?: No Anxiety?: No  Physical Exam: BP (!) 167/107   Pulse 85   Ht 5' 3"  (1.6 m)   Wt 157 lb (71.2 kg)   BMI 27.81 kg/m   Constitutional:  Alert and oriented, No acute distress. HEENT:  [place] AT, moist mucus membranes.  Trachea midline, no masses. Cardiovascular: No clubbing, cyanosis, or edema. Respiratory: Normal respiratory effort, no increased work of breathing. GI: Abdomen is soft, nontender, nondistended, no abdominal masses GU: No CVA tenderness.  Patient was difficult to examine and had a narrow introitus.  She appeared to have good vaginal length and no prolapse.  Anteriorly she was a bit tender but she was very tender at the level of the introitus.  I felt a band to the right of the midline at the introitus on the right side and she may have had previous mesh posteriorly but again she was narrow and difficult to examine due to tenderness Skin: No rashes, bruises or suspicious lesions. Lymph: No cervical or inguinal adenopathy. Neurologic: Grossly intact, no focal deficits, moving all 4 extremities. Psychiatric: Normal mood and affect.  Laboratory Data: Lab Results  Component Value Date   WBC 13.1 (H) 12/08/2018   HGB 13.3 12/08/2018   HCT 37.0 12/08/2018   MCV 81.1 12/08/2018   PLT 163 12/08/2018    Lab Results  Component Value Date   CREATININE 0.91 12/08/2018    No results found for: PSA  No results found for: TESTOSTERONE  Lab Results  Component  Value Date   HGBA1C 9.4 (H) 09/23/2018    Urinalysis    Component Value Date/Time   COLORURINE STRAW (A) 12/08/2018 0002   APPEARANCEUR CLEAR (A) 12/08/2018 0002   APPEARANCEUR Hazy 01/31/2014 1455   LABSPEC 1.015 12/08/2018 0002   LABSPEC 1.018 01/31/2014 1455   PHURINE 5.0 12/08/2018 0002   GLUCOSEU NEGATIVE 12/08/2018 0002   GLUCOSEU >=500 01/31/2014 1455   HGBUR NEGATIVE 12/08/2018 0002   BILIRUBINUR NEGATIVE 12/08/2018 0002   BILIRUBINUR Negative 01/31/2014 1455   KETONESUR NEGATIVE 12/08/2018 0002   PROTEINUR NEGATIVE 12/08/2018 0002   NITRITE NEGATIVE 12/08/2018 0002   LEUKOCYTESUR NEGATIVE 12/08/2018 0002   LEUKOCYTESUR Negative 01/31/2014 1455    Pertinent Imaging:   Assessment &  Plan: Patient has chronic left lower abdominal pain and has had a CT scan within normal limits.  She has vaginal burning and has not been given creams for her vaginitis.  She is very tender.  She has had previous bladder surgery and could have interstitial cystitis.  She has mixed incontinence severe frequency and nocturia.  She also has bedwetting.  In the distant past she has had bladder medications but did not know the names.  I reviewed medical record.  I reviewed her urinalysis.  I sent the urine for culture.  I thought it was reasonable to recommend a bladder hydrodistention.  I could also examine her better under anesthesia for any mesh.  Usual template discussed  She understands that many of her symptoms are chronic and may be difficult to address.  Poorly controlled diabetes will make voiding dysfunction more difficult to treat  Hent1. Kidney lesion  - Urinalysis, Complete   No follow-ups on file.  Reece Packer, MD  [facility] [address], [place], [place] [address] [phone]

## 2019-02-19 ENCOUNTER — Other Ambulatory Visit
Admission: RE | Admit: 2019-02-19 | Discharge: 2019-02-19 | Disposition: A | Discharge status: Home / Self Care | Payer: Medicare HMO | Source: Ambulatory Visit | Attending: Vascular Surgery | Admitting: Vascular Surgery

## 2019-02-19 DIAGNOSIS — Z01812 Encounter for preprocedural laboratory examination: Secondary | ICD-10-CM | POA: Insufficient documentation

## 2019-02-19 DIAGNOSIS — I208 Other forms of angina pectoris: Secondary | ICD-10-CM | POA: Diagnosis not present

## 2019-02-19 DIAGNOSIS — Z20822 Contact with and (suspected) exposure to covid-19: Secondary | ICD-10-CM | POA: Diagnosis not present

## 2019-02-19 DIAGNOSIS — I10 Essential (primary) hypertension: Secondary | ICD-10-CM | POA: Diagnosis not present

## 2019-02-19 DIAGNOSIS — K2100 Gastro-esophageal reflux disease with esophagitis, without bleeding: Secondary | ICD-10-CM | POA: Diagnosis not present

## 2019-02-19 DIAGNOSIS — R0602 Shortness of breath: Secondary | ICD-10-CM | POA: Diagnosis not present

## 2019-02-19 DIAGNOSIS — K7581 Nonalcoholic steatohepatitis (NASH): Secondary | ICD-10-CM | POA: Diagnosis not present

## 2019-02-19 DIAGNOSIS — R0789 Other chest pain: Secondary | ICD-10-CM | POA: Diagnosis not present

## 2019-02-19 DIAGNOSIS — Z87891 Personal history of nicotine dependence: Secondary | ICD-10-CM | POA: Diagnosis not present

## 2019-02-19 DIAGNOSIS — Z8673 Personal history of transient ischemic attack (TIA), and cerebral infarction without residual deficits: Secondary | ICD-10-CM | POA: Diagnosis not present

## 2019-02-20 ENCOUNTER — Other Ambulatory Visit: Payer: Self-pay | Admitting: Urology

## 2019-02-20 ENCOUNTER — Other Ambulatory Visit (INDEPENDENT_AMBULATORY_CARE_PROVIDER_SITE_OTHER): Payer: Self-pay | Admitting: Nurse Practitioner

## 2019-02-20 DIAGNOSIS — N3020 Other chronic cystitis without hematuria: Secondary | ICD-10-CM

## 2019-02-20 LAB — SARS CORONAVIRUS 2 (TAT 6-24 HRS): SARS Coronavirus 2: NEGATIVE

## 2019-02-20 MED ORDER — SODIUM CHLORIDE 0.9 % IV SOLN
Freq: Once | INTRAVENOUS | Status: AC
  Filled 2019-02-20: qty 80
  Filled 2019-02-20: qty 2

## 2019-02-21 ENCOUNTER — Encounter
Admission: RE | Disposition: A | Discharge status: Home / Self Care | Payer: Self-pay | Source: Home / Self Care | Attending: Vascular Surgery

## 2019-02-21 ENCOUNTER — Encounter: Payer: Self-pay | Admitting: Cardiology

## 2019-02-21 ENCOUNTER — Other Ambulatory Visit: Payer: Self-pay

## 2019-02-21 ENCOUNTER — Other Ambulatory Visit: Payer: Self-pay | Admitting: Urology

## 2019-02-21 ENCOUNTER — Ambulatory Visit
Admission: RE | Admit: 2019-02-21 | Discharge: 2019-02-21 | Disposition: A | Discharge status: Home / Self Care | Payer: Medicare HMO | Attending: Vascular Surgery | Admitting: Vascular Surgery

## 2019-02-21 DIAGNOSIS — I1310 Hypertensive heart and chronic kidney disease without heart failure, with stage 1 through stage 4 chronic kidney disease, or unspecified chronic kidney disease: Secondary | ICD-10-CM | POA: Insufficient documentation

## 2019-02-21 DIAGNOSIS — N189 Chronic kidney disease, unspecified: Secondary | ICD-10-CM | POA: Insufficient documentation

## 2019-02-21 DIAGNOSIS — E079 Disorder of thyroid, unspecified: Secondary | ICD-10-CM | POA: Diagnosis not present

## 2019-02-21 DIAGNOSIS — Z886 Allergy status to analgesic agent status: Secondary | ICD-10-CM | POA: Diagnosis not present

## 2019-02-21 DIAGNOSIS — K7581 Nonalcoholic steatohepatitis (NASH): Secondary | ICD-10-CM | POA: Insufficient documentation

## 2019-02-21 DIAGNOSIS — Z452 Encounter for adjustment and management of vascular access device: Secondary | ICD-10-CM | POA: Insufficient documentation

## 2019-02-21 DIAGNOSIS — Z79899 Other long term (current) drug therapy: Secondary | ICD-10-CM | POA: Insufficient documentation

## 2019-02-21 DIAGNOSIS — E1122 Type 2 diabetes mellitus with diabetic chronic kidney disease: Secondary | ICD-10-CM | POA: Insufficient documentation

## 2019-02-21 DIAGNOSIS — Z8379 Family history of other diseases of the digestive system: Secondary | ICD-10-CM | POA: Insufficient documentation

## 2019-02-21 DIAGNOSIS — Z885 Allergy status to narcotic agent status: Secondary | ICD-10-CM | POA: Insufficient documentation

## 2019-02-21 DIAGNOSIS — K219 Gastro-esophageal reflux disease without esophagitis: Secondary | ICD-10-CM | POA: Diagnosis not present

## 2019-02-21 DIAGNOSIS — Z794 Long term (current) use of insulin: Secondary | ICD-10-CM | POA: Diagnosis not present

## 2019-02-21 DIAGNOSIS — Z882 Allergy status to sulfonamides status: Secondary | ICD-10-CM | POA: Diagnosis not present

## 2019-02-21 DIAGNOSIS — N3020 Other chronic cystitis without hematuria: Secondary | ICD-10-CM | POA: Insufficient documentation

## 2019-02-21 DIAGNOSIS — K589 Irritable bowel syndrome without diarrhea: Secondary | ICD-10-CM | POA: Diagnosis not present

## 2019-02-21 DIAGNOSIS — G5602 Carpal tunnel syndrome, left upper limb: Secondary | ICD-10-CM | POA: Insufficient documentation

## 2019-02-21 DIAGNOSIS — H209 Unspecified iridocyclitis: Secondary | ICD-10-CM

## 2019-02-21 DIAGNOSIS — Z881 Allergy status to other antibiotic agents status: Secondary | ICD-10-CM | POA: Diagnosis not present

## 2019-02-21 DIAGNOSIS — I878 Other specified disorders of veins: Secondary | ICD-10-CM | POA: Insufficient documentation

## 2019-02-21 DIAGNOSIS — Z8673 Personal history of transient ischemic attack (TIA), and cerebral infarction without residual deficits: Secondary | ICD-10-CM | POA: Diagnosis not present

## 2019-02-21 DIAGNOSIS — Z8249 Family history of ischemic heart disease and other diseases of the circulatory system: Secondary | ICD-10-CM | POA: Diagnosis not present

## 2019-02-21 DIAGNOSIS — Z888 Allergy status to other drugs, medicaments and biological substances status: Secondary | ICD-10-CM | POA: Insufficient documentation

## 2019-02-21 HISTORY — PX: PORTA CATH INSERTION: CATH118285

## 2019-02-21 LAB — GLUCOSE, CAPILLARY: Glucose-Capillary: 169 mg/dL — ABNORMAL HIGH (ref 70–99)

## 2019-02-21 SURGERY — PORTA CATH INSERTION
Anesthesia: Moderate Sedation

## 2019-02-21 MED ORDER — SODIUM CHLORIDE 0.9 % IV SOLN: INTRAVENOUS | Status: DC

## 2019-02-21 MED ORDER — DIPHENHYDRAMINE HCL 50 MG/ML IJ SOLN: 50.0000 mg | Freq: Once | INTRAMUSCULAR | Status: DC | PRN

## 2019-02-21 MED ORDER — CEFAZOLIN SODIUM-DEXTROSE 2-4 GM/100ML-% IV SOLN
2.0000 g | Freq: Once | INTRAVENOUS | Status: AC
  Administered 2019-02-21: 2 g via INTRAVENOUS

## 2019-02-21 MED ORDER — MIDAZOLAM HCL 2 MG/2ML IJ SOLN
INTRAMUSCULAR | Status: AC
  Filled 2019-02-21: qty 4

## 2019-02-21 MED ORDER — MIDAZOLAM HCL 2 MG/ML PO SYRP: 8.0000 mg | ORAL_SOLUTION | Freq: Once | ORAL | Status: DC | PRN

## 2019-02-21 MED ORDER — METHYLPREDNISOLONE SODIUM SUCC 125 MG IJ SOLR: 125.0000 mg | Freq: Once | INTRAMUSCULAR | Status: DC | PRN

## 2019-02-21 MED ORDER — FAMOTIDINE 20 MG PO TABS: 40.0000 mg | ORAL_TABLET | Freq: Once | ORAL | Status: DC | PRN

## 2019-02-21 MED ORDER — MIDAZOLAM HCL 2 MG/2ML IJ SOLN
INTRAMUSCULAR | Status: DC | PRN
  Administered 2019-02-21: 2 mg via INTRAVENOUS

## 2019-02-21 SURGICAL SUPPLY — 10 items
DERMABOND ADVANCED (GAUZE/BANDAGES/DRESSINGS) ×1
DERMABOND ADVANCED .7 DNX12 (GAUZE/BANDAGES/DRESSINGS) ×1 IMPLANT
KIT PORT POWER 8FR ISP CVUE (Port) ×2 IMPLANT
PACK ANGIOGRAPHY (CUSTOM PROCEDURE TRAY) ×2 IMPLANT
PENCIL ELECTRO HAND CTR (MISCELLANEOUS) ×2 IMPLANT
SUT MNCRL AB 4-0 PS2 18 (SUTURE) ×2 IMPLANT
SUT PROLENE 0 CT 1 30 (SUTURE) ×2 IMPLANT
SUT VIC AB 3-0 CT1 27 (SUTURE) ×1
SUT VIC AB 3-0 CT1 TAPERPNT 27 (SUTURE) ×1 IMPLANT
WIRE MAGIC TOR.035 180C (WIRE) ×2 IMPLANT

## 2019-02-21 NOTE — Progress Notes (Signed)
Unable to obtain IV access. MD made aware. He will access in vascular lab.

## 2019-02-21 NOTE — H&P (Signed)
[facility] History & Physical Update  The patient was interviewed and re-examined.  The patient's previous History and Physical has been reviewed and is unchanged.  There is no change in the plan of care. We plan to proceed with the scheduled procedure.  Leotis Pain, MD  02/21/2019, 8:05 AM

## 2019-02-21 NOTE — Op Note (Signed)
[facility]       Operative Note  Date: 02/21/2019  Preoperative diagnosis:  1.  Chronic cystitis, recent cardiac cath, extremely limited venous access with a nonfunctional Port-A-Cath  Postoperative diagnosis:  Same as above  Procedures: 1.  Removal of right jugular port a cath  2.  Placement of right jugular Port-A-Cath using fluoroscopic guidance  Surgeon: Leotis Pain, MD  Anesthesia: Local with moderate conscious sedation for 20 minutes using 2 mg of Versed  Fluoroscopy time: 0.4 minutes  Contrast used: 0  Estimated blood loss: Minimal  Indication for the procedure:  The patient is a 54 y.o. female who has chronic cystitis and multiple other medical issues and has no other acceptable venous access and needs a functional Port-A-Cath. The patient desires to have this removed and replaced. Risks and benefits including need for potential replacement with recurrent disease were discussed and patient is agreeable to proceed.  Description of procedure: The patient was brought to the vascular and interventional radiology suite. Moderate conscious sedation was administered during a face to face encounter with the patient throughout the procedure with my supervision of the RN administering medicines and monitoring the patient's vital signs, pulse oximetry, telemetry and mental status throughout from the start of the procedure until the patient was taken to the recovery room.  The right neck chest and shoulder were sterilely prepped and draped, and a sterile surgical field was created. The area was then anesthetized with 1% lidocaine copiously. The previous incision was reopened and electrocautery used to dissected down to the port and the catheter. The port was dissected out from the fibrous connective tissue and the Prolene sutures were removed. The port was then removed and the catheter was transected just below junction to the port.   A Magic torque wire was then placed through the existing catheter and run down through the atrium into the IVC and the remainder of the old catheter was removed.  A new catheter was then placed over the wire and parked at the cavoatrial junction and cut to an appropriate length.  A new Port-A-Cath was then connected to the catheter and placed into the pocket securing in place with 2 Prolene sutures.  The new port withdrew blood well and flushed easily with heparinized saline.  Wound was then copiously irrigated. The wound was then closed with a 3-0 Vicryl and a 4-0 Monocryl and Dermabond was placed as a dressing. The patient was then taken to the recovery room in stable condition having tolerated the procedure well.  Complications: none  Condition: stable   Leotis Pain, MD 02/21/2019 10:16 AM   This note was created with Dragon Medical transcription system. Any errors in dictation are purely unintentional.

## 2019-02-22 LAB — CULTURE, URINE COMPREHENSIVE

## 2019-02-28 ENCOUNTER — Other Ambulatory Visit
Admission: RE | Admit: 2019-02-28 | Discharge: 2019-02-28 | Disposition: A | Discharge status: Home / Self Care | Payer: Medicare HMO | Source: Ambulatory Visit | Attending: Urology | Admitting: Urology

## 2019-02-28 ENCOUNTER — Other Ambulatory Visit: Payer: Self-pay

## 2019-02-28 ENCOUNTER — Encounter
Admission: RE | Admit: 2019-02-28 | Discharge: 2019-02-28 | Disposition: A | Discharge status: Home / Self Care | Payer: Medicare HMO | Source: Ambulatory Visit | Attending: Urology | Admitting: Urology

## 2019-02-28 DIAGNOSIS — Z01812 Encounter for preprocedural laboratory examination: Secondary | ICD-10-CM | POA: Insufficient documentation

## 2019-02-28 DIAGNOSIS — Z20822 Contact with and (suspected) exposure to covid-19: Secondary | ICD-10-CM | POA: Diagnosis not present

## 2019-02-28 HISTORY — DX: Nausea with vomiting, unspecified: R11.2

## 2019-02-28 HISTORY — DX: Nausea with vomiting, unspecified: Z98.890

## 2019-02-28 LAB — SARS CORONAVIRUS 2 (TAT 6-24 HRS): SARS Coronavirus 2: NEGATIVE

## 2019-02-28 NOTE — Pre-Procedure Instructions (Signed)
Medical and surgical history including recent high glucose levels and recent procedures discussed with Dr. Ronelle Nigh (anesthesia). Decision was made to acquire medical clearance from patient's PCP Dr. Ginette Pitman. Clearance form faxed to Dr. Ginette Pitman. Dr. Matilde Sprang office made aware of the clearance.

## 2019-02-28 NOTE — Patient Instructions (Addendum)
Your procedure is scheduled on: Monday, January 18 Report to Day Surgery on the 2nd floor of the [facility]. To find out your arrival time, please call [phone] between 1PM - 3PM on: Friday, January 15  REMEMBER: Instructions that are not followed completely may result in serious medical risk, up to and including death; or upon the discretion of your surgeon and anesthesiologist your surgery may need to be rescheduled.  Do not eat food after midnight the night before surgery.  No gum chewing, lozengers or hard candies.  You may however, drink water up to 2 hours before you are scheduled to arrive for your surgery. Do not drink anything within 2 hours of the start of your surgery.  No Alcohol for 24 hours before or after surgery.  No Smoking including e-cigarettes for 24 hours prior to surgery.  No chewable tobacco products for at least 6 hours prior to surgery.  No nicotine patches on the day of surgery.  On the morning of surgery brush your teeth with toothpaste and water, you may rinse your mouth with mouthwash if you wish. Do not swallow any toothpaste or mouthwash.  Notify your doctor if there is any change in your medical condition (cold, fever, infection).  Do not wear jewelry, make-up, hairpins, clips or nail polish.  Do not wear lotions, powders, or perfumes.   Do not shave 48 hours prior to surgery.   Contacts and dentures may not be worn into surgery.  Do not bring valuables to the hospital, including drivers license, insurance or credit cards.  [facility] is not responsible for any belongings or valuables.   TAKE THESE MEDICATIONS THE MORNING OF SURGERY:  1.  Amlodipine 2.  dexlansoprazole - (take one the night before and one on the morning of surgery - helps to prevent nausea after surgery.) 3.  Metoprolol  Do Not take any additional insulin on the morning of surgery but leave the insulin pump on.  Stop Anti-inflammatories (NSAIDS) such as Advil, Aleve,  Ibuprofen, Motrin, Naproxen, Naprosyn and Aspirin based products such as Excedrin, Goodys Powder, BC Powder. (May take Tylenol or Acetaminophen if needed.)  Stop ANY OVER THE COUNTER supplements until after surgery. Stop magnesium and Vitamin E. (May continue Vitamin D, Vitamin B, and multivitamin.)  Wear comfortable clothing (specific to your surgery type) to the hospital.  If you are being discharged the day of surgery, you will not be allowed to drive home. You will need a responsible adult to drive you home and stay with you that night.   If you are taking public transportation, you will need to have a responsible adult with you. Please confirm with your physician that it is acceptable to use public transportation.   Please call [phone] if you have any questions about these instructions.

## 2019-03-01 ENCOUNTER — Other Ambulatory Visit: Payer: Medicare HMO

## 2019-03-04 ENCOUNTER — Ambulatory Visit
Admission: RE | Admit: 2019-03-04 | Discharge: 2019-03-04 | Disposition: A | Discharge status: Home / Self Care | Payer: Medicare HMO | Attending: Urology | Admitting: Urology

## 2019-03-04 ENCOUNTER — Ambulatory Visit: Payer: Medicare HMO | Admitting: Anesthesiology

## 2019-03-04 ENCOUNTER — Encounter
Admission: RE | Disposition: A | Discharge status: Home / Self Care | Payer: Self-pay | Source: Home / Self Care | Attending: Urology

## 2019-03-04 ENCOUNTER — Encounter: Payer: Self-pay | Admitting: Urology

## 2019-03-04 DIAGNOSIS — I129 Hypertensive chronic kidney disease with stage 1 through stage 4 chronic kidney disease, or unspecified chronic kidney disease: Secondary | ICD-10-CM | POA: Diagnosis not present

## 2019-03-04 DIAGNOSIS — Z807 Family history of other malignant neoplasms of lymphoid, hematopoietic and related tissues: Secondary | ICD-10-CM | POA: Insufficient documentation

## 2019-03-04 DIAGNOSIS — Z91048 Other nonmedicinal substance allergy status: Secondary | ICD-10-CM | POA: Insufficient documentation

## 2019-03-04 DIAGNOSIS — N189 Chronic kidney disease, unspecified: Secondary | ICD-10-CM | POA: Diagnosis not present

## 2019-03-04 DIAGNOSIS — R0602 Shortness of breath: Secondary | ICD-10-CM | POA: Diagnosis not present

## 2019-03-04 DIAGNOSIS — Z803 Family history of malignant neoplasm of breast: Secondary | ICD-10-CM | POA: Insufficient documentation

## 2019-03-04 DIAGNOSIS — Z90710 Acquired absence of both cervix and uterus: Secondary | ICD-10-CM | POA: Insufficient documentation

## 2019-03-04 DIAGNOSIS — N3010 Interstitial cystitis (chronic) without hematuria: Secondary | ICD-10-CM

## 2019-03-04 DIAGNOSIS — K7581 Nonalcoholic steatohepatitis (NASH): Secondary | ICD-10-CM | POA: Insufficient documentation

## 2019-03-04 DIAGNOSIS — E079 Disorder of thyroid, unspecified: Secondary | ICD-10-CM | POA: Insufficient documentation

## 2019-03-04 DIAGNOSIS — Z9049 Acquired absence of other specified parts of digestive tract: Secondary | ICD-10-CM | POA: Insufficient documentation

## 2019-03-04 DIAGNOSIS — D66 Hereditary factor VIII deficiency: Secondary | ICD-10-CM | POA: Diagnosis not present

## 2019-03-04 DIAGNOSIS — N3020 Other chronic cystitis without hematuria: Secondary | ICD-10-CM

## 2019-03-04 DIAGNOSIS — I209 Angina pectoris, unspecified: Secondary | ICD-10-CM | POA: Diagnosis not present

## 2019-03-04 DIAGNOSIS — Z886 Allergy status to analgesic agent status: Secondary | ICD-10-CM | POA: Insufficient documentation

## 2019-03-04 DIAGNOSIS — K861 Other chronic pancreatitis: Secondary | ICD-10-CM | POA: Insufficient documentation

## 2019-03-04 DIAGNOSIS — E1122 Type 2 diabetes mellitus with diabetic chronic kidney disease: Secondary | ICD-10-CM | POA: Diagnosis not present

## 2019-03-04 DIAGNOSIS — F419 Anxiety disorder, unspecified: Secondary | ICD-10-CM | POA: Insufficient documentation

## 2019-03-04 DIAGNOSIS — Z8379 Family history of other diseases of the digestive system: Secondary | ICD-10-CM | POA: Insufficient documentation

## 2019-03-04 DIAGNOSIS — Z8673 Personal history of transient ischemic attack (TIA), and cerebral infarction without residual deficits: Secondary | ICD-10-CM | POA: Diagnosis not present

## 2019-03-04 DIAGNOSIS — Z9641 Presence of insulin pump (external) (internal): Secondary | ICD-10-CM | POA: Insufficient documentation

## 2019-03-04 DIAGNOSIS — Z800 Family history of malignant neoplasm of digestive organs: Secondary | ICD-10-CM | POA: Insufficient documentation

## 2019-03-04 DIAGNOSIS — Z8541 Personal history of malignant neoplasm of cervix uteri: Secondary | ICD-10-CM | POA: Insufficient documentation

## 2019-03-04 DIAGNOSIS — N3944 Nocturnal enuresis: Secondary | ICD-10-CM | POA: Insufficient documentation

## 2019-03-04 DIAGNOSIS — G43909 Migraine, unspecified, not intractable, without status migrainosus: Secondary | ICD-10-CM | POA: Insufficient documentation

## 2019-03-04 DIAGNOSIS — K589 Irritable bowel syndrome without diarrhea: Secondary | ICD-10-CM | POA: Insufficient documentation

## 2019-03-04 DIAGNOSIS — E271 Primary adrenocortical insufficiency: Secondary | ICD-10-CM | POA: Diagnosis not present

## 2019-03-04 DIAGNOSIS — F319 Bipolar disorder, unspecified: Secondary | ICD-10-CM | POA: Diagnosis not present

## 2019-03-04 DIAGNOSIS — N3946 Mixed incontinence: Secondary | ICD-10-CM | POA: Insufficient documentation

## 2019-03-04 DIAGNOSIS — Z90411 Acquired partial absence of pancreas: Secondary | ICD-10-CM | POA: Insufficient documentation

## 2019-03-04 DIAGNOSIS — Z809 Family history of malignant neoplasm, unspecified: Secondary | ICD-10-CM | POA: Insufficient documentation

## 2019-03-04 DIAGNOSIS — D631 Anemia in chronic kidney disease: Secondary | ICD-10-CM | POA: Insufficient documentation

## 2019-03-04 DIAGNOSIS — Z794 Long term (current) use of insulin: Secondary | ICD-10-CM | POA: Diagnosis not present

## 2019-03-04 DIAGNOSIS — K219 Gastro-esophageal reflux disease without esophagitis: Secondary | ICD-10-CM | POA: Diagnosis not present

## 2019-03-04 DIAGNOSIS — K2100 Gastro-esophageal reflux disease with esophagitis, without bleeding: Secondary | ICD-10-CM | POA: Diagnosis not present

## 2019-03-04 DIAGNOSIS — Z79899 Other long term (current) drug therapy: Secondary | ICD-10-CM | POA: Insufficient documentation

## 2019-03-04 DIAGNOSIS — Z882 Allergy status to sulfonamides status: Secondary | ICD-10-CM | POA: Insufficient documentation

## 2019-03-04 DIAGNOSIS — Z888 Allergy status to other drugs, medicaments and biological substances status: Secondary | ICD-10-CM | POA: Insufficient documentation

## 2019-03-04 DIAGNOSIS — Z881 Allergy status to other antibiotic agents status: Secondary | ICD-10-CM | POA: Insufficient documentation

## 2019-03-04 DIAGNOSIS — Z8049 Family history of malignant neoplasm of other genital organs: Secondary | ICD-10-CM | POA: Insufficient documentation

## 2019-03-04 DIAGNOSIS — N760 Acute vaginitis: Secondary | ICD-10-CM | POA: Insufficient documentation

## 2019-03-04 DIAGNOSIS — Z885 Allergy status to narcotic agent status: Secondary | ICD-10-CM | POA: Insufficient documentation

## 2019-03-04 DIAGNOSIS — Z8249 Family history of ischemic heart disease and other diseases of the circulatory system: Secondary | ICD-10-CM | POA: Insufficient documentation

## 2019-03-04 HISTORY — PX: CYSTO WITH HYDRODISTENSION: SHX5453

## 2019-03-04 LAB — GLUCOSE, CAPILLARY
Glucose-Capillary: 156 mg/dL — ABNORMAL HIGH (ref 70–99)
Glucose-Capillary: 140 mg/dL — ABNORMAL HIGH (ref 70–99)
Glucose-Capillary: 135 mg/dL — ABNORMAL HIGH (ref 70–99)

## 2019-03-04 SURGERY — CYSTOSCOPY, WITH BLADDER HYDRODISTENSION
Anesthesia: General | Site: Bladder

## 2019-03-04 MED ORDER — FENTANYL CITRATE (PF) 100 MCG/2ML IJ SOLN
INTRAMUSCULAR | Status: DC | PRN
  Administered 2019-03-04 (×2): 50 ug via INTRAVENOUS

## 2019-03-04 MED ORDER — BUPIVACAINE HCL 0.5 % IJ SOLN
INTRAMUSCULAR | Status: DC | PRN
  Administered 2019-03-04: 30 mL

## 2019-03-04 MED ORDER — ONDANSETRON HCL 4 MG/2ML IJ SOLN: 4.0000 mg | Freq: Once | INTRAMUSCULAR | Status: DC | PRN

## 2019-03-04 MED ORDER — CEFAZOLIN SODIUM-DEXTROSE 1-4 GM/50ML-% IV SOLN
INTRAVENOUS | Status: AC
  Filled 2019-03-04: qty 50

## 2019-03-04 MED ORDER — BUPIVACAINE HCL (PF) 0.5 % IJ SOLN
INTRAMUSCULAR | Status: AC
  Filled 2019-03-04: qty 30

## 2019-03-04 MED ORDER — FENTANYL CITRATE (PF) 100 MCG/2ML IJ SOLN
INTRAMUSCULAR | Status: AC
  Filled 2019-03-04: qty 2

## 2019-03-04 MED ORDER — MIDAZOLAM HCL 2 MG/2ML IJ SOLN
INTRAMUSCULAR | Status: AC
  Filled 2019-03-04: qty 2

## 2019-03-04 MED ORDER — GLYCOPYRROLATE 0.2 MG/ML IJ SOLN
INTRAMUSCULAR | Status: DC | PRN
  Administered 2019-03-04: .2 mg via INTRAVENOUS

## 2019-03-04 MED ORDER — FENTANYL CITRATE (PF) 100 MCG/2ML IJ SOLN: 25.0000 ug | INTRAMUSCULAR | Status: DC | PRN

## 2019-03-04 MED ORDER — DEXMEDETOMIDINE HCL 200 MCG/2ML IV SOLN
INTRAVENOUS | Status: DC | PRN
  Administered 2019-03-04: 12 ug via INTRAVENOUS

## 2019-03-04 MED ORDER — HEPARIN SOD (PORK) LOCK FLUSH 100 UNIT/ML IV SOLN
250.0000 [IU] | Freq: Every day | INTRAVENOUS | Status: DC
  Administered 2019-03-04: 250 [IU]

## 2019-03-04 MED ORDER — HYDROCODONE-ACETAMINOPHEN 5-325 MG PO TABS: 1.0000 | ORAL_TABLET | Freq: Four times a day (QID) | ORAL | 0 refills | Status: DC | PRN

## 2019-03-04 MED ORDER — CEFAZOLIN SODIUM-DEXTROSE 2-3 GM-%(50ML) IV SOLR
INTRAVENOUS | Status: DC | PRN
  Administered 2019-03-04: 1 g via INTRAVENOUS

## 2019-03-04 MED ORDER — LACTATED RINGERS IV SOLN: INTRAVENOUS | Status: DC | PRN

## 2019-03-04 MED ORDER — PROPOFOL 10 MG/ML IV BOLUS
INTRAVENOUS | Status: DC | PRN
  Administered 2019-03-04 (×3): 20 mg via INTRAVENOUS

## 2019-03-04 MED ORDER — CEFAZOLIN SODIUM-DEXTROSE 1-4 GM/50ML-% IV SOLN: 1.0000 g | INTRAVENOUS | Status: DC

## 2019-03-04 MED ORDER — HEPARIN SOD (PORK) LOCK FLUSH 100 UNIT/ML IV SOLN: 250.0000 [IU] | INTRAVENOUS | Status: DC | PRN

## 2019-03-04 MED ORDER — MIDAZOLAM HCL 5 MG/5ML IJ SOLN
INTRAMUSCULAR | Status: DC | PRN
  Administered 2019-03-04 (×2): 1 mg via INTRAVENOUS

## 2019-03-04 MED ORDER — SODIUM CHLORIDE 0.9 % IV SOLN: INTRAVENOUS | Status: DC

## 2019-03-04 SURGICAL SUPPLY — 11 items
BAG DRAIN CYSTO-URO LG1000N (MISCELLANEOUS) ×2 IMPLANT
CATH ROBINSON RED A/P 16FR (CATHETERS) ×2 IMPLANT
DRAPE UNDER BUTTOCK W/FLU (DRAPES) IMPLANT
GLOVE BIOGEL M STRL SZ7.5 (GLOVE) ×2 IMPLANT
GOWN STRL REUS W/TWL XL LVL3 (GOWN DISPOSABLE) ×2 IMPLANT
NEEDLE HYPO 25X1 1.5 SAFETY (NEEDLE) IMPLANT
PACK CYSTO AR (MISCELLANEOUS) ×2 IMPLANT
SET CYSTO W/LG BORE CLAMP LF (SET/KITS/TRAYS/PACK) ×2 IMPLANT
SURGILUBE 2OZ TUBE FLIPTOP (MISCELLANEOUS) ×2 IMPLANT
SYR 10ML LL (SYRINGE) ×2 IMPLANT
TUBING CONNECTING 10 (TUBING) ×2 IMPLANT

## 2019-03-04 NOTE — Transfer of Care (Signed)
Immediate Anesthesia Transfer of Care Note  Patient: Amber Christensen  Procedure(s) Performed: CYSTOSCOPY/HYDRODISTENSION (N/A Bladder)  Patient Location: PACU  Anesthesia Type:General  Level of Consciousness: awake, alert  and oriented  Airway & Oxygen Therapy: Patient Spontanous Breathing  Post-op Assessment: Report given to RN and Post -op Vital signs reviewed and stable  Post vital signs: Reviewed and stable  Last Vitals:  Vitals Value Taken Time  BP 129/78 03/04/19 1418  Temp 36.3 C 03/04/19 1418  Pulse 102 03/04/19 1420  Resp 14 03/04/19 1420  SpO2 96 % 03/04/19 1420    Last Pain:  Vitals:   03/04/19 1219  TempSrc: Oral  PainSc: 0-No pain         Complications: No apparent anesthesia complications

## 2019-03-04 NOTE — Anesthesia Preprocedure Evaluation (Signed)
Anesthesia Evaluation  Patient identified by MRN, date of birth, ID band Patient awake    Reviewed: Allergy & Precautions, H&P , NPO status , Patient's Chart, lab work & pertinent test results, reviewed documented beta blocker date and time   History of Anesthesia Complications (+) PONV and history of anesthetic complications  Airway Mallampati: II   Neck ROM: full    Dental  (+) Poor Dentition   Pulmonary shortness of breath and with exertion,    Pulmonary exam normal        Cardiovascular Exercise Tolerance: Good hypertension, On Medications + angina with exertion Normal cardiovascular exam Rhythm:regular Rate:Normal     Neuro/Psych  Headaches, Anxiety Depression Bipolar Disorder  Neuromuscular disease CVA, No Residual Symptoms negative psych ROS   GI/Hepatic GERD  Medicated,(+) Hepatitis -  Endo/Other  negative endocrine ROSdiabetes, Well Controlled, Type 1, Insulin Dependent  Renal/GU Renal disease  negative genitourinary   Musculoskeletal   Abdominal   Peds  Hematology  (+) Blood dyscrasia, anemia ,   Anesthesia Other Findings Past Medical History: No date: Allergy     Comment:  many many allergies No date: Anemia     Comment:  in past 01/2018: Anginal pain ([place])     Comment:  cardiac workup clear No date: Anxiety No date: 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 mesh removed 1999: Blood transfusion without reported diagnosis     Comment:  received 20 units of blood after tearing esophagus from               vomitting so severely after ERCP No date: Breast mass     Comment:  left side biopsy was negative No date: Carpal tunnel syndrome     Comment:  left hand 2002: Cervical cancer ([place]) No date: Chronic kidney disease     Comment:  cyst on left kidney No date: Chronic  pancreatitis ([place])     Comment:  prior to pancreas surgery No date: Cirrhosis ([place]) No date: Clotting disorder ([place])     Comment:  pt states that sometimes she has difficulty stopping to               bleed and other times it is okay No date: Depression No date: Diabetes mellitus without complication (H[facility])     Comment:  has insulin pump No date: Dyspnea No date: Esophagitis No date: GERD (gastroesophageal reflux disease) No date: Headache     Comment:  migraines...takes compazine for this No date: Heart disease 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 left kidney mass. No date: Liver disease     Comment:  NASH. has had liver biopsies. negative except for NASH No date: NASH (nonalcoholic steatohepatitis) No date: NASH (nonalcoholic steatohepatitis) No date: Pancreatitis No date: PONV (postoperative nausea and vomiting) 11/2017: Stroke ([place])     Comment:  had tia. no longer needs plavix No date: Thyroid disease Past Surgical History: No date: ABDOMINAL HYSTERECTOMY No date: APPENDECTOMY No date: BLADDER SURGERY     Comment:  mesh removed. revision which has caused  everything to               fall again No date: BOWEL RESECTION     Comment:  took a piece of bowel out after pancreatectomy caused               problem with bowel.  1989: BREAST BIOPSY; Left     Comment:  benign [id number], 1998: CARDIAC CATHETERIZATION     Comment:  no stents, fatty streaks No date: CHOLECYSTECTOMY No date: COLON SURGERY 10/08/2018: COLONOSCOPY WITH PROPOFOL; N/A     Comment:  Procedure: COLONOSCOPY WITH PROPOFOL;  Surgeon:               Lollie Sails, MD;  Location: [facility] ENDOSCOPY;                Service: Endoscopy;  Laterality: N/A; 1992: CYST REMOVAL HAND; Left     Comment:  Ganglion  cyst removed from Left Wrist No date: CYSTECTOMY No date: DILATATION & CURETTAGE/HYSTEROSCOPY WITH MYOSURE No date: DILATION AND CURETTAGE OF UTERUS No date: ERCP No date: ESOPHAGOGASTRODUODENOSCOPY No date: HERNIA REPAIR 1992: KNEE ARTHROSCOPY; Left 02/06/2019: LEFT HEART CATH AND CORONARY ANGIOGRAPHY; Left     Comment:  Procedure: LEFT HEART CATH AND CORONARY ANGIOGRAPHY;                Surgeon: Yolonda Kida, MD;  Location: [facility];  Service: Cardiovascular;  Laterality: Left; No date: LIVER BIOPSY 2006: PANCREAS SURGERY     Comment:  partial pancreatectomy after endoscope knicked a part of              pancreas.  04/10/2017: PORTA CATH INSERTION; N/A     Comment:  Procedure: PORTA CATH INSERTION;  Surgeon: Algernon Huxley,              MD;  Location: [facility];  Service:               Cardiovascular;  Laterality: N/A; 02/21/2019: PORTA CATH INSERTION; N/A     Comment:  Procedure: PORTA CATH INSERTION;  Surgeon: Algernon Huxley,              MD;  Location: [facility];  Service:               Cardiovascular;  Laterality: N/A; 2012: REPAIR OF ESOPHAGUS     Comment:  3 clamps placed in esophagus 03/15/2018: ROBOTIC ASSISTED LAPAROSCOPIC VENTRAL/INCISIONAL HERNIA  REPAIR; N/A     Comment:  Procedure: ROBOTIC ASSISTED LAPAROSCOPIC VENTRAL HERNIA               REPAIR;  Surgeon: Jules Husbands, MD;  Location: [facility]               ORS;  Service: General;  Laterality: N/A; No date: SMALL BOWEL REPAIR No date: TONSILLECTOMY   Reproductive/Obstetrics negative OB ROS                             Anesthesia Physical Anesthesia Plan  ASA: IV  Anesthesia Plan: General   Post-op Pain Management:    Induction:   PONV Risk Score and Plan: 4 or greater  Airway Management Planned:   Additional Equipment:   Intra-op Plan:   Post-operative Plan:   Informed Consent: I have reviewed the patients History and Physical,  chart,  labs and discussed the procedure including the risks, benefits and alternatives for the proposed anesthesia with the patient or authorized representative who has indicated his/her understanding and acceptance.     Dental Advisory Given  Plan Discussed with: CRNA  Anesthesia Plan Comments:         Anesthesia Quick Evaluation

## 2019-03-04 NOTE — Progress Notes (Addendum)
The following instruction were given to the husband in the care of the patient upon her being discharged   [place]  The drugs that you were given will stay in your system until tomorrow so for the next 24 hours you should not:  A) Drive an automobile B) Make any legal decisions C) Drink any alcoholic beverage  You may resume regular meals tomorrow.  Today it is better to start with liquids and gradually work up to solid foods.  You may eat anything you prefer, but it is better to start with liquids, then soup and crackers, and gradually work up to solid foods.   Please notify your doctor immediately if you have any unusual bleeding, trouble breathing, redness and pain at the surgery site, drainage, fever, or pain not relieved by medication.  The husband verbalized understanding of the instruction.

## 2019-03-04 NOTE — Interval H&P Note (Signed)
History and Physical Interval Note:  03/04/2019 1:14 PM  Amber Christensen  has presented today for surgery, with the diagnosis of chronic cystitis.  The various methods of treatment have been discussed with the patient and family. After consideration of risks, benefits and other options for treatment, the patient has consented to  Procedure(s): CYSTOSCOPY/HYDRODISTENSION (N/A) as a surgical intervention.  The patient's history has been reviewed, patient examined, no change in status, stable for surgery.  I have reviewed the patient's chart and labs.  Questions were answered to the patient's satisfaction.     [name] A Eddis Pingleton

## 2019-03-04 NOTE — Discharge Instructions (Signed)
I have reviewed discharge instructions in detail with the patient. They will follow-up with me or their physician as scheduled. My nurse will also be calling the patients as per protocol.

## 2019-03-04 NOTE — Progress Notes (Signed)
CV and Resp exam normal

## 2019-03-04 NOTE — H&P (Signed)
HPI:  I was consulted to assist the patient's chronic left lower quadrant pain. She is gone to the emergency room with this. The pain may lessen some during voiding but she was nonspecific. She also states she gets vaginal burning when she voids. This is been worse in the last year. Cannot have intercourse due to pelvic pain  She can void every 30 minutes due to urgency and sometimes pressure. She cannot hold it for 2 hours. She gets up 5-6 times a night. Sometimes she has urge incontinence. She leaks with coughing sneezing but not bending lifting. She has moderately severe bedwetting. She wears 2 or 3 shields per day that are moderately wet  She reports a history of pelvic abscess. It appears she had a mesh sling or mesh surgery and has had mesh removed possibly from her bladder and or bowel injury.  She had a CT scan in October which showed mild dilation of the right renal pelvis and right upper ureter slightly larger than the left side that was also prominent. No blood in urine. No urine infections documented recently  I reviewed the medical record from this month from [facility]Saw DR Ward in [facility] who felt she interstitial cystitis and started behavioral treatments  She has a lot of medical issues with a history of bipolar cirrhosis poorly controlled diabetes insulin pump and perhaps a bleeding disorder  Modifying factors: There are no other modifying factors  Associated signs and symptoms: There are no other associated signs and symptoms  Aggravating and relieving factors: There are no other aggravating or relieving factors  Severity: Moderate  Duration: Persistent  PMH:      Past Medical History:  Diagnosis Date  . Allergy    many many allergies  . Anemia    in past  . Anginal pain ([place]) 01/2018   cardiac workup clear  . Anxiety   . 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[place])    pt unaware of this diagnosis  . Bipolar disorder ([place])   . Bladder mass    removed and had tbt. mesh  removed  . Blood transfusion without reported diagnosis 1999   received 20 units of blood after tearing esophagus from vomitting so severely after ERCP  . Breast mass    left side biopsy was negative  . Carpal tunnel syndrome    left hand  . Cervical cancer ([place]) 2002  . Chronic kidney disease    cyst on left kidney  . Chronic pancreatitis ([place])    prior to pancreas surgery  . Cirrhosis ([place])   . Clotting disorder ([place])    pt states that sometimes she has difficulty stopping to bleed and other times it is okay  . Depression   . Diabetes mellitus without complication (HCC)    has insulin pump  . Dyspnea   . Esophagitis   . GERD (gastroesophageal reflux disease)   . Headache    migraines...takes compazine for this  . Heart disease   . 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 Hypertension   . Irritable bowel syndrome   . Kidney mass    just watching. left kidney mass.  . Liver disease    NASH. has had liver biopsies. negative except for NASH  . NASH (nonalcoholic steatohepatitis)   . NASH (nonalcoholic steatohepatitis)   . Pancreatitis   . Stroke ([place]) 11/2017   had tia. no longer needs plavix  .  Thyroid disease    Surgical History:       Past Surgical History:  Procedure Laterality Date  . ABDOMINAL HYSTERECTOMY    . APPENDECTOMY    . BLADDER SURGERY     mesh removed. revision which has caused everything to fall again  . BOWEL RESECTION     took a piece of bowel out after pancreatectomy caused problem with bowel.   Marland Kitchen BREAST BIOPSY Left 1989   benign  . CARDIAC CATHETERIZATION  [id number], 1998   no stents, fatty streaks  . CHOLECYSTECTOMY    . COLON SURGERY    . COLONOSCOPY WITH PROPOFOL N/A 10/08/2018   Procedure: COLONOSCOPY WITH PROPOFOL; Surgeon: Lollie Sails, MD; Location: [facility] ENDOSCOPY; Service: Endoscopy; Laterality: N/A;  . CYST REMOVAL HAND Left 1992   Ganglion cyst removed from Left  Wrist  . CYSTECTOMY    . DILATATION & CURETTAGE/HYSTEROSCOPY WITH MYOSURE    . DILATION AND CURETTAGE OF UTERUS    . ERCP    . ESOPHAGOGASTRODUODENOSCOPY    . HERNIA REPAIR    . KNEE ARTHROSCOPY Left 1992  . LEFT HEART CATH AND CORONARY ANGIOGRAPHY Left 02/06/2019   Procedure: LEFT HEART CATH AND CORONARY ANGIOGRAPHY; Surgeon: Yolonda Kida, MD; Location: [place] CV LAB; Service: Cardiovascular; Laterality: Left;  . LIVER BIOPSY    . PANCREAS SURGERY  2006   partial pancreatectomy after endoscope knicked a part of pancreas.   Marland Kitchen PORTA CATH INSERTION N/A 04/10/2017   Procedure: PORTA CATH INSERTION; Surgeon: Algernon Huxley, MD; Location: [place] CV LAB; Service: Cardiovascular; Laterality: N/A;  . REPAIR OF ESOPHAGUS  2012   3 clamps placed in esophagus  . ROBOTIC ASSISTED LAPAROSCOPIC VENTRAL/INCISIONAL HERNIA REPAIR N/A 03/15/2018   Procedure: ROBOTIC ASSISTED LAPAROSCOPIC VENTRAL HERNIA REPAIR; Surgeon: Jules Husbands, MD; Location: [facility] ORS; Service: General; Laterality: N/A;  . SMALL BOWEL REPAIR    . TONSILLECTOMY     Home Medications:       Allergies as of 02/18/2019      Reactions   Ciprofloxacin    Rash, nausea vomitting   Demerol [meperidine] Hives, Other (See Comments)   Pt states that this medication causes cardiac arrest.    Dilaudid [hydromorphone Hcl] Nausea And Vomiting, Other (See Comments)   Pt states that this medication causes cardiac arrest.    Metoclopramide Hives   Breathing issues   Morphine And Related Anaphylaxis, Other (See Comments)   Pt states that this medication causes cardiac arrest   Morpholine Salicylate Nausea And Vomiting, Other (See Comments), Rash   CHF   Isosorbide Nitrate Other (See Comments)   Headache   Sulfa Antibiotics Nausea And Vomiting   Flagyl [metronidazole]    Rash, heart racing, nausea vomitting   Adhesive [tape] Rash   Blisters skin  Paper tape is okay   Darvon [propoxyphene] Nausea And Vomiting, Rash    Flexeril [cyclobenzaprine] Nausea And Vomiting, Rash   Levaquin [levofloxacin In D5w] Nausea And Vomiting, Rash   Naproxen Rash   Ibuprofen and advil are not a problem   Soma [carisoprodol] Nausea And Vomiting, Rash   Zofran [ondansetron Hcl] Nausea And Vomiting, Rash           Medication List       Accurate as of February 18, 2019 10:01 AM. If you have any questions, ask your nurse or doctor.        STOP taking these medications    Simethicone 20 MG/0.3ML Susp  Stopped by: Elayne Snare  Xoie Kreuser, MD       TAKE these medications    ACIDOPHILUS LACTOBACILLUS PO  Take 1 capsule by mouth daily.   amLODipine 5 MG tablet  Commonly known as: NORVASC  Take 5 mg by mouth daily.   CALCIUM 600/VITAMIN D3 PO  Take 1 tablet by mouth daily.   Creon 36000 UNITS Cpep capsule  Generic drug: lipase/protease/amylase  Take 144,000 Units by mouth 3 (three) times daily before meals.   cyanocobalamin 2000 MCG tablet  Take 2,000 mcg by mouth daily.   Dexlansoprazole 30 MG capsule  Take 30 mg by mouth daily.   dicyclomine 10 MG capsule  Commonly known as: BENTYL  Take 20 mg by mouth 4 (four) times daily as needed for spasms.   gabapentin 300 MG capsule  Commonly known as: NEURONTIN  Take 2 capsules (600 mg total) by mouth 3 (three) times daily.  What changed:  when to take this  reasons to take this  hydrochlorothiazide 12.5 MG tablet  Commonly known as: HYDRODIURIL  Take 1 tablet (12.5 mg total) by mouth daily as needed (for fluid).   lisinopril 40 MG tablet  Commonly known as: ZESTRIL  Take 40 mg by mouth daily.   metoprolol succinate 25 MG 24 hr tablet  Commonly known as: TOPROL-XL  Take 25 mg by mouth daily.   NovoLOG 100 UNIT/ML injection  Generic drug: insulin aspart  Inject 0-7 Units into the skin See admin instructions. Sliding scale   V-R MAGNESIUM 250 MG Tabs  Generic drug: Magnesium  Take 250 mg by mouth daily.   Vitamin D-3 125 MCG (5000 UT) Tabs  Take 5,000 Units by  mouth daily.   vitamin E 400 UNIT capsule  Take 400 Units by mouth daily.       Allergies:       Allergies  Allergen Reactions  . Ciprofloxacin     Rash, nausea vomitting  . Demerol [Meperidine] Hives and Other (See Comments)    Pt states that this medication causes cardiac arrest.   . Dilaudid [Hydromorphone Hcl] Nausea And Vomiting and Other (See Comments)    Pt states that this medication causes cardiac arrest.   . Metoclopramide Hives    Breathing issues  . Morphine And Related Anaphylaxis and Other (See Comments)    Pt states that this medication causes cardiac arrest  . Morpholine Salicylate Nausea And Vomiting, Other (See Comments) and Rash    CHF  . Isosorbide Nitrate Other (See Comments)    Headache   . Sulfa Antibiotics Nausea And Vomiting  . Flagyl [Metronidazole]     Rash, heart racing, nausea vomitting  . Adhesive [Tape] Rash    Blisters skin  Paper tape is okay  . Darvon [Propoxyphene] Nausea And Vomiting and Rash  . Flexeril [Cyclobenzaprine] Nausea And Vomiting and Rash  . Levaquin [Levofloxacin In D5w] Nausea And Vomiting and Rash  . Naproxen Rash    Ibuprofen and advil are not a problem  . Soma [Carisoprodol] Nausea And Vomiting and Rash  . Zofran [Ondansetron Hcl] Nausea And Vomiting and Rash   Family History:       Family History  Problem Relation Age of Onset  . Cirrhosis Mother   . Colon cancer Mother 40   TAH/BSO in ~15; deceased at 5  . Hypertension Mother   . Breast cancer Mother 32   unconfirmed  . Hypertension Father   . Prostate cancer Father 70   currently 67  . Other Father  liver disease  . Breast cancer Maternal Aunt    age at dx unk.; currently 37  . Cervical cancer Maternal Aunt   . Cancer Maternal Uncle    unk. type; currently 65s  . Stomach cancer Paternal Aunt 31   deceased at 44  . Breast cancer Maternal Grandmother 42   possibly bilateral in 74s; deceased 32  . Non-Hodgkin's lymphoma Daughter    unconfirmed;  currently 27  . Cancer Maternal Aunt    hematologic malignancy; deceased 54   Social History: reports that she has never smoked. She has never used smokeless tobacco. She reports current alcohol use. She reports that she does not use drugs.  ROS:  UROLOGY  Frequent Urination?: Yes  Hard to postpone urination?: Yes  Burning/pain with urination?: Yes  Get up at night to urinate?: Yes  Leakage of urine?: Yes  Urine stream starts and stops?: Yes  Trouble starting stream?: No  Do you have to strain to urinate?: No  Blood in urine?: No  Urinary tract infection?: Yes  Sexually transmitted disease?: No  Injury to kidneys or bladder?: Yes  Painful intercourse?: Yes  Weak stream?: Yes  Currently pregnant?: No  Vaginal bleeding?: No  Last menstrual period?: n  Gastrointestinal  Nausea?: No  Vomiting?: No  Indigestion/heartburn?: No  Diarrhea?: No  Constipation?: No  Constitutional  Fever: No  Night sweats?: Yes  Weight loss?: No  Fatigue?: Yes  Skin  Skin rash/lesions?: No  Itching?: No  Eyes  Blurred vision?: Yes  Double vision?: Yes  Ears/Nose/Throat  Sore throat?: No  Sinus problems?: No  Hematologic/Lymphatic  Swollen glands?: No  Easy bruising?: No  Cardiovascular  Leg swelling?: No  Chest pain?: No  Respiratory  Cough?: No  Shortness of breath?: No  Endocrine  Excessive thirst?: No  Musculoskeletal  Back pain?: No  Joint pain?: No  Neurological  Headaches?: No  Dizziness?: No  Psychologic  Depression?: No  Anxiety?: No  Physical Exam:  BP (!) 167/107  Pulse 85  Ht 5' 3"  (1.6 m)  Wt 157 lb (71.2 kg)  BMI 27.81 kg/m  Constitutional: Alert and oriented, No acute distress.  HEENT: [place] AT, moist mucus membranes. Trachea midline, no masses.  Cardiovascular: No clubbing, cyanosis, or edema.  Respiratory: Normal respiratory effort, no increased work of breathing.  GI: Abdomen is soft, nontender, nondistended, no abdominal masses  GU: No CVA tenderness.  Patient was difficult to examine and had a narrow introitus. She appeared to have good vaginal length and no prolapse. Anteriorly she was a bit tender but she was very tender at the level of the introitus. I felt a band to the right of the midline at the introitus on the right side and she may have had previous mesh posteriorly but again she was narrow and difficult to examine due to tenderness  Skin: No rashes, bruises or suspicious lesions.  Lymph: No cervical or inguinal adenopathy.  Neurologic: Grossly intact, no focal deficits, moving all 4 extremities.  Psychiatric: Normal mood and affect.  Laboratory Data:  Recent Labs                                               Recent [employer]  Recent Labs     Recent Labs                      Urinalysis  Labs (Brief)                                                                                                                Pertinent Imaging:  Assessment & Plan: Patient has chronic left lower abdominal pain and has had a CT scan within normal limits. She has vaginal burning and has not been given creams for her vaginitis. She is very tender. She has had previous bladder surgery and could have interstitial cystitis. She has mixed incontinence severe frequency and nocturia. She also has bedwetting. In the distant past she has had bladder medications but did not know the names.  I reviewed medical record. I reviewed her urinalysis. I sent the urine for culture. I thought it was reasonable to recommend a bladder hydrodistention. I could also examine her better under anesthesia for any mesh. Usual template discussed  She understands that many of her symptoms are chronic and may be difficult to address. Poorly controlled diabetes will make voiding dysfunction more difficult to treat  After a thorough review of the management options for the patient's condition the patient  elected to  proceed with surgical therapy as noted above. We have discussed the potential benefits and risks of the procedure, side effects of the proposed treatment, the likelihood of the patient achieving the goals of the procedure, and any potential problems that might occur during the procedure or recuperation. Informed consent has been obtained.

## 2019-03-04 NOTE — Op Note (Signed)
Preoperative diagnosis: Chronic cystitis Postoperative diagnosis: Chronic interstitial cystitis Surgery: Cystoscopy bladder hydrodistention and bladder instillation therapy Surgeon Dr. Nicki Reaper Maddi Collar  The patient has the above diagnosis and consented the above procedure.  Extra care was taken leg positioning.  21 French cystoscope was utilized.  Bladder close and trigone were normal.  No foreign body in bladder or urethra.  Bladder was hydrodistended for a few minutes at 600 mL.  Bladder was empty.  After hydrodistention exam was almost within normal limits but she had a few petechiae at 5 and 7:00 around the ureteral orifices.  No ulcers.  No injury.  Bladder was emptied  As a separate procedure 30 cc of 0.5% Marcaine was instilled in the bladder with a 14 French catheter  I did a careful vaginal examination and there is no mesh or rigidity of tissues in the vaginal vault.  The thickening on the right side the tissues were reasonably supple.  Patient will be treated for interstitial cystitis

## 2019-03-05 ENCOUNTER — Telehealth (INDEPENDENT_AMBULATORY_CARE_PROVIDER_SITE_OTHER): Payer: Self-pay

## 2019-03-05 NOTE — Telephone Encounter (Signed)
Patient called wanting to know how to get her port a cath flushed. I called same day surgery and a order was faxed over to [facility] for a port flush Q6 weeks with  Heparin saline flush. Patient was given this information. Patient did state that they were having trouble using her port during her surgery, I let her know that we have not received any information in regards to that.

## 2019-03-07 NOTE — Anesthesia Postprocedure Evaluation (Signed)
Anesthesia Post Note  Patient: Asiah Browder Postell  Procedure(s) Performed: CYSTOSCOPY/HYDRODISTENSION (N/A Bladder)  Patient location during evaluation: PACU Anesthesia Type: General Level of consciousness: awake and alert Pain management: pain level controlled Vital Signs Assessment: post-procedure vital signs reviewed and stable Respiratory status: spontaneous breathing, nonlabored ventilation, respiratory function stable and patient connected to nasal cannula oxygen Cardiovascular status: blood pressure returned to baseline and stable Postop Assessment: no apparent nausea or vomiting Anesthetic complications: no     Last Vitals:  Vitals:   03/04/19 1500 03/04/19 1530  BP:  140/86  Pulse: 77 77  Resp: 15 18  Temp:  36.7 C  SpO2: 100% 98%    Last Pain:  Vitals:   03/05/19 0907  TempSrc:   PainSc: 0-No pain                 Molli Barrows

## 2019-03-18 ENCOUNTER — Encounter: Payer: Self-pay | Admitting: Urology

## 2019-03-18 ENCOUNTER — Other Ambulatory Visit: Payer: Self-pay

## 2019-03-18 ENCOUNTER — Ambulatory Visit (INDEPENDENT_AMBULATORY_CARE_PROVIDER_SITE_OTHER): Payer: Medicare HMO | Admitting: Urology

## 2019-03-18 VITALS — BP 162/109 | HR 98 | Ht 63.0 in | Wt 155.0 lb

## 2019-03-18 DIAGNOSIS — N3020 Other chronic cystitis without hematuria: Secondary | ICD-10-CM

## 2019-03-18 MED ORDER — MIRABEGRON ER 50 MG PO TB24: 50.0000 mg | ORAL_TABLET | Freq: Every day | ORAL | 11 refills | Status: DC

## 2019-03-18 NOTE — Progress Notes (Signed)
03/18/2019 8:28 AM   Amber Christensen [birth year] [id number]  Referring provider: [name], MD [address] [facility] [place],  [place] [address]  Chief Complaint  Patient presents with  . Post-op Problem    HPI: I was consulted to assist the patient's chronic left lower quadrant pain. She is gone to the emergency room with this. The pain may lessen some during voiding but she was nonspecific. She also states she gets vaginal burning when she voids. This is been worse in the last year. Cannot have intercourse due to pelvic pain  She can void every 30 minutes due to urgency and sometimes pressure. She cannot hold it for 2 hours. She gets up 5-6 times a night. Sometimes she has urge incontinence. She leaks with coughing sneezing but not bending lifting. She has moderately severe bedwetting. She wears 2 or 3 shields per day that are moderately wet  She reports a history of pelvic abscess. It appears she had a mesh sling or mesh surgery and has had mesh removed possibly from her bladder and or bowel injury.  She had a CT scan in October which showed mild dilation of the right renal pelvis and right upper ureter slightly larger than the left side that was also prominent. No blood in urine. No urine infections documented recently   Today Patient had positive hydrodistention January 2020 but the findings were very mild and almost within normal limits with a few petechiae around each ureteral orifice.  She had no mesh issues involving the vagina.  The area in question in the vagina to the right side of the introitus posteriorly was reasonably noted her in the office to examine  Patient understands that I am not convinced she has interstitial cystitis though I gave her an IC diet.  The vaginal burning and redness she will continue to see her gynecologist and we talked about vaginitis.  She understands that there was no mesh issues.  The urgency incontinence and frequency is  bothering her the most as well.       PMH: Past Medical History:  Diagnosis Date  . Allergy    many many allergies  . Anemia    in past  . Anginal pain ([place]) 01/2018   cardiac workup clear  . Anxiety   . 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[place])    pt unaware of this diagnosis  . Bipolar disorder ([place])   . Bladder mass    removed and had tbt. mesh removed  . Blood transfusion without reported diagnosis 1999   received 20 units of blood after tearing esophagus from vomitting so severely after ERCP  . Breast mass    left side biopsy was negative  . Carpal tunnel syndrome    left hand  . Cervical cancer ([place]) 2002  . Chronic kidney disease    cyst on left kidney  . Chronic pancreatitis ([place])    prior to pancreas surgery  . Cirrhosis ([place])   . Clotting disorder ([place])    pt states that sometimes she has difficulty stopping to bleed and other times it is okay  . Depression   . Diabetes mellitus without complication (HCC)    has insulin pump  . Dyspnea   . Esophagitis   . GERD (gastroesophageal reflux disease)   . Headache    migraines...takes compazine for this  . Heart disease   . 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 Hypertension   . Irritable bowel syndrome   . Kidney mass    just watching. left kidney mass.  . Liver disease    NASH. has had liver biopsies. negative except for NASH  . NASH (nonalcoholic steatohepatitis)   . NASH (nonalcoholic steatohepatitis)   . Pancreatitis   . PONV (postoperative nausea and vomiting)   . Stroke ([place]) 11/2017   had tia. no longer needs plavix  . Thyroid disease     Surgical History: Past Surgical History:  Procedure Laterality Date  . ABDOMINAL HYSTERECTOMY    . APPENDECTOMY    . BLADDER SURGERY     mesh removed. revision which has caused everything to fall again  . BOWEL RESECTION     took a piece of bowel out after pancreatectomy caused problem with  bowel.   Marland Kitchen BREAST BIOPSY Left 1989   benign  . CARDIAC CATHETERIZATION  9038,[id number], 1998   no stents, fatty streaks  . CHOLECYSTECTOMY    . COLON SURGERY    . COLONOSCOPY WITH PROPOFOL N/A 10/08/2018   Procedure: COLONOSCOPY WITH PROPOFOL;  Surgeon: Lollie Sails, MD;  Location: [facility] ENDOSCOPY;  Service: Endoscopy;  Laterality: N/A;  . CYST REMOVAL HAND Left 1992   Ganglion cyst removed from Left Wrist  . CYSTECTOMY    . CYSTO WITH HYDRODISTENSION N/A 03/04/2019   Procedure: CYSTOSCOPY/HYDRODISTENSION;  Surgeon: Bjorn Loser, MD;  Location: [facility] ORS;  Service: Urology;  Laterality: N/A;  . DILATATION & CURETTAGE/HYSTEROSCOPY WITH MYOSURE    . DILATION AND CURETTAGE OF UTERUS    . ERCP    . ESOPHAGOGASTRODUODENOSCOPY    . HERNIA REPAIR    . KNEE ARTHROSCOPY Left 1992  . LEFT HEART CATH AND CORONARY ANGIOGRAPHY Left 02/06/2019   Procedure: LEFT HEART CATH AND CORONARY ANGIOGRAPHY;  Surgeon: Yolonda Kida, MD;  Location: [place] CV LAB;  Service: Cardiovascular;  Laterality: Left;  . LIVER BIOPSY    . PANCREAS SURGERY  2006   partial pancreatectomy after endoscope knicked a part of pancreas.   Marland Kitchen PORTA CATH INSERTION N/A 04/10/2017   Procedure: PORTA CATH INSERTION;  Surgeon: Amber Huxley, MD;  Location: [place] CV LAB;  Service: Cardiovascular;  Laterality: N/A;  . PORTA CATH INSERTION N/A 02/21/2019   Procedure: PORTA CATH INSERTION;  Surgeon: Amber Huxley, MD;  Location: [place] CV LAB;  Service: Cardiovascular;  Laterality: N/A;  . REPAIR OF ESOPHAGUS  2012   3 clamps placed in esophagus  . ROBOTIC ASSISTED LAPAROSCOPIC VENTRAL/INCISIONAL HERNIA REPAIR N/A 03/15/2018   Procedure: ROBOTIC ASSISTED LAPAROSCOPIC VENTRAL HERNIA REPAIR;  Surgeon: Jules Husbands, MD;  Location: [facility] ORS;  Service: General;  Laterality: N/A;  . SMALL BOWEL REPAIR    . TONSILLECTOMY      Home Medications:  Allergies as of 03/18/2019      Reactions   Ciprofloxacin    Rash,  nausea vomitting   Demerol [meperidine] Hives, Other (See Comments)   Pt states that this medication causes cardiac arrest.     Dilaudid [hydromorphone Hcl] Nausea And Vomiting, Other (See Comments)   Pt states that this medication causes cardiac arrest.     Metoclopramide Hives   Breathing issues   Morphine And Related Anaphylaxis, Other (See Comments)   Pt states that this medication causes cardiac arrest   Morpholine Salicylate Nausea And Vomiting, Other (See Comments), Rash   CHF   Isosorbide Nitrate Other (See Comments)   Headache  Sulfa Antibiotics Nausea And Vomiting   Flagyl [metronidazole]    Rash, heart racing, nausea vomitting   Adhesive [tape] Rash   Blisters skin Paper tape is okay   Darvon [propoxyphene] Nausea And Vomiting, Rash   Flexeril [cyclobenzaprine] Nausea And Vomiting, Rash   Levaquin [levofloxacin In D5w] Nausea And Vomiting, Rash   Naproxen Rash   Ibuprofen and advil are not a problem   Soma [carisoprodol] Nausea And Vomiting, Rash   Zofran [ondansetron Hcl] Nausea And Vomiting, Rash      Medication List       Accurate as of March 18, 2019  8:28 AM. If you have any questions, ask your nurse or doctor.        ACIDOPHILUS LACTOBACILLUS PO Take 1 capsule by mouth daily.   amLODipine 5 MG tablet Commonly known as: NORVASC Take 5 mg by mouth daily.   CALCIUM 600/VITAMIN D3 PO Take 1 tablet by mouth daily.   Creon 36000 UNITS Cpep capsule Generic drug: lipase/protease/amylase Take 144,000 Units by mouth 3 (three) times daily before meals.   cyanocobalamin 2000 MCG tablet Take 2,000 mcg by mouth daily.   Dexlansoprazole 30 MG capsule Take 30 mg by mouth daily.   dicyclomine 10 MG capsule Commonly known as: BENTYL Take 20 mg by mouth 4 (four) times daily as needed for spasms (abdominal spasms.).   gabapentin 300 MG capsule Commonly known as: NEURONTIN Take 2 capsules (600 mg total) by mouth 3 (three) times daily.    hydrochlorothiazide 12.5 MG tablet Commonly known as: HYDRODIURIL Take 1 tablet (12.5 mg total) by mouth daily as needed (for fluid).   HYDROcodone-acetaminophen 5-325 MG tablet Commonly known as: Norco Take 1-2 tablets by mouth every 6 (six) hours as needed for moderate pain.   insulin pump Soln Inject into the skin. NOVOLOG 100 UNIT/ML injection   lisinopril 40 MG tablet Commonly known as: ZESTRIL Take 40 mg by mouth daily.   metoprolol succinate 25 MG 24 hr tablet Commonly known as: TOPROL-XL Take 25 mg by mouth daily.   NovoLOG 100 UNIT/ML injection Generic drug: insulin aspart Inject 0-7 Units into the skin See admin instructions. Uses with insulin pump   nystatin cream Commonly known as: MYCOSTATIN Apply 1 application topically 2 (two) times daily as needed (lichen planus).   V-R MAGNESIUM 250 MG Tabs Generic drug: Magnesium Take 250 mg by mouth daily.   Vitamin D-3 125 MCG (5000 UT) Tabs Take 5,000 Units by mouth daily.   vitamin E 180 MG (400 UNITS) capsule Take 400 Units by mouth daily.       Allergies:  Allergies  Allergen Reactions  . Ciprofloxacin     Rash, nausea vomitting  . Demerol [Meperidine] Hives and Other (See Comments)    Pt states that this medication causes cardiac arrest.    . Dilaudid [Hydromorphone Hcl] Nausea And Vomiting and Other (See Comments)    Pt states that this medication causes cardiac arrest.    . Metoclopramide Hives    Breathing issues  . Morphine And Related Anaphylaxis and Other (See Comments)    Pt states that this medication causes cardiac arrest  . Morpholine Salicylate Nausea And Vomiting, Other (See Comments) and Rash    CHF  . Isosorbide Nitrate Other (See Comments)    Headache   . Sulfa Antibiotics Nausea And Vomiting  . Flagyl [Metronidazole]     Rash, heart racing, nausea vomitting  . Adhesive [Tape] Rash    Blisters skin Paper tape is okay  .  Darvon [Propoxyphene] Nausea And Vomiting and Rash  .  Flexeril [Cyclobenzaprine] Nausea And Vomiting and Rash  . Levaquin [Levofloxacin In D5w] Nausea And Vomiting and Rash  . Naproxen Rash    Ibuprofen and advil are not a problem  . Soma [Carisoprodol] Nausea And Vomiting and Rash  . Zofran [Ondansetron Hcl] Nausea And Vomiting and Rash    Family History: Family History  Problem Relation Age of Onset  . Cirrhosis Mother   . Colon cancer Mother 81       TAH/BSO in ~40; deceased at 17  . Hypertension Mother   . Breast cancer Mother 48       unconfirmed  . Hypertension Father   . Prostate cancer Father 6       currently 73  . Other Father        liver disease  . Breast cancer Maternal Aunt        age at dx unk.; currently 107  . Cervical cancer Maternal Aunt   . Cancer Maternal Uncle        unk. type; currently 14s  . Stomach cancer Paternal Aunt 61       deceased at 68  . Breast cancer Maternal Grandmother 4       possibly bilateral in 68s; deceased 26  . Non-Hodgkin's lymphoma Daughter        unconfirmed; currently 21  . Cancer Maternal Aunt        hematologic malignancy; deceased 16    Social History:  reports that she has never smoked. She has never used smokeless tobacco. She reports current alcohol use. She reports that she does not use drugs.  ROS: UROLOGY Frequent Urination?: Yes Hard to postpone urination?: Yes Burning/pain with urination?: Yes Get up at night to urinate?: Yes Leakage of urine?: Yes Urine stream starts and stops?: Yes Trouble starting stream?: No Do you have to strain to urinate?: No Blood in urine?: No Urinary tract infection?: Yes Sexually transmitted disease?: No Injury to kidneys or bladder?: No Painful intercourse?: No Weak stream?: No Currently pregnant?: No Vaginal bleeding?: No Last menstrual period?: n  Gastrointestinal Nausea?: No Vomiting?: No Indigestion/heartburn?: No Diarrhea?: No Constipation?: No  Constitutional Fever: No Night sweats?: No Weight loss?: No  Fatigue?: No  Skin Skin rash/lesions?: No Itching?: No  Eyes Blurred vision?: No Double vision?: No  Ears/Nose/Throat Sore throat?: No Sinus problems?: No  Hematologic/Lymphatic Swollen glands?: No Easy bruising?: No  Cardiovascular Leg swelling?: No Chest pain?: No  Respiratory Cough?: No Shortness of breath?: No  Endocrine Excessive thirst?: No  Musculoskeletal Back pain?: No Joint pain?: No  Neurological Headaches?: No Dizziness?: No  Psychologic Depression?: No Anxiety?: No  Physical Exam: BP (!) 162/109   Pulse 98   Ht 5' 3"  (1.6 m)   Wt 155 lb (70.3 kg)   BMI 27.46 kg/m     Laboratory Data: Lab Results  Component Value Date   WBC 13.1 (H) 12/08/2018   HGB 13.3 12/08/2018   HCT 37.0 12/08/2018   MCV 81.1 12/08/2018   PLT 163 12/08/2018    Lab Results  Component Value Date   CREATININE 0.91 12/08/2018    No results found for: PSA  No results found for: TESTOSTERONE  Lab Results  Component Value Date   HGBA1C 9.4 (H) 09/23/2018    Urinalysis    Component Value Date/Time   COLORURINE STRAW (A) 12/08/2018 0002   APPEARANCEUR Clear 02/18/2019 0910   LABSPEC 1.015 12/08/2018 0002  LABSPEC 1.018 01/31/2014 1455   PHURINE 5.0 12/08/2018 0002   GLUCOSEU 3+ (A) 02/18/2019 0910   GLUCOSEU >=500 01/31/2014 1455   HGBUR NEGATIVE 12/08/2018 0002   BILIRUBINUR Negative 02/18/2019 0910   BILIRUBINUR Negative 01/31/2014 1455   KETONESUR NEGATIVE 12/08/2018 0002   PROTEINUR Negative 02/18/2019 0910   PROTEINUR NEGATIVE 12/08/2018 0002   NITRITE Negative 02/18/2019 0910   NITRITE NEGATIVE 12/08/2018 0002   LEUKOCYTESUR Negative 02/18/2019 0910   LEUKOCYTESUR NEGATIVE 12/08/2018 0002   LEUKOCYTESUR Negative 01/31/2014 1455    Pertinent Imaging:   Assessment & Plan: I will start the patient on Myrbetriq 50 mg samples and prescription with interstitial cystitis diet.  I did not bring up [place] at this stage.  I did not bring up  installations.  We talked with the IC diet  Unfortunately her daughter continues to have difficulty with her health in [place]  There are no diagnoses linked to this encounter.  No follow-ups on file.  Reece Packer, MD  [facility] [address] [place], [place] [address] [phone]

## 2019-04-09 DIAGNOSIS — M310 Hypersensitivity angiitis: Secondary | ICD-10-CM | POA: Diagnosis not present

## 2019-04-09 DIAGNOSIS — R21 Rash and other nonspecific skin eruption: Secondary | ICD-10-CM | POA: Diagnosis not present

## 2019-04-15 ENCOUNTER — Ambulatory Visit
Admission: RE | Admit: 2019-04-15 | Discharge: 2019-04-15 | Disposition: A | Discharge status: Home / Self Care | Payer: Medicare HMO | Source: Ambulatory Visit | Attending: Vascular Surgery | Admitting: Vascular Surgery

## 2019-04-15 ENCOUNTER — Other Ambulatory Visit: Payer: Self-pay

## 2019-04-15 DIAGNOSIS — Z452 Encounter for adjustment and management of vascular access device: Secondary | ICD-10-CM | POA: Diagnosis not present

## 2019-04-15 DIAGNOSIS — Z794 Long term (current) use of insulin: Secondary | ICD-10-CM | POA: Diagnosis not present

## 2019-04-15 DIAGNOSIS — I10 Essential (primary) hypertension: Secondary | ICD-10-CM | POA: Diagnosis not present

## 2019-04-15 DIAGNOSIS — R109 Unspecified abdominal pain: Secondary | ICD-10-CM | POA: Diagnosis not present

## 2019-04-15 DIAGNOSIS — M310 Hypersensitivity angiitis: Secondary | ICD-10-CM | POA: Diagnosis not present

## 2019-04-15 DIAGNOSIS — R1012 Left upper quadrant pain: Secondary | ICD-10-CM | POA: Diagnosis not present

## 2019-04-15 DIAGNOSIS — N343 Urethral syndrome, unspecified: Secondary | ICD-10-CM | POA: Diagnosis not present

## 2019-04-15 DIAGNOSIS — K219 Gastro-esophageal reflux disease without esophagitis: Secondary | ICD-10-CM | POA: Diagnosis not present

## 2019-04-15 DIAGNOSIS — Z9641 Presence of insulin pump (external) (internal): Secondary | ICD-10-CM | POA: Diagnosis not present

## 2019-04-15 DIAGNOSIS — E1365 Other specified diabetes mellitus with hyperglycemia: Secondary | ICD-10-CM | POA: Diagnosis not present

## 2019-04-15 DIAGNOSIS — E1340 Other specified diabetes mellitus with diabetic neuropathy, unspecified: Secondary | ICD-10-CM | POA: Diagnosis not present

## 2019-04-15 DIAGNOSIS — E139 Other specified diabetes mellitus without complications: Secondary | ICD-10-CM | POA: Diagnosis not present

## 2019-04-15 MED ORDER — HEPARIN SOD (PORK) LOCK FLUSH 100 UNIT/ML IV SOLN
INTRAVENOUS | Status: AC
  Administered 2019-04-15: 500 [IU] via INTRAVENOUS
  Filled 2019-04-15: qty 5

## 2019-04-15 MED ORDER — HEPARIN SOD (PORK) LOCK FLUSH 100 UNIT/ML IV SOLN: 500.0000 [IU] | Freq: Once | INTRAVENOUS | Status: AC

## 2019-04-15 MED ORDER — SODIUM CHLORIDE 0.9% FLUSH
10.0000 mL | Freq: Once | INTRAVENOUS | Status: AC
  Administered 2019-04-15: 10 mL via INTRAVENOUS

## 2019-04-16 DIAGNOSIS — E139 Other specified diabetes mellitus without complications: Secondary | ICD-10-CM | POA: Diagnosis not present

## 2019-04-16 DIAGNOSIS — Z9641 Presence of insulin pump (external) (internal): Secondary | ICD-10-CM | POA: Diagnosis not present

## 2019-04-16 DIAGNOSIS — E1065 Type 1 diabetes mellitus with hyperglycemia: Secondary | ICD-10-CM | POA: Diagnosis not present

## 2019-04-16 DIAGNOSIS — E109 Type 1 diabetes mellitus without complications: Secondary | ICD-10-CM | POA: Diagnosis not present

## 2019-04-19 ENCOUNTER — Other Ambulatory Visit: Payer: Self-pay

## 2019-04-19 ENCOUNTER — Encounter: Payer: Self-pay | Admitting: Emergency Medicine

## 2019-04-19 ENCOUNTER — Emergency Department
Admission: EM | Admit: 2019-04-19 | Discharge: 2019-04-19 | Disposition: A | Discharge status: Home / Self Care | Payer: Medicare HMO | Attending: Emergency Medicine | Admitting: Emergency Medicine

## 2019-04-19 DIAGNOSIS — N189 Chronic kidney disease, unspecified: Secondary | ICD-10-CM | POA: Diagnosis not present

## 2019-04-19 DIAGNOSIS — Z8541 Personal history of malignant neoplasm of cervix uteri: Secondary | ICD-10-CM | POA: Insufficient documentation

## 2019-04-19 DIAGNOSIS — I129 Hypertensive chronic kidney disease with stage 1 through stage 4 chronic kidney disease, or unspecified chronic kidney disease: Secondary | ICD-10-CM | POA: Insufficient documentation

## 2019-04-19 DIAGNOSIS — Z9049 Acquired absence of other specified parts of digestive tract: Secondary | ICD-10-CM | POA: Diagnosis not present

## 2019-04-19 DIAGNOSIS — R1012 Left upper quadrant pain: Secondary | ICD-10-CM

## 2019-04-19 DIAGNOSIS — Z794 Long term (current) use of insulin: Secondary | ICD-10-CM | POA: Insufficient documentation

## 2019-04-19 DIAGNOSIS — Z79899 Other long term (current) drug therapy: Secondary | ICD-10-CM | POA: Insufficient documentation

## 2019-04-19 DIAGNOSIS — E1122 Type 2 diabetes mellitus with diabetic chronic kidney disease: Secondary | ICD-10-CM | POA: Diagnosis not present

## 2019-04-19 DIAGNOSIS — Z8673 Personal history of transient ischemic attack (TIA), and cerebral infarction without residual deficits: Secondary | ICD-10-CM | POA: Insufficient documentation

## 2019-04-19 LAB — URINALYSIS, COMPLETE (UACMP) WITH MICROSCOPIC
Bilirubin Urine: NEGATIVE
Glucose, UA: >=500 mg/dL — AB
Hgb urine dipstick: NEGATIVE
Ketones, ur: NEGATIVE mg/dL
Nitrite: NEGATIVE
Protein, ur: NEGATIVE mg/dL
Specific Gravity, Urine: 1.002 — ABNORMAL LOW (ref 1.005–1.030)
pH: 5 (ref 5.0–8.0)

## 2019-04-19 LAB — COMPREHENSIVE METABOLIC PANEL
ALT: 44 U/L (ref 0–44)
AST: 29 U/L (ref 15–41)
Albumin: 4 g/dL (ref 3.5–5.0)
Alkaline Phosphatase: 142 U/L — ABNORMAL HIGH (ref 38–126)
Anion gap: 13 (ref 5–15)
BUN: 14 mg/dL (ref 6–20)
CO2: 22 mmol/L (ref 22–32)
Calcium: 7.9 mg/dL — ABNORMAL LOW (ref 8.9–10.3)
Chloride: 100 mmol/L (ref 98–111)
Creatinine, Ser: 0.8 mg/dL (ref 0.44–1.00)
GFR calc Af Amer: >60 mL/min (ref >60)
GFR calc non Af Amer: >60 mL/min (ref >60)
Glucose, Bld: 275 mg/dL — ABNORMAL HIGH (ref 70–99)
Potassium: 3.6 mmol/L (ref 3.5–5.1)
Sodium: 135 mmol/L (ref 135–145)
Total Bilirubin: 3.1 mg/dL — ABNORMAL HIGH (ref 0.3–1.2)
Total Protein: 6.9 g/dL (ref 6.5–8.1)

## 2019-04-19 LAB — CBC
HCT: 39.7 % (ref 36.0–46.0)
Hemoglobin: 13.8 g/dL (ref 12.0–15.0)
MCH: 29.4 pg (ref 26.0–34.0)
MCHC: 34.8 g/dL (ref 30.0–36.0)
MCV: 84.5 fL (ref 80.0–100.0)
Platelets: 193 10*3/uL (ref 150–400)
RBC: 4.7 MIL/uL (ref 3.87–5.11)
RDW: 12 % (ref 11.5–15.5)
WBC: 10.8 10*3/uL — ABNORMAL HIGH (ref 4.0–10.5)
nRBC: 0 % (ref 0.0–0.2)

## 2019-04-19 LAB — GLUCOSE, CAPILLARY: Glucose-Capillary: 255 mg/dL — ABNORMAL HIGH (ref 70–99)

## 2019-04-19 LAB — LIPASE, BLOOD: Lipase: 17 U/L (ref 11–51)

## 2019-04-19 MED ORDER — AMOXICILLIN-POT CLAVULANATE 875-125 MG PO TABS: 1.0000 | ORAL_TABLET | Freq: Two times a day (BID) | ORAL | 0 refills | Status: AC

## 2019-04-19 MED ORDER — FENTANYL CITRATE (PF) 100 MCG/2ML IJ SOLN
50.0000 ug | Freq: Once | INTRAMUSCULAR | Status: AC
  Administered 2019-04-19: 50 ug via INTRAVENOUS
  Filled 2019-04-19: qty 2

## 2019-04-19 MED ORDER — HEPARIN SOD (PORK) LOCK FLUSH 100 UNIT/ML IV SOLN
500.0000 [IU] | Freq: Once | INTRAVENOUS | Status: AC
  Administered 2019-04-19: 500 [IU] via INTRAVENOUS

## 2019-04-19 MED ORDER — SODIUM CHLORIDE 0.9 % IV SOLN
1000.0000 mL | Freq: Once | INTRAVENOUS | Status: AC
  Administered 2019-04-19: 1000 mL via INTRAVENOUS

## 2019-04-19 MED ORDER — PROMETHAZINE HCL 25 MG/ML IJ SOLN
12.5000 mg | Freq: Once | INTRAMUSCULAR | Status: AC
  Administered 2019-04-19: 12.5 mg via INTRAVENOUS
  Filled 2019-04-19: qty 1

## 2019-04-19 NOTE — ED Notes (Signed)
Upon this RN's entrance to room, pt alert and resting calmly in bed.

## 2019-04-19 NOTE — ED Notes (Signed)
Pt up to bedside toilet attempting to provide urine sample. Currently steady while ambulating.

## 2019-04-19 NOTE — ED Notes (Addendum)
Pt reports BP is usually "all over the place" and that it "goes high" when she is "in pain". Denies having taken her BP meds today d/t nausea. Pt agrees to use call bell to notify this RN when she can provide urine sample. Call bell within reach. Rail up. Bed locked low.

## 2019-04-19 NOTE — ED Notes (Signed)
Pt assisted to the toilet by this RN. Urine sample provided. Pt returned back to bed and placed back on spO2 and BP monitor, NAD noted. Pt provided with warm blanked, denies further needs at this time.

## 2019-04-19 NOTE — ED Notes (Signed)
EDP verbal order/approval of heparin flush dose.

## 2019-04-19 NOTE — ED Triage Notes (Signed)
Here for LLQ/ left back/flank pain. No hx of kidney stones. Blood sugars elevated; pt is diabetic and on prednisone for vasculitis.  Pt has chronic elevated liver enzymes and bilirubin.  No fever, NVD.  VSS at this time.

## 2019-04-19 NOTE — ED Provider Notes (Signed)
[facility] Emergency Department Provider Note   ____________________________________________    I have reviewed the triage vital signs and the nursing notes.   HISTORY  Chief Complaint Abdominal Pain     HPI Amber Christensen is a 54 y.o. female with significant past medical history as detailed below who presents with complaints of left upper abdominal pain.  Patient reports this is been ongoing for nearly a week, she reports this is consistent with her chronic abdominal pain however pain had been more left lower quadrant, now it is left upper quadrant however the quality of the pain is the same.  She did see her PCP on Monday who prescribed her Cipro but apparently she developed nausea to that medication so it was discontinued.  She denies fevers or chills.  She is on prednisone for vasculitis which is cause increase in her sugars.  Has not taken any other medications for this.  Pain does not radiate.  Past Medical History:  Diagnosis Date  . Allergy    many many allergies  . Anemia    in past  . Anginal pain ([place]) 01/2018   cardiac workup clear  . Anxiety   . 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[place])    pt unaware of this diagnosis  . Bipolar disorder ([place])   . Bladder mass    removed and had tbt. mesh removed  . Blood transfusion without reported diagnosis 1999   received 20 units of blood after tearing esophagus from vomitting so severely after ERCP  . Breast mass    left side biopsy was negative  . Carpal tunnel syndrome    left hand  . Cervical cancer ([place]) 2002  . Chronic kidney disease    cyst on left kidney  . Chronic pancreatitis ([place])    prior to pancreas surgery  . Cirrhosis ([place])   . Clotting disorder ([place])    pt states that sometimes she has difficulty stopping to bleed and other times it is okay  . Depression   . Diabetes mellitus without complication (HCC)    has insulin pump  . Dyspnea   . Esophagitis   . GERD  (gastroesophageal reflux disease)   . Headache    migraines...takes compazine for this  . Heart disease   . Hem[facility])    doctors think this was due to plavix and having a dental procedure which bled alot  . Hypertension   . Irritable bowel syndrome   . Kidney mass    just watching. left kidney mass.  . Liver disease    NASH. has had liver biopsies. negative except for NASH  . NASH (nonalcoholic steatohepatitis)   . NASH (nonalcoholic steatohepatitis)   . Pancreatitis   . PONV (postoperative nausea and vomiting)   . Stroke ([place]) 11/2017   had tia. no longer needs plavix  . Thyroid disease     Patient Active Problem List   Diagnosis Date Noted  . Hyperglycemia 09/23/2018  . S/P ventral herniorrhaphy 03/15/2018  . Ventral hernia without obstruction or gangrene   . Hypertension 03/21/2017  . Diabetes mellitus without complication ([place]) 76/81/1572  . Pancreatitis 03/21/2017  . Poor venous access 03/21/2017  . Vasculitis ([place]) 08/03/2016  . Acute maculopapular rash 04/21/2016  . Maculopapular rash, generalized 04/20/2016  . Unstable angina ([place]) 09/28/2015    Past Surgical History:  Procedure Laterality Date  . ABDOMINAL HYSTERECTOMY    . APPENDECTOMY    . BLADDER SURGERY     mesh  removed. revision which has caused everything to fall again  . BOWEL RESECTION     took a piece of bowel out after pancreatectomy caused problem with bowel.   Marland Kitchen BREAST BIOPSY Left 1989   benign  . CARDIAC CATHETERIZATION  3016,0109, 1998   no stents, fatty streaks  . CHOLECYSTECTOMY    . COLON SURGERY    . COLONOSCOPY WITH PROPOFOL N/A 10/08/2018   Procedure: COLONOSCOPY WITH PROPOFOL;  Surgeon: Lollie Sails, MD;  Location: [facility] ENDOSCOPY;  Service: Endoscopy;  Laterality: N/A;  . CYST REMOVAL HAND Left 1992   Ganglion cyst removed from Left Wrist  . CYSTECTOMY    . CYSTO WITH HYDRODISTENSION N/A 03/04/2019   Procedure: CYSTOSCOPY/HYDRODISTENSION;  Surgeon: Bjorn Loser, MD;   Location: [facility] ORS;  Service: Urology;  Laterality: N/A;  . DILATATION & CURETTAGE/HYSTEROSCOPY WITH MYOSURE    . DILATION AND CURETTAGE OF UTERUS    . ERCP    . ESOPHAGOGASTRODUODENOSCOPY    . HERNIA REPAIR    . KNEE ARTHROSCOPY Left 1992  . LEFT HEART CATH AND CORONARY ANGIOGRAPHY Left 02/06/2019   Procedure: LEFT HEART CATH AND CORONARY ANGIOGRAPHY;  Surgeon: Yolonda Kida, MD;  Location: [place] [facility];  Service: Cardiovascular;  Laterality: Left;  . LIVER BIOPSY    . PANCREAS SURGERY  2006   partial pancreatectomy after endoscope knicked a part of pancreas.   Marland Kitchen PORTA CATH INSERTION N/A 04/10/2017   Procedure: PORTA CATH INSERTION;  Surgeon: Algernon Huxley, MD;  Location: [place] [facility];  Service: Cardiovascular;  Laterality: N/A;  . PORTA CATH INSERTION N/A 02/21/2019   Procedure: PORTA CATH INSERTION;  Surgeon: Algernon Huxley, MD;  Location: [place] [facility];  Service: Cardiovascular;  Laterality: N/A;  . REPAIR OF ESOPHAGUS  2012   3 clamps placed in esophagus  . ROBOTIC ASSISTED LAPAROSCOPIC VENTRAL/INCISIONAL HERNIA REPAIR N/A 03/15/2018   Procedure: ROBOTIC ASSISTED LAPAROSCOPIC VENTRAL HERNIA REPAIR;  Surgeon: Jules Husbands, MD;  Location: [facility] ORS;  Service: General;  Laterality: N/A;  . SMALL BOWEL REPAIR    . TONSILLECTOMY      Prior to Admission medications   Medication Sig Start Date End Date Taking? Authorizing Provider  ACIDOPHILUS LACTOBACILLUS PO Take 1 capsule by mouth daily.     [provider]  amLODipine (NORVASC) 5 MG tablet Take 5 mg by mouth daily.    [provider]  amoxicillin-clavulanate (AUGMENTIN) 875-125 MG tablet Take 1 tablet by mouth 2 (two) times daily for 7 days. 04/19/19 04/26/19  Lavonia Drafts, MD  Calcium Carb-Cholecalciferol (CALCIUM 600/VITAMIN D3 PO) Take 1 tablet by mouth daily.    [provider]  Cholecalciferol (VITAMIN D-3) 5000 units TABS Take 5,000 Units by mouth daily.    [provider]  cyanocobalamin 2000 MCG tablet Take 2,000 mcg by mouth daily.    [provider]  Dexlansoprazole 30 MG capsule Take 30 mg by mouth daily.    [provider]  dicyclomine (BENTYL) 10 MG capsule Take 20 mg by mouth 4 (four) times daily as needed for spasms (abdominal spasms.).  08/20/18 08/20/19  [provider]  gabapentin (NEURONTIN) 300 MG capsule Take 2 capsules (600 mg total) by mouth 3 (three) times daily. 03/16/18   Tylene Fantasia, PA-C  hydrochlorothiazide (HYDRODIURIL) 12.5 MG tablet Take 1 tablet (12.5 mg total) by mouth daily as needed (for fluid). 09/25/18   Henreitta Leber, MD  HYDROcodone-acetaminophen (NORCO) 5-325 MG tablet Take 1-2  tablets by mouth every 6 (six) hours as needed for moderate pain. 03/04/19   Bjorn Loser, MD  Insulin Human (INSULIN PUMP) SOLN Inject into the skin. NOVOLOG 100 UNIT/ML injection    [provider]  lipase/protease/amylase (CREON) 36000 UNITS CPEP capsule Take 144,000 Units by mouth 3 (three) times daily before meals.     [provider]  lisinopril (ZESTRIL) 40 MG tablet Take 40 mg by mouth daily. 09/03/18   [provider]  Magnesium (V-R MAGNESIUM) 250 MG TABS Take 250 mg by mouth daily.    [provider]  metoprolol succinate (TOPROL-XL) 25 MG 24 hr tablet Take 25 mg by mouth daily.    [provider]  mirabegron ER (MYRBETRIQ) 50 MG TB24 tablet Take 1 tablet (50 mg total) by mouth daily. 03/18/19   MacDiarmid, Nicki Reaper, MD  NOVOLOG 100 UNIT/ML injection Inject 0-7 Units into the skin See admin instructions. Uses with insulin pump    [provider]  nystatin cream (MYCOSTATIN) Apply 1 application topically 2 (two) times daily as needed (lichen planus).    [provider]  vitamin E 400 UNIT capsule Take 400 Units by mouth daily.    [provider]     Allergies Ciprofloxacin, Demerol [meperidine], Dilaudid [hydromorphone hcl], Metoclopramide,  Morphine and related, Morpholine salicylate, Isosorbide nitrate, Sulfa antibiotics, Flagyl [metronidazole], Adhesive [tape], Darvon [propoxyphene], Flexeril [cyclobenzaprine], Levaquin [levofloxacin in d5w], Naproxen, Soma [carisoprodol], and Zofran [ondansetron hcl]  Family History  Problem Relation Age of Onset  . Cirrhosis Mother   . Colon cancer Mother 35       TAH/BSO in ~1; deceased at 74  . Hypertension Mother   . Breast cancer Mother 80       unconfirmed  . Hypertension Father   . Prostate cancer Father 75       currently 53  . Other Father        liver disease  . Breast cancer Maternal Aunt        age at dx unk.; currently 84  . Cervical cancer Maternal Aunt   . Cancer Maternal Uncle        unk. type; currently 38s  . Stomach cancer Paternal Aunt 65       deceased at 58  . Breast cancer Maternal Grandmother 69       possibly bilateral in 72s; deceased 24  . Non-Hodgkin's lymphoma Daughter        unconfirmed; currently 78  . Cancer Maternal Aunt        hematologic malignancy; deceased 1    Social History Social History   Tobacco Use  . Smoking status: Never Smoker  . Smokeless tobacco: Never Used  Substance Use Topics  . Alcohol use: Yes    Comment: rarely  . Drug use: No    Review of Systems  Constitutional: No fever/chills Eyes: No visual changes.  ENT: No sore throat. Cardiovascular: Denies chest pain. Respiratory: Denies shortness of breath. Gastrointestinal: As above Genitourinary: Negative for dysuria.  Positive frequency Musculoskeletal: Negative for back pain. Skin: Negative for rash. Neurological: Negative for headaches   ____________________________________________   PHYSICAL EXAM:  VITAL SIGNS: ED Triage Vitals [04/19/19 1027]  Enc Vitals Group     BP (!) 142/79     Pulse Rate 94     Resp 18     Temp 98.8 F (37.1 C)     Temp Source Oral     SpO2 98 %     Weight 70.3  kg (155 lb)     Height 1.626 m (5' 4" )     Head  Circumference      Peak Flow      Pain Score 10     Pain Loc      Pain Edu?      Excl. in [place]?     Constitutional: Alert and oriented.   Nose: No congestion/rhinnorhea. Mouth/Throat: Mucous membranes are moist.   Neck:  Painless ROM Cardiovascular: Normal rate, regular rhythm. Grossly normal heart sounds.  Good peripheral circulation. Respiratory: Normal respiratory effort.  No retractions. Lungs CTAB. Gastrointestinal: Mild tenderness left upper quadrant.. No distention.  No CVA tenderness.  Musculoskeletal:   Warm and well perfused Neurologic:  Normal speech and language. No gross focal neurologic deficits are appreciated.  Skin:  Skin is warm, dry and intact. No rash noted. Psychiatric: Mood and affect are normal. Speech and behavior are normal.  ____________________________________________   LABS (all labs ordered are listed, but only abnormal results are displayed)  Labs Reviewed  CBC - Abnormal; Notable for the following components:      Result Value   WBC 10.8 (*)    All other components within normal limits  COMPREHENSIVE METABOLIC PANEL - Abnormal; Notable for the following components:   Glucose, Bld 275 (*)    Calcium 7.9 (*)    Alkaline Phosphatase 142 (*)    Total Bilirubin 3.1 (*)    All other components within normal limits  URINALYSIS, COMPLETE (UACMP) WITH MICROSCOPIC - Abnormal; Notable for the following components:   Color, Urine STRAW (*)    APPearance CLEAR (*)    Specific Gravity, Urine 1.002 (*)    Glucose, UA >=500 (*)    Leukocytes,Ua SMALL (*)    Bacteria, UA RARE (*)    All other components within normal limits  GLUCOSE, CAPILLARY - Abnormal; Notable for the following components:   Glucose-Capillary 255 (*)    All other components within normal limits  LIPASE, BLOOD    ____________________________________________  EKG  None________________________________________  RADIOLOGY  None ____________________________________________   PROCEDURES  Procedure(s) performed: No  Procedures   Critical Care performed: No ____________________________________________   INITIAL IMPRESSION / ASSESSMENT AND PLAN / ED COURSE  Pertinent labs & imaging results that were available during my care of the patient were reviewed by me and considered in my medical decision making (see chart for details).  Patient presents with abdominal pain which appears consistent with her chronic abdominal pain for which she has had extensive work-up.  Most recently had cystoscopy, hydrodistention which demonstrated overall normal bladder.  Had CT scan in October which was overall unremarkable.  Differential includes colitis, chronic abdominal pain, diverticulitis.  She has extensive allergies.  She has a port because of difficult vein access.  We will give IV fluids, will try IV fentanyl and IV Phenergan for pain control.  Pending labs, will consider imaging as needed.  Patient is feeling better after IV fluids, IV analgesics.  Lab work is overall unremarkable.  Urinalysis is reassuring.  Suspect colitis versus diverticulitis, will start the patient on Augmentin which she is not allergic to.  She will follow-up with PCP.  Return precautions discussed   ____________________________________________   FINAL CLINICAL IMPRESSION(S) / ED DIAGNOSES  Final diagnoses:  Left upper quadrant abdominal pain        Note:  This document was prepared using Dragon voice recognition software and may include unintentional dictation errors.   Lavonia Drafts, MD 04/19/19 1323

## 2019-04-19 NOTE — ED Notes (Signed)
Port at R chest de-accessed with heparin per policy.

## 2019-04-23 DIAGNOSIS — R21 Rash and other nonspecific skin eruption: Secondary | ICD-10-CM | POA: Diagnosis not present

## 2019-04-23 DIAGNOSIS — R110 Nausea: Secondary | ICD-10-CM | POA: Diagnosis not present

## 2019-04-23 DIAGNOSIS — R1012 Left upper quadrant pain: Secondary | ICD-10-CM | POA: Diagnosis not present

## 2019-04-23 DIAGNOSIS — K7581 Nonalcoholic steatohepatitis (NASH): Secondary | ICD-10-CM | POA: Diagnosis not present

## 2019-04-30 DIAGNOSIS — M19041 Primary osteoarthritis, right hand: Secondary | ICD-10-CM | POA: Diagnosis not present

## 2019-04-30 DIAGNOSIS — L03011 Cellulitis of right finger: Secondary | ICD-10-CM | POA: Diagnosis not present

## 2019-04-30 DIAGNOSIS — M7989 Other specified soft tissue disorders: Secondary | ICD-10-CM | POA: Diagnosis not present

## 2019-05-02 DIAGNOSIS — I776 Arteritis, unspecified: Secondary | ICD-10-CM | POA: Diagnosis not present

## 2019-05-02 DIAGNOSIS — I10 Essential (primary) hypertension: Secondary | ICD-10-CM | POA: Diagnosis not present

## 2019-05-02 DIAGNOSIS — E1040 Type 1 diabetes mellitus with diabetic neuropathy, unspecified: Secondary | ICD-10-CM | POA: Diagnosis not present

## 2019-05-02 DIAGNOSIS — K861 Other chronic pancreatitis: Secondary | ICD-10-CM | POA: Diagnosis not present

## 2019-05-02 DIAGNOSIS — K582 Mixed irritable bowel syndrome: Secondary | ICD-10-CM | POA: Diagnosis not present

## 2019-05-02 DIAGNOSIS — Z0000 Encounter for general adult medical examination without abnormal findings: Secondary | ICD-10-CM | POA: Diagnosis not present

## 2019-05-02 DIAGNOSIS — R5382 Chronic fatigue, unspecified: Secondary | ICD-10-CM | POA: Diagnosis not present

## 2019-05-02 DIAGNOSIS — K219 Gastro-esophageal reflux disease without esophagitis: Secondary | ICD-10-CM | POA: Diagnosis not present

## 2019-05-02 DIAGNOSIS — Z79899 Other long term (current) drug therapy: Secondary | ICD-10-CM | POA: Diagnosis not present

## 2019-05-06 ENCOUNTER — Encounter: Payer: Self-pay | Admitting: Urology

## 2019-05-06 ENCOUNTER — Ambulatory Visit (INDEPENDENT_AMBULATORY_CARE_PROVIDER_SITE_OTHER): Payer: Medicare HMO | Admitting: Urology

## 2019-05-06 ENCOUNTER — Other Ambulatory Visit: Payer: Self-pay

## 2019-05-06 VITALS — BP 192/88 | HR 76 | Ht 64.0 in | Wt 161.0 lb

## 2019-05-06 DIAGNOSIS — N3946 Mixed incontinence: Secondary | ICD-10-CM

## 2019-05-06 DIAGNOSIS — R32 Unspecified urinary incontinence: Secondary | ICD-10-CM | POA: Diagnosis not present

## 2019-05-06 MED ORDER — SOLIFENACIN SUCCINATE 5 MG PO TABS: 5.0000 mg | ORAL_TABLET | Freq: Every day | ORAL | 11 refills | Status: DC

## 2019-05-06 MED ORDER — OXYBUTYNIN CHLORIDE ER 10 MG PO TB24: 10.0000 mg | ORAL_TABLET | Freq: Every day | ORAL | 11 refills | Status: DC

## 2019-05-06 NOTE — Progress Notes (Signed)
05/06/2019 8:30 AM   Amber Christensen Dec 22, 1965 [id number]  Referring provider: [name], MD [address] [facility] [place],  [place] [address]  Chief Complaint  Patient presents with  . Follow-up    HPI: I was consulted to assist the patient's chronic left lower quadrant pain. She is gone to the emergency room with this. The pain may lessen some during voiding but she was nonspecific. She also states she gets vaginal burning when she voids. This is been worse in the last year. Cannot have intercourse due to pelvic pain   She can void every 30 minutes due to urgency and sometimes pressure. She cannot hold it for 2 hours. She gets up 5-6 times a night. Sometimes she has urge incontinence. She leaks with coughing sneezing but not bending lifting. She has moderately severe bedwetting. She wears 2 or 3 shields per day that are moderately wet   She reports a history of pelvic abscess. It appears she had a mesh sling or mesh surgery and has had mesh removed possibly from her bladder and or bowel injury.  She had a CT scan in October which showed mild dilation of the right renal pelvis and right upper ureter slightly larger than the left side that was also prominent.   Today Patient had positive hydrodistention January 2020 but the findings were very mild and almost within normal limits with a few petechiae around each ureteral orifice.  She had no mesh issues involving the vagina.  The area in question in the vagina to the right side of the introitus posteriorly was reasonably noted her in the office to examine  Patient understands that I am not convinced she has interstitial cystitis though I gave her an IC diet.  The vaginal burning and redness she will continue to see her gynecologist and we talked about vaginitis.  She understands that there was no mesh issues.  The urgency incontinence and frequency is bothering her the most as well.  I will start the patient  on Myrbetriq 50 mg samples and prescription with interstitial cystitis diet.  I did not bring up [place] at this stage.  I did not bring up installations.   TOday Once he and urgency incontinence a bit better on Myrbetriq but still leaking.  Continues to have dyspareunia the significant.  Daughter is doing much better in [place].    PMH: Past Medical History:  Diagnosis Date  . Allergy    many many allergies  . Anemia    in past  . Anginal pain ([place]) 01/2018   cardiac workup clear  . Anxiety   . 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[place])    pt unaware of this diagnosis  . Bipolar disorder ([place])   . Bladder mass    removed and had tbt. mesh removed  . Blood transfusion without reported diagnosis 1999   received 20 units of blood after tearing esophagus from vomitting so severely after ERCP  . Breast mass    left side biopsy was negative  . Carpal tunnel syndrome    left hand  . Cervical cancer ([place]) 2002  . Chronic kidney disease    cyst on left kidney  . Chronic pancreatitis ([place])    prior to pancreas surgery  . Cirrhosis ([place])   . Clotting disorder ([place])    pt states that sometimes she has difficulty stopping to bleed and other times it is okay  . Depression   . Diabetes mellitus without complication ([place])  has insulin pump  . Dyspnea   . Esophagitis   . GERD (gastroesophageal reflux disease)   . Headache    migraines...takes compazine for this  . Heart disease   . 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 Hypertension   . Irritable bowel syndrome   . Kidney mass    just watching. left kidney mass.  . Liver disease    NASH. has had liver biopsies. negative except for NASH  . NASH (nonalcoholic steatohepatitis)   . NASH (nonalcoholic steatohepatitis)   . Pancreatitis   . PONV (postoperative nausea and vomiting)   . Stroke ([place]) 11/2017   had tia. no longer needs plavix  . Thyroid disease      Surgical History: Past Surgical History:  Procedure Laterality Date  . ABDOMINAL HYSTERECTOMY    . APPENDECTOMY    . BLADDER SURGERY     mesh removed. revision which has caused everything to fall again  . BOWEL RESECTION     took a piece of bowel out after pancreatectomy caused problem with bowel.   Marland Kitchen BREAST BIOPSY Left 1989   benign  . CARDIAC CATHETERIZATION  [id number], 1998   no stents, fatty streaks  . CHOLECYSTECTOMY    . COLON SURGERY    . COLONOSCOPY WITH PROPOFOL N/A 10/08/2018   Procedure: COLONOSCOPY WITH PROPOFOL;  Surgeon: Lollie Sails, MD;  [facility] END[facility]COPY;  Service: Endoscopy;  Laterality: N/A;  . CYST REMOVAL HAND Left 1992   Ganglion cyst removed from Left Wrist  . CYSTECTOMY    . CYSTO WITH HYDRODISTENSION N/A 03/04/2019   Procedure: CYSTOSCOPY/HYDRODISTENSION;  Surgeon: Bjorn Loser, MD;  Location: [facility] ORS;  Service: Urology;  Laterality: N/A;  . DILATATION & CURETTAGE/HYSTEROSCOPY WITH MYOSURE    . DILATION AND CURETTAGE OF UTERUS    . ERCP    . ESOPHAGOGASTRODUODENOSCOPY    . HERNIA REPAIR    . KNEE ARTHROSCOPY Left 1992  . LEFT HEART CATH AND CORONARY ANGIOGRAPHY Left 02/06/2019   Procedure: LEFT HEART CATH AND CORONARY ANGIOGRAPHY;  Surgeon: Yolonda Kida, MD;  Location: [place] CV LAB;  Service: Cardiovascular;  Laterality: Left;  . LIVER BIOPSY    . PANCREAS SURGERY  2006   partial pancreatectomy after endoscope knicked a part of pancreas.   Marland Kitchen PORTA CATH INSERTION N/A 04/10/2017   Procedure: PORTA CATH INSERTION;  Surgeon: Amber Huxley, MD;  Location: [place] [facility] LAB;  Service: Cardiovascular;  Laterality: N/A;  . PORTA CATH INSERTION N/A 02/21/2019   Procedure: PORTA CATH INSERTION;  Surgeon: Amber Huxley, MD;  Location: [place] [facility] LAB;  Service: Cardiovascular;  Laterality: N/A;  . REPAIR OF ESOPHAGUS  2012   3 clamps placed in esophagus  . ROBOTIC ASSISTED LAPAROSCOPIC VENTRAL/INCISIONAL HERNIA REPAIR N/A  03/15/2018   Procedure: ROBOTIC ASSISTED LAPAROSCOPIC VENTRAL HERNIA REPAIR;  Surgeon: Jules Husbands, MD;  Location: [facility] ORS;  Service: General;  Laterality: N/A;  . SMALL BOWEL REPAIR    . TONSILLECTOMY      Home Medications:  Allergies as of 05/06/2019      Reactions   Ciprofloxacin    Rash, nausea vomitting   Demerol [meperidine] Hives, Other (See Comments)   Pt states that this medication causes cardiac arrest.     Dilaudid [hydromorphone Hcl] Nausea And Vomiting, Other (See Comments)   Pt states that this medication causes cardiac arrest.  Metoclopramide Hives   Breathing issues   Morphine And Related Anaphylaxis, Other (See Comments)   Pt states that this medication causes cardiac arrest   Morpholine Salicylate Nausea And Vomiting, Other (See Comments), Rash   CHF   Isosorbide Nitrate Other (See Comments)   Headache   Sulfa Antibiotics Nausea And Vomiting   Flagyl [metronidazole]    Rash, heart racing, nausea vomitting   Adhesive [tape] Rash   Blisters skin Paper tape is okay   Darvon [propoxyphene] Nausea And Vomiting, Rash   Flexeril [cyclobenzaprine] Nausea And Vomiting, Rash   Levaquin [levofloxacin In D5w] Nausea And Vomiting, Rash   Naproxen Rash   Ibuprofen and advil are not a problem   Soma [carisoprodol] Nausea And Vomiting, Rash   Zofran [ondansetron Hcl] Nausea And Vomiting, Rash      Medication List       Accurate as of May 06, 2019  8:30 AM. If you have any questions, ask your nurse or doctor.        STOP taking these medications   HYDROcodone-acetaminophen 5-325 MG tablet Commonly known as: Norco Stopped by: Reece Packer, MD   nystatin cream Commonly known as: MYCOSTATIN Stopped by: Reece Packer, MD     TAKE these medications   ACIDOPHILUS LACTOBACILLUS PO Take 1 capsule by mouth daily.   amLODipine 5 MG tablet Commonly known as: NORVASC Take 5 mg by mouth daily.   CALCIUM 600/VITAMIN D3 PO Take 1 tablet by mouth  daily.   Creon 36000 UNITS Cpep capsule Generic drug: lipase/protease/amylase Take 144,000 Units by mouth 3 (three) times daily before meals.   cyanocobalamin 2000 MCG tablet Take 2,000 mcg by mouth daily.   Dexlansoprazole 30 MG capsule Take 30 mg by mouth daily.   dicyclomine 10 MG capsule Commonly known as: BENTYL Take 20 mg by mouth 4 (four) times daily as needed for spasms (abdominal spasms.).   gabapentin 300 MG capsule Commonly known as: NEURONTIN Take 2 capsules (600 mg total) by mouth 3 (three) times daily.   hydrochlorothiazide 12.5 MG tablet Commonly known as: HYDRODIURIL Take 1 tablet (12.5 mg total) by mouth daily as needed (for fluid).   insulin pump Soln Inject into the skin. NOVOLOG 100 UNIT/ML injection   lisinopril 40 MG tablet Commonly known as: ZESTRIL Take 40 mg by mouth daily.   metoprolol succinate 25 MG 24 hr tablet Commonly known as: TOPROL-XL Take 25 mg by mouth daily.   mirabegron ER 50 MG Tb24 tablet Commonly known as: MYRBETRIQ Take 1 tablet (50 mg total) by mouth daily.   NovoLOG 100 UNIT/ML injection Generic drug: insulin aspart Inject 0-7 Units into the skin See admin instructions. Uses with insulin pump   V-R MAGNESIUM 250 MG Tabs Generic drug: Magnesium Take 250 mg by mouth daily.   Vitamin D-3 125 MCG (5000 UT) Tabs Take 5,000 Units by mouth daily.   vitamin E 180 MG (400 UNITS) capsule Take 400 Units by mouth daily.       Allergies:  Allergies  Allergen Reactions  . Ciprofloxacin     Rash, nausea vomitting  . Demerol [Meperidine] Hives and Other (See Comments)    Pt states that this medication causes cardiac arrest.    . Dilaudid [Hydromorphone Hcl] Nausea And Vomiting and Other (See Comments)    Pt states that this medication causes cardiac arrest.    . Metoclopramide Hives    Breathing issues  . Morphine And Related Anaphylaxis and Other (See Comments)  Pt states that this medication causes cardiac arrest  .  Morpholine Salicylate Nausea And Vomiting, Other (See Comments) and Rash    CHF  . Isosorbide Nitrate Other (See Comments)    Headache   . Sulfa Antibiotics Nausea And Vomiting  . Flagyl [Metronidazole]     Rash, heart racing, nausea vomitting  . Adhesive [Tape] Rash    Blisters skin Paper tape is okay  . Darvon [Propoxyphene] Nausea And Vomiting and Rash  . Flexeril [Cyclobenzaprine] Nausea And Vomiting and Rash  . Levaquin [Levofloxacin In D5w] Nausea And Vomiting and Rash  . Naproxen Rash    Ibuprofen and advil are not a problem  . Soma [Carisoprodol] Nausea And Vomiting and Rash  . Zofran [Ondansetron Hcl] Nausea And Vomiting and Rash    Family History: Family History  Problem Relation Age of Onset  . Cirrhosis Mother   . Colon cancer Mother 55       TAH/BSO in ~30; deceased at 101  . Hypertension Mother   . Breast cancer Mother 23       unconfirmed  . Hypertension Father   . Prostate cancer Father 88       currently 80  . Other Father        liver disease  . Breast cancer Maternal Aunt        age at dx unk.; currently 26  . Cervical cancer Maternal Aunt   . Cancer Maternal Uncle        unk. type; currently 65s  . Stomach cancer Paternal Aunt 44       deceased at 27  . Breast cancer Maternal Grandmother 88       possibly bilateral in 14s; deceased 72  . Non-Hodgkin's lymphoma Daughter        unconfirmed; currently 23  . Cancer Maternal Aunt        hematologic malignancy; deceased 73    Social History:  reports that she has never smoked. She has never used smokeless tobacco. She reports current alcohol use. She reports that she does not use drugs.  ROS:                                        Physical Exam: BP (!) 192/88   Pulse 76   Ht 5' 4"  (1.626 m)   Wt 73 kg   BMI 27.64 kg/m   Constitutional:  Alert and oriented, No acute distress.   Laboratory Data: Lab Results  Component Value Date   WBC 10.8 (H) 04/19/2019   HGB 13.8  04/19/2019   HCT 39.7 04/19/2019   MCV 84.5 04/19/2019   PLT 193 04/19/2019    Lab Results  Component Value Date   CREATININE 0.80 04/19/2019    No results found for: PSA  No results found for: TESTOSTERONE  Lab Results  Component Value Date   HGBA1C 9.4 (H) 09/23/2018    Urinalysis    Component Value Date/Time   COLORURINE STRAW (A) 04/19/2019 1239   APPEARANCEUR CLEAR (A) 04/19/2019 1239   APPEARANCEUR Clear 02/18/2019 0910   LABSPEC 1.002 (L) 04/19/2019 1239   LABSPEC 1.018 01/31/2014 1455   PHURINE 5.0 04/19/2019 1239   GLUCOSEU >=500 (A) 04/19/2019 1239   GLUCOSEU >=500 01/31/2014 1455   HGBUR NEGATIVE 04/19/2019 [place] 04/19/2019 1239   BILIRUBINUR Negative 02/18/2019 0910   BILIRUBINUR Negative 01/31/2014 1455  KETONESUR NEGATIVE 04/19/2019 1239   PROTEINUR NEGATIVE 04/19/2019 1239   NITRITE NEGATIVE 04/19/2019 1239   LEUKOCYTESUR SMALL (A) 04/19/2019 1239   LEUKOCYTESUR Negative 01/31/2014 1455    Pertinent Imaging:   Assessment & Plan: Oxybutynin ER 10 mg and Vesicare 5 mg prescriptions given.  Physical therapy consult given.  See in 8 weeks.  I am not convinced the patient is interstitial cystitis and she will follow-up with gynecology for ongoing vaginal issues  There are no diagnoses linked to this encounter.  No follow-ups on file.  Reece Packer, MD  [facility] [address], [place], [place] [address] [phone]

## 2019-05-07 DIAGNOSIS — E119 Type 2 diabetes mellitus without complications: Secondary | ICD-10-CM | POA: Diagnosis not present

## 2019-05-07 DIAGNOSIS — K760 Fatty (change of) liver, not elsewhere classified: Secondary | ICD-10-CM | POA: Diagnosis not present

## 2019-05-07 DIAGNOSIS — Z794 Long term (current) use of insulin: Secondary | ICD-10-CM | POA: Diagnosis not present

## 2019-05-07 DIAGNOSIS — K861 Other chronic pancreatitis: Secondary | ICD-10-CM | POA: Diagnosis not present

## 2019-05-10 DIAGNOSIS — R197 Diarrhea, unspecified: Secondary | ICD-10-CM | POA: Diagnosis not present

## 2019-05-10 DIAGNOSIS — E876 Hypokalemia: Secondary | ICD-10-CM | POA: Diagnosis not present

## 2019-05-10 DIAGNOSIS — R21 Rash and other nonspecific skin eruption: Secondary | ICD-10-CM | POA: Diagnosis not present

## 2019-05-22 DIAGNOSIS — E139 Other specified diabetes mellitus without complications: Secondary | ICD-10-CM | POA: Diagnosis not present

## 2019-05-22 DIAGNOSIS — Z9641 Presence of insulin pump (external) (internal): Secondary | ICD-10-CM | POA: Diagnosis not present

## 2019-05-22 DIAGNOSIS — E1065 Type 1 diabetes mellitus with hyperglycemia: Secondary | ICD-10-CM | POA: Diagnosis not present

## 2019-05-23 ENCOUNTER — Other Ambulatory Visit: Payer: Self-pay

## 2019-05-23 ENCOUNTER — Ambulatory Visit: Payer: Medicare HMO | Attending: Urology

## 2019-05-23 DIAGNOSIS — M4125 Other idiopathic scoliosis, thoracolumbar region: Secondary | ICD-10-CM

## 2019-05-23 DIAGNOSIS — M533 Sacrococcygeal disorders, not elsewhere classified: Secondary | ICD-10-CM

## 2019-05-23 DIAGNOSIS — R278 Other lack of coordination: Secondary | ICD-10-CM | POA: Insufficient documentation

## 2019-05-23 DIAGNOSIS — M62838 Other muscle spasm: Secondary | ICD-10-CM

## 2019-05-23 DIAGNOSIS — N736 Female pelvic peritoneal adhesions (postinfective): Secondary | ICD-10-CM | POA: Insufficient documentation

## 2019-05-23 NOTE — Patient Instructions (Signed)
   [facility]  [facility] Address: [address], [place] [address] Phone: [phone]  Stabilization: Diaphragmatic Breathing    Lie with knees bent, feet flat. Place one hand on stomach, other on chest. Breathe deeply through nose, lifting belly hand without any motion of hand on chest. Practice for at least 5 min. Per night. Inhale for 4 seconds, hold for 4 seconds, exhale for 4 seconds, hold for 4 seconds (square breathing)   3-Way Wall Stretches for Pelvic Floor Lengthening   Bring bottom close to the wall and gently press straighten knees to feel a stretch down the back of you thighs. Hold while taking 5 deep belly breaths and feeling the pelvic floor relax and lower on each inhale.    Let your legs fall to the side to feel a stretch on the inside of your thighs. If the stretch is too intense you can use a pillow to take some of the weight off by wedging it on the outside of your hips. Hold while taking 5 deep belly breaths and feeling the pelvic floor relax and lower on each inhale.    Slide feet down the wall and move hips slightly away from the wall and then let your knees fall to the sides so you feel a stretch on the inside of the thighs near your groin. Hold while taking 5 deep belly breaths and feeling the pelvic floor relax and lower on each inhale.     *Perform each stretch in sequence 3 times, once a day

## 2019-05-23 NOTE — Therapy (Addendum)
[place] MAIN [facility] SERVICES [address] [place], [place], [address] Phone: [phone]   Fax:  [phone]  Physical Therapy Evaluation  The patient has been informed of current processes in place at Outpatient Rehab to protect patients from Covid-19 exposure including social distancing, schedule modifications, and new cleaning procedures. After discussing their particular risk with a therapist based on the patient's personal risk factors, the patient has decided to proceed with in-person therapy.   Patient Details  Name: Amber Christensen MRN: [id number] Date of Birth: 21-Mar-1965 No data recorded  Encounter Date: 05/23/2019  PT End of Session - 05/23/19 1030    Visit Number  1    Number of Visits  20    Date for PT Re-Evaluation  08/01/19    Authorization Type  [employer]    Authorization Time Period  05/23/19 through 08/01/19    Authorization - Visit Number  1    Authorization - Number of Visits  10    Progress Note Due on Visit  10    PT Start Time  1005    PT Stop Time  1115    PT Time Calculation (min)  70 min    Activity Tolerance  Patient tolerated treatment well;No increased pain    Behavior During Therapy  WFL for tasks assessed/performed       Past Medical History:  Diagnosis Date  . Allergy    many many allergies  . Anemia    in past  . Anginal pain ([place]) 01/2018   cardiac workup clear  . Anxiety   . 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[place])    pt unaware of this diagnosis  . Bipolar disorder ([place])   . Bladder mass    removed and had tbt. mesh removed  . Blood transfusion without reported diagnosis 1999   received 20 units of blood after tearing esophagus from vomitting so severely after ERCP  . Breast mass    left side biopsy was negative  . Carpal tunnel syndrome    left hand  . Cervical cancer ([place]) 2002  . Chronic kidney disease    cyst on left kidney  . Chronic pancreatitis ([place])    prior to pancreas surgery   . Cirrhosis ([place])   . Clotting disorder ([place])    pt states that sometimes she has difficulty stopping to bleed and other times it is okay  . Depression   . Diabetes mellitus without complication (HCC)    has insulin pump  . Dyspnea   . Esophagitis   . GERD (gastroesophageal reflux disease)   . Headache    migraines...takes compazine for this  . Heart disease   . 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 Hypertension   . Irritable bowel syndrome   . Kidney mass    just watching. left kidney mass.  . Liver disease    NASH. has had liver biopsies. negative except for NASH  . NASH (nonalcoholic steatohepatitis)   . NASH (nonalcoholic steatohepatitis)   . Pancreatitis   . PONV (postoperative nausea and vomiting)   . Stroke ([place]) 11/2017   had tia. no longer needs plavix  . Thyroid disease     Past Surgical History:  Procedure Laterality Date  . ABDOMINAL HYSTERECTOMY    . APPENDECTOMY    . BLADDER SURGERY     mesh removed. revision which has caused everything to fall  again  . BOWEL RESECTION     took a piece of bowel out after pancreatectomy caused problem with bowel.   Marland Kitchen BREAST BIOPSY Left 1989   benign  . CARDIAC CATHETERIZATION  [id number], 1998   no stents, fatty streaks  . CHOLECYSTECTOMY    . COLON SURGERY    . COLONOSCOPY WITH PROPOFOL N/A 10/08/2018   Procedure: COLONOSCOPY WITH PROPOFOL;  Surgeon: Lollie Sails, MD;  Location: [facility] ENDOSCOPY;  Service: Endoscopy;  Laterality: N/A;  . CYST REMOVAL HAND Left 1992   Ganglion cyst removed from Left Wrist  . CYSTECTOMY    . CYSTO WITH HYDRODISTENSION N/A 03/04/2019   Procedure: CYSTOSCOPY/HYDRODISTENSION;  Surgeon: Bjorn Loser, MD;  Location: [facility] ORS;  Service: Urology;  Laterality: N/A;  . DILATATION & CURETTAGE/HYSTEROSCOPY WITH MYOSURE    . DILATION AND CURETTAGE OF UTERUS    . ERCP    . ESOPHAGOGASTRODUODENOSCOPY    . HERNIA REPAIR     . KNEE ARTHROSCOPY Left 1992  . LEFT HEART CATH AND CORONARY ANGIOGRAPHY Left 02/06/2019   Procedure: LEFT HEART CATH AND CORONARY ANGIOGRAPHY;  Surgeon: Yolonda Kida, MD;  Location: [place] CV LAB;  Service: Cardiovascular;  Laterality: Left;  . LIVER BIOPSY    . PANCREAS SURGERY  2006   partial pancreatectomy after endoscope knicked a part of pancreas.   Marland Kitchen PORTA CATH INSERTION N/A 04/10/2017   Procedure: PORTA CATH INSERTION;  Surgeon: Algernon Huxley, MD;  Location: [place] CV LAB;  Service: Cardiovascular;  Laterality: N/A;  . PORTA CATH INSERTION N/A 02/21/2019   Procedure: PORTA CATH INSERTION;  Surgeon: Algernon Huxley, MD;  Location: [place] CV LAB;  Service: Cardiovascular;  Laterality: N/A;  . REPAIR OF ESOPHAGUS  2012   3 clamps placed in esophagus  . ROBOTIC ASSISTED LAPAROSCOPIC VENTRAL/INCISIONAL HERNIA REPAIR N/A 03/15/2018   Procedure: ROBOTIC ASSISTED LAPAROSCOPIC VENTRAL HERNIA REPAIR;  Surgeon: Jules Husbands, MD;  Location: [facility] ORS;  Service: General;  Laterality: N/A;  . SMALL BOWEL REPAIR    . TONSILLECTOMY      There were no vitals filed for this visit.  Pelvic Floor Physical Therapy Evaluation and Assessment  SCREENING  Falls in last 6 mo: Yes, 1 trip down front steps, landed on L knee.    Patient's communication preference:   Red Flags:  Have you had any night sweats? no Unexplained weight loss? no Saddle anesthesia? no Unexplained changes in bowel or bladder habits? Yes, FI started ~ 1 month ago  SUBJECTIVE  Patient reports: Ever since she had the hysterectomy things started going haywire, got PID, things calmed down for a while, went years in pain with nobody believing her that it was the mesh. Was diagnosed with lichen sclerosis, Dr. Matilde Sprang did the cystocopy and hydro-distension and insists issue is not bladder, sent her to her primary care to try to find out the underlying cause. Had surgery to try to remove mesh, but could not  get it all. Had to have a pandectomy~ 10 years ago, they had to disconnect her intestine, remove part of it and reconnect it in a different area, making her anatomy different. She is now worried now that when he went in to remove the mesh he un-did or changed where her intestines are sitting now because the pain is worst in the area that they had been messing around in.   Broke her tailbone and L shoulder~ 15 years ago falling on the ice  Feels like she  is losing her husband of 6 years. because she can not be intimate with him for over a year. Lost her last husband because of these issues. He has started drinking a lot and has been disconnecting from her.  Was told she has slight scoliosis and LLD.  Precautions:  Bipolar disorder, Cervical cancer, S/p hysterectomy, hernia, bladder sling, attempted removal of mesh for hernia and sling, thyroid disease, HTN, stroke (no residual Sx), Headaches, GRD, Type 2 diabetes, dyspnea, cirrhosis, chronic pancreatitis, chronic kidney disease, autoimmune Addison's disease, Anxiety, multiple medical allergies, Anginal pain.   Social/Family/Vocational History:   On Disability  Recent Procedures/Tests/Findings:  Cystoscopy/hydrodistension Jan 18th with Dr. Matilde Sprang, no concerns "fer red dots"  Obstetrical History: 3 vaginal deliveries without complication.   Gynecological History: Had PID following her hysterectomy and had to have a lot of pelvic surgery  Urinary History: SUI, Frequency, urgency ~ 8-10 times per day.  Gastrointestinal History: Fecal incontinence, urgency, frequency  Sexual activity/pain: Unable to be intimate because of the pain for >1 year  Location of pain: pelvic/vaginal (with intercourse Current pain:  3/10  Max pain:  7/10 Least pain:  /10 Nature of pain: Sharp/stabbing.  Patient Goals: Be able to havve intercourse and do ADL's without increased pain (vacuuming, pulling motions)   OBJECTIVE  Posture/Observations:   Sitting: slouched, FHP Standing:   Palpation/Segmental Motion/Joint Play: Deferred to follow up  Special tests:   *Pt. Reports known scoliosis and LLD not assessed due to time  Range of Motion/Flexibilty: Deferred to follow up Spine: Hips:   Strength/MMT: Deferred to follow up  LE MMT  LE MMT Left Right  Hip flex:  (L2) /5 /5  Hip ext: /5 /5  Hip abd: /5 /5  Hip add: /5 /5  Hip IR /5 /5  Hip ER /5 /5     Abdominal:  Palpation: TTP throughout Diastasis: Coordination: Pt. Has very hard time coordinating diaphragmatic breath  Pelvic Floor External Exam: Deferred to follow up  Introitus Appears:  Skin integrity:  Palpation: Cough: Prolapse visible?: Scar mobility:  Internal Vaginal Exam: Strength (PERF):  Symmetry: Palpation: Prolapse:   Internal Rectal Exam: Strength (PERF): Symmetry: Palpation: Prolapse:   Gait Analysis: Deferred to follow up    Pelvic Floor Outcome Measures: FOTO: PFDI Prolapse: 38 PFDI Bowel: 29 Urinary Problem: 52 Bowel Leakage: 61 PFDI Urinary: 38 PFDI Pain: 38    INTERVENTIONS THIS SESSION: Self-care: Educated on the structure and function of the pelvic floor in relation to their symptoms as well as the POC, and initial HEP in order to set patient expectations and understanding from which we will build on in the future sessions. Educated on the importance of her and her husband seeking couples/sexual counseling for her health.  Theract: educated on and practiced diaphragmatic breathing to prepare for follow-up visits and to begin to lengthen diaphragm and PFM to allow for decreased pain/improved function.  Total time: 70 min.                  Objective measurements completed on examination: See above findings.                PT Short Term Goals - 05/23/19 1322      PT SHORT TERM GOAL #1   Title  Pt. will begin counseling to allow for full body healing of physical and mental/emotional wounds  that are intertwined in her pelvic pain to allow maximal healing.    Baseline  Pt. not currently in therapy, aware  that it is needed.    Time  5    Period  Weeks    Status  New    Target Date  06/27/19      PT SHORT TERM GOAL #2   Title  Patient will demonstrate functional recruitment of TA with breathing, sit-to-stand, squatting/lifting, and walking to allow for improved pelvic brace coordination, improved balance, and decreased downward pressure on the pelvic organs.    Baseline  breathing dysfunction is impacting Pt's pelvic pain and allowing for continued POP/ pelvic dysfunction.    Time  5    Period  Weeks    Status  New    Target Date  06/27/19      PT SHORT TERM GOAL #3   Title  Patient will report a reduction in pain to no greater than 3/10 with basic ADL's over the prior week to demonstrate symptom improvement.    Baseline  Pt. having pain up to 8/10 with ADL's including vacuuming and pulling.    Time  5    Period  Weeks    Status  New    Target Date  06/27/19      PT SHORT TERM GOAL #4   Title  Internal assessment will be performed to allow for further internal PFM POC development    Baseline  Pt. has vulvodynia and lichen sclerosis making any touch very painful.    Time  5    Period  Weeks    Status  New    Target Date  06/27/19        PT Long Term Goals - 05/23/19 1328      PT LONG TERM GOAL #1   Title  Patient will report no episodes of SUI or FI over the course of the prior two weeks to demonstrate improved functional ability.    Baseline  Having SUI and FI regularly    Time  10    Period  Weeks    Status  New    Target Date  08/01/19      PT LONG TERM GOAL #2   Title  Patient will report no pain with intercourse to demonstrate improved functional ability.    Baseline  Pt. unable to have intimate touch for 1+ years due to pain    Time  20    Period  Weeks    Target Date  10/10/19      PT LONG TERM GOAL #3   Title  Patient will describe pain no greater  than 2/10 during moderate level activities such as hiking or heavy yardwork to demonstrate improved functional ability.    Baseline  pain 8/10 with vacuuming and pulling    Time  10    Period  Weeks    Status  New    Target Date  08/01/19      PT LONG TERM GOAL #4   Title  Pt. will find a lubricant combination that work for her/her partner and demonstrate ability to use tools and HEP to maintain decreased spasm/pain to allow for long-term relief.    Baseline  "feels like a knife and made his penis bleed"    Time  10    Period  Weeks    Status  New    Target Date  10/10/19      PT LONG TERM GOAL #5   Title  Pt. will improve in FOTO score by 15 points in each category to demonstrate improved function.    Baseline  FOTO:PFDI Prolapse: 38 PFDI Bowel: 29 Urinary Problem: 52 Bowel Leakage: 61 PFDI Urinary: 38 PFDIPain: 38    Time  20    Period  Weeks    Status  New    Target Date  10/10/19             Plan - 05/23/19 1153    Clinical Impression Statement  Pt. is a 54 y/o female who presents today with cheif c/o SUI, FI, pelvic pain and Dyspareunia. Her PMH is significant for Bipolar disorder, Cervical cancer, S/p hysterectomy, hernia, bladder sling, attempted removal of mesh for hernia and sling, thyroid disease, HTN, stroke (no residual Sx), Headaches, GRD, Type 2 diabetes, dyspnea, cirrhosis, chronic pancreatitis/pandectomy, chronic kidney disease, autoimmune Addison's disease, Anxiety, multiple medical allergies, lichen sclerosis and Anginal pain. Her physical exam revealed breathing dusfunction and decreased coordination of muscles surrounding the pelvis, as well as Hx. that strongly suggests LLD, minor scoliosis, and PFM dysfunction and spasm. She will benefit from skilled pelvic health PT to address the noted the defecits and continue to assess for and address other potential causes of Sx.    Personal Factors and Comorbidities  Comorbidity 3+;Time since onset of  injury/illness/exacerbation;Sex;Past/Current Experience    Comorbidities  Bipolar disorder, Cervical cancer, S/p hysterectomy, hernia, bladder sling, attempted removal of mesh for hernia and sling, thyroid disease, HTN, stroke (no residual Sx), Headaches, GRD, Type 2 diabetes, dyspnea, cirrhosis, chronic pancreatitis, chronic kidney disease, autoimmune Addison's disease, Anxiety, multiple medical allergies, Anginal pain.    Examination-Activity Limitations  Carry;Lift;Bend;Reach Overhead;Continence;Toileting;Stand    Examination-Participation Restrictions  Interpersonal Relationship;Yard Work;Cleaning;Laundry;Community Activity;Shop    Stability/Clinical Decision Making  Unstable/Unpredictable    Clinical Decision Making  High    Rehab Potential  Fair    PT Frequency  1x / week    PT Duration  Other (comment)   20 weeks   PT Treatment/Interventions  ADLs/Self Care Home Management;Aquatic Therapy;Biofeedback;Moist Heat;Electrical Stimulation;Cryotherapy;Traction;Ultrasound;Therapeutic activities;Functional mobility training;Gait training;Therapeutic exercise;Neuromuscular re-education;Patient/family education;Scar mobilization;Cognitive remediation;Manual techniques;Passive range of motion;Dry needling;Taping;Spinal Manipulations;Joint Manipulations    PT Next Visit Plan  further assess posture/pelvic alignment and perform spasm release vs TPDN as appropriate, review diaphragmatic breathing and ask about 3-way wal stretch success.    PT Home Exercise Plan  Diaphragmatic breathing, [facility] couples therapy, 3-way wall stretch    Recommended Other Services  Couples/sex threrapy.    Consulted and Agree with Plan of Care  Patient       Patient will benefit from skilled therapeutic intervention in order to improve the following deficits and impairments:  Hypomobility, Increased muscle spasms, Cardiopulmonary status limiting activity, Decreased range of motion, Decreased scar mobility, Impaired tone,  Improper body mechanics, Decreased activity tolerance, Decreased coordination, Increased fascial restricitons, Impaired flexibility, Postural dysfunction, Pain  Visit Diagnosis: Other idiopathic scoliosis, thoracolumbar region  Sacrococcygeal disorders, not elsewhere classified  Other muscle spasm  Other lack of coordination  Pelvic peritoneal adhesions, female     Problem List Patient Active Problem List   Diagnosis Date Noted  . Hyperglycemia 09/23/2018  . S/P ventral herniorrhaphy 03/15/2018  . Ventral hernia without obstruction or gangrene   . Hypertension 03/21/2017  . Diabetes mellitus without complication ([place]) 16/11/9602  . Pancreatitis 03/21/2017  . Poor venous access 03/21/2017  . Vasculitis ([place]) 08/03/2016  . Acute maculopapular rash 04/21/2016  . Maculopapular rash, generalized 04/20/2016  . Unstable angina ([place]) 09/28/2015   Willa Rough DPT, ATC Willa Rough 05/24/2019, 2:04 PM  [place] [facility]  SERVICES [place], [place], [address] Phone: [phone]   Fax:  [phone]  Name: AVEY MCMANAMON MRN: [id number] Date of Birth: November 03, 1965

## 2019-05-27 ENCOUNTER — Ambulatory Visit: Admission: RE | Admit: 2019-05-27 | Payer: Medicare HMO | Source: Ambulatory Visit

## 2019-05-28 ENCOUNTER — Ambulatory Visit
Admission: RE | Admit: 2019-05-28 | Discharge: 2019-05-28 | Disposition: A | Discharge status: Home / Self Care | Payer: Medicare HMO | Source: Ambulatory Visit | Attending: Vascular Surgery | Admitting: Vascular Surgery

## 2019-05-28 DIAGNOSIS — Z452 Encounter for adjustment and management of vascular access device: Secondary | ICD-10-CM | POA: Insufficient documentation

## 2019-05-28 MED ORDER — HEPARIN SOD (PORK) LOCK FLUSH 100 UNIT/ML IV SOLN: 500.0000 [IU] | Freq: Once | INTRAVENOUS | Status: AC

## 2019-05-28 MED ORDER — HEPARIN SOD (PORK) LOCK FLUSH 100 UNIT/ML IV SOLN
INTRAVENOUS | Status: AC
  Administered 2019-05-28: 500 [IU] via INTRAVENOUS
  Filled 2019-05-28: qty 5

## 2019-05-28 MED ORDER — SODIUM CHLORIDE 0.9% FLUSH
10.0000 mL | Freq: Once | INTRAVENOUS | Status: AC
  Administered 2019-05-28: 10 mL via INTRAVENOUS

## 2019-05-29 NOTE — Addendum Note (Signed)
Addended by: Letitia Libra T on: 05/29/2019 03:34 PM   Modules accepted: Orders

## 2019-05-30 ENCOUNTER — Ambulatory Visit: Payer: Medicare HMO

## 2019-05-30 DIAGNOSIS — J309 Allergic rhinitis, unspecified: Secondary | ICD-10-CM | POA: Diagnosis not present

## 2019-05-30 DIAGNOSIS — J300 Vasomotor rhinitis: Secondary | ICD-10-CM | POA: Diagnosis not present

## 2019-05-30 DIAGNOSIS — Z91030 Bee allergy status: Secondary | ICD-10-CM | POA: Diagnosis not present

## 2019-05-30 DIAGNOSIS — R21 Rash and other nonspecific skin eruption: Secondary | ICD-10-CM | POA: Diagnosis not present

## 2019-06-06 ENCOUNTER — Ambulatory Visit: Payer: Medicare HMO

## 2019-06-07 ENCOUNTER — Other Ambulatory Visit: Payer: Self-pay

## 2019-06-07 ENCOUNTER — Emergency Department
Admission: EM | Admit: 2019-06-07 | Discharge: 2019-06-07 | Disposition: A | Discharge status: Home / Self Care | Payer: Medicare HMO | Attending: Emergency Medicine | Admitting: Emergency Medicine

## 2019-06-07 ENCOUNTER — Emergency Department: Payer: Medicare HMO

## 2019-06-07 DIAGNOSIS — R1011 Right upper quadrant pain: Secondary | ICD-10-CM

## 2019-06-07 DIAGNOSIS — K529 Noninfective gastroenteritis and colitis, unspecified: Secondary | ICD-10-CM | POA: Insufficient documentation

## 2019-06-07 DIAGNOSIS — E119 Type 2 diabetes mellitus without complications: Secondary | ICD-10-CM | POA: Diagnosis not present

## 2019-06-07 DIAGNOSIS — Z79899 Other long term (current) drug therapy: Secondary | ICD-10-CM | POA: Insufficient documentation

## 2019-06-07 DIAGNOSIS — I10 Essential (primary) hypertension: Secondary | ICD-10-CM | POA: Insufficient documentation

## 2019-06-07 DIAGNOSIS — K760 Fatty (change of) liver, not elsewhere classified: Secondary | ICD-10-CM | POA: Diagnosis not present

## 2019-06-07 DIAGNOSIS — Z794 Long term (current) use of insulin: Secondary | ICD-10-CM | POA: Diagnosis not present

## 2019-06-07 LAB — COMPREHENSIVE METABOLIC PANEL
ALT: 43 U/L (ref 0–44)
AST: 29 U/L (ref 15–41)
Albumin: 4 g/dL (ref 3.5–5.0)
Alkaline Phosphatase: 161 U/L — ABNORMAL HIGH (ref 38–126)
Anion gap: 11 (ref 5–15)
BUN: 14 mg/dL (ref 6–20)
CO2: 24 mmol/L (ref 22–32)
Calcium: 8 mg/dL — ABNORMAL LOW (ref 8.9–10.3)
Chloride: 103 mmol/L (ref 98–111)
Creatinine, Ser: 0.77 mg/dL (ref 0.44–1.00)
GFR calc Af Amer: >60 mL/min (ref >60)
GFR calc non Af Amer: >60 mL/min (ref >60)
Glucose, Bld: 341 mg/dL — ABNORMAL HIGH (ref 70–99)
Potassium: 3.4 mmol/L — ABNORMAL LOW (ref 3.5–5.1)
Sodium: 138 mmol/L (ref 135–145)
Total Bilirubin: 3.3 mg/dL — ABNORMAL HIGH (ref 0.3–1.2)
Total Protein: 7.2 g/dL (ref 6.5–8.1)

## 2019-06-07 LAB — URINALYSIS, COMPLETE (UACMP) WITH MICROSCOPIC
Bilirubin Urine: NEGATIVE
Glucose, UA: >=500 mg/dL — AB
Hgb urine dipstick: NEGATIVE
Ketones, ur: NEGATIVE mg/dL
Leukocytes,Ua: NEGATIVE
Nitrite: NEGATIVE
Protein, ur: NEGATIVE mg/dL
Specific Gravity, Urine: 1.002 — ABNORMAL LOW (ref 1.005–1.030)
pH: 6 (ref 5.0–8.0)

## 2019-06-07 LAB — CBC WITH DIFFERENTIAL/PLATELET
Abs Immature Granulocytes: 0.02 10*3/uL (ref 0.00–0.07)
Basophils Absolute: 0 10*3/uL (ref 0.0–0.1)
Basophils Relative: 1 %
Eosinophils Absolute: 0.1 10*3/uL (ref 0.0–0.5)
Eosinophils Relative: 2 %
HCT: 37.3 % (ref 36.0–46.0)
Hemoglobin: 13.1 g/dL (ref 12.0–15.0)
Immature Granulocytes: 0 %
Lymphocytes Relative: 34 %
Lymphs Abs: 1.9 10*3/uL (ref 0.7–4.0)
MCH: 29.3 pg (ref 26.0–34.0)
MCHC: 35.1 g/dL (ref 30.0–36.0)
MCV: 83.4 fL (ref 80.0–100.0)
Monocytes Absolute: 0.4 10*3/uL (ref 0.1–1.0)
Monocytes Relative: 7 %
Neutro Abs: 3.1 10*3/uL (ref 1.7–7.7)
Neutrophils Relative %: 56 %
Platelets: 184 10*3/uL (ref 150–400)
RBC: 4.47 MIL/uL (ref 3.87–5.11)
RDW: 12.6 % (ref 11.5–15.5)
WBC: 5.5 10*3/uL (ref 4.0–10.5)
nRBC: 0 % (ref 0.0–0.2)

## 2019-06-07 LAB — LIPASE, BLOOD: Lipase: 17 U/L (ref 11–51)

## 2019-06-07 MED ORDER — HEPARIN SOD (PORK) LOCK FLUSH 100 UNIT/ML IV SOLN
INTRAVENOUS | Status: AC
  Administered 2019-06-07: 500 [IU] via INTRAVENOUS
  Filled 2019-06-07: qty 5

## 2019-06-07 MED ORDER — PROMETHAZINE HCL 25 MG/ML IJ SOLN
12.5000 mg | Freq: Once | INTRAMUSCULAR | Status: AC
  Administered 2019-06-07: 12.5 mg via INTRAVENOUS
  Filled 2019-06-07: qty 1

## 2019-06-07 MED ORDER — HEPARIN SOD (PORK) LOCK FLUSH 100 UNIT/ML IV SOLN: 500.0000 [IU] | Freq: Once | INTRAVENOUS | Status: AC

## 2019-06-07 MED ORDER — LACTATED RINGERS IV BOLUS
1000.0000 mL | Freq: Once | INTRAVENOUS | Status: AC
  Administered 2019-06-07: 1000 mL via INTRAVENOUS

## 2019-06-07 MED ORDER — IOHEXOL 300 MG/ML  SOLN
100.0000 mL | Freq: Once | INTRAMUSCULAR | Status: AC | PRN
  Administered 2019-06-07: 100 mL via INTRAVENOUS

## 2019-06-07 MED ORDER — FENTANYL CITRATE (PF) 100 MCG/2ML IJ SOLN
50.0000 ug | Freq: Once | INTRAMUSCULAR | Status: AC
  Administered 2019-06-07: 50 ug via INTRAVENOUS
  Filled 2019-06-07: qty 2

## 2019-06-07 MED ORDER — DIPHENHYDRAMINE HCL 50 MG/ML IJ SOLN
12.5000 mg | Freq: Once | INTRAMUSCULAR | Status: AC
  Administered 2019-06-07: 12.5 mg via INTRAVENOUS
  Filled 2019-06-07: qty 1

## 2019-06-07 NOTE — ED Provider Notes (Signed)
[facility] Emergency Department Provider Note   ____________________________________________   First MD Initiated Contact with Patient 06/07/19 [phone]     (approximate)  I have reviewed the triage vital signs and the nursing notes.   HISTORY  Chief Complaint Flank Pain    HPI Amber Christensen is a 54 y.o. female with past medical history of diabetes, pancreatitis, and NASH who presents to the ED complaining of abdominal pain.  Patient reports that she has had constant sharp pain to the right upper quadrant of her abdomen for the past 2 days.  It seems to radiate around to her right flank when most severe, and is not exacerbated or alleviated by anything in particular.  Is been associated with nausea, but she denies any vomiting or changes in her bowel movements.  She has not had any dysuria or hematuria with this episode, but does endorse a history of kidney stones.  She has not had any recent fevers and denies any cough, shortness of breath, or chest pain.        Past Medical History:  Diagnosis Date  . Allergy    many many allergies  . Anemia    in past  . Anginal pain ([place]) 01/2018   cardiac workup clear  . Anxiety   . 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[place])    pt unaware of this diagnosis  . Bipolar disorder ([place])   . Bladder mass    removed and had tbt. mesh removed  . Blood transfusion without reported diagnosis 1999   received 20 units of blood after tearing esophagus from vomitting so severely after ERCP  . Breast mass    left side biopsy was negative  . Carpal tunnel syndrome    left hand  . Cervical cancer ([place]) 2002  . Chronic kidney disease    cyst on left kidney  . Chronic pancreatitis ([place])    prior to pancreas surgery  . Cirrhosis ([place])   . Clotting disorder ([place])    pt states that sometimes she has difficulty stopping to bleed and other times it is okay  . Depression   . Diabetes mellitus without complication (HCC)    has  insulin pump  . Dyspnea   . Esophagitis   . GERD (gastroesophageal reflux disease)   . Headache    migraines...takes compazine for this  . Heart disease   . 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 Hypertension   . Irritable bowel syndrome   . Kidney mass    just watching. left kidney mass.  . Liver disease    NASH. has had liver biopsies. negative except for NASH  . NASH (nonalcoholic steatohepatitis)   . NASH (nonalcoholic steatohepatitis)   . Pancreatitis   . PONV (postoperative nausea and vomiting)   . Stroke ([place]) 11/2017   had tia. no longer needs plavix  . Thyroid disease     Patient Active Problem List   Diagnosis Date Noted  . Hyperglycemia 09/23/2018  . S/P ventral herniorrhaphy 03/15/2018  . Ventral hernia without obstruction or gangrene   . Hypertension 03/21/2017  . Diabetes mellitus without complication ([place]) 59/16/3846  . Pancreatitis 03/21/2017  . Poor venous access 03/21/2017  . Vasculitis ([place]) 08/03/2016  . Acute maculopapular rash 04/21/2016  . Maculopapular rash, generalized 04/20/2016  . Unstable angina ([place]) 09/28/2015    Past Surgical History:  Procedure Laterality Date  . ABDOMINAL  HYSTERECTOMY    . APPENDECTOMY    . BLADDER SURGERY     mesh removed. revision which has caused everything to fall again  . BOWEL RESECTION     took a piece of bowel out after pancreatectomy caused problem with bowel.   Marland Kitchen BREAST BIOPSY Left 1989   benign  . CARDIAC CATHETERIZATION  [id number], 1998   no stents, fatty streaks  . CHOLECYSTECTOMY    . COLON SURGERY    . COLONOSCOPY WITH PROPOFOL N/A 10/08/2018   Procedure: COLONOSCOPY WITH PROPOFOL;  Surgeon: Lollie Sails, MD;  Location: [facility] ENDOSCOPY;  Service: Endoscopy;  Laterality: N/A;  . CYST REMOVAL HAND Left 1992   Ganglion cyst removed from Left Wrist  . CYSTECTOMY    . CYSTO WITH HYDRODISTENSION N/A 03/04/2019   Procedure:  CYSTOSCOPY/HYDRODISTENSION;  Surgeon: Bjorn Loser, MD;  Location: [facility] ORS;  Service: Urology;  Laterality: N/A;  . DILATATION & CURETTAGE/HYSTEROSCOPY WITH MYOSURE    . DILATION AND CURETTAGE OF UTERUS    . ERCP    . ESOPHAGOGASTRODUODENOSCOPY    . HERNIA REPAIR    . KNEE ARTHROSCOPY Left 1992  . LEFT HEART CATH AND CORONARY ANGIOGRAPHY Left 02/06/2019   Procedure: LEFT HEART CATH AND CORONARY ANGIOGRAPHY;  Surgeon: Yolonda Kida, MD;  Location: [place] CV LAB;  Service: Cardiovascular;  Laterality: Left;  . LIVER BIOPSY    . PANCREAS SURGERY  2006   partial pancreatectomy after endoscope knicked a part of pancreas.   Marland Kitchen PORTA CATH INSERTION N/A 04/10/2017   Procedure: PORTA CATH INSERTION;  Surgeon: Algernon Huxley, MD;  Location: [place] CV LAB;  Service: Cardiovascular;  Laterality: N/A;  . PORTA CATH INSERTION N/A 02/21/2019   Procedure: PORTA CATH INSERTION;  Surgeon: Algernon Huxley, MD;  Location: [place] CV LAB;  Service: Cardiovascular;  Laterality: N/A;  . REPAIR OF ESOPHAGUS  2012   3 clamps placed in esophagus  . ROBOTIC ASSISTED LAPAROSCOPIC VENTRAL/INCISIONAL HERNIA REPAIR N/A 03/15/2018   Procedure: ROBOTIC ASSISTED LAPAROSCOPIC VENTRAL HERNIA REPAIR;  Surgeon: Jules Husbands, MD;  Location: [facility] ORS;  Service: General;  Laterality: N/A;  . SMALL BOWEL REPAIR    . TONSILLECTOMY      Prior to Admission medications   Medication Sig Start Date End Date Taking? Authorizing Provider  ACIDOPHILUS LACTOBACILLUS PO Take 1 capsule by mouth daily.     [provider]  amLODipine (NORVASC) 5 MG tablet Take 5 mg by mouth daily.    [provider]  Calcium Carb-Cholecalciferol (CALCIUM 600/VITAMIN D3 PO) Take 1 tablet by mouth daily.    [provider]  Cholecalciferol (VITAMIN D-3) 5000 units TABS Take 5,000 Units by mouth daily.    [provider]  cyanocobalamin 2000 MCG tablet Take 2,000 mcg by mouth daily.    [provider]  Dexlansoprazole 30 MG capsule Take 30 mg by mouth daily.    [provider]  dicyclomine (BENTYL) 10 MG capsule Take 20 mg by mouth 4 (four) times daily as needed for spasms (abdominal spasms.).  08/20/18 08/20/19  [provider]  gabapentin (NEURONTIN) 300 MG capsule Take 2 capsules (600 mg total) by mouth 3 (three) times daily. 03/16/18   Tylene Fantasia, PA-C  hydrochlorothiazide (HYDRODIURIL) 12.5 MG tablet Take 1 tablet (12.5 mg total) by mouth daily as needed (for fluid). Patient not taking: Reported on 05/23/2019 09/25/18   Henreitta Leber, MD  Insulin Human (INSULIN PUMP) SOLN Inject into the  skin. NOVOLOG 100 UNIT/ML injection    [provider]  lipase/protease/amylase (CREON) 36000 UNITS CPEP capsule Take 144,000 Units by mouth 3 (three) times daily before meals.     [provider]  lisinopril (ZESTRIL) 40 MG tablet Take 40 mg by mouth daily. 09/03/18   [provider]  Magnesium (V-R MAGNESIUM) 250 MG TABS Take 250 mg by mouth daily.    [provider]  metoprolol succinate (TOPROL-XL) 25 MG 24 hr tablet Take 25 mg by mouth daily.    [provider]  mirabegron ER (MYRBETRIQ) 50 MG TB24 tablet Take 1 tablet (50 mg total) by mouth daily. Patient not taking: Reported on 05/23/2019 03/18/19   Bjorn Loser, MD  NOVOLOG 100 UNIT/ML injection Inject 0-7 Units into the skin See admin instructions. Uses with insulin pump    [provider]  oxybutynin (DITROPAN-XL) 10 MG 24 hr tablet Take 1 tablet (10 mg total) by mouth daily. 05/06/19   Bjorn Loser, MD  solifenacin (VESICARE) 5 MG tablet Take 1 tablet (5 mg total) by mouth daily. 05/06/19   Bjorn Loser, MD  vitamin E 400 UNIT capsule Take 400 Units by mouth daily.    [provider]    Allergies Ciprofloxacin, Demerol [meperidine], Dilaudid [hydromorphone hcl], Metoclopramide, Morphine and related, Morpholine salicylate, Isosorbide  nitrate, Sulfa antibiotics, Flagyl [metronidazole], Adhesive [tape], Darvon [propoxyphene], Flexeril [cyclobenzaprine], Levaquin [levofloxacin in d5w], Naproxen, Soma [carisoprodol], and Zofran [ondansetron hcl]  Family History  Problem Relation Age of Onset  . Cirrhosis Mother   . Colon cancer Mother 56       TAH/BSO in ~73; deceased at 100  . Hypertension Mother   . Breast cancer Mother 60       unconfirmed  . Hypertension Father   . Prostate cancer Father 31       currently 32  . Other Father        liver disease  . Breast cancer Maternal Aunt        age at dx unk.; currently 51  . Cervical cancer Maternal Aunt   . Cancer Maternal Uncle        unk. type; currently 58s  . Stomach cancer Paternal Aunt 3       deceased at 59  . Breast cancer Maternal Grandmother 10       possibly bilateral in 38s; deceased 53  . Non-Hodgkin's lymphoma Daughter        unconfirmed; currently 96  . Cancer Maternal Aunt        hematologic malignancy; deceased 39    Social History Social History   Tobacco Use  . Smoking status: Never Smoker  . Smokeless tobacco: Never Used  Substance Use Topics  . Alcohol use: Yes    Comment: rarely  . Drug use: No    Review of Systems  Constitutional: No fever/chills Eyes: No visual changes. ENT: No sore throat. Cardiovascular: Denies chest pain. Respiratory: Denies shortness of breath. Gastrointestinal: Positive for abdominal pain.  Positive for nausea, no vomiting.  No diarrhea.  No constipation. Genitourinary: Negative for dysuria. Musculoskeletal: Negative for back pain. Skin: Negative for rash. Neurological: Negative for headaches, focal weakness or numbness.  ____________________________________________   PHYSICAL EXAM:  VITAL SIGNS: ED Triage Vitals  Enc Vitals Group     BP 06/07/19 0856 (!) 163/95     Pulse Rate 06/07/19 0856 92     Resp 06/07/19 0856 17     Temp 06/07/19 0858 98.3 F (36.8 C)  Temp Source 06/07/19 0856 Oral      SpO2 06/07/19 0857 98 %     Weight 06/07/19 0857 161 lb (73 kg)     Height 06/07/19 0857 5' 3"  (1.6 m)     Head Circumference --      Peak Flow --      Pain Score 06/07/19 0857 10     Pain Loc --      Pain Edu? --      Excl. in [place]? --     Constitutional: Alert and oriented. Eyes: Conjunctivae are normal. Head: Atraumatic. Nose: No congestion/rhinnorhea. Mouth/Throat: Mucous membranes are moist. Neck: Normal ROM Cardiovascular: Normal rate, regular rhythm. Grossly normal heart sounds.  Right chest wall Mediport. Respiratory: Normal respiratory effort.  No retractions. Lungs CTAB. Gastrointestinal: Soft and tender to palpation in the right upper quadrant with no rebound or guarding. No distention. Genitourinary: deferred Musculoskeletal: No lower extremity tenderness nor edema. Neurologic:  Normal speech and language. No gross focal neurologic deficits are appreciated. Skin:  Skin is warm, dry and intact. No rash noted. Psychiatric: Mood and affect are normal. Speech and behavior are normal.  ____________________________________________   LABS (all labs ordered are listed, but only abnormal results are displayed)  Labs Reviewed  URINALYSIS, COMPLETE (UACMP) WITH MICROSCOPIC - Abnormal; Notable for the following components:      Result Value   Color, Urine STRAW (*)    APPearance CLEAR (*)    Specific Gravity, Urine 1.002 (*)    Glucose, UA >=500 (*)    Bacteria, UA RARE (*)    All other components within normal limits  COMPREHENSIVE METABOLIC PANEL - Abnormal; Notable for the following components:   Potassium 3.4 (*)    Glucose, Bld 341 (*)    Calcium 8.0 (*)    Alkaline Phosphatase 161 (*)    Total Bilirubin 3.3 (*)    All other components within normal limits  CBC WITH DIFFERENTIAL/PLATELET  LIPASE, BLOOD     PROCEDURES  Procedure(s) performed (including Critical Care):  Procedures   ____________________________________________   INITIAL IMPRESSION /  ASSESSMENT AND PLAN / ED COURSE       54 year old female with past medical history of diabetes, chronic pancreatitis, and [name] who presents to the ED complaining of right upper quadrant and right flank pain gradually worsening over the past 2 days.  She states she has had similar flareups of pain in the past which she attributes to her liver or pancreas.  She has been diagnosed with NASH but also states her diagnosis is not clear and she has been referred to [facility] for further evaluation.  Lab work today appears similar to prior with mild elevation in bilirubin and alk phos.  UA with no evidence of infection or bleeding.  CT scan was performed and shows mild colonic wall thickening but no inflammatory changes, likely representing mild colitis.  At this point, risks of antibiotics seems to outweigh the benefits and is possible her symptoms are related to viral illness.  She feels better following dose of fentanyl and Phenergan, and is appropriate for discharge home at this time.  I have counseled her to follow-up with her GI providers and otherwise return to the ED for new or worsening symptoms, patient agrees with plan.      ____________________________________________   FINAL CLINICAL IMPRESSION(S) / ED DIAGNOSES  Final diagnoses:  Colitis  RUQ abdominal pain     ED Discharge Orders    None  Note:  This document was prepared using Dragon voice recognition software and may include unintentional dictation errors.   Blake Divine, MD 06/07/19 1147

## 2019-06-07 NOTE — ED Triage Notes (Addendum)
Pt c/o right flank pain radiating around to the RLQ since Wednesday. Denies vomiting. Pt has a hx of kidney stones

## 2019-06-10 DIAGNOSIS — K861 Other chronic pancreatitis: Secondary | ICD-10-CM | POA: Diagnosis not present

## 2019-06-10 DIAGNOSIS — K582 Mixed irritable bowel syndrome: Secondary | ICD-10-CM | POA: Diagnosis not present

## 2019-06-10 DIAGNOSIS — L989 Disorder of the skin and subcutaneous tissue, unspecified: Secondary | ICD-10-CM | POA: Diagnosis not present

## 2019-06-10 DIAGNOSIS — E1040 Type 1 diabetes mellitus with diabetic neuropathy, unspecified: Secondary | ICD-10-CM | POA: Diagnosis not present

## 2019-06-10 DIAGNOSIS — K219 Gastro-esophageal reflux disease without esophagitis: Secondary | ICD-10-CM | POA: Diagnosis not present

## 2019-06-10 DIAGNOSIS — I10 Essential (primary) hypertension: Secondary | ICD-10-CM | POA: Diagnosis not present

## 2019-06-10 DIAGNOSIS — Z79899 Other long term (current) drug therapy: Secondary | ICD-10-CM | POA: Diagnosis not present

## 2019-06-10 DIAGNOSIS — R1011 Right upper quadrant pain: Secondary | ICD-10-CM | POA: Diagnosis not present

## 2019-06-11 DIAGNOSIS — K582 Mixed irritable bowel syndrome: Secondary | ICD-10-CM | POA: Diagnosis not present

## 2019-06-11 DIAGNOSIS — R109 Unspecified abdominal pain: Secondary | ICD-10-CM | POA: Diagnosis not present

## 2019-06-11 DIAGNOSIS — K529 Noninfective gastroenteritis and colitis, unspecified: Secondary | ICD-10-CM | POA: Diagnosis not present

## 2019-06-11 DIAGNOSIS — K7581 Nonalcoholic steatohepatitis (NASH): Secondary | ICD-10-CM | POA: Diagnosis not present

## 2019-06-24 DIAGNOSIS — R69 Illness, unspecified: Secondary | ICD-10-CM | POA: Diagnosis not present

## 2019-06-24 DIAGNOSIS — K006 Disturbances in tooth eruption: Secondary | ICD-10-CM | POA: Diagnosis not present

## 2019-06-26 DIAGNOSIS — E139 Other specified diabetes mellitus without complications: Secondary | ICD-10-CM | POA: Diagnosis not present

## 2019-06-26 DIAGNOSIS — E1365 Other specified diabetes mellitus with hyperglycemia: Secondary | ICD-10-CM | POA: Diagnosis not present

## 2019-06-26 DIAGNOSIS — Z9641 Presence of insulin pump (external) (internal): Secondary | ICD-10-CM | POA: Diagnosis not present

## 2019-07-02 ENCOUNTER — Ambulatory Visit
Admission: RE | Admit: 2019-07-02 | Discharge: 2019-07-02 | Disposition: A | Discharge status: Home / Self Care | Payer: Medicare HMO | Source: Ambulatory Visit | Attending: Vascular Surgery | Admitting: Vascular Surgery

## 2019-07-02 MED ORDER — SODIUM CHLORIDE 0.9% FLUSH: 10.0000 mL | Freq: Once | INTRAVENOUS | Status: DC

## 2019-07-02 MED ORDER — HEPARIN SOD (PORK) LOCK FLUSH 100 UNIT/ML IV SOLN: 500.0000 [IU] | Freq: Once | INTRAVENOUS | Status: DC

## 2019-07-03 ENCOUNTER — Ambulatory Visit
Admission: RE | Admit: 2019-07-03 | Discharge: 2019-07-03 | Disposition: A | Discharge status: Home / Self Care | Payer: Medicare HMO | Source: Ambulatory Visit | Attending: Vascular Surgery | Admitting: Vascular Surgery

## 2019-07-03 DIAGNOSIS — Z95828 Presence of other vascular implants and grafts: Secondary | ICD-10-CM | POA: Insufficient documentation

## 2019-07-03 MED ORDER — SODIUM CHLORIDE 0.9% FLUSH
10.0000 mL | Freq: Once | INTRAVENOUS | Status: AC
  Administered 2019-07-03: 10 mL via INTRAVENOUS

## 2019-07-03 MED ORDER — HEPARIN SOD (PORK) LOCK FLUSH 100 UNIT/ML IV SOLN: 500.0000 [IU] | INTRAVENOUS | Status: AC | PRN

## 2019-07-03 MED ORDER — HEPARIN SOD (PORK) LOCK FLUSH 100 UNIT/ML IV SOLN
INTRAVENOUS | Status: AC
  Administered 2019-07-03: 500 [IU]
  Filled 2019-07-03: qty 5

## 2019-07-05 DIAGNOSIS — E139 Other specified diabetes mellitus without complications: Secondary | ICD-10-CM | POA: Diagnosis not present

## 2019-07-05 DIAGNOSIS — E109 Type 1 diabetes mellitus without complications: Secondary | ICD-10-CM | POA: Diagnosis not present

## 2019-07-08 ENCOUNTER — Ambulatory Visit: Payer: Self-pay | Admitting: Urology

## 2019-07-08 ENCOUNTER — Encounter: Payer: Self-pay | Admitting: Urology

## 2019-07-26 ENCOUNTER — Emergency Department: Payer: Medicare HMO

## 2019-07-26 ENCOUNTER — Other Ambulatory Visit: Payer: Self-pay

## 2019-07-26 ENCOUNTER — Emergency Department
Admission: EM | Admit: 2019-07-26 | Discharge: 2019-07-26 | Disposition: A | Discharge status: Home / Self Care | Payer: Medicare HMO | Attending: Emergency Medicine | Admitting: Emergency Medicine

## 2019-07-26 ENCOUNTER — Encounter: Payer: Self-pay | Admitting: Emergency Medicine

## 2019-07-26 DIAGNOSIS — Z79899 Other long term (current) drug therapy: Secondary | ICD-10-CM | POA: Diagnosis not present

## 2019-07-26 DIAGNOSIS — R0789 Other chest pain: Secondary | ICD-10-CM | POA: Diagnosis not present

## 2019-07-26 DIAGNOSIS — R21 Rash and other nonspecific skin eruption: Secondary | ICD-10-CM | POA: Diagnosis not present

## 2019-07-26 DIAGNOSIS — E119 Type 2 diabetes mellitus without complications: Secondary | ICD-10-CM | POA: Insufficient documentation

## 2019-07-26 DIAGNOSIS — L299 Pruritus, unspecified: Secondary | ICD-10-CM | POA: Insufficient documentation

## 2019-07-26 DIAGNOSIS — Z794 Long term (current) use of insulin: Secondary | ICD-10-CM | POA: Diagnosis not present

## 2019-07-26 DIAGNOSIS — R1010 Upper abdominal pain, unspecified: Secondary | ICD-10-CM | POA: Insufficient documentation

## 2019-07-26 DIAGNOSIS — J90 Pleural effusion, not elsewhere classified: Secondary | ICD-10-CM | POA: Diagnosis not present

## 2019-07-26 DIAGNOSIS — M79602 Pain in left arm: Secondary | ICD-10-CM | POA: Diagnosis not present

## 2019-07-26 DIAGNOSIS — I10 Essential (primary) hypertension: Secondary | ICD-10-CM | POA: Insufficient documentation

## 2019-07-26 DIAGNOSIS — R079 Chest pain, unspecified: Secondary | ICD-10-CM | POA: Diagnosis not present

## 2019-07-26 DIAGNOSIS — R109 Unspecified abdominal pain: Secondary | ICD-10-CM | POA: Diagnosis not present

## 2019-07-26 LAB — CBC
HCT: 36.5 % (ref 36.0–46.0)
Hemoglobin: 13.1 g/dL (ref 12.0–15.0)
MCH: 29.8 pg (ref 26.0–34.0)
MCHC: 35.9 g/dL (ref 30.0–36.0)
MCV: 83 fL (ref 80.0–100.0)
Platelets: 157 10*3/uL (ref 150–400)
RBC: 4.4 MIL/uL (ref 3.87–5.11)
RDW: 12.6 % (ref 11.5–15.5)
WBC: 5.3 10*3/uL (ref 4.0–10.5)
nRBC: 0 % (ref 0.0–0.2)

## 2019-07-26 LAB — URINALYSIS, COMPLETE (UACMP) WITH MICROSCOPIC
Bilirubin Urine: NEGATIVE
Glucose, UA: >=500 mg/dL — AB
Hgb urine dipstick: NEGATIVE
Ketones, ur: NEGATIVE mg/dL
Leukocytes,Ua: NEGATIVE
Nitrite: NEGATIVE
Protein, ur: NEGATIVE mg/dL
Specific Gravity, Urine: 1.01 (ref 1.005–1.030)
pH: 5 (ref 5.0–8.0)

## 2019-07-26 LAB — BASIC METABOLIC PANEL
Anion gap: 11 (ref 5–15)
BUN: 9 mg/dL (ref 6–20)
CO2: 25 mmol/L (ref 22–32)
Calcium: 7.7 mg/dL — ABNORMAL LOW (ref 8.9–10.3)
Chloride: 100 mmol/L (ref 98–111)
Creatinine, Ser: 0.83 mg/dL (ref 0.44–1.00)
GFR calc Af Amer: >60 mL/min (ref >60)
GFR calc non Af Amer: >60 mL/min (ref >60)
Glucose, Bld: 294 mg/dL — ABNORMAL HIGH (ref 70–99)
Potassium: 2.9 mmol/L — ABNORMAL LOW (ref 3.5–5.1)
Sodium: 136 mmol/L (ref 135–145)

## 2019-07-26 LAB — HEPATIC FUNCTION PANEL
ALT: 32 U/L (ref 0–44)
AST: 22 U/L (ref 15–41)
Albumin: 3.6 g/dL (ref 3.5–5.0)
Alkaline Phosphatase: 128 U/L — ABNORMAL HIGH (ref 38–126)
Bilirubin, Direct: 0.2 mg/dL (ref 0.0–0.2)
Indirect Bilirubin: 3.8 mg/dL — ABNORMAL HIGH (ref 0.3–0.9)
Total Bilirubin: 4 mg/dL — ABNORMAL HIGH (ref 0.3–1.2)
Total Protein: 6.7 g/dL (ref 6.5–8.1)

## 2019-07-26 LAB — TROPONIN I (HIGH SENSITIVITY)
Troponin I (High Sensitivity): 4 ng/L (ref <18)
Troponin I (High Sensitivity): 4 ng/L (ref <18)

## 2019-07-26 LAB — LIPASE, BLOOD: Lipase: 18 U/L (ref 11–51)

## 2019-07-26 MED ORDER — HEPARIN SOD (PORK) LOCK FLUSH 100 UNIT/ML IV SOLN
500.0000 [IU] | Freq: Once | INTRAVENOUS | Status: AC
  Administered 2019-07-26: 500 [IU] via INTRAVENOUS
  Filled 2019-07-26: qty 5

## 2019-07-26 MED ORDER — PANTOPRAZOLE SODIUM 40 MG PO TBEC
40.0000 mg | DELAYED_RELEASE_TABLET | Freq: Every day | ORAL | 1 refills | Status: DC
Start: 2019-07-26 — End: 2020-09-02

## 2019-07-26 MED ORDER — IOHEXOL 300 MG/ML  SOLN
100.0000 mL | Freq: Once | INTRAMUSCULAR | Status: AC | PRN
  Administered 2019-07-26: 100 mL via INTRAVENOUS

## 2019-07-26 MED ORDER — HEPARIN SOD (PORK) LOCK FLUSH 10 UNIT/ML IV SOLN
10.0000 [IU] | Freq: Once | INTRAVENOUS | Status: DC
  Filled 2019-07-26: qty 1

## 2019-07-26 NOTE — ED Notes (Addendum)
Called CT to notify pt done with contrast and that port is accessed. Pt removing her insulin pump for CT.

## 2019-07-26 NOTE — ED Provider Notes (Addendum)
[facility] Emergency Department Provider Note   ____________________________________________   First MD Initiated Contact with Patient 07/26/19 1750     (approximate)  I have reviewed the triage vital signs and the nursing notes.   HISTORY  Chief Complaint Abdominal Pain    HPI Amber Christensen is a 54 y.o. female who reports abdominal pain last couple days.  She has been having intermittent episodes of abdominal pain was supposed to follow-up with GI but says they have not called back.  Today the pain seemed to go into her left chest and left arm.  She says she gets this kind of pain when she gets an episode of pancreatitis.  Also goes along with a red rash she has on her chest.  The rash is not itchy diffusely blotchy red almost an wide necklace pattern around her upper chest and back.  Pain is intermittently sharp constantly there all in between sharp episodes almost like cramps.  Pain is severe at times currently not severe patient.  Patient has had this kind of pain before.         Past Medical History:  Diagnosis Date  . Allergy    many many allergies  . Anemia    in past  . Anginal pain ([place]) 01/2018   cardiac workup clear  . Anxiety   . 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 Bipolar disorder ([place])   . Bladder mass    removed and had tbt. mesh removed  . Blood transfusion without reported diagnosis 1999   received 20 units of blood after tearing esophagus from vomitting so severely after ERCP  . Breast mass    left side biopsy was negative  . Carpal tunnel syndrome    left hand  . Cervical cancer ([place]) 2002  . Chronic kidney disease    cyst on left kidney  . Chronic pancreatitis ([place])    prior to pancreas surgery  . Cirrhosis ([place])   . Clotting disorder ([place])    pt states that sometimes she has difficulty stopping to bleed and other times it is okay  . Depression   . Diabetes mellitus without  complication (HCC)    has insulin pump  . Dyspnea   . Esophagitis   . GERD (gastroesophageal reflux disease)   . Headache    migraines...takes compazine for this  . Heart disease   . 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 Hypertension   . Irritable bowel syndrome   . Kidney mass    just watching. left kidney mass.  . Liver disease    NASH. has had liver biopsies. negative except for NASH  . NASH (nonalcoholic steatohepatitis)   . NASH (nonalcoholic steatohepatitis)   . Pancreatitis   . PONV (postoperative nausea and vomiting)   . Stroke ([place]) 11/2017   had tia. no longer needs plavix  . Thyroid disease     Patient Active Problem List   Diagnosis Date Noted  . Hyperglycemia 09/23/2018  . S/P ventral herniorrhaphy 03/15/2018  . Ventral hernia without obstruction or gangrene   . Hypertension 03/21/2017  . Diabetes mellitus without complication ([place]) 27/74/1287  . Pancreatitis 03/21/2017  . Poor venous access 03/21/2017  . Vasculitis ([place]) 08/03/2016  . Acute maculopapular rash 04/21/2016  . Maculopapular rash, generalized 04/20/2016  . Unstable angina ([place]) 09/28/2015    Past  Surgical History:  Procedure Laterality Date  . ABDOMINAL HYSTERECTOMY    . APPENDECTOMY    . BLADDER SURGERY     mesh removed. revision which has caused everything to fall again  . BOWEL RESECTION     took a piece of bowel out after pancreatectomy caused problem with bowel.   Marland Kitchen BREAST BIOPSY Left 1989   benign  . CARDIAC CATHETERIZATION  [id number], 1998   no stents, fatty streaks  . CHOLECYSTECTOMY    . COLON SURGERY    . COLONOSCOPY WITH PROPOFOL N/A 10/08/2018   Procedure: COLONOSCOPY WITH PROPOFOL;  Surgeon: Lollie Sails, MD;  Location: [facility] ENDOSCOPY;  Service: Endoscopy;  Laterality: N/A;  . CYST REMOVAL HAND Left 1992   Ganglion cyst removed from Left Wrist  . CYSTECTOMY    . CYSTO WITH HYDRODISTENSION N/A  03/04/2019   Procedure: CYSTOSCOPY/HYDRODISTENSION;  Surgeon: Bjorn Loser, MD;  Location: [facility] ORS;  Service: Urology;  Laterality: N/A;  . DILATATION & CURETTAGE/HYSTEROSCOPY WITH MYOSURE    . DILATION AND CURETTAGE OF UTERUS    . ERCP    . ESOPHAGOGASTRODUODENOSCOPY    . HERNIA REPAIR    . KNEE ARTHROSCOPY Left 1992  . LEFT HEART CATH AND CORONARY ANGIOGRAPHY Left 02/06/2019   Procedure: LEFT HEART CATH AND CORONARY ANGIOGRAPHY;  Surgeon: Yolonda Kida, MD;  Location: [place] CV LAB;  Service: Cardiovascular;  Laterality: Left;  . LIVER BIOPSY    . PANCREAS SURGERY  2006   partial pancreatectomy after endoscope knicked a part of pancreas.   Marland Kitchen PORTA CATH INSERTION N/A 04/10/2017   Procedure: PORTA CATH INSERTION;  Surgeon: Algernon Huxley, MD;  Location: [place] CV LAB;  Service: Cardiovascular;  Laterality: N/A;  . PORTA CATH INSERTION N/A 02/21/2019   Procedure: PORTA CATH INSERTION;  Surgeon: Algernon Huxley, MD;  Location: [place] CV LAB;  Service: Cardiovascular;  Laterality: N/A;  . REPAIR OF ESOPHAGUS  2012   3 clamps placed in esophagus  . ROBOTIC ASSISTED LAPAROSCOPIC VENTRAL/INCISIONAL HERNIA REPAIR N/A 03/15/2018   Procedure: ROBOTIC ASSISTED LAPAROSCOPIC VENTRAL HERNIA REPAIR;  Surgeon: Jules Husbands, MD;  Location: [facility] ORS;  Service: General;  Laterality: N/A;  . SMALL BOWEL REPAIR    . TONSILLECTOMY      Prior to Admission medications   Medication Sig Start Date End Date Taking? Authorizing Provider  ACIDOPHILUS LACTOBACILLUS PO Take 1 capsule by mouth daily.     [provider]  amLODipine (NORVASC) 5 MG tablet Take 5 mg by mouth daily.    [provider]  Calcium Carb-Cholecalciferol (CALCIUM 600/VITAMIN D3 PO) Take 1 tablet by mouth daily.    [provider]  Cholecalciferol (VITAMIN D-3) 5000 units TABS Take 5,000 Units by mouth daily.    [provider]  cyanocobalamin 2000 MCG tablet Take 2,000 mcg by mouth  daily.    [provider]  Dexlansoprazole 30 MG capsule Take 30 mg by mouth daily.    [provider]  dicyclomine (BENTYL) 10 MG capsule Take 20 mg by mouth 4 (four) times daily as needed for spasms (abdominal spasms.).  08/20/18 08/20/19  [provider]  gabapentin (NEURONTIN) 300 MG capsule Take 2 capsules (600 mg total) by mouth 3 (three) times daily. 03/16/18   Tylene Fantasia, PA-C  hydrochlorothiazide (HYDRODIURIL) 12.5 MG tablet Take 1 tablet (12.5 mg total) by mouth daily as needed (for fluid). Patient not taking: Reported on 05/23/2019 09/25/18   Henreitta Leber, MD  Insulin Human (INSULIN PUMP) SOLN Inject into the skin. NOVOLOG 100 UNIT/ML injection    [provider]  lipase/protease/amylase (CREON) 36000 UNITS CPEP capsule Take 144,000 Units by mouth 3 (three) times daily before meals.     [provider]  lisinopril (ZESTRIL) 40 MG tablet Take 40 mg by mouth daily. 09/03/18   [provider]  Magnesium (V-R MAGNESIUM) 250 MG TABS Take 250 mg by mouth daily.    [provider]  metoprolol succinate (TOPROL-XL) 25 MG 24 hr tablet Take 25 mg by mouth daily.    [provider]  mirabegron ER (MYRBETRIQ) 50 MG TB24 tablet Take 1 tablet (50 mg total) by mouth daily. Patient not taking: Reported on 05/23/2019 03/18/19   Bjorn Loser, MD  NOVOLOG 100 UNIT/ML injection Inject 0-7 Units into the skin See admin instructions. Uses with insulin pump    [provider]  oxybutynin (DITROPAN-XL) 10 MG 24 hr tablet Take 1 tablet (10 mg total) by mouth daily. 05/06/19   Bjorn Loser, MD  solifenacin (VESICARE) 5 MG tablet Take 1 tablet (5 mg total) by mouth daily. 05/06/19   Bjorn Loser, MD  vitamin E 400 UNIT capsule Take 400 Units by mouth daily.    [provider]    Allergies Ciprofloxacin, Demerol [meperidine], Dilaudid [hydromorphone hcl], Metoclopramide, Morphine and related, Morpholine  salicylate, Isosorbide nitrate, Sulfa antibiotics, Flagyl [metronidazole], Adhesive [tape], Darvon [propoxyphene], Flexeril [cyclobenzaprine], Levaquin [levofloxacin in d5w], Naproxen, Soma [carisoprodol], and Zofran [ondansetron hcl]  Family History  Problem Relation Age of Onset  . Cirrhosis Mother   . Colon cancer Mother 52       TAH/BSO in ~41; deceased at 52  . Hypertension Mother   . Breast cancer Mother 80       unconfirmed  . Hypertension Father   . Prostate cancer Father 66       currently 34  . Other Father        liver disease  . Breast cancer Maternal Aunt        age at dx unk.; currently 76  . Cervical cancer Maternal Aunt   . Cancer Maternal Uncle        unk. type; currently 73s  . Stomach cancer Paternal Aunt 62       deceased at 78  . Breast cancer Maternal Grandmother 65       possibly bilateral in 68s; deceased 36  . Non-Hodgkin's lymphoma Daughter        unconfirmed; currently 6  . Cancer Maternal Aunt        hematologic malignancy; deceased 36    Social History Social History   Tobacco Use  . Smoking status: Never Smoker  . Smokeless tobacco: Never Used  Vaping Use  . Vaping Use: Never used  Substance Use Topics  . Alcohol use: Yes    Comment: rarely  . Drug use: No    Review of Systems  Constitutional: No fever/chills Eyes: No visual changes. ENT: No sore throat. Cardiovascular: Some chest pain. Respiratory: Denies shortness of breath. Gastrointestinal: abdominal pain.  No nausea, no vomiting.  Genitourinary: Negative for dysuria. Musculoskeletal: Sometimes the pain radiates to the back. Skin: Negative for rash. Neurological: Negative for headaches, focal weakness   ____________________________________________   PHYSICAL EXAM:  VITAL SIGNS: ED Triage Vitals  Enc Vitals Group     BP 07/26/19 1330 (!) 163/89     Pulse Rate 07/26/19 1330 100     Resp 07/26/19 1330 20  Temp 07/26/19 1330 99.7 F (37.6 C)     Temp Source  07/26/19 1330 Oral     SpO2 07/26/19 1330 97 %     Weight 07/26/19 1327 160 lb (72.6 kg)     Height 07/26/19 1327 5' 4"  (1.626 m)     Head Circumference --      Peak Flow --      Pain Score 07/26/19 1327 6     Pain Loc --      Pain Edu? --      Excl. in [place]? --     Constitutional: Alert and oriented. Well appearing and in no acute distress. Eyes: Conjunctivae are normal.  Head: Atraumatic. Nose: No congestion/rhinnorhea. Mouth/Throat: Mucous membranes are moist.  Oropharynx non-erythematous. Neck: No stridor Cardiovascular: Normal rate, regular rhythm. Grossly normal heart sounds.  Good peripheral circulation. Respiratory: Normal respiratory effort.  No retractions. Lungs CTAB. Gastrointestinal: Soft tender to palpation across the mid and upper abdomen no distention. No abdominal bruits.  Musculoskeletal: No lower extremity tenderness nor edema.   Neurologic:  Normal speech and language. No gross focal neurologic deficits are appreciated.  Skin:  Skin is warm, dry and intact. No rash noted.   ____________________________________________   LABS (all labs ordered are listed, but only abnormal results are displayed)  Labs Reviewed  BASIC METABOLIC PANEL - Abnormal; Notable for the following components:      Result Value   Potassium 2.9 (*)    Glucose, Bld 294 (*)    Calcium 7.7 (*)    All other components within normal limits  URINALYSIS, COMPLETE (UACMP) WITH MICROSCOPIC - Abnormal; Notable for the following components:   Color, Urine YELLOW (*)    APPearance HAZY (*)    Glucose, UA >=500 (*)    Bacteria, UA RARE (*)    All other components within normal limits  HEPATIC FUNCTION PANEL - Abnormal; Notable for the following components:   Alkaline Phosphatase 128 (*)    Total Bilirubin 4.0 (*)    Indirect Bilirubin 3.8 (*)    All other components within normal limits  CBC  LIPASE, BLOOD  TROPONIN I (HIGH SENSITIVITY)  TROPONIN I (HIGH SENSITIVITY)    ____________________________________________  EKG   ____________________________________________  RADIOLOGY  ED MD interpretation: Chest x-ray read by radiology reviewed by me shows no acute disease.  CT of the abdomen shows some stool present and by radiology report moderate perivesicular fat stranding consistent with cystitis.  The patient's UA is negative and she does not have a white count or lower abdominal tenderness.  Tenderness is mostly epigastric and left-sided.  Official radiology report(s): DG Chest 2 View  Result Date: 07/26/2019 CLINICAL DATA:  Onset left chest pain radiating into the left arm today. EXAM: CHEST - 2 VIEW COMPARISON:  PA and lateral chest 08/17/2018. FINDINGS: Lungs clear. Heart size normal. Port-A-Cath is unchanged. No pneumothorax or pleural effusion. No acute or focal bony abnormality. IMPRESSION: Negative chest. Electronically Signed   By: Inge Rise M.D.   On: 07/26/2019 14:35   CT ABDOMEN PELVIS W CONTRAST  Result Date: 07/26/2019 CLINICAL DATA:  Acute generalized abdominal pain with neutropenia. EXAM: CT ABDOMEN AND PELVIS WITH CONTRAST TECHNIQUE: Multidetector CT imaging of the abdomen and pelvis was performed using the standard protocol following bolus administration of intravenous contrast. CONTRAST:  163m OMNIPAQUE IOHEXOL 300 MG/ML  SOLN COMPARISON:  CT 06/07/2019, additional priors. FINDINGS: Lower chest: Lung bases are clear. Hepatobiliary: Diffusely decreased hepatic density consistent with steatosis. Areas of  focal fatty sparing adjacent to the porta hepatis again seen. Tiny subcentimeter hypodensity in the posterior right lobe, too small to accurately characterize. Clips in the gallbladder fossa postcholecystectomy. No biliary dilatation. Pancreas: Pancreatic head and uncinate process are unremarkable. More distal pancreas is not visualized, may be surgically absent or atrophic. No peripancreatic fat stranding. Spleen: Normal in size without  focal abnormality. Adrenals/Urinary Tract: Normal adrenal glands. No hydronephrosis or perinephric edema. Homogeneous renal enhancement with symmetric excretion on delayed phase imaging. Simple cyst in the left kidney again seen. Urinary bladder is physiologically distended. Mild perivesicular fat stranding. Stomach/Bowel: Stomach is unremarkable. Normal positioning of the duodenum and ligament of Treitz. No small bowel obstruction or inflammation, administered enteric contrast reaches the colon. Appendix not visualized, chain sutures at the base of the cecum suggest prior appendectomy moderate volume of stool throughout the colon. There is colonic tortuosity. No colonic wall thickening or inflammation. No abnormal rectal distention. Vascular/Lymphatic: Mild aortic atherosclerosis. No aortic aneurysm. The portal vein is patent. No enlarged lymph nodes in the abdomen or pelvis. Reproductive: Uterus not seen presumably surgically absent. No adnexal mass. Other: No free air, free fluid, or intra-abdominal fluid collection. Postsurgical change of the anterior abdominal wall. No body wall hernia. Musculoskeletal: There are no acute or suspicious osseous abnormalities. IMPRESSION: 1. Mild perivesicular fat stranding, can be seen with cystitis. 2. Moderate colonic stool burden with colonic tortuosity, suggesting constipation. No bowel obstruction or inflammation. 3. Hepatic steatosis. Aortic Atherosclerosis (ICD10-I70.0). Electronically Signed   By: Keith Rake M.D.   On: 07/26/2019 19:56    ____________________________________________   PROCEDURES  Procedure(s) performed (including Critical Care):  Procedures   ____________________________________________   INITIAL IMPRESSION / ASSESSMENT AND PLAN / ED COURSE  CT shows some perivesicular stranding is I mentioned above.  There is no apparent reason for this.  I will have patient follow-up with GI as planned.  Should also add patient has had a  chronically elevated bilirubin at least since last year.  We can investigate this further.  I will have the patient return for worse pain fever vomiting or any other symptoms.  I do not see anything that needs hospitalization at this point.  I will give her Protonix and see if that helps.    Patient's rash is improved.  I suggested that she try some Benadryl cream to it and see if that helps.  She will return if that gets worse as well.         ____________________________________________   FINAL CLINICAL IMPRESSION(S) / ED DIAGNOSES  Final diagnoses:  Pain of upper abdomen     ED Discharge Orders    None       Note:  This document was prepared using Dragon voice recognition software and may include unintentional dictation errors.    Nena Polio, MD 07/26/19 2029    Nena Polio, MD 07/26/19 2036

## 2019-07-26 NOTE — ED Notes (Signed)
Port deaccessed with heparin.

## 2019-07-26 NOTE — ED Notes (Signed)
Pt up to use toilet. Pt was able to walk & provide peri care with stand by assistance. Pt is back in bed at this time with no complaints.

## 2019-07-26 NOTE — Discharge Instructions (Addendum)
Please return for worse pain fever or vomiting.  Please follow-up with GI.  I have included the GI doctors contact information above.  You can try using Metamucil over-the-counter to see if that helps with the stool that you have in your colon.  There is a fair amount of it.  Also try the Protonix 1 pill a day to see if that helps with the epigastric pain.

## 2019-07-26 NOTE — ED Notes (Addendum)
Crissy RN and this RN at bedside to access port. Pink band applied to pt's R arm since port on that side. BP cuff on L arm.

## 2019-07-26 NOTE — ED Notes (Signed)
Pt states cannot get IV access usually so requesting that her port be used. Will have 2nd RN assist in accessing port.

## 2019-07-26 NOTE — ED Notes (Signed)
Pt briefly updated. Pt sitting calmly in bed. Bed locked low. Rails up. Call bell within reach.

## 2019-07-26 NOTE — ED Triage Notes (Signed)
Patient reports developing abd pain yesterday. States today she developed pain to left chest that radiates into left arm. Patient also has red rash present to chest.

## 2019-07-26 NOTE — ED Notes (Signed)
Pt unplugged to get up to bedside toilet. Reports long history of GI issues. States recently a "rash" showed up on her chest. Chest is currently red. When asked, states has also noticed recent inc in frequency of BMs and urination. C/o fever. Denies SOB, difficulty swallowing, major tenderness in abdomen. Skin dry. Pt ambulatory to bedside toilet; steady. Resp reg/unlabored. Crissy RN to help access port soon. Pt aware and agreeable.

## 2019-07-26 NOTE — ED Notes (Signed)
2nd RN Crissy to assist this RN with accessing port soon.

## 2019-07-29 ENCOUNTER — Ambulatory Visit (INDEPENDENT_AMBULATORY_CARE_PROVIDER_SITE_OTHER): Payer: Medicare HMO | Admitting: Urology

## 2019-07-29 ENCOUNTER — Encounter: Payer: Self-pay | Admitting: Urology

## 2019-07-29 ENCOUNTER — Other Ambulatory Visit: Payer: Self-pay

## 2019-07-29 VITALS — BP 171/111 | HR 96 | Wt 160.0 lb

## 2019-07-29 DIAGNOSIS — R32 Unspecified urinary incontinence: Secondary | ICD-10-CM | POA: Diagnosis not present

## 2019-07-29 DIAGNOSIS — N3020 Other chronic cystitis without hematuria: Secondary | ICD-10-CM | POA: Diagnosis not present

## 2019-07-29 LAB — MICROSCOPIC EXAMINATION: RBC, Urine: NONE SEEN /hpf (ref 0–2)

## 2019-07-29 LAB — URINALYSIS, COMPLETE
Bilirubin, UA: NEGATIVE
Glucose, UA: NEGATIVE
Ketones, UA: NEGATIVE
Leukocytes,UA: NEGATIVE
Nitrite, UA: NEGATIVE
Protein,UA: NEGATIVE
RBC, UA: NEGATIVE
Specific Gravity, UA: >1.03 — ABNORMAL HIGH (ref 1.005–1.030)
Urobilinogen, Ur: 0.2 mg/dL (ref 0.2–1.0)
pH, UA: 5.5 (ref 5.0–7.5)

## 2019-07-29 MED ORDER — LIDOCAINE HCL 1 % IJ SOLN
11.0000 mL | Freq: Once | INTRAMUSCULAR | Status: AC
  Administered 2019-07-29: 11 mL

## 2019-07-29 NOTE — Progress Notes (Signed)
07/29/2019 10:26 AM   Amber Christensen 1965/05/16 [id number]  Referring provider: [name], MD [address] [facility] [place],  [place] [address]  No chief complaint on file.   HPI: I was consulted to assist the patient's chronic left lower quadrant pain. She is gone to the emergency room with this. The pain may lessen some during voiding but she was nonspecific. She also states she gets vaginal burning when she voids. This is been worse in the last year. Cannot have intercourse due to pelvic pain   She can void every 30 minutes due to urgency and sometimes pressure. She cannot hold it for 2 hours. She gets up 5-6 times a night. Sometimes she has urge incontinence. She leaks with coughing sneezing but not bending lifting. She has moderately severe bedwetting. She wears 2 or 3 shields per day that are moderately wet   She reports a history of pelvic abscess. It appears she had a mesh sling or mesh surgery and has had mesh removed possibly from her bladder and or bowel injury.  She had a CT scan in October which showed mild dilation of the right renal pelvis and right upper ureter slightly larger than the left side that was also prominent.   Patient had positive hydrodistention January 2020 but the findings were very mild and almost within normal limits with a few petechiae around each ureteral orifice.  She had no mesh issues involving the vagina.  The area in question in the vagina to the right side of the introitus posteriorly was reasonably noted her in the office to examine  Patient understands that I am not convinced she has interstitial cystitis though I gave her an IC diet.  The vaginal burning and redness she will continue to see her gynecologist and we talked about vaginitis.  She understands that there was no mesh issues.  The urgency incontinence and frequency is bothering her the most as well.   I will start the patient on Myrbetriq 50 mg samples and  prescription with interstitial cystitis diet.  I did not bring up [place] at this stage.  I did not bring up installations.  We talked with the IC diet  Unfortunately her daughter continues to have difficulty with her health in [place]  Today He comes in stable Myrbetriq helped her urgency and frequency.  She said we called in something that was not covered.  Her urgency incontinence persisting.  She went to the emergency room again with left lower quadrant discomfort.  She had a CT scan which showed normal kidneys and a little bit of stranding around the bladder that was nonspecific  Urine today sent for culture   PMH: Past Medical History:  Diagnosis Date  . Allergy    many many allergies  . Anemia    in past  . Anginal pain ([place]) 01/2018   cardiac workup clear  . Anxiety   . 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[place])    pt unaware of this diagnosis  . Bipolar disorder ([place])   . Bladder mass    removed and had tbt. mesh removed  . Blood transfusion without reported diagnosis 1999   received 20 units of blood after tearing esophagus from vomitting so severely after ERCP  . Breast mass    left side biopsy was negative  . Carpal tunnel syndrome    left hand  . Cervical cancer ([place]) 2002  . Chronic kidney disease    cyst on left kidney  .  Chronic pancreatitis ([place])    prior to pancreas surgery  . Cirrhosis ([place])   . Clotting disorder ([place])    pt states that sometimes she has difficulty stopping to bleed and other times it is okay  . Depression   . Diabetes mellitus without complication (HCC)    has insulin pump  . Dyspnea   . Esophagitis   . GERD (gastroesophageal reflux disease)   . Headache    migraines...takes compazine for this  . Heart disease   . 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 Hypertension   . Irritable bowel syndrome   . Kidney mass    just watching. left kidney mass.  . Liver disease     NASH. has had liver biopsies. negative except for NASH  . NASH (nonalcoholic steatohepatitis)   . NASH (nonalcoholic steatohepatitis)   . Pancreatitis   . PONV (postoperative nausea and vomiting)   . Stroke ([place]) 11/2017   had tia. no longer needs plavix  . Thyroid disease     Surgical History: Past Surgical History:  Procedure Laterality Date  . ABDOMINAL HYSTERECTOMY    . APPENDECTOMY    . BLADDER SURGERY     mesh removed. revision which has caused everything to fall again  . BOWEL RESECTION     took a piece of bowel out after pancreatectomy caused problem with bowel.   Marland Kitchen BREAST BIOPSY Left 1989   benign  . CARDIAC CATHETERIZATION  5027,7412, 1998   no stents, fatty streaks  . CHOLECYSTECTOMY    . COLON SURGERY    . COLONOSCOPY WITH PROPOFOL N/A 10/08/2018   Procedure: COLONOSCOPY WITH PROPOFOL;  Surgeon: Lollie Sails, MD;  Location: [facility] ENDOSCOPY;  Service: Endoscopy;  Laterality: N/A;  . CYST REMOVAL HAND Left 1992   Ganglion cyst removed from Left Wrist  . CYSTECTOMY    . CYSTO WITH HYDRODISTENSION N/A 03/04/2019   Procedure: CYSTOSCOPY/HYDRODISTENSION;  Surgeon: Bjorn Loser, MD;  Location: [facility] ORS;  Service: Urology;  Laterality: N/A;  . DILATATION & CURETTAGE/HYSTEROSCOPY WITH MYOSURE    . DILATION AND CURETTAGE OF UTERUS    . ERCP    . ESOPHAGOGASTRODUODENOSCOPY    . HERNIA REPAIR    . KNEE ARTHROSCOPY Left 1992  . LEFT HEART CATH AND CORONARY ANGIOGRAPHY Left 02/06/2019   Procedure: LEFT HEART CATH AND CORONARY ANGIOGRAPHY;  Surgeon: Yolonda Kida, MD;  Location: [place] CV LAB;  Service: Cardiovascular;  Laterality: Left;  . LIVER BIOPSY    . PANCREAS SURGERY  2006   partial pancreatectomy after endoscope knicked a part of pancreas.   Marland Kitchen PORTA CATH INSERTION N/A 04/10/2017   Procedure: PORTA CATH INSERTION;  Surgeon: Amber Huxley, MD;  Location: [place] CV LAB;  Service: Cardiovascular;  Laterality: N/A;  . PORTA CATH INSERTION N/A  02/21/2019   Procedure: PORTA CATH INSERTION;  Surgeon: Amber Huxley, MD;  Location: [place] CV LAB;  Service: Cardiovascular;  Laterality: N/A;  . REPAIR OF ESOPHAGUS  2012   3 clamps placed in esophagus  . ROBOTIC ASSISTED LAPAROSCOPIC VENTRAL/INCISIONAL HERNIA REPAIR N/A 03/15/2018   Procedure: ROBOTIC ASSISTED LAPAROSCOPIC VENTRAL HERNIA REPAIR;  Surgeon: Jules Husbands, MD;  Location: [facility] ORS;  Service: General;  Laterality: N/A;  . SMALL BOWEL REPAIR    . TONSILLECTOMY      Home Medications:  Allergies as of 07/29/2019      Reactions  Ciprofloxacin    Rash, nausea vomitting   Demerol [meperidine] Hives, Other (See Comments)   Pt states that this medication causes cardiac arrest.     Dilaudid [hydromorphone Hcl] Nausea And Vomiting, Other (See Comments)   Pt states that this medication causes cardiac arrest.     Metoclopramide Hives   Breathing issues   Morphine And Related Anaphylaxis, Other (See Comments)   Pt states that this medication causes cardiac arrest   Morpholine Salicylate Nausea And Vomiting, Other (See Comments), Rash   CHF   Isosorbide Nitrate Other (See Comments)   Headache   Sulfa Antibiotics Nausea And Vomiting   Flagyl [metronidazole]    Rash, heart racing, nausea vomitting   Adhesive [tape] Rash   Blisters skin Paper tape is okay   Darvon [propoxyphene] Nausea And Vomiting, Rash   Flexeril [cyclobenzaprine] Nausea And Vomiting, Rash   Levaquin [levofloxacin In D5w] Nausea And Vomiting, Rash   Naproxen Rash   Ibuprofen and advil are not a problem   Soma [carisoprodol] Nausea And Vomiting, Rash   Zofran [ondansetron Hcl] Nausea And Vomiting, Rash      Medication List       Accurate as of July 29, 2019 10:26 AM. If you have any questions, ask your nurse or doctor.        ACIDOPHILUS LACTOBACILLUS PO Take 1 capsule by mouth daily.   amLODipine 5 MG tablet Commonly known as: NORVASC Take 5 mg by mouth daily.   CALCIUM 600/VITAMIN D3  PO Take 1 tablet by mouth daily.   Creon 36000 UNITS Cpep capsule Generic drug: lipase/protease/amylase Take 144,000 Units by mouth 3 (three) times daily before meals.   cyanocobalamin 2000 MCG tablet Take 2,000 mcg by mouth daily.   Dexlansoprazole 30 MG capsule Take 30 mg by mouth daily.   dicyclomine 10 MG capsule Commonly known as: BENTYL Take 20 mg by mouth 4 (four) times daily as needed for spasms (abdominal spasms.).   gabapentin 300 MG capsule Commonly known as: NEURONTIN Take 2 capsules (600 mg total) by mouth 3 (three) times daily.   hydrochlorothiazide 12.5 MG tablet Commonly known as: HYDRODIURIL Take 1 tablet (12.5 mg total) by mouth daily as needed (for fluid).   insulin pump Soln Inject into the skin. NOVOLOG 100 UNIT/ML injection   lisinopril 40 MG tablet Commonly known as: ZESTRIL Take 40 mg by mouth daily.   metoprolol succinate 25 MG 24 hr tablet Commonly known as: TOPROL-XL Take 25 mg by mouth daily.   mirabegron ER 50 MG Tb24 tablet Commonly known as: MYRBETRIQ Take 1 tablet (50 mg total) by mouth daily.   NovoLOG 100 UNIT/ML injection Generic drug: insulin aspart Inject 0-7 Units into the skin See admin instructions. Uses with insulin pump   oxybutynin 10 MG 24 hr tablet Commonly known as: DITROPAN-XL Take 1 tablet (10 mg total) by mouth daily.   pantoprazole 40 MG tablet Commonly known as: Protonix Take 1 tablet (40 mg total) by mouth daily.   solifenacin 5 MG tablet Commonly known as: VESICARE Take 1 tablet (5 mg total) by mouth daily.   V-R MAGNESIUM 250 MG Tabs Generic drug: Magnesium Take 250 mg by mouth daily.   Vitamin D-3 125 MCG (5000 UT) Tabs Take 5,000 Units by mouth daily.   vitamin E 180 MG (400 UNITS) capsule Take 400 Units by mouth daily.       Allergies:  Allergies  Allergen Reactions  . Ciprofloxacin     Rash, nausea vomitting  .  Demerol [Meperidine] Hives and Other (See Comments)    Pt states that this  medication causes cardiac arrest.    . Dilaudid [Hydromorphone Hcl] Nausea And Vomiting and Other (See Comments)    Pt states that this medication causes cardiac arrest.    . Metoclopramide Hives    Breathing issues  . Morphine And Related Anaphylaxis and Other (See Comments)    Pt states that this medication causes cardiac arrest  . Morpholine Salicylate Nausea And Vomiting, Other (See Comments) and Rash    CHF  . Isosorbide Nitrate Other (See Comments)    Headache   . Sulfa Antibiotics Nausea And Vomiting  . Flagyl [Metronidazole]     Rash, heart racing, nausea vomitting  . Adhesive [Tape] Rash    Blisters skin Paper tape is okay  . Darvon [Propoxyphene] Nausea And Vomiting and Rash  . Flexeril [Cyclobenzaprine] Nausea And Vomiting and Rash  . Levaquin [Levofloxacin In D5w] Nausea And Vomiting and Rash  . Naproxen Rash    Ibuprofen and advil are not a problem  . Soma [Carisoprodol] Nausea And Vomiting and Rash  . Zofran [Ondansetron Hcl] Nausea And Vomiting and Rash    Family History: Family History  Problem Relation Age of Onset  . Cirrhosis Mother   . Colon cancer Mother 65       TAH/BSO in ~67; deceased at 63  . Hypertension Mother   . Breast cancer Mother 75       unconfirmed  . Hypertension Father   . Prostate cancer Father 36       currently 45  . Other Father        liver disease  . Breast cancer Maternal Aunt        age at dx unk.; currently 65  . Cervical cancer Maternal Aunt   . Cancer Maternal Uncle        unk. type; currently 77s  . Stomach cancer Paternal Aunt 58       deceased at 26  . Breast cancer Maternal Grandmother 10       possibly bilateral in 32s; deceased 58  . Non-Hodgkin's lymphoma Daughter        unconfirmed; currently 20  . Cancer Maternal Aunt        hematologic malignancy; deceased 75    Social History:  reports that she has never smoked. She has never used smokeless tobacco. She reports current alcohol use. She reports that she  does not use drugs.  ROS:                                        Physical Exam: There were no vitals taken for this visit.  Constitutional:  Alert and oriented, No acute distress.   Laboratory Data: Lab Results  Component Value Date   WBC 5.3 07/26/2019   HGB 13.1 07/26/2019   HCT 36.5 07/26/2019   MCV 83.0 07/26/2019   PLT 157 07/26/2019    Lab Results  Component Value Date   CREATININE 0.83 07/26/2019    No results found for: PSA  No results found for: TESTOSTERONE  Lab Results  Component Value Date   HGBA1C 9.4 (H) 09/23/2018    Urinalysis    Component Value Date/Time   COLORURINE YELLOW (A) 07/26/2019 1342   APPEARANCEUR HAZY (A) 07/26/2019 1342   APPEARANCEUR Clear 02/18/2019 0910   LABSPEC 1.010 07/26/2019 1342  LABSPEC 1.018 01/31/2014 1455   PHURINE 5.0 07/26/2019 1342   GLUCOSEU >=500 (A) 07/26/2019 1342   GLUCOSEU >=500 01/31/2014 1455   HGBUR NEGATIVE 07/26/2019 1342   BILIRUBINUR NEGATIVE 07/26/2019 1342   BILIRUBINUR Negative 02/18/2019 0910   BILIRUBINUR Negative 01/31/2014 1455   KETONESUR NEGATIVE 07/26/2019 1342   PROTEINUR NEGATIVE 07/26/2019 1342   NITRITE NEGATIVE 07/26/2019 1342   LEUKOCYTESUR NEGATIVE 07/26/2019 1342   LEUKOCYTESUR Negative 01/31/2014 1455    Pertinent Imaging:   Assessment & Plan: I am not convinced the patient has interstitial cystitis but I offered bladder rescue treatments 2-3 times a week for 3 weeks.  If she had a positive response it was support the diagnosis.  Her symptoms do come and go and she could possibly have a false positive  I will have patient see nurse practitioner in 3 weeks.  I do not think we should advance other treatments of interstitial cystitis symptom not convinced she has it.  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  I had given her oxybutynin and Vesicare.  One of them made her sick to the stomach and the other was not covered by insurance.  It may  be difficult to reach her treatment goal  There are no diagnoses linked to this encounter.  No follow-ups on file.  Reece Packer, MD  [facility] [address], [place], [place] [address] [phone]

## 2019-07-29 NOTE — Addendum Note (Signed)
Addended by: Alvera Novel on: 07/29/2019 12:05 PM   Modules accepted: Orders

## 2019-07-31 ENCOUNTER — Encounter: Payer: Self-pay | Admitting: Physician Assistant

## 2019-07-31 ENCOUNTER — Ambulatory Visit (INDEPENDENT_AMBULATORY_CARE_PROVIDER_SITE_OTHER): Payer: Medicare HMO | Admitting: Physician Assistant

## 2019-07-31 ENCOUNTER — Other Ambulatory Visit: Payer: Self-pay

## 2019-07-31 VITALS — BP 147/92 | HR 77 | Ht 64.0 in | Wt 161.8 lb

## 2019-07-31 DIAGNOSIS — R32 Unspecified urinary incontinence: Secondary | ICD-10-CM | POA: Diagnosis not present

## 2019-07-31 MED ORDER — LIDOCAINE HCL 1 % IJ SOLN
11.0000 mL | Freq: Once | INTRAMUSCULAR | Status: AC
  Administered 2019-07-31: 11 mL

## 2019-07-31 NOTE — Progress Notes (Signed)
Bladder Rescue Solution Instillation  Due to chronic dysuria patient is present today for a Rescue Solution Treatment.  Patient was cleaned and prepped in a sterile fashion with betadine.  A lubricated 14 FR catheter was inserted, urine return was noted 73m, urine was yellow in color.  Instilled a solution consisting of 845mof Sodium Bicarb, 2 ml Lidocaine and 1 ml of Heparin. The catheter was then removed. Patient tolerated well, no complications were noted.   Performed by: SaDebroah LoopPA-C and CrBradly BienenstockCMA  Follow up/ Additional Notes: 2 days for rescue instillation

## 2019-07-31 NOTE — Progress Notes (Signed)
  Bladder Rescue Solution Instillation  Due to IC patient is present today for a Rescue Solution Treatment.  Patient was cleaned and prepped in a sterile fashion with betadine and lidocaine 2% jelly was instilled into the urethra.  A 16 FR catheter was inserted, urine return was noted 5 ml, urine was yellow in color.  Instilled a solution consisting of 32m of Sodium Bicarb, 2 ml Lidocaine and 1 ml of Heparin. The catheter was then removed. Patient tolerated well, no complications were noted.   Performed by: AKerman Passey RMA  Follow up/ Additional Notes: PT will follow up 3x a week for 3 weeks

## 2019-08-01 LAB — CULTURE, URINE COMPREHENSIVE

## 2019-08-02 ENCOUNTER — Ambulatory Visit (INDEPENDENT_AMBULATORY_CARE_PROVIDER_SITE_OTHER): Payer: Medicare HMO | Admitting: Physician Assistant

## 2019-08-02 ENCOUNTER — Other Ambulatory Visit: Payer: Self-pay

## 2019-08-02 DIAGNOSIS — B373 Candidiasis of vulva and vagina: Secondary | ICD-10-CM

## 2019-08-02 DIAGNOSIS — N3020 Other chronic cystitis without hematuria: Secondary | ICD-10-CM

## 2019-08-02 DIAGNOSIS — B3731 Acute candidiasis of vulva and vagina: Secondary | ICD-10-CM

## 2019-08-02 LAB — URINALYSIS, COMPLETE
Bilirubin, UA: NEGATIVE
Glucose, UA: NEGATIVE
Leukocytes,UA: NEGATIVE
Nitrite, UA: NEGATIVE
Protein,UA: NEGATIVE
RBC, UA: NEGATIVE
Specific Gravity, UA: 1.025 (ref 1.005–1.030)
Urobilinogen, Ur: 0.2 mg/dL (ref 0.2–1.0)
pH, UA: 5 (ref 5.0–7.5)

## 2019-08-02 LAB — MICROSCOPIC EXAMINATION: RBC, Urine: NONE SEEN /hpf (ref 0–2)

## 2019-08-02 MED ORDER — SODIUM BICARBONATE 8.4 % IV SOLN
11.0000 mL | Freq: Once | INTRAVENOUS | Status: AC
  Administered 2019-08-02: 11 mL

## 2019-08-02 NOTE — Progress Notes (Signed)
Bladder Rescue Solution Instillation  Due to chronic dysuria patient is present today for a Rescue Solution Treatment.  Patient was cleaned and prepped in a sterile fashion with betadine.  A 14 FR catheter was inserted, urine return was noted 51m, urine was yellow in color.  Instilled a solution consisting of 886mof Sodium Bicarb, 2 ml Lidocaine and 1 ml of Heparin. The catheter was then removed. Patient tolerated well, no complications were noted.   Performed by: SaDebroah LoopPA-C   Follow up/ Additional Notes: Patient reports pruritic bumpy rash of the labia x2 days. On physical exam, labia majora erythematous with superficial erosion consistent with excoriation. Counseled patient to start OTC antifungal for vulvovaginal candidiasis and follow up with OBGYN if symptoms do not improve within 1 week. She expressed understanding.

## 2019-08-05 ENCOUNTER — Encounter: Payer: Self-pay | Admitting: Physician Assistant

## 2019-08-05 ENCOUNTER — Ambulatory Visit (INDEPENDENT_AMBULATORY_CARE_PROVIDER_SITE_OTHER): Payer: Medicare HMO | Admitting: Physician Assistant

## 2019-08-05 ENCOUNTER — Other Ambulatory Visit: Payer: Self-pay

## 2019-08-05 VITALS — BP 176/106 | HR 78 | Ht 64.0 in | Wt 160.0 lb

## 2019-08-05 DIAGNOSIS — N3020 Other chronic cystitis without hematuria: Secondary | ICD-10-CM

## 2019-08-05 LAB — URINALYSIS, COMPLETE
Bilirubin, UA: NEGATIVE
Glucose, UA: NEGATIVE
Ketones, UA: NEGATIVE
Leukocytes,UA: NEGATIVE
Nitrite, UA: NEGATIVE
Protein,UA: NEGATIVE
RBC, UA: NEGATIVE
Specific Gravity, UA: <1.005 — ABNORMAL LOW (ref 1.005–1.030)
Urobilinogen, Ur: 0.2 mg/dL (ref 0.2–1.0)
pH, UA: 5 (ref 5.0–7.5)

## 2019-08-05 LAB — MICROSCOPIC EXAMINATION
Bacteria, UA: NONE SEEN
RBC, Urine: NONE SEEN /hpf (ref 0–2)

## 2019-08-05 MED ORDER — SODIUM BICARBONATE 8.4 % IV SOLN
11.0000 mL | Freq: Once | INTRAVENOUS | Status: AC
  Administered 2019-08-05: 11 mL

## 2019-08-05 NOTE — Progress Notes (Signed)
Bladder Rescue Solution Instillation  Due to chronic dysuria patient is present today for a Rescue Solution Treatment.  Patient was cleaned and prepped in a sterile fashion with betadine and lidocaine 2% jelly was instilled into the urethra.  A 14 FR catheter was inserted, urine return was noted 73m, urine was yellow in color.  Instilled a solution consisting of 838mof Sodium Bicarb, 2 ml Lidocaine and 1 ml of Heparin. The catheter was then removed. Patient tolerated well, no complications were noted.   Performed by: SaDebroah LoopPA-C   Follow up/ Additional Notes: Patient deferring treatment for vulvar erythema pending OBGYN follow-up.

## 2019-08-06 LAB — URINALYSIS, COMPLETE
Bilirubin, UA: NEGATIVE
Ketones, UA: NEGATIVE
Leukocytes,UA: NEGATIVE
Nitrite, UA: NEGATIVE
Protein,UA: NEGATIVE
RBC, UA: NEGATIVE
Specific Gravity, UA: >1.03 — ABNORMAL HIGH (ref 1.005–1.030)
Urobilinogen, Ur: 0.2 mg/dL (ref 0.2–1.0)
pH, UA: 5.5 (ref 5.0–7.5)

## 2019-08-06 LAB — MICROSCOPIC EXAMINATION: RBC, Urine: NONE SEEN /hpf (ref 0–2)

## 2019-08-06 NOTE — Progress Notes (Signed)
Bladder Rescue Solution Instillation  Due to bladder pain patient is present today for a Rescue Solution Treatment.  Patient was cleaned and prepped in a sterile fashion with betadine and lidocaine 2% jelly was instilled into the urethra.  A 14 FR catheter was inserted, urine return was noted 150 ml, urine was yellow clear in color.  Instilled a solution consisting of 39m of Sodium Bicarb, 2 ml Lidocaine and 1 ml of Heparin. The catheter was then removed. Patient tolerated well, no complications were noted.   Performed by: SLaneta Simmers Follow up/ Additional Notes: Follow up on 08/09/2019

## 2019-08-07 ENCOUNTER — Ambulatory Visit (INDEPENDENT_AMBULATORY_CARE_PROVIDER_SITE_OTHER): Payer: Medicare HMO | Admitting: Urology

## 2019-08-07 ENCOUNTER — Other Ambulatory Visit: Payer: Self-pay

## 2019-08-07 ENCOUNTER — Encounter: Payer: Self-pay | Admitting: Urology

## 2019-08-07 VITALS — BP 149/85 | HR 80 | Ht 62.0 in | Wt 160.0 lb

## 2019-08-07 DIAGNOSIS — N3020 Other chronic cystitis without hematuria: Secondary | ICD-10-CM

## 2019-08-07 MED ORDER — SODIUM BICARBONATE 8.4 % IV SOLN: 11.0000 mL | Freq: Once | INTRAVENOUS | Status: DC

## 2019-08-07 MED ORDER — LIDOCAINE HCL 1 % IJ SOLN
11.0000 mL | Freq: Once | INTRAMUSCULAR | Status: AC
  Administered 2019-08-07: 11 mL

## 2019-08-08 LAB — URINALYSIS, COMPLETE
Bilirubin, UA: NEGATIVE
Glucose, UA: NEGATIVE
Ketones, UA: NEGATIVE
Leukocytes,UA: NEGATIVE
Nitrite, UA: NEGATIVE
Protein,UA: NEGATIVE
RBC, UA: NEGATIVE
Specific Gravity, UA: <1.005 — ABNORMAL LOW (ref 1.005–1.030)
Urobilinogen, Ur: 0.2 mg/dL (ref 0.2–1.0)
pH, UA: 5.5 (ref 5.0–7.5)

## 2019-08-08 LAB — MICROSCOPIC EXAMINATION: Bacteria, UA: NONE SEEN

## 2019-08-09 ENCOUNTER — Other Ambulatory Visit: Payer: Self-pay

## 2019-08-09 ENCOUNTER — Ambulatory Visit (INDEPENDENT_AMBULATORY_CARE_PROVIDER_SITE_OTHER): Payer: Medicare HMO | Admitting: Physician Assistant

## 2019-08-09 DIAGNOSIS — N3020 Other chronic cystitis without hematuria: Secondary | ICD-10-CM | POA: Diagnosis not present

## 2019-08-09 MED ORDER — SODIUM BICARBONATE 8.4 % IV SOLN
11.0000 mL | Freq: Once | INTRAVENOUS | Status: AC
  Administered 2019-08-09: 11 mL

## 2019-08-09 NOTE — Progress Notes (Signed)
Bladder Rescue Solution Instillation  Due to Chronic cystitis patient is present today for a Rescue Solution Treatment.  Patient was cleaned and prepped in a sterile fashion with betadine and lidocaine 2% jelly was instilled into the urethra.  A 14 FR catheter was inserted, urine return was noted 20 ml, urine was yellow in color.  Instilled a solution consisting of 35m of Sodium Bicarb, 2 ml Lidocaine and 1 ml of Heparin. The catheter was then removed. Patient tolerated well, no complications were noted.   Performed by: SFonnie Jarvis ARetia PasseO`Sullivan  Follow up/ Additional Notes: Follow up as scheduled

## 2019-08-12 ENCOUNTER — Other Ambulatory Visit: Payer: Self-pay

## 2019-08-12 ENCOUNTER — Ambulatory Visit (INDEPENDENT_AMBULATORY_CARE_PROVIDER_SITE_OTHER): Payer: Medicare HMO | Admitting: Physician Assistant

## 2019-08-12 ENCOUNTER — Encounter: Payer: Self-pay | Admitting: Physician Assistant

## 2019-08-12 VITALS — BP 157/94 | HR 82 | Temp 97.4°F | Ht 62.0 in | Wt 160.0 lb

## 2019-08-12 DIAGNOSIS — N3020 Other chronic cystitis without hematuria: Secondary | ICD-10-CM | POA: Diagnosis not present

## 2019-08-12 LAB — URINALYSIS, COMPLETE
Bilirubin, UA: NEGATIVE
Glucose, UA: NEGATIVE
Ketones, UA: NEGATIVE
Nitrite, UA: NEGATIVE
Protein,UA: NEGATIVE
RBC, UA: NEGATIVE
Specific Gravity, UA: 1.025 (ref 1.005–1.030)
Urobilinogen, Ur: 0.2 mg/dL (ref 0.2–1.0)
pH, UA: 5 (ref 5.0–7.5)

## 2019-08-12 LAB — MICROSCOPIC EXAMINATION: RBC, Urine: NONE SEEN /hpf (ref 0–2)

## 2019-08-12 LAB — BLADDER SCAN AMB NON-IMAGING: Scan Result: 22

## 2019-08-12 NOTE — Progress Notes (Signed)
Presented to clinic today for bladder rescue instillation.  She reports worsening of her urinary symptoms x3 days including increased urinary urgency with decreased urinary output and fever.  PVR WNL, she is afebrile in clinic today.  Overall, patient reports that she does not believe the bladder rescue installations (6 completed) are helping her symptoms and she would like to stop them at this time.  UA today reassuring for infection, will send for culture to confirm.  Discussed this case with Dr. Matilde Sprang, who recommends referral to Dr. Amalia Hailey at [facility] for second opinion.  Patient is in agreement with this plan.  We will cancel remaining bladder rescue instillation appointments.  No instillation administered today.  Vitals:   08/12/19 0832  BP: (!) 157/94  Pulse: 82  Temp: (!) 97.4 F (36.3 C)    Results for orders placed or performed in visit on 08/12/19  Bladder Scan (Post Void Residual) in office  Result Value Ref Range   Scan Result 22     I spent 15 minutes on the day of the encounter to include pre-visit record review, face-to-face time with the patient, and post-visit ordering of tests.  Debroah Loop, PA-C 08/12/19 9:22 AM

## 2019-08-13 LAB — URINALYSIS, COMPLETE
Bilirubin, UA: NEGATIVE
Glucose, UA: NEGATIVE
Ketones, UA: NEGATIVE
Leukocytes,UA: NEGATIVE
Nitrite, UA: NEGATIVE
Protein,UA: NEGATIVE
RBC, UA: NEGATIVE
Specific Gravity, UA: 1.025 (ref 1.005–1.030)
Urobilinogen, Ur: 0.2 mg/dL (ref 0.2–1.0)
pH, UA: 5.5 (ref 5.0–7.5)

## 2019-08-13 LAB — MICROSCOPIC EXAMINATION: RBC, Urine: NONE SEEN /hpf (ref 0–2)

## 2019-08-14 ENCOUNTER — Ambulatory Visit
Admission: RE | Admit: 2019-08-14 | Discharge: 2019-08-14 | Disposition: A | Discharge status: Home / Self Care | Payer: Medicare HMO | Source: Ambulatory Visit | Attending: Vascular Surgery | Admitting: Vascular Surgery

## 2019-08-14 ENCOUNTER — Ambulatory Visit: Payer: Self-pay | Admitting: Physician Assistant

## 2019-08-14 ENCOUNTER — Other Ambulatory Visit: Payer: Self-pay

## 2019-08-14 DIAGNOSIS — Z452 Encounter for adjustment and management of vascular access device: Secondary | ICD-10-CM | POA: Insufficient documentation

## 2019-08-14 MED ORDER — SODIUM CHLORIDE 0.9% FLUSH
3.0000 mL | Freq: Once | INTRAVENOUS | Status: AC
  Administered 2019-08-14: 3 mL

## 2019-08-14 MED ORDER — HEPARIN SOD (PORK) LOCK FLUSH 100 UNIT/ML IV SOLN
INTRAVENOUS | Status: AC
  Administered 2019-08-14: 500 [IU] via INTRAVENOUS
  Filled 2019-08-14: qty 5

## 2019-08-14 MED ORDER — HEPARIN SOD (PORK) LOCK FLUSH 100 UNIT/ML IV SOLN: 500.0000 [IU] | Freq: Once | INTRAVENOUS | Status: AC

## 2019-08-15 LAB — CULTURE, URINE COMPREHENSIVE

## 2019-08-16 ENCOUNTER — Ambulatory Visit: Payer: Self-pay | Admitting: Physician Assistant

## 2019-08-28 DIAGNOSIS — R109 Unspecified abdominal pain: Secondary | ICD-10-CM | POA: Diagnosis not present

## 2019-08-28 DIAGNOSIS — G8929 Other chronic pain: Secondary | ICD-10-CM | POA: Diagnosis not present

## 2019-08-28 DIAGNOSIS — K219 Gastro-esophageal reflux disease without esophagitis: Secondary | ICD-10-CM | POA: Diagnosis not present

## 2019-08-28 DIAGNOSIS — K580 Irritable bowel syndrome with diarrhea: Secondary | ICD-10-CM | POA: Diagnosis not present

## 2019-08-28 DIAGNOSIS — E1365 Other specified diabetes mellitus with hyperglycemia: Secondary | ICD-10-CM | POA: Diagnosis not present

## 2019-08-28 DIAGNOSIS — K8689 Other specified diseases of pancreas: Secondary | ICD-10-CM | POA: Diagnosis not present

## 2019-08-28 DIAGNOSIS — K760 Fatty (change of) liver, not elsewhere classified: Secondary | ICD-10-CM | POA: Diagnosis not present

## 2019-08-28 DIAGNOSIS — R159 Full incontinence of feces: Secondary | ICD-10-CM | POA: Diagnosis not present

## 2019-08-29 DIAGNOSIS — K580 Irritable bowel syndrome with diarrhea: Secondary | ICD-10-CM | POA: Diagnosis not present

## 2019-08-29 DIAGNOSIS — K760 Fatty (change of) liver, not elsewhere classified: Secondary | ICD-10-CM | POA: Diagnosis not present

## 2019-08-29 DIAGNOSIS — R109 Unspecified abdominal pain: Secondary | ICD-10-CM | POA: Diagnosis not present

## 2019-08-29 DIAGNOSIS — R159 Full incontinence of feces: Secondary | ICD-10-CM | POA: Diagnosis not present

## 2019-08-29 DIAGNOSIS — K8689 Other specified diseases of pancreas: Secondary | ICD-10-CM | POA: Diagnosis not present

## 2019-08-29 DIAGNOSIS — G8929 Other chronic pain: Secondary | ICD-10-CM | POA: Diagnosis not present

## 2019-08-30 DIAGNOSIS — E139 Other specified diabetes mellitus without complications: Secondary | ICD-10-CM | POA: Diagnosis not present

## 2019-08-30 DIAGNOSIS — Z9641 Presence of insulin pump (external) (internal): Secondary | ICD-10-CM | POA: Diagnosis not present

## 2019-08-30 DIAGNOSIS — E1142 Type 2 diabetes mellitus with diabetic polyneuropathy: Secondary | ICD-10-CM | POA: Diagnosis not present

## 2019-08-30 DIAGNOSIS — E1159 Type 2 diabetes mellitus with other circulatory complications: Secondary | ICD-10-CM | POA: Diagnosis not present

## 2019-08-30 DIAGNOSIS — E1365 Other specified diabetes mellitus with hyperglycemia: Secondary | ICD-10-CM | POA: Diagnosis not present

## 2019-09-09 ENCOUNTER — Emergency Department: Payer: Medicare HMO

## 2019-09-09 ENCOUNTER — Encounter: Payer: Self-pay | Admitting: Emergency Medicine

## 2019-09-09 ENCOUNTER — Other Ambulatory Visit: Payer: Self-pay

## 2019-09-09 ENCOUNTER — Emergency Department
Admission: EM | Admit: 2019-09-09 | Discharge: 2019-09-09 | Disposition: A | Discharge status: Home / Self Care | Payer: Medicare HMO | Attending: Emergency Medicine | Admitting: Emergency Medicine

## 2019-09-09 DIAGNOSIS — R0789 Other chest pain: Secondary | ICD-10-CM | POA: Diagnosis present

## 2019-09-09 DIAGNOSIS — L03313 Cellulitis of chest wall: Secondary | ICD-10-CM | POA: Diagnosis not present

## 2019-09-09 DIAGNOSIS — L089 Local infection of the skin and subcutaneous tissue, unspecified: Secondary | ICD-10-CM | POA: Diagnosis not present

## 2019-09-09 DIAGNOSIS — C539 Malignant neoplasm of cervix uteri, unspecified: Secondary | ICD-10-CM | POA: Insufficient documentation

## 2019-09-09 DIAGNOSIS — Z803 Family history of malignant neoplasm of breast: Secondary | ICD-10-CM | POA: Insufficient documentation

## 2019-09-09 DIAGNOSIS — Z95828 Presence of other vascular implants and grafts: Secondary | ICD-10-CM | POA: Insufficient documentation

## 2019-09-09 DIAGNOSIS — Z79899 Other long term (current) drug therapy: Secondary | ICD-10-CM | POA: Insufficient documentation

## 2019-09-09 DIAGNOSIS — T829XXS Unspecified complication of cardiac and vascular prosthetic device, implant and graft, sequela: Secondary | ICD-10-CM | POA: Diagnosis not present

## 2019-09-09 DIAGNOSIS — M79601 Pain in right arm: Secondary | ICD-10-CM | POA: Insufficient documentation

## 2019-09-09 DIAGNOSIS — Z8042 Family history of malignant neoplasm of prostate: Secondary | ICD-10-CM | POA: Insufficient documentation

## 2019-09-09 DIAGNOSIS — N189 Chronic kidney disease, unspecified: Secondary | ICD-10-CM | POA: Insufficient documentation

## 2019-09-09 DIAGNOSIS — M79621 Pain in right upper arm: Secondary | ICD-10-CM | POA: Diagnosis not present

## 2019-09-09 DIAGNOSIS — E1122 Type 2 diabetes mellitus with diabetic chronic kidney disease: Secondary | ICD-10-CM | POA: Insufficient documentation

## 2019-09-09 DIAGNOSIS — Z800 Family history of malignant neoplasm of digestive organs: Secondary | ICD-10-CM | POA: Diagnosis not present

## 2019-09-09 DIAGNOSIS — Z794 Long term (current) use of insulin: Secondary | ICD-10-CM | POA: Diagnosis not present

## 2019-09-09 DIAGNOSIS — Z452 Encounter for adjustment and management of vascular access device: Secondary | ICD-10-CM | POA: Diagnosis not present

## 2019-09-09 DIAGNOSIS — I1310 Hypertensive heart and chronic kidney disease without heart failure, with stage 1 through stage 4 chronic kidney disease, or unspecified chronic kidney disease: Secondary | ICD-10-CM | POA: Diagnosis not present

## 2019-09-09 DIAGNOSIS — R600 Localized edema: Secondary | ICD-10-CM | POA: Diagnosis not present

## 2019-09-09 DIAGNOSIS — Z959 Presence of cardiac and vascular implant and graft, unspecified: Secondary | ICD-10-CM | POA: Diagnosis not present

## 2019-09-09 DIAGNOSIS — Z20822 Contact with and (suspected) exposure to covid-19: Secondary | ICD-10-CM | POA: Diagnosis not present

## 2019-09-09 DIAGNOSIS — M79603 Pain in arm, unspecified: Secondary | ICD-10-CM

## 2019-09-09 DIAGNOSIS — I639 Cerebral infarction, unspecified: Secondary | ICD-10-CM | POA: Insufficient documentation

## 2019-09-09 DIAGNOSIS — M310 Hypersensitivity angiitis: Secondary | ICD-10-CM | POA: Diagnosis not present

## 2019-09-09 DIAGNOSIS — M25511 Pain in right shoulder: Secondary | ICD-10-CM | POA: Diagnosis not present

## 2019-09-09 LAB — URINALYSIS, COMPLETE (UACMP) WITH MICROSCOPIC
Bilirubin Urine: NEGATIVE
Glucose, UA: NEGATIVE mg/dL
Hgb urine dipstick: NEGATIVE
Ketones, ur: 5 mg/dL — AB
Nitrite: NEGATIVE
Protein, ur: NEGATIVE mg/dL
Specific Gravity, Urine: 1.01 (ref 1.005–1.030)
pH: 5 (ref 5.0–8.0)

## 2019-09-09 LAB — COMPREHENSIVE METABOLIC PANEL
ALT: 39 U/L (ref 0–44)
AST: 24 U/L (ref 15–41)
Albumin: 3.8 g/dL (ref 3.5–5.0)
Alkaline Phosphatase: 147 U/L — ABNORMAL HIGH (ref 38–126)
Anion gap: 9 (ref 5–15)
BUN: 9 mg/dL (ref 6–20)
CO2: 30 mmol/L (ref 22–32)
Calcium: 7.9 mg/dL — ABNORMAL LOW (ref 8.9–10.3)
Chloride: 100 mmol/L (ref 98–111)
Creatinine, Ser: 0.66 mg/dL (ref 0.44–1.00)
GFR calc Af Amer: >60 mL/min (ref >60)
GFR calc non Af Amer: >60 mL/min (ref >60)
Glucose, Bld: 253 mg/dL — ABNORMAL HIGH (ref 70–99)
Potassium: 2.6 mmol/L — CL (ref 3.5–5.1)
Sodium: 139 mmol/L (ref 135–145)
Total Bilirubin: 5.2 mg/dL — ABNORMAL HIGH (ref 0.3–1.2)
Total Protein: 6.7 g/dL (ref 6.5–8.1)

## 2019-09-09 LAB — CBC WITH DIFFERENTIAL/PLATELET
Abs Immature Granulocytes: 0.03 10*3/uL (ref 0.00–0.07)
Basophils Absolute: 0 10*3/uL (ref 0.0–0.1)
Basophils Relative: 0 %
Eosinophils Absolute: 0 10*3/uL (ref 0.0–0.5)
Eosinophils Relative: 1 %
HCT: 34.4 % — ABNORMAL LOW (ref 36.0–46.0)
Hemoglobin: 12.5 g/dL (ref 12.0–15.0)
Immature Granulocytes: 0 %
Lymphocytes Relative: 25 %
Lymphs Abs: 2.1 10*3/uL (ref 0.7–4.0)
MCH: 29.7 pg (ref 26.0–34.0)
MCHC: 36.3 g/dL — ABNORMAL HIGH (ref 30.0–36.0)
MCV: 81.7 fL (ref 80.0–100.0)
Monocytes Absolute: 0.5 10*3/uL (ref 0.1–1.0)
Monocytes Relative: 6 %
Neutro Abs: 5.8 10*3/uL (ref 1.7–7.7)
Neutrophils Relative %: 68 %
Platelets: 161 10*3/uL (ref 150–400)
RBC: 4.21 MIL/uL (ref 3.87–5.11)
RDW: 13 % (ref 11.5–15.5)
WBC: 8.5 10*3/uL (ref 4.0–10.5)
nRBC: 0 % (ref 0.0–0.2)

## 2019-09-09 LAB — LACTIC ACID, PLASMA: Lactic Acid, Venous: 1.4 mmol/L (ref 0.5–1.9)

## 2019-09-09 LAB — GLUCOSE, CAPILLARY: Glucose-Capillary: 149 mg/dL — ABNORMAL HIGH (ref 70–99)

## 2019-09-09 LAB — SARS CORONAVIRUS 2 BY RT PCR (HOSPITAL ORDER, PERFORMED IN ~~LOC~~ HOSPITAL LAB): SARS Coronavirus 2: NEGATIVE

## 2019-09-09 MED ORDER — CEPHALEXIN 500 MG PO CAPS: 500.0000 mg | ORAL_CAPSULE | Freq: Four times a day (QID) | ORAL | 0 refills | Status: AC

## 2019-09-09 MED ORDER — OXYCODONE-ACETAMINOPHEN 5-325 MG PO TABS
2.0000 | ORAL_TABLET | Freq: Once | ORAL | Status: AC
  Administered 2019-09-09: 2 via ORAL
  Filled 2019-09-09: qty 2

## 2019-09-09 MED ORDER — LACTATED RINGERS IV BOLUS
1000.0000 mL | Freq: Once | INTRAVENOUS | Status: AC
  Administered 2019-09-09: 1000 mL via INTRAVENOUS

## 2019-09-09 MED ORDER — OXYCODONE-ACETAMINOPHEN 5-325 MG PO TABS: 1.0000 | ORAL_TABLET | Freq: Three times a day (TID) | ORAL | 0 refills | Status: DC | PRN

## 2019-09-09 MED ORDER — CHLORHEXIDINE GLUCONATE CLOTH 2 % EX PADS
6.0000 | MEDICATED_PAD | Freq: Every day | CUTANEOUS | Status: DC
  Filled 2019-09-09: qty 6

## 2019-09-09 MED ORDER — HYDROMORPHONE HCL 1 MG/ML IJ SOLN
0.5000 mg | Freq: Once | INTRAMUSCULAR | Status: AC
  Administered 2019-09-09: 0.5 mg via INTRAVENOUS
  Filled 2019-09-09: qty 1

## 2019-09-09 MED ORDER — SODIUM CHLORIDE 0.9% FLUSH: 10.0000 mL | INTRAVENOUS | Status: DC | PRN

## 2019-09-09 MED ORDER — POTASSIUM CHLORIDE CRYS ER 20 MEQ PO TBCR
60.0000 meq | EXTENDED_RELEASE_TABLET | Freq: Once | ORAL | Status: AC
  Administered 2019-09-09: 60 meq via ORAL
  Filled 2019-09-09: qty 3

## 2019-09-09 MED ORDER — DOXYCYCLINE HYCLATE 100 MG PO CAPS
100.0000 mg | ORAL_CAPSULE | Freq: Two times a day (BID) | ORAL | 0 refills | Status: AC
Start: 2019-09-09 — End: 2019-09-16

## 2019-09-09 MED ORDER — PROMETHAZINE HCL 25 MG/ML IJ SOLN
12.5000 mg | Freq: Once | INTRAMUSCULAR | Status: AC
  Administered 2019-09-09: 12.5 mg via INTRAVENOUS
  Filled 2019-09-09: qty 1

## 2019-09-09 NOTE — ED Provider Notes (Signed)
Potassium was low and she was given extra dose of oral potassium.  Patient states she knew her potassium was low.  Calcium appears to be chronically low as well and she is on calcium.  We will continue with plan for outpatient follow-up.   Earleen Newport, MD 09/09/19 [phone]

## 2019-09-09 NOTE — ED Provider Notes (Signed)
[facility] Emergency Department Provider Note   ____________________________________________   First MD Initiated Contact with Patient 09/09/19 1219     (approximate)  I have reviewed the triage vital signs and the nursing notes.   HISTORY  Chief Complaint Blood Infection and Shoulder Pain    HPI Amber Christensen is a 54 y.o. female with past medical history of hypertension, [name] cirrhosis, diabetes, and chronic pancreatitis who presents to the ED complaining of chest wall and shoulder pain.  Patient reports that last night she started to notice gradually worsening pain over her right upper chest wall and moving into her shoulder.  She describes it as a constant throbbing that is exacerbated by touch as well as movement of her right shoulder or neck.  She had noticed some redness and swelling in this area, and when she was evaluated by her rheumatologist earlier today, she was referred to the ED due to concern for infection of her Mediport located in the same area.  She is concerned she had a fever because her temperature was above 99 at home, but she denies any temperatures greater than 100.4.  She has not had any cough, shortness of breath, vomiting, dysuria, hematuria, or abdominal pain.  She describes symptoms as similar to when she had her prior Mediport infected on the left side.        Past Medical History:  Diagnosis Date  . Allergy    many many allergies  . Anemia    in past  . Anginal pain ([place]) 01/2018   cardiac workup clear  . Anxiety   . 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[place])    pt unaware of this diagnosis  . Bipolar disorder ([place])   . Bladder mass    removed and had tbt. mesh removed  . Blood transfusion without reported diagnosis 1999   received 20 units of blood after tearing esophagus from vomitting so severely after ERCP  . Breast mass    left side biopsy was negative  . Carpal tunnel syndrome    left hand  . Cervical cancer ([place])  2002  . Chronic kidney disease    cyst on left kidney  . Chronic pancreatitis ([place])    prior to pancreas surgery  . Cirrhosis ([place])   . Clotting disorder ([place])    pt states that sometimes she has difficulty stopping to bleed and other times it is okay  . Depression   . Diabetes mellitus without complication (HCC)    has insulin pump  . Dyspnea   . Esophagitis   . GERD (gastroesophageal reflux disease)   . Headache    migraines...takes compazine for this  . Heart disease   . 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 Hypertension   . Irritable bowel syndrome   . Kidney mass    just watching. left kidney mass.  . Liver disease    NASH. has had liver biopsies. negative except for NASH  . NASH (nonalcoholic steatohepatitis)   . NASH (nonalcoholic steatohepatitis)   . Pancreatitis   . PONV (postoperative nausea and vomiting)   . Stroke ([place]) 11/2017   had tia. no longer needs plavix  . Thyroid disease     Patient Active Problem List   Diagnosis Date Noted  . Hyperglycemia 09/23/2018  . S/P ventral herniorrhaphy 03/15/2018  . Ventral hernia without obstruction or gangrene   . Hypertension 03/21/2017  .  Diabetes mellitus without complication ([place]) 35/57/3220  . Pancreatitis 03/21/2017  . Poor venous access 03/21/2017  . Vasculitis ([place]) 08/03/2016  . Acute maculopapular rash 04/21/2016  . Maculopapular rash, generalized 04/20/2016  . Unstable angina ([place]) 09/28/2015    Past Surgical History:  Procedure Laterality Date  . ABDOMINAL HYSTERECTOMY    . APPENDECTOMY    . BLADDER SURGERY     mesh removed. revision which has caused everything to fall again  . BOWEL RESECTION     took a piece of bowel out after pancreatectomy caused problem with bowel.   Marland Kitchen BREAST BIOPSY Left 1989   benign  . CARDIAC CATHETERIZATION  2542,7062, 1998   no stents, fatty streaks  . CHOLECYSTECTOMY    . COLON SURGERY    .  COLONOSCOPY WITH PROPOFOL N/A 10/08/2018   Procedure: COLONOSCOPY WITH PROPOFOL;  Surgeon: Lollie Sails, MD;  Location: [facility] ENDOSCOPY;  Service: Endoscopy;  Laterality: N/A;  . CYST REMOVAL HAND Left 1992   Ganglion cyst removed from Left Wrist  . CYSTECTOMY    . CYSTO WITH HYDRODISTENSION N/A 03/04/2019   Procedure: CYSTOSCOPY/HYDRODISTENSION;  Surgeon: Bjorn Loser, MD;  Location: [facility] ORS;  Service: Urology;  Laterality: N/A;  . DILATATION & CURETTAGE/HYSTEROSCOPY WITH MYOSURE    . DILATION AND CURETTAGE OF UTERUS    . ERCP    . ESOPHAGOGASTRODUODENOSCOPY    . HERNIA REPAIR    . KNEE ARTHROSCOPY Left 1992  . LEFT HEART CATH AND CORONARY ANGIOGRAPHY Left 02/06/2019   Procedure: LEFT HEART CATH AND CORONARY ANGIOGRAPHY;  Surgeon: Yolonda Kida, MD;  Location: [place] CV LAB;  Service: Cardiovascular;  Laterality: Left;  . LIVER BIOPSY    . PANCREAS SURGERY  2006   partial pancreatectomy after endoscope knicked a part of pancreas.   Marland Kitchen PORTA CATH INSERTION N/A 04/10/2017   Procedure: PORTA CATH INSERTION;  Surgeon: Algernon Huxley, MD;  Location: [place] [facility];  Service: Cardiovascular;  Laterality: N/A;  . PORTA CATH INSERTION N/A 02/21/2019   Procedure: PORTA CATH INSERTION;  Surgeon: Algernon Huxley, MD;  Location: [place] [facility];  Service: Cardiovascular;  Laterality: N/A;  . REPAIR OF ESOPHAGUS  2012   3 clamps placed in esophagus  . ROBOTIC ASSISTED LAPAROSCOPIC VENTRAL/INCISIONAL HERNIA REPAIR N/A 03/15/2018   Procedure: ROBOTIC ASSISTED LAPAROSCOPIC VENTRAL HERNIA REPAIR;  Surgeon: Jules Husbands, MD;  Location: [facility] ORS;  Service: General;  Laterality: N/A;  . SMALL BOWEL REPAIR    . TONSILLECTOMY      Prior to Admission medications   Medication Sig Start Date End Date Taking? Authorizing Provider  amLODipine (NORVASC) 5 MG tablet Take 5 mg by mouth daily.    [provider]  Calcium Carb-Cholecalciferol (CALCIUM 600/VITAMIN D3 PO) Take 1  tablet by mouth daily.    [provider]  cephALEXin (KEFLEX) 500 MG capsule Take 1 capsule (500 mg total) by mouth 4 (four) times daily for 7 days. 09/09/19 09/16/19  Blake Divine, MD  Cetirizine HCl 10 MG CAPS Take 1 capsule by mouth daily in the afternoon.    [provider]  Cholecalciferol (VITAMIN D-3) 5000 units TABS Take 5,000 Units by mouth daily.    [provider]  Dexlansoprazole 30 MG capsule Take 30 mg by mouth daily.    [provider]  doxycycline (VIBRAMYCIN) 100 MG capsule Take 1 capsule (100 mg total) by mouth 2 (two) times daily for 7 days. 09/09/19 09/16/19  Blake Divine, MD  EPINEPHrine 0.3 mg/0.3 mL IJ SOAJ injection SMARTSIG:1 Pre-Filled Pen Syringe IM PRN 05/31/19   [provider]  gabapentin (NEURONTIN) 300 MG capsule Take 600 mg by mouth 3 (three) times daily. 08/30/19   [provider]  insulin lispro (HUMALOG) 100 UNIT/ML injection USE UP TO 70 UNITS SUBCUTANEOUSLY PER DAY VIA INSULIN PUMP. Dx code e10.65 05/14/19   [provider]  lipase/protease/amylase (CREON) 36000 UNITS CPEP capsule Take 144,000 Units by mouth 3 (three) times daily before meals.     [provider]  lisinopril (ZESTRIL) 40 MG tablet Take 40 mg by mouth daily. 09/03/18   [provider]  Magnesium (V-R MAGNESIUM) 250 MG TABS Take 250 mg by mouth daily.     [provider]  metoprolol succinate (TOPROL-XL) 25 MG 24 hr tablet Take 25 mg by mouth daily.    [provider]  MITIGARE 0.6 MG CAPS Take 1 capsule by mouth 2 (two) times daily. 04/09/19   [provider]  NOVOLOG 100 UNIT/ML injection SMARTSIG:0-70 Unit(s) SUB-Q Daily 08/23/19   [provider]  pantoprazole (PROTONIX) 40 MG tablet Take 1 tablet (40 mg total) by mouth daily. 07/26/19 07/25/20  Nena Polio, MD  solifenacin (VESICARE) 5 MG tablet Take 1 tablet (5 mg total) by mouth daily. 05/06/19   Bjorn Loser, MD  vitamin E  400 UNIT capsule Take 400 Units by mouth daily.    [provider]    Allergies Ciprofloxacin, Demerol [meperidine], Dilaudid [hydromorphone hcl], Metoclopramide, Morphine and related, Morpholine salicylate, Isosorbide nitrate, Sulfa antibiotics, Flagyl [metronidazole], Adhesive [tape], Darvon [propoxyphene], Flexeril [cyclobenzaprine], Levaquin [levofloxacin in d5w], Naproxen, Soma [carisoprodol], and Zofran [ondansetron hcl]  Family History  Problem Relation Age of Onset  . Cirrhosis Mother   . Colon cancer Mother 58       TAH/BSO in ~16; deceased at 29  . Hypertension Mother   . Breast cancer Mother 42       unconfirmed  . Hypertension Father   . Prostate cancer Father 7       currently 28  . Other Father        liver disease  . Breast cancer Maternal Aunt        age at dx unk.; currently 32  . Cervical cancer Maternal Aunt   . Cancer Maternal Uncle        unk. type; currently 72s  . Stomach cancer Paternal Aunt 69       deceased at 84  . Breast cancer Maternal Grandmother 76       possibly bilateral in 79s; deceased 4  . Non-Hodgkin's lymphoma Daughter        unconfirmed; currently 32  . Cancer Maternal Aunt        hematologic malignancy; deceased 23    Social History Social History   Tobacco Use  . Smoking status: Never Smoker  . Smokeless tobacco: Never Used  Vaping Use  . Vaping Use: Never used  Substance Use Topics  . Alcohol use: Yes    Comment: rarely  . Drug use: No    Review of Systems  Constitutional: Positive for fever/chills Eyes: No visual changes. ENT: No sore throat. Cardiovascular: Positive for chest pain. Respiratory: Denies shortness of breath. Gastrointestinal: No abdominal pain.  No nausea, no vomiting.  No diarrhea.  No constipation. Genitourinary: Negative for dysuria. Musculoskeletal: Negative for back pain. Skin: Positive for rash. Neurological: Negative for headaches, focal weakness or  numbness.  ____________________________________________   PHYSICAL EXAM:  VITAL SIGNS: ED Triage Vitals  Enc Vitals Group     BP 09/09/19 1214 (!) 188/101     Pulse Rate 09/09/19 1214 90     Resp 09/09/19 1214 20     Temp 09/09/19 1214 98.3 F (36.8 C)     Temp Source 09/09/19 1214 Oral     SpO2 09/09/19 1214 100 %     Weight 09/09/19 1114 158 lb (71.7 kg)     Height 09/09/19 1114 5' 3"  (1.6 m)     Head Circumference --      Peak Flow --      Pain Score 09/09/19 1114 10     Pain Loc --      Pain Edu? --      Excl. in [place]? --     Constitutional: Alert and oriented. Eyes: Conjunctivae are normal. Head: Atraumatic. Nose: No congestion/rhinnorhea. Mouth/Throat: Mucous membranes are moist. Neck: Normal ROM Cardiovascular: Normal rate, regular rhythm. Grossly normal heart sounds. Respiratory: Normal respiratory effort.  No retractions. Lungs CTAB.  Tenderness to palpation over right anterior chest wall in the area of her Mediport.  Erythema and warmth noted over Mediport with no focal fluctuance.  Erythema and warmth extend upward towards her right shoulder. Gastrointestinal: Soft and nontender. No distention. Genitourinary: deferred Musculoskeletal: No lower extremity tenderness nor edema. Neurologic:  Normal speech and language. No gross focal neurologic deficits are appreciated. Skin:  Skin is warm, dry and intact. No rash noted. Psychiatric: Mood and affect are normal. Speech and behavior are normal.  ____________________________________________   LABS (all labs ordered are listed, but only abnormal results are displayed)  Labs Reviewed  URINALYSIS, COMPLETE (UACMP) WITH MICROSCOPIC - Abnormal; Notable for the following components:      Result Value   Color, Urine YELLOW (*)    APPearance HAZY (*)    Ketones, ur 5 (*)    Leukocytes,Ua TRACE (*)    Bacteria, UA RARE (*)    All other components within normal limits  GLUCOSE, CAPILLARY - Abnormal; Notable for the  following components:   Glucose-Capillary 149 (*)    All other components within normal limits  CULTURE, BLOOD (ROUTINE X 2)  CULTURE, BLOOD (ROUTINE X 2)  CBC WITH DIFFERENTIAL/PLATELET  COMPREHENSIVE METABOLIC PANEL  LACTIC ACID, PLASMA  LACTIC ACID, PLASMA     PROCEDURES  Procedure(s) performed (including Critical Care):  Procedures   ____________________________________________   INITIAL IMPRESSION / ASSESSMENT AND PLAN / ED COURSE       54 year old female with possible history of hypertension, [place] cirrhosis, diabetes, and pancreatitis who presents to the ED complaining of pain, redness, and swelling located over her right upper chest wall and extending into the area of her shoulder.  She denies any trauma to this area, reports fevers but has not had a temperature either here or at home greater than 100.0.  Vital signs are reassuring and I doubt sepsis. Blood cultures were drawn from her Mediport to assess for potential infection, however simple cellulitis is also possible.  Ultrasound was negative for right upper extremity or right IJ DVT to explain her symptoms.  Case discussed with Dr. Lucky Cowboy of vascular surgery, who states that if work-up is unremarkable patient would be appropriate for discharge home on antibiotics with follow-up in 3 to 4 days.  Patient turned over to oncoming provider pending lab results, prescriptions for doxycycline and Keflex sent to patient's pharmacy in anticipation of discharge.      ____________________________________________   FINAL CLINICAL  IMPRESSION(S) / ED DIAGNOSES  Final diagnoses:  Arm pain  Cellulitis of chest wall  Port-A-Cath in place     ED Discharge Orders         Ordered    doxycycline (VIBRAMYCIN) 100 MG capsule  2 times daily     Discontinue  Reprint     09/09/19 1551    cephALEXin (KEFLEX) 500 MG capsule  4 times daily     Discontinue  Reprint     09/09/19 1551           Note:  This document was prepared using  Dragon voice recognition software and may include unintentional dictation errors.   Blake Divine, MD 09/09/19 [phone]

## 2019-09-09 NOTE — ED Triage Notes (Signed)
Pt reports was sent here by her MD due to a potential infection in her port. Pt reports pain to her right shoulder, redness around the site and warm to the touch. Pt reports symptoms started last pm and called her MD this am. Pt reports has been in place since April. Pt also reports fever at home.

## 2019-09-09 NOTE — ED Notes (Signed)
Iv started and meds given.  Blood sent to lab.   Dr Charna Archer aware.

## 2019-09-13 DIAGNOSIS — E1159 Type 2 diabetes mellitus with other circulatory complications: Secondary | ICD-10-CM | POA: Diagnosis not present

## 2019-09-14 LAB — CULTURE, BLOOD (ROUTINE X 2)
Culture: NO GROWTH
Culture: NO GROWTH
Special Requests: ADEQUATE

## 2019-09-19 DIAGNOSIS — E1365 Other specified diabetes mellitus with hyperglycemia: Secondary | ICD-10-CM | POA: Diagnosis not present

## 2019-09-19 DIAGNOSIS — E139 Other specified diabetes mellitus without complications: Secondary | ICD-10-CM | POA: Diagnosis not present

## 2019-09-19 DIAGNOSIS — Z95828 Presence of other vascular implants and grafts: Secondary | ICD-10-CM | POA: Diagnosis not present

## 2019-09-19 DIAGNOSIS — E876 Hypokalemia: Secondary | ICD-10-CM | POA: Diagnosis not present

## 2019-09-19 DIAGNOSIS — E1340 Other specified diabetes mellitus with diabetic neuropathy, unspecified: Secondary | ICD-10-CM | POA: Diagnosis not present

## 2019-09-19 DIAGNOSIS — L03313 Cellulitis of chest wall: Secondary | ICD-10-CM | POA: Diagnosis not present

## 2019-09-19 DIAGNOSIS — F411 Generalized anxiety disorder: Secondary | ICD-10-CM | POA: Diagnosis not present

## 2019-09-19 DIAGNOSIS — I10 Essential (primary) hypertension: Secondary | ICD-10-CM | POA: Diagnosis not present

## 2019-09-20 DIAGNOSIS — E139 Other specified diabetes mellitus without complications: Secondary | ICD-10-CM | POA: Diagnosis not present

## 2019-09-20 DIAGNOSIS — E109 Type 1 diabetes mellitus without complications: Secondary | ICD-10-CM | POA: Diagnosis not present

## 2019-09-21 ENCOUNTER — Emergency Department
Admission: EM | Admit: 2019-09-21 | Discharge: 2019-09-21 | Disposition: A | Discharge status: Home / Self Care | Payer: Medicare HMO | Attending: Emergency Medicine | Admitting: Emergency Medicine

## 2019-09-21 DIAGNOSIS — M542 Cervicalgia: Secondary | ICD-10-CM | POA: Insufficient documentation

## 2019-09-21 DIAGNOSIS — M25519 Pain in unspecified shoulder: Secondary | ICD-10-CM | POA: Diagnosis not present

## 2019-09-21 DIAGNOSIS — M549 Dorsalgia, unspecified: Secondary | ICD-10-CM | POA: Diagnosis not present

## 2019-09-21 DIAGNOSIS — Z5321 Procedure and treatment not carried out due to patient leaving prior to being seen by health care provider: Secondary | ICD-10-CM | POA: Diagnosis not present

## 2019-09-21 NOTE — ED Notes (Signed)
Amber Christensen, EDT called lab regarding collecting lab work.

## 2019-09-21 NOTE — ED Notes (Signed)
Lab called again regarding collection of labs.

## 2019-09-21 NOTE — ED Notes (Signed)
Lab called regarding collecting blood work.

## 2019-09-21 NOTE — ED Notes (Signed)
Lab called regarding coming to draw patient's labs.

## 2019-09-21 NOTE — ED Triage Notes (Signed)
Patient to ED at the request of Dr. Linton Ham PA because she is still having "severe pain" all over but mostly in her shoulder, back and neck. Patient has a port because she has "no veins left and we can't get access." Patient has chronic pancreatitis and is "here all the time".

## 2019-09-21 NOTE — ED Notes (Signed)
This RN to subwait to speak with patient. Pt states spoke with Dr. Ginette Pitman regarding admission and per Dr. Ginette Pitman pt was supposed be direct admit. This RN explained admission holds and wait times to patient and that patient was referred to ED patient would have had to go through triage process to be seen and admitted through ER process. This RN verified no direct admit orders placed. Pt states she is going to leave and wait for Dr. Ginette Pitman to call and let her know if she is going to [facility] or [facility] for direct admission. Pt offered wheelchair to lobby from sub wait, pt refused, states she will walk. Pt ambulatory from subwait to lobby to be picked up by her husband at this time.

## 2019-09-24 DIAGNOSIS — L03313 Cellulitis of chest wall: Secondary | ICD-10-CM | POA: Diagnosis not present

## 2019-09-24 DIAGNOSIS — E1065 Type 1 diabetes mellitus with hyperglycemia: Secondary | ICD-10-CM | POA: Diagnosis not present

## 2019-09-24 DIAGNOSIS — M310 Hypersensitivity angiitis: Secondary | ICD-10-CM | POA: Diagnosis not present

## 2019-09-24 DIAGNOSIS — Z95828 Presence of other vascular implants and grafts: Secondary | ICD-10-CM | POA: Diagnosis not present

## 2019-09-24 DIAGNOSIS — R509 Fever, unspecified: Secondary | ICD-10-CM | POA: Diagnosis not present

## 2019-09-24 DIAGNOSIS — M25511 Pain in right shoulder: Secondary | ICD-10-CM | POA: Diagnosis not present

## 2019-09-24 DIAGNOSIS — I776 Arteritis, unspecified: Secondary | ICD-10-CM | POA: Diagnosis not present

## 2019-09-24 DIAGNOSIS — K7581 Nonalcoholic steatohepatitis (NASH): Secondary | ICD-10-CM | POA: Diagnosis not present

## 2019-09-25 ENCOUNTER — Other Ambulatory Visit: Payer: Self-pay

## 2019-09-25 ENCOUNTER — Ambulatory Visit: Admission: RE | Admit: 2019-09-25 | Payer: Medicare HMO | Source: Ambulatory Visit

## 2019-09-25 MED ORDER — HEPARIN SOD (PORK) LOCK FLUSH 100 UNIT/ML IV SOLN
INTRAVENOUS | Status: AC
  Filled 2019-09-25: qty 5

## 2019-09-25 MED ORDER — HEPARIN SOD (PORK) LOCK FLUSH 100 UNIT/ML IV SOLN: 500.0000 [IU] | Freq: Once | INTRAVENOUS | Status: DC

## 2019-09-25 MED ORDER — SODIUM CHLORIDE 0.9% FLUSH: 10.0000 mL | Freq: Once | INTRAVENOUS | Status: DC

## 2019-09-25 NOTE — Progress Notes (Signed)
Patient states was in ED for possible infection near port site.  Port flushed and accessed 7/26.  Contacted Eulogio Ditch PA who stated to hold off on port flush today until after follow up with Dr. Lucky Cowboy.

## 2019-09-26 DIAGNOSIS — K582 Mixed irritable bowel syndrome: Secondary | ICD-10-CM | POA: Diagnosis not present

## 2019-09-26 DIAGNOSIS — R5382 Chronic fatigue, unspecified: Secondary | ICD-10-CM | POA: Diagnosis not present

## 2019-09-26 DIAGNOSIS — E1140 Type 2 diabetes mellitus with diabetic neuropathy, unspecified: Secondary | ICD-10-CM | POA: Diagnosis not present

## 2019-09-26 DIAGNOSIS — E139 Other specified diabetes mellitus without complications: Secondary | ICD-10-CM | POA: Diagnosis not present

## 2019-09-26 DIAGNOSIS — I10 Essential (primary) hypertension: Secondary | ICD-10-CM | POA: Diagnosis not present

## 2019-09-26 DIAGNOSIS — I776 Arteritis, unspecified: Secondary | ICD-10-CM | POA: Diagnosis not present

## 2019-09-27 DIAGNOSIS — M25511 Pain in right shoulder: Secondary | ICD-10-CM | POA: Diagnosis not present

## 2019-09-27 DIAGNOSIS — M75101 Unspecified rotator cuff tear or rupture of right shoulder, not specified as traumatic: Secondary | ICD-10-CM | POA: Diagnosis not present

## 2019-10-02 ENCOUNTER — Other Ambulatory Visit: Payer: Self-pay

## 2019-10-02 ENCOUNTER — Encounter: Payer: Self-pay | Admitting: Gastroenterology

## 2019-10-02 ENCOUNTER — Other Ambulatory Visit
Admission: RE | Admit: 2019-10-02 | Discharge: 2019-10-02 | Disposition: A | Discharge status: Home / Self Care | Payer: Medicare HMO | Attending: Gastroenterology | Admitting: Gastroenterology

## 2019-10-02 ENCOUNTER — Ambulatory Visit (INDEPENDENT_AMBULATORY_CARE_PROVIDER_SITE_OTHER): Payer: Medicare HMO | Admitting: Gastroenterology

## 2019-10-02 VITALS — BP 145/85 | HR 80 | Temp 96.9°F | Ht 64.0 in | Wt 155.8 lb

## 2019-10-02 DIAGNOSIS — K7581 Nonalcoholic steatohepatitis (NASH): Secondary | ICD-10-CM | POA: Insufficient documentation

## 2019-10-02 DIAGNOSIS — I700 Atherosclerosis of aorta: Secondary | ICD-10-CM | POA: Insufficient documentation

## 2019-10-02 DIAGNOSIS — R109 Unspecified abdominal pain: Secondary | ICD-10-CM | POA: Insufficient documentation

## 2019-10-02 DIAGNOSIS — N3289 Other specified disorders of bladder: Secondary | ICD-10-CM | POA: Insufficient documentation

## 2019-10-02 DIAGNOSIS — K580 Irritable bowel syndrome with diarrhea: Secondary | ICD-10-CM | POA: Diagnosis not present

## 2019-10-02 DIAGNOSIS — L509 Urticaria, unspecified: Secondary | ICD-10-CM | POA: Insufficient documentation

## 2019-10-02 DIAGNOSIS — R748 Abnormal levels of other serum enzymes: Secondary | ICD-10-CM | POA: Insufficient documentation

## 2019-10-02 DIAGNOSIS — G43909 Migraine, unspecified, not intractable, without status migrainosus: Secondary | ICD-10-CM | POA: Insufficient documentation

## 2019-10-02 DIAGNOSIS — K589 Irritable bowel syndrome without diarrhea: Secondary | ICD-10-CM | POA: Insufficient documentation

## 2019-10-02 DIAGNOSIS — E162 Hypoglycemia, unspecified: Secondary | ICD-10-CM | POA: Insufficient documentation

## 2019-10-02 DIAGNOSIS — N63 Unspecified lump in unspecified breast: Secondary | ICD-10-CM | POA: Insufficient documentation

## 2019-10-02 LAB — HEPATIC FUNCTION PANEL
ALT: 59 U/L — ABNORMAL HIGH (ref 0–44)
AST: 34 U/L (ref 15–41)
Albumin: 4.7 g/dL (ref 3.5–5.0)
Alkaline Phosphatase: 142 U/L — ABNORMAL HIGH (ref 38–126)
Bilirubin, Direct: 0.3 mg/dL — ABNORMAL HIGH (ref 0.0–0.2)
Indirect Bilirubin: 3 mg/dL — ABNORMAL HIGH (ref 0.3–0.9)
Total Bilirubin: 3.3 mg/dL — ABNORMAL HIGH (ref 0.3–1.2)
Total Protein: 8.2 g/dL — ABNORMAL HIGH (ref 6.5–8.1)

## 2019-10-02 MED ORDER — ALOSETRON HCL 1 MG PO TABS
1.0000 mg | ORAL_TABLET | Freq: Two times a day (BID) | ORAL | 5 refills | Status: DC
Start: 2019-10-02 — End: 2019-10-07

## 2019-10-02 NOTE — Progress Notes (Signed)
Gastroenterology Consultation  Referring Provider:     [name], MD Primary Care Physician:  [name], MD Primary Gastroenterologist:  Dr. Allen Norris     Reason for Consultation:     Irritable bowel syndrome with diarrhea and incontinence        HPI:   Amber Christensen is a 54 y.o. y/o female referred for consultation & management of irritable bowel syndrome with diarrhea and incontinence by Dr. [name], MD.  This patient comes in today after being seen at the [facility] clinic since 2016 multiple times with her last visit a month ago on July 14.  At that time it was noted that the patient was scheduled for an upper endoscopy at [facility] the following week and was recommended to follow-up with them.  The patient's endoscopy history includes:  Endoscopic History: Last EGD 01/04/19- abdominal pain/rule out esophageal varices-[facility]- normal esophagus, gastric polyps consistent with fundic gland polyps. Normal duodenum. There was no h pylori, dysplasia, malignancy Last colonoscopy 2020- Dr Gwenlyn Perking- one adenomatous polyp. Patient ileocolonic anastomosis. Diverticulosis, hemorrhoids. EGD 06/09/16- [facility]- grade 1 varices, small hiatal hernia, fundic gland polyps.  Viirtual Colonoscooy 08/21/14- No polypoid mass. Some rectosimgoid thickening; patent right anastomosis EGD 09/25/13- Dr Skulskie(epigastric pain/[place])- [place] grade C esohagitis. Normal duodenum. Prominent gastric vessels not consistent with varices.  The patient is reported to be taking Creon and dicyclomine with 12-13 bowel movements a day which are nonbloody.  It is reported that she has 5-6 episodes of diarrhea just in the morning.  The patient also had a CT scan of the abdomen that showed:  CT Abdomen/pelvis 07/26/19 IMPRESSION:  1. Mild perivesicular fat stranding, can be seen with cystitis.  2. Moderate colonic stool burden with colonic tortuosity, suggesting  constipation. No bowel obstruction or inflammation.  3. Hepatic  steatosis.  Aortic Atherosclerosis (ICD10-I70.0).  The patient is also followed at [facility] for nonalcoholic fatty liver disease.  She has been diagnosed with EPI and GERD.  The recommendations from her previous visit included continuing her dicyclomine and sending her off for a hydrogen breath test for small intestinal bacterial overgrowth.  She was also recommended to have stool sample sent off and a trial of Imodium.  There was some consideration of trying a tricyclic antidepressant.  The patient's C-reactive protein was not elevated and her GI panel was negative for any pathogens.  The patient also had liver enzymes taken that showed her to have elevated liver enzymes.  The patient reports that she had an ERCP back in [place] many years ago when this all started and at that time she had an ERCP and complications of pancreatitis with admission to the ICU and she said that she had multiple drains placed at that time.  She reports that she had something called a "panectomy" but with further conversation I think she is talking about a pancreatectomy.  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  The patient has not been helped much with the dicyclomine and she also states that very little things make her symptoms any better or worse such as eating drinking or moving her bowels.  The patient had her gallbladder out in the past.  She also reports that she has been having more nausea recently.  She is presently on Dexilant for reflux and her history of esophagitis.  Past Medical History:  Diagnosis Date  . Allergy    many many allergies  . Anemia    in past  .  Anginal pain ([place]) 01/2018   cardiac workup clear  . Anxiety   . 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[place])    pt unaware of this diagnosis  . Bipolar disorder ([place])   . Bladder mass    removed and had tbt. mesh removed  . Blood transfusion without reported diagnosis 1999   received 20 units of blood after tearing  esophagus from vomitting so severely after ERCP  . Breast mass    left side biopsy was negative  . Carpal tunnel syndrome    left hand  . Cervical cancer ([place]) 2002  . Chronic kidney disease    cyst on left kidney  . Chronic pancreatitis ([place])    prior to pancreas surgery  . Cirrhosis ([place])   . Clotting disorder ([place])    pt states that sometimes she has difficulty stopping to bleed and other times it is okay  . Depression   . Diabetes mellitus without complication (HCC)    has insulin pump  . Dyspnea   . Esophagitis   . GERD (gastroesophageal reflux disease)   . Headache    migraines...takes compazine for this  . Heart disease   . 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 Hypertension   . Irritable bowel syndrome   . Kidney mass    just watching. left kidney mass.  . Liver disease    NASH. has had liver biopsies. negative except for NASH  . NASH (nonalcoholic steatohepatitis)   . NASH (nonalcoholic steatohepatitis)   . Pancreatitis   . PONV (postoperative nausea and vomiting)   . Stroke ([place]) 11/2017   had tia. no longer needs plavix  . Thyroid disease     Past Surgical History:  Procedure Laterality Date  . ABDOMINAL HYSTERECTOMY    . APPENDECTOMY    . BLADDER SURGERY     mesh removed. revision which has caused everything to fall again  . BOWEL RESECTION     took a piece of bowel out after pancreatectomy caused problem with bowel.   Marland Kitchen BREAST BIOPSY Left 1989   benign  . CARDIAC CATHETERIZATION  8413,2440, 1998   no stents, fatty streaks  . CHOLECYSTECTOMY    . COLON SURGERY    . COLONOSCOPY WITH PROPOFOL N/A 10/08/2018   Procedure: COLONOSCOPY WITH PROPOFOL;  Surgeon: Lollie Sails, MD;  Location: [facility] ENDOSCOPY;  Service: Endoscopy;  Laterality: N/A;  . CYST REMOVAL HAND Left 1992   Ganglion cyst removed from Left Wrist  . CYSTECTOMY    . CYSTO WITH HYDRODISTENSION N/A 03/04/2019    Procedure: CYSTOSCOPY/HYDRODISTENSION;  Surgeon: Bjorn Loser, MD;  Location: [facility] ORS;  Service: Urology;  Laterality: N/A;  . DILATATION & CURETTAGE/HYSTEROSCOPY WITH MYOSURE    . DILATION AND CURETTAGE OF UTERUS    . ERCP    . ESOPHAGOGASTRODUODENOSCOPY    . HERNIA REPAIR    . KNEE ARTHROSCOPY Left 1992  . LEFT HEART CATH AND CORONARY ANGIOGRAPHY Left 02/06/2019   Procedure: LEFT HEART CATH AND CORONARY ANGIOGRAPHY;  Surgeon: Yolonda Kida, MD;  Location: [place] CV LAB;  Service: Cardiovascular;  Laterality: Left;  . LIVER BIOPSY    . PANCREAS SURGERY  2006   partial pancreatectomy after endoscope knicked a part of pancreas.   Marland Kitchen PORTA CATH INSERTION N/A 04/10/2017   Procedure: PORTA CATH INSERTION;  Surgeon: Algernon Huxley, MD;  Location: [place] CV LAB;  Service: Cardiovascular;  Laterality: N/A;  . PORTA CATH INSERTION N/A 02/21/2019   Procedure: PORTA CATH INSERTION;  Surgeon: Algernon Huxley, MD;  Location: [place] CV LAB;  Service: Cardiovascular;  Laterality: N/A;  . REPAIR OF ESOPHAGUS  2012   3 clamps placed in esophagus  . ROBOTIC ASSISTED LAPAROSCOPIC VENTRAL/INCISIONAL HERNIA REPAIR N/A 03/15/2018   Procedure: ROBOTIC ASSISTED LAPAROSCOPIC VENTRAL HERNIA REPAIR;  Surgeon: Jules Husbands, MD;  Location: [facility] ORS;  Service: General;  Laterality: N/A;  . SMALL BOWEL REPAIR    . TONSILLECTOMY      Prior to Admission medications   Medication Sig Start Date End Date Taking? Authorizing Provider  amLODipine (NORVASC) 5 MG tablet Take 5 mg by mouth daily.    [provider]  Calcium Carb-Cholecalciferol (CALCIUM 600/VITAMIN D3 PO) Take 1 tablet by mouth daily.    [provider]  Cetirizine HCl 10 MG CAPS Take 1 capsule by mouth daily in the afternoon.    [provider]  Cholecalciferol (VITAMIN D-3) 5000 units TABS Take 5,000 Units by mouth daily.    [provider]  Dexlansoprazole 30 MG capsule Take 30 mg by mouth daily.     [provider]  EPINEPHrine 0.3 mg/0.3 mL IJ SOAJ injection SMARTSIG:1 Pre-Filled Pen Syringe IM PRN 05/31/19   [provider]  gabapentin (NEURONTIN) 300 MG capsule Take 600 mg by mouth 3 (three) times daily. 08/30/19   [provider]  insulin lispro (HUMALOG) 100 UNIT/ML injection USE UP TO 70 UNITS SUBCUTANEOUSLY PER DAY VIA INSULIN PUMP. Dx code e10.65 05/14/19   [provider]  lipase/protease/amylase (CREON) 36000 UNITS CPEP capsule Take 144,000 Units by mouth 3 (three) times daily before meals.     [provider]  lisinopril (ZESTRIL) 40 MG tablet Take 40 mg by mouth daily. 09/03/18   [provider]  Magnesium (V-R MAGNESIUM) 250 MG TABS Take 250 mg by mouth daily.     [provider]  metoprolol succinate (TOPROL-XL) 25 MG 24 hr tablet Take 25 mg by mouth daily.    [provider]  MITIGARE 0.6 MG CAPS Take 1 capsule by mouth 2 (two) times daily. 04/09/19   [provider]  NOVOLOG 100 UNIT/ML injection SMARTSIG:0-70 Unit(s) SUB-Q Daily 08/23/19   [provider]  oxyCODONE-acetaminophen (PERCOCET) 5-325 MG tablet Take 1 tablet by mouth every 8 (eight) hours as needed. 09/09/19   Earleen Newport, MD  pantoprazole (PROTONIX) 40 MG tablet Take 1 tablet (40 mg total) by mouth daily. 07/26/19 07/25/20  Nena Polio, MD  solifenacin (VESICARE) 5 MG tablet Take 1 tablet (5 mg total) by mouth daily. 05/06/19   Bjorn Loser, MD  vitamin E 400 UNIT capsule Take 400 Units by mouth daily.    [provider]    Family History  Problem Relation Age of Onset  . Cirrhosis Mother   . Colon cancer Mother 40       TAH/BSO in ~25; deceased at 67  . Hypertension Mother   . Breast cancer Mother 37       unconfirmed  . Hypertension Father   . Prostate cancer Father 71       currently 21  . Other Father        liver disease  . Breast cancer Maternal Aunt        age at dx unk.; currently 47  .  Cervical cancer Maternal Aunt   . Cancer Maternal Uncle  unk. type; currently 42s  . Stomach cancer Paternal Aunt 54       deceased at 75  . Breast cancer Maternal Grandmother 80       possibly bilateral in 50s; deceased 67  . Non-Hodgkin's lymphoma Daughter        unconfirmed; currently 36  . Cancer Maternal Aunt        hematologic malignancy; deceased 33     Social History   Tobacco Use  . Smoking status: Never Smoker  . Smokeless tobacco: Never Used  Vaping Use  . Vaping Use: Never used  Substance Use Topics  . Alcohol use: Yes    Comment: rarely  . Drug use: No    Allergies as of 10/02/2019 - Review Complete 09/21/2019  Allergen Reaction Noted  . Ciprofloxacin  02/06/2019  . Demerol [meperidine] Hives and Other (See Comments) 07/17/2014  . Dilaudid [hydromorphone hcl] Nausea And Vomiting and Other (See Comments) 02/24/2015  . Metoclopramide Hives 07/21/2014  . Morphine and related Anaphylaxis and Other (See Comments) 07/17/2014  . Morpholine salicylate Nausea And Vomiting, Other (See Comments), and Rash 10/28/2013  . Isosorbide nitrate Other (See Comments) 02/05/2014  . Sulfa antibiotics Nausea And Vomiting 07/30/2014  . Flagyl [metronidazole]  02/06/2019  . Adhesive [tape] Rash 03/09/2018  . Darvon [propoxyphene] Nausea And Vomiting and Rash 02/24/2015  . Flexeril [cyclobenzaprine] Nausea And Vomiting and Rash 07/17/2014  . Levaquin [levofloxacin in d5w] Nausea And Vomiting and Rash 07/17/2014  . Naproxen Rash 03/06/2018  . Soma [carisoprodol] Nausea And Vomiting and Rash 07/17/2014  . Zofran [ondansetron hcl] Nausea And Vomiting and Rash 07/17/2014    Review of Systems:    All systems reviewed and negative except where noted in HPI.   Physical Exam:  There were no vitals taken for this visit. No LMP recorded. Patient has had a hysterectomy. General:   Alert,  Well-developed, well-nourished, pleasant and cooperative in NAD Head:  Normocephalic and  atraumatic. Eyes:  Sclera clear, no icterus.   Conjunctiva pink. Ears:  Normal auditory acuity. Neck:  Supple; no masses or thyromegaly. Lungs:  Respirations even and unlabored.  Clear throughout to auscultation.   No wheezes, crackles, or rhonchi. No acute distress. Heart:  Regular rate and rhythm; no murmurs, clicks, rubs, or gallops. Abdomen:  Normal bowel sounds.  No bruits.  Soft, non-tender and non-distended without masses, hepatosplenomegaly or hernias noted.  No guarding or rebound tenderness.  Negative Carnett sign.   Rectal:  Deferred.  Pulses:  Normal pulses noted. Extremities:  No clubbing or edema.  No cyanosis. Neurologic:  Alert and oriented x3;  grossly normal neurologically. Skin:  Intact without significant lesions or rashes.  No jaundice. Lymph Nodes:  No significant cervical adenopathy. Psych:  Alert and cooperative. Normal mood and affect.  Imaging Studies: DG Chest 2 View  Result Date: 09/09/2019 CLINICAL DATA:  Chest soft tissue infection at port site. EXAM: CHEST - 2 VIEW COMPARISON:  07/26/2019 FINDINGS: The heart size and mediastinal contours are within normal limits. Both lungs are clear. Right-sided Port-A-Cath is again seen in appropriate position, with distal tip overlying expected location of the superior cavoatrial junction. IMPRESSION: Right-sided Port-A-Cath remains in appropriate position. No active cardiopulmonary disease. Electronically Signed   By: Marlaine Hind M.D.   On: 09/09/2019 13:34   DG Shoulder Right  Result Date: 09/09/2019 CLINICAL DATA:  Right shoulder pain and decreased range of motion. No known injury. EXAM: RIGHT SHOULDER - 2+ VIEW COMPARISON:  None. FINDINGS: There is  no evidence of fracture or dislocation. There is no evidence of arthropathy or other focal bone abnormality. Right-sided Port-A-Cath is seen within the right chest wall soft tissues. Soft tissues are otherwise unremarkable. IMPRESSION: Negative. Electronically Signed   By: Marlaine Hind M.D.   On: 09/09/2019 13:35   US Venous Img Upper Uni Right(DVT)  Result Date: 09/09/2019 CLINICAL DATA:  Right upper arm pain and edema. Indwelling right-sided chest Port-A-Cath. EXAM: RIGHT UPPER EXTREMITY VENOUS DOPPLER ULTRASOUND TECHNIQUE: Gray-scale sonography with graded compression, as well as color Doppler and duplex ultrasound were performed to evaluate the upper extremity deep venous system from the level of the subclavian vein and including the jugular, axillary, basilic, radial, ulnar and upper cephalic vein. Spectral Doppler was utilized to evaluate flow at rest and with distal augmentation maneuvers. COMPARISON:  None. FINDINGS: Contralateral Subclavian Vein: Respiratory phasicity is normal and symmetric with the symptomatic side. No evidence of thrombus. Normal compressibility. Internal Jugular Vein: No evidence of thrombus. Normal compressibility, respiratory phasicity and response to augmentation. Subclavian Vein: No evidence of thrombus. Normal compressibility, respiratory phasicity and response to augmentation. Axillary Vein: No evidence of thrombus. Normal compressibility, respiratory phasicity and response to augmentation. Cephalic Vein: No evidence of thrombus. Normal compressibility, respiratory phasicity and response to augmentation. Basilic Vein: No evidence of thrombus. Normal compressibility, respiratory phasicity and response to augmentation. Brachial Veins: No evidence of thrombus. Normal compressibility, respiratory phasicity and response to augmentation. Radial Veins: No evidence of thrombus. Normal compressibility, respiratory phasicity and response to augmentation. Ulnar Veins: No evidence of thrombus. Normal compressibility, respiratory phasicity and response to augmentation. Venous Reflux:  None visualized. Other Findings: No evidence of superficial thrombophlebitis or abnormal fluid collection. IMPRESSION: No evidence of DVT within the right upper extremity.  Electronically Signed   By: Aletta Edouard M.D.   On: 09/09/2019 14:35    Assessment and Plan:   AYANNAH FADDIS is a 54 y.o. y/o female who comes in today with a longstanding history of multiple GI problems including diarrhea, abdominal pain, nausea with some stool incontinence.  The patient also has had necrotizing pancreatitis and is now on pancreatic replacement therapy.  Her symptoms do not appear to be improved with her Dexilant, Creon or dicyclomine.  She has also reported a 10 pound weight loss in the last month.  The patient will have her stools checked for fecal fat to see if she is taking adequate amounts of replacement therapy.  She will also be started on Lotronex since she cannot take Viberzi seeing that she had her gallbladder out in the past.  The patient now tells me that she would also like to switch her liver care from [facility] to me and she will have her liver enzymes checked in addition to possible other causes of abnormal liver enzymes.  They had been much lower previously.  She also had stated that somebody in [place] stated that she needed a liver biopsy every 3 years but does not know why they had said that.  The patient has been told that we are not planning on any repeat liver biopsy at this time and we will see how she does on the Lotronex and she should continue her present treatment.  If her symptoms do not improve we talked about a tricyclic antidepressant and even Lomotil in the future.  The patient has been explained the plan agrees with it.    Lucilla Lame, MD. Marval Regal    Note: This dictation was prepared with Viviann Spare  dictation along with smaller phrase technology. Any transcriptional errors that result from this process are unintentional.

## 2019-10-03 ENCOUNTER — Telehealth: Payer: Self-pay | Admitting: Gastroenterology

## 2019-10-03 DIAGNOSIS — E139 Other specified diabetes mellitus without complications: Secondary | ICD-10-CM | POA: Diagnosis not present

## 2019-10-03 DIAGNOSIS — I10 Essential (primary) hypertension: Secondary | ICD-10-CM | POA: Diagnosis not present

## 2019-10-03 DIAGNOSIS — K219 Gastro-esophageal reflux disease without esophagitis: Secondary | ICD-10-CM | POA: Diagnosis not present

## 2019-10-03 DIAGNOSIS — R945 Abnormal results of liver function studies: Secondary | ICD-10-CM | POA: Diagnosis not present

## 2019-10-03 DIAGNOSIS — R112 Nausea with vomiting, unspecified: Secondary | ICD-10-CM | POA: Diagnosis not present

## 2019-10-03 DIAGNOSIS — K861 Other chronic pancreatitis: Secondary | ICD-10-CM | POA: Diagnosis not present

## 2019-10-03 DIAGNOSIS — Z79899 Other long term (current) drug therapy: Secondary | ICD-10-CM | POA: Diagnosis not present

## 2019-10-03 NOTE — Telephone Encounter (Signed)
Patient called and stated that she has left 2 messages on CMA Ginger voicemail and hasn't received a call back..She said that after her Doctor's appt. With Dr.Wohl on 10/02/19 , she stopped at [facility] to eat and vomited 3x yesterday and 1x this morning. Also the medication Alostron that Dr. Allen Norris prescribed, her insurance doesn't cover it. She wants Dr. Allen Norris or Ginger to call her back. Per patient..Clinical Staff was informed.

## 2019-10-04 ENCOUNTER — Other Ambulatory Visit: Payer: Self-pay

## 2019-10-04 MED ORDER — DIPHENOXYLATE-ATROPINE 2.5-0.025 MG PO TABS
1.0000 | ORAL_TABLET | Freq: Four times a day (QID) | ORAL | 3 refills | Status: DC | PRN
Start: 2019-10-04 — End: 2020-01-15

## 2019-10-07 NOTE — Telephone Encounter (Signed)
Pt notified of results. Pt stated she is no longer vomiting. Discussed with Dr. Allen Norris the [employer] is not covered under pt's plan. He has advised to switch it to Lomotil. Rx has been called into pt's pharmacy.

## 2019-10-07 NOTE — Telephone Encounter (Signed)
-----   Message from Lucilla Lame, MD sent at 10/02/2019 10:40 AM EDT ----- Let the patient know that her liver enzymes are still elevated but it appears that one of her liver enzymes, bilirubin, is not from a liver source and is not something of concern.  The patient should continue to try and lose weight and avoid liver toxic substances such as alcohol and anti-inflammatory medication.

## 2019-10-08 ENCOUNTER — Ambulatory Visit (INDEPENDENT_AMBULATORY_CARE_PROVIDER_SITE_OTHER): Payer: Medicare HMO | Admitting: Vascular Surgery

## 2019-10-08 ENCOUNTER — Encounter (INDEPENDENT_AMBULATORY_CARE_PROVIDER_SITE_OTHER): Payer: Self-pay | Admitting: Vascular Surgery

## 2019-10-08 ENCOUNTER — Other Ambulatory Visit: Payer: Self-pay

## 2019-10-08 VITALS — BP 125/81 | HR 68 | Resp 16 | Ht 64.0 in | Wt 152.0 lb

## 2019-10-08 DIAGNOSIS — Z95828 Presence of other vascular implants and grafts: Secondary | ICD-10-CM | POA: Diagnosis not present

## 2019-10-08 DIAGNOSIS — E119 Type 2 diabetes mellitus without complications: Secondary | ICD-10-CM

## 2019-10-08 DIAGNOSIS — I878 Other specified disorders of veins: Secondary | ICD-10-CM | POA: Diagnosis not present

## 2019-10-08 DIAGNOSIS — I10 Essential (primary) hypertension: Secondary | ICD-10-CM

## 2019-10-08 NOTE — Assessment & Plan Note (Signed)
At current, the port does not appear inflamed or infected in any way.  No tenderness or erythema is currently present.  She did not have bacteremia that I saw.  At current, I would use her port but with some caution and continue to monitor the area.  Not need continued antibiotics from my point of view.

## 2019-10-08 NOTE — Assessment & Plan Note (Signed)
blood glucose control important in reducing the progression of atherosclerotic disease. Also, involved in wound healing. On appropriate medications.  

## 2019-10-08 NOTE — Assessment & Plan Note (Signed)
With her multiple medical issues this was the reason for the port placement initially.  She still needs the port indefinitely.

## 2019-10-08 NOTE — Assessment & Plan Note (Signed)
blood pressure control important in reducing the progression of atherosclerotic disease. On appropriate oral medications.  

## 2019-10-08 NOTE — Progress Notes (Signed)
MRN : [id number]  Amber Christensen is a 54 y.o. (Jul 12, 1965) female who presents with chief complaint of  Chief Complaint  Patient presents with  . Follow-up    add on seen at ED cellulitis of chest wall  .  History of Present Illness: Patient returns today in follow up of her right sided Port-A-Cath.  About a month ago, she went to the ER and had cellulitis in the area but also appeared to have some sort of bursitis and right rotator cuff injury.  She got a cortisone injection in her right shoulder as well as some antibiotics and the redness has improved.  The port is not tender.  It has no redness or inflammation surrounding the port.  It has still been working although they put it on hold over the past couple of weeks.  He has been seeing Dr. Ola Spurr from infectious disease as well as her primary care physician who will help manage her.  She is having liver issues and appears to have some sort of autoimmune disease that has not been fully elucidated  Current Outpatient Medications  Medication Sig Dispense Refill  . amLODipine (NORVASC) 5 MG tablet Take 5 mg by mouth daily.    . Calcium Carb-Cholecalciferol (CALCIUM 600/VITAMIN D3 PO) Take 1 tablet by mouth daily.    . Cetirizine HCl 10 MG CAPS Take 1 capsule by mouth daily in the afternoon.    . Cholecalciferol (VITAMIN D-3) 5000 units TABS Take 5,000 Units by mouth daily.    . Continuous Blood Gluc Receiver (DEXCOM G6 RECEIVER) DEVI Use to monitor blood sugar. [place] [phone]    . CONTOUR NEXT TEST test strip 1 each 4 (four) times daily.    Marland Kitchen Dexlansoprazole 30 MG capsule Take 30 mg by mouth daily.    . diphenoxylate-atropine (LOMOTIL) 2.5-0.025 MG tablet Take 1 tablet by mouth 4 (four) times daily as needed for diarrhea or loose stools. 60 tablet 3  . EPINEPHrine 0.3 mg/0.3 mL IJ SOAJ injection SMARTSIG:1 Pre-Filled Pen Syringe IM PRN    . gabapentin (NEURONTIN) 300 MG capsule Take 600 mg by mouth 3 (three) times daily.    .  insulin lispro (HUMALOG) 100 UNIT/ML injection USE UP TO 70 UNITS SUBCUTANEOUSLY PER DAY VIA INSULIN PUMP. Dx code e10.65    . lipase/protease/amylase (CREON) 36000 UNITS CPEP capsule Take 144,000 Units by mouth 3 (three) times daily before meals.     Marland Kitchen lisinopril (ZESTRIL) 40 MG tablet Take 40 mg by mouth daily.    . Magnesium (V-R MAGNESIUM) 250 MG TABS Take 250 mg by mouth daily.     . metoprolol succinate (TOPROL-XL) 25 MG 24 hr tablet Take 25 mg by mouth daily.    Marland Kitchen MITIGARE 0.6 MG CAPS Take 1 capsule by mouth 2 (two) times daily.    Marland Kitchen NOVOLOG 100 UNIT/ML injection SMARTSIG:0-70 Unit(s) SUB-Q Daily    . oxyCODONE-acetaminophen (PERCOCET) 5-325 MG tablet Take 1 tablet by mouth every 8 (eight) hours as needed. 20 tablet 0  . pantoprazole (PROTONIX) 40 MG tablet Take 1 tablet (40 mg total) by mouth daily. 30 tablet 1  . potassium chloride (KLOR-CON) 10 MEQ tablet Take 10 mEq by mouth 2 (two) times daily.    . solifenacin (VESICARE) 5 MG tablet Take 1 tablet (5 mg total) by mouth daily. 30 tablet 11  . vitamin E 400 UNIT capsule Take 400 Units by mouth daily.     No current facility-administered medications for this visit.  Past Medical History:  Diagnosis Date  . Allergy    many many allergies  . Anemia    in past  . Anginal pain ([place]) 01/2018   cardiac workup clear  . Anxiety   . 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[place])    pt unaware of this diagnosis  . Bipolar disorder ([place])   . Bladder mass    removed and had tbt. mesh removed  . Blood transfusion without reported diagnosis 1999   received 20 units of blood after tearing esophagus from vomitting so severely after ERCP  . Breast mass    left side biopsy was negative  . Carpal tunnel syndrome    left hand  . Cervical cancer ([place]) 2002  . Chronic kidney disease    cyst on left kidney  . Chronic pancreatitis ([place])    prior to pancreas surgery  . Cirrhosis ([place])   . Clotting disorder ([place])    pt states that sometimes she has  difficulty stopping to bleed and other times it is okay  . Depression   . Diabetes mellitus without complication (HCC)    has insulin pump  . Dyspnea   . Esophagitis   . GERD (gastroesophageal reflux disease)   . Headache    migraines...takes compazine for this  . Heart disease   . 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 Hypertension   . Irritable bowel syndrome   . Kidney mass    just watching. left kidney mass.  . Liver disease    NASH. has had liver biopsies. negative except for NASH  . NASH (nonalcoholic steatohepatitis)   . NASH (nonalcoholic steatohepatitis)   . Pancreatitis   . PONV (postoperative nausea and vomiting)   . Stroke ([place]) 11/2017   had tia. no longer needs plavix  . Thyroid disease     Past Surgical History:  Procedure Laterality Date  . ABDOMINAL HYSTERECTOMY    . APPENDECTOMY    . BLADDER SURGERY     mesh removed. revision which has caused everything to fall again  . BOWEL RESECTION     took a piece of bowel out after pancreatectomy caused problem with bowel.   Marland Kitchen BREAST BIOPSY Left 1989   benign  . CARDIAC CATHETERIZATION  [id number], 1998   no stents, fatty streaks  . CHOLECYSTECTOMY    . COLON SURGERY    . COLONOSCOPY WITH PROPOFOL N/A 10/08/2018   Procedure: COLONOSCOPY WITH PROPOFOL;  Surgeon: Lollie Sails, MD;  Location: [facility] ENDOSCOPY;  Service: Endoscopy;  Laterality: N/A;  . CYST REMOVAL HAND Left 1992   Ganglion cyst removed from Left Wrist  . CYSTECTOMY    . CYSTO WITH HYDRODISTENSION N/A 03/04/2019   Procedure: CYSTOSCOPY/HYDRODISTENSION;  Surgeon: Bjorn Loser, MD;  Location: [facility] ORS;  Service: Urology;  Laterality: N/A;  . DILATATION & CURETTAGE/HYSTEROSCOPY WITH MYOSURE    . DILATION AND CURETTAGE OF UTERUS    . ERCP    . ESOPHAGOGASTRODUODENOSCOPY    . HERNIA REPAIR    . KNEE ARTHROSCOPY Left 1992  . LEFT HEART CATH AND CORONARY ANGIOGRAPHY Left  02/06/2019   Procedure: LEFT HEART CATH AND CORONARY ANGIOGRAPHY;  Surgeon: Yolonda Kida, MD;  Location: [place] CV LAB;  Service: Cardiovascular;  Laterality: Left;  . LIVER BIOPSY    . PANCREAS SURGERY  2006   partial pancreatectomy after endoscope knicked a part of pancreas.   Marland Kitchen PORTA CATH  INSERTION N/A 04/10/2017   Procedure: PORTA CATH INSERTION;  Surgeon: Algernon Huxley, MD;  Location: [place] CV LAB;  Service: Cardiovascular;  Laterality: N/A;  . PORTA CATH INSERTION N/A 02/21/2019   Procedure: PORTA CATH INSERTION;  Surgeon: Algernon Huxley, MD;  Location: [place] CV LAB;  Service: Cardiovascular;  Laterality: N/A;  . REPAIR OF ESOPHAGUS  2012   3 clamps placed in esophagus  . ROBOTIC ASSISTED LAPAROSCOPIC VENTRAL/INCISIONAL HERNIA REPAIR N/A 03/15/2018   Procedure: ROBOTIC ASSISTED LAPAROSCOPIC VENTRAL HERNIA REPAIR;  Surgeon: Jules Husbands, MD;  Location: [facility] ORS;  Service: General;  Laterality: N/A;  . SMALL BOWEL REPAIR    . TONSILLECTOMY       Social History   Tobacco Use  . Smoking status: Never Smoker  . Smokeless tobacco: Never Used  Vaping Use  . Vaping Use: Never used  Substance Use Topics  . Alcohol use: Yes    Comment: rarely  . Drug use: No     Family History  Problem Relation Age of Onset  . Cirrhosis Mother   . Colon cancer Mother 7       TAH/BSO in ~27; deceased at 34  . Hypertension Mother   . Breast cancer Mother 68       unconfirmed  . Hypertension Father   . Prostate cancer Father 51       currently 45  . Other Father        liver disease  . Breast cancer Maternal Aunt        age at dx unk.; currently 62  . Cervical cancer Maternal Aunt   . Cancer Maternal Uncle        unk. type; currently 43s  . Stomach cancer Paternal Aunt 23       deceased at 61  . Breast cancer Maternal Grandmother 54       possibly bilateral in 36s; deceased 62  . Non-Hodgkin's lymphoma Daughter        unconfirmed; currently 75  . Cancer  Maternal Aunt        hematologic malignancy; deceased 24    Allergies  Allergen Reactions  . Ciprofloxacin     Rash, nausea vomitting  . Demerol [Meperidine] Hives and Other (See Comments)    Pt states that this medication causes cardiac arrest.    . Dilaudid [Hydromorphone Hcl] Nausea And Vomiting and Other (See Comments)    Pt states that this medication causes cardiac arrest.    . Metoclopramide Hives    Breathing issues  . Morphine And Related Anaphylaxis and Other (See Comments)    Pt states that this medication causes cardiac arrest  . Morpholine Salicylate Nausea And Vomiting, Other (See Comments) and Rash    CHF  . Isosorbide Nitrate Other (See Comments)    Headache   . Sulfa Antibiotics Nausea And Vomiting  . Flagyl [Metronidazole]     Rash, heart racing, nausea vomitting  . Adhesive [Tape] Rash    Blisters skin Paper tape is okay  . Darvon [Propoxyphene] Nausea And Vomiting and Rash  . Flexeril [Cyclobenzaprine] Nausea And Vomiting and Rash  . Levaquin [Levofloxacin In D5w] Nausea And Vomiting and Rash  . Naproxen Rash    Ibuprofen and advil are not a problem  . Soma [Carisoprodol] Nausea And Vomiting and Rash  . Zofran [Ondansetron Hcl] Nausea And Vomiting and Rash     REVIEW OF SYSTEMS (Negative unless checked)  Constitutional: [] Weight loss  [] Fever  [] Chills Cardiac: []   Chest pain   [] Chest pressure   [] Palpitations   [] Shortness of breath when laying flat   [] Shortness of breath at rest   [] Shortness of breath with exertion. Vascular:  [x] Pain in legs with walking   [x] Pain in legs at rest   [] Pain in legs when laying flat   [] Claudication   [] Pain in feet when walking  [] Pain in feet at rest  [] Pain in feet when laying flat   [] History of DVT   [] Phlebitis   [] Swelling in legs   [] Varicose veins   [] Non-healing ulcers Pulmonary:   [] Uses home oxygen   [] Productive cough   [] Hemoptysis   [] Wheeze  [] COPD   [] Asthma Neurologic:  [] Dizziness  [] Blackouts    [] Seizures   [] History of stroke   [] History of TIA  [] Aphasia   [] Temporary blindness   [] Dysphagia   [] Weakness or numbness in arms   [] Weakness or numbness in legs Musculoskeletal:  [x] Arthritis   [] Joint swelling   [x] Joint pain   [] Low back pain Hematologic:  [] Easy bruising  [] Easy bleeding   [] Hypercoagulable state   [] Anemic   Gastrointestinal:  [] Blood in stool   [] Vomiting blood  [x] Gastroesophageal reflux/heartburn   [x] Abdominal pain Genitourinary:  [] Chronic kidney disease   [] Difficult urination  [] Frequent urination  [] Burning with urination   [] Hematuria Skin:  [] Rashes   [] Ulcers   [] Wounds Psychological:  [] History of anxiety   []  History of major depression.  Physical Examination  BP 125/81 (BP Location: Left Arm)   Pulse 68   Resp 16   Ht 5' 4"  (1.626 m)   Wt 152 lb (68.9 kg)   BMI 26.09 kg/m  Gen:  WD/WN, NAD Head: [place]/AT, No temporalis wasting. Ear/Nose/Throat: Hearing grossly intact, nares w/o erythema or drainage Eyes: Conjunctiva clear. Sclera non-icteric Neck: Supple.  Trachea midline Pulmonary:  Good air movement, no use of accessory muscles.  Cardiac: RRR, no JVD Vascular: Port as described above.  It is not tender to palpation.  There is no redness or drainage. Vessel Right Left  Radial Palpable Palpable                       Musculoskeletal: M/S 5/5 throughout.  No deformity or atrophy.  No edema. Neurologic: Sensation grossly intact in extremities.  Symmetrical.  Speech is fluent.  Psychiatric: Judgment intact, Mood & affect appropriate for pt's clinical situation. Dermatologic: No rashes or ulcers noted.  No cellulitis or open wounds.       Labs Recent Results (from the past 2160 hour(s))  Basic metabolic panel     Status: Abnormal   Collection Time: 07/26/19  1:39 PM  Result Value Ref Range   Sodium 136 135 - 145 mmol/L   Potassium 2.9 (L) 3.5 - 5.1 mmol/L   Chloride 100 98 - 111 mmol/L   CO2 25 22 - 32 mmol/L   Glucose, Bld 294 (H)  70 - 99 mg/dL    Comment: Glucose reference range applies only to samples taken after fasting for at least 8 hours.   BUN 9 6 - 20 mg/dL   Creatinine, Ser 0.83 0.44 - 1.00 mg/dL   Calcium 7.7 (L) 8.9 - 10.3 mg/dL   GFR calc non Af Amer >60 >60 mL/min   GFR calc Af Amer >60 >60 mL/min   Anion gap 11 5 - 15    Comment: Performed at [facility], [address] [place], [place] [address]  CBC  Status: None   Collection Time: 07/26/19  1:39 PM  Result Value Ref Range   WBC 5.3 4.0 - 10.5 K/uL   RBC 4.40 3.87 - 5.11 MIL/uL   Hemoglobin 13.1 12.0 - 15.0 g/dL   HCT 36.5 36 - 46 %   MCV 83.0 80.0 - 100.0 fL   MCH 29.8 26.0 - 34.0 pg   MCHC 35.9 30.0 - 36.0 g/dL   RDW 12.6 11.5 - 15.5 %   Platelets 157 150 - 400 K/uL   nRBC 0.0 0.0 - 0.2 %    Comment: Performed at [facility], [address] [place], [place] [address]  Troponin I (High Sensitivity)     Status: None   Collection Time: 07/26/19  1:39 PM  Result Value Ref Range   Troponin I (High Sensitivity) 4 <18 ng/L    Comment: (NOTE) Elevated high sensitivity troponin I (hsTnI) values and significant  changes across serial measurements may suggest ACS but many other  chronic and acute conditions are known to elevate hsTnI results.  Refer to the "Links" section for chest pain algorithms and additional  guidance. Performed at [facility], [address] [place], [place] [address]   Lipase, blood     Status: None   Collection Time: 07/26/19  1:39 PM  Result Value Ref Range   Lipase 18 11 - 51 U/L    Comment: Performed at [facility], [address] [place], [place] [address]  Urinalysis, Complete w Microscopic     Status: Abnormal   Collection Time: 07/26/19  1:42 PM  Result Value Ref Range   Color, Urine YELLOW (A) YELLOW   APPearance HAZY (A) CLEAR   Specific Gravity, Urine 1.010 1.005 - 1.030   pH 5.0 5.0 - 8.0   Glucose, UA >=500 (A) NEGATIVE mg/dL   Hgb urine dipstick  NEGATIVE NEGATIVE   Bilirubin Urine NEGATIVE NEGATIVE   Ketones, ur NEGATIVE NEGATIVE mg/dL   Protein, ur NEGATIVE NEGATIVE mg/dL   Nitrite NEGATIVE NEGATIVE   Leukocytes,Ua NEGATIVE NEGATIVE   WBC, UA 0-5 0 - 5 WBC/hpf   Bacteria, UA RARE (A) NONE SEEN   Squamous Epithelial / LPF 0-5 0 - 5    Comment: Performed at [facility], [place], [place] [address]  Troponin I (High Sensitivity)     Status: None   Collection Time: 07/26/19  7:15 PM  Result Value Ref Range   Troponin I (High Sensitivity) 4 <18 ng/L    Comment: (NOTE) Elevated high sensitivity troponin I (hsTnI) values and significant  changes across serial measurements may suggest ACS but many other  chronic and acute conditions are known to elevate hsTnI results.  Refer to the "Links" section for chest pain algorithms and additional  guidance. Performed at [facility], [address] [place], [place] [address]   Hepatic function panel     Status: Abnormal   Collection Time: 07/26/19  7:15 PM  Result Value Ref Range   Total Protein 6.7 6.5 - 8.1 g/dL   Albumin 3.6 3.5 - 5.0 g/dL   AST 22 15 - 41 U/L   ALT 32 0 - 44 U/L   Alkaline Phosphatase 128 (H) 38 - 126 U/L   Total Bilirubin 4.0 (H) 0.3 - 1.2 mg/dL   Bilirubin, Direct 0.2 0.0 - 0.2 mg/dL   Indirect Bilirubin 3.8 (H) 0.3 - 0.9 mg/dL    Comment: Performed at [facility], [address] [place],  [place] [address]  Urinalysis, Complete     Status: Abnormal   Collection Time: 07/29/19 10:42 AM  Result Value Ref Range   Specific Gravity, UA >1.030 (H) 1.005 - 1.030   pH, UA 5.5 5.0 - 7.5   Color, UA Yellow Yellow   Appearance Ur Clear Clear   Leukocytes,UA Negative Negative   Protein,UA Negative Negative/Trace   Glucose, UA Negative Negative   Ketones, UA Negative Negative   RBC, UA Negative Negative   Bilirubin, UA Negative Negative   Urobilinogen, Ur 0.2 0.2 - 1.0 mg/dL   Nitrite, UA Negative Negative    Microscopic Examination See below:   Microscopic Examination     Status: Abnormal   Collection Time: 07/29/19 10:42 AM   Urine  Result Value Ref Range   WBC, UA 0-5 0 - 5 /hpf   RBC None seen 0 - 2 /hpf   Epithelial Cells (non renal) 0-10 0 - 10 /hpf   Crystals Present (A) N/A   Crystal Type Uric Acid N/A   Bacteria, UA Few None seen/Few  CULTURE, URINE COMPREHENSIVE     Status: None   Collection Time: 07/29/19 11:45 AM   Specimen: Urine   UR  Result Value Ref Range   Urine Culture, Comprehensive Final report    Organism ID, Bacteria Comment     Comment: Mixed urogenital flora 10,000-25,000 colony forming units per mL   Urinalysis, Complete     Status: Abnormal   Collection Time: 07/31/19  8:19 AM  Result Value Ref Range   Specific Gravity, UA 1.025 1.005 - 1.030   pH, UA 5.0 5.0 - 7.5   Color, UA Yellow Yellow   Appearance Ur Hazy (A) Clear   Leukocytes,UA Negative Negative   Protein,UA Negative Negative/Trace   Glucose, UA Negative Negative   Ketones, UA Trace (A) Negative   RBC, UA Negative Negative   Bilirubin, UA Negative Negative   Urobilinogen, Ur 0.2 0.2 - 1.0 mg/dL   Nitrite, UA Negative Negative   Microscopic Examination See below:   Microscopic Examination     Status: Abnormal   Collection Time: 07/31/19  8:19 AM   Urine  Result Value Ref Range   WBC, UA 0-5 0 - 5 /hpf   RBC None seen 0 - 2 /hpf   Epithelial Cells (non renal) 0-10 0 - 10 /hpf   Crystals Present (A) N/A   Crystal Type Calcium Oxalate N/A   Bacteria, UA Few None seen/Few  Urinalysis, Complete     Status: Abnormal   Collection Time: 08/02/19  8:36 AM  Result Value Ref Range   Specific Gravity, UA <1.005 (L) 1.005 - 1.030   pH, UA 5.0 5.0 - 7.5   Color, UA Yellow Yellow   Appearance Ur Clear Clear   Leukocytes,UA Negative Negative   Protein,UA Negative Negative/Trace   Glucose, UA Negative Negative   Ketones, UA Negative Negative   RBC, UA Negative Negative   Bilirubin, UA Negative  Negative   Urobilinogen, Ur 0.2 0.2 - 1.0 mg/dL   Nitrite, UA Negative Negative   Microscopic Examination See below:   Microscopic Examination     Status: None   Collection Time: 08/02/19  8:36 AM   Urine  Result Value Ref Range   WBC, UA 0-5 0 - 5 /hpf   RBC None seen 0 - 2 /hpf   Epithelial Cells (non renal) 0-10 0 - 10 /hpf   Bacteria, UA None seen None seen/Few  Urinalysis, Complete  Status: Abnormal   Collection Time: 08/05/19  8:26 AM  Result Value Ref Range   Specific Gravity, UA >1.030 (H) 1.005 - 1.030   pH, UA 5.5 5.0 - 7.5   Color, UA Yellow Yellow   Appearance Ur Cloudy (A) Clear   Leukocytes,UA Negative Negative   Protein,UA Negative Negative/Trace   Glucose, UA 2+ (A) Negative   Ketones, UA Negative Negative   RBC, UA Negative Negative   Bilirubin, UA Negative Negative   Urobilinogen, Ur 0.2 0.2 - 1.0 mg/dL   Nitrite, UA Negative Negative   Microscopic Examination See below:   Microscopic Examination     Status: Abnormal   Collection Time: 08/05/19  8:26 AM   Urine  Result Value Ref Range   WBC, UA 0-5 0 - 5 /hpf   RBC None seen 0 - 2 /hpf   Epithelial Cells (non renal) 0-10 0 - 10 /hpf   Crystals Present (A) N/A   Crystal Type Calcium Oxalate N/A   Bacteria, UA Few None seen/Few  Urinalysis, Complete     Status: Abnormal   Collection Time: 08/07/19  9:53 AM  Result Value Ref Range   Specific Gravity, UA <1.005 (L) 1.005 - 1.030   pH, UA 5.5 5.0 - 7.5   Color, UA Straw Yellow   Appearance Ur Hazy (A) Clear   Leukocytes,UA Negative Negative   Protein,UA Negative Negative/Trace   Glucose, UA Negative Negative   Ketones, UA Negative Negative   RBC, UA Negative Negative   Bilirubin, UA Negative Negative   Urobilinogen, Ur 0.2 0.2 - 1.0 mg/dL   Nitrite, UA Negative Negative   Microscopic Examination See below:   Microscopic Examination     Status: None   Collection Time: 08/07/19  9:53 AM   Urine  Result Value Ref Range   WBC, UA 0-5 0 - 5 /hpf    RBC 0-2 0 - 2 /hpf   Epithelial Cells (non renal) 0-10 0 - 10 /hpf   Bacteria, UA None seen None seen/Few  Urinalysis, Complete     Status: Abnormal   Collection Time: 08/09/19  8:20 AM  Result Value Ref Range   Specific Gravity, UA 1.025 1.005 - 1.030   pH, UA 5.0 5.0 - 7.5   Color, UA Yellow Yellow   Appearance Ur Cloudy (A) Clear   Leukocytes,UA Trace (A) Negative   Protein,UA Negative Negative/Trace   Glucose, UA Negative Negative   Ketones, UA Negative Negative   RBC, UA Negative Negative   Bilirubin, UA Negative Negative   Urobilinogen, Ur 0.2 0.2 - 1.0 mg/dL   Nitrite, UA Negative Negative   Microscopic Examination See below:   Microscopic Examination     Status: Abnormal   Collection Time: 08/09/19  8:20 AM   Urine  Result Value Ref Range   WBC, UA 0-5 0 - 5 /hpf   RBC None seen 0 - 2 /hpf   Epithelial Cells (non renal) 0-10 0 - 10 /hpf   Casts Present (A) None seen /lpf   Cast Type Hyaline casts N/A   Crystals Present (A) N/A   Crystal Type Calcium Oxalate N/A   Bacteria, UA Few None seen/Few  Bladder Scan (Post Void Residual) in office     Status: None   Collection Time: 08/12/19  8:33 AM  Result Value Ref Range   Scan Result 22   Urinalysis, Complete     Status: Abnormal   Collection Time: 08/12/19  8:51 AM  Result Value Ref  Range   Specific Gravity, UA 1.025 1.005 - 1.030   pH, UA 5.5 5.0 - 7.5   Color, UA Yellow Yellow   Appearance Ur Cloudy (A) Clear   Leukocytes,UA Negative Negative   Protein,UA Negative Negative/Trace   Glucose, UA Negative Negative   Ketones, UA Negative Negative   RBC, UA Negative Negative   Bilirubin, UA Negative Negative   Urobilinogen, Ur 0.2 0.2 - 1.0 mg/dL   Nitrite, UA Negative Negative   Microscopic Examination See below:   Microscopic Examination     Status: Abnormal   Collection Time: 08/12/19  8:51 AM   Urine  Result Value Ref Range   WBC, UA 0-5 0 - 5 /hpf   RBC None seen 0 - 2 /hpf   Epithelial Cells (non renal)  0-10 0 - 10 /hpf   Renal Epithel, UA 0-10 (A) None seen /hpf   Bacteria, UA Few None seen/Few  CULTURE, URINE COMPREHENSIVE     Status: None   Collection Time: 08/12/19  9:31 AM   Specimen: Urine   UR  Result Value Ref Range   Urine Culture, Comprehensive Final report    Organism ID, Bacteria Comment     Comment: Mixed urogenital flora 10,000-25,000 colony forming units per mL   Glucose, capillary     Status: Abnormal   Collection Time: 09/09/19  1:37 PM  Result Value Ref Range   Glucose-Capillary 149 (H) 70 - 99 mg/dL    Comment: Glucose reference range applies only to samples taken after fasting for at least 8 hours.  Urinalysis, Complete w Microscopic     Status: Abnormal   Collection Time: 09/09/19  2:39 PM  Result Value Ref Range   Color, Urine YELLOW (A) YELLOW   APPearance HAZY (A) CLEAR   Specific Gravity, Urine 1.010 1.005 - 1.030   pH 5.0 5.0 - 8.0   Glucose, UA NEGATIVE NEGATIVE mg/dL   Hgb urine dipstick NEGATIVE NEGATIVE   Bilirubin Urine NEGATIVE NEGATIVE   Ketones, ur 5 (A) NEGATIVE mg/dL   Protein, ur NEGATIVE NEGATIVE mg/dL   Nitrite NEGATIVE NEGATIVE   Leukocytes,Ua TRACE (A) NEGATIVE   RBC / HPF 0-5 0 - 5 RBC/hpf   WBC, UA 0-5 0 - 5 WBC/hpf   Bacteria, UA RARE (A) NONE SEEN   Squamous Epithelial / LPF 6-10 0 - 5   Mucus PRESENT     Comment: Performed at [facility], [address] [place], [place] [address]  Culture, blood (routine x 2)     Status: None   Collection Time: 09/09/19  3:40 PM   Specimen: BLOOD  Result Value Ref Range   Specimen Description BLOOD PORTA CATH    Special Requests      BOTTLES DRAWN AEROBIC AND ANAEROBIC Blood Culture adequate volume   Culture      NO GROWTH 5 DAYS Performed at [facility], [address] [place], [place] [address]    Report Status 09/14/2019 FINAL   Culture, blood (routine x 2)     Status: None   Collection Time: 09/09/19  3:40 PM   Specimen: BLOOD  Result Value Ref Range    Specimen Description BLOOD PORTA CATH    Special Requests      BOTTLES DRAWN AEROBIC AND ANAEROBIC Blood Culture results may not be optimal due to an excessive volume of blood received in culture bottles   Culture      NO GROWTH 5 DAYS Performed at [facility], [address] [place], [place] [address]    Report Status 09/14/2019 FINAL   CBC with Differential     Status: Abnormal   Collection Time: 09/09/19  3:40 PM  Result Value Ref Range   WBC 8.5 4.0 - 10.5 K/uL   RBC 4.21 3.87 - 5.11 MIL/uL   Hemoglobin 12.5 12.0 - 15.0 g/dL   HCT 34.4 (L) 36 - 46 %   MCV 81.7 80.0 - 100.0 fL   MCH 29.7 26.0 - 34.0 pg   MCHC 36.3 (H) 30.0 - 36.0 g/dL   RDW 13.0 11.5 - 15.5 %   Platelets 161 150 - 400 K/uL   nRBC 0.0 0.0 - 0.2 %   Neutrophils Relative % 68 %   Neutro Abs 5.8 1.7 - 7.7 K/uL   Lymphocytes Relative 25 %   Lymphs Abs 2.1 0.7 - 4.0 K/uL   Monocytes Relative 6 %   Monocytes Absolute 0.5 0 - 1 K/uL   Eosinophils Relative 1 %   Eosinophils Absolute 0.0 0 - 0 K/uL   Basophils Relative 0 %   Basophils Absolute 0.0 0 - 0 K/uL   Immature Granulocytes 0 %   Abs Immature Granulocytes 0.03 0.00 - 0.07 K/uL    Comment: Performed at [facility], [address] [place], [place] [address]  Comprehensive metabolic panel     Status: Abnormal   Collection Time: 09/09/19  3:40 PM  Result Value Ref Range   Sodium 139 135 - 145 mmol/L   Potassium 2.6 (LL) 3.5 - 5.1 mmol/L    Comment: CRITICAL RESULT CALLED TO, READ BACK BY AND VERIFIED WITH AMY COYNE RN AT 1649 ON 09/09/19 SNG    Chloride 100 98 - 111 mmol/L   CO2 30 22 - 32 mmol/L   Glucose, Bld 253 (H) 70 - 99 mg/dL    Comment: Glucose reference range applies only to samples taken after fasting for at least 8 hours.   BUN 9 6 - 20 mg/dL   Creatinine, Ser 0.66 0.44 - 1.00 mg/dL   Calcium 7.9 (L) 8.9 - 10.3 mg/dL   Total Protein 6.7 6.5 - 8.1 g/dL   Albumin 3.8 3.5 - 5.0 g/dL   AST 24 15 - 41 U/L   ALT 39 0 - 44  U/L   Alkaline Phosphatase 147 (H) 38 - 126 U/L   Total Bilirubin 5.2 (H) 0.3 - 1.2 mg/dL   GFR calc non Af Amer >60 >60 mL/min   GFR calc Af Amer >60 >60 mL/min   Anion gap 9 5 - 15    Comment: Performed at [facility], [address] [place], [place] [address]  Lactic acid, plasma     Status: None   Collection Time: 09/09/19  3:40 PM  Result Value Ref Range   Lactic Acid, Venous 1.4 0.5 - 1.9 mmol/L    Comment: Performed at [facility], [address] [place], [place] [address]  SARS Coronavirus 2 by RT PCR (hospital order, performed in [facility] hospital lab) Nasopharyngeal Nasopharyngeal Swab     Status: None   Collection Time: 09/09/19  6:34 PM   Specimen: Nasopharyngeal Swab  Result Value Ref Range   SARS Coronavirus 2 NEGATIVE NEGATIVE    Comment: (NOTE) SARS-CoV-2 target nucleic acids are NOT DETECTED.  The SARS-CoV-2 RNA is generally detectable in upper and lower respiratory specimens during the acute phase of infection. The lowest concentration of SARS-CoV-2 viral copies this assay can detect is 250  copies / mL. A negative result does not preclude SARS-CoV-2 infection and should not be used as the sole basis for treatment or other patient management decisions.  A negative result may occur with improper specimen collection / handling, submission of specimen other than nasopharyngeal swab, presence of viral mutation(s) within the areas targeted by this assay, and inadequate number of viral copies (<250 copies / mL). A negative result must be combined with clinical observations, patient history, and epidemiological information.  Fact Sheet for Patients:   StrictlyIdeas.no  Fact Sheet for Healthcare Providers: BankingDealers.co.za  This test is not yet approved or  cleared by the [place] FDA and has been authorized for detection and/or diagnosis of SARS-CoV-2 by FDA under an Emergency Use  Authorization (EUA).  This EUA will remain in effect (meaning this test can be used) for the duration of the COVID-19 declaration under Section 564(b)(1) of the Act, 21 U.S.C. section 360bbb-3(b)(1), unless the authorization is terminated or revoked sooner.  Performed at [facility], [address] [place], [place] [address]   Hepatic function panel     Status: Abnormal   Collection Time: 10/02/19 10:05 AM  Result Value Ref Range   Total Protein 8.2 (H) 6.5 - 8.1 g/dL   Albumin 4.7 3.5 - 5.0 g/dL   AST 34 15 - 41 U/L   ALT 59 (H) 0 - 44 U/L   Alkaline Phosphatase 142 (H) 38 - 126 U/L   Total Bilirubin 3.3 (H) 0.3 - 1.2 mg/dL   Bilirubin, Direct 0.3 (H) 0.0 - 0.2 mg/dL   Indirect Bilirubin 3.0 (H) 0.3 - 0.9 mg/dL    Comment: Performed at [facility], [address], [place], [place] [address]    Radiology DG Chest 2 View  Result Date: 09/09/2019 CLINICAL DATA:  Chest soft tissue infection at port site. EXAM: CHEST - 2 VIEW COMPARISON:  07/26/2019 FINDINGS: The heart size and mediastinal contours are within normal limits. Both lungs are clear. Right-sided Port-A-Cath is again seen in appropriate position, with distal tip overlying expected location of the superior cavoatrial junction. IMPRESSION: Right-sided Port-A-Cath remains in appropriate position. No active cardiopulmonary disease. Electronically Signed   By: Marlaine Hind M.D.   On: 09/09/2019 13:34   DG Shoulder Right  Result Date: 09/09/2019 CLINICAL DATA:  Right shoulder pain and decreased range of motion. No known injury. EXAM: RIGHT SHOULDER - 2+ VIEW COMPARISON:  None. FINDINGS: There is no evidence of fracture or dislocation. There is no evidence of arthropathy or other focal bone abnormality. Right-sided Port-A-Cath is seen within the right chest wall soft tissues. Soft tissues are otherwise unremarkable. IMPRESSION: Negative. Electronically Signed   By: Marlaine Hind M.D.   On: 09/09/2019 13:35    US Venous Img Upper Uni Right(DVT)  Result Date: 09/09/2019 CLINICAL DATA:  Right upper arm pain and edema. Indwelling right-sided chest Port-A-Cath. EXAM: RIGHT UPPER EXTREMITY VENOUS DOPPLER ULTRASOUND TECHNIQUE: Gray-scale sonography with graded compression, as well as color Doppler and duplex ultrasound were performed to evaluate the upper extremity deep venous system from the level of the subclavian vein and including the jugular, axillary, basilic, radial, ulnar and upper cephalic vein. Spectral Doppler was utilized to evaluate flow at rest and with distal augmentation maneuvers. COMPARISON:  None. FINDINGS: Contralateral Subclavian Vein: Respiratory phasicity is normal and symmetric with the symptomatic side. No evidence of thrombus. Normal compressibility. Internal Jugular Vein: No evidence of thrombus. Normal compressibility, respiratory phasicity and response to augmentation. Subclavian Vein: No  evidence of thrombus. Normal compressibility, respiratory phasicity and response to augmentation. Axillary Vein: No evidence of thrombus. Normal compressibility, respiratory phasicity and response to augmentation. Cephalic Vein: No evidence of thrombus. Normal compressibility, respiratory phasicity and response to augmentation. Basilic Vein: No evidence of thrombus. Normal compressibility, respiratory phasicity and response to augmentation. Brachial Veins: No evidence of thrombus. Normal compressibility, respiratory phasicity and response to augmentation. Radial Veins: No evidence of thrombus. Normal compressibility, respiratory phasicity and response to augmentation. Ulnar Veins: No evidence of thrombus. Normal compressibility, respiratory phasicity and response to augmentation. Venous Reflux:  None visualized. Other Findings: No evidence of superficial thrombophlebitis or abnormal fluid collection. IMPRESSION: No evidence of DVT within the right upper extremity. Electronically Signed   By: Aletta Edouard  M.D.   On: 09/09/2019 14:35    Assessment/Plan  Hypertension blood pressure control important in reducing the progression of atherosclerotic disease. On appropriate oral medications.   Diabetes mellitus without complication (HCC) blood glucose control important in reducing the progression of atherosclerotic disease. Also, involved in wound healing. On appropriate medications.   Poor venous access With her multiple medical issues this was the reason for the port placement initially.  She still needs the port indefinitely.  Port-A-Cath in place At current, the port does not appear inflamed or infected in any way.  No tenderness or erythema is currently present.  She did not have bacteremia that I saw.  At current, I would use her port but with some caution and continue to monitor the area.  Not need continued antibiotics from my point of view.    Leotis Pain, MD  10/08/2019 10:25 AM    This note was created with Dragon medical transcription system.  Any errors from dictation are purely unintentional

## 2019-10-09 DIAGNOSIS — Z794 Long term (current) use of insulin: Secondary | ICD-10-CM | POA: Diagnosis not present

## 2019-10-09 DIAGNOSIS — R945 Abnormal results of liver function studies: Secondary | ICD-10-CM | POA: Diagnosis not present

## 2019-10-09 DIAGNOSIS — I700 Atherosclerosis of aorta: Secondary | ICD-10-CM | POA: Diagnosis not present

## 2019-10-09 DIAGNOSIS — R509 Fever, unspecified: Secondary | ICD-10-CM | POA: Diagnosis not present

## 2019-10-09 DIAGNOSIS — E119 Type 2 diabetes mellitus without complications: Secondary | ICD-10-CM | POA: Diagnosis not present

## 2019-10-15 ENCOUNTER — Telehealth: Payer: Self-pay | Admitting: *Deleted

## 2019-10-15 DIAGNOSIS — K7581 Nonalcoholic steatohepatitis (NASH): Secondary | ICD-10-CM | POA: Diagnosis not present

## 2019-10-15 NOTE — Telephone Encounter (Signed)
Patient called to state that she has been to se Dr. Lucky Cowboy and he has give approval for her to come back and have her port-a-cath flushed.  Appointment scheduled.

## 2019-10-16 ENCOUNTER — Ambulatory Visit
Admission: RE | Admit: 2019-10-16 | Discharge: 2019-10-16 | Disposition: A | Discharge status: Home / Self Care | Payer: Medicare HMO | Source: Ambulatory Visit | Attending: Vascular Surgery | Admitting: Vascular Surgery

## 2019-10-16 DIAGNOSIS — Z452 Encounter for adjustment and management of vascular access device: Secondary | ICD-10-CM | POA: Diagnosis not present

## 2019-10-16 MED ORDER — HEPARIN SOD (PORK) LOCK FLUSH 100 UNIT/ML IV SOLN
INTRAVENOUS | Status: AC
  Administered 2019-10-16: 500 [IU] via INTRAVENOUS
  Filled 2019-10-16: qty 5

## 2019-10-16 MED ORDER — SODIUM CHLORIDE 0.9% FLUSH: 10.0000 mL | Freq: Once | INTRAVENOUS | Status: DC

## 2019-10-16 MED ORDER — SODIUM CHLORIDE FLUSH 0.9 % IV SOLN
INTRAVENOUS | Status: AC
  Filled 2019-10-16: qty 10

## 2019-10-16 MED ORDER — HEPARIN SOD (PORK) LOCK FLUSH 100 UNIT/ML IV SOLN: 500.0000 [IU] | Freq: Once | INTRAVENOUS | Status: AC

## 2019-10-17 ENCOUNTER — Other Ambulatory Visit: Payer: Self-pay | Admitting: Orthopedic Surgery

## 2019-10-17 DIAGNOSIS — M25511 Pain in right shoulder: Secondary | ICD-10-CM

## 2019-10-17 DIAGNOSIS — M75101 Unspecified rotator cuff tear or rupture of right shoulder, not specified as traumatic: Secondary | ICD-10-CM

## 2019-10-24 DIAGNOSIS — K7581 Nonalcoholic steatohepatitis (NASH): Secondary | ICD-10-CM | POA: Diagnosis not present

## 2019-11-05 ENCOUNTER — Other Ambulatory Visit: Payer: Self-pay

## 2019-11-05 ENCOUNTER — Ambulatory Visit
Admission: RE | Admit: 2019-11-05 | Discharge: 2019-11-05 | Disposition: A | Discharge status: Home / Self Care | Payer: Medicare HMO | Source: Ambulatory Visit | Attending: Orthopedic Surgery | Admitting: Orthopedic Surgery

## 2019-11-05 DIAGNOSIS — M75101 Unspecified rotator cuff tear or rupture of right shoulder, not specified as traumatic: Secondary | ICD-10-CM | POA: Diagnosis not present

## 2019-11-05 DIAGNOSIS — M25511 Pain in right shoulder: Secondary | ICD-10-CM | POA: Diagnosis not present

## 2019-11-26 ENCOUNTER — Ambulatory Visit
Admission: RE | Admit: 2019-11-26 | Discharge: 2019-11-26 | Disposition: A | Discharge status: Home / Self Care | Payer: Medicare HMO | Source: Ambulatory Visit | Attending: Vascular Surgery | Admitting: Vascular Surgery

## 2019-11-26 DIAGNOSIS — I998 Other disorder of circulatory system: Secondary | ICD-10-CM | POA: Insufficient documentation

## 2019-11-26 MED ORDER — HEPARIN SOD (PORK) LOCK FLUSH 100 UNIT/ML IV SOLN
INTRAVENOUS | Status: AC
  Administered 2019-11-26: 500 [IU]
  Filled 2019-11-26: qty 5

## 2019-11-26 MED ORDER — HEPARIN SOD (PORK) LOCK FLUSH 100 UNIT/ML IV SOLN: 500.0000 [IU] | INTRAVENOUS | Status: AC | PRN

## 2019-11-30 DIAGNOSIS — Z20822 Contact with and (suspected) exposure to covid-19: Secondary | ICD-10-CM | POA: Diagnosis not present

## 2019-12-02 DIAGNOSIS — E1159 Type 2 diabetes mellitus with other circulatory complications: Secondary | ICD-10-CM | POA: Diagnosis not present

## 2019-12-13 DIAGNOSIS — E1142 Type 2 diabetes mellitus with diabetic polyneuropathy: Secondary | ICD-10-CM | POA: Diagnosis not present

## 2019-12-13 DIAGNOSIS — E139 Other specified diabetes mellitus without complications: Secondary | ICD-10-CM | POA: Diagnosis not present

## 2019-12-13 DIAGNOSIS — Z9641 Presence of insulin pump (external) (internal): Secondary | ICD-10-CM | POA: Diagnosis not present

## 2019-12-13 DIAGNOSIS — E1365 Other specified diabetes mellitus with hyperglycemia: Secondary | ICD-10-CM | POA: Diagnosis not present

## 2019-12-13 DIAGNOSIS — E1159 Type 2 diabetes mellitus with other circulatory complications: Secondary | ICD-10-CM | POA: Diagnosis not present

## 2019-12-16 DIAGNOSIS — R159 Full incontinence of feces: Secondary | ICD-10-CM | POA: Diagnosis not present

## 2019-12-16 DIAGNOSIS — R150 Incomplete defecation: Secondary | ICD-10-CM | POA: Diagnosis not present

## 2019-12-31 ENCOUNTER — Other Ambulatory Visit: Payer: Self-pay

## 2019-12-31 ENCOUNTER — Ambulatory Visit
Admission: RE | Admit: 2019-12-31 | Discharge: 2019-12-31 | Disposition: A | Discharge status: Home / Self Care | Payer: Medicare HMO | Source: Ambulatory Visit | Attending: Vascular Surgery | Admitting: Vascular Surgery

## 2019-12-31 DIAGNOSIS — E1165 Type 2 diabetes mellitus with hyperglycemia: Secondary | ICD-10-CM | POA: Diagnosis not present

## 2019-12-31 LAB — COMPREHENSIVE METABOLIC PANEL
ALT: 33 U/L (ref 0–44)
AST: 29 U/L (ref 15–41)
Albumin: 3.7 g/dL (ref 3.5–5.0)
Alkaline Phosphatase: 132 U/L — ABNORMAL HIGH (ref 38–126)
Anion gap: 9 (ref 5–15)
BUN: 12 mg/dL (ref 6–20)
CO2: 28 mmol/L (ref 22–32)
Calcium: 7.9 mg/dL — ABNORMAL LOW (ref 8.9–10.3)
Chloride: 101 mmol/L (ref 98–111)
Creatinine, Ser: 0.81 mg/dL (ref 0.44–1.00)
GFR, Estimated: >60 mL/min (ref >60)
Glucose, Bld: 212 mg/dL — ABNORMAL HIGH (ref 70–99)
Potassium: 3.1 mmol/L — ABNORMAL LOW (ref 3.5–5.1)
Sodium: 138 mmol/L (ref 135–145)
Total Bilirubin: 2.9 mg/dL — ABNORMAL HIGH (ref 0.3–1.2)
Total Protein: 6.7 g/dL (ref 6.5–8.1)

## 2019-12-31 LAB — LIPID PANEL
Cholesterol: 111 mg/dL (ref 0–200)
HDL: 33 mg/dL — ABNORMAL LOW (ref >40)
LDL Cholesterol: 65 mg/dL (ref 0–99)
Total CHOL/HDL Ratio: 3.4 RATIO
Triglycerides: 65 mg/dL (ref <150)
VLDL: 13 mg/dL (ref 0–40)

## 2019-12-31 LAB — HEMOGLOBIN A1C
Hgb A1c MFr Bld: 7.6 % — ABNORMAL HIGH (ref 4.8–5.6)
Mean Plasma Glucose: 171.42 mg/dL

## 2019-12-31 LAB — TSH: TSH: 1.292 u[IU]/mL (ref 0.350–4.500)

## 2019-12-31 MED ORDER — HEPARIN SOD (PORK) LOCK FLUSH 100 UNIT/ML IV SOLN: 500.0000 [IU] | Freq: Once | INTRAVENOUS | Status: DC

## 2019-12-31 MED ORDER — HEPARIN SOD (PORK) LOCK FLUSH 100 UNIT/ML IV SOLN
500.0000 [IU] | INTRAVENOUS | Status: AC | PRN
  Administered 2019-12-31: 500 [IU]

## 2020-01-03 DIAGNOSIS — E1159 Type 2 diabetes mellitus with other circulatory complications: Secondary | ICD-10-CM | POA: Diagnosis not present

## 2020-01-03 DIAGNOSIS — E1365 Other specified diabetes mellitus with hyperglycemia: Secondary | ICD-10-CM | POA: Diagnosis not present

## 2020-01-03 DIAGNOSIS — E1142 Type 2 diabetes mellitus with diabetic polyneuropathy: Secondary | ICD-10-CM | POA: Diagnosis not present

## 2020-01-03 DIAGNOSIS — Z9641 Presence of insulin pump (external) (internal): Secondary | ICD-10-CM | POA: Diagnosis not present

## 2020-01-07 DIAGNOSIS — M7541 Impingement syndrome of right shoulder: Secondary | ICD-10-CM | POA: Diagnosis not present

## 2020-01-07 DIAGNOSIS — M778 Other enthesopathies, not elsewhere classified: Secondary | ICD-10-CM | POA: Diagnosis not present

## 2020-01-07 DIAGNOSIS — M7551 Bursitis of right shoulder: Secondary | ICD-10-CM | POA: Diagnosis not present

## 2020-01-07 DIAGNOSIS — G8929 Other chronic pain: Secondary | ICD-10-CM | POA: Diagnosis not present

## 2020-01-07 DIAGNOSIS — M62838 Other muscle spasm: Secondary | ICD-10-CM | POA: Diagnosis not present

## 2020-01-07 DIAGNOSIS — M25511 Pain in right shoulder: Secondary | ICD-10-CM | POA: Diagnosis not present

## 2020-01-15 ENCOUNTER — Other Ambulatory Visit
Admission: RE | Admit: 2020-01-15 | Discharge: 2020-01-15 | Disposition: A | Discharge status: Home / Self Care | Payer: Medicare HMO | Attending: Gastroenterology | Admitting: Gastroenterology

## 2020-01-15 ENCOUNTER — Ambulatory Visit (INDEPENDENT_AMBULATORY_CARE_PROVIDER_SITE_OTHER): Payer: Medicare HMO | Admitting: Gastroenterology

## 2020-01-15 ENCOUNTER — Other Ambulatory Visit: Payer: Self-pay

## 2020-01-15 ENCOUNTER — Encounter: Payer: Self-pay | Admitting: Gastroenterology

## 2020-01-15 VITALS — BP 150/87 | HR 72 | Ht 64.0 in | Wt 163.6 lb

## 2020-01-15 DIAGNOSIS — K7581 Nonalcoholic steatohepatitis (NASH): Secondary | ICD-10-CM

## 2020-01-15 DIAGNOSIS — K580 Irritable bowel syndrome with diarrhea: Secondary | ICD-10-CM

## 2020-01-15 DIAGNOSIS — K7460 Unspecified cirrhosis of liver: Secondary | ICD-10-CM | POA: Insufficient documentation

## 2020-01-15 NOTE — Progress Notes (Signed)
Primary Care Physician: [name], MD  Primary Gastroenterologist:  Dr. Lucilla Lame  Chief Complaint  Patient presents with  . Diarrhea    HPI: Amber Christensen is a 54 y.o. female here with a history of multiple GI issues including recently nausea and vomiting after eating at [employer], pancreatic insufficiency with abnormal liver enzymes and nonalcoholic fatty liver disease.  Please see my previous notes to delineate all the previous gastroenterologist that she has seen and followed up with at [facility] and is now follow-up with me for these issues.  The patient was started on Lotronex for her diarrhea but it was not covered by her insurance therefore she was put on Lomotil. She reports she found out she had previously taken Lomotil and was allergic. The patient recently had anal manometry at [facility] that was prescribed for her by her last gastroenterologist.  The patient states that she was told she had pelvic floor dyssynergia.  The patient also reports that she had gone through pelvic floor physical therapy without any resolutions of her symptoms in the past.  She also states that her stools are not liquid but pasty.  She is having 3 episodes of incontinence on average a month but it was much worse with 2-3 times a week in the past.  She states that she does have a fiber bar in the morning and has changed her diet which she thinks has helped her symptoms.  The patient also is concerned whether or not she really does have cirrhosis.  The patient's last liver enzymes showed her to have normal AST and ALT with increased alkaline phosphatase.  Past Medical History:  Diagnosis Date  . Allergy    many many allergies  . Anemia    in past  . Anginal pain ([place]) 01/2018   cardiac workup clear  . Anxiety   . 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 Bipolar disorder ([place])   . Bladder mass    removed and had tbt. mesh removed  . Blood transfusion without reported  diagnosis 1999   received 20 units of blood after tearing esophagus from vomitting so severely after ERCP  . Breast mass    left side biopsy was negative  . Carpal tunnel syndrome    left hand  . Cervical cancer ([place]) 2002  . Chronic kidney disease    cyst on left kidney  . Chronic pancreatitis ([place])    prior to pancreas surgery  . Cirrhosis ([place])   . Clotting disorder ([place])    pt states that sometimes she has difficulty stopping to bleed and other times it is okay  . Depression   . Diabetes mellitus without complication (HCC)    has insulin pump  . Dyspnea   . Esophagitis   . GERD (gastroesophageal reflux disease)   . Headache    migraines...takes compazine for this  . Heart disease   . 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 Hypertension   . Irritable bowel syndrome   . Kidney mass    just watching. left kidney mass.  . Liver disease    NASH. has had liver biopsies. negative except for NASH  . NASH (nonalcoholic steatohepatitis)   . NASH (nonalcoholic steatohepatitis)   . Pancreatitis   . PONV (postoperative nausea and vomiting)   . Stroke ([place]) 11/2017   had tia. no longer  needs plavix  . Thyroid disease     Current Outpatient Medications  Medication Sig Dispense Refill  . amLODipine (NORVASC) 5 MG tablet Take 5 mg by mouth daily.    . Calcium Carb-Cholecalciferol (CALCIUM 600/VITAMIN D3 PO) Take 1 tablet by mouth daily.    . Cholecalciferol (VITAMIN D-3) 5000 units TABS Take 5,000 Units by mouth daily.    . Continuous Blood Gluc Receiver (DEXCOM G6 RECEIVER) DEVI Use to monitor blood sugar. [place] [phone]    . CONTOUR NEXT TEST test strip 1 each 4 (four) times daily.    . cyanocobalamin 1000 MCG tablet Take by mouth.    . EPINEPHrine 0.3 mg/0.3 mL IJ SOAJ injection SMARTSIG:1 Pre-Filled Pen Syringe IM PRN    . gabapentin (NEURONTIN) 300 MG capsule Take 600 mg by mouth 3 (three) times daily.    .  insulin lispro (HUMALOG) 100 UNIT/ML injection USE UP TO 70 UNITS SUBCUTANEOUSLY PER DAY VIA INSULIN PUMP. Dx code e10.65    . LACTOBACILLUS PO Take by mouth.    . lipase/protease/amylase (CREON) 36000 UNITS CPEP capsule Take 144,000 Units by mouth 3 (three) times daily before meals.     Marland Kitchen lisinopril (ZESTRIL) 40 MG tablet Take 40 mg by mouth daily.    . Magnesium (V-R MAGNESIUM) 250 MG TABS Take 250 mg by mouth daily.     . metoprolol succinate (TOPROL-XL) 25 MG 24 hr tablet Take 25 mg by mouth daily.    Marland Kitchen MITIGARE 0.6 MG CAPS Take 1 capsule by mouth 2 (two) times daily.    Marland Kitchen oxyCODONE-acetaminophen (PERCOCET) 5-325 MG tablet Take 1 tablet by mouth every 8 (eight) hours as needed. 20 tablet 0  . pantoprazole (PROTONIX) 40 MG tablet Take 1 tablet (40 mg total) by mouth daily. 30 tablet 1  . potassium chloride (KLOR-CON) 10 MEQ tablet Take 10 mEq by mouth 2 (two) times daily.    . vitamin E 400 UNIT capsule Take 400 Units by mouth daily.     No current facility-administered medications for this visit.    Allergies as of 01/15/2020 - Review Complete 10/16/2019  Allergen Reaction Noted  . Ciprofloxacin  02/06/2019  . Demerol [meperidine] Hives and Other (See Comments) 07/17/2014  . Dilaudid [hydromorphone hcl] Nausea And Vomiting and Other (See Comments) 02/24/2015  . Metoclopramide Hives 07/21/2014  . Morphine and related Anaphylaxis and Other (See Comments) 07/17/2014  . Morpholine salicylate Nausea And Vomiting, Other (See Comments), and Rash 10/28/2013  . Isosorbide nitrate Other (See Comments) 02/05/2014  . Sulfa antibiotics Nausea And Vomiting 07/30/2014  . Flagyl [metronidazole]  02/06/2019  . Adhesive [tape] Rash 03/09/2018  . Darvon [propoxyphene] Nausea And Vomiting and Rash 02/24/2015  . Flexeril [cyclobenzaprine] Nausea And Vomiting and Rash 07/17/2014  . Levaquin [levofloxacin in d5w] Nausea And Vomiting and Rash 07/17/2014  . Naproxen Rash 03/06/2018  . Soma [carisoprodol]  Nausea And Vomiting and Rash 07/17/2014  . Zofran [ondansetron hcl] Nausea And Vomiting and Rash 07/17/2014    ROS:  General: Negative for anorexia, weight loss, fever, chills, fatigue, weakness. ENT: Negative for hoarseness, difficulty swallowing , nasal congestion. CV: Negative for chest pain, angina, palpitations, dyspnea on exertion, peripheral edema.  Respiratory: Negative for dyspnea at rest, dyspnea on exertion, cough, sputum, wheezing.  GI: See history of present illness. GU:  Negative for dysuria, hematuria, urinary incontinence, urinary frequency, nocturnal urination.  Endo: Negative for unusual weight change.    Physical Examination:   BP (!) 150/87   Pulse 72  Ht 5' 4"  (1.626 m)   Wt 163 lb 9.6 oz (74.2 kg)   BMI 28.08 kg/m   General: Well-nourished, well-developed in no acute distress.  Eyes: No icterus. Conjunctivae pink. Lungs: Clear to auscultation bilaterally. Non-labored. Heart: Regular rate and rhythm, no murmurs rubs or gallops.  Abdomen: Bowel sounds are normal, nontender, nondistended, no hepatosplenomegaly or masses, no abdominal bruits or hernia , no rebound or guarding.   Extremities: No lower extremity edema. No clubbing or deformities. Neuro: Alert and oriented x 3.  Grossly intact. Skin: Warm and dry, no jaundice.   Psych: Alert and cooperative, normal mood and affect.  Labs:    Imaging Studies: No results found.  Assessment and Plan:   JERUSHA REISING is a 54 y.o. y/o female who comes in today with a history of multiple GI problems with stool incontinence and soft pasty stools.  The patient has been told to titrate Imodium and to increase her daily fiber with supplemental fiber.  The patient could not take Lomotil because she states that she was told she was allergic to it and had gotten hives.  She also went to pick up the Lotronex I had prescribed for her but she states that it was not covered by her insurance.  The patient will be sent off for  alpha-fetoprotein and does not need imaging at this time since she had a recent CT scan of the abdomen without any masses or lesions seen in the liver.  The patient will follow up in 6 months for her liver disease.  The patient has been explained the plan agrees with it.     Lucilla Lame, MD. Marval Regal    Note: This dictation was prepared with Dragon dictation along with smaller phrase technology. Any transcriptional errors that result from this process are unintentional.

## 2020-01-16 LAB — AFP TUMOR MARKER: AFP, Serum, Tumor Marker: 3 ng/mL (ref 0.0–8.3)

## 2020-01-22 DIAGNOSIS — E109 Type 1 diabetes mellitus without complications: Secondary | ICD-10-CM | POA: Diagnosis not present

## 2020-01-22 DIAGNOSIS — E139 Other specified diabetes mellitus without complications: Secondary | ICD-10-CM | POA: Diagnosis not present

## 2020-01-24 ENCOUNTER — Other Ambulatory Visit: Payer: Self-pay

## 2020-01-24 ENCOUNTER — Emergency Department: Payer: Medicare HMO

## 2020-01-24 ENCOUNTER — Emergency Department
Admission: EM | Admit: 2020-01-24 | Discharge: 2020-01-24 | Disposition: A | Discharge status: Home / Self Care | Payer: Medicare HMO | Attending: Emergency Medicine | Admitting: Emergency Medicine

## 2020-01-24 ENCOUNTER — Ambulatory Visit: Payer: Self-pay

## 2020-01-24 DIAGNOSIS — E1122 Type 2 diabetes mellitus with diabetic chronic kidney disease: Secondary | ICD-10-CM | POA: Insufficient documentation

## 2020-01-24 DIAGNOSIS — M79602 Pain in left arm: Secondary | ICD-10-CM | POA: Insufficient documentation

## 2020-01-24 DIAGNOSIS — R531 Weakness: Secondary | ICD-10-CM | POA: Diagnosis not present

## 2020-01-24 DIAGNOSIS — Z794 Long term (current) use of insulin: Secondary | ICD-10-CM | POA: Diagnosis not present

## 2020-01-24 DIAGNOSIS — R110 Nausea: Secondary | ICD-10-CM | POA: Insufficient documentation

## 2020-01-24 DIAGNOSIS — Z955 Presence of coronary angioplasty implant and graft: Secondary | ICD-10-CM | POA: Diagnosis not present

## 2020-01-24 DIAGNOSIS — E1165 Type 2 diabetes mellitus with hyperglycemia: Secondary | ICD-10-CM | POA: Insufficient documentation

## 2020-01-24 DIAGNOSIS — N189 Chronic kidney disease, unspecified: Secondary | ICD-10-CM | POA: Diagnosis not present

## 2020-01-24 DIAGNOSIS — E11649 Type 2 diabetes mellitus with hypoglycemia without coma: Secondary | ICD-10-CM | POA: Diagnosis not present

## 2020-01-24 DIAGNOSIS — T50B95A Adverse effect of other viral vaccines, initial encounter: Secondary | ICD-10-CM | POA: Diagnosis not present

## 2020-01-24 DIAGNOSIS — R5381 Other malaise: Secondary | ICD-10-CM | POA: Insufficient documentation

## 2020-01-24 DIAGNOSIS — Z79899 Other long term (current) drug therapy: Secondary | ICD-10-CM | POA: Diagnosis not present

## 2020-01-24 DIAGNOSIS — I129 Hypertensive chronic kidney disease with stage 1 through stage 4 chronic kidney disease, or unspecified chronic kidney disease: Secondary | ICD-10-CM | POA: Diagnosis not present

## 2020-01-24 DIAGNOSIS — T50Z95A Adverse effect of other vaccines and biological substances, initial encounter: Secondary | ICD-10-CM | POA: Diagnosis not present

## 2020-01-24 DIAGNOSIS — R079 Chest pain, unspecified: Secondary | ICD-10-CM | POA: Diagnosis not present

## 2020-01-24 DIAGNOSIS — R0789 Other chest pain: Secondary | ICD-10-CM | POA: Diagnosis not present

## 2020-01-24 DIAGNOSIS — R630 Anorexia: Secondary | ICD-10-CM | POA: Diagnosis not present

## 2020-01-24 DIAGNOSIS — Z8541 Personal history of malignant neoplasm of cervix uteri: Secondary | ICD-10-CM | POA: Insufficient documentation

## 2020-01-24 LAB — BASIC METABOLIC PANEL
Anion gap: 10 (ref 5–15)
BUN: 10 mg/dL (ref 6–20)
CO2: 30 mmol/L (ref 22–32)
Calcium: 7.9 mg/dL — ABNORMAL LOW (ref 8.9–10.3)
Chloride: 95 mmol/L — ABNORMAL LOW (ref 98–111)
Creatinine, Ser: 0.71 mg/dL (ref 0.44–1.00)
GFR, Estimated: >60 mL/min (ref >60)
Glucose, Bld: 278 mg/dL — ABNORMAL HIGH (ref 70–99)
Potassium: 2.9 mmol/L — ABNORMAL LOW (ref 3.5–5.1)
Sodium: 135 mmol/L (ref 135–145)

## 2020-01-24 LAB — CBC
HCT: 34.3 % — ABNORMAL LOW (ref 36.0–46.0)
Hemoglobin: 11.8 g/dL — ABNORMAL LOW (ref 12.0–15.0)
MCH: 29.2 pg (ref 26.0–34.0)
MCHC: 34.4 g/dL (ref 30.0–36.0)
MCV: 84.9 fL (ref 80.0–100.0)
Platelets: 215 10*3/uL (ref 150–400)
RBC: 4.04 MIL/uL (ref 3.87–5.11)
RDW: 12.2 % (ref 11.5–15.5)
WBC: 10.1 10*3/uL (ref 4.0–10.5)
nRBC: 0 % (ref 0.0–0.2)

## 2020-01-24 LAB — TROPONIN I (HIGH SENSITIVITY): Troponin I (High Sensitivity): 5 ng/L (ref <18)

## 2020-01-24 MED ORDER — PROMETHAZINE HCL 25 MG/ML IJ SOLN
25.0000 mg | Freq: Once | INTRAMUSCULAR | Status: AC
  Administered 2020-01-24: 25 mg via INTRAVENOUS
  Filled 2020-01-24: qty 1

## 2020-01-24 MED ORDER — HEPARIN SOD (PORK) LOCK FLUSH 100 UNIT/ML IV SOLN
INTRAVENOUS | Status: AC
  Administered 2020-01-24: 500 [IU] via INTRAVENOUS
  Filled 2020-01-24: qty 5

## 2020-01-24 MED ORDER — SODIUM CHLORIDE 0.9 % IV BOLUS: 500.0000 mL | Freq: Once | INTRAVENOUS | Status: DC

## 2020-01-24 MED ORDER — HEPARIN SOD (PORK) LOCK FLUSH 100 UNIT/ML IV SOLN: 500.0000 [IU] | Freq: Once | INTRAVENOUS | Status: AC

## 2020-01-24 NOTE — ED Notes (Signed)
Pt presents to ED with c/o of L axillary pain and L arm pain that started after her Moderna booster yesterday. Pt is A&Ox4. NAD noted. Pt states she has a port "because she's a hard stick and always here". Pt states feeling "not good" after the booster.

## 2020-01-24 NOTE — Discharge Instructions (Addendum)
Continue to get some rest, drink fluids and small sips to stay hydrated, gradually advance your diet, and follow-up with your primary care doctor as needed.  Return to the ER for new, worsening, or persistent severe weakness, nausea or vomiting, inability to hold anything down by mouth, chest pain, difficulty breathing, worsening swelling in the arm, or any other new or worsening symptoms that concern you.

## 2020-01-24 NOTE — ED Notes (Signed)
Pt's port deaccessed by this RN. Port flushed with heparin prior to access removal.

## 2020-01-24 NOTE — ED Triage Notes (Signed)
Pt to ED POV for chief complaint of centralized CP and shob that started approx 30 mins after receiving COVID booster yesterday afternoon.  +nausea.  Reports pain to left arm, received shot in left arm.  RR even and unlabored, speaking in complete sentences, NAD noted

## 2020-01-24 NOTE — ED Provider Notes (Signed)
[facility] Emergency Department Provider Note ____________________________________________   Event Date/Time   First MD Initiated Contact with Patient 01/24/20 1727     (approximate)  I have reviewed the triage vital signs and the nursing notes.   HISTORY  Chief Complaint Chest Pain and Shortness of Breath    HPI Amber Christensen is a 54 y.o. female with extensive PMH as noted below who presents with multiple symptoms, acute onset yesterday afternoon within about 30 minutes to 1 hour after receiving a Moderna COVID-19 booster vaccination.  The patient reports generalized weakness and malaise, left sided sharp chest pain, throbbing and pressure in the left arm where she received the vaccination, nausea, and decreased appetite.  The symptoms started yesterday afternoon into the evening and have continued today.  She denies any cough or shortness of breath, vomiting or diarrhea, or fever.  The patient received 2 doses of the Pfizer vaccine in March and April with some body aches and weakness with the second dose but no significant symptoms.  Past Medical History:  Diagnosis Date  . Allergy    many many allergies  . Anemia    in past  . Anginal pain ([place]) 01/2018   cardiac workup clear  . Anxiety   . 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[place])    pt unaware of this diagnosis  . Bipolar disorder ([place])   . Bladder mass    removed and had tbt. mesh removed  . Blood transfusion without reported diagnosis 1999   received 20 units of blood after tearing esophagus from vomitting so severely after ERCP  . Breast mass    left side biopsy was negative  . Carpal tunnel syndrome    left hand  . Cervical cancer ([place]) 2002  . Chronic kidney disease    cyst on left kidney  . Chronic pancreatitis ([place])    prior to pancreas surgery  . Cirrhosis ([place])   . Clotting disorder ([place])    pt states that sometimes she has difficulty stopping to bleed and other times it is okay  .  Depression   . Diabetes mellitus without complication (HCC)    has insulin pump  . Dyspnea   . Esophagitis   . GERD (gastroesophageal reflux disease)   . Headache    migraines...takes compazine for this  . Heart disease   . 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 Hypertension   . Irritable bowel syndrome   . Kidney mass    just watching. left kidney mass.  . Liver disease    NASH. has had liver biopsies. negative except for NASH  . NASH (nonalcoholic steatohepatitis)   . NASH (nonalcoholic steatohepatitis)   . Pancreatitis   . PONV (postoperative nausea and vomiting)   . Stroke ([place]) 11/2017   had tia. no longer needs plavix  . Thyroid disease     Patient Active Problem List   Diagnosis Date Noted  . Port-A-Cath in place 10/08/2019  . Abdominal aortic atherosclerosis ([place]) 10/02/2019  . Breast mass 10/02/2019  . Hives 10/02/2019  . Hypoglycemia 10/02/2019  . Irritable bowel syndrome 10/02/2019  . Bladder mass 10/02/2019  . Left sided abdominal pain of unknown cause 10/02/2019  . Migraine 10/02/2019  . NASH (nonalcoholic steatohepatitis) 10/02/2019  . Acute diarrhea 05/10/2019  . Hyperglycemia 09/23/2018  . S/P ventral herniorrhaphy 03/15/2018  . Ventral hernia without obstruction or gangrene   .  Leg cramps 03/08/2018  . History of TIA (transient ischemic attack) 12/14/2017  . Vitamin B 12 deficiency 12/14/2017  . Atypical squamous cells of undetermined significance (ASCUS) on Papanicolaou smear of cervix 08/29/2017  . Chronic fatigue 06/06/2017  . Early satiety 06/06/2017  . Hypocalcemia 06/06/2017  . Hypertension 03/21/2017  . Diabetes mellitus without complication ([place]) 93/81/8299  . Pancreatitis 03/21/2017  . Poor venous access 03/21/2017  . Steatorrhea 03/10/2017  . Frontal headache 12/08/2016  . Poorly-controlled hypertension 11/08/2016  . Encounter for long-term (current) use of high-risk  medication 09/01/2016  . Low blood pressure reading 09/01/2016  . Vasculitis ([place]) 08/03/2016  . Hypokalemia 06/10/2016  . Acute neck pain 05/16/2016  . Acute maculopapular rash 04/21/2016  . Maculopapular rash, generalized 04/20/2016  . Vaginal bleeding, abnormal 02/18/2016  . Unstable angina ([place]) 09/28/2015  . Hypomagnesemia 03/03/2015  . Long-term insulin use ([place]) 04/20/2014    Past Surgical History:  Procedure Laterality Date  . ABDOMINAL HYSTERECTOMY    . APPENDECTOMY    . BLADDER SURGERY     mesh removed. revision which has caused everything to fall again  . BOWEL RESECTION     took a piece of bowel out after pancreatectomy caused problem with bowel.   Marland Kitchen BREAST BIOPSY Left 1989   benign  . CARDIAC CATHETERIZATION  [id number], 1998   no stents, fatty streaks  . CHOLECYSTECTOMY    . COLON SURGERY    . COLONOSCOPY WITH PROPOFOL N/A 10/08/2018   Procedure: COLONOSCOPY WITH PROPOFOL;  Surgeon: Lollie Sails, MD;  Location: [facility] ENDOSCOPY;  Service: Endoscopy;  Laterality: N/A;  . CYST REMOVAL HAND Left 1992   Ganglion cyst removed from Left Wrist  . CYSTECTOMY    . CYSTO WITH HYDRODISTENSION N/A 03/04/2019   Procedure: CYSTOSCOPY/HYDRODISTENSION;  Surgeon: Bjorn Loser, MD;  Location: [facility] ORS;  Service: Urology;  Laterality: N/A;  . DILATATION & CURETTAGE/HYSTEROSCOPY WITH MYOSURE    . DILATION AND CURETTAGE OF UTERUS    . ERCP    . ESOPHAGOGASTRODUODENOSCOPY    . HERNIA REPAIR    . KNEE ARTHROSCOPY Left 1992  . LEFT HEART CATH AND CORONARY ANGIOGRAPHY Left 02/06/2019   Procedure: LEFT HEART CATH AND CORONARY ANGIOGRAPHY;  Surgeon: Yolonda Kida, MD;  Location: [place] CV LAB;  Service: Cardiovascular;  Laterality: Left;  . LIVER BIOPSY    . PANCREAS SURGERY  2006   partial pancreatectomy after endoscope knicked a part of pancreas.   Marland Kitchen PORTA CATH INSERTION N/A 04/10/2017   Procedure: PORTA CATH INSERTION;  Surgeon: Algernon Huxley, MD;  Location: [place] [facility];  Service: Cardiovascular;  Laterality: N/A;  . PORTA CATH INSERTION N/A 02/21/2019   Procedure: PORTA CATH INSERTION;  Surgeon: Algernon Huxley, MD;  Location: [place] [facility];  Service: Cardiovascular;  Laterality: N/A;  . REPAIR OF ESOPHAGUS  2012   3 clamps placed in esophagus  . ROBOTIC ASSISTED LAPAROSCOPIC VENTRAL/INCISIONAL HERNIA REPAIR N/A 03/15/2018   Procedure: ROBOTIC ASSISTED LAPAROSCOPIC VENTRAL HERNIA REPAIR;  Surgeon: Jules Husbands, MD;  Location: [facility] ORS;  Service: General;  Laterality: N/A;  . SMALL BOWEL REPAIR    . TONSILLECTOMY      Prior to Admission medications   Medication Sig Start Date End Date Taking? Authorizing Provider  amLODipine (NORVASC) 5 MG tablet Take 5 mg by mouth daily.    [provider]  Calcium Carb-Cholecalciferol (CALCIUM 600/VITAMIN D3 PO) Take 1 tablet by mouth daily.    [provider]  Cholecalciferol (VITAMIN D-3) 5000 units TABS Take 5,000 Units by mouth daily.    [provider]  Continuous Blood Gluc Receiver (DEXCOM G6 RECEIVER) DEVI Use to monitor blood sugar. [facility] [id number] 08/30/19   [provider]  CONTOUR NEXT TEST test strip 1 each 4 (four) times daily. 07/05/19   [provider]  cyanocobalamin 1000 MCG tablet Take by mouth.    [provider]  EPINEPHrine 0.3 mg/0.3 mL IJ SOAJ injection SMARTSIG:1 Pre-Filled Pen Syringe IM PRN 05/31/19   [provider]  gabapentin (NEURONTIN) 300 MG capsule Take 600 mg by mouth 3 (three) times daily. 08/30/19   [provider]  insulin lispro (HUMALOG) 100 UNIT/ML injection USE UP TO 70 UNITS SUBCUTANEOUSLY PER DAY VIA INSULIN PUMP. Dx code e10.65 05/14/19   [provider]  LACTOBACILLUS PO Take by mouth.    [provider]  lipase/protease/amylase (CREON) 36000 UNITS CPEP capsule Take 144,000 Units by mouth 3 (three) times daily before meals.     [provider]  lisinopril (ZESTRIL)  40 MG tablet Take 40 mg by mouth daily. 09/03/18   [provider]  Magnesium (V-R MAGNESIUM) 250 MG TABS Take 250 mg by mouth daily.     [provider]  metoprolol succinate (TOPROL-XL) 25 MG 24 hr tablet Take 25 mg by mouth daily.    [provider]  MITIGARE 0.6 MG CAPS Take 1 capsule by mouth 2 (two) times daily. 04/09/19   [provider]  oxyCODONE-acetaminophen (PERCOCET) 5-325 MG tablet Take 1 tablet by mouth every 8 (eight) hours as needed. 09/09/19   Earleen Newport, MD  pantoprazole (PROTONIX) 40 MG tablet Take 1 tablet (40 mg total) by mouth daily. 07/26/19 07/25/20  Nena Polio, MD  potassium chloride (KLOR-CON) 10 MEQ tablet Take 10 mEq by mouth 2 (two) times daily. 09/20/19   [provider]  vitamin E 400 UNIT capsule Take 400 Units by mouth daily.    [provider]    Allergies Ciprofloxacin, Demerol [meperidine], Dilaudid [hydromorphone hcl], Metoclopramide, Morphine and related, Morpholine salicylate, Isosorbide nitrate, Sulfa antibiotics, Flagyl [metronidazole], Adhesive [tape], Darvon [propoxyphene], Flexeril [cyclobenzaprine], Levaquin [levofloxacin in d5w], Naproxen, Soma [carisoprodol], and Zofran [ondansetron hcl]  Family History  Problem Relation Age of Onset  . Cirrhosis Mother   . Colon cancer Mother 31       TAH/BSO in ~74; deceased at 71  . Hypertension Mother   . Breast cancer Mother 80       unconfirmed  . Hypertension Father   . Prostate cancer Father 34       currently 55  . Other Father        liver disease  . Breast cancer Maternal Aunt        age at dx unk.; currently 2  . Cervical cancer Maternal Aunt   . Cancer Maternal Uncle        unk. type; currently 77s  . Stomach cancer Paternal Aunt 74       deceased at 8  . Breast cancer Maternal Grandmother 35       possibly bilateral in 61s; deceased 59  . Non-Hodgkin's lymphoma Daughter        unconfirmed; currently 36  . Cancer Maternal  Aunt        hematologic malignancy; deceased 81    Social History Social History   Tobacco Use  . Smoking status: Never Smoker  . Smokeless tobacco: Never Used  Vaping  Use  . Vaping Use: Never used  Substance Use Topics  . Alcohol use: Yes    Comment: rarely  . Drug use: No    Review of Systems  Constitutional: No fever/chills.  Positive for weakness and malaise. Eyes: No visual changes. ENT: No sore throat. Cardiovascular: Positive for chest pain. Respiratory: Denies shortness of breath. Gastrointestinal: Positive for nausea. Genitourinary: Negative for dysuria.  Musculoskeletal: Positive for body aches. Skin: Negative for rash. Neurological: Negative for headache.   ____________________________________________   PHYSICAL EXAM:  VITAL SIGNS: ED Triage Vitals  Enc Vitals Group     BP 01/24/20 1423 (!) 187/96     Pulse Rate 01/24/20 1423 88     Resp 01/24/20 1423 20     Temp 01/24/20 1423 99.5 F (37.5 C)     Temp Source 01/24/20 1423 Oral     SpO2 01/24/20 1423 100 %     Weight 01/24/20 1424 163 lb 2.3 oz (74 kg)     Height 01/24/20 1424 5' 3"  (1.6 m)     Head Circumference --      Peak Flow --      Pain Score 01/24/20 1424 10     Pain Loc --      Pain Edu? --      Excl. in [place]? --     Constitutional: Alert and oriented. Well appearing and in no acute distress. Eyes: Conjunctivae are normal.  Head: Atraumatic. Nose: No congestion/rhinnorhea. Mouth/Throat: Mucous membranes are slightly dry. Neck: Normal range of motion.  Cardiovascular: Normal rate, regular rhythm. Grossly normal heart sounds.  Good peripheral circulation. Respiratory: Normal respiratory effort.  No retractions. Lungs CTAB. Gastrointestinal: No distention.  Musculoskeletal: No lower extremity edema.  Mild soft tissue tenderness to the left proximal arm with no erythema, induration, or abnormal warmth.  Extremities warm and well perfused.  Neurologic:  Normal speech and language. No  gross focal neurologic deficits are appreciated.  Skin:  Skin is warm and dry. No rash noted. Psychiatric: Mood and affect are normal. Speech and behavior are normal.  ____________________________________________   LABS (all labs ordered are listed, but only abnormal results are displayed)  Labs Reviewed  BASIC METABOLIC PANEL - Abnormal; Notable for the following components:      Result Value   Potassium 2.9 (*)    Chloride 95 (*)    Glucose, Bld 278 (*)    Calcium 7.9 (*)    All other components within normal limits  CBC - Abnormal; Notable for the following components:   Hemoglobin 11.8 (*)    HCT 34.3 (*)    All other components within normal limits  TROPONIN I (HIGH SENSITIVITY)   ____________________________________________  EKG  ED ECG REPORT I, Arta Silence, the attending physician, personally viewed and interpreted this ECG.  Date: 01/24/2020 EKG Time: 1302 Rate: 90 Rhythm: normal sinus rhythm QRS Axis: normal Intervals: normal ST/T Wave abnormalities: normal Narrative Interpretation: no evidence of acute ischemia  ____________________________________________  RADIOLOGY    ____________________________________________   PROCEDURES  Procedure(s) performed: No  Procedures  Critical Care performed: No ____________________________________________   INITIAL IMPRESSION / ASSESSMENT AND PLAN / ED COURSE  Pertinent labs & imaging results that were available during my care of the patient were reviewed by me and considered in my medical decision making (see chart for details).  54 year old female with extensive PMH as noted above including diabetes, autoimmune disorder, and NASH cirrhosis presents with multiple symptoms including weakness, body aches, and nausea after receiving  a COVID-19 Moderna booster vaccination yesterday.  On exam, the patient is overall well-appearing.  Her vital signs are normal except for hypertension and borderline  elevated temperature.  The physical exam is otherwise unremarkable except for mild tenderness to the soft tissue of the left upper arm where she received the vaccination with no other abnormalities.  Overall presentation is consistent with normal vaccine side effects and a vigorous immune response.  There is no evidence of allergic reaction.  EKG is normal and I have a low suspicion for an acute cardiac etiology of the chest pain however there have been reports of myocarditis (primarily in younger patients) after the [place] vaccination.  Given the duration of the symptoms and the patient's comorbidities, as well as the presence of chest pain, I think it would be reasonable to obtain basic labs, a troponin, and give some IV fluids.  If this work-up is negative I anticipate discharge home.  ----------------------------------------- 8:38 PM on 01/24/2020 -----------------------------------------  Lab work-up is unremarkable except for borderline hypokalemia which appears to be baseline for the patient.  Troponin is negative, and given the duration of the symptoms and normal EKG there is no indication for a repeat.  The patient is feeling a bit better after fluids.  The nausea has improved.  At this time, she is stable for discharge.  Return precautions given, and she expresses understanding.  ____________________________________________   FINAL CLINICAL IMPRESSION(S) / ED DIAGNOSES  Final diagnoses:  Vaccine reaction, initial encounter      NEW MEDICATIONS STARTED DURING THIS VISIT:  New Prescriptions   No medications on file     Note:  This document was prepared using Dragon voice recognition software and may include unintentional dictation errors.    Arta Silence, MD 01/24/20 2039

## 2020-01-24 NOTE — Telephone Encounter (Signed)
Patient called stating that after her moderna vaccine yesterday she started to have chest pain.  She states that she has had heart issues in past and has had 2 catherizations.  She states the pain stops her from taking deep breaths and is constant.  She rates the pain at 7-8. She states that this pain is center chest.  She states that she always has issues with her neck so hard to say if it is normal neck pain or new.  She states she has low grade fever and injection site pain. Per protocol patie t will go to ER for evaluation of Chest pain and tightness.  Care advice was read to patient.  She verbalized understanding. Reason for Disposition . SEVERE chest pain  Answer Assessment - Initial Assessment Questions 1. LOCATION: "Where does it hurt?"       Center chest 2. RADIATION: "Does the pain go anywhere else?" (e.g., into neck, jaw, arms, back)    no 3. ONSET: "When did the chest pain begin?" (Minutes, hours or days)      1/2 hour after moderna 4. PATTERN "Does the pain come and go, or has it been constant since it started?"  "Does it get worse with exertion?"      constant 5. DURATION: "How long does it last" (e.g., seconds, minutes, hours)     Always there 6. SEVERITY: "How bad is the pain?"  (e.g., Scale 1-10; mild, moderate, or severe)    - MILD (1-3): doesn't interfere with normal activities     - MODERATE (4-7): interferes with normal activities or awakens from sleep    - SEVERE (8-10): excruciating pain, unable to do any normal activities       7-8 7. CARDIAC RISK FACTORS: "Do you have any history of heart problems or risk factors for heart disease?" (e.g., angina, prior heart attack; diabetes, high blood pressure, high cholesterol, smoker, or strong family history of heart disease)     all 8. PULMONARY RISK FACTORS: "Do you have any history of lung disease?"  (e.g., blood clots in lung, asthma, emphysema, birth control pills)     no 9. CAUSE: "What do you think is causing the chest  pain?"    moderna 10. OTHER SYMPTOMS: "Do you have any other symptoms?" (e.g., dizziness, nausea, vomiting, sweating, fever, difficulty breathing, cough)       Nauseated, fever low grade, difficult to take deep breath 11. PREGNANCY: "Is there any chance you are pregnant?" "When was your last menstrual period?"       N/A  Protocols used: CHEST PAIN-A-AH

## 2020-01-24 NOTE — ED Notes (Signed)
Refused to be stuck for blood until port accessed

## 2020-02-04 ENCOUNTER — Ambulatory Visit
Admission: RE | Admit: 2020-02-04 | Discharge: 2020-02-04 | Disposition: A | Discharge status: Home / Self Care | Payer: Medicare HMO | Source: Ambulatory Visit | Attending: Vascular Surgery | Admitting: Vascular Surgery

## 2020-02-18 DIAGNOSIS — Z20822 Contact with and (suspected) exposure to covid-19: Secondary | ICD-10-CM | POA: Diagnosis not present

## 2020-02-18 DIAGNOSIS — R197 Diarrhea, unspecified: Secondary | ICD-10-CM | POA: Diagnosis not present

## 2020-02-18 DIAGNOSIS — R110 Nausea: Secondary | ICD-10-CM | POA: Diagnosis not present

## 2020-02-18 DIAGNOSIS — R058 Other specified cough: Secondary | ICD-10-CM | POA: Diagnosis not present

## 2020-02-20 DIAGNOSIS — E1159 Type 2 diabetes mellitus with other circulatory complications: Secondary | ICD-10-CM | POA: Diagnosis not present

## 2020-02-24 DIAGNOSIS — Z8719 Personal history of other diseases of the digestive system: Secondary | ICD-10-CM | POA: Diagnosis not present

## 2020-02-24 DIAGNOSIS — N9489 Other specified conditions associated with female genital organs and menstrual cycle: Secondary | ICD-10-CM | POA: Diagnosis not present

## 2020-02-24 DIAGNOSIS — N952 Postmenopausal atrophic vaginitis: Secondary | ICD-10-CM | POA: Insufficient documentation

## 2020-02-24 DIAGNOSIS — R32 Unspecified urinary incontinence: Secondary | ICD-10-CM | POA: Diagnosis not present

## 2020-02-24 DIAGNOSIS — R350 Frequency of micturition: Secondary | ICD-10-CM | POA: Diagnosis not present

## 2020-02-24 DIAGNOSIS — L900 Lichen sclerosus et atrophicus: Secondary | ICD-10-CM | POA: Insufficient documentation

## 2020-02-24 DIAGNOSIS — N941 Unspecified dyspareunia: Secondary | ICD-10-CM | POA: Diagnosis not present

## 2020-02-24 DIAGNOSIS — N3941 Urge incontinence: Secondary | ICD-10-CM | POA: Insufficient documentation

## 2020-02-24 DIAGNOSIS — R159 Full incontinence of feces: Secondary | ICD-10-CM | POA: Diagnosis not present

## 2020-02-24 DIAGNOSIS — R3915 Urgency of urination: Secondary | ICD-10-CM | POA: Diagnosis not present

## 2020-02-24 DIAGNOSIS — R102 Pelvic and perineal pain: Secondary | ICD-10-CM | POA: Diagnosis not present

## 2020-04-11 ENCOUNTER — Observation Stay: Payer: Medicare HMO

## 2020-04-11 ENCOUNTER — Other Ambulatory Visit: Payer: Self-pay

## 2020-04-11 ENCOUNTER — Emergency Department: Payer: Medicare HMO

## 2020-04-11 ENCOUNTER — Observation Stay
Admission: EM | Admit: 2020-04-11 | Discharge: 2020-04-12 | Disposition: A | Discharge status: Home / Self Care | Payer: Medicare HMO | Attending: Internal Medicine | Admitting: Internal Medicine

## 2020-04-11 DIAGNOSIS — Z8541 Personal history of malignant neoplasm of cervix uteri: Secondary | ICD-10-CM | POA: Insufficient documentation

## 2020-04-11 DIAGNOSIS — N189 Chronic kidney disease, unspecified: Secondary | ICD-10-CM | POA: Diagnosis not present

## 2020-04-11 DIAGNOSIS — E109 Type 1 diabetes mellitus without complications: Secondary | ICD-10-CM | POA: Diagnosis not present

## 2020-04-11 DIAGNOSIS — R29898 Other symptoms and signs involving the musculoskeletal system: Secondary | ICD-10-CM

## 2020-04-11 DIAGNOSIS — E119 Type 2 diabetes mellitus without complications: Secondary | ICD-10-CM | POA: Diagnosis not present

## 2020-04-11 DIAGNOSIS — I160 Hypertensive urgency: Secondary | ICD-10-CM | POA: Diagnosis present

## 2020-04-11 DIAGNOSIS — Z79899 Other long term (current) drug therapy: Secondary | ICD-10-CM | POA: Insufficient documentation

## 2020-04-11 DIAGNOSIS — N2889 Other specified disorders of kidney and ureter: Secondary | ICD-10-CM

## 2020-04-11 DIAGNOSIS — Z90710 Acquired absence of both cervix and uterus: Secondary | ICD-10-CM | POA: Diagnosis not present

## 2020-04-11 DIAGNOSIS — Z9049 Acquired absence of other specified parts of digestive tract: Secondary | ICD-10-CM | POA: Diagnosis not present

## 2020-04-11 DIAGNOSIS — K7581 Nonalcoholic steatohepatitis (NASH): Secondary | ICD-10-CM | POA: Diagnosis not present

## 2020-04-11 DIAGNOSIS — E139 Other specified diabetes mellitus without complications: Secondary | ICD-10-CM | POA: Diagnosis not present

## 2020-04-11 DIAGNOSIS — Z20822 Contact with and (suspected) exposure to covid-19: Secondary | ICD-10-CM | POA: Diagnosis not present

## 2020-04-11 DIAGNOSIS — Z794 Long term (current) use of insulin: Secondary | ICD-10-CM

## 2020-04-11 DIAGNOSIS — R531 Weakness: Secondary | ICD-10-CM | POA: Diagnosis not present

## 2020-04-11 DIAGNOSIS — R0789 Other chest pain: Secondary | ICD-10-CM | POA: Diagnosis not present

## 2020-04-11 DIAGNOSIS — R55 Syncope and collapse: Principal | ICD-10-CM | POA: Diagnosis present

## 2020-04-11 DIAGNOSIS — I10 Essential (primary) hypertension: Secondary | ICD-10-CM | POA: Diagnosis present

## 2020-04-11 DIAGNOSIS — R5382 Chronic fatigue, unspecified: Secondary | ICD-10-CM | POA: Diagnosis not present

## 2020-04-11 DIAGNOSIS — I129 Hypertensive chronic kidney disease with stage 1 through stage 4 chronic kidney disease, or unspecified chronic kidney disease: Secondary | ICD-10-CM | POA: Insufficient documentation

## 2020-04-11 DIAGNOSIS — Z95828 Presence of other vascular implants and grafts: Secondary | ICD-10-CM | POA: Diagnosis not present

## 2020-04-11 DIAGNOSIS — R6881 Early satiety: Secondary | ICD-10-CM | POA: Diagnosis present

## 2020-04-11 DIAGNOSIS — N281 Cyst of kidney, acquired: Secondary | ICD-10-CM | POA: Diagnosis not present

## 2020-04-11 DIAGNOSIS — R0602 Shortness of breath: Secondary | ICD-10-CM | POA: Diagnosis not present

## 2020-04-11 DIAGNOSIS — K760 Fatty (change of) liver, not elsewhere classified: Secondary | ICD-10-CM | POA: Diagnosis not present

## 2020-04-11 DIAGNOSIS — R079 Chest pain, unspecified: Secondary | ICD-10-CM | POA: Diagnosis not present

## 2020-04-11 DIAGNOSIS — R42 Dizziness and giddiness: Secondary | ICD-10-CM | POA: Diagnosis not present

## 2020-04-11 LAB — CBC
HCT: 40.5 % (ref 36.0–46.0)
Hemoglobin: 14 g/dL (ref 12.0–15.0)
MCH: 28.5 pg (ref 26.0–34.0)
MCHC: 34.6 g/dL (ref 30.0–36.0)
MCV: 82.5 fL (ref 80.0–100.0)
Platelets: 198 10*3/uL (ref 150–400)
RBC: 4.91 MIL/uL (ref 3.87–5.11)
RDW: 12.8 % (ref 11.5–15.5)
WBC: 7 10*3/uL (ref 4.0–10.5)
nRBC: 0 % (ref 0.0–0.2)

## 2020-04-11 LAB — BASIC METABOLIC PANEL
Anion gap: 12 (ref 5–15)
BUN: 10 mg/dL (ref 6–20)
CO2: 27 mmol/L (ref 22–32)
Calcium: 9.1 mg/dL (ref 8.9–10.3)
Chloride: 100 mmol/L (ref 98–111)
Creatinine, Ser: 0.76 mg/dL (ref 0.44–1.00)
GFR, Estimated: >60 mL/min (ref >60)
Glucose, Bld: 196 mg/dL — ABNORMAL HIGH (ref 70–99)
Potassium: 3.2 mmol/L — ABNORMAL LOW (ref 3.5–5.1)
Sodium: 139 mmol/L (ref 135–145)

## 2020-04-11 LAB — HEPATIC FUNCTION PANEL
ALT: 42 U/L (ref 0–44)
AST: 35 U/L (ref 15–41)
Albumin: 4.4 g/dL (ref 3.5–5.0)
Alkaline Phosphatase: 139 U/L — ABNORMAL HIGH (ref 38–126)
Bilirubin, Direct: 0.3 mg/dL — ABNORMAL HIGH (ref 0.0–0.2)
Indirect Bilirubin: 3.9 mg/dL — ABNORMAL HIGH (ref 0.3–0.9)
Total Bilirubin: 4.2 mg/dL — ABNORMAL HIGH (ref 0.3–1.2)
Total Protein: 7.9 g/dL (ref 6.5–8.1)

## 2020-04-11 LAB — POC URINE PREG, ED: Preg Test, Ur: NEGATIVE

## 2020-04-11 LAB — PHOSPHORUS: Phosphorus: 4.3 mg/dL (ref 2.5–4.6)

## 2020-04-11 LAB — GLUCOSE, CAPILLARY: Glucose-Capillary: 246 mg/dL — ABNORMAL HIGH (ref 70–99)

## 2020-04-11 LAB — TSH: TSH: 2.748 u[IU]/mL (ref 0.350–4.500)

## 2020-04-11 LAB — TROPONIN I (HIGH SENSITIVITY)
Troponin I (High Sensitivity): 4 ng/L (ref <18)
Troponin I (High Sensitivity): 5 ng/L (ref <18)

## 2020-04-11 LAB — MAGNESIUM: Magnesium: 1.8 mg/dL (ref 1.7–2.4)

## 2020-04-11 MED ORDER — VITAMIN B-12 1000 MCG PO TABS
1000.0000 ug | ORAL_TABLET | Freq: Every day | ORAL | Status: DC
  Administered 2020-04-12: 1000 ug via ORAL
  Filled 2020-04-11: qty 1

## 2020-04-11 MED ORDER — INSULIN ASPART 100 UNIT/ML ~~LOC~~ SOLN: 0.0000 [IU] | Freq: Every day | SUBCUTANEOUS | Status: DC

## 2020-04-11 MED ORDER — ENOXAPARIN SODIUM 40 MG/0.4ML ~~LOC~~ SOLN
40.0000 mg | SUBCUTANEOUS | Status: DC
  Administered 2020-04-11: 40 mg via SUBCUTANEOUS
  Filled 2020-04-11: qty 0.4

## 2020-04-11 MED ORDER — OXYCODONE-ACETAMINOPHEN 5-325 MG PO TABS
1.0000 | ORAL_TABLET | Freq: Once | ORAL | Status: AC
  Administered 2020-04-11: 1 via ORAL
  Filled 2020-04-11: qty 1

## 2020-04-11 MED ORDER — METOPROLOL SUCCINATE ER 50 MG PO TB24
25.0000 mg | ORAL_TABLET | Freq: Every day | ORAL | Status: DC
Start: 2020-04-12 — End: 2020-04-12
  Administered 2020-04-12: 25 mg via ORAL
  Filled 2020-04-11: qty 1

## 2020-04-11 MED ORDER — HYDRALAZINE HCL 20 MG/ML IJ SOLN
10.0000 mg | Freq: Four times a day (QID) | INTRAMUSCULAR | Status: DC | PRN
  Administered 2020-04-11: 10 mg via INTRAVENOUS
  Filled 2020-04-11: qty 1

## 2020-04-11 MED ORDER — VITAMIN D 25 MCG (1000 UNIT) PO TABS
5000.0000 [IU] | ORAL_TABLET | Freq: Every day | ORAL | Status: DC
  Administered 2020-04-12: 5000 [IU] via ORAL
  Filled 2020-04-11: qty 5

## 2020-04-11 MED ORDER — ACETAMINOPHEN 650 MG RE SUPP: 325.0000 mg | Freq: Four times a day (QID) | RECTAL | Status: DC | PRN

## 2020-04-11 MED ORDER — PENTOSAN POLYSULFATE SODIUM 100 MG PO CAPS
100.0000 mg | ORAL_CAPSULE | Freq: Three times a day (TID) | ORAL | Status: DC
  Administered 2020-04-12: 100 mg via ORAL
  Filled 2020-04-11 (×4): qty 1

## 2020-04-11 MED ORDER — PANTOPRAZOLE SODIUM 40 MG PO TBEC
40.0000 mg | DELAYED_RELEASE_TABLET | Freq: Every day | ORAL | Status: DC
  Administered 2020-04-12: 40 mg via ORAL
  Filled 2020-04-11: qty 1

## 2020-04-11 MED ORDER — PANCRELIPASE (LIP-PROT-AMYL) 12000-38000 UNITS PO CPEP
144000.0000 [IU] | ORAL_CAPSULE | Freq: Three times a day (TID) | ORAL | Status: DC
  Administered 2020-04-12 (×2): 144000 [IU] via ORAL
  Filled 2020-04-11 (×4): qty 12

## 2020-04-11 MED ORDER — AMITRIPTYLINE HCL 25 MG PO TABS
25.0000 mg | ORAL_TABLET | Freq: Every day | ORAL | Status: DC
  Filled 2020-04-11: qty 1

## 2020-04-11 MED ORDER — VITAMIN E 45 MG (100 UNIT) PO CAPS
400.0000 [IU] | ORAL_CAPSULE | Freq: Every day | ORAL | Status: DC
  Administered 2020-04-12: 400 [IU] via ORAL
  Filled 2020-04-11 (×2): qty 4

## 2020-04-11 MED ORDER — OXYCODONE-ACETAMINOPHEN 5-325 MG PO TABS
1.0000 | ORAL_TABLET | Freq: Three times a day (TID) | ORAL | Status: DC | PRN
  Administered 2020-04-12: 1 via ORAL
  Filled 2020-04-11: qty 1

## 2020-04-11 MED ORDER — INSULIN ASPART 100 UNIT/ML ~~LOC~~ SOLN: 0.0000 [IU] | Freq: Three times a day (TID) | SUBCUTANEOUS | Status: DC

## 2020-04-11 MED ORDER — POTASSIUM CITRATE-CITRIC ACID 1100-334 MG/5ML PO SOLN
30.0000 meq | Freq: Once | ORAL | Status: DC
  Filled 2020-04-11: qty 15

## 2020-04-11 MED ORDER — LISINOPRIL 20 MG PO TABS
40.0000 mg | ORAL_TABLET | Freq: Every day | ORAL | Status: DC
  Administered 2020-04-12: 40 mg via ORAL
  Filled 2020-04-11: qty 2

## 2020-04-11 MED ORDER — IOHEXOL 350 MG/ML SOLN
100.0000 mL | Freq: Once | INTRAVENOUS | Status: AC | PRN
  Administered 2020-04-11: 100 mL via INTRAVENOUS

## 2020-04-11 MED ORDER — ACETAMINOPHEN 325 MG PO TABS
325.0000 mg | ORAL_TABLET | Freq: Four times a day (QID) | ORAL | Status: DC | PRN
  Administered 2020-04-11: 325 mg via ORAL
  Filled 2020-04-11: qty 1

## 2020-04-11 MED ORDER — AMLODIPINE BESYLATE 5 MG PO TABS
5.0000 mg | ORAL_TABLET | Freq: Every day | ORAL | Status: DC
Start: 2020-04-11 — End: 2020-04-12
  Administered 2020-04-12: 5 mg via ORAL
  Filled 2020-04-11: qty 1

## 2020-04-11 MED ORDER — GABAPENTIN 300 MG PO CAPS
600.0000 mg | ORAL_CAPSULE | Freq: Three times a day (TID) | ORAL | Status: DC
  Administered 2020-04-12: 600 mg via ORAL
  Filled 2020-04-11: qty 2

## 2020-04-11 NOTE — H&P (Addendum)
History and Physical   Amber Christensen FAO:[id number] DOB: 11-16-1965 DOA: 04/11/2020  PCP: [name], MD  Outpatient Specialists: Dr. Allen Norris, gastroenterologist Patient coming from: Home  I have personally briefly reviewed patient's old medical records in [place].  Chief Concern: Passing out  HPI: Amber Christensen is a 55 y.o. female with medical history significant for  chronic diarrhea, hypertension, ulcerative colitis, baseline urinary incontinence, baseline occasionally stooling on her self, B12 deficiency, GERD, insulin-dependent diabetes mellitus on insulin pump and has a CGM, diabetic neuropathy, presented to the emergency department for chief concerns of passing out.  At bedside, patient was awake alert and oriented to self, age, location, current calendar year.    On 04/09/20 her husband mentioned that she was behaving strangely.  Patient was about to put fresh milk in the closet instead of the refrigerator.  She reports left sided, both upper and lower extremities, numbness started 04/11/20 at approximately 9 AM. She endorses difficulty ambulating.  This prompted her to attempt to drive herself to the emergency department for further evaluation.  She was backing out of driveway and she remembers pressing her car break, putting her car back in park because she felt dizzy.  The next thing she knew, her neighbor was knocking on her car window telling her that everything was okay and that she had called for EMS.    Patient felt confused when she heard this from her neighbor and her neighbor told her and EMS that she saw the patient in her car, holding to her chest, and then slumping over the steering wheel.  The patient reports that she did not remember any of this.  She endorses squeezing and heaviness on her chest 4/10 and before it was 6/10. Persistent that started 8 AM on 04/11/2020. She endorses shortness of breath. She endorses never feeling this way before.  She  endorses nausea and denies vomiting.  She denies changes to diet/medications or sick contacts in the last two weeks.  She endorses compliance with her blood pressure medications.  Social history: lives with spouse. Denies history of tobacco, recreational drug use. Occassional etoh, last drink was Thursday, night with dinner, one glass of red wine at [employer].   Vaccination: Patient endorses being fully vaccinated for COVID-19.  Pfizer 05/15/2019 and 06/05/2019  ROS: Constitutional: + weight change (gain), no fever ENT/Mouth: no sore throat, no rhinorrhea Eyes: no eye pain, no vision changes Cardiovascular: no chest pain, no dyspnea,  no edema, no palpitations Respiratory: no cough, no sputum, no wheezing Gastrointestinal: + nausea, no vomiting, + diarrhea (IBS), no constipation Genitourinary: no urinary incontinence, no dysuria, no hematuria Musculoskeletal: no arthralgias, no myalgias Skin: no skin lesions, no pruritus, Neuro: + weakness, no loss of consciousness, no syncope Psych: no anxiety, no depression, + decrease appetite Heme/Lymph: no bruising, no bleeding  ED Course: Discussed with ED provider, patient requiring hospitalization due to syncopal episode.  Vitals in the emergency department was afebrile with temperature of 98.6, respiration rate of 15, heart rate 81, blood pressure 187/100, satting at 96% on room air.  Assessment/Plan  Principal Problem:   Syncope and collapse Active Problems:   Hypertension   Chronic fatigue   Early satiety   Long-term insulin use (HCC)   NASH (nonalcoholic steatohepatitis)   Port-A-Cath in place   Hypertensive urgency   # Syncope and collapse # Weakness -CT of the head was ordered by ED provider and was read as no acute intracranial pathology -MRI of the  brain without contrast was ordered and was read as no acute findings by MRI.  Moderate chronic small vessel ischemic changes of the cerebral hemispheric white matter, minimally  progressive since October 2019  -complete heart echo ordered -CT of the chest abdomen pelvis ordered by ED provider with negative for dissection and positive for 1.8 cm circumscribed left renal mass, recommended further work-up this can be done outpatient -Troponin ordered by EDP was initially 4, increased to 5 -Checking TSH, and B12 -If echo is negative, patient may benefit from discharge home with Holter monitor to assess for abnormal heart rhythm  # Elevated LFTs, at baseline -Patient has history of hepatic steatosis, outpatient follow-up -Patient does not endorse abdominal pain at this time  # Hypertension-elevated, patient endorses taking her home antihypertensives -Amlodipine 5 mg daily, lisinopril 40 mg daily, metoprolol succinate 25 mg daily -Hydralazine 10 mg IV every 6 hours as needed for systolic blood pressure greater than 180  # Insulin-dependent diabetes mellitus-patient is wearing her insulin pump and it is on -She also has her continuous glucose monitoring in place -Insulin SSI with at bedtime coverage ordered  # Left renal mass approximately 1.8 cm-updated patient regarding the left renal mass, recommended outpatient follow-up with PCP  # GERD-pantoprazole 40 mg daily  # She has a right upper anterior chest wall Port-A-Cath due to difficulty with IV access  Chart reviewed.   DVT prophylaxis: Enoxaparin 40 mg subcutaneous every 24 hours Code Status: Full code Diet: Heart healthy/carb modified Family Communication: No Disposition Plan: Pending clinical course, echo, MRI of the brain Consults called: None at this time Admission status: Observation to progressive cardiac  Past Medical History:  Diagnosis Date  . Allergy    many many allergies  . Anemia    in past  . Anginal pain ([place]) 01/2018   cardiac workup clear  . Anxiety   . 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 Bipolar disorder ([place])   . Bladder mass    removed and had  tbt. mesh removed  . Blood transfusion without reported diagnosis 1999   received 20 units of blood after tearing esophagus from vomitting so severely after ERCP  . Breast mass    left side biopsy was negative  . Carpal tunnel syndrome    left hand  . Cervical cancer ([place]) 2002  . Chronic kidney disease    cyst on left kidney  . Chronic pancreatitis ([place])    prior to pancreas surgery  . Cirrhosis ([place])   . Clotting disorder ([place])    pt states that sometimes she has difficulty stopping to bleed and other times it is okay  . Depression   . Diabetes mellitus without complication (HCC)    has insulin pump  . Dyspnea   . Esophagitis   . GERD (gastroesophageal reflux disease)   . Headache    migraines...takes compazine for this  . Heart disease   . 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)    doctors think this was due to plavix and having a dental procedure which bled alot  . Hypertension   . Irritable bowel syndrome   . Kidney mass    just watching. left kidney mass.  . Liver disease    NASH. has had liver biopsies. negative except for NASH  . NASH (nonalcoholic steatohepatitis)   . NASH (nonalcoholic steatohepatitis)   . Pancreatitis   . PONV (postoperative nausea and vomiting)   . Stroke ([place]) 11/2017   had tia.  no longer needs plavix  . Thyroid disease    Past Surgical History:  Procedure Laterality Date  . ABDOMINAL HYSTERECTOMY    . APPENDECTOMY    . BLADDER SURGERY     mesh removed. revision which has caused everything to fall again  . BOWEL RESECTION     took a piece of bowel out after pancreatectomy caused problem with bowel.   Marland Kitchen BREAST BIOPSY Left 1989   benign  . CARDIAC CATHETERIZATION  3810,1751, 1998   no stents, fatty streaks  . CHOLECYSTECTOMY    . COLON SURGERY    . COLONOSCOPY WITH PROPOFOL N/A 10/08/2018   Procedure: COLONOSCOPY WITH PROPOFOL;  Surgeon: Lollie Sails, MD;  Location: [facility] ENDOSCOPY;  Service: Endoscopy;  Laterality: N/A;  . CYST REMOVAL HAND Left 1992    Ganglion cyst removed from Left Wrist  . CYSTECTOMY    . CYSTO WITH HYDRODISTENSION N/A 03/04/2019   Procedure: CYSTOSCOPY/HYDRODISTENSION;  Surgeon: Bjorn Loser, MD;  Location: [facility] ORS;  Service: Urology;  Laterality: N/A;  . DILATATION & CURETTAGE/HYSTEROSCOPY WITH MYOSURE    . DILATION AND CURETTAGE OF UTERUS    . ERCP    . ESOPHAGOGASTRODUODENOSCOPY    . HERNIA REPAIR    . KNEE ARTHROSCOPY Left 1992  . LEFT HEART CATH AND CORONARY ANGIOGRAPHY Left 02/06/2019   Procedure: LEFT HEART CATH AND CORONARY ANGIOGRAPHY;  Surgeon: Yolonda Kida, MD;  Location: [place] [facility];  Service: Cardiovascular;  Laterality: Left;  . LIVER BIOPSY    . PANCREAS SURGERY  2006   partial pancreatectomy after endoscope knicked a part of pancreas.   Marland Kitchen PORTA CATH INSERTION N/A 04/10/2017   Procedure: PORTA CATH INSERTION;  Surgeon: Algernon Huxley, MD;  Location: [place] [facility];  Service: Cardiovascular;  Laterality: N/A;  . PORTA CATH INSERTION N/A 02/21/2019   Procedure: PORTA CATH INSERTION;  Surgeon: Algernon Huxley, MD;  Location: [place] [facility];  Service: Cardiovascular;  Laterality: N/A;  . REPAIR OF ESOPHAGUS  2012   3 clamps placed in esophagus  . ROBOTIC ASSISTED LAPAROSCOPIC VENTRAL/INCISIONAL HERNIA REPAIR N/A 03/15/2018   Procedure: ROBOTIC ASSISTED LAPAROSCOPIC VENTRAL HERNIA REPAIR;  Surgeon: Jules Husbands, MD;  Location: [facility] ORS;  Service: General;  Laterality: N/A;  . SMALL BOWEL REPAIR    . TONSILLECTOMY     Social History:  reports that she has never smoked. She has never used smokeless tobacco. She reports current alcohol use. She reports that she does not use drugs.  Allergies  Allergen Reactions  . Ciprofloxacin     Rash, nausea vomitting  . Demerol [Meperidine] Hives and Other (See Comments)    Pt states that this medication causes cardiac arrest.    . Dilaudid [Hydromorphone Hcl] Nausea And Vomiting and Other (See Comments)    Pt states that this medication  causes cardiac arrest.    . Metoclopramide Hives    Breathing issues  . Morphine And Related Anaphylaxis and Other (See Comments)    Pt states that this medication causes cardiac arrest  . Morpholine Salicylate Nausea And Vomiting, Other (See Comments) and Rash    CHF  . Isosorbide Nitrate Other (See Comments)    Headache   . Sulfa Antibiotics Nausea And Vomiting  . Flagyl [Metronidazole]     Rash, heart racing, nausea vomitting  . Adhesive [Tape] Rash    Blisters skin Paper tape is okay  . Darvon [Propoxyphene] Nausea And Vomiting and Rash  . Flexeril [Cyclobenzaprine] Nausea  And Vomiting and Rash  . Levaquin [Levofloxacin In D5w] Nausea And Vomiting and Rash  . Naproxen Rash    Ibuprofen and advil are not a problem  . Soma [Carisoprodol] Nausea And Vomiting and Rash  . Zofran [Ondansetron Hcl] Nausea And Vomiting and Rash   Family History  Problem Relation Age of Onset  . Cirrhosis Mother   . Colon cancer Mother 7       TAH/BSO in ~82; deceased at 87  . Hypertension Mother   . Breast cancer Mother 37       unconfirmed  . Hypertension Father   . Prostate cancer Father 39       currently 56  . Other Father        liver disease  . Breast cancer Maternal Aunt        age at dx unk.; currently 6  . Cervical cancer Maternal Aunt   . Cancer Maternal Uncle        unk. type; currently 81s  . Stomach cancer Paternal Aunt 74       deceased at 98  . Breast cancer Maternal Grandmother 55       possibly bilateral in 63s; deceased 19  . Non-Hodgkin's lymphoma Daughter        unconfirmed; currently 62  . Cancer Maternal Aunt        hematologic malignancy; deceased 20   Family history: Family history reviewed and not pertinent  Prior to Admission medications   Medication Sig Start Date End Date Taking? Authorizing Provider  amLODipine (NORVASC) 5 MG tablet Take 5 mg by mouth daily.    [provider]  Calcium Carb-Cholecalciferol (CALCIUM 600/VITAMIN D3 PO) Take 1  tablet by mouth daily.    [provider]  Cholecalciferol (VITAMIN D-3) 5000 units TABS Take 5,000 Units by mouth daily.    [provider]  Continuous Blood Gluc Receiver (DEXCOM G6 RECEIVER) DEVI Use to monitor blood sugar. [facility] [id number] 08/30/19   [provider]  CONTOUR NEXT TEST test strip 1 each 4 (four) times daily. 07/05/19   [provider]  cyanocobalamin 1000 MCG tablet Take by mouth.    [provider]  EPINEPHrine 0.3 mg/0.3 mL IJ SOAJ injection SMARTSIG:1 Pre-Filled Pen Syringe IM PRN 05/31/19   [provider]  gabapentin (NEURONTIN) 300 MG capsule Take 600 mg by mouth 3 (three) times daily. 08/30/19   [provider]  insulin lispro (HUMALOG) 100 UNIT/ML injection USE UP TO 70 UNITS SUBCUTANEOUSLY PER DAY VIA INSULIN PUMP. Dx code e10.65 05/14/19   [provider]  LACTOBACILLUS PO Take by mouth.    [provider]  lipase/protease/amylase (CREON) 36000 UNITS CPEP capsule Take 144,000 Units by mouth 3 (three) times daily before meals.     [provider]  lisinopril (ZESTRIL) 40 MG tablet Take 40 mg by mouth daily. 09/03/18   [provider]  Magnesium (V-R MAGNESIUM) 250 MG TABS Take 250 mg by mouth daily.     [provider]  metoprolol succinate (TOPROL-XL) 25 MG 24 hr tablet Take 25 mg by mouth daily.    [provider]  MITIGARE 0.6 MG CAPS Take 1 capsule by mouth 2 (two) times daily. 04/09/19   [provider]  oxyCODONE-acetaminophen (PERCOCET) 5-325 MG tablet Take 1 tablet by mouth every 8 (eight) hours as needed. 09/09/19   Earleen Newport, MD  pantoprazole (PROTONIX) 40 MG tablet Take 1 tablet (40 mg total) by mouth daily. 07/26/19  07/25/20  Nena Polio, MD  potassium chloride (KLOR-CON) 10 MEQ tablet Take 10 mEq by mouth 2 (two) times daily. 09/20/19   [provider]  vitamin E 400 UNIT capsule Take 400 Units by mouth daily.     [provider]   Physical Exam: Vitals:   04/11/20 1630 04/11/20 1700 04/11/20 1730 04/11/20 1924  BP: (!) 176/89 (!) 171/108 (!) 172/91 (!) 204/111  Pulse: 70 72 62 69  Resp: 13 20 14 15   Temp:    98.5 F (36.9 C)  TempSrc:    Oral  SpO2: 98% 95% 97% 97%  Weight:      Height:       Constitutional: appears age-appropriate, NAD, calm, comfortable Eyes: PERRL, lids and conjunctivae normal ENMT: Mucous membranes are moist. Posterior pharynx clear of any exudate or lesions. Age-appropriate dentition. Hearing appropriate Neck: normal, supple, no masses, no thyromegaly Respiratory: clear to auscultation bilaterally, no wheezing, no crackles. Normal respiratory effort. No accessory muscle use.  Cardiovascular: Regular rate and rhythm, no murmurs / rubs / gallops. No extremity edema. 2+ pedal pulses. No carotid bruits.  Abdomen: no tenderness, no masses palpated, no hepatosplenomegaly. Bowel sounds positive.  Abdominal scarring present Musculoskeletal: no clubbing / cyanosis. No joint deformity upper and lower extremities. Good ROM, no contractures, no atrophy. Normal muscle tone.  Skin: no rashes, lesions, ulcers. No induration Neurologic: Sensation intact. Strength 5/5 in all 4.  Psychiatric: Normal judgment and insight. Alert and oriented x 3. Normal mood.   EKG: independently reviewed, showing normal sinus rhythm with rate of 70, QTc 425, LVH  Chest x-ray on Admission: I personally reviewed and I agree with radiologist reading as below.  DG Chest 2 View  Result Date: 04/11/2020 CLINICAL DATA:  Chest pain. EXAM: CHEST - 2 VIEW COMPARISON:  09/09/2019 FINDINGS: Right IJ Port-A-Cath unchanged. Lungs are adequately inflated without focal airspace process or effusion. Cardiomediastinal silhouette and remainder the exam is unchanged. IMPRESSION: No active cardiopulmonary disease. Electronically Signed   By: Marin Olp M.D.   On: 04/11/2020 12:18   CT Head Wo Contrast  Result  Date: 04/11/2020 CLINICAL DATA:  Dizziness, nonspecific. EXAM: CT HEAD WITHOUT CONTRAST TECHNIQUE: Contiguous axial images were obtained from the base of the skull through the vertex without intravenous contrast. COMPARISON:  Head CT November 22, 2017 and brain MRI November 23, 2017 FINDINGS: Brain: No evidence of acute large vascular territory infarction, hemorrhage, hydrocephalus, extra-axial collection or mass lesion/mass effect. Similar mild burden of chronic white matter ischemic change. Vascular: No hyperdense vessel or unexpected calcification. Skull: Hyperostosis frontalis interna, normal variant. Negative for fracture or focal lesion. Sinuses/Orbits: No acute finding. Other: Debris in the bilateral external auditory canals likely cerumen. IMPRESSION: 1. No acute intracranial pathology. 2. Similar mild burden of chronic white matter ischemic change. 3. Debris in the bilateral external cautery. Correlate for cerumen impaction. Electronically Signed   By: Dahlia Bailiff MD   On: 04/11/2020 14:54   MR BRAIN WO CONTRAST  Result Date: 04/11/2020 CLINICAL DATA:  Acute presentation with dizziness. Negative acute head CT. EXAM: MRI HEAD WITHOUT CONTRAST TECHNIQUE: Multiplanar, multiecho pulse sequences of the brain and surrounding structures were obtained without intravenous contrast. COMPARISON:  CT earlier same day.  MRI 11/23/2017. FINDINGS: Brain: Diffusion imaging does not show any acute or subacute infarction. Mild chronic small-vessel ischemic changes affect the pons. No focal cerebellar insult. Cerebral hemispheres show moderate small-vessel disease of the hemispheric white matter with foci of T2 and FLAIR signal  scattered within the deep and subcortical white matter. No cortical or large vessel territory infarction. Compared to the study of October 2019, findings are very minimally progressive. No mass, hemorrhage, hydrocephalus or extra-axial collection. Vascular: Major vessels at the base of the brain  show flow. Skull and upper cervical spine: Negative Sinuses/Orbits: Clear/normal Other: None IMPRESSION: No acute finding by MRI. Moderate chronic small-vessel ischemic changes of the cerebral hemispheric white matter, minimally progressive since October 2019. Electronically Signed   By: Nelson Chimes M.D.   On: 04/11/2020 19:19   CT Angio Chest/Abd/Pel for Dissection W and/or Wo Contrast  Result Date: 04/11/2020 CLINICAL DATA:  Abdominal and chest pain. Aortic dissection suspected. EXAM: CT ANGIOGRAPHY CHEST, ABDOMEN AND PELVIS TECHNIQUE: Non-contrast CT of the chest was initially obtained. Multidetector CT imaging through the chest, abdomen and pelvis was performed using the standard protocol during bolus administration of intravenous contrast. Multiplanar reconstructed images and MIPs were obtained and reviewed to evaluate the vascular anatomy. CONTRAST:  143m OMNIPAQUE IOHEXOL 350 MG/ML SOLN COMPARISON:  July 26, 2019 FINDINGS: CTA CHEST FINDINGS Cardiovascular: Preferential opacification of the thoracic aorta. No evidence of thoracic aortic aneurysm or dissection. Normal heart size. No pericardial effusion. Mediastinum/Nodes: No enlarged mediastinal, hilar, or axillary lymph nodes. Thyroid gland, trachea, and esophagus demonstrate no significant findings. Lungs/Pleura: Lungs are clear. No pleural effusion or pneumothorax. Musculoskeletal: No chest wall abnormality. No acute or significant osseous findings. Review of the MIP images confirms the above findings. CTA ABDOMEN AND PELVIS FINDINGS VASCULAR Aorta: Normal caliber aorta without aneurysm, dissection, vasculitis or significant stenosis. Celiac: Patent without evidence of aneurysm, dissection, vasculitis or significant stenosis. SMA: Patent without evidence of aneurysm, dissection, vasculitis or significant stenosis. Renals: Both renal arteries are patent without evidence of aneurysm, dissection, vasculitis, fibromuscular dysplasia or significant  stenosis. IMA: Patent without evidence of aneurysm, dissection, vasculitis or significant stenosis. Inflow: Patent without evidence of aneurysm, dissection, vasculitis or significant stenosis. Veins: No obvious venous abnormality within the limitations of this arterial phase study. Review of the MIP images confirms the above findings. NON-VASCULAR Hepatobiliary: Hepatic steatosis.  Post cholecystectomy. Pancreas: Most of the pancreas is surgically absent. No acute abnormalities. Spleen: Normal in size without focal abnormality. Adrenals/Urinary Tract: Adrenal glands are unremarkable. Kidneys are without renal calculi, or hydronephrosis. Left mid polar renal circumscribed hypoattenuated mass measures 1.8 cm, with attenuation slightly higher than for a simple cyst. Bladder is unremarkable. Stomach/Bowel: No acute findings stable sequela of distal small bowel resection with intact anastomotic site. Lymphatic: No evidence of lymphadenopathy. Reproductive: Status post hysterectomy. No adnexal masses. Other: No abdominal wall hernia or abnormality. No abdominopelvic ascites. Musculoskeletal: No acute or significant osseous findings. Review of the MIP images confirms the above findings. IMPRESSION: 1. No evidence of aortic dissection or aneurysm. 2. Hepatic steatosis. 3. 1.8 cm circumscribed left renal mass, with attenuation slightly higher than for a simple cyst. This may represent a hemorrhagic or proteinaceous cyst, however, further evaluation with renal ultrasound may be considered. 4. Stable sequela of distal small bowel resection with intact anastomotic site. Electronically Signed   By: DFidela SalisburyM.D.   On: 04/11/2020 15:09   Labs on Admission: I have personally reviewed following labs  CBC: Recent Labs  Lab 04/11/20 1152  WBC 7.0  HGB 14.0  HCT 40.5  MCV 82.5  PLT 1762  Basic Metabolic Panel: Recent Labs  Lab 04/11/20 1152 04/11/20 1450  NA 139  --   K 3.2*  --   CL 100  --  CO2 27   --   GLUCOSE 196*  --   BUN 10  --   CREATININE 0.76  --   CALCIUM 9.1  --   MG  --  1.8  PHOS  --  4.3   GFR: Estimated Creatinine Clearance: 78.6 mL/min (by C-G formula based on SCr of 0.76 mg/dL).  Liver Function Tests: Recent Labs  Lab 04/11/20 1152  AST 35  ALT 42  ALKPHOS 139*  BILITOT 4.2*  PROT 7.9  ALBUMIN 4.4   CBG: Recent Labs  Lab 04/11/20 2036  GLUCAP 246*   Thyroid Function Tests: Recent Labs    04/11/20 1450  TSH 2.748   Urine analysis:    Component Value Date/Time   COLORURINE YELLOW (A) 09/09/2019 1439   APPEARANCEUR HAZY (A) 09/09/2019 1439   APPEARANCEUR Cloudy (A) 08/12/2019 0851   LABSPEC 1.010 09/09/2019 1439   LABSPEC 1.018 01/31/2014 1455   PHURINE 5.0 09/09/2019 1439   GLUCOSEU NEGATIVE 09/09/2019 1439   GLUCOSEU >=500 01/31/2014 1455   HGBUR NEGATIVE 09/09/2019 1439   BILIRUBINUR NEGATIVE 09/09/2019 1439   BILIRUBINUR Negative 08/12/2019 0851   BILIRUBINUR Negative 01/31/2014 1455   KETONESUR 5 (A) 09/09/2019 1439   PROTEINUR NEGATIVE 09/09/2019 1439   NITRITE NEGATIVE 09/09/2019 1439   LEUKOCYTESUR TRACE (A) 09/09/2019 1439   LEUKOCYTESUR Negative 01/31/2014 1455   Edwena Mayorga N Raylon Lamson D.O. [facility]  If 7PM-7AM, please contact overnight-coverage provider If 7AM-7PM, please contact day coverage provider [email or web address]  04/11/2020, 9:13 PM

## 2020-04-11 NOTE — ED Triage Notes (Addendum)
Pt comes EMS from home with HTN and some CP. See first nurse note. Pt has port and would like her port accessed for blood. Willing to let lab come attempt. Pain to left shoulder, chest, palpitations. States that when her BP went up this morning, she got dizzy, sweaty, blacked out for a min. Pt has a significant heart history, including an esophgeal tear, stents, MI, etc.

## 2020-04-11 NOTE — ED Notes (Signed)
Patient transported to MRI 

## 2020-04-11 NOTE — ED Provider Notes (Signed)
[facility] Emergency Department Provider Note  ____________________________________________   Event Date/Time   First MD Initiated Contact with Patient 04/11/20 1213     (approximate)  I have reviewed the triage vital signs and the nursing notes.   HISTORY  Chief Complaint Hypertension    HPI Amber Christensen is a 55 y.o. female with cirrhosis who comes in with hypertension.  Patient states that she is had no changes to her blood pressure medicine but over the past few days she has noticed her blood pressure going up higher.  She states today she started having left shoulder pain chest discomfort and palpitations.  She was going to try to drive to the ER when she states that her vision went black and next thing she new her neighbor was knocking on her car door.  She is unsure if she had LOC but doesn't remember putting head on steering wheel which is how neighbor found her..  She states that she had a friend then called EMS.  Patient states that she also has had left arm weakness but has been going on for 3 weeks.  She reports having a tingling sensation in her left leg.  She still states that the left leg is little bit more tingly than the right.  However it is more of like pain when she tries to lift up her left leg then weakness.  She is feels like a muscles being pinched          Past Medical History:  Diagnosis Date  . Allergy    many many allergies  . Anemia    in past  . Anginal pain ([place]) 01/2018   cardiac workup clear  . Anxiety   . 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[place])    pt unaware of this diagnosis  . Bipolar disorder ([place])   . Bladder mass    removed and had tbt. mesh removed  . Blood transfusion without reported diagnosis 1999   received 20 units of blood after tearing esophagus from vomitting so severely after ERCP  . Breast mass    left side biopsy was negative  . Carpal tunnel syndrome    left hand  . Cervical cancer ([place]) 2002  .  Chronic kidney disease    cyst on left kidney  . Chronic pancreatitis ([place])    prior to pancreas surgery  . Cirrhosis ([place])   . Clotting disorder ([place])    pt states that sometimes she has difficulty stopping to bleed and other times it is okay  . Depression   . Diabetes mellitus without complication (HCC)    has insulin pump  . Dyspnea   . Esophagitis   . GERD (gastroesophageal reflux disease)   . Headache    migraines...takes compazine for this  . Heart disease   . 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 Hypertension   . Irritable bowel syndrome   . Kidney mass    just watching. left kidney mass.  . Liver disease    NASH. has had liver biopsies. negative except for NASH  . NASH (nonalcoholic steatohepatitis)   . NASH (nonalcoholic steatohepatitis)   . Pancreatitis   . PONV (postoperative nausea and vomiting)   . Stroke ([place]) 11/2017   had tia. no longer needs plavix  . Thyroid disease     Patient Active Problem List   Diagnosis Date Noted  . Port-A-Cath  in place 10/08/2019  . Abdominal aortic atherosclerosis ([place]) 10/02/2019  . Breast mass 10/02/2019  . Hives 10/02/2019  . Hypoglycemia 10/02/2019  . Irritable bowel syndrome 10/02/2019  . Bladder mass 10/02/2019  . Left sided abdominal pain of unknown cause 10/02/2019  . Migraine 10/02/2019  . NASH (nonalcoholic steatohepatitis) 10/02/2019  . Acute diarrhea 05/10/2019  . Hyperglycemia 09/23/2018  . S/P ventral herniorrhaphy 03/15/2018  . Ventral hernia without obstruction or gangrene   . Leg cramps 03/08/2018  . History of TIA (transient ischemic attack) 12/14/2017  . Vitamin B 12 deficiency 12/14/2017  . Atypical squamous cells of undetermined significance (ASCUS) on Papanicolaou smear of cervix 08/29/2017  . Chronic fatigue 06/06/2017  . Early satiety 06/06/2017  . Hypocalcemia 06/06/2017  . Hypertension 03/21/2017  . Diabetes mellitus without  complication ([place]) 98/92/1194  . Pancreatitis 03/21/2017  . Poor venous access 03/21/2017  . Steatorrhea 03/10/2017  . Frontal headache 12/08/2016  . Poorly-controlled hypertension 11/08/2016  . Encounter for long-term (current) use of high-risk medication 09/01/2016  . Low blood pressure reading 09/01/2016  . Vasculitis ([place]) 08/03/2016  . Hypokalemia 06/10/2016  . Acute neck pain 05/16/2016  . Acute maculopapular rash 04/21/2016  . Maculopapular rash, generalized 04/20/2016  . Vaginal bleeding, abnormal 02/18/2016  . Unstable angina ([place]) 09/28/2015  . Hypomagnesemia 03/03/2015  . Long-term insulin use ([place]) 04/20/2014    Past Surgical History:  Procedure Laterality Date  . ABDOMINAL HYSTERECTOMY    . APPENDECTOMY    . BLADDER SURGERY     mesh removed. revision which has caused everything to fall again  . BOWEL RESECTION     took a piece of bowel out after pancreatectomy caused problem with bowel.   Marland Kitchen BREAST BIOPSY Left 1989   benign  . CARDIAC CATHETERIZATION  [id number], 1998   no stents, fatty streaks  . CHOLECYSTECTOMY    . COLON SURGERY    . COLONOSCOPY WITH PROPOFOL N/A 10/08/2018   Procedure: COLONOSCOPY WITH PROPOFOL;  Surgeon: Lollie Sails, MD;  Location: [facility] ENDOSCOPY;  Service: Endoscopy;  Laterality: N/A;  . CYST REMOVAL HAND Left 1992   Ganglion cyst removed from Left Wrist  . CYSTECTOMY    . CYSTO WITH HYDRODISTENSION N/A 03/04/2019   Procedure: CYSTOSCOPY/HYDRODISTENSION;  Surgeon: Bjorn Loser, MD;  Location: [facility] ORS;  Service: Urology;  Laterality: N/A;  . DILATATION & CURETTAGE/HYSTEROSCOPY WITH MYOSURE    . DILATION AND CURETTAGE OF UTERUS    . ERCP    . ESOPHAGOGASTRODUODENOSCOPY    . HERNIA REPAIR    . KNEE ARTHROSCOPY Left 1992  . LEFT HEART CATH AND CORONARY ANGIOGRAPHY Left 02/06/2019   Procedure: LEFT HEART CATH AND CORONARY ANGIOGRAPHY;  Surgeon: Yolonda Kida, MD;  Location: [place] CV LAB;  Service: Cardiovascular;   Laterality: Left;  . LIVER BIOPSY    . PANCREAS SURGERY  2006   partial pancreatectomy after endoscope knicked a part of pancreas.   Marland Kitchen PORTA CATH INSERTION N/A 04/10/2017   Procedure: PORTA CATH INSERTION;  Surgeon: Algernon Huxley, MD;  Location: [place] CV LAB;  Service: Cardiovascular;  Laterality: N/A;  . PORTA CATH INSERTION N/A 02/21/2019   Procedure: PORTA CATH INSERTION;  Surgeon: Algernon Huxley, MD;  Location: [place] CV LAB;  Service: Cardiovascular;  Laterality: N/A;  . REPAIR OF ESOPHAGUS  2012   3 clamps placed in esophagus  . ROBOTIC ASSISTED LAPAROSCOPIC VENTRAL/INCISIONAL HERNIA REPAIR N/A 03/15/2018   Procedure: ROBOTIC ASSISTED LAPAROSCOPIC VENTRAL HERNIA REPAIR;  Surgeon:  Pabon, Marjory Lies, MD;  Location: [facility] ORS;  Service: General;  Laterality: N/A;  . SMALL BOWEL REPAIR    . TONSILLECTOMY      Prior to Admission medications   Medication Sig Start Date End Date Taking? Authorizing Provider  amLODipine (NORVASC) 5 MG tablet Take 5 mg by mouth daily.    [provider]  Calcium Carb-Cholecalciferol (CALCIUM 600/VITAMIN D3 PO) Take 1 tablet by mouth daily.    [provider]  Cholecalciferol (VITAMIN D-3) 5000 units TABS Take 5,000 Units by mouth daily.    [provider]  Continuous Blood Gluc Receiver (DEXCOM G6 RECEIVER) DEVI Use to monitor blood sugar. [facility] [id number] 08/30/19   [provider]  CONTOUR NEXT TEST test strip 1 each 4 (four) times daily. 07/05/19   [provider]  cyanocobalamin 1000 MCG tablet Take by mouth.    [provider]  EPINEPHrine 0.3 mg/0.3 mL IJ SOAJ injection SMARTSIG:1 Pre-Filled Pen Syringe IM PRN 05/31/19   [provider]  gabapentin (NEURONTIN) 300 MG capsule Take 600 mg by mouth 3 (three) times daily. 08/30/19   [provider]  insulin lispro (HUMALOG) 100 UNIT/ML injection USE UP TO 70 UNITS SUBCUTANEOUSLY PER DAY VIA INSULIN PUMP. Dx code e10.65 05/14/19    [provider]  LACTOBACILLUS PO Take by mouth.    [provider]  lipase/protease/amylase (CREON) 36000 UNITS CPEP capsule Take 144,000 Units by mouth 3 (three) times daily before meals.     [provider]  lisinopril (ZESTRIL) 40 MG tablet Take 40 mg by mouth daily. 09/03/18   [provider]  Magnesium (V-R MAGNESIUM) 250 MG TABS Take 250 mg by mouth daily.     [provider]  metoprolol succinate (TOPROL-XL) 25 MG 24 hr tablet Take 25 mg by mouth daily.    [provider]  MITIGARE 0.6 MG CAPS Take 1 capsule by mouth 2 (two) times daily. 04/09/19   [provider]  oxyCODONE-acetaminophen (PERCOCET) 5-325 MG tablet Take 1 tablet by mouth every 8 (eight) hours as needed. 09/09/19   Earleen Newport, MD  pantoprazole (PROTONIX) 40 MG tablet Take 1 tablet (40 mg total) by mouth daily. 07/26/19 07/25/20  Nena Polio, MD  potassium chloride (KLOR-CON) 10 MEQ tablet Take 10 mEq by mouth 2 (two) times daily. 09/20/19   [provider]  vitamin E 400 UNIT capsule Take 400 Units by mouth daily.    [provider]    Allergies Ciprofloxacin, Demerol [meperidine], Dilaudid [hydromorphone hcl], Metoclopramide, Morphine and related, Morpholine salicylate, Isosorbide nitrate, Sulfa antibiotics, Flagyl [metronidazole], Adhesive [tape], Darvon [propoxyphene], Flexeril [cyclobenzaprine], Levaquin [levofloxacin in d5w], Naproxen, Soma [carisoprodol], and Zofran [ondansetron hcl]  Family History  Problem Relation Age of Onset  . Cirrhosis Mother   . Colon cancer Mother 25       TAH/BSO in ~14; deceased at 21  . Hypertension Mother   . Breast cancer Mother 67       unconfirmed  . Hypertension Father   . Prostate cancer Father 62       currently 29  . Other Father        liver disease  . Breast cancer Maternal Aunt        age at dx unk.; currently 20  . Cervical cancer Maternal Aunt   . Cancer Maternal Uncle         unk. type; currently 9s  . Stomach cancer Paternal Aunt 67  deceased at 74  . Breast cancer Maternal Grandmother 54       possibly bilateral in 67s; deceased 68  . Non-Hodgkin's lymphoma Daughter        unconfirmed; currently 55  . Cancer Maternal Aunt        hematologic malignancy; deceased 35    Social History Social History   Tobacco Use  . Smoking status: Never Smoker  . Smokeless tobacco: Never Used  Vaping Use  . Vaping Use: Never used  Substance Use Topics  . Alcohol use: Yes    Comment: rarely  . Drug use: No      Review of Systems Constitutional: No fever/chills, possible LOC Eyes: No visual changes. ENT: No sore throat. Cardiovascular: + chest pain Respiratory: + SOB Gastrointestinal: No abdominal pain.  No nausea, no vomiting.  No diarrhea.  No constipation. Genitourinary: Negative for dysuria. Musculoskeletal: Negative for back pain. + leg pain Skin: Negative for rash. Neurological: Negative for headaches, + left arm weakness/leg pain All other ROS negative ____________________________________________   PHYSICAL EXAM:  VITAL SIGNS: ED Triage Vitals  Enc Vitals Group     BP 04/11/20 1149 (!) 212/120     Pulse Rate 04/11/20 1149 80     Resp 04/11/20 1149 18     Temp 04/11/20 1149 98.6 F (37 C)     Temp Source 04/11/20 1149 Oral     SpO2 04/11/20 1149 97 %     Weight 04/11/20 1144 160 lb (72.6 kg)     Height 04/11/20 1144 5' 4"  (1.626 m)     Head Circumference --      Peak Flow --      Pain Score 04/11/20 1144 6     Pain Loc --      Pain Edu? --      Excl. in [place]? --     Constitutional: Alert and oriented. Well appearing and in no acute distress. Eyes: Conjunctivae are normal. EOMI. Head: Atraumatic. Nose: No congestion/rhinnorhea. Mouth/Throat: Mucous membranes are moist.   Neck: No stridor. Trachea Midline. FROM Cardiovascular: Normal rate, regular rhythm. Grossly normal heart sounds.  Good peripheral circulation. Respiratory:  Normal respiratory effort.  No retractions. Lungs CTAB. Gastrointestinal: Soft and nontender. No distention. No abdominal bruits.  Musculoskeletal: unable to lift left leg up as high as right one due to pain. No swelling noted Neurologic:  Normal speech and language.slightly weaker grip on the left hand. Left leg unable to lift as high due to pain. Reports sensation changes on the left leg..  Skin:  Skin is warm, dry and intact. No rash noted. Psychiatric: Mood and affect are normal. Speech and behavior are normal. GU: Deferred   ____________________________________________   LABS (all labs ordered are listed, but only abnormal results are displayed)  Labs Reviewed  BASIC METABOLIC PANEL - Abnormal; Notable for the following components:      Result Value   Potassium 3.2 (*)    Glucose, Bld 196 (*)    All other components within normal limits  HEPATIC FUNCTION PANEL - Abnormal; Notable for the following components:   Alkaline Phosphatase 139 (*)    Total Bilirubin 4.2 (*)    Bilirubin, Direct 0.3 (*)    Indirect Bilirubin 3.9 (*)    All other components within normal limits  SARS CORONAVIRUS 2 (TAT 6-24 HRS)  CBC  METHYLMALONIC ACID, SERUM  TSH  BASIC METABOLIC PANEL  CBC  HIV ANTIBODY (ROUTINE TESTING W REFLEX)  PHOSPHORUS  MAGNESIUM  POC  URINE PREG, ED  TROPONIN I (HIGH SENSITIVITY)  TROPONIN I (HIGH SENSITIVITY)   ____________________________________________   ED ECG REPORT I, Vanessa , the attending physician, personally viewed and interpreted this ECG.  Normal sinus no st elevation no twi normal intervals  ____________________________________________  RADIOLOGY Robert Bellow, personally viewed and evaluated these images (plain radiographs) as part of my medical decision making, as well as reviewing the written report by the radiologist.  ED MD interpretation:  No widen mediastinum  Official radiology report(s): DG Chest 2 View  Result Date:  04/11/2020 CLINICAL DATA:  Chest pain. EXAM: CHEST - 2 VIEW COMPARISON:  09/09/2019 FINDINGS: Right IJ Port-A-Cath unchanged. Lungs are adequately inflated without focal airspace process or effusion. Cardiomediastinal silhouette and remainder the exam is unchanged. IMPRESSION: No active cardiopulmonary disease. Electronically Signed   By: Marin Olp M.D.   On: 04/11/2020 12:18   CT Head Wo Contrast  Result Date: 04/11/2020 CLINICAL DATA:  Dizziness, nonspecific. EXAM: CT HEAD WITHOUT CONTRAST TECHNIQUE: Contiguous axial images were obtained from the base of the skull through the vertex without intravenous contrast. COMPARISON:  Head CT November 22, 2017 and brain MRI November 23, 2017 FINDINGS: Brain: No evidence of acute large vascular territory infarction, hemorrhage, hydrocephalus, extra-axial collection or mass lesion/mass effect. Similar mild burden of chronic white matter ischemic change. Vascular: No hyperdense vessel or unexpected calcification. Skull: Hyperostosis frontalis interna, normal variant. Negative for fracture or focal lesion. Sinuses/Orbits: No acute finding. Other: Debris in the bilateral external auditory canals likely cerumen. IMPRESSION: 1. No acute intracranial pathology. 2. Similar mild burden of chronic white matter ischemic change. 3. Debris in the bilateral external cautery. Correlate for cerumen impaction. Electronically Signed   By: Dahlia Bailiff MD   On: 04/11/2020 14:54   CT Angio Chest/Abd/Pel for Dissection W and/or Wo Contrast  Result Date: 04/11/2020 CLINICAL DATA:  Abdominal and chest pain. Aortic dissection suspected. EXAM: CT ANGIOGRAPHY CHEST, ABDOMEN AND PELVIS TECHNIQUE: Non-contrast CT of the chest was initially obtained. Multidetector CT imaging through the chest, abdomen and pelvis was performed using the standard protocol during bolus administration of intravenous contrast. Multiplanar reconstructed images and MIPs were obtained and reviewed to evaluate the  vascular anatomy. CONTRAST:  114m OMNIPAQUE IOHEXOL 350 MG/ML SOLN COMPARISON:  July 26, 2019 FINDINGS: CTA CHEST FINDINGS Cardiovascular: Preferential opacification of the thoracic aorta. No evidence of thoracic aortic aneurysm or dissection. Normal heart size. No pericardial effusion. Mediastinum/Nodes: No enlarged mediastinal, hilar, or axillary lymph nodes. Thyroid gland, trachea, and esophagus demonstrate no significant findings. Lungs/Pleura: Lungs are clear. No pleural effusion or pneumothorax. Musculoskeletal: No chest wall abnormality. No acute or significant osseous findings. Review of the MIP images confirms the above findings. CTA ABDOMEN AND PELVIS FINDINGS VASCULAR Aorta: Normal caliber aorta without aneurysm, dissection, vasculitis or significant stenosis. Celiac: Patent without evidence of aneurysm, dissection, vasculitis or significant stenosis. SMA: Patent without evidence of aneurysm, dissection, vasculitis or significant stenosis. Renals: Both renal arteries are patent without evidence of aneurysm, dissection, vasculitis, fibromuscular dysplasia or significant stenosis. IMA: Patent without evidence of aneurysm, dissection, vasculitis or significant stenosis. Inflow: Patent without evidence of aneurysm, dissection, vasculitis or significant stenosis. Veins: No obvious venous abnormality within the limitations of this arterial phase study. Review of the MIP images confirms the above findings. NON-VASCULAR Hepatobiliary: Hepatic steatosis.  Post cholecystectomy. Pancreas: Most of the pancreas is surgically absent. No acute abnormalities. Spleen: Normal in size without focal abnormality. Adrenals/Urinary Tract: Adrenal glands are unremarkable. Kidneys are  without renal calculi, or hydronephrosis. Left mid polar renal circumscribed hypoattenuated mass measures 1.8 cm, with attenuation slightly higher than for a simple cyst. Bladder is unremarkable. Stomach/Bowel: No acute findings stable sequela of  distal small bowel resection with intact anastomotic site. Lymphatic: No evidence of lymphadenopathy. Reproductive: Status post hysterectomy. No adnexal masses. Other: No abdominal wall hernia or abnormality. No abdominopelvic ascites. Musculoskeletal: No acute or significant osseous findings. Review of the MIP images confirms the above findings. IMPRESSION: 1. No evidence of aortic dissection or aneurysm. 2. Hepatic steatosis. 3. 1.8 cm circumscribed left renal mass, with attenuation slightly higher than for a simple cyst. This may represent a hemorrhagic or proteinaceous cyst, however, further evaluation with renal ultrasound may be considered. 4. Stable sequela of distal small bowel resection with intact anastomotic site. Electronically Signed   By: Fidela Salisbury M.D.   On: 04/11/2020 15:09    ____________________________________________   PROCEDURES  Procedure(s) performed (including Critical Care):  .1-3 Lead EKG Interpretation Performed by: Vanessa [place], MD Authorized by: Vanessa [place], MD     Interpretation: normal     ECG rate:  70s   ECG rate assessment: normal     Rhythm: sinus rhythm     Ectopy: none     Conduction: normal       ____________________________________________   INITIAL IMPRESSION / ASSESSMENT AND PLAN / ED COURSE  Amber Christensen was evaluated in Emergency Department on 04/11/2020 for the symptoms described in the history of present illness. She was evaluated in the context of the global COVID-19 pandemic, which necessitated consideration that the patient might be at risk for infection with the SARS-CoV-2 virus that causes COVID-19. Institutional protocols and algorithms that pertain to the evaluation of patients at risk for COVID-19 are in a state of rapid change based on information released by regulatory bodies including the CDC and federal and state organizations. These policies and algorithms were followed during the patient's care in the ED.    Pt  HTN w/ multiple symptoms. Will get CT dissection to rule out dissection. CT head to rule out ICH/stroke given left sided symptoms. EKG/trop for ACS.  Labs to evaluate for electrolyte abnormality/AKI.  Keep pt on monitor for her possible syncope. Give oxycodone for pain  Labs re-assuring  CT scan negative- cyst on kidney pt is aware of has had this for a while  Given HTN and concern for syncope with some mild left sided deficits for 3 weeks will d/w hospital for admission for further workup.       ____________________________________________   FINAL CLINICAL IMPRESSION(S) / ED DIAGNOSES   Final diagnoses:  Primary hypertension  Syncope, unspecified syncope type  Left hand weakness      MEDICATIONS GIVEN DURING THIS VISIT:  Medications  citric acid-potassium citrate (POLYCITRA) 10 mEq/5 ml solution 30 mEq (has no administration in time range)  oxyCODONE-acetaminophen (PERCOCET/ROXICET) 5-325 MG per tablet 1 tablet (has no administration in time range)  amLODipine (NORVASC) tablet 5 mg (has no administration in time range)  lisinopril (ZESTRIL) tablet 40 mg (has no administration in time range)  metoprolol succinate (TOPROL-XL) 24 hr tablet 25 mg (has no administration in time range)  lipase/protease/amylase (CREON) capsule 144,000 Units (has no administration in time range)  pantoprazole (PROTONIX) EC tablet 40 mg (has no administration in time range)  vitamin B-12 (CYANOCOBALAMIN) tablet 1,000 mcg (has no administration in time range)  gabapentin (NEURONTIN) capsule 600 mg (has no administration in time range)  vitamin  E capsule 400 Units (has no administration in time range)  Vitamin D-3 TABS 5,000 Units (has no administration in time range)  acetaminophen (TYLENOL) tablet 325 mg (has no administration in time range)    Or  acetaminophen (TYLENOL) suppository 325 mg (has no administration in time range)  enoxaparin (LOVENOX) injection 40 mg (has no administration in time  range)  oxyCODONE-acetaminophen (PERCOCET/ROXICET) 5-325 MG per tablet 1 tablet (1 tablet Oral Given 04/11/20 1354)  iohexol (OMNIPAQUE) 350 MG/ML injection 100 mL (100 mLs Intravenous Contrast Given 04/11/20 1426)     ED Discharge Orders    None       Note:  This document was prepared using Dragon voice recognition software and may include unintentional dictation errors.   Vanessa Ector, MD 04/11/20 [id number]

## 2020-04-11 NOTE — ED Triage Notes (Signed)
First RN note: Pt to ED via [facility] with c/o HTN, per EMS pt also c/o CP that she describes as pressure that radiates to her L shoulder and neck. Per EMS pt with hx of liver disease, EMS reports some jaundice visible in the eyes.    218/118 CBG 232 100% RA 70HR

## 2020-04-12 ENCOUNTER — Observation Stay
Admit: 2020-04-12 | Discharge: 2020-04-12 | Disposition: A | Discharge status: Home / Self Care | Payer: Medicare HMO | Attending: Internal Medicine | Admitting: Internal Medicine

## 2020-04-12 ENCOUNTER — Observation Stay: Payer: Medicare HMO

## 2020-04-12 DIAGNOSIS — I160 Hypertensive urgency: Secondary | ICD-10-CM | POA: Diagnosis not present

## 2020-04-12 DIAGNOSIS — N281 Cyst of kidney, acquired: Secondary | ICD-10-CM | POA: Diagnosis not present

## 2020-04-12 DIAGNOSIS — R55 Syncope and collapse: Secondary | ICD-10-CM

## 2020-04-12 LAB — ECHOCARDIOGRAM COMPLETE
AR max vel: 2.24 cm2
AV Peak grad: 4.8 mmHg
Ao pk vel: 1.1 m/s
Area-P 1/2: 3.93 cm2
Height: 64 in
S' Lateral: 2.5 cm
Weight: 2656 oz

## 2020-04-12 LAB — CBC
HCT: 36.6 % (ref 36.0–46.0)
Hemoglobin: 12.9 g/dL (ref 12.0–15.0)
MCH: 29.1 pg (ref 26.0–34.0)
MCHC: 35.2 g/dL (ref 30.0–36.0)
MCV: 82.4 fL (ref 80.0–100.0)
Platelets: 181 10*3/uL (ref 150–400)
RBC: 4.44 MIL/uL (ref 3.87–5.11)
RDW: 12.7 % (ref 11.5–15.5)
WBC: 6.5 10*3/uL (ref 4.0–10.5)
nRBC: 0 % (ref 0.0–0.2)

## 2020-04-12 LAB — BASIC METABOLIC PANEL
Anion gap: 10 (ref 5–15)
BUN: 10 mg/dL (ref 6–20)
CO2: 29 mmol/L (ref 22–32)
Calcium: 8.1 mg/dL — ABNORMAL LOW (ref 8.9–10.3)
Chloride: 98 mmol/L (ref 98–111)
Creatinine, Ser: 0.73 mg/dL (ref 0.44–1.00)
GFR, Estimated: >60 mL/min (ref >60)
Glucose, Bld: 183 mg/dL — ABNORMAL HIGH (ref 70–99)
Potassium: 3.3 mmol/L — ABNORMAL LOW (ref 3.5–5.1)
Sodium: 137 mmol/L (ref 135–145)

## 2020-04-12 LAB — HEMOGLOBIN A1C
Hgb A1c MFr Bld: 8 % — ABNORMAL HIGH (ref 4.8–5.6)
Mean Plasma Glucose: 182.9 mg/dL

## 2020-04-12 LAB — HIV ANTIBODY (ROUTINE TESTING W REFLEX): HIV Screen 4th Generation wRfx: NONREACTIVE

## 2020-04-12 LAB — GLUCOSE, CAPILLARY
Glucose-Capillary: 232 mg/dL — ABNORMAL HIGH (ref 70–99)
Glucose-Capillary: 301 mg/dL — ABNORMAL HIGH (ref 70–99)

## 2020-04-12 LAB — SARS CORONAVIRUS 2 (TAT 6-24 HRS): SARS Coronavirus 2: NEGATIVE

## 2020-04-12 MED ORDER — PROCHLORPERAZINE EDISYLATE 10 MG/2ML IJ SOLN
10.0000 mg | Freq: Four times a day (QID) | INTRAMUSCULAR | Status: DC | PRN
  Administered 2020-04-12: 10 mg via INTRAVENOUS
  Filled 2020-04-12 (×2): qty 2

## 2020-04-12 MED ORDER — HEPARIN SOD (PORK) LOCK FLUSH 100 UNIT/ML IV SOLN
500.0000 [IU] | Freq: Once | INTRAVENOUS | Status: AC
  Administered 2020-04-12: 500 [IU] via INTRAVENOUS
  Filled 2020-04-12: qty 5

## 2020-04-12 MED ORDER — POTASSIUM CHLORIDE CRYS ER 10 MEQ PO TBCR: 10.0000 meq | EXTENDED_RELEASE_TABLET | Freq: Two times a day (BID) | ORAL | 0 refills | Status: DC

## 2020-04-12 MED ORDER — POTASSIUM CHLORIDE 20 MEQ PO PACK
40.0000 meq | PACK | Freq: Once | ORAL | Status: AC
  Administered 2020-04-12: 40 meq via ORAL
  Filled 2020-04-12: qty 2

## 2020-04-12 MED ORDER — TRAMADOL HCL 50 MG PO TABS
50.0000 mg | ORAL_TABLET | Freq: Once | ORAL | Status: AC
  Administered 2020-04-12: 50 mg via ORAL
  Filled 2020-04-12: qty 1

## 2020-04-12 NOTE — Discharge Summary (Addendum)
Physician Discharge Summary  Patient ID: KAREL MOWERS MRN: [id number] DOB/AGE: 15-Feb-1965 55 y.o.  Admit date: 04/11/2020 Discharge date: 04/12/2020  Admission Diagnoses:  Discharge Diagnoses:  Principal Problem:   Syncope and collapse Active Problems:   Hypertension   Chronic fatigue   Early satiety   Long-term insulin use (HCC)   NASH (nonalcoholic steatohepatitis)   Port-A-Cath in place   Hypertensive urgency Type 2 diabetes with long-term insulin use. Hypokalemia. suplemented Renal cyst.  Discharged Condition: good  Hospital Course:   BRESHA HOSACK is a 55 y.o. female with medical history significant for  chronic diarrhea, hypertension, ulcerative colitis, baseline urinary incontinence, baseline occasionally stooling on her self, B12 deficiency, GERD, insulin-dependent diabetes mellitus on insulin pump and has a CGM, diabetic neuropathy, presented to the emergency department for chief concerns of passing out.  Upon arriving the hospital, patient blood pressure running high, otherwise patient has no complaints.  Telemetry did not show any arrhythmia, she does not have any elevation of troponin.  On my face examination, patient does not have any heart murmurs. Echocardiogram showed EF 60-65%, regional abnormality and diastolic dysfunction.  I  try to set up a Zio recorder monitoring for 7 days, I have asked social worker and nurse to coordinate, if not not able to set up on the weekend, can come back to the cardiologist office to set up.  Patient has been scheduled to see Dr. Clayborn Bigness in the near future. Currently, patient is symptom-free, she is medically stable to be discharged. She has a mild hypokalemia, will be supplement before discharge. She also was found to have a renal cyst, confirmed with ultrasound.  The cyst size has not increased since 2017.  Patient need to follow-up renal ultrasound in 6 months.  Consults: None  Significant Diagnostic Studies:  CT  ANGIOGRAPHY CHEST, ABDOMEN AND PELVIS  TECHNIQUE: Non-contrast CT of the chest was initially obtained.  Multidetector CT imaging through the chest, abdomen and pelvis was performed using the standard protocol during bolus administration of intravenous contrast. Multiplanar reconstructed images and MIPs were obtained and reviewed to evaluate the vascular anatomy.  CONTRAST:  153m OMNIPAQUE IOHEXOL 350 MG/ML SOLN  COMPARISON:  July 26, 2019  FINDINGS: CTA CHEST FINDINGS  Cardiovascular: Preferential opacification of the thoracic aorta. No evidence of thoracic aortic aneurysm or dissection. Normal heart size. No pericardial effusion.  Mediastinum/Nodes: No enlarged mediastinal, hilar, or axillary lymph nodes. Thyroid gland, trachea, and esophagus demonstrate no significant findings.  Lungs/Pleura: Lungs are clear. No pleural effusion or pneumothorax.  Musculoskeletal: No chest wall abnormality. No acute or significant osseous findings.  Review of the MIP images confirms the above findings.  CTA ABDOMEN AND PELVIS FINDINGS  VASCULAR  Aorta: Normal caliber aorta without aneurysm, dissection, vasculitis or significant stenosis.  Celiac: Patent without evidence of aneurysm, dissection, vasculitis or significant stenosis.  SMA: Patent without evidence of aneurysm, dissection, vasculitis or significant stenosis.  Renals: Both renal arteries are patent without evidence of aneurysm, dissection, vasculitis, fibromuscular dysplasia or significant stenosis.  IMA: Patent without evidence of aneurysm, dissection, vasculitis or significant stenosis.  Inflow: Patent without evidence of aneurysm, dissection, vasculitis or significant stenosis.  Veins: No obvious venous abnormality within the limitations of this arterial phase study.  Review of the MIP images confirms the above findings.  NON-VASCULAR  Hepatobiliary: Hepatic steatosis.  Post  cholecystectomy.  Pancreas: Most of the pancreas is surgically absent. No acute abnormalities.  Spleen: Normal in size without focal abnormality.  Adrenals/Urinary Tract: Adrenal  glands are unremarkable. Kidneys are without renal calculi, or hydronephrosis. Left mid polar renal circumscribed hypoattenuated mass measures 1.8 cm, with attenuation slightly higher than for a simple cyst. Bladder is unremarkable.  Stomach/Bowel: No acute findings stable sequela of distal small bowel resection with intact anastomotic site.  Lymphatic: No evidence of lymphadenopathy.  Reproductive: Status post hysterectomy. No adnexal masses.  Other: No abdominal wall hernia or abnormality. No abdominopelvic ascites.  Musculoskeletal: No acute or significant osseous findings.  Review of the MIP images confirms the above findings.  IMPRESSION: 1. No evidence of aortic dissection or aneurysm. 2. Hepatic steatosis. 3. 1.8 cm circumscribed left renal mass, with attenuation slightly higher than for a simple cyst. This may represent a hemorrhagic or proteinaceous cyst, however, further evaluation with renal ultrasound may be considered. 4. Stable sequela of distal small bowel resection with intact anastomotic site.   Electronically Signed   By: Fidela Salisbury M.D.   On: 04/11/2020 15:09  MRI HEAD WITHOUT CONTRAST  TECHNIQUE: Multiplanar, multiecho pulse sequences of the brain and surrounding structures were obtained without intravenous contrast.  COMPARISON:  CT earlier same day.  MRI 11/23/2017.  FINDINGS: Brain: Diffusion imaging does not show any acute or subacute infarction. Mild chronic small-vessel ischemic changes affect the pons. No focal cerebellar insult. Cerebral hemispheres show moderate small-vessel disease of the hemispheric white matter with foci of T2 and FLAIR signal scattered within the deep and subcortical white matter. No cortical or large vessel  territory infarction. Compared to the study of October 2019, findings are very minimally progressive. No mass, hemorrhage, hydrocephalus or extra-axial collection.  Vascular: Major vessels at the base of the brain show flow.  Skull and upper cervical spine: Negative  Sinuses/Orbits: Clear/normal  Other: None  IMPRESSION: No acute finding by MRI. Moderate chronic small-vessel ischemic changes of the cerebral hemispheric white matter, minimally progressive since October 2019.   Electronically Signed   By: Nelson Chimes M.D.   On: 04/11/2020 19:19  RENAL / URINARY TRACT ULTRASOUND COMPLETE  COMPARISON:  CT 04/11/2020 and 12/03/2013, 10/28/2015 as well as 06/01/2017  FINDINGS: Right Kidney:  Renal measurements: 10.1 x 5.6 x 4.7 cm = volume: 141 mL. Echogenicity within normal limits. No mass or hydronephrosis visualized.  Left Kidney:  Renal measurements: 11.9 x 4.4 x 4.8 cm = volume: 132 mL. Echogenicity within normal limits. No mass or hydronephrosis visualized. 1.7 cm minimally complicated cyst over the mid pole left kidney with mild dependent debris.  Bladder:  Appears normal for degree of bladder distention. Bilateral ureteral jets visualized.  Other:  None.  IMPRESSION: 1.  Normal size kidneys without hydronephrosis.  2. 1.7 cm minimally complicated left renal cyst. This is not significantly changed in size compared to CT 2017. Recommend follow-up ultrasound 6 months.   Electronically Signed   By: Marin Olp M.D.   On: 04/12/2020 09:08  Echo:  1. Left ventricular ejection fraction, by estimation, is 60 to 65%. The left ventricle has normal function. The left ventricle demonstrates regional wall motion abnormalities (see scoring diagram/findings for description). Left ventricular diastolic parameters are consistent with Grade I diastolic dysfunction (impaired relaxation). 2. Right ventricular systolic function is normal. The  right ventricular size is normal. 3. The mitral valve is normal in structure. No evidence of mitral valve regurgitation. 4. The aortic valve is normal in structure. Aortic valve regurgitation is not visualized.  Treatments: Heart monitoring  Discharge Exam: Blood pressure (!) 152/95, pulse 84, temperature 97.9 F (36.6 C), resp. rate  16, height 5' 4"  (1.626 m), weight 75.3 kg, SpO2 95 %. General appearance: alert and cooperative Resp: clear to auscultation bilaterally Cardio: regular rate and rhythm, S1, S2 normal, no murmur, click, rub or gallop GI: soft, non-tender; bowel sounds normal; no masses,  no organomegaly Extremities: extremities normal, atraumatic, no cyanosis or edema  Disposition: Discharge disposition: 01-Home or Self Care       Discharge Instructions    Diet - low sodium heart healthy   Complete by: As directed    Increase activity slowly   Complete by: As directed      Allergies as of 04/12/2020      Reactions   Ciprofloxacin    Rash, nausea vomitting   Demerol [meperidine] Hives, Other (See Comments)   Pt states that this medication causes cardiac arrest.     Dilaudid [hydromorphone Hcl] Nausea And Vomiting, Other (See Comments)   Pt states that this medication causes cardiac arrest.     Metoclopramide Hives   Breathing issues   Morphine And Related Anaphylaxis, Other (See Comments)   Pt states that this medication causes cardiac arrest   Morpholine Salicylate Nausea And Vomiting, Other (See Comments), Rash   CHF   Isosorbide Nitrate Other (See Comments)   Headache   Sulfa Antibiotics Nausea And Vomiting   Flagyl [metronidazole]    Rash, heart racing, nausea vomitting   Adhesive [tape] Rash   Blisters skin Paper tape is okay   Darvon [propoxyphene] Nausea And Vomiting, Rash   Flexeril [cyclobenzaprine] Nausea And Vomiting, Rash   Levaquin [levofloxacin In D5w] Nausea And Vomiting, Rash   Naproxen Rash   Ibuprofen and advil are not a problem    Soma [carisoprodol] Nausea And Vomiting, Rash   Zofran [ondansetron Hcl] Nausea And Vomiting, Rash      Medication List    TAKE these medications   amitriptyline 25 MG tablet Commonly known as: ELAVIL Take 25-50 mg by mouth at bedtime.   amLODipine 5 MG tablet Commonly known as: NORVASC Take 5 mg by mouth daily.   CALCIUM 600/VITAMIN D3 PO Take 1 tablet by mouth daily.   Contour Next Test test strip Generic drug: glucose blood 1 each 4 (four) times daily.   cyanocobalamin 1000 MCG tablet Take by mouth.   Dexcom G6 Receiver Devi Use to monitor blood sugar. Bent [phone]   Elmiron 100 MG capsule Generic drug: pentosan polysulfate Take 100 mg by mouth 3 (three) times daily.   EPINEPHrine 0.3 mg/0.3 mL Soaj injection Commonly known as: EPI-PEN SMARTSIG:1 Pre-Filled Pen Syringe IM PRN   estradiol 0.1 MG/GM vaginal cream Commonly known as: ESTRACE Place vaginally.   gabapentin 300 MG capsule Commonly known as: NEURONTIN Take 600 mg by mouth 3 (three) times daily.   insulin lispro 100 UNIT/ML injection Commonly known as: HUMALOG USE UP TO 70 UNITS SUBCUTANEOUSLY PER DAY VIA INSULIN PUMP. Dx code e10.65   LACTOBACILLUS PO Take by mouth.   lipase/protease/amylase 36000 UNITS Cpep capsule Commonly known as: CREON Take 144,000 Units by mouth 3 (three) times daily before meals.   lisinopril 40 MG tablet Commonly known as: ZESTRIL Take 40 mg by mouth daily.   Magnesium 250 MG Tabs Take 250 mg by mouth daily.   metoprolol succinate 25 MG 24 hr tablet Commonly known as: TOPROL-XL Take 25 mg by mouth daily.   Mitigare 0.6 MG Caps Generic drug: Colchicine Take 1 capsule by mouth 2 (two) times daily.   oxyCODONE-acetaminophen 5-325 MG tablet Commonly known as: Percocet  Take 1 tablet by mouth every 8 (eight) hours as needed.   pantoprazole 40 MG tablet Commonly known as: Protonix Take 1 tablet (40 mg total) by mouth daily.   potassium chloride 10 MEQ  tablet Commonly known as: KLOR-CON Take 10 mEq by mouth 2 (two) times daily.   Vitamin D-3 125 MCG (5000 UT) Tabs Take 5,000 Units by mouth daily.   vitamin E 180 MG (400 UNITS) capsule Take 400 Units by mouth daily.       Follow-up Information    [name], MD Follow up in 1 week(s).   Specialty: Internal Medicine Contact information: [place] [address] [phone]        Wellington Hampshire, MD Follow up in 2 week(s).   Specialty: Cardiology Contact information: [place] [address] [phone]               Signed: Sharen Hones 04/12/2020, 10:26 AM

## 2020-04-12 NOTE — Progress Notes (Signed)
*  PRELIMINARY RESULTS* Echocardiogram 2D Echocardiogram has been performed.  Amber Christensen 04/12/2020, 12:20 PM

## 2020-04-12 NOTE — TOC Transition Note (Signed)
Transition of Care [facility]) - CM/SW Discharge Note   Patient Details  Name: Amber Christensen MRN: [id number] Date of Birth: January 26, 1966  [facility] CM/SW Contact:  Harriet Masson, RN Phone Number:[phone] 04/12/2020, 11:11 AM   Clinical Narrative:    Spoke with pt today concerning the initial assessment. Pt states she has supportive spouse Elenore Rota) who will be transporting her home today. States she will be able to obtain all her medications from her local [place]. Able to ambulate two steps to get into her home. Pt will requested a virtual call from her provider's office if she is unable to obtain transportation.   Pt states she is awaiting an ECHO procedure and a monitoring device (Zio monitor).   TOC RN requested to obtain the Zio device however confirmed [employer] [id number]) this is not part of their DME and [employer] is unable to provide this device.  TOC RN unable to obtain this device as requested. RN informed Dr. Roosevelt Locks that pt would need to to obtain this device from her provider ([facility]) for ongoing readings. Pt also made aware.   No other needs presented.   Final next level of care: Home/Self Care Barriers to Discharge: No Barriers Identified   Patient Goals and CMS Choice        Discharge Placement                    Patient and family notified of of transfer: 04/12/20 (Spoke with pt)  Discharge Plan and Services In-house Referral: Clinical Social Work                                   Social Determinants of Health (SDOH) Interventions     Readmission Risk Interventions No flowsheet data found.

## 2020-04-16 LAB — METHYLMALONIC ACID, SERUM: Methylmalonic Acid, Quantitative: 572 nmol/L — ABNORMAL HIGH (ref 0–378)

## 2020-04-24 ENCOUNTER — Other Ambulatory Visit: Payer: Self-pay | Admitting: Student

## 2020-04-24 DIAGNOSIS — I208 Other forms of angina pectoris: Secondary | ICD-10-CM

## 2020-05-04 ENCOUNTER — Ambulatory Visit: Payer: Medicare Other

## 2020-05-19 ENCOUNTER — Other Ambulatory Visit: Payer: Self-pay

## 2020-05-19 ENCOUNTER — Encounter
Admission: RE | Admit: 2020-05-19 | Discharge: 2020-05-19 | Disposition: A | Discharge status: Home / Self Care | Payer: Medicare Other | Source: Ambulatory Visit | Attending: Student | Admitting: Student

## 2020-05-19 DIAGNOSIS — I208 Other forms of angina pectoris: Secondary | ICD-10-CM | POA: Diagnosis not present

## 2020-05-19 MED ORDER — TECHNETIUM TC 99M TETROFOSMIN IV KIT
10.0000 | PACK | Freq: Once | INTRAVENOUS | Status: AC | PRN
  Administered 2020-05-19: 10.022 via INTRAVENOUS

## 2020-05-19 MED ORDER — TECHNETIUM TC 99M TETROFOSMIN IV KIT
30.0000 | PACK | Freq: Once | INTRAVENOUS | Status: AC | PRN
  Administered 2020-05-19: 28.56 via INTRAVENOUS

## 2020-05-19 MED ORDER — REGADENOSON 0.4 MG/5ML IV SOLN
0.4000 mg | Freq: Once | INTRAVENOUS | Status: AC
  Administered 2020-05-19: 0.4 mg via INTRAVENOUS

## 2020-05-25 ENCOUNTER — Ambulatory Visit
Admission: RE | Admit: 2020-05-25 | Discharge: 2020-05-25 | Disposition: A | Discharge status: Home / Self Care | Payer: Medicare Other | Source: Ambulatory Visit | Attending: Gastroenterology | Admitting: Gastroenterology

## 2020-05-25 DIAGNOSIS — Z452 Encounter for adjustment and management of vascular access device: Secondary | ICD-10-CM | POA: Insufficient documentation

## 2020-05-25 MED ORDER — HEPARIN SOD (PORK) LOCK FLUSH 100 UNIT/ML IV SOLN
500.0000 [IU] | Freq: Once | INTRAVENOUS | Status: AC
  Administered 2020-05-25: 500 [IU] via INTRAVENOUS

## 2020-05-25 MED ORDER — HEPARIN SOD (PORK) LOCK FLUSH 100 UNIT/ML IV SOLN
INTRAVENOUS | Status: AC
  Filled 2020-05-25: qty 5

## 2020-05-27 LAB — NM MYOCAR MULTI W/SPECT W/WALL MOTION / EF
Estimated workload: 1 METS
Exercise duration (min): 1 min
Exercise duration (sec): 12 s
LV dias vol: 50 mL (ref 46–106)
LV sys vol: 16 mL
MPHR: 165 {beats}/min
Peak HR: 78 {beats}/min
Percent HR: 68 %
Rest HR: 78 {beats}/min
SDS: 1
SRS: 0
SSS: 1
TID: 1.04

## 2020-07-06 ENCOUNTER — Ambulatory Visit
Admission: RE | Admit: 2020-07-06 | Discharge: 2020-07-06 | Disposition: A | Discharge status: Home / Self Care | Payer: Medicare Other | Source: Ambulatory Visit | Attending: Gastroenterology | Admitting: Gastroenterology

## 2020-07-06 ENCOUNTER — Other Ambulatory Visit: Payer: Self-pay

## 2020-07-06 DIAGNOSIS — Z452 Encounter for adjustment and management of vascular access device: Secondary | ICD-10-CM | POA: Insufficient documentation

## 2020-07-06 MED ORDER — HEPARIN SOD (PORK) LOCK FLUSH 100 UNIT/ML IV SOLN: 500.0000 [IU] | INTRAVENOUS | Status: DC | PRN

## 2020-07-06 MED ORDER — HEPARIN SOD (PORK) LOCK FLUSH 100 UNIT/ML IV SOLN
INTRAVENOUS | Status: AC
  Filled 2020-07-06: qty 5

## 2020-07-09 ENCOUNTER — Other Ambulatory Visit: Payer: Self-pay | Admitting: Internal Medicine

## 2020-07-09 DIAGNOSIS — Z1231 Encounter for screening mammogram for malignant neoplasm of breast: Secondary | ICD-10-CM

## 2020-08-13 ENCOUNTER — Emergency Department: Payer: Medicare Other

## 2020-08-13 ENCOUNTER — Observation Stay: Payer: Medicare Other

## 2020-08-13 ENCOUNTER — Other Ambulatory Visit: Payer: Self-pay

## 2020-08-13 ENCOUNTER — Observation Stay
Admission: EM | Admit: 2020-08-13 | Discharge: 2020-08-15 | Disposition: A | Discharge status: Home / Self Care | Payer: Medicare Other | Attending: Family Medicine | Admitting: Family Medicine

## 2020-08-13 DIAGNOSIS — R29818 Other symptoms and signs involving the nervous system: Secondary | ICD-10-CM | POA: Insufficient documentation

## 2020-08-13 DIAGNOSIS — I10 Essential (primary) hypertension: Secondary | ICD-10-CM | POA: Diagnosis not present

## 2020-08-13 DIAGNOSIS — E876 Hypokalemia: Secondary | ICD-10-CM | POA: Diagnosis not present

## 2020-08-13 DIAGNOSIS — M6281 Muscle weakness (generalized): Secondary | ICD-10-CM | POA: Diagnosis not present

## 2020-08-13 DIAGNOSIS — Z794 Long term (current) use of insulin: Secondary | ICD-10-CM | POA: Diagnosis not present

## 2020-08-13 DIAGNOSIS — Z79899 Other long term (current) drug therapy: Secondary | ICD-10-CM | POA: Diagnosis not present

## 2020-08-13 DIAGNOSIS — E1122 Type 2 diabetes mellitus with diabetic chronic kidney disease: Secondary | ICD-10-CM | POA: Diagnosis not present

## 2020-08-13 DIAGNOSIS — G459 Transient cerebral ischemic attack, unspecified: Secondary | ICD-10-CM | POA: Diagnosis not present

## 2020-08-13 DIAGNOSIS — N189 Chronic kidney disease, unspecified: Secondary | ICD-10-CM | POA: Diagnosis not present

## 2020-08-13 DIAGNOSIS — Z20822 Contact with and (suspected) exposure to covid-19: Secondary | ICD-10-CM | POA: Insufficient documentation

## 2020-08-13 DIAGNOSIS — Z8541 Personal history of malignant neoplasm of cervix uteri: Secondary | ICD-10-CM | POA: Insufficient documentation

## 2020-08-13 DIAGNOSIS — F419 Anxiety disorder, unspecified: Secondary | ICD-10-CM

## 2020-08-13 DIAGNOSIS — R531 Weakness: Secondary | ICD-10-CM | POA: Diagnosis present

## 2020-08-13 LAB — BASIC METABOLIC PANEL
Anion gap: 10 (ref 5–15)
BUN: 12 mg/dL (ref 6–20)
CO2: 29 mmol/L (ref 22–32)
Calcium: 8.1 mg/dL — ABNORMAL LOW (ref 8.9–10.3)
Chloride: 99 mmol/L (ref 98–111)
Creatinine, Ser: 0.78 mg/dL (ref 0.44–1.00)
GFR, Estimated: >60 mL/min (ref >60)
Glucose, Bld: 209 mg/dL — ABNORMAL HIGH (ref 70–99)
Potassium: 3.2 mmol/L — ABNORMAL LOW (ref 3.5–5.1)
Sodium: 138 mmol/L (ref 135–145)

## 2020-08-13 LAB — URINALYSIS, COMPLETE (UACMP) WITH MICROSCOPIC
Bilirubin Urine: NEGATIVE
Glucose, UA: NEGATIVE mg/dL
Hgb urine dipstick: NEGATIVE
Ketones, ur: NEGATIVE mg/dL
Leukocytes,Ua: NEGATIVE
Nitrite: NEGATIVE
Protein, ur: NEGATIVE mg/dL
Specific Gravity, Urine: 1.028 (ref 1.005–1.030)
pH: 5 (ref 5.0–8.0)

## 2020-08-13 LAB — POC URINE PREG, ED: Preg Test, Ur: NEGATIVE

## 2020-08-13 LAB — RESP PANEL BY RT-PCR (FLU A&B, COVID) ARPGX2
Influenza A by PCR: NEGATIVE
Influenza B by PCR: NEGATIVE
SARS Coronavirus 2 by RT PCR: NEGATIVE

## 2020-08-13 LAB — CBC
HCT: 36.1 % (ref 36.0–46.0)
Hemoglobin: 13.2 g/dL (ref 12.0–15.0)
MCH: 30.4 pg (ref 26.0–34.0)
MCHC: 36.6 g/dL — ABNORMAL HIGH (ref 30.0–36.0)
MCV: 83.2 fL (ref 80.0–100.0)
Platelets: 219 10*3/uL (ref 150–400)
RBC: 4.34 MIL/uL (ref 3.87–5.11)
RDW: 12.5 % (ref 11.5–15.5)
WBC: 8.7 10*3/uL (ref 4.0–10.5)
nRBC: 0 % (ref 0.0–0.2)

## 2020-08-13 LAB — CBG MONITORING, ED: Glucose-Capillary: 182 mg/dL — ABNORMAL HIGH (ref 70–99)

## 2020-08-13 LAB — TROPONIN I (HIGH SENSITIVITY)
Troponin I (High Sensitivity): 3 ng/L (ref <18)
Troponin I (High Sensitivity): 4 ng/L (ref <18)

## 2020-08-13 LAB — D-DIMER, QUANTITATIVE: D-Dimer, Quant: <0.27 ug/mL-FEU (ref 0.00–0.50)

## 2020-08-13 LAB — GLUCOSE, CAPILLARY: Glucose-Capillary: 368 mg/dL — ABNORMAL HIGH (ref 70–99)

## 2020-08-13 MED ORDER — POTASSIUM CHLORIDE CRYS ER 20 MEQ PO TBCR
40.0000 meq | EXTENDED_RELEASE_TABLET | Freq: Once | ORAL | Status: AC
  Administered 2020-08-13: 40 meq via ORAL
  Filled 2020-08-13: qty 2

## 2020-08-13 MED ORDER — ACETAMINOPHEN 160 MG/5ML PO SOLN
650.0000 mg | ORAL | Status: DC | PRN
  Filled 2020-08-13: qty 20.3

## 2020-08-13 MED ORDER — STROKE: EARLY STAGES OF RECOVERY BOOK: Freq: Once | Status: AC

## 2020-08-13 MED ORDER — ACETAMINOPHEN 650 MG RE SUPP: 650.0000 mg | RECTAL | Status: DC | PRN

## 2020-08-13 MED ORDER — AMLODIPINE BESYLATE 5 MG PO TABS
5.0000 mg | ORAL_TABLET | Freq: Every day | ORAL | Status: DC
  Administered 2020-08-14 – 2020-08-15 (×2): 5 mg via ORAL
  Filled 2020-08-13 (×2): qty 1

## 2020-08-13 MED ORDER — VITAMIN D 25 MCG (1000 UNIT) PO TABS
5000.0000 [IU] | ORAL_TABLET | Freq: Every day | ORAL | Status: DC
  Administered 2020-08-14 – 2020-08-15 (×2): 5000 [IU] via ORAL
  Filled 2020-08-13 (×2): qty 5

## 2020-08-13 MED ORDER — PANTOPRAZOLE SODIUM 40 MG PO TBEC
40.0000 mg | DELAYED_RELEASE_TABLET | Freq: Every day | ORAL | Status: DC
  Administered 2020-08-14 – 2020-08-15 (×2): 40 mg via ORAL
  Filled 2020-08-13 (×2): qty 1

## 2020-08-13 MED ORDER — GABAPENTIN 300 MG PO CAPS
600.0000 mg | ORAL_CAPSULE | Freq: Three times a day (TID) | ORAL | Status: DC
  Administered 2020-08-13 – 2020-08-15 (×6): 600 mg via ORAL
  Filled 2020-08-13 (×6): qty 2

## 2020-08-13 MED ORDER — LABETALOL HCL 5 MG/ML IV SOLN: 5.0000 mg | INTRAVENOUS | Status: DC | PRN

## 2020-08-13 MED ORDER — PANCRELIPASE (LIP-PROT-AMYL) 36000-114000 UNITS PO CPEP
144000.0000 [IU] | ORAL_CAPSULE | Freq: Three times a day (TID) | ORAL | Status: DC
  Administered 2020-08-13 – 2020-08-15 (×6): 144000 [IU] via ORAL
  Filled 2020-08-13 (×7): qty 4

## 2020-08-13 MED ORDER — ACETAMINOPHEN 325 MG PO TABS
650.0000 mg | ORAL_TABLET | ORAL | Status: DC | PRN
  Administered 2020-08-14 (×2): 650 mg via ORAL
  Filled 2020-08-13 (×2): qty 2

## 2020-08-13 MED ORDER — LISINOPRIL 20 MG PO TABS
40.0000 mg | ORAL_TABLET | Freq: Every day | ORAL | Status: DC
  Administered 2020-08-14 – 2020-08-15 (×2): 40 mg via ORAL
  Filled 2020-08-13 (×2): qty 2

## 2020-08-13 MED ORDER — IOHEXOL 350 MG/ML SOLN
75.0000 mL | Freq: Once | INTRAVENOUS | Status: AC | PRN
  Administered 2020-08-13: 75 mL via INTRAVENOUS

## 2020-08-13 MED ORDER — METOPROLOL SUCCINATE ER 25 MG PO TB24
25.0000 mg | ORAL_TABLET | Freq: Every day | ORAL | Status: DC
  Administered 2020-08-14 – 2020-08-15 (×2): 25 mg via ORAL
  Filled 2020-08-13 (×2): qty 1

## 2020-08-13 MED ORDER — AMITRIPTYLINE HCL 50 MG PO TABS
25.0000 mg | ORAL_TABLET | Freq: Every day | ORAL | Status: DC
Start: 2020-08-13 — End: 2020-08-13

## 2020-08-13 MED ORDER — ENOXAPARIN SODIUM 40 MG/0.4ML IJ SOSY
40.0000 mg | PREFILLED_SYRINGE | INTRAMUSCULAR | Status: DC
  Administered 2020-08-13 – 2020-08-14 (×2): 40 mg via SUBCUTANEOUS
  Filled 2020-08-13 (×2): qty 0.4

## 2020-08-13 NOTE — ED Notes (Signed)
Provided phone for pt to talk to MRI screener.

## 2020-08-13 NOTE — ED Notes (Signed)
Dr Tobie Poet at bedside with pt.

## 2020-08-13 NOTE — ED Notes (Signed)
Pharmacy tech at bedside doing med req with pt.

## 2020-08-13 NOTE — ED Notes (Signed)
Admitting at bedside with patient. Patient requested blood sugar check. CBG 182 at this time. Applesauce and crackers provided.

## 2020-08-13 NOTE — ED Triage Notes (Signed)
Pt comes into the ED via EMS from home with c/o elevated b/p for the past week, having left sided weakness, "feels funny" for the past week.   162/102 [id number] 98%RA

## 2020-08-13 NOTE — ED Notes (Signed)
Patient transported to X-ray 

## 2020-08-13 NOTE — ED Notes (Addendum)
Pt to ED c/o L sided weakness and SOB. L sided weakness started 1 week ago and got worse 2d ago. States BP has been elevated on home monitor (around 170/110) and is taking BP meds as ordered. Dosage was increased 6d ago.   Pt states felt SOB in ambulance on way to ED and that HR was 117 in ambulance. Pt has implanted port R chest because pt had long complicated course of pancreatitis and lost easy peripheral  IV access.

## 2020-08-13 NOTE — ED Notes (Signed)
Pt walked to toilet, voided. Back in bed. Steady gait.

## 2020-08-13 NOTE — ED Notes (Signed)
Patient transported to MRI 

## 2020-08-13 NOTE — ED Notes (Signed)
Overdue meds are not yet verified by pharmacy.

## 2020-08-13 NOTE — ED Notes (Signed)
CT called to inform pt has power port. They will come examine pt's port.

## 2020-08-13 NOTE — ED Notes (Signed)
Patient returned from MRI.

## 2020-08-13 NOTE — ED Provider Notes (Signed)
[facility] Emergency Department Provider Note  ___________________________________________   Event Date/Time   First MD Initiated Contact with Patient 08/13/20 1349     (approximate)  I have reviewed the triage vital signs and the nursing notes.   HISTORY  Chief Complaint Weakness and Hypertension  HPI Amber Christensen is a 55 y.o. female with significant past medical history as below, who presents to the emergency department for multiple medical complaints.  First, patient reports she has had worsening hypertension over the last 1 week.  She is already on quadruple therapy for this from cardiology, but states that it has been continuing to worsen despite compliance with medications.  She also reports 1 episode 1 week ago of short-lived left-sided weakness with return of the same weakness at 8:30 AM today.  She reports history of possible stroke with no residual deficits.  She reports calling her cardiologist as well as neurologist this morning and them recommended further evaluation.  She denies any chest pain, but does endorse intermittent shortness of breath.  She denies any abdominal pain, nausea vomiting or diarrhea.  She also reports an associated left-sided headache.     Past Medical History:  Diagnosis Date   Allergy    many many allergies   Anemia    in past   Anginal pain ([place]) 01/2018   cardiac workup clear   Anxiety    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[place])    pt unaware of this diagnosis   Bipolar disorder ([place])    Bladder mass    removed and had tbt. mesh removed   Blood transfusion without reported diagnosis 1999   received 20 units of blood after tearing esophagus from vomitting so severely after ERCP   Breast mass    left side biopsy was negative   Carpal tunnel syndrome    left hand   Cervical cancer ([place]) 2002   Chronic kidney disease    cyst on left kidney   Chronic pancreatitis ([place])    prior to pancreas surgery   Cirrhosis  ([place])    Clotting disorder ([place])    pt states that sometimes she has difficulty stopping to bleed and other times it is okay   Depression    Diabetes mellitus without complication (HCC)    has insulin pump   Dyspnea    Esophagitis    GERD (gastroesophageal reflux disease)    Headache    migraines...takes compazine for this   Heart disease    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 left kidney mass.   Liver disease    NASH. has had liver biopsies. negative except for NASH   NASH (nonalcoholic steatohepatitis)    NASH (nonalcoholic steatohepatitis)    Pancreatitis    PONV (postoperative nausea and vomiting)    Stroke ([place]) 11/2017   had tia. no longer needs plavix   Thyroid disease     Patient Active Problem List   Diagnosis Date Noted   TIA (transient ischemic attack) 08/13/2020   Hypertensive urgency 04/11/2020   Syncope and collapse 04/11/2020   Port-A-Cath in place 10/08/2019   Abdominal aortic atherosclerosis ([place]) 10/02/2019   Breast mass 10/02/2019   Hives 10/02/2019   Hypoglycemia 10/02/2019   Irritable bowel syndrome 10/02/2019   Bladder mass 10/02/2019   Left sided abdominal  pain of unknown cause 10/02/2019   Migraine 10/02/2019   NASH (nonalcoholic steatohepatitis) 10/02/2019   Acute diarrhea 05/10/2019   Hyperglycemia 09/23/2018   S/P ventral herniorrhaphy 03/15/2018   Ventral hernia without obstruction or gangrene    Leg cramps 03/08/2018   History of TIA (transient ischemic attack) 12/14/2017   Vitamin B 12 deficiency 12/14/2017   Atypical squamous cells of undetermined significance (ASCUS) on Papanicolaou smear of cervix 08/29/2017   Chronic fatigue 06/06/2017   Early satiety 06/06/2017   Hypocalcemia 06/06/2017   Hypertension 03/21/2017   Diabetes mellitus without complication ([place]) 34/74/2595   Pancreatitis  03/21/2017   Poor venous access 03/21/2017   Steatorrhea 03/10/2017   Frontal headache 12/08/2016   Poorly-controlled hypertension 11/08/2016   Encounter for long-term (current) use of high-risk medication 09/01/2016   Low blood pressure reading 09/01/2016   Vasculitis ([place]) 08/03/2016   Hypokalemia 06/10/2016   Acute neck pain 05/16/2016   Acute maculopapular rash 04/21/2016   Maculopapular rash, generalized 04/20/2016   Vaginal bleeding, abnormal 02/18/2016   Unstable angina ([place]) 09/28/2015   Hypomagnesemia 03/03/2015   Long-term insulin use ([place]) 04/20/2014    Past Surgical History:  Procedure Laterality Date   ABDOMINAL HYSTERECTOMY     APPENDECTOMY     BLADDER SURGERY     mesh removed. revision which has caused everything to fall again   BOWEL RESECTION     took a piece of bowel out after pancreatectomy caused problem with bowel.    BREAST BIOPSY Left 1989   benign   CARDIAC CATHETERIZATION  [id number],5643, 1998   no stents, fatty streaks   CHOLECYSTECTOMY     COLON SURGERY     COLONOSCOPY WITH PROPOFOL N/A 10/08/2018   Procedure: COLONOSCOPY WITH PROPOFOL;  Surgeon: Lollie Sails, MD;  Location: [facility] ENDOSCOPY;  Service: Endoscopy;  Laterality: N/A;   CYST REMOVAL HAND Left 1992   Ganglion cyst removed from Left Wrist   CYSTECTOMY     CYSTO WITH HYDRODISTENSION N/A 03/04/2019   Procedure: CYSTOSCOPY/HYDRODISTENSION;  Surgeon: Bjorn Loser, MD;  Location: [facility] ORS;  Service: Urology;  Laterality: N/A;   DILATATION & CURETTAGE/HYSTEROSCOPY WITH MYOSURE     DILATION AND CURETTAGE OF UTERUS     ERCP     ESOPHAGOGASTRODUODENOSCOPY     HERNIA REPAIR     KNEE ARTHROSCOPY Left 1992   LEFT HEART CATH AND CORONARY ANGIOGRAPHY Left 02/06/2019   Procedure: LEFT HEART CATH AND CORONARY ANGIOGRAPHY;  Surgeon: Yolonda Kida, MD;  Location: [place] CV LAB;  Service: Cardiovascular;  Laterality: Left;   LIVER BIOPSY     PANCREAS SURGERY  2006   partial  pancreatectomy after endoscope knicked a part of pancreas.    PORTA CATH INSERTION N/A 04/10/2017   Procedure: PORTA CATH INSERTION;  Surgeon: Algernon Huxley, MD;  Location: [place] CV LAB;  Service: Cardiovascular;  Laterality: N/A;   PORTA CATH INSERTION N/A 02/21/2019   Procedure: PORTA CATH INSERTION;  Surgeon: Algernon Huxley, MD;  Location: [place] CV LAB;  Service: Cardiovascular;  Laterality: N/A;   REPAIR OF ESOPHAGUS  2012   3 clamps placed in esophagus   ROBOTIC ASSISTED LAPAROSCOPIC VENTRAL/INCISIONAL HERNIA REPAIR N/A 03/15/2018   Procedure: ROBOTIC ASSISTED LAPAROSCOPIC VENTRAL HERNIA REPAIR;  Surgeon: Jules Husbands, MD;  Location: [facility] ORS;  Service: General;  Laterality: N/A;   SMALL BOWEL REPAIR     TONSILLECTOMY      Prior to Admission medications   Medication Sig Start Date  End Date Taking? Authorizing Provider  albuterol (VENTOLIN HFA) 108 (90 Base) MCG/ACT inhaler Inhale into the lungs. 05/07/20 05/07/21 Yes [provider]  amLODipine (NORVASC) 5 MG tablet Take 5 mg by mouth daily.   Yes [provider]  Calcium Carb-Cholecalciferol (CALCIUM 600/VITAMIN D3 PO) Take 1 tablet by mouth daily.   Yes [provider]  Cholecalciferol (VITAMIN D-3) 5000 units TABS Take 5,000 Units by mouth daily.   Yes [provider]  cyanocobalamin 1000 MCG tablet Take by mouth.   Yes [provider]  estradiol (ESTRACE) 0.1 MG/GM vaginal cream Place vaginally. 02/24/20  Yes [provider]  gabapentin (NEURONTIN) 300 MG capsule Take 600 mg by mouth 3 (three) times daily. 08/30/19  Yes [provider]  hydrochlorothiazide (HYDRODIURIL) 25 MG tablet Take by mouth. 04/24/20  Yes [provider]  insulin lispro (HUMALOG) 100 UNIT/ML injection USE UP TO 70 UNITS SUBCUTANEOUSLY PER DAY VIA INSULIN PUMP. Dx code e10.65 05/14/19  Yes [provider]  LACTOBACILLUS PO Take by mouth.   Yes [provider]   lipase/protease/amylase (CREON) 36000 UNITS CPEP capsule Take 144,000 Units by mouth in the morning, at noon, in the evening, and at bedtime.   Yes [provider]  lisinopril (ZESTRIL) 40 MG tablet Take 40 mg by mouth daily. 09/03/18  Yes [provider]  Magnesium 250 MG TABS Take 250 mg by mouth daily.    Yes [provider]  metoprolol succinate (TOPROL-XL) 25 MG 24 hr tablet Take 25 mg by mouth daily.   Yes [provider]  nitroGLYCERIN (NITROSTAT) 0.3 MG SL tablet Place 0.3 mg under the tongue every 5 (five) minutes as needed for chest pain.   Yes [provider]  vitamin E 400 UNIT capsule Take 400 Units by mouth daily.   Yes [provider]  amitriptyline (ELAVIL) 25 MG tablet Take 25-50 mg by mouth at bedtime. Patient not taking: Reported on 08/13/2020 02/24/20   [provider]  Continuous Blood Gluc Receiver (DEXCOM G6 RECEIVER) DEVI Use to monitor blood sugar. [facility] [id number] 08/30/19   [provider]  CONTOUR NEXT TEST test strip 1 each 4 (four) times daily. 07/05/19   [provider]  ELMIRON 100 MG capsule Take 100 mg by mouth 3 (three) times daily. Patient not taking: Reported on 08/13/2020 02/24/20   [provider]  EPINEPHrine 0.3 mg/0.3 mL IJ SOAJ injection SMARTSIG:1 Pre-Filled Pen Syringe IM PRN 05/31/19   [provider]  MITIGARE 0.6 MG CAPS Take 1 capsule by mouth 2 (two) times daily. Patient not taking: Reported on 08/13/2020 04/09/19   [provider]  oxyCODONE-acetaminophen (PERCOCET) 5-325 MG tablet Take 1 tablet by mouth every 8 (eight) hours as needed. Patient not taking: Reported on 08/13/2020 09/09/19   Earleen Newport, MD  pantoprazole (PROTONIX) 40 MG tablet Take 1 tablet (40 mg total) by mouth daily. 07/26/19 07/25/20  Nena Polio, MD  potassium chloride (KLOR-CON) 10 MEQ tablet Take 1 tablet (10 mEq total) by mouth 2 (two) times daily for 5 days. 04/12/20  04/17/20  Sharen Hones, MD    Allergies Ciprofloxacin, Demerol [meperidine], Dilaudid [hydromorphone hcl], Metoclopramide, Morphine and related, Morpholine salicylate, Isosorbide nitrate, Sulfa antibiotics, Flagyl [metronidazole], Adhesive [tape], Darvon [propoxyphene], Flexeril [cyclobenzaprine], Levaquin [levofloxacin in d5w], Naproxen, Soma [carisoprodol], and Zofran [ondansetron hcl]  Family History  Problem Relation Age of Onset   Cirrhosis Mother    Colon cancer Mother 10  TAH/BSO in ~40; deceased at 70   Hypertension Mother    Breast cancer Mother 64       unconfirmed   Hypertension Father    Prostate cancer Father 51       currently 15   Other Father        liver disease   Breast cancer Maternal Aunt        age at dx unk.; currently 57   Cervical cancer Maternal Aunt    Cancer Maternal Uncle        unk. type; currently 31s   Stomach cancer Paternal Aunt 13       deceased at 22   Breast cancer Maternal Grandmother 38       possibly bilateral in 5s; deceased 63   Non-Hodgkin's lymphoma Daughter        unconfirmed; currently 18   Cancer Maternal Aunt        hematologic malignancy; deceased 45    Social History Social History   Tobacco Use   Smoking status: Never   Smokeless tobacco: Never  Vaping Use   Vaping Use: Never used  Substance Use Topics   Alcohol use: Yes    Comment: rarely   Drug use: No    Review of Systems Constitutional: No fever/chills Eyes: No visual changes. ENT: No sore throat. Cardiovascular: Denies chest pain. Respiratory: + Intermittent shortness of breath. Gastrointestinal: No abdominal pain.  No nausea, no vomiting.  No diarrhea.  No constipation. Genitourinary: Negative for dysuria. Musculoskeletal: Negative for back pain. Skin: Negative for rash. Neurological: + headaches, + left-sided focal weakness, denies numbness.  ____________________________________________   PHYSICAL EXAM:  VITAL SIGNS: ED Triage Vitals  Enc  Vitals Group     BP 08/13/20 1219 (!) 159/98     Pulse Rate 08/13/20 1219 (!) 107     Resp 08/13/20 1219 18     Temp 08/13/20 1219 99 F (37.2 C)     Temp Source 08/13/20 1219 Oral     SpO2 08/13/20 1219 96 %     Weight 08/13/20 1350 165 lb (74.8 kg)     Height 08/13/20 1350 5' 4"  (1.626 m)     Head Circumference --      Peak Flow --      Pain Score --      Pain Loc --      Pain Edu? --      Excl. in [place]? --    Constitutional: Alert and oriented. Well appearing and in no acute distress. Eyes: Conjunctivae are normal. PERRL. EOMI. Head: Atraumatic. Nose: No congestion/rhinnorhea. Mouth/Throat: Mucous membranes are moist.  Neck: No stridor.   Cardiovascular: Normal rate, regular rhythm. Grossly normal heart sounds.  Good peripheral circulation. Respiratory: Normal respiratory effort.  No retractions. Lungs CTAB. Gastrointestinal: Soft and nontender. No distention. No abdominal bruits. No CVA tenderness. Musculoskeletal: No lower extremity tenderness nor edema.  No joint effusions. Neurologic:  Normal speech and language.  There is mildly decreased left-sided grip strength, 4/5 left-sided plantar flexion and dorsiflexion compared to 5/5 contralaterally.  Cranial nerves II through XII exam is grossly intact with mildly decreased nasolabial fold noted on the left.  No pronator drift.  No gait instability. Skin:  Skin is warm, dry and intact. No rash noted. Psychiatric: Mood and affect are normal. Speech and behavior are normal.  ____________________________________________   LABS (all labs ordered are listed, but only abnormal results are displayed)  Labs Reviewed  BASIC METABOLIC PANEL - Abnormal; Notable  for the following components:      Result Value   Potassium 3.2 (*)    Glucose, Bld 209 (*)    Calcium 8.1 (*)    All other components within normal limits  CBC - Abnormal; Notable for the following components:   MCHC 36.6 (*)    All other components within normal limits   URINALYSIS, COMPLETE (UACMP) WITH MICROSCOPIC - Abnormal; Notable for the following components:   Color, Urine YELLOW (*)    APPearance CLEAR (*)    Bacteria, UA RARE (*)    All other components within normal limits  CBG MONITORING, ED - Abnormal; Notable for the following components:   Glucose-Capillary 182 (*)    All other components within normal limits  RESP PANEL BY RT-PCR (FLU A&B, COVID) ARPGX2  HEMOGLOBIN A1C  LIPID PANEL  POC URINE PREG, ED  TROPONIN I (HIGH SENSITIVITY)   ____________________________________________  EKG  Sinus tachycardia with a rate of 101.  No ST elevation or depression to suggest acute ischemia. ____________________________________________  RADIOLOGY I, Marlana Salvage, personally viewed and evaluated these images (plain radiographs) as part of my medical decision making, as well as reviewing the written report by the radiologist.  ED provider interpretation: Chest x-ray is reviewed by me, no evidence of acute cardiopulmonary disease.  See radiology report for CT findings.  Official radiology report(s): CT ANGIO HEAD NECK W WO CM  Result Date: 08/13/2020 CLINICAL DATA:  Neuro deficit, acute stroke suspected. Left-sided weakness. EXAM: CT ANGIOGRAPHY HEAD AND NECK TECHNIQUE: Multidetector CT imaging of the head and neck was performed using the standard protocol during bolus administration of intravenous contrast. Multiplanar CT image reconstructions and MIPs were obtained to evaluate the vascular anatomy. Carotid stenosis measurements (when applicable) are obtained utilizing NASCET criteria, using the distal internal carotid diameter as the denominator. CONTRAST:  53m OMNIPAQUE IOHEXOL 350 MG/ML SOLN COMPARISON:  CT head same day.  MRA neck 10/28/2015. FINDINGS: CTA NECK FINDINGS Aortic arch: Great vessel origins are patent. Right carotid system: No evidence of dissection, stenosis (50% or greater) or occlusion. Left carotid system: No evidence of  dissection, stenosis (50% or greater) or occlusion. Vertebral arteries: Mildly left dominant. No evidence of dissection, stenosis (50% or greater) or occlusion. Skeleton: No evidence of acute abnormality on limited assessment. Other neck: No acute abnormality. Upper chest: The visualized lung apices are clear. Review of the MIP images confirms the above findings CTA HEAD FINDINGS Anterior circulation: Bilateral ICAs, MCAs, and ACAs are patent without proximal flow limiting stenosis. No aneurysm identified Posterior circulation: Bilateral intradural vertebral arteries, basilar artery, and posterior cerebral arteries are patent without evidence of flow limiting proximal stenosis. Prominent right posterior communicating artery with small right P1 PCA, anatomic variant. No aneurysm identified. Venous sinuses: As permitted by contrast timing, patent. Rounded arachnoid granulations in the superior sagittal sinus and distal transverse sinuses. Small left transverse and sigmoid sinuses. Anatomic variants: See above. Review of the MIP images confirms the above findings IMPRESSION: No large vessel occlusion or proximal flow limiting stenosis in the head or neck. Electronically Signed   By: FMargaretha SheffieldMD   On: 08/13/2020 15:38   DG Chest 2 View  Result Date: 08/13/2020 CLINICAL DATA:  Chest pain EXAM: CHEST - 2 VIEW COMPARISON:  04/11/2020 FINDINGS: Right-sided central venous port tip over the cavoatrial region. No focal opacity or pleural effusion. Normal cardiomediastinal silhouette. No pneumothorax. IMPRESSION: No active cardiopulmonary disease. Electronically Signed   By: KMadie RenoD.  On: 08/13/2020 15:23   CT HEAD WO CONTRAST  Result Date: 08/13/2020 CLINICAL DATA:  Neuro deficit, acute, stroke suspected. Additional history provided: Elevated blood pressure for the past week, left-sided weakness. EXAM: CT HEAD WITHOUT CONTRAST TECHNIQUE: Contiguous axial images were obtained from the base of the  skull through the vertex without intravenous contrast. COMPARISON:  Brain MRI 04/11/2020. Prior head CT examinations 04/11/2020 and earlier. FINDINGS: Brain: Cerebral volume is normal. Moderate patchy and ill-defined hypoattenuation within the cerebral white matter, nonspecific but most often secondary to chronic small vessel ischemia. There is no acute intracranial hemorrhage. No demarcated cortical infarct. No extra-axial fluid collection. No evidence of an intracranial mass. No midline shift. Vascular: No hyperdense vessel.  Atherosclerotic calcifications Skull: Normal. Negative for fracture or focal lesion. Sinuses/Orbits: Visualized orbits show no acute finding. No significant paranasal sinus disease at the imaged levels. IMPRESSION: No evidence of acute intracranial abnormality. Stable moderate chronic cerebral white matter disease, nonspecific but most often secondary to chronic small vessel ischemia. Electronically Signed   By: Kellie Simmering DO   On: 08/13/2020 12:43      ____________________________________________   INITIAL IMPRESSION / ASSESSMENT AND PLAN / ED COURSE  As part of my medical decision making, I reviewed the following data within the [place] notes reviewed and incorporated, Labs reviewed, Radiograph reviewed, Discussed with admitting physician Dr. Tobie Poet, Evaluated by EM attending Dr. Cheri Fowler, and Notes from prior ED visits        Patient is a 55 year old female with complex past medical history who reports to the emergency department for evaluation of increasing hypertension over the last week with new episode of left-sided weakness that began at 830 this morning.  She does report 1 short-lived episode of similar weakness 1 week ago, however reports that she was back to baseline.  See HPI for further details.  Exam does show some mild weakness of the left side.  Remainder of exam is grossly unremarkable.  Laboratory evaluation was obtained and is  grossly unremarkable.  Chest x-ray is without any acute pathology.  Obtained CT of the head noncontrast as well as CT angio of the head neck without any evidence of large vessel occlusion.  At this time, will admit the patient to the hospital for TIA versus stroke work-up.  Case was discussed with Dr. Tobie Poet who agrees to admit the patient.  Patient is amenable with this plan.      ____________________________________________   FINAL CLINICAL IMPRESSION(S) / ED DIAGNOSES  Final diagnoses:  Neurological deficit present  Hypertension, unspecified type     ED Discharge Orders     None        Note:  This document was prepared using Dragon voice recognition software and may include unintentional dictation errors.    Marlana Salvage, PA 08/13/20 1844    Naaman Plummer, MD 08/14/20 2329

## 2020-08-13 NOTE — ED Notes (Signed)
Lab called and adding on troponin to previous lab work.

## 2020-08-13 NOTE — ED Notes (Signed)
Performed CBG per pt request. Provided graham crackers and apple sauce per pt request.

## 2020-08-13 NOTE — ED Notes (Signed)
ED Provider at bedside. 

## 2020-08-13 NOTE — H&P (Signed)
History and Physical   [name] UTM:[id number] DOB: 1966-01-16 DOA: 08/13/2020  PCP: Amber Harrier, MD  Outpatient Specialists: Dr. Manuella Christensen, neurology Patient coming from: Home  I have personally briefly reviewed patient's old medical records in [place].  Chief Concern: Chest pain  HPI: Amber Christensen is a 55 y.o. female with medical history significant for hypertension, GERD, neuropathy, presents to the emergency department for chief concerns of left-sided weakness numbness and tingling and chest pain.  She denies fever.   She endorses chest pain, comes on for a few minutes, not changed with exertion or rest. She also has shortness of breath associated with the chest pain.She reports that the chest pain started about 1 week ago.  She did not present to the emergency department as she was waiting for a call back from her cardiology and neurologist.  However, receiving messages from there is upon receiving message from her specialist she was told to come to the emergency department for further evaluation.  She reports that prior to EMS arrival her wrist blood pressure check was 174/118.  And when EMS arrived her blood pressure was 202/184.  She states that she still has numbness and tingling of her left upper extremity.  Social history: She lives at home with her husband.  She denies tobacco, recreational drug use.  She infrequently drinks alcohol.  She drinks 1 glass of wine when having dinner.  Vaccination history: Patient is vaccinated for COVID-19, 2 doses, Pfizer on 05/05/2019 and 06/05/2019.  ROS: Constitutional: no weight change, no fever ENT/Mouth: no sore throat, no rhinorrhea Eyes: no eye pain, no vision changes Cardiovascular: no chest pain, no dyspnea,  no edema, no palpitations Respiratory: no cough, no sputum, no wheezing Gastrointestinal: no nausea, no vomiting, no diarrhea, no constipation Genitourinary: no urinary incontinence, no dysuria, no  hematuria Musculoskeletal: no arthralgias, no myalgias Skin: no skin lesions, no pruritus, Neuro: + weakness, no loss of consciousness, no syncope Psych: no anxiety, no depression, + decrease appetite Heme/Lymph: no bruising, no bleeding  ED Course: Discussed with EDP, patient requiring hospitalization for left-sided weakness and stroke work-up.  Vitals in the emergency department was remarkable for temperature 99, respiration rate 18, heart rate 107, blood pressure 159/90, SPO2 of 90% on room air.  CT of the head without contrast was negative for stroke CT a of the head and neck with and without contrast was negative  Assessment/Plan  Active Problems:   TIA (transient ischemic attack)   Left sided weakness and numbness and tingling - CT of the head without contrast was negative - MRI of the head was negative - CTA of the head and neck with and without contrast was also negative - I suspect this is also psychosomatic as patient presented with almost similar presentation in February 2022 -Would recommend neurologist to consider outpatient nerve conduction study -B12 was checked in February and it was elevated at 572 - Checking magnesium level  Chest pain and shortness of breath- -This is not cardiac related chest pain or shortness of breath - I have no clinical suspicion for ACS at this time as troponin is negative x2 and on previous examination in February 2022, her troponin was also negative -I suspect this is psychosomatic -Checking D-dimer, if negative I will not order a CTA of the chest  Possible obstructive sleep apnea-CPAP nightly ordered - Patient has been advised by myself to discuss with PCP regarding outpatient sleep study  Hypertension-on multiple antihypertensive medication - Resumed home antihypertensives  including amlodipine 5 mg daily, lisinopril 40 mg daily, metoprolol succinate 25 mg daily - Outpatient follow-up with her cardiologist for blood pressure  control - Labetalol 5 mg IV every 2 hours as needed for SBP greater than 180, 3 doses ordered  GERD-PPI  Neuropathy-gabapentin 600 mg p.o. 3 times daily  Hypokalemia-replace with potassium chloride 40 mill equivalent once - Magnesium in the a.m. - BMP in a.m.  Chart reviewed.   Echo on 04/12/2020 was read as ejection fraction 60 to 44%, grade 1 diastolic dysfunction  DVT prophylaxis: Enoxaparin 40 mg subcutaneous every 24 hours Code Status: Full code Diet: Heart healthy Family Communication: No Disposition Plan: Pending clinical course, anticipate discharge on 7/1 Consults called: None at this time Admission status: Observation, MedSurg, telemetry  Past Medical History:  Diagnosis Date   Allergy    many many allergies   Anemia    in past   Anginal pain ([place]) 01/2018   cardiac workup clear   Anxiety    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[place])    pt unaware of this diagnosis   Bipolar disorder ([place])    Bladder mass    removed and had tbt. mesh removed   Blood transfusion without reported diagnosis 1999   received 20 units of blood after tearing esophagus from vomitting so severely after ERCP   Breast mass    left side biopsy was negative   Carpal tunnel syndrome    left hand   Cervical cancer ([place]) 2002   Chronic kidney disease    cyst on left kidney   Chronic pancreatitis ([place])    prior to pancreas surgery   Cirrhosis ([place])    Clotting disorder ([place])    pt states that sometimes she has difficulty stopping to bleed and other times it is okay   Depression    Diabetes mellitus without complication (HCC)    has insulin pump   Dyspnea    Esophagitis    GERD (gastroesophageal reflux disease)    Headache    migraines...takes compazine for this   Heart disease    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 left kidney mass.   Liver  disease    NASH. has had liver biopsies. negative except for NASH   NASH (nonalcoholic steatohepatitis)    NASH (nonalcoholic steatohepatitis)    Pancreatitis    PONV (postoperative nausea and vomiting)    Stroke ([place]) 11/2017   had tia. no longer needs plavix   Thyroid disease    Past Surgical History:  Procedure Laterality Date   ABDOMINAL HYSTERECTOMY     APPENDECTOMY     BLADDER SURGERY     mesh removed. revision which has caused everything to fall again   BOWEL RESECTION     took a piece of bowel out after pancreatectomy caused problem with bowel.    BREAST BIOPSY Left 1989   benign   CARDIAC CATHETERIZATION  0102,[id number], 1998   no stents, fatty streaks   CHOLECYSTECTOMY     COLON SURGERY     COLONOSCOPY WITH PROPOFOL N/A 10/08/2018   Procedure: COLONOSCOPY WITH PROPOFOL;  Surgeon: Lollie Sails, MD;  Location: [facility] ENDOSCOPY;  Service: Endoscopy;  Laterality: N/A;   CYST REMOVAL HAND Left 1992   Ganglion cyst removed from Left Wrist   CYSTECTOMY  CYSTO WITH HYDRODISTENSION N/A 03/04/2019   Procedure: CYSTOSCOPY/HYDRODISTENSION;  Surgeon: Bjorn Loser, MD;  Location: [facility] ORS;  Service: Urology;  Laterality: N/A;   DILATATION & CURETTAGE/HYSTEROSCOPY WITH MYOSURE     DILATION AND CURETTAGE OF UTERUS     ERCP     ESOPHAGOGASTRODUODENOSCOPY     HERNIA REPAIR     KNEE ARTHROSCOPY Left 1992   LEFT HEART CATH AND CORONARY ANGIOGRAPHY Left 02/06/2019   Procedure: LEFT HEART CATH AND CORONARY ANGIOGRAPHY;  Surgeon: Yolonda Kida, MD;  Location: [place] CV LAB;  Service: Cardiovascular;  Laterality: Left;   LIVER BIOPSY     PANCREAS SURGERY  2006   partial pancreatectomy after endoscope knicked a part of pancreas.    PORTA CATH INSERTION N/A 04/10/2017   Procedure: PORTA CATH INSERTION;  Surgeon: Algernon Huxley, MD;  Location: [place] CV LAB;  Service: Cardiovascular;  Laterality: N/A;   PORTA CATH INSERTION N/A 02/21/2019   Procedure: PORTA CATH  INSERTION;  Surgeon: Algernon Huxley, MD;  Location: [place] CV LAB;  Service: Cardiovascular;  Laterality: N/A;   REPAIR OF ESOPHAGUS  2012   3 clamps placed in esophagus   ROBOTIC ASSISTED LAPAROSCOPIC VENTRAL/INCISIONAL HERNIA REPAIR N/A 03/15/2018   Procedure: ROBOTIC ASSISTED LAPAROSCOPIC VENTRAL HERNIA REPAIR;  Surgeon: Jules Husbands, MD;  Location: [facility] ORS;  Service: General;  Laterality: N/A;   SMALL BOWEL REPAIR     TONSILLECTOMY     Social History:  reports that she has never smoked. She has never used smokeless tobacco. She reports current alcohol use. She reports that she does not use drugs.  Allergies  Allergen Reactions   Ciprofloxacin     Rash, nausea vomitting   Demerol [Meperidine] Hives and Other (See Comments)    Pt states that this medication causes cardiac arrest.     Dilaudid [Hydromorphone Hcl] Nausea And Vomiting and Other (See Comments)    Pt states that this medication causes cardiac arrest.     Metoclopramide Hives    Breathing issues   Morphine And Related Anaphylaxis and Other (See Comments)    Pt states that this medication causes cardiac arrest   Morpholine Salicylate Nausea And Vomiting, Other (See Comments) and Rash    CHF   Isosorbide Nitrate Other (See Comments)    Headache    Sulfa Antibiotics Nausea And Vomiting   Flagyl [Metronidazole]     Rash, heart racing, nausea vomitting   Adhesive [Tape] Rash    Blisters skin Paper tape is okay   Darvon [Propoxyphene] Nausea And Vomiting and Rash   Flexeril [Cyclobenzaprine] Nausea And Vomiting and Rash   Levaquin [Levofloxacin In D5w] Nausea And Vomiting and Rash   Naproxen Rash    Ibuprofen and advil are not a problem   Soma [Carisoprodol] Nausea And Vomiting and Rash   Zofran [Ondansetron Hcl] Nausea And Vomiting and Rash   Family History  Problem Relation Age of Onset   Cirrhosis Mother    Colon cancer Mother 24       TAH/BSO in ~63; deceased at 34   Hypertension Mother    Breast  cancer Mother 64       unconfirmed   Hypertension Father    Prostate cancer Father 23       currently 54   Other Father        liver disease   Breast cancer Maternal Aunt        age at dx unk.; currently 86  Cervical cancer Maternal Aunt    Cancer Maternal Uncle        unk. type; currently 6s   Stomach cancer Paternal Aunt 73       deceased at 46   Breast cancer Maternal Grandmother 71       possibly bilateral in 22s; deceased 91   Non-Hodgkin's lymphoma Daughter        unconfirmed; currently 55   Cancer Maternal Aunt        hematologic malignancy; deceased 36   Family history: Family history reviewed and not pertinent  Prior to Admission medications   Medication Sig Start Date End Date Taking? Authorizing Provider  albuterol (VENTOLIN HFA) 108 (90 Base) MCG/ACT inhaler Inhale into the lungs. 05/07/20 05/07/21 Yes [provider]  amLODipine (NORVASC) 5 MG tablet Take 5 mg by mouth daily.   Yes [provider]  Calcium Carb-Cholecalciferol (CALCIUM 600/VITAMIN D3 PO) Take 1 tablet by mouth daily.   Yes [provider]  Cholecalciferol (VITAMIN D-3) 5000 units TABS Take 5,000 Units by mouth daily.   Yes [provider]  cyanocobalamin 1000 MCG tablet Take by mouth.   Yes [provider]  estradiol (ESTRACE) 0.1 MG/GM vaginal cream Place vaginally. 02/24/20  Yes [provider]  gabapentin (NEURONTIN) 300 MG capsule Take 600 mg by mouth 3 (three) times daily. 08/30/19  Yes [provider]  hydrochlorothiazide (HYDRODIURIL) 25 MG tablet Take by mouth. 04/24/20  Yes [provider]  insulin lispro (HUMALOG) 100 UNIT/ML injection USE UP TO 70 UNITS SUBCUTANEOUSLY PER DAY VIA INSULIN PUMP. Dx code e10.65 05/14/19  Yes [provider]  LACTOBACILLUS PO Take by mouth.   Yes [provider]  lipase/protease/amylase (CREON) 36000 UNITS CPEP capsule Take 144,000 Units by mouth in the morning, at noon, in the  evening, and at bedtime.   Yes [provider]  lisinopril (ZESTRIL) 40 MG tablet Take 40 mg by mouth daily. 09/03/18  Yes [provider]  Magnesium 250 MG TABS Take 250 mg by mouth daily.    Yes [provider]  metoprolol succinate (TOPROL-XL) 25 MG 24 hr tablet Take 25 mg by mouth daily.   Yes [provider]  nitroGLYCERIN (NITROSTAT) 0.3 MG SL tablet Place 0.3 mg under the tongue every 5 (five) minutes as needed for chest pain.   Yes [provider]  vitamin E 400 UNIT capsule Take 400 Units by mouth daily.   Yes [provider]  amitriptyline (ELAVIL) 25 MG tablet Take 25-50 mg by mouth at bedtime. Patient not taking: Reported on 08/13/2020 02/24/20   [provider]  Continuous Blood Gluc Receiver (DEXCOM G6 RECEIVER) DEVI Use to monitor blood sugar. [facility] [id number] 08/30/19   [provider]  CONTOUR NEXT TEST test strip 1 each 4 (four) times daily. 07/05/19   [provider]  ELMIRON 100 MG capsule Take 100 mg by mouth 3 (three) times daily. Patient not taking: Reported on 08/13/2020 02/24/20   [provider]  EPINEPHrine 0.3 mg/0.3 mL IJ SOAJ injection SMARTSIG:1 Pre-Filled Pen Syringe IM PRN 05/31/19   [provider]  MITIGARE 0.6 MG CAPS Take 1 capsule by mouth 2 (two) times daily. Patient not taking: Reported on 08/13/2020 04/09/19   [provider]  oxyCODONE-acetaminophen (PERCOCET) 5-325 MG tablet Take 1 tablet by mouth every 8 (eight) hours as needed. Patient not taking: Reported on 08/13/2020 09/09/19   Earleen Newport, MD  pantoprazole (PROTONIX) 40 MG tablet Take 1  tablet (40 mg total) by mouth daily. 07/26/19 07/25/20  Nena Polio, MD  potassium chloride (KLOR-CON) 10 MEQ tablet Take 1 tablet (10 mEq total) by mouth 2 (two) times daily for 5 days. 04/12/20 04/17/20  Sharen Hones, MD   Physical Exam: Vitals:   08/13/20 1350 08/13/20 1430 08/13/20 1600 08/13/20 1730  BP:   (!) 148/94 (!) 145/84 (!) 155/94  Pulse:  89 85 89  Resp:  16 19 16   Temp:      TempSrc:      SpO2:  97% 98% 98%  Weight: 74.8 kg     Height: 5' 4"  (1.626 m)      Constitutional: appears age-appropriate, NAD, calm, comfortable Eyes: PERRL, lids and conjunctivae normal ENMT: Mucous membranes are moist. Posterior pharynx clear of any exudate or lesions. Age-appropriate dentition. Hearing appropriate Neck: normal, supple, no masses, no thyromegaly Respiratory: clear to auscultation bilaterally, no wheezing, no crackles. Normal respiratory effort. No accessory muscle use.  Cardiovascular: Regular rate and rhythm, no murmurs / rubs / gallops. No extremity edema. 2+ pedal pulses. No carotid bruits. Right upper chest port a cath Abdomen: no tenderness, no masses palpated, no hepatosplenomegaly. Bowel sounds positive.  Musculoskeletal: no clubbing / cyanosis. No joint deformity upper and lower extremities. Good ROM, no contractures, no atrophy. Normal muscle tone.  Skin: no rashes, lesions, ulcers. No induration Neurologic: Sensation intact. Strength 5/5 in all 4.  Psychiatric: Normal judgment and insight. Alert and oriented x 3. Normal mood.   EKG: independently reviewed, showing sinus tachycardia with rate of 101, QTc 451  Chest x-ray on Admission: I personally reviewed and I agree with radiologist reading as below.  CT ANGIO HEAD NECK W WO CM  Result Date: 08/13/2020 CLINICAL DATA:  Neuro deficit, acute stroke suspected. Left-sided weakness. EXAM: CT ANGIOGRAPHY HEAD AND NECK TECHNIQUE: Multidetector CT imaging of the head and neck was performed using the standard protocol during bolus administration of intravenous contrast. Multiplanar CT image reconstructions and MIPs were obtained to evaluate the vascular anatomy. Carotid stenosis measurements (when applicable) are obtained utilizing NASCET criteria, using the distal internal carotid diameter as the denominator. CONTRAST:  6m OMNIPAQUE  IOHEXOL 350 MG/ML SOLN COMPARISON:  CT head same day.  MRA neck 10/28/2015. FINDINGS: CTA NECK FINDINGS Aortic arch: Great vessel origins are patent. Right carotid system: No evidence of dissection, stenosis (50% or greater) or occlusion. Left carotid system: No evidence of dissection, stenosis (50% or greater) or occlusion. Vertebral arteries: Mildly left dominant. No evidence of dissection, stenosis (50% or greater) or occlusion. Skeleton: No evidence of acute abnormality on limited assessment. Other neck: No acute abnormality. Upper chest: The visualized lung apices are clear. Review of the MIP images confirms the above findings CTA HEAD FINDINGS Anterior circulation: Bilateral ICAs, MCAs, and ACAs are patent without proximal flow limiting stenosis. No aneurysm identified Posterior circulation: Bilateral intradural vertebral arteries, basilar artery, and posterior cerebral arteries are patent without evidence of flow limiting proximal stenosis. Prominent right posterior communicating artery with small right P1 PCA, anatomic variant. No aneurysm identified. Venous sinuses: As permitted by contrast timing, patent. Rounded arachnoid granulations in the superior sagittal sinus and distal transverse sinuses. Small left transverse and sigmoid sinuses. Anatomic variants: See above. Review of the MIP images confirms the above findings IMPRESSION: No large vessel occlusion or proximal flow limiting stenosis in the head or neck. Electronically Signed   By: FMargaretha SheffieldMD   On: 08/13/2020 15:38   DG Chest 2 View  Result Date: 08/13/2020 CLINICAL DATA:  Chest pain EXAM: CHEST - 2 VIEW COMPARISON:  04/11/2020 FINDINGS: Right-sided central venous port tip over the cavoatrial region. No focal opacity or pleural effusion. Normal cardiomediastinal silhouette. No pneumothorax. IMPRESSION: No active cardiopulmonary disease. Electronically Signed   By: Donavan Foil M.D.   On: 08/13/2020 15:23   CT HEAD WO  CONTRAST  Result Date: 08/13/2020 CLINICAL DATA:  Neuro deficit, acute, stroke suspected. Additional history provided: Elevated blood pressure for the past week, left-sided weakness. EXAM: CT HEAD WITHOUT CONTRAST TECHNIQUE: Contiguous axial images were obtained from the base of the skull through the vertex without intravenous contrast. COMPARISON:  Brain MRI 04/11/2020. Prior head CT examinations 04/11/2020 and earlier. FINDINGS: Brain: Cerebral volume is normal. Moderate patchy and ill-defined hypoattenuation within the cerebral white matter, nonspecific but most often secondary to chronic small vessel ischemia. There is no acute intracranial hemorrhage. No demarcated cortical infarct. No extra-axial fluid collection. No evidence of an intracranial mass. No midline shift. Vascular: No hyperdense vessel.  Atherosclerotic calcifications Skull: Normal. Negative for fracture or focal lesion. Sinuses/Orbits: Visualized orbits show no acute finding. No significant paranasal sinus disease at the imaged levels. IMPRESSION: No evidence of acute intracranial abnormality. Stable moderate chronic cerebral white matter disease, nonspecific but most often secondary to chronic small vessel ischemia. Electronically Signed   By: Kellie Simmering DO   On: 08/13/2020 12:43    Labs on Admission: I have personally reviewed following labs  CBC: Recent Labs  Lab 08/13/20 1359  WBC 8.7  HGB 13.2  HCT 36.1  MCV 83.2  PLT 035   Basic Metabolic Panel: Recent Labs  Lab 08/13/20 1359  NA 138  K 3.2*  CL 99  CO2 29  GLUCOSE 209*  BUN 12  CREATININE 0.78  CALCIUM 8.1*   GFR: Estimated Creatinine Clearance: 78.6 mL/min (by C-G formula based on SCr of 0.78 mg/dL).  CBG: Recent Labs  Lab 08/13/20 1716  GLUCAP 182*   Urine analysis:    Component Value Date/Time   COLORURINE YELLOW (A) 08/13/2020 1221   APPEARANCEUR CLEAR (A) 08/13/2020 1221   APPEARANCEUR Cloudy (A) 08/12/2019 0851   LABSPEC 1.028 08/13/2020  1221   LABSPEC 1.018 01/31/2014 1455   PHURINE 5.0 08/13/2020 1221   GLUCOSEU NEGATIVE 08/13/2020 1221   GLUCOSEU >=500 01/31/2014 1455   HGBUR NEGATIVE 08/13/2020 [place] 08/13/2020 1221   BILIRUBINUR Negative 08/12/2019 0851   BILIRUBINUR Negative 01/31/2014 1455   KETONESUR NEGATIVE 08/13/2020 1221   PROTEINUR NEGATIVE 08/13/2020 1221   NITRITE NEGATIVE 08/13/2020 [place] 08/13/2020 1221   LEUKOCYTESUR Negative 01/31/2014 1455   Dr. Tobie Poet [facility]  If 7PM-7AM, please contact overnight-coverage provider If 7AM-7PM, please contact day coverage provider [email or web address]  08/13/2020, 6:27 PM

## 2020-08-13 NOTE — ED Notes (Signed)
Pt refused to have labs drawn, wants it done through her porta cath. Explained to pt that we will have to wait until she is in an exam to access her port

## 2020-08-14 ENCOUNTER — Encounter: Payer: Medicaid Other | Admitting: Obstetrics

## 2020-08-14 ENCOUNTER — Encounter: Payer: Self-pay | Admitting: Internal Medicine

## 2020-08-14 DIAGNOSIS — G459 Transient cerebral ischemic attack, unspecified: Secondary | ICD-10-CM | POA: Diagnosis not present

## 2020-08-14 DIAGNOSIS — R29818 Other symptoms and signs involving the nervous system: Secondary | ICD-10-CM

## 2020-08-14 DIAGNOSIS — R531 Weakness: Secondary | ICD-10-CM

## 2020-08-14 LAB — CBC WITH DIFFERENTIAL/PLATELET
Abs Immature Granulocytes: 0.02 10*3/uL (ref 0.00–0.07)
Basophils Absolute: 0 10*3/uL (ref 0.0–0.1)
Basophils Relative: 1 %
Eosinophils Absolute: 0.1 10*3/uL (ref 0.0–0.5)
Eosinophils Relative: 2 %
HCT: 34.4 % — ABNORMAL LOW (ref 36.0–46.0)
Hemoglobin: 12.3 g/dL (ref 12.0–15.0)
Immature Granulocytes: 0 %
Lymphocytes Relative: 36 %
Lymphs Abs: 2.2 10*3/uL (ref 0.7–4.0)
MCH: 30.7 pg (ref 26.0–34.0)
MCHC: 35.8 g/dL (ref 30.0–36.0)
MCV: 85.8 fL (ref 80.0–100.0)
Monocytes Absolute: 0.3 10*3/uL (ref 0.1–1.0)
Monocytes Relative: 5 %
Neutro Abs: 3.4 10*3/uL (ref 1.7–7.7)
Neutrophils Relative %: 56 %
Platelets: 190 10*3/uL (ref 150–400)
RBC: 4.01 MIL/uL (ref 3.87–5.11)
RDW: 12.4 % (ref 11.5–15.5)
WBC: 6 10*3/uL (ref 4.0–10.5)
nRBC: 0 % (ref 0.0–0.2)

## 2020-08-14 LAB — GLUCOSE, CAPILLARY
Glucose-Capillary: 220 mg/dL — ABNORMAL HIGH (ref 70–99)
Glucose-Capillary: 297 mg/dL — ABNORMAL HIGH (ref 70–99)
Glucose-Capillary: 296 mg/dL — ABNORMAL HIGH (ref 70–99)
Glucose-Capillary: 257 mg/dL — ABNORMAL HIGH (ref 70–99)

## 2020-08-14 LAB — COMPREHENSIVE METABOLIC PANEL
ALT: 40 U/L (ref 0–44)
AST: 37 U/L (ref 15–41)
Albumin: 3.3 g/dL — ABNORMAL LOW (ref 3.5–5.0)
Alkaline Phosphatase: 132 U/L — ABNORMAL HIGH (ref 38–126)
Anion gap: 6 (ref 5–15)
BUN: 11 mg/dL (ref 6–20)
CO2: 29 mmol/L (ref 22–32)
Calcium: 8.1 mg/dL — ABNORMAL LOW (ref 8.9–10.3)
Chloride: 103 mmol/L (ref 98–111)
Creatinine, Ser: 0.75 mg/dL (ref 0.44–1.00)
GFR, Estimated: >60 mL/min (ref >60)
Glucose, Bld: 294 mg/dL — ABNORMAL HIGH (ref 70–99)
Potassium: 4 mmol/L (ref 3.5–5.1)
Sodium: 138 mmol/L (ref 135–145)
Total Bilirubin: 3.3 mg/dL — ABNORMAL HIGH (ref 0.3–1.2)
Total Protein: 6.2 g/dL — ABNORMAL LOW (ref 6.5–8.1)

## 2020-08-14 LAB — LIPID PANEL
Cholesterol: 119 mg/dL (ref 0–200)
HDL: 32 mg/dL — ABNORMAL LOW (ref >40)
LDL Cholesterol: 74 mg/dL (ref 0–99)
Total CHOL/HDL Ratio: 3.7 RATIO
Triglycerides: 64 mg/dL (ref <150)
VLDL: 13 mg/dL (ref 0–40)

## 2020-08-14 LAB — FOLATE: Folate: 28 ng/mL (ref >5.9)

## 2020-08-14 LAB — MAGNESIUM: Magnesium: 1.5 mg/dL — ABNORMAL LOW (ref 1.7–2.4)

## 2020-08-14 MED ORDER — INSULIN ASPART 100 UNIT/ML IJ SOLN: 0.0000 [IU] | INTRAMUSCULAR | Status: DC

## 2020-08-14 MED ORDER — CHLORHEXIDINE GLUCONATE CLOTH 2 % EX PADS
6.0000 | MEDICATED_PAD | Freq: Every day | CUTANEOUS | Status: DC
  Administered 2020-08-14 – 2020-08-15 (×2): 6 via TOPICAL

## 2020-08-14 MED ORDER — VITAMIN B-12 1000 MCG PO TABS
1000.0000 ug | ORAL_TABLET | Freq: Every day | ORAL | Status: DC
  Administered 2020-08-14 – 2020-08-15 (×2): 1000 ug via ORAL
  Filled 2020-08-14 (×2): qty 1

## 2020-08-14 MED ORDER — FENTANYL CITRATE (PF) 100 MCG/2ML IJ SOLN: 25.0000 ug | INTRAMUSCULAR | Status: DC | PRN

## 2020-08-14 MED ORDER — DOCUSATE SODIUM 100 MG PO CAPS
100.0000 mg | ORAL_CAPSULE | Freq: Two times a day (BID) | ORAL | Status: DC | PRN
  Filled 2020-08-14: qty 1

## 2020-08-14 MED ORDER — MAGNESIUM SULFATE 4 GM/100ML IV SOLN
4.0000 g | Freq: Once | INTRAVENOUS | Status: AC
  Administered 2020-08-14: 11:00:00 4 g via INTRAVENOUS
  Filled 2020-08-14: qty 100

## 2020-08-14 MED ORDER — INSULIN PUMP
Freq: Three times a day (TID) | SUBCUTANEOUS | Status: DC
  Administered 2020-08-14: 13:00:00 6.8 via SUBCUTANEOUS
  Administered 2020-08-14: 5.7 via SUBCUTANEOUS
  Administered 2020-08-14: 18:00:00 6.3 via SUBCUTANEOUS
  Administered 2020-08-15: 2.3 via SUBCUTANEOUS
  Administered 2020-08-15: 1 via SUBCUTANEOUS
  Filled 2020-08-14: qty 1

## 2020-08-14 MED ORDER — ATORVASTATIN CALCIUM 20 MG PO TABS
40.0000 mg | ORAL_TABLET | Freq: Every day | ORAL | Status: DC
  Administered 2020-08-14 – 2020-08-15 (×2): 40 mg via ORAL
  Filled 2020-08-14 (×2): qty 2

## 2020-08-14 MED ORDER — SENNA 8.6 MG PO TABS
1.0000 | ORAL_TABLET | Freq: Every day | ORAL | Status: DC | PRN
  Filled 2020-08-14: qty 1

## 2020-08-14 MED FILL — Insulin Aspart Inj Soln 100 Unit/ML: INTRAMUSCULAR | Qty: 10 | Status: AC

## 2020-08-14 NOTE — Progress Notes (Signed)
PROGRESS NOTE    Amber Christensen  DQQ:[id number] DOB: 11/28/1965 DOA: 08/13/2020 PCP: [name], MD   Chief Complaint  Patient presents with   Weakness   Hypertension    Brief Narrative:  Amber Christensen is Amber Christensen 55 y.o. female with medical history significant for hypertension, GERD, neuropathy, presents to the emergency department for chief concerns of left-sided weakness numbness and tingling and chest pain.   She denies fever.   She endorses chest pain, comes on for Amber Christensen few minutes, not changed with exertion or rest. She also has shortness of breath associated with the chest pain.She reports that the chest pain started about 1 week ago.  She did not present to the emergency department as she was waiting for Amber Christensen call back from her cardiology and neurologist.  However, receiving messages from there is upon receiving message from her specialist she was told to come to the emergency department for further evaluation.   She reports that prior to EMS arrival her wrist blood pressure check was 174/118.  And when EMS arrived her blood pressure was 202/184.   She states that she still has numbness and tingling of her left upper extremity.     Assessment & Plan:   Active Problems:   TIA (transient ischemic attack)  Left Sided Weakness and Paresthesias Seems improved today, but persistent mild 4/5 weakness to L upper and LLE W/u negative for strok - MRI without acute abnormality, chronic microvascular ischemia CTA head/neck without LVO or proximal flow limiting stenosis in head/neck Recently had EEG 07/05/20 which was negative for focality or epileptiform discharge Given persistent mild weakness today, requested neurology evaluation, appreciate recs  Shortness of breath CXR without active cardiopulm disease Negative d dimer and troponin EKG with sinus tach Currently on RA No readily apparent cause of SOB, continue to follow  Chest Discomfort As above, troponin negative, EKG sinus  tachy, negative d dimer/troponin Low suspicion for ACS Follow , address further as needed  Hypomagnesemia being replaced  Concern for OSA Needs outpatient sleep study  Vitamin B12 deficiency Continue supplementation, follow repeat labs  T1DM Continue insulin pump Continue gabapentin for neuropathy  Hypertension Continue lisinopril, amlodipine, metoprolol  GERD PPI  DVT prophylaxis: lovenox Code Status: full  Family Communication: none at bedside Disposition:   Status is: Observation  The patient remains OBS appropriate and will d/c before 2 midnights.  Dispo: The patient is from: Home              Anticipated d/c is to: Home              Patient currently is not medically stable to d/c.   Difficult to place patient No       Consultants:  neurology  Procedures:  none  Antimicrobials:  Anti-infectives (From admission, onward)    None          Subjective: C/o "L sided weakness" starting 05/09/22 - feels "dead" drops things  Objective: Vitals:   08/14/20 0424 08/14/20 0902 08/14/20 1231 08/14/20 1554  BP: 133/81 139/80 134/86 129/83  Pulse: 80 90 86 70  Resp: 16   16  Temp: 97.8 F (36.6 C) 98.2 F (36.8 C) (!) 97.5 F (36.4 C) 98.2 F (36.8 C)  TempSrc:  Oral Axillary   SpO2: 96% 97% 97% 97%  Weight:      Height:        Intake/Output Summary (Last 24 hours) at 08/14/2020 1743 Last data filed at 08/14/2020 1500 Gross per  24 hour  Intake 1053.16 ml  Output --  Net 1053.16 ml   Filed Weights   08/13/20 1350 08/13/20 2253  Weight: 74.8 kg 75.6 kg    Examination:  General exam: Appears calm and comfortable  Respiratory system: Clear to auscultation. Respiratory effort normal. Cardiovascular system: S1 & S2 heard, RRR Gastrointestinal system: Abdomen is nondistended, soft and nontender. Central nervous system: Alert and oriented. CN 2-12 intact.  L sided weakness, 4/5.  FNF intact bilaterally Extremities: no LEE Skin: No rashes,  lesions or ulcers Psychiatry: Judgement and insight appear normal. Mood & affect appropriate.     Data Reviewed: I have personally reviewed following labs and imaging studies  CBC: Recent Labs  Lab 08/13/20 1359 08/14/20 0800  WBC 8.7 6.0  NEUTROABS  --  3.4  HGB 13.2 12.3  HCT 36.1 34.4*  MCV 83.2 85.8  PLT 219 903    Basic Metabolic Panel: Recent Labs  Lab 08/13/20 1359 08/14/20 0600 08/14/20 0930  NA 138  --  138  K 3.2*  --  4.0  CL 99  --  103  CO2 29  --  29  GLUCOSE 209*  --  294*  BUN 12  --  11  CREATININE 0.78  --  0.75  CALCIUM 8.1*  --  8.1*  MG  --  1.5*  --     GFR: Estimated Creatinine Clearance: 79.1 mL/min (by C-G formula based on SCr of 0.75 mg/dL).  Liver Function Tests: Recent Labs  Lab 08/14/20 0930  AST 37  ALT 40  ALKPHOS 132*  BILITOT 3.3*  PROT 6.2*  ALBUMIN 3.3*    CBG: Recent Labs  Lab 08/13/20 1716 08/13/20 2302 08/14/20 0723 08/14/20 1236 08/14/20 1556  GLUCAP 182* 368* 220* 297* 296*     Recent Results (from the past 240 hour(s))  Resp Panel by RT-PCR (Flu Amber Christensen&B, Covid) Nasopharyngeal Swab     Status: None   Collection Time: 08/13/20  5:10 PM   Specimen: Nasopharyngeal Swab; Nasopharyngeal(NP) swabs in vial transport medium  Result Value Ref Range Status   SARS Coronavirus 2 by RT PCR NEGATIVE NEGATIVE Final    Comment: (NOTE) SARS-CoV-2 target nucleic acids are NOT DETECTED.  The SARS-CoV-2 RNA is generally detectable in upper respiratory specimens during the acute phase of infection. The lowest concentration of SARS-CoV-2 viral copies this assay can detect is 138 copies/mL. Amber Christensen negative result does not preclude SARS-Cov-2 infection and should not be used as the sole basis for treatment or other patient management decisions. Amber Christensen negative result may occur with  improper specimen collection/handling, submission of specimen other than nasopharyngeal swab, presence of viral mutation(s) within the areas targeted by  this assay, and inadequate number of viral copies(<138 copies/mL). Amber Christensen negative result must be combined with clinical observations, patient history, and epidemiological information. The expected result is Negative.  Fact Sheet for Patients:  EntrepreneurPulse.com.au  Fact Sheet for Healthcare Providers:  IncredibleEmployment.be  This test is no t yet approved or cleared by the [place] FDA and  has been authorized for detection and/or diagnosis of SARS-CoV-2 by FDA under an Emergency Use Authorization (EUA). This EUA will remain  in effect (meaning this test can be used) for the duration of the COVID-19 declaration under Section 564(b)(1) of the Act, 21 U.S.C.section 360bbb-3(b)(1), unless the authorization is terminated  or revoked sooner.       Influenza Amber Christensen by PCR NEGATIVE NEGATIVE Final   Influenza B by PCR NEGATIVE NEGATIVE Final  Comment: (NOTE) The Xpert Xpress SARS-CoV-2/FLU/RSV plus assay is intended as an aid in the diagnosis of influenza from Nasopharyngeal swab specimens and should not be used as Deneise Getty sole basis for treatment. Nasal washings and aspirates are unacceptable for Xpert Xpress SARS-CoV-2/FLU/RSV testing.  Fact Sheet for Patients: EntrepreneurPulse.com.au  Fact Sheet for Healthcare Providers: IncredibleEmployment.be  This test is not yet approved or cleared by the [place] FDA and has been authorized for detection and/or diagnosis of SARS-CoV-2 by FDA under an Emergency Use Authorization (EUA). This EUA will remain in effect (meaning this test can be used) for the duration of the COVID-19 declaration under Section 564(b)(1) of the Act, 21 U.S.C. section 360bbb-3(b)(1), unless the authorization is terminated or revoked.  Performed at [facility], [address], [place], [place] [address]          Radiology Studies: CT ANGIO HEAD NECK W WO CM  Result  Date: 08/13/2020 CLINICAL DATA:  Neuro deficit, acute stroke suspected. Left-sided weakness. EXAM: CT ANGIOGRAPHY HEAD AND NECK TECHNIQUE: Multidetector CT imaging of the head and neck was performed using the standard protocol during bolus administration of intravenous contrast. Multiplanar CT image reconstructions and MIPs were obtained to evaluate the vascular anatomy. Carotid stenosis measurements (when applicable) are obtained utilizing NASCET criteria, using the distal internal carotid diameter as the denominator. CONTRAST:  51m OMNIPAQUE IOHEXOL 350 MG/ML SOLN COMPARISON:  CT head same day.  MRA neck 10/28/2015. FINDINGS: CTA NECK FINDINGS Aortic arch: Great vessel origins are patent. Right carotid system: No evidence of dissection, stenosis (50% or greater) or occlusion. Left carotid system: No evidence of dissection, stenosis (50% or greater) or occlusion. Vertebral arteries: Mildly left dominant. No evidence of dissection, stenosis (50% or greater) or occlusion. Skeleton: No evidence of acute abnormality on limited assessment. Other neck: No acute abnormality. Upper chest: The visualized lung apices are clear. Review of the MIP images confirms the above findings CTA HEAD FINDINGS Anterior circulation: Bilateral ICAs, MCAs, and ACAs are patent without proximal flow limiting stenosis. No aneurysm identified Posterior circulation: Bilateral intradural vertebral arteries, basilar artery, and posterior cerebral arteries are patent without evidence of flow limiting proximal stenosis. Prominent right posterior communicating artery with small right P1 PCA, anatomic variant. No aneurysm identified. Venous sinuses: As permitted by contrast timing, patent. Rounded arachnoid granulations in the superior sagittal sinus and distal transverse sinuses. Small left transverse and sigmoid sinuses. Anatomic variants: See above. Review of the MIP images confirms the above findings IMPRESSION: No large vessel occlusion or  proximal flow limiting stenosis in the head or neck. Electronically Signed   By: FMargaretha SheffieldMD   On: 08/13/2020 15:38   DG Chest 2 View  Result Date: 08/13/2020 CLINICAL DATA:  Chest pain EXAM: CHEST - 2 VIEW COMPARISON:  04/11/2020 FINDINGS: Right-sided central venous port tip over the cavoatrial region. No focal opacity or pleural effusion. Normal cardiomediastinal silhouette. No pneumothorax. IMPRESSION: No active cardiopulmonary disease. Electronically Signed   By: KDonavan FoilM.D.   On: 08/13/2020 15:23   CT HEAD WO CONTRAST  Result Date: 08/13/2020 CLINICAL DATA:  Neuro deficit, acute, stroke suspected. Additional history provided: Elevated blood pressure for the past week, left-sided weakness. EXAM: CT HEAD WITHOUT CONTRAST TECHNIQUE: Contiguous axial images were obtained from the base of the skull through the vertex without intravenous contrast. COMPARISON:  Brain MRI 04/11/2020. Prior head CT examinations 04/11/2020 and earlier. FINDINGS: Brain: Cerebral volume is normal. Moderate patchy and ill-defined hypoattenuation within the cerebral white matter,  nonspecific but most often secondary to chronic small vessel ischemia. There is no acute intracranial hemorrhage. No demarcated cortical infarct. No extra-axial fluid collection. No evidence of an intracranial mass. No midline shift. Vascular: No hyperdense vessel.  Atherosclerotic calcifications Skull: Normal. Negative for fracture or focal lesion. Sinuses/Orbits: Visualized orbits show no acute finding. No significant paranasal sinus disease at the imaged levels. IMPRESSION: No evidence of acute intracranial abnormality. Stable moderate chronic cerebral white matter disease, nonspecific but most often secondary to chronic small vessel ischemia. Electronically Signed   By: Kellie Simmering DO   On: 08/13/2020 12:43   MR BRAIN WO CONTRAST  Result Date: 08/13/2020 CLINICAL DATA:  Acute neuro deficit rule out stroke EXAM: MRI HEAD WITHOUT  CONTRAST TECHNIQUE: Multiplanar, multiecho pulse sequences of the brain and surrounding structures were obtained without intravenous contrast. COMPARISON:  CT head 08/13/2020 FINDINGS: Brain: Ventricle size and cerebral volume normal. Moderate white matter changes with scattered small subcortical and deep white matter hyperintensities bilaterally. Brainstem and cerebellum intact. Negative for acute infarct, hemorrhage, or mass lesion. Vascular: Normal arterial flow voids Skull and upper cervical spine: No focal skeletal abnormality. Sinuses/Orbits: Negative Other: None IMPRESSION: No acute abnormality. Moderate white matter changes most likely chronic microvascular ischemia. Electronically Signed   By: Franchot Gallo M.D.   On: 08/13/2020 19:35        Scheduled Meds:  amLODipine  5 mg Oral Daily   atorvastatin  40 mg Oral Daily   Chlorhexidine Gluconate Cloth  6 each Topical Daily   cholecalciferol  5,000 Units Oral Daily   enoxaparin (LOVENOX) injection  40 mg Subcutaneous Q24H   gabapentin  600 mg Oral TID   insulin pump   Subcutaneous TID WC, HS, 0200   lipase/protease/amylase  144,000 Units Oral TID AC   lisinopril  40 mg Oral Daily   metoprolol succinate  25 mg Oral Daily   pantoprazole  40 mg Oral Daily   Continuous Infusions:   LOS: 0 days    Time spent: over 30 min    Fayrene Helper, MD [facility]   To contact the attending provider between 7A-7P or the covering provider during after hours 7P-7A, please log into the web site [email or web address] and access using universal [place] password for that web site. If you do not have the password, please call the hospital operator.  08/14/2020, 5:43 PM

## 2020-08-14 NOTE — Progress Notes (Signed)
Patient not wearing CPAP, did not tolerate.

## 2020-08-14 NOTE — Progress Notes (Signed)
SLP Cancellation Note  Patient Details Name: Amber Christensen MRN: [id number] DOB: 08-Nov-1965   Cancelled treatment:       Reason Eval/Treat Not Completed: SLP screened, no needs identified, will sign off (chart reviewed; met w/ pt then consulted NSG). Pt denied any difficulty swallowing overll and is currently on a regular diet; tolerates swallowing pills w/ water and Puree(witnesses w/ NSG giving -- rec'd b/c pt c/o large Potassium pills being difficult to swallow sometimes). Pt did c/o tough meat being difficult to swallow this morning, but stated No deficits w/ Lunch meal including a Salad. Pt conversed un conversation w/out deficits noted; No dysfluency or word-finding deficits noted. Pt and NSG denied any speech-language deficits at this time. No further skilled ST services indicated as pt appears at her baseline. Pt agreed. NSG to reconsult if any change in status.       Orinda Kenner, MS, CCC-SLP Speech Language Pathologist Rehab Services [phone] [facility] 08/14/2020, 1:27 PM

## 2020-08-14 NOTE — Progress Notes (Addendum)
Inpatient Diabetes Program Recommendations  AACE/ADA: New Consensus Statement on Inpatient Glycemic Control (2015)  Target Ranges:  Prepandial:   less than 140 mg/dL      Peak postprandial:   less than 180 mg/dL (1-2 hours)      Critically ill patients:  140 - 180 mg/dL   Lab Results  Component Value Date   GLUCAP 220 (H) 08/14/2020   HGBA1C 8.0 (H) 04/11/2020    Review of Glycemic Control Results for Amber Christensen, Amber Christensen (MRN [id number]) as of 08/14/2020 10:36  Ref. Range 08/13/2020 17:16 08/13/2020 23:02 08/14/2020 07:23  Glucose-Capillary Latest Ref Range: 70 - 99 mg/dL 182 (H) 368 (H) 220 (H)   Diabetes history: DM2 Outpatient Diabetes medications: Medtronic Minimed 630 G insulin pump Current orders for Inpatient glycemic control: Novolog correction  Inpatient Diabetes Program Recommendations:   Secure chat with Dr. Florene Glen requesting entry of insulin pump orders and D/C of Novolog correction. Patient sees Dr. Gabriel Carina for endocrinology and last office visit was 06/08/20. "Her diabetes is managed with a Minimed Medtronic 630G pump and a DexCom G6 CGM. She also has a Contour Next glucometer. Her pump settings:  Basal settings 12 AM 0.6 units/hr 2 PM 0.8 units/hr 24-hr basal = 16.4 units  Bolus settings I:C ratio 12 Sensitivity 65 Target blood glucose 100-110"  Spoke with patient via phone (DM coordinator working @ [employer]). Patient has insulin pump in place and needs additional insulin to place in insulin pump but has supplies. Her husband will be coming to the hospital with additional insulin but needs additional insulin before time for husband to arrive. Contacted pharmacist for assistance to get insulin to fill patient's insulin pump.  Thank you, Nani Gasser. Kaaren Nass, RN, MSN, CDE  Diabetes Coordinator Inpatient Glycemic Control Team Team Pager [phone] (8am-5pm) 08/14/2020 10:47 AM

## 2020-08-14 NOTE — Progress Notes (Signed)
Admitted patient from the ED, transported via wheelchair. Pt is aox4, VSS, denies pain. Pt has an insulim pump on LLQ,insertion site CDI. Per pt insulin pump has lispro and she prefers to manage her own sugar. Dr. Sidney Ace notified, order placed per approval of Dr Sidney Ace.  Finger stick was 368. Pt bolus herself with 3.9 units of lispro. Call bell placed within reach. Fluids offered. Orientation given. Will monitor.

## 2020-08-14 NOTE — TOC Progression Note (Signed)
Transition of Care [facility]) - Progression Note    Patient Details  Name: Amber Christensen MRN: [id number] Date of Birth: [birth year]  Transition of Care [facility]) CM/SW [place], RN Phone Number: 08/14/2020, 2:08 PM  Clinical Narrative:   [facility] to provide PT for home health as per Joelene Millin, patient states she can get Home health nurse through insurance company and will contact them on discharge.  [employer] to provide DME, Rhonda notified.  Patient lives at home with spouse, but is concerned as he does not get home from work until 7pm, he helps her, but his time is limited due to work.  Spouse monitors her medications, as she states she forgets to take them at times.  TOC informed patient that Health insurance nurse can assist with this as well.  Patient has no concerns about getting to appointments or getting medications from the pharmacy, spouse helps with this as well.           Expected Discharge Plan and Services                                                 Social Determinants of Health (SDOH) Interventions    Readmission Risk Interventions No flowsheet data found.

## 2020-08-14 NOTE — Evaluation (Signed)
Physical Therapy Evaluation Patient Details Name: Amber Christensen MRN: [id number] DOB: August 27, 1965 Today's Date: 08/14/2020   History of Present Illness  Pt is a 55 y/o F admitted on 08/13/20 with c/c of L sided weakness, numbness, tingling & chest pain. Imaging was negative for acute processes. PMH: HTN, GERD, neuropathy, anxiety, bipolar disorder, cervical CA, chronic pancreatitis, cirrhosis, depression, dyspnea, stroke, thyroid disease   Clinical Impression  Pt seen for PT evaluation with pt demonstrating some LLE/LUE weakness but is able to complete sit<>stand transfers without AD, gait without AD & CGA & negotiate 3 steps without rails with min assist & HHA. Pt does use LUE/LLE well in a functional context but would benefit from ongoing skilled PT treatment to progress strengthening & neuromuscular control & high level balance & gait.     Follow Up Recommendations Home health PT;Supervision for mobility/OOB    Equipment Recommendations  Rolling walker with 5" wheels    Recommendations for Other Services       Precautions / Restrictions Precautions Precautions: Fall Restrictions Weight Bearing Restrictions: No      Mobility  Bed Mobility Overal bed mobility: Modified Independent             General bed mobility comments: not observed, pt received & left sitting up in recliner    Transfers Overall transfer level: Needs assistance Equipment used: None Transfers: Sit to/from Stand Sit to Stand: Supervision         General transfer comment: pt appears to be more cautious with movement but no assist required, no LOB  Ambulation/Gait Ambulation/Gait assistance: Min guard Gait Distance (Feet): 330 Feet Assistive device: None Gait Pattern/deviations: Decreased step length - left;Decreased dorsiflexion - left Gait velocity: decreased, shuffled gait initially but able to increase step length as task progressed   General Gait Details: decreased L hip flexion during  swing phase  Stairs Stairs: Yes Stairs assistance: Min assist Stair Management: No rails (HHA) Number of Stairs: 3 General stair comments: step to pattern with PT providing education re: compensatory pattern  Wheelchair Mobility    Modified Rankin (Stroke Patients Only)       Balance Overall balance assessment: Needs assistance Sitting-balance support: Feet supported Sitting balance-Leahy Scale: Good       Standing balance-Leahy Scale: Good                               Pertinent Vitals/Pain Pain Assessment: 0-10 Pain Score: 5  Pain Location: LLE>LUE Pain Descriptors / Indicators: Aching Pain Intervention(s): Monitored during session    Home Living Family/patient expects to be discharged to:: Private residence Living Arrangements: Spouse/significant other Available Help at Discharge: Family;Available PRN/intermittently (spouse works during the day) Type of Home: House Home Access: Stairs to enter Entrance Stairs-Rails: None Technical brewer of Steps: 3 Home Layout: One level Home Equipment: None      Prior Function Level of Independence: Independent         Comments: Indep, 1 fall in April while walking the dog, not working, enjoys watching the dog, cleaning the house     Hand Dominance   Dominant Hand: Right    Extremity/Trunk Assessment   Upper Extremity Assessment Upper Extremity Assessment:  (Rapid Alternating Movements slightly delayed LUE compared to RUE; pt uses LUE in functional context throghout session; pt reports numbness tingling in LUE) RUE Deficits / Details: at least 4+/5, coordination and sensation intact RUE Sensation: WNL RUE Coordination: WNL LUE Deficits /  Details: grossly 4/5 to 4+/5, give-away weakness noted; pt endorses decreased sensation to LUE, hx of bilateral hands/fingers neuropathy, endorses L shoulder pain that does not worsen with shoulder ROM LUE Sensation: decreased light touch LUE Coordination:  decreased fine motor    Lower Extremity Assessment Lower Extremity Assessment:  (pt reports tingling in LLE) RLE Deficits / Details: at least 4+/5, coordination and sensation intact RLE Sensation: history of peripheral neuropathy LLE Deficits / Details: 2/5 LLE dorsiflexion, able to complete heel to shin accurately but with weakness observed in hip flexors LLE Sensation: history of peripheral neuropathy    Cervical / Trunk Assessment Cervical / Trunk Assessment: Normal  Communication   Communication: No difficulties  Cognition Arousal/Alertness: Awake/alert Behavior During Therapy: WFL for tasks assessed/performed Overall Cognitive Status: Within Functional Limits for tasks assessed                                 General Comments: Pleasant, reports she's scared since she's so young & unsure of what is happening to her. Pt talkatiave throughout session about cruises & previous travel her & her husband have done.      General Comments      Exercises Other Exercises Other Exercises: Pt educated in [facility] exercises with handout provided   Assessment/Plan    PT Assessment Patient needs continued PT services  PT Problem List Decreased strength;Decreased mobility;Decreased activity tolerance;Decreased balance;Decreased knowledge of use of DME       PT Treatment Interventions DME instruction;Therapeutic exercise;Gait training;Balance training;Stair training;Neuromuscular re-education;Functional mobility training;Therapeutic activities;Patient/family education    PT Goals (Current goals can be found in the Care Plan section)  Acute Rehab PT Goals Patient Stated Goal: figure out what's going on with me PT Goal Formulation: With patient Time For Goal Achievement: 08/28/20 Potential to Achieve Goals: Good    Frequency Min 2X/week   Barriers to discharge Decreased caregiver support      Co-evaluation               AM-PAC PT "6 Clicks" Mobility  Outcome  Measure Help needed turning from your back to your side while in a flat bed without using bedrails?: None Help needed moving from lying on your back to sitting on the side of a flat bed without using bedrails?: A Little Help needed moving to and from a bed to a chair (including a wheelchair)?: A Little Help needed standing up from a chair using your arms (e.g., wheelchair or bedside chair)?: A Little Help needed to walk in hospital room?: A Little Help needed climbing 3-5 steps with a railing? : A Little 6 Click Score: 19    End of Session Equipment Utilized During Treatment: Gait belt Activity Tolerance: Patient tolerated treatment well Patient left: in chair;with call bell/phone within reach Nurse Communication: Mobility status PT Visit Diagnosis: Muscle weakness (generalized) (M62.81);Unsteadiness on feet (R26.81)    Time: 7893-8101 PT Time Calculation (min) (ACUTE ONLY): 16 min   Charges:   PT Evaluation $PT Eval Moderate Complexity: [place], PT, DPT 08/14/20, 1:26 PM   Waunita Schooner 08/14/2020, 1:24 PM

## 2020-08-14 NOTE — Evaluation (Signed)
Occupational Therapy Evaluation Patient Details Name: Amber Christensen MRN: [id number] DOB: Dec 05, 1965 Today's Date: 08/14/2020    History of Present Illness 55 y.o. female with PMHx significant for HTN, GERD, bipolar disorder, hx cervical cancer, CKD, Addison's disease, depression, DM (insulin pump), IBS, NASH, TIA (11/2017), neuropathy, and thyroid disease presents to the emergency department for chief concerns of left-sided weakness numbness and tingling and chest pain. Imaging negative for acute infarct.   Clinical Impression   Pt seen for OT evaluation this date. Prior to hospital admission, pt was independent in all aspects of ADL/IADL, however, had difficulty completing some crafting activities due to peripheral neuropathy. Pt endorses increased difficulty over past week or so with scrubbing dishes due to L side weakness and impaired sensation prior to coming to the hospital. Pt lives with her spouse in a 1 story home with 3 steps to enter. Her spouse works during the day. Pt alert and oriented, endorses feeling frustrated with her concerns regarding not feeling the doctors are taking her complaints seriously. Pt expresses fear that she is going to have a stroke. Pt performs bed mobility, transfers, and walks several feet from EOB to the recliner with MOD INDEP, no overt balance deficits or difficulty noted, but pt appears cautious with movements. She donned her socks seated EOB with modified independence, noting mild increased time/effort to don L sock. No difficulty with fine motor tasks such as self feeding, using remote for TV, or using cell phone. Pt endorses neuropathy at baseline, however feels her coordination and sensation impairments to L side are worse. Very mild L deficits noted with thumb opposition testing and RAM. Questionable give-away weakness in LUE but grossly overall at least 4/5 to 4+/5, RUE 4+/5 to 5/5. Pt endorses intermittent blurry vision from her diabetes at baseline as well,  no worsening or new visual changes. Pt educated in [facility] activities to perform at home, handout provided. Pt verbalized understanding. Pt appears near baseline independence for ADL and mobility. Pt endorses feeling her mobility is near baseline. Do not anticipate additional skilled OT needs at this time. Will sign off. Please re-consult if additional OT needs arise.    Follow Up Recommendations  No OT follow up    Equipment Recommendations  None recommended by OT    Recommendations for Other Services       Precautions / Restrictions Precautions Precautions: Fall Restrictions Weight Bearing Restrictions: No      Mobility Bed Mobility Overal bed mobility: Modified Independent                  Transfers Overall transfer level: Modified independent               General transfer comment: pt appears to be more cautious with movement but no assist required, no LOB    Balance Overall balance assessment: No apparent balance deficits (not formally assessed)                                         ADL either performed or assessed with clinical judgement   ADL Overall ADL's : Modified independent                                       General ADL Comments: Pt able to don socks seated EOB, slight increased  difficulty/time donning L sock but able to complete without assist, mod I for ADL transfers     Vision Baseline Vision/History: Wears glasses;Retinopathy Wears Glasses: Reading only Patient Visual Report: No change from baseline Vision Assessment?: No apparent visual deficits     Perception     Praxis      Pertinent Vitals/Pain Pain Assessment: 0-10 Pain Score: 2  Pain Location: LLE>LUE Pain Descriptors / Indicators: Aching Pain Intervention(s): Limited activity within patient's tolerance;Monitored during session     Hand Dominance Right   Extremity/Trunk Assessment Upper Extremity Assessment Upper Extremity Assessment:  RUE deficits/detail;LUE deficits/detail RUE Deficits / Details: at least 4+/5, coordination and sensation intact RUE Sensation: WNL RUE Coordination: WNL LUE Deficits / Details: grossly 4/5 to 4+/5, give-away weakness noted; pt endorses decreased sensation to LUE, hx of bilateral hands/fingers neuropathy, endorses L shoulder pain that does not worsen with shoulder ROM LUE Sensation: decreased light touch LUE Coordination: decreased fine motor   Lower Extremity Assessment Lower Extremity Assessment: LLE deficits/detail;RLE deficits/detail RLE Deficits / Details: at least 4+/5, coordination and sensation intact RLE Sensation: history of peripheral neuropathy LLE Deficits / Details: grossly 4/5 to 4+/5, slight give-away weakness noted; pt endorses decreased sensation to LUE, hx of foot neuropathy bilaterally LLE Sensation: history of peripheral neuropathy   Cervical / Trunk Assessment Cervical / Trunk Assessment: Normal   Communication Communication Communication: Other (comment) (very intermittent word finding difficulties)   Cognition Arousal/Alertness: Awake/alert Behavior During Therapy: WFL for tasks assessed/performed Overall Cognitive Status: Within Functional Limits for tasks assessed                                 General Comments: pt endorses feeling "scared" regarding not knowing what's happening to her, shares with therapist extensive medical history and feeling that doctors are not taking her complaints seriously   General Comments       Exercises Other Exercises Other Exercises: Pt educated in [facility] exercises with handout provided   Shoulder Instructions      Home Living Family/patient expects to be discharged to:: Private residence Living Arrangements: Spouse/significant other Available Help at Discharge: Family;Available PRN/intermittently (works during the day) Type of Home: House Home Access: Stairs to enter [employer] of Steps:  3 Entrance Stairs-Rails: None Home Layout: One level     Bathroom Shower/Tub: Teacher, early years/pre: Standard     Home Equipment: None          Prior Functioning/Environment Level of Independence: Independent        Comments: Indep, 1 fall in April while walking the dog, not working, enjoys watching the dog, cleaning the house        OT Problem List: Pain;Impaired UE functional use      OT Treatment/Interventions:      OT Goals(Current goals can be found in the care plan section) Acute Rehab OT Goals Patient Stated Goal: figure out what's going on with me OT Goal Formulation: All assessment and education complete, DC therapy  OT Frequency:     Barriers to D/C:            Co-evaluation              AM-PAC OT "6 Clicks" Daily Activity     Outcome Measure Help from another person eating meals?: None Help from another person taking care of personal grooming?: None Help from another person toileting, which includes using toliet, bedpan, or  urinal?: None Help from another person bathing (including washing, rinsing, drying)?: None Help from another person to put on and taking off regular upper body clothing?: None Help from another person to put on and taking off regular lower body clothing?: None 6 Click Score: 24   End of Session    Activity Tolerance: Patient tolerated treatment well Patient left: in chair;with call bell/phone within reach;with nursing/sitter in room  OT Visit Diagnosis: Other abnormalities of gait and mobility (R26.89);Pain Pain - Right/Left: Left Pain - part of body: Shoulder                Time: 1610-9604 OT Time Calculation (min): 30 min Charges:  OT General Charges $OT Visit: 1 Visit OT Evaluation $OT Eval Low Complexity: 1 Low OT Treatments $Therapeutic Activity: 8-22 mins  Hanley Hays, MPH, MS, OTR/L ascom [phone] 08/14/20, 11:24 AM

## 2020-08-15 DIAGNOSIS — R29818 Other symptoms and signs involving the nervous system: Secondary | ICD-10-CM

## 2020-08-15 DIAGNOSIS — R531 Weakness: Secondary | ICD-10-CM | POA: Diagnosis not present

## 2020-08-15 DIAGNOSIS — G459 Transient cerebral ischemic attack, unspecified: Secondary | ICD-10-CM | POA: Diagnosis not present

## 2020-08-15 DIAGNOSIS — F459 Somatoform disorder, unspecified: Secondary | ICD-10-CM | POA: Diagnosis not present

## 2020-08-15 LAB — CBC WITH DIFFERENTIAL/PLATELET
Abs Immature Granulocytes: 0.01 10*3/uL (ref 0.00–0.07)
Basophils Absolute: 0.1 10*3/uL (ref 0.0–0.1)
Basophils Relative: 1 %
Eosinophils Absolute: 0.3 10*3/uL (ref 0.0–0.5)
Eosinophils Relative: 4 %
HCT: 32.2 % — ABNORMAL LOW (ref 36.0–46.0)
Hemoglobin: 11.2 g/dL — ABNORMAL LOW (ref 12.0–15.0)
Immature Granulocytes: 0 %
Lymphocytes Relative: 42 %
Lymphs Abs: 2.9 10*3/uL (ref 0.7–4.0)
MCH: 30.4 pg (ref 26.0–34.0)
MCHC: 34.8 g/dL (ref 30.0–36.0)
MCV: 87.3 fL (ref 80.0–100.0)
Monocytes Absolute: 0.5 10*3/uL (ref 0.1–1.0)
Monocytes Relative: 7 %
Neutro Abs: 3.2 10*3/uL (ref 1.7–7.7)
Neutrophils Relative %: 46 %
Platelets: 197 10*3/uL (ref 150–400)
RBC: 3.69 MIL/uL — ABNORMAL LOW (ref 3.87–5.11)
RDW: 12.4 % (ref 11.5–15.5)
WBC: 7 10*3/uL (ref 4.0–10.5)
nRBC: 0 % (ref 0.0–0.2)

## 2020-08-15 LAB — GLUCOSE, CAPILLARY
Glucose-Capillary: 238 mg/dL — ABNORMAL HIGH (ref 70–99)
Glucose-Capillary: 155 mg/dL — ABNORMAL HIGH (ref 70–99)
Glucose-Capillary: 164 mg/dL — ABNORMAL HIGH (ref 70–99)

## 2020-08-15 LAB — COMPREHENSIVE METABOLIC PANEL
ALT: 35 U/L (ref 0–44)
AST: 25 U/L (ref 15–41)
Albumin: 3.2 g/dL — ABNORMAL LOW (ref 3.5–5.0)
Alkaline Phosphatase: 117 U/L (ref 38–126)
Anion gap: 7 (ref 5–15)
BUN: 16 mg/dL (ref 6–20)
CO2: 28 mmol/L (ref 22–32)
Calcium: 7.5 mg/dL — ABNORMAL LOW (ref 8.9–10.3)
Chloride: 104 mmol/L (ref 98–111)
Creatinine, Ser: 0.82 mg/dL (ref 0.44–1.00)
GFR, Estimated: >60 mL/min (ref >60)
Glucose, Bld: 249 mg/dL — ABNORMAL HIGH (ref 70–99)
Potassium: 4 mmol/L (ref 3.5–5.1)
Sodium: 139 mmol/L (ref 135–145)
Total Bilirubin: 2.9 mg/dL — ABNORMAL HIGH (ref 0.3–1.2)
Total Protein: 6 g/dL — ABNORMAL LOW (ref 6.5–8.1)

## 2020-08-15 LAB — HEMOGLOBIN A1C
Hgb A1c MFr Bld: 7.9 % — ABNORMAL HIGH (ref 4.8–5.6)
Mean Plasma Glucose: 180 mg/dL

## 2020-08-15 LAB — MAGNESIUM: Magnesium: 2 mg/dL (ref 1.7–2.4)

## 2020-08-15 LAB — VITAMIN B12: Vitamin B-12: 268 pg/mL (ref 180–914)

## 2020-08-15 LAB — PHOSPHORUS: Phosphorus: 3.5 mg/dL (ref 2.5–4.6)

## 2020-08-15 MED ORDER — HEPARIN SOD (PORK) LOCK FLUSH 100 UNIT/ML IV SOLN
INTRAVENOUS | Status: AC
  Filled 2020-08-15: qty 5

## 2020-08-15 MED ORDER — ASPIRIN EC 81 MG PO TBEC: 81.0000 mg | DELAYED_RELEASE_TABLET | Freq: Every day | ORAL | 1 refills | Status: AC

## 2020-08-15 MED ORDER — ATORVASTATIN CALCIUM 40 MG PO TABS: 40.0000 mg | ORAL_TABLET | Freq: Every day | ORAL | 0 refills | Status: DC

## 2020-08-15 NOTE — Plan of Care (Signed)
End of Shift Summary:  Alert and oriented x4. VSS on room air. C/o dyspnea when ambulating to and from bathroom, briefly placed on 2L O2 for comfort. Educated on use of incentive spirometer with return demonstration. Endorses "some" pain,  but does not want medication. mNIHSS q4h - continues to endorse numbness/tingling in LUE and sometimes LLE. Blood glucose managed with patient's insulin pump.  Denies n/v. Remained free from falls or injury. Call bell within reach and able to use.   Problem: Education: Goal: Knowledge of disease or condition will improve Outcome: Progressing Goal: Knowledge of secondary prevention will improve Outcome: Progressing Goal: Knowledge of patient specific risk factors addressed and post discharge goals established will improve Outcome: Progressing  Problem: Health Behavior/Discharge Planning: Goal: Ability to manage health-related needs will improve Outcome: Progressing   Problem: Ischemic Stroke/TIA Tissue Perfusion: Goal: Complications of ischemic stroke/TIA will be minimized Outcome: Progressing   Problem: Pain Managment: Goal: General experience of comfort will improve Outcome: Progressing   Problem: Safety: Goal: Ability to remain free from injury will improve Outcome: Progressing   Problem: Activity: Goal: Risk for activity intolerance will decrease Outcome: Progressing

## 2020-08-15 NOTE — Consult Note (Signed)
NEURO HOSPITALIST CONSULT NOTE   Requestig physician: Dr. Florene Glen  Reason for Consult: Left sided numbness and weakness  History obtained from:  Patient and Chart     HPI:                                                                                                                                          Amber Christensen is an 55 y.o. female with a PMHx of HTN, multiple allergies, anxiety, bipolar disorder, left carpal tunnel syndrome, cervical cancer, CKD, cirrhosis, DM with insulin pump, esophagitis, GERD, migraines, HTN, IBS, left kidney mass, NASH, TIA ("no longer needs Plavix" per documentation) and thyroid disease who presented to the ED on 6/30 with symptoms of left sided weakness, sensory numbness and paresthesias in conjunction with intermittent chest pain. She reported elevated blood pressures at home on the day of presentation. In the ER her BP was 159/90 with HR of 107, normal respiratory rate and SPO2 of 90% on RA. Temperature was 99. CT head in the ED was negative for acute abnormality. CTA of head and neck was negative for LVO. MRI brain was then obtained, which was also negative for acute abnormality, but did reveal moderate chronic small vessel ischemic changes. Troponin was negative x 2. Her neurological and cardiac presenting symptoms were suspected to be psychosomatic. On Friday, she continued to endorse LUE numbness and tingling. Hospitalist exam revealed maximum elicitable strength of 4/5 to LUE and LLE. Neurology was consulted to further evaluate. She does follow as an outpatient with Neurology. She recently had an EEG on 07/05/20 which was negative for focality or epileptiform discharge.  Today, she states that she has left sided weakness with some numbness. The weakness is worse in her arm.   Past Medical History:  Diagnosis Date   Allergy    many many allergies   Anemia    in past   Anginal pain ([place]) 01/2018   cardiac workup clear   Anxiety     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[place])    pt unaware of this diagnosis   Bipolar disorder ([place])    Bladder mass    removed and had tbt. mesh removed   Blood transfusion without reported diagnosis 1999   received 20 units of blood after tearing esophagus from vomitting so severely after ERCP   Breast mass    left side biopsy was negative   Carpal tunnel syndrome    left hand   Cervical cancer ([place]) 2002   Chronic kidney disease    cyst on left kidney   Chronic pancreatitis ([place])    prior to pancreas surgery   Cirrhosis ([place])    Clotting disorder ([place])    pt states that sometimes she has difficulty stopping to bleed and other times it  is okay   Depression    Diabetes mellitus without complication (HCC)    has insulin pump   Dyspnea    Esophagitis    GERD (gastroesophageal reflux disease)    Headache    migraines...takes compazine for this   Heart disease    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 left kidney mass.   Liver disease    NASH. has had liver biopsies. negative except for NASH   NASH (nonalcoholic steatohepatitis)    NASH (nonalcoholic steatohepatitis)    Pancreatitis    PONV (postoperative nausea and vomiting)    Stroke ([place]) 11/2017   had tia. no longer needs plavix   Thyroid disease     Past Surgical History:  Procedure Laterality Date   ABDOMINAL HYSTERECTOMY     APPENDECTOMY     BLADDER SURGERY     mesh removed. revision which has caused everything to fall again   BOWEL RESECTION     took a piece of bowel out after pancreatectomy caused problem with bowel.    BREAST BIOPSY Left 1989   benign   CARDIAC CATHETERIZATION  7026,3785, 1998   no stents, fatty streaks   CHOLECYSTECTOMY     COLON SURGERY     COLONOSCOPY WITH PROPOFOL N/A 10/08/2018   Procedure: COLONOSCOPY WITH PROPOFOL;  Surgeon: Lollie Sails, MD;   Location: [facility] ENDOSCOPY;  Service: Endoscopy;  Laterality: N/A;   CYST REMOVAL HAND Left 1992   Ganglion cyst removed from Left Wrist   CYSTECTOMY     CYSTO WITH HYDRODISTENSION N/A 03/04/2019   Procedure: CYSTOSCOPY/HYDRODISTENSION;  Surgeon: Bjorn Loser, MD;  Location: [facility] ORS;  Service: Urology;  Laterality: N/A;   DILATATION & CURETTAGE/HYSTEROSCOPY WITH MYOSURE     DILATION AND CURETTAGE OF UTERUS     ERCP     ESOPHAGOGASTRODUODENOSCOPY     HERNIA REPAIR     KNEE ARTHROSCOPY Left 1992   LEFT HEART CATH AND CORONARY ANGIOGRAPHY Left 02/06/2019   Procedure: LEFT HEART CATH AND CORONARY ANGIOGRAPHY;  Surgeon: Yolonda Kida, MD;  Location: [place] CV LAB;  Service: Cardiovascular;  Laterality: Left;   LIVER BIOPSY     PANCREAS SURGERY  2006   partial pancreatectomy after endoscope knicked a part of pancreas.    PORTA CATH INSERTION N/A 04/10/2017   Procedure: PORTA CATH INSERTION;  Surgeon: Algernon Huxley, MD;  Location: [place] CV LAB;  Service: Cardiovascular;  Laterality: N/A;   PORTA CATH INSERTION N/A 02/21/2019   Procedure: PORTA CATH INSERTION;  Surgeon: Algernon Huxley, MD;  Location: [place] CV LAB;  Service: Cardiovascular;  Laterality: N/A;   REPAIR OF ESOPHAGUS  2012   3 clamps placed in esophagus   ROBOTIC ASSISTED LAPAROSCOPIC VENTRAL/INCISIONAL HERNIA REPAIR N/A 03/15/2018   Procedure: ROBOTIC ASSISTED LAPAROSCOPIC VENTRAL HERNIA REPAIR;  Surgeon: Jules Husbands, MD;  Location: [facility] ORS;  Service: General;  Laterality: N/A;   SMALL BOWEL REPAIR     TONSILLECTOMY      Family History  Problem Relation Age of Onset   Cirrhosis Mother    Colon cancer Mother 46       TAH/BSO in ~29; deceased at 48   Hypertension Mother    Breast cancer Mother 35       unconfirmed   Hypertension Father  Prostate cancer Father 31       currently 55   Other Father        liver disease   Breast cancer Maternal Aunt        age at dx unk.; currently 65    Cervical cancer Maternal Aunt    Cancer Maternal Uncle        unk. type; currently 19s   Stomach cancer Paternal Aunt 7       deceased at 28   Breast cancer Maternal Grandmother 12       possibly bilateral in 94s; deceased 73   Non-Hodgkin's lymphoma Daughter        unconfirmed; currently 49   Cancer Maternal Aunt        hematologic malignancy; deceased 54             Social History:  reports that she has never smoked. She has never used smokeless tobacco. She reports previous alcohol use. She reports that she does not use drugs.  Allergies  Allergen Reactions   Ciprofloxacin     Rash, nausea vomitting   Demerol [Meperidine] Hives and Other (See Comments)    Pt states that this medication causes cardiac arrest.     Dilaudid [Hydromorphone Hcl] Nausea And Vomiting and Other (See Comments)    Pt states that this medication causes cardiac arrest.     Metoclopramide Hives    Breathing issues   Morphine And Related Anaphylaxis and Other (See Comments)    Pt states that this medication causes cardiac arrest   Morpholine Salicylate Nausea And Vomiting, Other (See Comments) and Rash    CHF   Isosorbide Nitrate Other (See Comments)    Headache    Sulfa Antibiotics Nausea And Vomiting   Flagyl [Metronidazole]     Rash, heart racing, nausea vomitting   Adhesive [Tape] Rash    Blisters skin Paper tape is okay   Darvon [Propoxyphene] Nausea And Vomiting and Rash   Flexeril [Cyclobenzaprine] Nausea And Vomiting and Rash   Levaquin [Levofloxacin In D5w] Nausea And Vomiting and Rash   Naproxen Rash    Ibuprofen and advil are not a problem   Soma [Carisoprodol] Nausea And Vomiting and Rash   Zofran [Ondansetron Hcl] Nausea And Vomiting and Rash    MEDICATIONS:                                                                                                                     Prior to Admission:  Medications Prior to Admission  Medication Sig Dispense Refill Last Dose   albuterol  (VENTOLIN HFA) 108 (90 Base) MCG/ACT inhaler Inhale into the lungs.   Past Month   amLODipine (NORVASC) 5 MG tablet Take 5 mg by mouth daily.   08/13/2020   Calcium Carb-Cholecalciferol (CALCIUM 600/VITAMIN D3 PO) Take 1 tablet by mouth daily.   08/13/2020   Cholecalciferol (VITAMIN D-3) 5000 units TABS Take 5,000 Units by mouth daily.   08/13/2020   cyanocobalamin 1000 MCG tablet  Take by mouth.   08/13/2020   estradiol (ESTRACE) 0.1 MG/GM vaginal cream Place vaginally.   Past Week   gabapentin (NEURONTIN) 300 MG capsule Take 600 mg by mouth 3 (three) times daily.   08/13/2020   hydrochlorothiazide (HYDRODIURIL) 25 MG tablet Take by mouth.   08/13/2020   insulin lispro (HUMALOG) 100 UNIT/ML injection USE UP TO 70 UNITS SUBCUTANEOUSLY PER DAY VIA INSULIN PUMP. Dx code e10.65      LACTOBACILLUS PO Take by mouth.   08/13/2020   lipase/protease/amylase (CREON) 36000 UNITS CPEP capsule Take 144,000 Units by mouth in the morning, at noon, in the evening, and at bedtime.   08/13/2020   lisinopril (ZESTRIL) 40 MG tablet Take 40 mg by mouth daily.   08/13/2020   Magnesium 250 MG TABS Take 250 mg by mouth daily.    Past Month   metoprolol succinate (TOPROL-XL) 25 MG 24 hr tablet Take 25 mg by mouth daily.   08/13/2020   nitroGLYCERIN (NITROSTAT) 0.3 MG SL tablet Place 0.3 mg under the tongue every 5 (five) minutes as needed for chest pain.   08/12/2020   vitamin E 400 UNIT capsule Take 400 Units by mouth daily.   08/13/2020   amitriptyline (ELAVIL) 25 MG tablet Take 25-50 mg by mouth at bedtime. (Patient not taking: Reported on 08/13/2020)   Not Taking   Continuous Blood Gluc Receiver (DEXCOM G6 RECEIVER) DEVI Use to monitor blood sugar. [facility] [id number]      CONTOUR NEXT TEST test strip 1 each 4 (four) times daily.      ELMIRON 100 MG capsule Take 100 mg by mouth 3 (three) times daily. (Patient not taking: Reported on 08/13/2020)   Not Taking   EPINEPHrine 0.3 mg/0.3 mL IJ SOAJ injection SMARTSIG:1 Pre-Filled Pen  Syringe IM PRN   prn at prn   MITIGARE 0.6 MG CAPS Take 1 capsule by mouth 2 (two) times daily. (Patient not taking: Reported on 08/13/2020)   Not Taking   oxyCODONE-acetaminophen (PERCOCET) 5-325 MG tablet Take 1 tablet by mouth every 8 (eight) hours as needed. (Patient not taking: Reported on 08/13/2020) 20 tablet 0 Not Taking   pantoprazole (PROTONIX) 40 MG tablet Take 1 tablet (40 mg total) by mouth daily. 30 tablet 1    potassium chloride (KLOR-CON) 10 MEQ tablet Take 1 tablet (10 mEq total) by mouth 2 (two) times daily for 5 days. 10 tablet 0    Scheduled:  amLODipine  5 mg Oral Daily   atorvastatin  40 mg Oral Daily   Chlorhexidine Gluconate Cloth  6 each Topical Daily   cholecalciferol  5,000 Units Oral Daily   enoxaparin (LOVENOX) injection  40 mg Subcutaneous Q24H   gabapentin  600 mg Oral TID   insulin pump   Subcutaneous TID WC, HS, 0200   lipase/protease/amylase  144,000 Units Oral TID AC   lisinopril  40 mg Oral Daily   metoprolol succinate  25 mg Oral Daily   pantoprazole  40 mg Oral Daily   cyanocobalamin  1,000 mcg Oral Daily     ROS:  As per HPI.    Blood pressure 130/80, pulse 74, temperature 97.7 F (36.5 C), resp. rate 16, height 5' 4"  (1.626 m), weight 75.6 kg, SpO2 98 %.   General Examination:                                                                                                       Physical Exam  HEENT-  [place]/AT   Lungs- Respirations unlabored Extremities- No edema  Neurological Examination Mental Status: Alert, oriented x 5. Speech is fluent with intact naming and comprehension. No dysarthria. Able to follow all commands without difficulty. Cranial Nerves: II: Temporal visual fields intact with no extinction to DSS. PERRL.   III,IV, VI: No ptosis. EOMI. No nystagmus.  V,VII: Temp sensation subjectively decreased  on the left. Face is symmetric.  VIII: Hearing intact to voice IX,X: No hypophonia XI: Head is midline XII: Midline tongue extension Motor: RUE and RLE 5/5 without giveway when tested individually.  LUE and LLE with giveway weakness, max strength elicitable is 4+/5 with coaching prior to episodes of giveway.  When testing both arms simultaneously and varying opposing force rapidly, the patient exhibits giveway on BOTH sides and intermittent max elicitable strength on the left increases.  Sensory: Subjectively decreased temp and FT sensation on the left. No extinction to DSS.  Deep Tendon Reflexes: 2+ and symmetric throughout Plantars: Right: downgoing   Left: downgoing Cerebellar: No ataxia with FNF bilaterally. Slower on the left.  Gait: Some wavering when ambulating, but not unsteady.    Lab Results: Basic Metabolic Panel: Recent Labs  Lab 08/13/20 1359 08/14/20 0600 08/14/20 0930 08/15/20 0150  NA 138  --  138 139  K 3.2*  --  4.0 4.0  CL 99  --  103 104  CO2 29  --  29 28  GLUCOSE 209*  --  294* 249*  BUN 12  --  11 16  CREATININE 0.78  --  0.75 0.82  CALCIUM 8.1*  --  8.1* 7.5*  MG  --  1.5*  --  2.0  PHOS  --   --   --  3.5    CBC: Recent Labs  Lab 08/13/20 1359 08/14/20 0800 08/15/20 0150  WBC 8.7 6.0 7.0  NEUTROABS  --  3.4 3.2  HGB 13.2 12.3 11.2*  HCT 36.1 34.4* 32.2*  MCV 83.2 85.8 87.3  PLT 219 190 197    Cardiac Enzymes: No results for input(s): CKTOTAL, CKMB, CKMBINDEX, TROPONINI in the last 168 hours.  Lipid Panel: Recent Labs  Lab 08/14/20 0600  CHOL 119  TRIG 64  HDL 32*  CHOLHDL 3.7  VLDL 13  LDLCALC 74    Imaging: CT ANGIO HEAD NECK W WO CM  Result Date: 08/13/2020 CLINICAL DATA:  Neuro deficit, acute stroke suspected. Left-sided weakness. EXAM: CT ANGIOGRAPHY HEAD AND NECK TECHNIQUE: Multidetector CT imaging of the head and neck was performed using the standard protocol during bolus administration of intravenous contrast.  Multiplanar CT image reconstructions and MIPs were obtained to evaluate the vascular anatomy. Carotid stenosis measurements (when  applicable) are obtained utilizing NASCET criteria, using the distal internal carotid diameter as the denominator. CONTRAST:  67m OMNIPAQUE IOHEXOL 350 MG/ML SOLN COMPARISON:  CT head same day.  MRA neck 10/28/2015. FINDINGS: CTA NECK FINDINGS Aortic arch: Great vessel origins are patent. Right carotid system: No evidence of dissection, stenosis (50% or greater) or occlusion. Left carotid system: No evidence of dissection, stenosis (50% or greater) or occlusion. Vertebral arteries: Mildly left dominant. No evidence of dissection, stenosis (50% or greater) or occlusion. Skeleton: No evidence of acute abnormality on limited assessment. Other neck: No acute abnormality. Upper chest: The visualized lung apices are clear. Review of the MIP images confirms the above findings CTA HEAD FINDINGS Anterior circulation: Bilateral ICAs, MCAs, and ACAs are patent without proximal flow limiting stenosis. No aneurysm identified Posterior circulation: Bilateral intradural vertebral arteries, basilar artery, and posterior cerebral arteries are patent without evidence of flow limiting proximal stenosis. Prominent right posterior communicating artery with small right P1 PCA, anatomic variant. No aneurysm identified. Venous sinuses: As permitted by contrast timing, patent. Rounded arachnoid granulations in the superior sagittal sinus and distal transverse sinuses. Small left transverse and sigmoid sinuses. Anatomic variants: See above. Review of the MIP images confirms the above findings IMPRESSION: No large vessel occlusion or proximal flow limiting stenosis in the head or neck. Electronically Signed   By: FMargaretha SheffieldMD   On: 08/13/2020 15:38   DG Chest 2 View  Result Date: 08/13/2020 CLINICAL DATA:  Chest pain EXAM: CHEST - 2 VIEW COMPARISON:  04/11/2020 FINDINGS: Right-sided central venous port  tip over the cavoatrial region. No focal opacity or pleural effusion. Normal cardiomediastinal silhouette. No pneumothorax. IMPRESSION: No active cardiopulmonary disease. Electronically Signed   By: KDonavan FoilM.D.   On: 08/13/2020 15:23   CT HEAD WO CONTRAST  Result Date: 08/13/2020 CLINICAL DATA:  Neuro deficit, acute, stroke suspected. Additional history provided: Elevated blood pressure for the past week, left-sided weakness. EXAM: CT HEAD WITHOUT CONTRAST TECHNIQUE: Contiguous axial images were obtained from the base of the skull through the vertex without intravenous contrast. COMPARISON:  Brain MRI 04/11/2020. Prior head CT examinations 04/11/2020 and earlier. FINDINGS: Brain: Cerebral volume is normal. Moderate patchy and ill-defined hypoattenuation within the cerebral white matter, nonspecific but most often secondary to chronic small vessel ischemia. There is no acute intracranial hemorrhage. No demarcated cortical infarct. No extra-axial fluid collection. No evidence of an intracranial mass. No midline shift. Vascular: No hyperdense vessel.  Atherosclerotic calcifications Skull: Normal. Negative for fracture or focal lesion. Sinuses/Orbits: Visualized orbits show no acute finding. No significant paranasal sinus disease at the imaged levels. IMPRESSION: No evidence of acute intracranial abnormality. Stable moderate chronic cerebral white matter disease, nonspecific but most often secondary to chronic small vessel ischemia. Electronically Signed   By: KKellie SimmeringDO   On: 08/13/2020 12:43   MR BRAIN WO CONTRAST  Result Date: 08/13/2020 CLINICAL DATA:  Acute neuro deficit rule out stroke EXAM: MRI HEAD WITHOUT CONTRAST TECHNIQUE: Multiplanar, multiecho pulse sequences of the brain and surrounding structures were obtained without intravenous contrast. COMPARISON:  CT head 08/13/2020 FINDINGS: Brain: Ventricle size and cerebral volume normal. Moderate white matter changes with scattered small  subcortical and deep white matter hyperintensities bilaterally. Brainstem and cerebellum intact. Negative for acute infarct, hemorrhage, or mass lesion. Vascular: Normal arterial flow voids Skull and upper cervical spine: No focal skeletal abnormality. Sinuses/Orbits: Negative Other: None IMPRESSION: No acute abnormality. Moderate white matter changes most likely chronic microvascular ischemia. Electronically Signed  By: Franchot Gallo M.D.   On: 08/13/2020 19:35     Assessment: 55 year old female presenting with symptoms of left sided weakness, sensory numbness and paresthesias in conjunction with intermittent chest pain.  1. Exam reveals findings that are most consistent with a functional/nonorganic etiology for the patient's subjective weakness and sensory disturbance.  2. MRI brain: No acute abnormality. Moderate white matter changes most likely chronic microvascular ischemia. 3. CTA of head and neck: No large vessel occlusion or proximal flow limiting stenosis in the head or neck.  Recommendations: 1. Discussed results of tests with the patient in conjunction with clinical impression above. Recommended psychotherapy, but the patient states that in the past this has made her suicidal due to evoking of traumatic childhood memories. No further testin indicated during this admission from a neurological standpoint.  2. Management of patient's HTN per primary team.  3. Continue gabapentin for her neuropathy.  4. Close outpatient follow up by Neurology and her Primary Care Physician.   Electronically signed: Dr. Kerney Elbe 08/15/2020, 8:34 AM
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
## 2020-08-18 LAB — HEMOGLOBIN A1C
Hgb A1c MFr Bld: 7.8 % — ABNORMAL HIGH (ref 4.8–5.6)
Mean Plasma Glucose: 177 mg/dL

## 2020-09-02 ENCOUNTER — Encounter: Payer: Self-pay | Admitting: Surgery

## 2020-09-02 ENCOUNTER — Ambulatory Visit (INDEPENDENT_AMBULATORY_CARE_PROVIDER_SITE_OTHER): Payer: Medicare Other | Admitting: Surgery

## 2020-09-02 ENCOUNTER — Other Ambulatory Visit: Payer: Self-pay

## 2020-09-02 VITALS — BP 150/85 | HR 94 | Temp 98.9°F | Ht 64.0 in | Wt 162.0 lb

## 2020-09-02 DIAGNOSIS — R1084 Generalized abdominal pain: Secondary | ICD-10-CM | POA: Diagnosis not present

## 2020-09-02 NOTE — Patient Instructions (Addendum)
We will get you scheduled for a CT scan to have updated imaging of the area.   You are scheduled for a CT at [facility], [place] entrance on 09/17/20. You will need to arrive there by 7:45 am and have nothing to eat or drink for 4 hours prior. You will need to pick up a prep kit for this scan.   You may also try increasing your Gabapentin dosage to see if it helps.   We will have you follow up here after we get the results of this scan.  See your appointment below.

## 2020-09-03 ENCOUNTER — Encounter: Payer: Self-pay | Admitting: Surgery

## 2020-09-03 NOTE — Progress Notes (Signed)
Outpatient Surgical Follow Up  09/03/2020  Amber Christensen is an 55 y.o. female.   No chief complaint on file.   HPI: Amber Christensen is a 55 year old female well-known to me with prior history of recurrent ventral hernia.  I did perform a robotic repair with ventralight  mesh 02/2018. She reports chronic abdominal wall pain. Moderate, intermittent. She does have hypersensitivity to light touch. CT pers reviewed from 2021 no hernia recurrence , fistulas . She does have functional GI issue including Fecal and urinary incontinence. No clear cut etiology, recent stroke.   Past Medical History:  Diagnosis Date   Allergy    many many allergies   Anemia    in past   Anginal pain ([place]) 01/2018   cardiac workup clear   Anxiety    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[place])    pt unaware of this diagnosis   Bipolar disorder ([place])    Bladder mass    removed and had tbt. mesh removed   Blood transfusion without reported diagnosis 1999   received 20 units of blood after tearing esophagus from vomitting so severely after ERCP   Breast mass    left side biopsy was negative   Carpal tunnel syndrome    left hand   Cervical cancer ([place]) 2002   Chronic kidney disease    cyst on left kidney   Chronic pancreatitis ([place])    prior to pancreas surgery   Cirrhosis ([place])    Clotting disorder ([place])    pt states that sometimes she has difficulty stopping to bleed and other times it is okay   Depression    Diabetes mellitus without complication (HCC)    has insulin pump   Dyspnea    Esophagitis    GERD (gastroesophageal reflux disease)    Headache    migraines...takes compazine for this   Heart disease    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 left kidney mass.   Liver disease    NASH. has had liver biopsies. negative except for NASH   NASH (nonalcoholic  steatohepatitis)    NASH (nonalcoholic steatohepatitis)    Pancreatitis    PONV (postoperative nausea and vomiting)    Stroke ([place]) 11/2017   had tia. no longer needs plavix   Thyroid disease     Past Surgical History:  Procedure Laterality Date   ABDOMINAL HYSTERECTOMY     APPENDECTOMY     BLADDER SURGERY     mesh removed. revision which has caused everything to fall again   BOWEL RESECTION     took a piece of bowel out after pancreatectomy caused problem with bowel.    BREAST BIOPSY Left 1989   benign   CARDIAC CATHETERIZATION  9470,9628, 1998   no stents, fatty streaks   CHOLECYSTECTOMY     COLON SURGERY     COLONOSCOPY WITH PROPOFOL N/A 10/08/2018   Procedure: COLONOSCOPY WITH PROPOFOL;  Surgeon: Lollie Sails, MD;  Location[facility] ENDOSCOPY;  Service: Endoscopy;  Laterality: N/A;   CYST REMOVAL HAND Left 1992   Ganglion cyst removed from Left Wrist   CYSTECTOMY     CYSTO WITH HYDRODISTENSION N/A 03/04/2019   Procedure: CYSTOSCOPY/HYDRODISTENSION;  Surgeon: Bjorn Loser, MD;  Location: [facility] ORS;  Service: Urology;  Laterality: N/A;   DILATATION & CURETTAGE/HYSTEROSCOPY WITH MYOSURE  DILATION AND CURETTAGE OF UTERUS     ERCP     ESOPHAGOGASTRODUODENOSCOPY     HERNIA REPAIR     KNEE ARTHROSCOPY Left 1992   LEFT HEART CATH AND CORONARY ANGIOGRAPHY Left 02/06/2019   Procedure: LEFT HEART CATH AND CORONARY ANGIOGRAPHY;  Surgeon: Yolonda Kida, MD;  Location: [place] CV LAB;  Service: Cardiovascular;  Laterality: Left;   LIVER BIOPSY     PANCREAS SURGERY  2006   partial pancreatectomy after endoscope knicked a part of pancreas.    PORTA CATH INSERTION N/A 04/10/2017   Procedure: PORTA CATH INSERTION;  Surgeon: Algernon Huxley, MD;  Location: [place] CV LAB;  Service: Cardiovascular;  Laterality: N/A;   PORTA CATH INSERTION N/A 02/21/2019   Procedure: PORTA CATH INSERTION;  Surgeon: Algernon Huxley, MD;  Location: [place] CV LAB;  Service:  Cardiovascular;  Laterality: N/A;   REPAIR OF ESOPHAGUS  2012   3 clamps placed in esophagus   ROBOTIC ASSISTED LAPAROSCOPIC VENTRAL/INCISIONAL HERNIA REPAIR N/A 03/15/2018   Procedure: ROBOTIC ASSISTED LAPAROSCOPIC VENTRAL HERNIA REPAIR;  Surgeon: Jules Husbands, MD;  Location: [facility] ORS;  Service: General;  Laterality: N/A;   SMALL BOWEL REPAIR     TONSILLECTOMY      Family History  Problem Relation Age of Onset   Cirrhosis Mother    Colon cancer Mother 54       TAH/BSO in ~57; deceased at 30   Hypertension Mother    Breast cancer Mother 57       unconfirmed   Hypertension Father    Prostate cancer Father 19       currently 70   Other Father        liver disease   Breast cancer Maternal Aunt        age at dx unk.; currently 6   Cervical cancer Maternal Aunt    Cancer Maternal Uncle        unk. type; currently 2s   Stomach cancer Paternal Aunt 29       deceased at 9   Breast cancer Maternal Grandmother 56       possibly bilateral in 16s; deceased 22   Non-Hodgkin's lymphoma Daughter        unconfirmed; currently 53   Cancer Maternal Aunt        hematologic malignancy; deceased 69    Social History:  reports that she has never smoked. She has never used smokeless tobacco. She reports previous alcohol use. She reports that she does not use drugs.  Allergies:  Allergies  Allergen Reactions   Ciprofloxacin     Rash, nausea vomitting   Demerol [Meperidine] Hives and Other (See Comments)    Pt states that this medication causes cardiac arrest.     Dilaudid [Hydromorphone Hcl] Nausea And Vomiting and Other (See Comments)    Pt states that this medication causes cardiac arrest.     Metoclopramide Hives    Breathing issues   Morphine And Related Anaphylaxis and Other (See Comments)    Pt states that this medication causes cardiac arrest   Morpholine Salicylate Nausea And Vomiting, Other (See Comments) and Rash    CHF   Isosorbide Nitrate Other (See Comments)     Headache    Sulfa Antibiotics Nausea And Vomiting   Flagyl [Metronidazole]     Rash, heart racing, nausea vomitting   Adhesive [Tape] Rash    Blisters skin Paper tape is okay   Darvon [Propoxyphene] Nausea And  Vomiting and Rash   Flexeril [Cyclobenzaprine] Nausea And Vomiting and Rash   Levaquin [Levofloxacin In D5w] Nausea And Vomiting and Rash   Naproxen Rash    Ibuprofen and advil are not a problem   Soma [Carisoprodol] Nausea And Vomiting and Rash   Zofran [Ondansetron Hcl] Nausea And Vomiting and Rash    Medications reviewed.    ROS Full ROS performed and is otherwise negative other than what is stated in HPI   BP (!) 150/85   Pulse 94   Temp 98.9 F (37.2 C)   Ht 5' 4"  (1.626 m)   Wt 162 lb (73.5 kg)   SpO2 96%   BMI 27.81 kg/m   Physical Exam Vitals and nursing note reviewed. Exam conducted with a chaperone present.  Constitutional:      Appearance: Normal appearance. She is normal weight.  Pulmonary:     Effort: Pulmonary effort is normal. No respiratory distress.     Breath sounds: No stridor.  Abdominal:     General: Abdomen is flat. There is no distension.     Palpations: Abdomen is soft. There is no mass.     Tenderness: There is abdominal tenderness. There is no right CVA tenderness, left CVA tenderness, guarding or rebound.     Hernia: No hernia is present.     Comments: Mild diffuse tenderness, no peritonitis, no evidence of infection or hernia recurrence   Musculoskeletal:        General: No swelling or tenderness. Normal range of motion.     Cervical back: Normal range of motion and neck supple. No rigidity or tenderness.  Skin:    General: Skin is warm and dry.     Capillary Refill: Capillary refill takes less than 2 seconds.  Neurological:     General: No focal deficit present.     Mental Status: She is alert and oriented to person, place, and time.  Psychiatric:        Mood and Affect: Mood normal.        Behavior: Behavior normal.         Thought Content: Thought content normal.        Judgment: Judgment normal.       Assessment/Plan: 55 year old female with chronic abdominal pain prior history of robotic repair of ventral hernia.  She does have multiple GI issues as well as medical issues to include recent stroke, coronary artery disease unction of GI issues.  I have personally reviewed the last CT and there is no evidence of mesh erosion or bowel injuries or recurrence of the hernia.  Might be some chronic pain related to multiple abdominal operations.  At this point I am going to repeat a CT scan of the abdomen and pelvis to make sure there is no other potential abnormalities or complications.  No need for emergent surgical intervention.  Extensive counseling provided - CT Abdomen Pelvis W Contrast; Future    Greater than 50% of the 25mnutes  visit was spent in counseling/coordination of care   DCaroleen Hamman MD FStarkweatherSurgeon

## 2020-09-07 ENCOUNTER — Encounter: Payer: Medicaid Other | Admitting: Obstetrics and Gynecology

## 2020-09-10 ENCOUNTER — Encounter: Payer: Medicaid Other | Admitting: Obstetrics and Gynecology

## 2020-09-16 ENCOUNTER — Other Ambulatory Visit (HOSPITAL_COMMUNITY)
Admission: RE | Admit: 2020-09-16 | Discharge: 2020-09-16 | Disposition: A | Discharge status: Home / Self Care | Payer: Medicare Other | Source: Ambulatory Visit | Attending: Obstetrics and Gynecology | Admitting: Obstetrics and Gynecology

## 2020-09-16 ENCOUNTER — Other Ambulatory Visit: Payer: Self-pay

## 2020-09-16 ENCOUNTER — Encounter: Payer: Self-pay | Admitting: Obstetrics and Gynecology

## 2020-09-16 ENCOUNTER — Ambulatory Visit (INDEPENDENT_AMBULATORY_CARE_PROVIDER_SITE_OTHER): Payer: Medicare Other | Admitting: Obstetrics and Gynecology

## 2020-09-16 VITALS — BP 118/72 | Ht 64.0 in | Wt 166.2 lb

## 2020-09-16 DIAGNOSIS — R87610 Atypical squamous cells of undetermined significance on cytologic smear of cervix (ASC-US): Secondary | ICD-10-CM | POA: Diagnosis not present

## 2020-09-16 DIAGNOSIS — Z1151 Encounter for screening for human papillomavirus (HPV): Secondary | ICD-10-CM | POA: Diagnosis not present

## 2020-09-16 DIAGNOSIS — N6325 Unspecified lump in the left breast, overlapping quadrants: Secondary | ICD-10-CM

## 2020-09-16 DIAGNOSIS — Z124 Encounter for screening for malignant neoplasm of cervix: Secondary | ICD-10-CM

## 2020-09-16 DIAGNOSIS — Z1231 Encounter for screening mammogram for malignant neoplasm of breast: Secondary | ICD-10-CM | POA: Diagnosis not present

## 2020-09-16 DIAGNOSIS — N644 Mastodynia: Secondary | ICD-10-CM | POA: Diagnosis not present

## 2020-09-16 DIAGNOSIS — Z01419 Encounter for gynecological examination (general) (routine) without abnormal findings: Secondary | ICD-10-CM | POA: Insufficient documentation
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
## 2020-09-16 NOTE — Patient Instructions (Signed)
Exercising to Stay Healthy To become healthy and stay healthy, it is recommended that you do moderate-intensity and vigorous-intensity exercise. You can tell that you are exercising at a moderate intensity if your heart starts beating faster and you start breathing faster but can still hold a conversation. You can tell that you are exercising at a vigorous intensity if you are breathing much harder andfaster and cannot hold a conversation while exercising. Exercising regularly is important. It has many health benefits, such as: Improving overall fitness, flexibility, and endurance. Increasing bone density. Helping with weight control. Decreasing body fat. Increasing muscle strength. Reducing stress and tension. Improving overall health. How often should I exercise? Choose an activity that you enjoy, and set realistic goals. Your health careprovider can help you make an activity plan that works for you. Exercise regularly as told by your health care provider. This may include: Doing strength training two times a week, such as: Lifting weights. Using resistance bands. Push-ups. Sit-ups. Yoga. Doing a certain intensity of exercise for a given amount of time. Choose from these options: A total of 150 minutes of moderate-intensity exercise every week. A total of 75 minutes of vigorous-intensity exercise every week. A mix of moderate-intensity and vigorous-intensity exercise every week. Children, pregnant women, people who have not exercised regularly, people who are overweight, and older adults may need to talk with a health care provider about what activities are safe to do. If you have a medical condition, be sureto talk with your health care provider before you start a new exercise program. What are some exercise ideas? Moderate-intensity exercise ideas include: Walking 1 mile (1.6 km) in about 15 minutes. Biking. Hiking. Golfing. Dancing. Water aerobics. Vigorous-intensity exercise  ideas include: Walking 4.5 miles (7.2 km) or more in about 1 hour. Jogging or running 5 miles (8 km) in about 1 hour. Biking 10 miles (16.1 km) or more in about 1 hour. Lap swimming. Roller-skating or in-line skating. Cross-country skiing. Vigorous competitive sports, such as football, basketball, and soccer. Jumping rope. Aerobic dancing. What are some everyday activities that can help me to get exercise? Yard work, such as: Psychologist, educational. Raking and bagging leaves. Washing your car. Pushing a stroller. Shoveling snow. Gardening. Washing windows or floors. How can I be more active in my day-to-day activities? Use stairs instead of an elevator. Take a walk during your lunch break. If you drive, park your car farther away from your work or school. If you take public transportation, get off one stop early and walk the rest of the way. Stand up or walk around during all of your indoor phone calls. Get up, stretch, and walk around every 30 minutes throughout the day. Enjoy exercise with a friend. Support to continue exercising will help you keep a regular routine of activity. What guidelines can I follow while exercising? Before you start a new exercise program, talk with your health care provider. Do not exercise so much that you hurt yourself, feel dizzy, or get very short of breath. Wear comfortable clothes and wear shoes with good support. Drink plenty of water while you exercise to prevent dehydration or heat stroke. Work out until your breathing and your heartbeat get faster. Where to find more information [place] Department of Health and Human Services: [employer].at Centers for Disease Control and Prevention (CDC): http://www.wolf.info/ Summary Exercising regularly is important. It will improve your overall fitness, flexibility, and endurance. Regular exercise also will improve your overall health. It can help you control your weight,  reduce stress, and improve your bone  density. Do not exercise so much that you hurt yourself, feel dizzy, or get very short of breath. Before you start a new exercise program, talk with your health care provider. This information is not intended to replace advice given to you by your health care provider. Make sure you discuss any questions you have with your healthcare provider. Document Revised: 01/17/2020 Document Reviewed: 01/17/2020 Elsevier Patient Education  2022 [place]. Budget-Friendly Healthy Eating There are many ways to save money at the grocery store and continue to eat healthy. You can be successful if you: Plan meals according to your budget. Make a grocery list and only purchase food according to your grocery list. Prepare food yourself at home. What are tips for following this plan? Reading food labels Compare food labels between brand name foods and the store brand. Often the nutritional value is the same, but the store brand is lower cost. Look for products that do not have added sugar, fat, or salt (sodium). These often cost the same but are healthier for you. Products may be labeled as: Sugar-free. Nonfat. Low-fat. Sodium-free. Low-sodium. Look for lean ground beef labeled as at least 92% lean and 8% fat. Shopping  Buy only the items on your grocery list and go only to the areas of the store that have the items on your list. Use coupons only for foods and brands you normally buy. Avoid buying items you wouldn't normally buy simply because they are on sale. Check online and in newspapers for weekly deals. Buy healthy items from the bulk bins when available, such as herbs, spices, flour, pasta, nuts, and dried fruit. Buy fruits and vegetables that are in season. Prices are usually lower on in-season produce. Look at the unit price on the price tag. Use it to compare different brands and sizes to find out which item is the best deal. Choose healthy items that are often low-cost, such as carrots,  potatoes, apples, bananas, and oranges. Dried or canned beans are a low-cost protein source. Buy in bulk and freeze extra food. Items you can buy in bulk include meats, fish, poultry, frozen fruits, and frozen vegetables. Avoid buying "ready-to-eat" foods, such as pre-cut fruits and vegetables and pre-made salads. If possible, shop around to discover where you can find the best prices. Consider other retailers such as dollar stores, larger [employer], local fruit and vegetable stands, and farmers markets. Do not shop when you are hungry. If you shop while hungry, it may be hard to stick to your list and budget. Resist impulse buying. Use your grocery list as your official plan for the week. Buy a variety of vegetables and fruits by purchasing fresh, frozen, and canned items. Look at the top and bottom shelves for deals. Foods at eye level (eye level of an adult or child) are usually more expensive. Be efficient with your time when shopping. The more time you spend at the store, the more money you are likely to spend. To save money when choosing more expensive foods like meats and dairy: Choose cheaper cuts of meat, such as bone-in chicken thighs and drumsticks instead of skinless and boneless chicken. When you are ready to prepare the chicken, you can remove the skin yourself to make it healthier. Choose lean meats like chicken or [place] instead of beef. Choose canned seafood, such as tuna, salmon, or sardines. Buy eggs as a low-cost source of protein. Buy dried beans and peas, such as lentils, split  peas, or kidney beans instead of meats. Dried beans and peas are a good alternative source of protein. Buy the larger tubs of yogurt instead of individual-sized containers. Choose water instead of sodas and other sweetened beverages. Avoid buying chips, cookies, and other "junk food." These items are usually expensive and not healthy.  Cooking Make extra food and freeze the extras in meal-sized  containers or in individual portions for fast meals and snacks. Pre-cook on days when you have extra time to prepare meals in advance. You can keep these meals in the fridge or freezer and reheat for a quick meal. When you come home from the grocery store, wash, peel, and cut fruits and vegetables so they are ready to use and eat. This will help reduce food waste. Meal planning Do not eat out or get fast food. Prepare food at home. Make a grocery list and make sure to bring it with you to the store. If you have a smart phone, you could use your phone to create your shopping list. Plan meals and snacks according to a grocery list and budget you create. Use leftovers in your meal plan for the week. Look for recipes where you can cook once and make enough food for two meals. Prepare budget-friendly types of meals like stews, casseroles, and stir-fry dishes. Try some meatless meals or try "no cook" meals like salads. Make sure that half your plate is filled with fruits or vegetables. Choose from fresh, frozen, or canned fruits and vegetables. If eating canned, remember to rinse them before eating. This will remove any excess salt added for packaging. Summary Eating healthy on a budget is possible if you plan your meals according to your budget, purchase according to your budget and grocery list, and prepare food yourself. Tips for buying more food on a limited budget include buying generic brands, using coupons only for foods you normally buy, and buying healthy items from the bulk bins when available. Tips for buying cheaper food to replace expensive food include choosing cheaper, lean cuts of meat, and buying dried beans and peas. This information is not intended to replace advice given to you by your health care provider. Make sure you discuss any questions you have with your healthcare provider. Document Revised: 11/14/2019 Document Reviewed: 11/14/2019 Elsevier Patient Education  [place] protect organs, store calcium, anchor muscles, and support the whole body. Keeping your bones strong is important, especially as you get older. Youcan take actions to help keep your bones strong and healthy. Why is keeping my bones healthy important?  Keeping your bones healthy is important because your body constantly replaces bone cells. Cells get old, and new cells take their place. As we age, we lose bone cells because the body may not be able to make enough new cells to replace the old cells. The amount of bone cells and bone tissue you have is referred toas bone mass. The higher your bone mass, the stronger your bones. The aging process leads to an overall loss of bone mass in the body, which can increase the likelihood of: Joint pain and stiffness. Broken bones. A condition in which the bones become weak and brittle (osteoporosis). A large decline in bone mass occurs in older adults. In women, it occurs aboutthe time of menopause. What actions can I take to keep my bones healthy? Good health habits are important for maintaining healthy bones. This includes eating nutritious foods and exercising regularly. To have healthy bones,  you need to get enough of the right minerals and vitamins. Most nutrition experts recommend getting these nutrients from the foods that you eat. In some cases, taking supplements may also be recommended. Doing certain types of exercise isalso important for bone health. What are the nutritional recommendations for healthy bones?  Eating a well-balanced diet with plenty of calcium and vitamin D will help to protect your bones. Nutritional recommendations vary from person to person. Ask your health care provider what is healthy for you. Here are some generalguidelines. Get enough calcium Calcium is the most important (essential) mineral for bone health. Most people can get enough calcium from their diet, but supplements may be recommended for people  who are at risk for osteoporosis. Good sources of calcium include: Dairy products, such as low-fat or nonfat milk, cheese, and yogurt. Dark green leafy vegetables, such as bok choy and broccoli. Calcium-fortified foods, such as orange juice, cereal, bread, soy beverages, and tofu products. Nuts, such as almonds. Follow these recommended amounts for daily calcium intake: Children, age 60-3: 700 mg. Children, age 71-8: 1,000 mg. Children, age 712-13: 1,300 mg. Teens, age 37-18: 1,300 mg. Adults, age 35-50: 1,000 mg. Adults, age 712-70: Men: 1,000 mg. Women: 1,200 mg. Adults, age 711 or older: 1,200 mg. Pregnant and breastfeeding females: Teens: 1,300 mg. Adults: 1,000 mg. Get enough vitamin D Vitamin D is the most essential vitamin for bone health. It helps the body absorb calcium. Sunlight stimulates the skin to make vitamin D, so be sure to get enough sunlight. If you live in a cold climate or you do not get outside often, your health care provider may recommend that you take vitamin D supplements. Good sources of vitamin D in your diet include: Egg yolks. [place] fish. Milk and cereal fortified with vitamin D. Follow these recommended amounts for daily vitamin D intake: Children and teens, age 60-18: 600 international units. Adults, age 57 or younger: 400-800 international units. Adults, age 26 or older: 800-1,000 international units. Get other important nutrients Other nutrients that are important for bone health include: Phosphorus. This mineral is found in meat, poultry, dairy foods, nuts, and legumes. The recommended daily intake for adult men and adult women is 700 mg. Magnesium. This mineral is found in seeds, nuts, dark green vegetables, and legumes. The recommended daily intake for adult men is 400-420 mg. For adult women, it is 310-320 mg. Vitamin K. This vitamin is found in green leafy vegetables. The recommended daily intake is 120 mg for adult men and 90 mg for adult  women. What type of physical activity is best for building and maintaining healthybones? Weight-bearing and strength-building activities are important for building and maintaining healthy bones. Weight-bearing activities cause muscles and bones to work against gravity. Strength-building activities increase the strength of the muscles that support bones. Weight-bearing and muscle-building activities include: Walking and hiking. Jogging and running. Dancing. Gym exercises. Lifting weights. Tennis and racquetball. Climbing stairs. Aerobics. Adults should get at least 30 minutes of moderate physical activity on most days. Children should get at least 60 minutes of moderate physical activity onmost days. Ask your health care provider what type of exercise is best for you. How can I find out if my bone mass is low? Bone mass can be measured with an X-ray test called a bone mineral density (BMD) test. This test is recommended for all women who are age 73 or older. It may also be recommended for: Men who are age 50 or older. People who  are at risk for osteoporosis because of: Having bones that break easily. Having a long-term disease that weakens bones, such as kidney disease or rheumatoid arthritis. Having menopause earlier than normal. Taking medicine that weakens bones, such as steroids, thyroid hormones, or hormone treatment for breast cancer or prostate cancer. Smoking. Drinking three or more alcoholic drinks a day. If you find that you have a low bone mass, you may be able to preventosteoporosis or further bone loss by changing your diet and lifestyle. Where can I find more information? For more information, check out the following websites: [place]: [employer] of Health: www.bones.SouthExposed.es International Osteoporosis Foundation: Administrator.iofbonehealth.org Summary The aging process leads to an overall loss of bone mass in the body, which can  increase the likelihood of broken bones and osteoporosis. Eating a well-balanced diet with plenty of calcium and vitamin D will help to protect your bones. Weight-bearing and strength-building activities are also important for building and maintaining strong bones. Bone mass can be measured with an X-ray test called a bone mineral density (BMD) test. This information is not intended to replace advice given to you by your health care provider. Make sure you discuss any questions you have with your healthcare provider. Document Revised: 02/27/2017 Document Reviewed: 02/27/2017 Elsevier Patient Education  2022 [employer].

## 2020-09-17 ENCOUNTER — Ambulatory Visit
Admission: RE | Admit: 2020-09-17 | Discharge: 2020-09-17 | Disposition: A | Discharge status: Home / Self Care | Payer: Medicare Other | Source: Ambulatory Visit | Attending: Surgery | Admitting: Surgery

## 2020-09-17 DIAGNOSIS — R1084 Generalized abdominal pain: Secondary | ICD-10-CM | POA: Diagnosis not present

## 2020-09-17 LAB — POCT I-STAT CREATININE: Creatinine, Ser: 0.7 mg/dL (ref 0.44–1.00)

## 2020-09-17 MED ORDER — IOHEXOL 350 MG/ML SOLN
100.0000 mL | Freq: Once | INTRAVENOUS | Status: AC | PRN
  Administered 2020-09-17: 75 mL via INTRAVENOUS

## 2020-09-20 LAB — CYTOLOGY - PAP
Comment: NEGATIVE
Diagnosis: UNDETERMINED — AB
High risk HPV: NEGATIVE

## 2020-09-21 ENCOUNTER — Encounter: Payer: Self-pay | Admitting: Surgery

## 2020-09-21 ENCOUNTER — Ambulatory Visit (INDEPENDENT_AMBULATORY_CARE_PROVIDER_SITE_OTHER): Payer: Medicare Other | Admitting: Surgery

## 2020-09-21 ENCOUNTER — Other Ambulatory Visit: Payer: Self-pay

## 2020-09-21 ENCOUNTER — Telehealth: Payer: Self-pay

## 2020-09-21 VITALS — BP 128/72 | HR 87 | Temp 98.0°F | Ht 64.0 in | Wt 164.0 lb

## 2020-09-21 DIAGNOSIS — R1084 Generalized abdominal pain: Secondary | ICD-10-CM

## 2020-09-21 NOTE — Telephone Encounter (Signed)
Patient notified CT results

## 2020-09-21 NOTE — Patient Instructions (Signed)
   Follow-up with our office as needed.  Please call and ask to speak with a nurse if you develop questions or concerns.  

## 2020-09-21 NOTE — Progress Notes (Signed)
Outpatient Surgical Follow Up  09/21/2020  Amber Christensen is an 55 y.o. female.   Chief Complaint  Patient presents with   Follow-up    HPI: Amber Christensen is a 55 year old female with history of chronic abdominal pain.  I did a robotic hernia repair 2 1/2 years ago. Repeated a CT scan of the abdomen and pelvis that I have personally reviewed showing no evidence of hernias or fistulous.  Continues to endorse diarrhea and incontinence.  Pain is diffuse intermittent and mild to moderate intensity. She does have a history of prior small bowel resection and right colectomy several years ago   Past Medical History:  Diagnosis Date   Allergy    many many allergies   Anemia    in past   Anginal pain ([place]) 01/2018   cardiac workup clear   Anxiety    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[place])    pt unaware of this diagnosis   Bipolar disorder ([place])    Bladder mass    removed and had tbt. mesh removed   Blood transfusion without reported diagnosis 1999   received 20 units of blood after tearing esophagus from vomitting so severely after ERCP   Breast mass    left side biopsy was negative   Carpal tunnel syndrome    left hand   Cervical cancer ([place]) 2002   Chronic kidney disease    cyst on left kidney   Chronic pancreatitis ([place])    prior to pancreas surgery   Cirrhosis ([place])    Clotting disorder ([place])    pt states that sometimes she has difficulty stopping to bleed and other times it is okay   Depression    Diabetes mellitus without complication (HCC)    has insulin pump   Dyspnea    Esophagitis    GERD (gastroesophageal reflux disease)    Headache    migraines...takes compazine for this   Heart disease    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 left kidney mass.   Liver disease    NASH. has had liver biopsies. negative except for NASH   NASH  (nonalcoholic steatohepatitis)    NASH (nonalcoholic steatohepatitis)    Pancreatitis    PONV (postoperative nausea and vomiting)    Stroke ([place]) 11/2017   had tia. no longer needs plavix   Thyroid disease     Past Surgical History:  Procedure Laterality Date   ABDOMINAL HYSTERECTOMY     APPENDECTOMY     BLADDER SURGERY     mesh removed. revision which has caused everything to fall again   BOWEL RESECTION     took a piece of bowel out after pancreatectomy caused problem with bowel.    BREAST BIOPSY Left 1989   benign   CARDIAC CATHETERIZATION  9983,3825, 1998   no stents, fatty streaks   CHOLECYSTECTOMY     COLON SURGERY     COLONOSCOPY WITH PROPOFOL N/A 10/08/2018   Procedure: COLONOSCOPY WITH PROPOFOL;  Surgeon: Lollie Sails, MD;  Location: [facility] ENDOSCOPY;  Service: Endoscopy;  Laterality: N/A;   CYST REMOVAL HAND Left 1992   Ganglion cyst removed from Left Wrist   CYSTECTOMY     CYSTO WITH HYDRODISTENSION N/A 03/04/2019   Procedure: CYSTOSCOPY/HYDRODISTENSION;  Surgeon: Bjorn Loser, MD;  Location: [facility] ORS;  Service: Urology;  Laterality: N/A;   DILATATION & CURETTAGE/HYSTEROSCOPY WITH MYOSURE     DILATION AND CURETTAGE OF UTERUS     ERCP     ESOPHAGOGASTRODUODENOSCOPY     HERNIA REPAIR     KNEE ARTHROSCOPY Left 1992   LEFT HEART CATH AND CORONARY ANGIOGRAPHY Left 02/06/2019   Procedure: LEFT HEART CATH AND CORONARY ANGIOGRAPHY;  Surgeon: Yolonda Kida, MD;  Location: [place] CV LAB;  Service: Cardiovascular;  Laterality: Left;   LIVER BIOPSY     PANCREAS SURGERY  2006   partial pancreatectomy after endoscope knicked a part of pancreas.    PORTA CATH INSERTION N/A 04/10/2017   Procedure: PORTA CATH INSERTION;  Surgeon: Algernon Huxley, MD;  Location: [place] CV LAB;  Service: Cardiovascular;  Laterality: N/A;   PORTA CATH INSERTION N/A 02/21/2019   Procedure: PORTA CATH INSERTION;  Surgeon: Algernon Huxley, MD;  Location: [place] CV LAB;   Service: Cardiovascular;  Laterality: N/A;   REPAIR OF ESOPHAGUS  2012   3 clamps placed in esophagus   ROBOTIC ASSISTED LAPAROSCOPIC VENTRAL/INCISIONAL HERNIA REPAIR N/A 03/15/2018   Procedure: ROBOTIC ASSISTED LAPAROSCOPIC VENTRAL HERNIA REPAIR;  Surgeon: Jules Husbands, MD;  Location: [facility] ORS;  Service: General;  Laterality: N/A;   SMALL BOWEL REPAIR     TONSILLECTOMY      Family History  Problem Relation Age of Onset   Cirrhosis Mother    Colon cancer Mother 41       TAH/BSO in ~103; deceased at 15   Hypertension Mother    Breast cancer Mother 78       unconfirmed   Hypertension Father    Prostate cancer Father 36       currently 48   Other Father        liver disease   Breast cancer Maternal Aunt        age at dx unk.; currently 31   Cervical cancer Maternal Aunt    Cancer Maternal Uncle        unk. type; currently 51s   Stomach cancer Paternal Aunt 56       deceased at 41   Breast cancer Maternal Grandmother 52       possibly bilateral in 73s; deceased 30   Non-Hodgkin's lymphoma Daughter        unconfirmed; currently 63   Cancer Maternal Aunt        hematologic malignancy; deceased 48    Social History:  reports that she has never smoked. She has never used smokeless tobacco. She reports previous alcohol use. She reports that she does not use drugs.  Allergies:  Allergies  Allergen Reactions   Ciprofloxacin     Rash, nausea vomitting   Demerol [Meperidine] Hives and Other (See Comments)    Pt states that this medication causes cardiac arrest.     Dilaudid [Hydromorphone Hcl] Nausea And Vomiting and Other (See Comments)    Pt states that this medication causes cardiac arrest.     Metoclopramide Hives    Breathing issues   Morphine And Related Anaphylaxis and Other (See Comments)    Pt states that this medication causes cardiac arrest   Morpholine Salicylate Nausea And Vomiting, Other (See Comments) and Rash    CHF   Isosorbide Nitrate Other (See Comments)     Headache    Sulfa Antibiotics Nausea And Vomiting   Flagyl [Metronidazole]     Rash, heart racing, nausea vomitting   Adhesive [Tape] Rash  Blisters skin Paper tape is okay   Darvon [Propoxyphene] Nausea And Vomiting and Rash   Flexeril [Cyclobenzaprine] Nausea And Vomiting and Rash   Levaquin [Levofloxacin In D5w] Nausea And Vomiting and Rash   Naproxen Rash    Ibuprofen and advil are not a problem   Soma [Carisoprodol] Nausea And Vomiting and Rash   Zofran [Ondansetron Hcl] Nausea And Vomiting and Rash    Medications reviewed.    ROS Full ROS performed and is otherwise negative other than what is stated in HPI   BP 128/72   Pulse 87   Temp 98 F (36.7 C)   Ht 5' 4"  (1.626 m)   Wt 164 lb (74.4 kg)   SpO2 97%   BMI 28.15 kg/m   Physical Exam Vitals and nursing note reviewed. Exam conducted with a chaperone present.  Constitutional:      Appearance: Normal appearance. She is normal weight.  Pulmonary:     Effort: Pulmonary effort is normal. No respiratory distress.     Breath sounds: No stridor.  Abdominal:     General: Abdomen is flat. There is no distension.     Palpations: Abdomen is soft. There is no mass.     Tenderness: There is abdominal tenderness. There is no guarding or rebound.     Hernia: No hernia is present.  Skin:    General: Skin is warm and dry.  Neurological:     General: No focal deficit present.     Mental Status: She is alert and oriented to person, place, and time.  Psychiatric:        Mood and Affect: Mood normal.        Behavior: Behavior normal.        Thought Content: Thought content normal.        Judgment: Judgment normal.       Assessment/Plan: 55 year old female with chronic abdominal pain that at this time is functional in nature.  No need for any surgical intervention at this time.  No evidence of hernias.  We will see her on a prn basis.  I did explain the situation in detail my thought process.  Greater than 50% of  the 30 minutes  visit was spent in counseling/coordination of care   Caroleen Hamman, MD [facility]

## 2020-09-22 ENCOUNTER — Other Ambulatory Visit: Payer: Medicare Other

## 2020-09-23 ENCOUNTER — Other Ambulatory Visit: Payer: Self-pay | Admitting: Obstetrics and Gynecology

## 2020-09-23 ENCOUNTER — Other Ambulatory Visit: Payer: Self-pay

## 2020-09-23 ENCOUNTER — Ambulatory Visit
Admission: RE | Admit: 2020-09-23 | Discharge: 2020-09-23 | Disposition: A | Discharge status: Home / Self Care | Payer: Medicare Other | Source: Ambulatory Visit | Attending: Obstetrics and Gynecology | Admitting: Obstetrics and Gynecology

## 2020-09-23 DIAGNOSIS — N644 Mastodynia: Secondary | ICD-10-CM

## 2020-10-08 ENCOUNTER — Encounter: Payer: Self-pay | Admitting: Obstetrics and Gynecology

## 2020-10-08 ENCOUNTER — Other Ambulatory Visit: Payer: Self-pay

## 2020-10-08 ENCOUNTER — Ambulatory Visit (INDEPENDENT_AMBULATORY_CARE_PROVIDER_SITE_OTHER): Payer: Medicare Other | Admitting: Obstetrics and Gynecology

## 2020-10-08 VITALS — BP 120/73 | Ht 64.0 in | Wt 160.2 lb

## 2020-10-08 DIAGNOSIS — Z803 Family history of malignant neoplasm of breast: Secondary | ICD-10-CM | POA: Diagnosis not present

## 2020-10-08 NOTE — Progress Notes (Signed)
Patient ID: Amber Christensen, female   DOB: 08/19/1965, 55 y.o.   MRN: [id number]  Reason for Consult: Follow-up (Test results)   Referred by [name], MD  Subjective:     HPI:  Amber Christensen is a 55 y.o. female she is following up today.  She brought up several concerns at her last visit which did not have time to be addressed.  Today the plan is to discuss her family history of breast cancer and see if she is at increased risk which would qualify her for breast MRI.  Gynecological History  No LMP recorded. Patient has had a hysterectomy.  Past Medical History:  Diagnosis Date   Allergy    many many allergies   Anemia    in past   Anginal pain ([place]) 01/2018   cardiac workup clear   Anxiety    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[place])    pt unaware of this diagnosis   Bipolar disorder ([place])    Bladder mass    removed and had tbt. mesh removed   Blood transfusion without reported diagnosis 1999   received 20 units of blood after tearing esophagus from vomitting so severely after ERCP   Breast mass    left side biopsy was negative   Carpal tunnel syndrome    left hand   Cervical cancer ([place]) 2002   Chronic kidney disease    cyst on left kidney   Chronic pancreatitis ([place])    prior to pancreas surgery   Cirrhosis ([place])    Clotting disorder ([place])    pt states that sometimes she has difficulty stopping to bleed and other times it is okay   Depression    Diabetes mellitus without complication (HCC)    has insulin pump   Dyspnea    Esophagitis    GERD (gastroesophageal reflux disease)    Headache    migraines...takes compazine for this   Heart disease    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 left kidney mass.   Liver disease    NASH. has had liver biopsies. negative except for NASH   NASH (nonalcoholic steatohepatitis)     NASH (nonalcoholic steatohepatitis)    Pancreatitis    PONV (postoperative nausea and vomiting)    Stroke ([place]) 11/2017   had tia. no longer needs plavix   Thyroid disease    Family History  Problem Relation Age of Onset   Cirrhosis Mother    Colon cancer Mother 75       TAH/BSO in ~47; deceased at 76   Hypertension Mother    Breast cancer Mother 52       unconfirmed   Hypertension Father    Prostate cancer Father 31       currently 40   Other Father        liver disease   Breast cancer Sister    Non-Hodgkin's lymphoma Daughter        unconfirmed; currently 64   Breast cancer Maternal Aunt        age at dx unk.; currently 84   Cervical cancer Maternal Aunt    Cancer Maternal Aunt        hematologic malignancy; deceased 44   Cancer Maternal Uncle        unk. type; currently 72s  Stomach cancer Paternal Aunt 20       deceased at 42   Breast cancer Maternal Grandmother 45       possibly bilateral in 76s; deceased 61   Past Surgical History:  Procedure Laterality Date   ABDOMINAL HYSTERECTOMY     APPENDECTOMY     BLADDER SURGERY     mesh removed. revision which has caused everything to fall again   BOWEL RESECTION     took a piece of bowel out after pancreatectomy caused problem with bowel.    BREAST BIOPSY Left 1989   benign   CARDIAC CATHETERIZATION  3300,7622, 1998   no stents, fatty streaks   CHOLECYSTECTOMY     COLON SURGERY     COLONOSCOPY WITH PROPOFOL N/A 10/08/2018   Procedure: COLONOSCOPY WITH PROPOFOL;  Surgeon: Lollie Sails, MD;  Location: [facility] ENDOSCOPY;  Service: Endoscopy;  Laterality: N/A;   CYST REMOVAL HAND Left 1992   Ganglion cyst removed from Left Wrist   CYSTECTOMY     CYSTO WITH HYDRODISTENSION N/A 03/04/2019   Procedure: CYSTOSCOPY/HYDRODISTENSION;  Surgeon: Bjorn Loser, MD;  Location: [facility] ORS;  Service: Urology;  Laterality: N/A;   DILATATION & CURETTAGE/HYSTEROSCOPY WITH MYOSURE     DILATION AND CURETTAGE OF UTERUS      ERCP     ESOPHAGOGASTRODUODENOSCOPY     HERNIA REPAIR     KNEE ARTHROSCOPY Left 1992   LEFT HEART CATH AND CORONARY ANGIOGRAPHY Left 02/06/2019   Procedure: LEFT HEART CATH AND CORONARY ANGIOGRAPHY;  Surgeon: Yolonda Kida, MD;  Location: [place] CV LAB;  Service: Cardiovascular;  Laterality: Left;   LIVER BIOPSY     PANCREAS SURGERY  2006   partial pancreatectomy after endoscope knicked a part of pancreas.    PORTA CATH INSERTION N/A 04/10/2017   Procedure: PORTA CATH INSERTION;  Surgeon: Algernon Huxley, MD;  Location: [place] CV LAB;  Service: Cardiovascular;  Laterality: N/A;   PORTA CATH INSERTION N/A 02/21/2019   Procedure: PORTA CATH INSERTION;  Surgeon: Algernon Huxley, MD;  Location: [place] CV LAB;  Service: Cardiovascular;  Laterality: N/A;   REPAIR OF ESOPHAGUS  2012   3 clamps placed in esophagus   ROBOTIC ASSISTED LAPAROSCOPIC VENTRAL/INCISIONAL HERNIA REPAIR N/A 03/15/2018   Procedure: ROBOTIC ASSISTED LAPAROSCOPIC VENTRAL HERNIA REPAIR;  Surgeon: Jules Husbands, MD;  Location: [facility] ORS;  Service: General;  Laterality: N/A;   SMALL BOWEL REPAIR     TONSILLECTOMY      Short Social History:  Social History   Tobacco Use   Smoking status: Never   Smokeless tobacco: Never  Substance Use Topics   Alcohol use: Not Currently    Comment: rarely    Allergies  Allergen Reactions   Ciprofloxacin     Rash, nausea vomitting   Demerol [Meperidine] Hives and Other (See Comments)    Pt states that this medication causes cardiac arrest.     Dilaudid [Hydromorphone Hcl] Nausea And Vomiting and Other (See Comments)    Pt states that this medication causes cardiac arrest.     Metoclopramide Hives    Breathing issues   Morphine And Related Anaphylaxis and Other (See Comments)    Pt states that this medication causes cardiac arrest   Morpholine Salicylate Nausea And Vomiting, Other (See Comments) and Rash    CHF   Isosorbide Nitrate Other (See Comments)     Headache    Sulfa Antibiotics Nausea And Vomiting   Flagyl [Metronidazole]  Rash, heart racing, nausea vomitting   Adhesive [Tape] Rash    Blisters skin Paper tape is okay   Darvon [Propoxyphene] Nausea And Vomiting and Rash   Flexeril [Cyclobenzaprine] Nausea And Vomiting and Rash   Levaquin [Levofloxacin In D5w] Nausea And Vomiting and Rash   Naproxen Rash    Ibuprofen and advil are not a problem   Soma [Carisoprodol] Nausea And Vomiting and Rash   Zofran [Ondansetron Hcl] Nausea And Vomiting and Rash    Current Outpatient Medications  Medication Sig Dispense Refill   aspirin EC 81 MG tablet Take 1 tablet (81 mg total) by mouth daily. Swallow whole. 30 tablet 1   Calcium Carb-Cholecalciferol (CALCIUM 600/VITAMIN D3 PO) Take 1 tablet by mouth daily.     Cholecalciferol (VITAMIN D-3) 5000 units TABS Take 5,000 Units by mouth daily.     Continuous Blood Gluc Receiver (DEXCOM G6 RECEIVER) DEVI Use to monitor blood sugar. [place] [phone]     estradiol (ESTRACE) 0.1 MG/GM vaginal cream Place vaginally.     furosemide (LASIX) 20 MG tablet Take 20 mg by mouth daily.     gabapentin (NEURONTIN) 300 MG capsule Take 600 mg by mouth 3 (three) times daily.     hydrochlorothiazide (HYDRODIURIL) 25 MG tablet Take by mouth.     insulin lispro (HUMALOG) 100 UNIT/ML injection SMARTSIG:0-70 Unit(s) SUB-Q Daily     lipase/protease/amylase (CREON) 36000 UNITS CPEP capsule Take 144,000 Units by mouth in the morning, at noon, in the evening, and at bedtime.     lisinopril (ZESTRIL) 40 MG tablet Take 40 mg by mouth daily.     Magnesium 250 MG TABS Take 250 mg by mouth daily.      metoprolol succinate (TOPROL-XL) 25 MG 24 hr tablet Take 25 mg by mouth daily.     nitroGLYCERIN (NITROSTAT) 0.3 MG SL tablet Place 0.3 mg under the tongue every 5 (five) minutes as needed for chest pain.     vitamin E 400 UNIT capsule Take 400 Units by mouth daily.     atorvastatin (LIPITOR) 40 MG tablet Take 1 tablet (40  mg total) by mouth daily. 30 tablet 0   ELMIRON 100 MG capsule Take 100 mg by mouth 3 (three) times daily.     EPINEPHrine 0.3 mg/0.3 mL IJ SOAJ injection SMARTSIG:1 Pre-Filled Pen Syringe IM PRN     No current facility-administered medications for this visit.    Review of Systems  Constitutional: Negative for chills, fatigue, fever and unexpected weight change.  HENT: Negative for trouble swallowing.  Eyes: Negative for loss of vision.  Respiratory: Negative for cough, shortness of breath and wheezing.  Cardiovascular: Negative for chest pain, leg swelling, palpitations and syncope.  GI: Negative for abdominal pain, blood in stool, diarrhea, nausea and vomiting.  GU: Negative for difficulty urinating, dysuria, frequency and hematuria.  Musculoskeletal: Negative for back pain, leg pain and joint pain.  Skin: Negative for rash.  Neurological: Negative for dizziness, headaches, light-headedness, numbness and seizures.  Psychiatric: Negative for behavioral problem, confusion, depressed mood and sleep disturbance.       Objective:  Objective   Vitals:   10/08/20 1013  BP: 120/73  Weight: 160 lb 3.2 oz (72.7 kg)  Height: 5' 4"  (1.626 m)   Body mass index is 27.5 kg/m.  Physical Exam Vitals and nursing note reviewed. Exam conducted with a chaperone present.  Constitutional:      Appearance: Normal appearance.  HENT:     Head: Normocephalic and atraumatic.  Eyes:     Extraocular Movements: Extraocular movements intact.     Pupils: Pupils are equal, round, and reactive to light.  Cardiovascular:     Rate and Rhythm: Normal rate and regular rhythm.  Pulmonary:     Effort: Pulmonary effort is normal.     Breath sounds: Normal breath sounds.  Abdominal:     General: Abdomen is flat.     Palpations: Abdomen is soft.  Musculoskeletal:     Cervical back: Normal range of motion.  Skin:    General: Skin is warm and dry.  Neurological:     General: No focal deficit present.      Mental Status: She is alert and oriented to person, place, and time.  Psychiatric:        Behavior: Behavior normal.        Thought Content: Thought content normal.        Judgment: Judgment normal.    Assessment/Plan:    55 year old with family history of breast cancer.  Calculated IBIS with patient. She did not have an increased risk of breast cacner to justify breast MRI. Result below.  Continue with plan for yearly mammogram.    Lifetime risk of breast cancer not elevated for patient to qualify for breast MRI.  Continue with routine breast cancer screening.   Will follow up for vaginal colposcopy. She is concerned regarding possiblity of Vaginal cancer. Reports multiple ASCUS pap smear results.   Will follow up to discuss dyspareunia.   More than 20 minutes were spent face to face with the patient in the room, reviewing the medical record, labs and images, and coordinating care for the patient. The plan of management was discussed in detail and counseling was provided.     Adrian Prows MD [facility] 10/08/2020 11:00 AM

## 2020-10-14 ENCOUNTER — Encounter: Payer: Self-pay | Admitting: Gastroenterology

## 2020-10-14 ENCOUNTER — Ambulatory Visit (INDEPENDENT_AMBULATORY_CARE_PROVIDER_SITE_OTHER): Payer: Medicare Other | Admitting: Gastroenterology

## 2020-10-14 VITALS — BP 160/67 | HR 73 | Temp 97.6°F | Ht 64.0 in | Wt 163.4 lb

## 2020-10-14 DIAGNOSIS — M7918 Myalgia, other site: Secondary | ICD-10-CM

## 2020-10-14 DIAGNOSIS — K760 Fatty (change of) liver, not elsewhere classified: Secondary | ICD-10-CM | POA: Diagnosis not present
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
## 2020-11-10 ENCOUNTER — Emergency Department: Payer: Medicare Other

## 2020-11-10 ENCOUNTER — Emergency Department
Admission: EM | Admit: 2020-11-10 | Discharge: 2020-11-10 | Disposition: A | Discharge status: Home / Self Care | Payer: Medicare Other | Attending: Emergency Medicine | Admitting: Emergency Medicine

## 2020-11-10 ENCOUNTER — Other Ambulatory Visit: Payer: Self-pay

## 2020-11-10 DIAGNOSIS — I129 Hypertensive chronic kidney disease with stage 1 through stage 4 chronic kidney disease, or unspecified chronic kidney disease: Secondary | ICD-10-CM | POA: Insufficient documentation

## 2020-11-10 DIAGNOSIS — E1122 Type 2 diabetes mellitus with diabetic chronic kidney disease: Secondary | ICD-10-CM | POA: Diagnosis not present

## 2020-11-10 DIAGNOSIS — Z794 Long term (current) use of insulin: Secondary | ICD-10-CM | POA: Insufficient documentation

## 2020-11-10 DIAGNOSIS — N189 Chronic kidney disease, unspecified: Secondary | ICD-10-CM | POA: Insufficient documentation

## 2020-11-10 DIAGNOSIS — Z7982 Long term (current) use of aspirin: Secondary | ICD-10-CM | POA: Insufficient documentation

## 2020-11-10 DIAGNOSIS — I10 Essential (primary) hypertension: Secondary | ICD-10-CM

## 2020-11-10 DIAGNOSIS — E876 Hypokalemia: Secondary | ICD-10-CM | POA: Insufficient documentation

## 2020-11-10 DIAGNOSIS — Z8541 Personal history of malignant neoplasm of cervix uteri: Secondary | ICD-10-CM | POA: Insufficient documentation

## 2020-11-10 DIAGNOSIS — Z79899 Other long term (current) drug therapy: Secondary | ICD-10-CM | POA: Insufficient documentation

## 2020-11-10 DIAGNOSIS — R519 Headache, unspecified: Secondary | ICD-10-CM

## 2020-11-10 DIAGNOSIS — R079 Chest pain, unspecified: Secondary | ICD-10-CM

## 2020-11-10 LAB — BASIC METABOLIC PANEL
Anion gap: 11 (ref 5–15)
BUN: 13 mg/dL (ref 6–20)
CO2: 32 mmol/L (ref 22–32)
Calcium: 8.4 mg/dL — ABNORMAL LOW (ref 8.9–10.3)
Chloride: 101 mmol/L (ref 98–111)
Creatinine, Ser: 1 mg/dL (ref 0.44–1.00)
GFR, Estimated: >60 mL/min (ref >60)
Glucose, Bld: 93 mg/dL (ref 70–99)
Potassium: 3.2 mmol/L — ABNORMAL LOW (ref 3.5–5.1)
Sodium: 144 mmol/L (ref 135–145)

## 2020-11-10 LAB — CBC
HCT: 38.4 % (ref 36.0–46.0)
Hemoglobin: 13.3 g/dL (ref 12.0–15.0)
MCH: 29.2 pg (ref 26.0–34.0)
MCHC: 34.6 g/dL (ref 30.0–36.0)
MCV: 84.4 fL (ref 80.0–100.0)
Platelets: 197 10*3/uL (ref 150–400)
RBC: 4.55 MIL/uL (ref 3.87–5.11)
RDW: 12.5 % (ref 11.5–15.5)
WBC: 10.4 10*3/uL (ref 4.0–10.5)
nRBC: 0 % (ref 0.0–0.2)

## 2020-11-10 LAB — TROPONIN I (HIGH SENSITIVITY): Troponin I (High Sensitivity): 4 ng/L (ref <18)

## 2020-11-10 MED ORDER — PROCHLORPERAZINE EDISYLATE 10 MG/2ML IJ SOLN: 10.0000 mg | Freq: Once | INTRAMUSCULAR | Status: DC

## 2020-11-10 MED ORDER — MAGNESIUM SULFATE 2 GM/50ML IV SOLN: 2.0000 g | Freq: Once | INTRAVENOUS | Status: DC

## 2020-11-10 MED ORDER — LACTATED RINGERS IV BOLUS: 1000.0000 mL | Freq: Once | INTRAVENOUS | Status: DC

## 2020-11-10 MED ORDER — POTASSIUM CHLORIDE CRYS ER 20 MEQ PO TBCR
40.0000 meq | EXTENDED_RELEASE_TABLET | Freq: Once | ORAL | Status: AC
  Administered 2020-11-10: 40 meq via ORAL
  Filled 2020-11-10: qty 2

## 2020-11-10 NOTE — ED Provider Notes (Signed)
[facility] Emergency Department Provider Note  ____________________________________________   Event Date/Time   First MD Initiated Contact with Patient 11/10/20 1715     (approximate)  I have reviewed the triage vital signs and the nursing notes.   HISTORY  Chief Complaint Chest Pain   HPI Amber Christensen is a 55 y.o. female with past medical history of HTN, CKD, anxiety, anemia, depression, DM, GERD, IBS, chronic pancreatitis, NASH, and TIA and some chronic left tongue deviation which she states she had since her TIA although never diagnosed with a stroke who presents for assessment of headache and chest pain and elevated blood pressures today.  Patient states she had run out of her blood pressure medicines yesterday because her pharmacy did not have them but got that filled this morning and took her blood pressure medicines this morning including hydrochlorothiazide and lisinopril.  She states that she developed chest pain and prescribed is achiness in the left chest earlier today as well as a throbbing headache.  No trauma or injuries.  No earache, congestion, cough, shortness of breath, fevers, vomiting or diarrhea but she does endorse some nausea.  She denies any abdominal pain, back pain or urinary symptoms.  No new focal extremity weakness numbness or tingling or rashes.  No other acute concerns at this time.  He states that prior to calling EMS her systolic blood pressure was over 200.         Past Medical History:  Diagnosis Date   Allergy    many many allergies   Anemia    in past   Anginal pain ([place]) 01/2018   cardiac workup clear   Anxiety    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[place])    pt unaware of this diagnosis   Bipolar disorder ([place])    Bladder mass    removed and had tbt. mesh removed   Blood transfusion without reported diagnosis 1999   received 20 units of blood after tearing esophagus from vomitting so severely after ERCP   Breast mass     left side biopsy was negative   Carpal tunnel syndrome    left hand   Cervical cancer ([place]) 2002   Chronic kidney disease    cyst on left kidney   Chronic pancreatitis ([place])    prior to pancreas surgery   Cirrhosis ([place])    Clotting disorder ([place])    pt states that sometimes she has difficulty stopping to bleed and other times it is okay   Depression    Diabetes mellitus without complication (HCC)    has insulin pump   Dyspnea    Esophagitis    GERD (gastroesophageal reflux disease)    Headache    migraines...takes compazine for this   Heart disease    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 left kidney mass.   Liver disease    NASH. has had liver biopsies. negative except for NASH   NASH (nonalcoholic steatohepatitis)    NASH (nonalcoholic steatohepatitis)    Pancreatitis    PONV (postoperative nausea and vomiting)    Stroke ([place]) 11/2017   had tia. no longer needs plavix   Thyroid disease     Patient Active Problem List   Diagnosis Date Noted   Neurological deficit present    TIA (transient ischemic  attack) 08/13/2020   Hypertensive urgency 04/11/2020   Syncope and collapse 04/11/2020   Atrophic vaginitis 02/24/2020   Incontinence of feces 31/51/7616   Lichen sclerosus 07/37/1062   Urge incontinence of urine 02/24/2020   Port-A-Cath in place 10/08/2019   Abdominal aortic atherosclerosis ([place]) 10/02/2019   Breast mass 10/02/2019   Hives 10/02/2019   Hypoglycemia 10/02/2019   Irritable bowel syndrome 10/02/2019   Bladder mass 10/02/2019   Left sided abdominal pain of unknown cause 10/02/2019   Migraine 10/02/2019   NASH (nonalcoholic steatohepatitis) 10/02/2019   Acute diarrhea 05/10/2019   Hyperglycemia 09/23/2018   S/P ventral herniorrhaphy 03/15/2018   Ventral hernia without obstruction or gangrene    Leg cramps  03/08/2018   History of TIA (transient ischemic attack) 12/14/2017   Vitamin B 12 deficiency 12/14/2017   Atypical squamous cells of undetermined significance (ASCUS) on Papanicolaou smear of cervix 08/29/2017   Chronic fatigue 06/06/2017   Early satiety 06/06/2017   Hypocalcemia 06/06/2017   Hypertension 03/21/2017   Diabetes mellitus without complication ([place]) 69/48/5462   Pancreatitis 03/21/2017   Poor venous access 03/21/2017   Steatorrhea 03/10/2017   Frontal headache 12/08/2016   Poorly-controlled hypertension 11/08/2016   Encounter for long-term (current) use of high-risk medication 09/01/2016   Low blood pressure reading 09/01/2016   Vasculitis ([place]) 08/03/2016   Hypokalemia 06/10/2016   Acute neck pain 05/16/2016   Acute maculopapular rash 04/21/2016   Maculopapular rash, generalized 04/20/2016   Vaginal bleeding, abnormal 02/18/2016   Unstable angina ([place]) 09/28/2015   Hypomagnesemia 03/03/2015   Long-term insulin use ([place]) 04/20/2014    Past Surgical History:  Procedure Laterality Date   ABDOMINAL HYSTERECTOMY     APPENDECTOMY     BLADDER SURGERY     mesh removed. revision which has caused everything to fall again   BOWEL RESECTION     took a piece of bowel out after pancreatectomy caused problem with bowel.    BREAST BIOPSY Left 1989   benign   CARDIAC CATHETERIZATION  7035,0093, 1998   no stents, fatty streaks   CHOLECYSTECTOMY     COLON SURGERY     COLONOSCOPY WITH PROPOFOL N/A 10/08/2018   Procedure: COLONOSCOPY WITH PROPOFOL;  Surgeon: Lollie Sails, MD;  Location: [facility] ENDOSCOPY;  Service: Endoscopy;  Laterality: N/A;   CYST REMOVAL HAND Left 1992   Ganglion cyst removed from Left Wrist   CYSTECTOMY     CYSTO WITH HYDRODISTENSION N/A 03/04/2019   Procedure: CYSTOSCOPY/HYDRODISTENSION;  Surgeon: Bjorn Loser, MD;  Location: [facility] ORS;  Service: Urology;  Laterality: N/A;   DILATATION & CURETTAGE/HYSTEROSCOPY WITH MYOSURE     DILATION AND  CURETTAGE OF UTERUS     ERCP     ESOPHAGOGASTRODUODENOSCOPY     HERNIA REPAIR     KNEE ARTHROSCOPY Left 1992   LEFT HEART CATH AND CORONARY ANGIOGRAPHY Left 02/06/2019   Procedure: LEFT HEART CATH AND CORONARY ANGIOGRAPHY;  Surgeon: Yolonda Kida, MD;  Location: [facility];  Service: Cardiovascular;  Laterality: Left;   LIVER BIOPSY     PANCREAS SURGERY  2006   partial pancreatectomy after endoscope knicked a part of pancreas.    PORTA CATH INSERTION N/A 04/10/2017   Procedure: PORTA CATH INSERTION;  Surgeon: Algernon Huxley, MD;  Location: [facility];  Service: Cardiovascular;  Laterality: N/A;   PORTA CATH INSERTION N/A 02/21/2019   Procedure: PORTA CATH INSERTION;  Surgeon: Algernon Huxley, MD;  Location: [facility];  Service:  Cardiovascular;  Laterality: N/A;   REPAIR OF ESOPHAGUS  2012   3 clamps placed in esophagus   ROBOTIC ASSISTED LAPAROSCOPIC VENTRAL/INCISIONAL HERNIA REPAIR N/A 03/15/2018   Procedure: ROBOTIC ASSISTED LAPAROSCOPIC VENTRAL HERNIA REPAIR;  Surgeon: Jules Husbands, MD;  Location: [facility] ORS;  Service: General;  Laterality: N/A;   SMALL BOWEL REPAIR     TONSILLECTOMY      Prior to Admission medications   Medication Sig Start Date End Date Taking? Authorizing Provider  aspirin EC 81 MG tablet Take 1 tablet (81 mg total) by mouth daily. Swallow whole. 08/15/20 08/15/21  Elodia Florence., MD  atorvastatin (LIPITOR) 40 MG tablet Take 1 tablet (40 mg total) by mouth daily. 08/16/20 09/15/20  Elodia Florence., MD  Calcium Carb-Cholecalciferol (CALCIUM 600/VITAMIN D3 PO) Take 1 tablet by mouth daily.    [provider]  Cholecalciferol (VITAMIN D-3) 5000 units TABS Take 5,000 Units by mouth daily.    [provider]  Continuous Blood Gluc Receiver (DEXCOM G6 RECEIVER) DEVI Use to monitor blood sugar. [facility] [id number] 08/30/19   [provider]  estradiol (ESTRACE) 0.1 MG/GM vaginal cream Place vaginally. 02/24/20    [provider]  furosemide (LASIX) 20 MG tablet Take 20 mg by mouth daily. 08/16/20   [provider]  gabapentin (NEURONTIN) 300 MG capsule Take 600 mg by mouth 3 (three) times daily. 08/30/19   [provider]  hydrochlorothiazide (HYDRODIURIL) 25 MG tablet Take by mouth. 04/24/20   [provider]  insulin lispro (HUMALOG) 100 UNIT/ML injection SMARTSIG:0-70 Unit(s) SUB-Q Daily 08/16/20   [provider]  lipase/protease/amylase (CREON) 36000 UNITS CPEP capsule Take 144,000 Units by mouth in the morning, at noon, in the evening, and at bedtime.    [provider]  lisinopril (ZESTRIL) 40 MG tablet Take 40 mg by mouth daily. 09/03/18   [provider]  Magnesium 250 MG TABS Take 250 mg by mouth daily.     [provider]  metoprolol succinate (TOPROL-XL) 25 MG 24 hr tablet Take 25 mg by mouth daily.    [provider]  nitroGLYCERIN (NITROSTAT) 0.3 MG SL tablet Place 0.3 mg under the tongue every 5 (five) minutes as needed for chest pain.    [provider]  vitamin E 400 UNIT capsule Take 400 Units by mouth daily.    [provider]    Allergies Ciprofloxacin, Demerol [meperidine], Dilaudid [hydromorphone hcl], Metoclopramide, Morphine and related, Morpholine salicylate, Isosorbide nitrate, Sulfa antibiotics, Flagyl [metronidazole], Adhesive [tape], Darvon [propoxyphene], Flexeril [cyclobenzaprine], Levaquin [levofloxacin in d5w], Naproxen, Soma [carisoprodol], and Zofran [ondansetron hcl]  Family History  Problem Relation Age of Onset   Cirrhosis Mother    Colon cancer Mother 49       TAH/BSO in ~68; deceased at 39   Hypertension Mother    Breast cancer Mother 52       unconfirmed   Hypertension Father    Prostate cancer Father 86       currently 30   Other Father        liver disease   Breast cancer Sister    Non-Hodgkin's lymphoma Daughter        unconfirmed; currently 9   Breast cancer  Maternal Aunt        age at dx unk.; currently 105   Cervical cancer Maternal Aunt    Cancer Maternal Aunt        hematologic malignancy; deceased 106   Cancer Maternal Uncle  unk. type; currently 79s   Stomach cancer Paternal Aunt 65       deceased at 36   Breast cancer Maternal Grandmother 48       possibly bilateral in 57s; deceased 18    Social History Social History   Tobacco Use   Smoking status: Never   Smokeless tobacco: Never  Vaping Use   Vaping Use: Never used  Substance Use Topics   Alcohol use: Not Currently    Comment: rarely   Drug use: No    Review of Systems  Review of Systems  Constitutional:  Negative for chills and fever.  HENT:  Negative for sore throat.   Eyes:  Negative for pain.  Respiratory:  Negative for cough and stridor.   Cardiovascular:  Positive for chest pain.  Gastrointestinal:  Negative for vomiting.  Genitourinary:  Negative for dysuria.  Musculoskeletal:  Negative for myalgias.  Skin:  Negative for rash.  Neurological:  Positive for headaches. Negative for seizures and loss of consciousness.  Psychiatric/Behavioral:  Negative for suicidal ideas.   All other systems reviewed and are negative.   Left tongue slightly deviated left which patient states is chronic.  Cranial nerves II through XII are otherwise grossly intact.  There is no pronator drift or finger dysmetria.  Patient is alert and oriented x4.  She has full strength and sensation throughout all extremities. ____________________________________________   PHYSICAL EXAM:  VITAL SIGNS: ED Triage Vitals [11/10/20 1322]  Enc Vitals Group     BP (!) 193/101     Pulse Rate 86     Resp 18     Temp 98.4 F (36.9 C)     Temp Source Oral     SpO2 96 %     Weight      Height      Head Circumference      Peak Flow      Pain Score      Pain Loc      Pain Edu?      Excl. in [place]?    Vitals:   11/10/20 1639 11/10/20 1803  BP: (!) 186/94 (!) 180/102  Pulse: 76 81   Resp: 18 18  Temp:    SpO2: 98% 99%   Physical Exam Vitals and nursing note reviewed.  Constitutional:      General: She is not in acute distress.    Appearance: She is well-developed.  HENT:     Head: Normocephalic and atraumatic.     Right Ear: External ear normal.     Left Ear: External ear normal.     Nose: Nose normal.     Mouth/Throat:     Mouth: Mucous membranes are moist.  Eyes:     Conjunctiva/sclera: Conjunctivae normal.  Cardiovascular:     Rate and Rhythm: Normal rate and regular rhythm.     Heart sounds: No murmur heard. Pulmonary:     Effort: Pulmonary effort is normal. No respiratory distress.     Breath sounds: Normal breath sounds.  Abdominal:     Palpations: Abdomen is soft.     Tenderness: There is no abdominal tenderness.  Musculoskeletal:     Cervical back: Neck supple.  Skin:    General: Skin is warm and dry.     Capillary Refill: Capillary refill takes less than 2 seconds.  Neurological:     Mental Status: She is alert and oriented to person, place, and time.     Motor: No weakness.  Gait: Gait normal.  Psychiatric:        Mood and Affect: Mood normal.    Cranial nerves II through XII grossly intact w/ exception of L tongue deviation which pt states is baseline.  No pronator drift.  No finger dysmetria.  Symmetric 5/5 strength of all extremities.  Sensation intact to light touch in all extremities.  Unremarkable unassisted gait.  ____________________________________________   LABS (all labs ordered are listed, but only abnormal results are displayed)  Labs Reviewed  BASIC METABOLIC PANEL - Abnormal; Notable for the following components:      Result Value   Potassium 3.2 (*)    Calcium 8.4 (*)    All other components within normal limits  CBC  TROPONIN I (HIGH SENSITIVITY)   ____________________________________________  EKG  ECG shows normal sinus rhythm with a ventricular rate of 78, normal axis, unremarkable intervals without  clear evidence of acute ischemia or significant arrhythmia. ____________________________________________  RADIOLOGY  ED MD interpretation: Chest no focal consolidation, effusion, edema, pneumothorax or other clear acute thoracic process.  CT head has no evidence of intracranial hemorrhage, mass-effect or other acute intracranial process.  Official radiology report(s): DG Chest 2 View  Result Date: 11/10/2020 CLINICAL DATA:  Left-sided chest pain EXAM: CHEST - 2 VIEW COMPARISON:  08/13/2020 FINDINGS: The heart size and mediastinal contours are within normal limits. Right chest port catheter. Both lungs are clear. The visualized skeletal structures are unremarkable. IMPRESSION: No acute abnormality of the lungs. Electronically Signed   By: Eddie Candle M.D.   On: 11/10/2020 14:37   CT HEAD WO CONTRAST (5MM)  Result Date: 11/10/2020 CLINICAL DATA:  Headache. Hypertension. EXAM: CT HEAD WITHOUT CONTRAST TECHNIQUE: Contiguous axial images were obtained from the base of the skull through the vertex without intravenous contrast. COMPARISON:  03/15/2020 FINDINGS: Brain: No evidence of acute infarction, hemorrhage, hydrocephalus, extra-axial collection or mass lesion/mass effect. Vascular: No hyperdense vessels. Skull: No skull fracture or bone lesions. Moderate hyperostosis frontalis interna. Sinuses/Orbits: The paranasal sinuses and mastoid air cells are clear. The globes are intact. Other: No scalp lesions or scalp hematoma. IMPRESSION: No acute intracranial findings or mass lesions. Electronically Signed   By: Marijo Sanes M.D.   On: 11/10/2020 18:05    ____________________________________________   PROCEDURES  Procedure(s) performed (including Critical Care):  Procedures   ____________________________________________   INITIAL IMPRESSION / ASSESSMENT AND PLAN / ED COURSE      Patient presents with above to history exam for assessment of headache and chest pain in the setting of high  blood pressures earlier today after running of her medicines yesterday.  She did take her blood pressure medicines prior to coming to emergency room.  On arrival she is hypertensive with BP of 193/1 1 with otherwise stable vital signs on room air.  On recheck BP is down to 186/94 prior to any interventions.  She does have a nonfocal exam.  Suspect likely symptoms related to hypertensive urgency.  There are no focal deficits to suggest CVA or evidence on CT of SAH.  ECG and nonelevated troponin not suggestive of ACS or cardiac ischemia.  I have a very low suspicion given overall description of chest discomfort as achiness for dissection at this time.  Chest x-ray has no evidence of pneumonia, pneumothorax, effusion edema or other acute thoracic process.  BMP remarkable for K of 3.3.  This was repleted.  CBC shows no leukocytosis or acute anemia.  CT head has no evidence of intracranial hemorrhage, mass-effect or  other acute intracranial process.  Patient stated she initially had headache and some chest discomfort after getting her head CT she states her symptoms are completely resolved.  Recheck her BP is 180/102.  Initial plan had been for migraine cocktail, patient states he longer wishes to get this done and she is absolutely no headache or chest pain and will follow-up with her cardiologist tomorrow.  Given she is now denying any symptoms with improvement in her blood pressure without intervention being that she took earlier may be starting to work given otherwise reassuring exam and CT labs EKG and chest x-ray I have low suspicion for other immediate life-threatening process.  I suspect symptoms likely related to some hypertensive urgency.  Do not believe she has evidence of endorgan damage at this time requiring further stay for emergent blood pressure management.  I think she stable for discharge with close outpatient PCP and cardiology follow-up.  Discharged stable condition.  Strict return  precautions advised and discussed.      ____________________________________________   FINAL CLINICAL IMPRESSION(S) / ED DIAGNOSES  Final diagnoses:  Chest pain, unspecified type  Hypertension, unspecified type  Hypokalemia  Acute nonintractable headache, unspecified headache type    Medications  potassium chloride SA (KLOR-CON) CR tablet 40 mEq (40 mEq Oral Given 11/10/20 1800)     ED Discharge Orders     None        Note:  This document was prepared using Dragon voice recognition software and may include unintentional dictation errors.    Lucrezia Starch, MD 11/10/20 [phone]

## 2020-11-10 NOTE — ED Triage Notes (Signed)
Pt comes into the ED via EMS from home with c/o chest pain with HTN over the past 15hrs, did not have her meds for the past 2-3 days. "The court had her meds" and just got her meds today and took them just Prior to EMS arrival   194/104 98%RA 85HR CBG90's

## 2020-11-13 ENCOUNTER — Ambulatory Visit: Payer: Medicare Other | Admitting: Obstetrics and Gynecology

## 2020-12-04 ENCOUNTER — Ambulatory Visit
Admission: RE | Admit: 2020-12-04 | Discharge: 2020-12-04 | Disposition: A | Discharge status: Home / Self Care | Payer: Medicare Other | Source: Ambulatory Visit | Attending: Vascular Surgery | Admitting: Vascular Surgery

## 2020-12-04 ENCOUNTER — Other Ambulatory Visit: Payer: Self-pay

## 2020-12-04 DIAGNOSIS — Z01812 Encounter for preprocedural laboratory examination: Secondary | ICD-10-CM | POA: Diagnosis not present

## 2020-12-04 MED ORDER — HEPARIN SOD (PORK) LOCK FLUSH 100 UNIT/ML IV SOLN
INTRAVENOUS | Status: AC
  Administered 2020-12-04: 500 [IU] via INTRAVENOUS
  Filled 2020-12-04: qty 5

## 2020-12-04 MED ORDER — SODIUM CHLORIDE FLUSH 0.9 % IV SOLN
INTRAVENOUS | Status: AC
  Filled 2020-12-04: qty 10

## 2020-12-04 MED ORDER — HEPARIN SOD (PORK) LOCK FLUSH 100 UNIT/ML IV SOLN: 500.0000 [IU] | Freq: Once | INTRAVENOUS | Status: AC

## 2020-12-04 MED ORDER — HEPARIN SODIUM (PORCINE) 5000 UNIT/ML IJ SOLN
INTRAMUSCULAR | Status: AC
  Filled 2020-12-04: qty 1

## 2020-12-11 ENCOUNTER — Ambulatory Visit: Payer: Medicare Other | Admitting: Obstetrics and Gynecology

## 2020-12-24 ENCOUNTER — Encounter: Payer: Self-pay | Admitting: Obstetrics and Gynecology

## 2020-12-24 ENCOUNTER — Other Ambulatory Visit: Payer: Self-pay

## 2020-12-24 ENCOUNTER — Ambulatory Visit (INDEPENDENT_AMBULATORY_CARE_PROVIDER_SITE_OTHER): Payer: Medicare Other | Admitting: Obstetrics and Gynecology

## 2020-12-24 ENCOUNTER — Other Ambulatory Visit (HOSPITAL_COMMUNITY)
Admission: RE | Admit: 2020-12-24 | Discharge: 2020-12-24 | Disposition: A | Discharge status: Home / Self Care | Payer: Medicare Other | Source: Ambulatory Visit | Attending: Obstetrics and Gynecology | Admitting: Obstetrics and Gynecology

## 2020-12-24 VITALS — BP 122/72 | Ht 64.0 in | Wt 161.0 lb

## 2020-12-24 DIAGNOSIS — R87620 Atypical squamous cells of undetermined significance on cytologic smear of vagina (ASC-US): Secondary | ICD-10-CM

## 2020-12-24 DIAGNOSIS — B3731 Acute candidiasis of vulva and vagina: Secondary | ICD-10-CM

## 2020-12-24 DIAGNOSIS — N941 Unspecified dyspareunia: Secondary | ICD-10-CM

## 2020-12-24 DIAGNOSIS — L280 Lichen simplex chronicus: Secondary | ICD-10-CM | POA: Diagnosis not present

## 2020-12-24 DIAGNOSIS — N952 Postmenopausal atrophic vaginitis: Secondary | ICD-10-CM

## 2020-12-24 MED ORDER — LIDOCAINE 5 % EX OINT: 1.0000 "application " | TOPICAL_OINTMENT | CUTANEOUS | 0 refills | Status: DC | PRN

## 2020-12-24 MED ORDER — FLUCONAZOLE 150 MG PO TABS
150.0000 mg | ORAL_TABLET | ORAL | 0 refills | Status: AC
Start: 2020-12-24 — End: 2021-06-11

## 2020-12-24 MED ORDER — ESTRADIOL 0.1 MG/GM VA CREA: 1.0000 g | TOPICAL_CREAM | VAGINAL | 2 refills | Status: DC

## 2020-12-24 MED ORDER — CLOBETASOL PROPIONATE 0.05 % EX OINT: TOPICAL_OINTMENT | CUTANEOUS | 5 refills | Status: AC

## 2020-12-24 NOTE — Patient Instructions (Signed)
Colposcopy, Care After The following information offers guidance on how to care for yourself after your procedure. Your doctor may also give you more specific instructions. If you have problems or questions, contact your doctor. What can I expect after the procedure? If you did not have a sample of your tissue taken out (did not have a biopsy), you may only have some spotting of blood for a few days. You can go back to your normal activities. If you had a sample of your tissue taken out, it is common to have: Soreness and mild pain. These may last for a few days. Mild bleeding or fluid (discharge) coming from your vagina. The fluid will look dark and grainy. You may have this for a few days. The fluid may be caused by a liquid that was used during your procedure. You may need to wear a sanitary pad. Spotting of blood for at least 48 hours after the procedure. Follow these instructions at home: Medicines Take over-the-counter and prescription medicines only as told by your doctor. Ask your doctor what over-the-counter pain medicines and prescription medicines you can start taking again. This is very important if you take blood thinners. Activity For at least 3 days, or for as long as told by your doctor, avoid: Douching. Using tampons. Having sex. Return to your normal activities as told by your doctor. Ask your doctor what activities are safe for you. General instructions Ask your doctor if you may take baths, swim, or use a hot tub. You may take showers. If you use birth control (contraception), keep using it. Keep all follow-up visits. Contact a doctor if: You have a fever or chills. You faint or feel light-headed. Get help right away if: You bleed a lot from your vagina. A lot of bleeding means that the bleeding soaks through a pad in less than 1 hour. You have clumps of blood (blood clots) coming from your vagina. You have signs that could mean you have an infection. This may be fluid  coming from your vagina that is: Different than normal. Yellow. Bad-smelling. You have very bad pain or cramps in your lower belly that do not get better with medicine. Summary If you did not have a sample of your tissue taken out, you may only have some spotting of blood for a few days. You can go back to your normal activities. If you had a sample of your tissue taken out, it is common to have mild pain for a few days and spotting for 48 hours. Avoid douching, using tampons, and having sex for at least 3 days after the procedure or for as long as told. Get help right away if you have a lot of bleeding, very bad pain, or signs of infection. This information is not intended to replace advice given to you by your health care provider. Make sure you discuss any questions you have with your health care provider. Document Revised: 06/28/2020 Document Reviewed: 06/28/2020 Elsevier Patient Education  [employer].

## 2020-12-24 NOTE — Progress Notes (Signed)
   GYNECOLOGY CLINIC COLPOSCOPY PROCEDURE NOTE  55 y.o. [id number] here for colposcopy for Ascus HPV negative pap, reports a history of cervical cancer Discussed underlying role for HPV infection in the development of cervical dysplasia, its natural history and progression/regression, need for surveillance.  Is the patient  pregnant: No LMP: No LMP recorded. Patient has had a hysterectomy. Smoking status:  reports that she has never smoked. She has never used smokeless tobacco. Contraception: none Future fertility desired:  No  Patient given informed consent, signed copy in the chart, time out was performed.  The patient was position in dorsal lithotomy position. Speculum was placed the cervix was visualized.   After application of acetic acid colposcopic inspection of the cervix was undertaken.   Colposcopy adequate, full visualization of Vaginal cuff : Yes no visible lesions; random biopsy along the vaginal cuff.  All specimens were labeled and sent to pathology.   Patient was given post procedure instructions.  Will follow up pathology and manage accordingly.  Routine preventative health maintenance measures emphasized.  Additionally today she reports a significant amount of distress in her marriage related to her pain with intercourse.  She has been applying vaginal estrogen without improvement in her symptoms.  She reports significant pain with penetration.  Exam suggestive of yeast infection.  Patient reports that she has had 3 or more yeast infections this year.  Recommended suppressive failure therapy with weekly Diflucan for the next 6 months.  She also endorses an itch scratch cycle symptom which is suggestive of lichen chronicus at this time given the appearance of the labia to be red and inflamed.  Provided the patient with information regarding lichen's chronicus and the importance of breaking the itch scratch cycle.  Will apply topical clobetasol daily and plan to follow-up with the  patient in 4 weeks.  Prescribed the patient lidocaine to use topically as needed for intercourse to see if this helps with improvement of symptoms.   Adrian Prows MD [facility] 12/24/2020 10:25 PM

## 2020-12-28 LAB — SURGICAL PATHOLOGY

## 2021-01-15 ENCOUNTER — Ambulatory Visit (INDEPENDENT_AMBULATORY_CARE_PROVIDER_SITE_OTHER): Payer: Medicare Other | Admitting: Obstetrics and Gynecology

## 2021-01-15 ENCOUNTER — Other Ambulatory Visit: Payer: Self-pay

## 2021-01-15 VITALS — BP 138/80 | Ht 65.0 in | Wt 159.8 lb

## 2021-01-15 DIAGNOSIS — N760 Acute vaginitis: Secondary | ICD-10-CM | POA: Diagnosis not present

## 2021-01-15 MED ORDER — AMOXICILLIN-POT CLAVULANATE 875-125 MG PO TABS: 1.0000 | ORAL_TABLET | Freq: Two times a day (BID) | ORAL | 0 refills | Status: AC

## 2021-01-15 NOTE — Patient Instructions (Signed)
Take Augmentin twice a day for 10 days Continue to apply Clobetasol ointment nightly

## 2021-01-15 NOTE — Progress Notes (Signed)
Patient ID: Amber Christensen, female   DOB: Sep 14, 1965, 55 y.o.   MRN: [id number]  Reason for Consult: Gynecologic Exam   Referred by [name], MD  Subjective:     HPI:  Amber Christensen is a 55 y.o. female. She is following up today  after colposcopy and several concerns.   Gynecological History  No LMP recorded. Patient has had a hysterectomy.  Past Medical History:  Diagnosis Date   Allergy    many many allergies   Anemia    in past   Anginal pain ([place]) 01/2018   cardiac workup clear   Anxiety    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[place])    pt unaware of this diagnosis   Bipolar disorder ([place])    Bladder mass    removed and had tbt. mesh removed   Blood transfusion without reported diagnosis 1999   received 20 units of blood after tearing esophagus from vomitting so severely after ERCP   Breast mass    left side biopsy was negative   Carpal tunnel syndrome    left hand   Cervical cancer ([place]) 2002   Chronic kidney disease    cyst on left kidney   Chronic pancreatitis ([place])    prior to pancreas surgery   Cirrhosis ([place])    Clotting disorder ([place])    pt states that sometimes she has difficulty stopping to bleed and other times it is okay   Depression    Diabetes mellitus without complication (HCC)    has insulin pump   Dyspnea    Esophagitis    GERD (gastroesophageal reflux disease)    Headache    migraines...takes compazine for this   Heart disease    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 left kidney mass.   Liver disease    NASH. has had liver biopsies. negative except for NASH   NASH (nonalcoholic steatohepatitis)    NASH (nonalcoholic steatohepatitis)    Pancreatitis    PONV (postoperative nausea and vomiting)    Stroke ([place]) 11/2017   had tia. no longer needs plavix   Thyroid disease    Family History   Problem Relation Age of Onset   Cirrhosis Mother    Colon cancer Mother 59       TAH/BSO in ~52; deceased at 45   Hypertension Mother    Breast cancer Mother 10       unconfirmed   Hypertension Father    Prostate cancer Father 82       currently 59   Other Father        liver disease   Breast cancer Sister    Non-Hodgkin's lymphoma Daughter        unconfirmed; currently 55   Breast cancer Maternal Aunt        age at dx unk.; currently 1   Cervical cancer Maternal Aunt    Cancer Maternal Aunt        hematologic malignancy; deceased 21   Cancer Maternal Uncle        unk. type; currently 71s   Stomach cancer Paternal Aunt 14       deceased at 70   Breast cancer Maternal Grandmother 71       possibly bilateral in 54s; deceased 65   Past Surgical History:  Procedure Laterality Date   ABDOMINAL HYSTERECTOMY     APPENDECTOMY     BLADDER SURGERY     mesh removed. revision which has caused everything to fall again   BOWEL RESECTION     took a piece of bowel out after pancreatectomy caused problem with bowel.    BREAST BIOPSY Left 1989   benign   CARDIAC CATHETERIZATION  3557,3220, 1998   no stents, fatty streaks   CHOLECYSTECTOMY     COLON SURGERY     COLONOSCOPY WITH PROPOFOL N/A 10/08/2018   Procedure: COLONOSCOPY WITH PROPOFOL;  Surgeon: Lollie Sails, MD;  Location[facility]COPY;  Service: Endoscopy;  Laterality: N/A;   CYST REMOVAL HAND Left 1992   Ganglion cyst removed from Left Wrist   CYSTECTOMY     CYSTO WITH HYDRODISTENSION N/A 03/04/2019   Procedure: CYSTOSCOPY/HYDRODISTENSION;  Surgeon: Bjorn Loser, MD;  Location: [facility] ORS;  Service: Urology;  Laterality: N/A;   DILATATION & CURETTAGE/HYSTEROSCOPY WITH MYOSURE     DILATION AND CURETTAGE OF UTERUS     ERCP     ESOPHAGOGASTRODUODENOSCOPY     HERNIA REPAIR     KNEE ARTHROSCOPY Left 1992   LEFT HEART CATH AND CORONARY ANGIOGRAPHY Left 02/06/2019   Procedure: LEFT HEART CATH AND CORONARY ANGIOGRAPHY;   Surgeon: Yolonda Kida, MD;  Location: [place] CV LAB;  Service: Cardiovascular;  Laterality: Left;   LIVER BIOPSY     PANCREAS SURGERY  2006   partial pancreatectomy after endoscope knicked a part of pancreas.    PORTA CATH INSERTION N/A 04/10/2017   Procedure: PORTA CATH INSERTION;  Surgeon: Algernon Huxley, MD;  Location: [place] CV LAB;  Service: Cardiovascular;  Laterality: N/A;   PORTA CATH INSERTION N/A 02/21/2019   Procedure: PORTA CATH INSERTION;  Surgeon: Algernon Huxley, MD;  Location: [place] [facility];  Service: Cardiovascular;  Laterality: N/A;   REPAIR OF ESOPHAGUS  2012   3 clamps placed in esophagus   ROBOTIC ASSISTED LAPAROSCOPIC VENTRAL/INCISIONAL HERNIA REPAIR N/A 03/15/2018   Procedure: ROBOTIC ASSISTED LAPAROSCOPIC VENTRAL HERNIA REPAIR;  Surgeon: Jules Husbands, MD;  Location: [facility] ORS;  Service: General;  Laterality: N/A;   SMALL BOWEL REPAIR     TONSILLECTOMY      Short Social History:  Social History   Tobacco Use   Smoking status: Never   Smokeless tobacco: Never  Substance Use Topics   Alcohol use: Not Currently    Comment: rarely    Allergies  Allergen Reactions   Ciprofloxacin     Rash, nausea vomitting   Demerol [Meperidine] Hives and Other (See Comments)    Pt states that this medication causes cardiac arrest.     Dilaudid [Hydromorphone Hcl] Nausea And Vomiting and Other (See Comments)    Pt states that this medication causes cardiac arrest.     Metoclopramide Hives    Breathing issues   Morphine And Related Anaphylaxis and Other (See Comments)    Pt states that this medication causes cardiac arrest   Morpholine Salicylate Nausea And Vomiting, Other (See Comments) and Rash    CHF   Isosorbide Nitrate Other (See Comments)    Headache    Sulfa Antibiotics Nausea And Vomiting   Flagyl [Metronidazole]     Rash, heart racing, nausea vomitting   Adhesive [Tape] Rash    Blisters skin Paper tape is okay   Darvon [Propoxyphene]  Nausea And Vomiting and Rash   Flexeril [Cyclobenzaprine] Nausea And Vomiting and Rash  Levaquin [Levofloxacin In D5w] Nausea And Vomiting and Rash   Naproxen Rash    Ibuprofen and advil are not a problem   Soma [Carisoprodol] Nausea And Vomiting and Rash   Zofran [Ondansetron Hcl] Nausea And Vomiting and Rash    Current Outpatient Medications  Medication Sig Dispense Refill   aspirin EC 81 MG tablet Take 1 tablet (81 mg total) by mouth daily. Swallow whole. 30 tablet 1   Calcium Carb-Cholecalciferol (CALCIUM 600/VITAMIN D3 PO) Take 1 tablet by mouth daily.     Cholecalciferol (VITAMIN D-3) 5000 units TABS Take 5,000 Units by mouth daily.     clobetasol ointment (TEMOVATE) 0.05 % Apply to affected area every night for 4 weeks, then every other day for 4 weeks and then twice a week for 4 weeks or until resolution. 30 g 5   Continuous Blood Gluc Receiver (DEXCOM G6 RECEIVER) DEVI Use to monitor blood sugar. [place] [phone]     estradiol (ESTRACE) 0.1 MG/GM vaginal cream Place 1 g vaginally 3 (three) times a week. 42.5 g 2   fluconazole (DIFLUCAN) 150 MG tablet Take 1 tablet (150 mg total) by mouth once a week for 25 doses. 25 tablet 0   furosemide (LASIX) 20 MG tablet Take 20 mg by mouth daily.     gabapentin (NEURONTIN) 300 MG capsule Take 600 mg by mouth 3 (three) times daily.     hydrochlorothiazide (HYDRODIURIL) 25 MG tablet Take by mouth.     insulin lispro (HUMALOG) 100 UNIT/ML injection SMARTSIG:0-70 Unit(s) SUB-Q Daily     lidocaine (XYLOCAINE) 5 % ointment Apply 1 application topically as needed. 35.44 g 0   lipase/protease/amylase (CREON) 36000 UNITS CPEP capsule Take 144,000 Units by mouth in the morning, at noon, in the evening, and at bedtime.     lisinopril (ZESTRIL) 40 MG tablet Take 40 mg by mouth daily.     Magnesium 250 MG TABS Take 250 mg by mouth daily.      metoprolol succinate (TOPROL-XL) 25 MG 24 hr tablet Take 25 mg by mouth daily.     nitroGLYCERIN (NITROSTAT) 0.3  MG SL tablet Place 0.3 mg under the tongue every 5 (five) minutes as needed for chest pain.     vitamin E 400 UNIT capsule Take 400 Units by mouth daily.     amoxicillin-clavulanate (AUGMENTIN) 875-125 MG tablet Take 1 tablet by mouth 2 (two) times daily for 10 days. 20 tablet 0   atorvastatin (LIPITOR) 40 MG tablet Take 1 tablet (40 mg total) by mouth daily. 30 tablet 0   No current facility-administered medications for this visit.    Review of Systems  Constitutional: Negative for chills, fatigue, fever and unexpected weight change.  HENT: Negative for trouble swallowing.  Eyes: Negative for loss of vision.  Respiratory: Negative for cough, shortness of breath and wheezing.  Cardiovascular: Negative for chest pain, leg swelling, palpitations and syncope.  GI: Negative for abdominal pain, blood in stool, diarrhea, nausea and vomiting.  GU: Negative for difficulty urinating, dysuria, frequency and hematuria.  Musculoskeletal: Negative for back pain, leg pain and joint pain.  Skin: Negative for rash.  Neurological: Negative for dizziness, headaches, light-headedness, numbness and seizures.  Psychiatric: Negative for behavioral problem, confusion, depressed mood and sleep disturbance.       Objective:  Objective   Vitals:   01/15/21 0939  BP: 138/80  Weight: 159 lb 12.8 oz (72.5 kg)  Height: 5' 5"  (1.651 m)   Body mass index is 26.59 kg/m.  Physical Exam Vitals and nursing note reviewed. Exam conducted with a chaperone present.  Constitutional:      Appearance: Normal appearance.  HENT:     Head: Normocephalic and atraumatic.  Eyes:     Extraocular Movements: Extraocular movements intact.     Pupils: Pupils are equal, round, and reactive to light.  Cardiovascular:     Rate and Rhythm: Normal rate and regular rhythm.  Pulmonary:     Effort: Pulmonary effort is normal.     Breath sounds: Normal breath sounds.  Abdominal:     General: Abdomen is flat.     Palpations:  Abdomen is soft.  Musculoskeletal:     Cervical back: Normal range of motion.  Skin:    General: Skin is warm and dry.  Neurological:     General: No focal deficit present.     Mental Status: She is alert and oriented to person, place, and time.  Psychiatric:        Behavior: Behavior normal.        Thought Content: Thought content normal.        Judgment: Judgment normal.   Assessment/Plan:     55 yo with vaginitis Will treat with Augmentin- rx sent.  BV and candida swab sent  More than 10 minutes were spent face to face with the patient in the room, reviewing the medical record, labs and images, and coordinating care for the patient. The plan of management was discussed in detail and counseling was provided.    Adrian Prows MD [facility], [facility] 01/15/2021 10:03 AM

## 2021-01-19 LAB — NUSWAB BV AND CANDIDA, NAA
Candida albicans, NAA: NEGATIVE
Candida glabrata, NAA: NEGATIVE

## 2021-02-12 ENCOUNTER — Ambulatory Visit: Payer: Medicare Other | Admitting: Obstetrics and Gynecology

## 2021-03-25 ENCOUNTER — Ambulatory Visit: Payer: Medicare Other | Admitting: Obstetrics and Gynecology

## 2021-05-03 ENCOUNTER — Ambulatory Visit
Admission: RE | Admit: 2021-05-03 | Discharge: 2021-05-03 | Disposition: A | Discharge status: Home / Self Care | Payer: Medicare Other | Source: Ambulatory Visit | Attending: Family Medicine | Admitting: Family Medicine

## 2021-05-03 ENCOUNTER — Other Ambulatory Visit: Payer: Self-pay

## 2021-05-03 DIAGNOSIS — Z452 Encounter for adjustment and management of vascular access device: Secondary | ICD-10-CM | POA: Diagnosis present

## 2021-05-03 MED ORDER — HEPARIN SOD (PORK) LOCK FLUSH 100 UNIT/ML IV SOLN: 500.0000 [IU] | INTRAVENOUS | Status: AC | PRN

## 2021-05-03 MED ORDER — HEPARIN SOD (PORK) LOCK FLUSH 100 UNIT/ML IV SOLN
INTRAVENOUS | Status: AC
  Administered 2021-05-03: 500 [IU]
  Filled 2021-05-03: qty 5

## 2021-07-28 ENCOUNTER — Ambulatory Visit
Admission: RE | Admit: 2021-07-28 | Discharge: 2021-07-28 | Disposition: A | Discharge status: Home / Self Care | Payer: Medicare Other | Source: Ambulatory Visit | Attending: Vascular Surgery | Admitting: Vascular Surgery

## 2021-07-28 DIAGNOSIS — Z452 Encounter for adjustment and management of vascular access device: Secondary | ICD-10-CM | POA: Diagnosis present

## 2021-07-28 MED ORDER — HEPARIN SOD (PORK) LOCK FLUSH 100 UNIT/ML IV SOLN
INTRAVENOUS | Status: AC
  Administered 2021-07-28: 500 [IU]
  Filled 2021-07-28: qty 5

## 2021-07-28 NOTE — Progress Notes (Signed)
Patient to sds for port flush; no complaints at this time. Port accessed per policy and procedure; saline and heparin administered per protocol. Blood return noted during flush procedure. Deaccess performed. Thin gauze placed with tegaderm per patient request.  No drainage noted. Patient states she will follow up with Dr. Ginette Pitman for new orders d/t current order will expire after 12/05/21. Patient ambulatory to lobby.

## 2021-09-20 ENCOUNTER — Other Ambulatory Visit (HOSPITAL_COMMUNITY): Payer: Self-pay | Admitting: Student

## 2021-09-20 ENCOUNTER — Other Ambulatory Visit: Payer: Self-pay | Admitting: Student

## 2021-09-20 DIAGNOSIS — G8929 Other chronic pain: Secondary | ICD-10-CM

## 2021-09-28 ENCOUNTER — Ambulatory Visit: Admission: RE | Admit: 2021-09-28 | Payer: Medicare Other | Source: Ambulatory Visit

## 2021-09-29 ENCOUNTER — Ambulatory Visit
Admission: RE | Admit: 2021-09-29 | Discharge: 2021-09-29 | Disposition: A | Discharge status: Home / Self Care | Payer: Medicare Other | Source: Ambulatory Visit | Attending: Student | Admitting: Student

## 2021-09-29 DIAGNOSIS — G8929 Other chronic pain: Secondary | ICD-10-CM | POA: Insufficient documentation

## 2021-09-29 DIAGNOSIS — R519 Headache, unspecified: Secondary | ICD-10-CM | POA: Insufficient documentation

## 2021-09-29 MED ORDER — GADOBUTROL 1 MMOL/ML IV SOLN
7.5000 mL | Freq: Once | INTRAVENOUS | Status: AC | PRN
  Administered 2021-09-29: 7.5 mL via INTRAVENOUS

## 2021-12-13 ENCOUNTER — Encounter (INDEPENDENT_AMBULATORY_CARE_PROVIDER_SITE_OTHER): Payer: Self-pay

## 2022-03-15 ENCOUNTER — Encounter (HOSPITAL_COMMUNITY): Payer: Self-pay | Admitting: Emergency Medicine

## 2022-03-15 ENCOUNTER — Emergency Department (HOSPITAL_COMMUNITY): Payer: 59

## 2022-03-15 ENCOUNTER — Other Ambulatory Visit: Payer: Self-pay

## 2022-03-15 ENCOUNTER — Emergency Department (HOSPITAL_COMMUNITY)
Admission: EM | Admit: 2022-03-15 | Discharge: 2022-03-15 | Disposition: A | Discharge status: Home / Self Care | Payer: 59 | Attending: Emergency Medicine | Admitting: Emergency Medicine

## 2022-03-15 DIAGNOSIS — E1122 Type 2 diabetes mellitus with diabetic chronic kidney disease: Secondary | ICD-10-CM | POA: Insufficient documentation

## 2022-03-15 DIAGNOSIS — M546 Pain in thoracic spine: Secondary | ICD-10-CM | POA: Insufficient documentation

## 2022-03-15 DIAGNOSIS — Z794 Long term (current) use of insulin: Secondary | ICD-10-CM | POA: Diagnosis not present

## 2022-03-15 DIAGNOSIS — Z8673 Personal history of transient ischemic attack (TIA), and cerebral infarction without residual deficits: Secondary | ICD-10-CM | POA: Insufficient documentation

## 2022-03-15 DIAGNOSIS — Y9301 Activity, walking, marching and hiking: Secondary | ICD-10-CM | POA: Diagnosis not present

## 2022-03-15 DIAGNOSIS — S0990XA Unspecified injury of head, initial encounter: Secondary | ICD-10-CM

## 2022-03-15 DIAGNOSIS — M7918 Myalgia, other site: Secondary | ICD-10-CM

## 2022-03-15 DIAGNOSIS — M542 Cervicalgia: Secondary | ICD-10-CM | POA: Insufficient documentation

## 2022-03-15 DIAGNOSIS — N189 Chronic kidney disease, unspecified: Secondary | ICD-10-CM | POA: Diagnosis not present

## 2022-03-15 DIAGNOSIS — W109XXA Fall (on) (from) unspecified stairs and steps, initial encounter: Secondary | ICD-10-CM | POA: Diagnosis not present

## 2022-03-15 DIAGNOSIS — W19XXXA Unspecified fall, initial encounter: Secondary | ICD-10-CM

## 2022-03-15 DIAGNOSIS — S300XXA Contusion of lower back and pelvis, initial encounter: Secondary | ICD-10-CM | POA: Insufficient documentation

## 2022-03-15 DIAGNOSIS — R29898 Other symptoms and signs involving the musculoskeletal system: Secondary | ICD-10-CM | POA: Diagnosis present

## 2022-03-15 DIAGNOSIS — Z79899 Other long term (current) drug therapy: Secondary | ICD-10-CM | POA: Diagnosis not present

## 2022-03-15 DIAGNOSIS — I129 Hypertensive chronic kidney disease with stage 1 through stage 4 chronic kidney disease, or unspecified chronic kidney disease: Secondary | ICD-10-CM | POA: Insufficient documentation

## 2022-03-15 MED ORDER — HYDROCODONE-ACETAMINOPHEN 5-325 MG PO TABS: 1.0000 | ORAL_TABLET | Freq: Four times a day (QID) | ORAL | 0 refills | Status: DC | PRN

## 2022-03-15 NOTE — ED Triage Notes (Signed)
Patient sent by [facility] for a fall. Patient states she slipped off her deck falling down 6 steps striking her back and head. Denies LOC. No blood thinners. Scattered bruising to back noted. Pt ambulatory to triage.

## 2022-03-15 NOTE — ED Notes (Signed)
Pt states with high blood pressure and admits to not taking her HTN meds this morning due to the fall.  She went to UC close to her home.

## 2022-03-15 NOTE — ED Notes (Signed)
Pt verbalized understanding of no driving and to use caution within 4 hours of taking pain meds due to meds cause drowsiness 

## 2022-03-15 NOTE — ED Notes (Signed)
Pt returned from xray

## 2022-03-15 NOTE — ED Provider Notes (Signed)
[facility] Provider Note   CSN: [id number] Arrival date & time: 03/15/22  1053     History  Chief Complaint  Patient presents with   Amber Christensen    Amber Christensen is a 57 y.o. female with a history significant for hypertension, GERD, chronic kidney disease, diabetes, NASH, hemophilia and CVA in 2022 with residual mild left lower extremity weakness requiring ambulation with a cane presenting for evaluation of a fall which occurred this morning.  She was walking down her outside steps not holding onto the railing or using her cane as she was taking out the trash when she slipped and fell, describing slipping down 6 steps and striking her buttock back, neck and head during this fall.  She denies LOC for this event.  She [facility] but sent here for imaging.  She reports she has bruising on her back and buttocks.  Denies dizziness, nausea or vomiting.  Reports mild headache.  Denies any acute weakness in her extremities.  The history is provided by the patient.       Home Medications Prior to Admission medications   Medication Sig Start Date End Date Taking? Authorizing Provider  HYDROcodone-acetaminophen (NORCO/VICODIN) 5-325 MG tablet Take 1 tablet by mouth every 6 (six) hours as needed. 03/15/22  Yes Adraine Biffle, Almyra Free, PA-C  atorvastatin (LIPITOR) 40 MG tablet Take 1 tablet (40 mg total) by mouth daily. 08/16/20 09/15/20  Elodia Florence., MD  Calcium Carb-Cholecalciferol (CALCIUM 600/VITAMIN D3 PO) Take 1 tablet by mouth daily.    [provider]  Cholecalciferol (VITAMIN D-3) 5000 units TABS Take 5,000 Units by mouth daily.    [provider]  clobetasol ointment (TEMOVATE) 0.05 % Apply to affected area every night for 4 weeks, then every other day for 4 weeks and then twice a week for 4 weeks or until resolution. 12/24/20   Schuman, Stefanie Libel, MD  Continuous Blood Gluc Receiver (DEXCOM G6 RECEIVER) DEVI  Use to monitor blood sugar. [facility] [id number] 08/30/19   [provider]  estradiol (ESTRACE) 0.1 MG/GM vaginal cream Place 1 g vaginally 3 (three) times a week. 12/25/20   Schuman, Stefanie Libel, MD  furosemide (LASIX) 20 MG tablet Take 20 mg by mouth daily. 08/16/20   [provider]  gabapentin (NEURONTIN) 300 MG capsule Take 600 mg by mouth 3 (three) times daily. 08/30/19   [provider]  hydrochlorothiazide (HYDRODIURIL) 25 MG tablet Take by mouth. 04/24/20   [provider]  insulin lispro (HUMALOG) 100 UNIT/ML injection SMARTSIG:0-70 Unit(s) SUB-Q Daily 08/16/20   [provider]  lidocaine (XYLOCAINE) 5 % ointment Apply 1 application topically as needed. 12/24/20   Schuman, Stefanie Libel, MD  lipase/protease/amylase (CREON) 36000 UNITS CPEP capsule Take 144,000 Units by mouth in the morning, at noon, in the evening, and at bedtime.    [provider]  lisinopril (ZESTRIL) 40 MG tablet Take 40 mg by mouth daily. 09/03/18   [provider]  Magnesium 250 MG TABS Take 250 mg by mouth daily.     [provider]  metoprolol succinate (TOPROL-XL) 25 MG 24 hr tablet Take 25 mg by mouth daily.    [provider]  nitroGLYCERIN (NITROSTAT) 0.3 MG SL tablet Place 0.3 mg under the tongue every 5 (five) minutes as needed for chest pain.    [provider]  vitamin E 400 UNIT capsule Take 400 Units by mouth daily.  [provider]      Allergies    Ciprofloxacin, Demerol [meperidine], Dilaudid [hydromorphone hcl], Metoclopramide, Morphine and related, Morpholine salicylate, Isosorbide nitrate, Sulfa antibiotics, Flagyl [metronidazole], Adhesive [tape], Darvon [propoxyphene], Flexeril [cyclobenzaprine], Levaquin [levofloxacin in d5w], Naproxen, Soma [carisoprodol], and Zofran [ondansetron hcl]    Review of Systems   Review of Systems  Constitutional:  Negative for chills and fever.  HENT:  Negative for  congestion and sore throat.   Eyes: Negative.   Respiratory:  Negative for chest tightness and shortness of breath.   Cardiovascular:  Negative for chest pain.  Gastrointestinal:  Negative for abdominal pain, nausea and vomiting.  Genitourinary: Negative.   Musculoskeletal:  Positive for arthralgias and neck pain. Negative for joint swelling.  Skin:  Positive for color change. Negative for rash and wound.  Neurological:  Positive for headaches. Negative for dizziness, weakness, light-headedness and numbness.  Psychiatric/Behavioral: Negative.    All other systems reviewed and are negative.   Physical Exam Updated Vital Signs BP (!) 164/88 (BP Location: Right Arm)   Pulse 81   Temp 98.5 F (36.9 C) (Oral)   Resp 16   Ht '5\' 5"'$  (1.651 m)   Wt 70.3 kg   SpO2 100%   BMI 25.79 kg/m  Physical Exam Vitals and nursing note reviewed.  Constitutional:      Appearance: She is well-developed.     Comments: No acute distress.  Walks slowly but with normal gait.  HENT:     Head: Normocephalic and atraumatic.     Right Ear: Tympanic membrane normal.     Left Ear: Tympanic membrane normal.  Eyes:     Extraocular Movements: Extraocular movements intact.     Conjunctiva/sclera: Conjunctivae normal.     Pupils: Pupils are equal, round, and reactive to light.  Cardiovascular:     Rate and Rhythm: Normal rate and regular rhythm.     Heart sounds: Normal heart sounds.  Pulmonary:     Effort: Pulmonary effort is normal.     Breath sounds: Normal breath sounds. No wheezing.  Abdominal:     General: Bowel sounds are normal.     Palpations: Abdomen is soft.     Tenderness: There is no abdominal tenderness.  Musculoskeletal:        General: Normal range of motion.     Cervical back: Normal range of motion and neck supple. Bony tenderness present. No swelling or deformity.     Thoracic back: Bony tenderness present.     Lumbar back: Bony tenderness present.       Legs:  Skin:    General:  Skin is warm and dry.     Findings: No rash.  Neurological:     General: No focal deficit present.     Mental Status: She is alert and oriented to person, place, and time.     GCS: GCS eye subscore is 4. GCS verbal subscore is 5. GCS motor subscore is 6.     Cranial Nerves: No cranial nerve deficit.     Sensory: No sensory deficit.     Coordination: Coordination normal.     Gait: Gait normal.     Deep Tendon Reflexes: Reflexes normal.     Comments: Normal heel-shin, normal rapid alternating movements. Cranial nerves III-XII intact.  No pronator drift.  Psychiatric:        Speech: Speech normal.        Behavior: Behavior normal.        Thought Content: Thought  content normal.     ED Results / Procedures / Treatments   Labs (all labs ordered are listed, but only abnormal results are displayed) Labs Reviewed - No data to display  EKG None  Radiology DG Thoracic Spine 2 View  Result Date: 03/15/2022 CLINICAL DATA:  Fall. EXAM: THORACIC SPINE 2 VIEWS COMPARISON:  None Available. FINDINGS: There is no evidence of thoracic spine fracture. Alignment is normal. No other significant bone abnormalities are identified. IMPRESSION: Negative. Electronically Signed   By: Marijo Conception M.D.   On: 03/15/2022 13:19   DG Lumbar Spine Complete  Result Date: 03/15/2022 CLINICAL DATA:  Low back pain after fall. EXAM: LUMBAR SPINE - COMPLETE 4+ VIEW COMPARISON:  None Available. FINDINGS: There is no evidence of lumbar spine fracture. Alignment is normal. Intervertebral disc spaces are maintained. IMPRESSION: Negative. Electronically Signed   By: Marijo Conception M.D.   On: 03/15/2022 13:18   DG Hips Bilat W or Wo Pelvis 3-4 Views  Result Date: 03/15/2022 CLINICAL DATA:  Hip pain after fall. EXAM: DG HIP (WITH OR WITHOUT PELVIS) 3-4V BILAT COMPARISON:  January 26, 2022. FINDINGS: There is no evidence of hip fracture or dislocation. There is no evidence of arthropathy or other focal bone abnormality.  IMPRESSION: Negative. Electronically Signed   By: Marijo Conception M.D.   On: 03/15/2022 13:17   DG Cervical Spine Complete  Result Date: 03/15/2022 CLINICAL DATA:  Fall. EXAM: CERVICAL SPINE - COMPLETE 4+ VIEW COMPARISON:  None Available. FINDINGS: There is no evidence of cervical spine fracture or prevertebral soft tissue swelling. Alignment is normal. Minimal degenerative disc disease is noted at C6-7. IMPRESSION: No acute abnormality seen. Electronically Signed   By: Marijo Conception M.D.   On: 03/15/2022 13:16   CT Head Wo Contrast  Result Date: 03/15/2022 CLINICAL DATA:  Trauma EXAM: CT HEAD WITHOUT CONTRAST TECHNIQUE: Contiguous axial images were obtained from the base of the skull through the vertex without intravenous contrast. RADIATION DOSE REDUCTION: This exam was performed according to the departmental dose-optimization program which includes automated exposure control, adjustment of the mA and/or kV according to patient size and/or use of iterative reconstruction technique. COMPARISON:  MRI Brain 09/29/21 FINDINGS: Brain: No evidence of acute infarction, hemorrhage, hydrocephalus, extra-axial collection or mass lesion/mass effect. Extensive periventricular and subcortical hypodensity, favored to represent sequela of chronic microvascular ischemic change Vascular: No hyperdense vessel or unexpected calcification. Skull: Normal. Negative for fracture or focal lesion. Sinuses/Orbits: No mastoid or middle ear effusion. Paranasal sinuses are grossly clear. Orbits are unremarkable. Other: None IMPRESSION: No CT evidence of intracranial injury. Electronically Signed   By: Marin Roberts M.D.   On: 03/15/2022 13:12    Procedures Procedures    Medications Ordered in ED Medications - No data to display  ED Course/ Medical Decision Making/ A&P                             Medical Decision Making Pt presenting with a slip and fall down her steps, landing with her back and head against the steps.   No loc or neuro deficit since the fall, multiple complaints of pain,  ddx including fracture, dislocation, soft tissue injury/contusion, acute brain injury.    Amount and/or Complexity of Data Reviewed Radiology: ordered and independent interpretation performed.    Details: Reviewed and negative for acute injury, agree with interpretation.  Risk Prescription drug management.  Final Clinical Impression(s) / ED Diagnoses Final diagnoses:  Fall, initial encounter  Musculoskeletal pain  Injury of head, initial encounter    Rx / DC Orders ED Discharge Orders          Ordered    HYDROcodone-acetaminophen (NORCO/VICODIN) 5-325 MG tablet  Every 6 hours PRN        03/15/22 1439              Evalee Jefferson, PA-C 03/15/22 1453    Godfrey Pick, MD 03/19/22 [phone]

## 2022-03-15 NOTE — Discharge Instructions (Addendum)
I recommend ice packs as much is as comfortable for the next 2 days for any areas of increased pain, on day 3 you can add a little bit of heat for 20 minutes several times daily.  Plan to see your doctor for recheck if your symptoms are not improving over the next week.  You may take the hydrocodone prescribed for pain relief.  This will make you drowsy - do not drive within 4 hours of taking this medication.

## 2022-04-26 ENCOUNTER — Other Ambulatory Visit: Payer: Self-pay | Admitting: Internal Medicine

## 2022-04-26 DIAGNOSIS — R0602 Shortness of breath: Secondary | ICD-10-CM

## 2022-04-26 DIAGNOSIS — R079 Chest pain, unspecified: Secondary | ICD-10-CM

## 2022-05-03 ENCOUNTER — Encounter
Admission: RE | Admit: 2022-05-03 | Discharge: 2022-05-03 | Disposition: A | Discharge status: Home / Self Care | Payer: 59 | Source: Ambulatory Visit | Attending: Internal Medicine | Admitting: Internal Medicine

## 2022-05-03 DIAGNOSIS — R0602 Shortness of breath: Secondary | ICD-10-CM | POA: Diagnosis present

## 2022-05-03 DIAGNOSIS — R079 Chest pain, unspecified: Secondary | ICD-10-CM | POA: Insufficient documentation

## 2022-05-03 MED ORDER — TECHNETIUM TC 99M TETROFOSMIN IV KIT
10.0000 | PACK | Freq: Once | INTRAVENOUS | Status: AC | PRN
  Administered 2022-05-03: 10.73 via INTRAVENOUS

## 2022-05-03 MED ORDER — REGADENOSON 0.4 MG/5ML IV SOLN
0.4000 mg | Freq: Once | INTRAVENOUS | Status: AC
  Administered 2022-05-03: 0.4 mg via INTRAVENOUS

## 2022-05-03 MED ORDER — TECHNETIUM TC 99M TETROFOSMIN IV KIT
31.7000 | PACK | Freq: Once | INTRAVENOUS | Status: AC | PRN
  Administered 2022-05-03: 31.7 via INTRAVENOUS

## 2022-05-05 LAB — NM MYOCAR MULTI W/SPECT W/WALL MOTION / EF
Base ST Depression (mm): 0 mm
Estimated workload: 1
Exercise duration (min): 1 min
LV dias vol: 45 mL (ref 46–106)
LV sys vol: 18 mL
MPHR: 164 {beats}/min
Nuc Stress EF: 60 %
Peak HR: 101 {beats}/min
Percent HR: 61 %
Rest HR: 57 {beats}/min
SDS: 2
SRS: 0
SSS: 4
ST Depression (mm): 0 mm
TID: 0.91

## 2022-05-09 ENCOUNTER — Other Ambulatory Visit: Payer: Self-pay

## 2022-05-09 DIAGNOSIS — R079 Chest pain, unspecified: Secondary | ICD-10-CM

## 2022-05-09 DIAGNOSIS — I2510 Atherosclerotic heart disease of native coronary artery without angina pectoris: Secondary | ICD-10-CM

## 2022-05-10 ENCOUNTER — Encounter (INDEPENDENT_AMBULATORY_CARE_PROVIDER_SITE_OTHER): Payer: Self-pay | Admitting: Vascular Surgery

## 2022-05-10 ENCOUNTER — Ambulatory Visit (INDEPENDENT_AMBULATORY_CARE_PROVIDER_SITE_OTHER): Payer: 59 | Admitting: Vascular Surgery

## 2022-05-10 VITALS — BP 154/96 | HR 82 | Resp 16 | Wt 157.0 lb

## 2022-05-10 DIAGNOSIS — I776 Arteritis, unspecified: Secondary | ICD-10-CM | POA: Diagnosis not present

## 2022-05-10 DIAGNOSIS — I10 Essential (primary) hypertension: Secondary | ICD-10-CM | POA: Diagnosis not present

## 2022-05-10 DIAGNOSIS — E162 Hypoglycemia, unspecified: Secondary | ICD-10-CM

## 2022-05-10 DIAGNOSIS — Z95828 Presence of other vascular implants and grafts: Secondary | ICD-10-CM

## 2022-05-10 DIAGNOSIS — I878 Other specified disorders of veins: Secondary | ICD-10-CM | POA: Diagnosis not present

## 2022-05-10 NOTE — H&P (View-Only) (Signed)
  Patient ID: Amber Christensen, female   DOB: 05/17/1965, 57 y.o.   MRN: [id number]  Chief Complaint  Patient presents with   Follow-up    Ref Callwood consult port deterioration    HPI Amber Christensen is a 57 y.o. female.  I am asked to see the patient by Dr. Callwood for evaluation of Port-A-Cath dysfunction.  She has had this Port-A-Cath in place now for several years.  She did miss 1 flush and it had not been flushed in about 2 to 3 months, but at her most recent visit this would not withdrawal or flush.  No fevers or chills.  No signs of systemic infection.  This is in her right jugular vein with the port reservoir in the right subclavicular location.     Past Medical History:  Diagnosis Date   Allergy    many many allergies   Anemia    in past   Anginal pain (HCC) 01/2018   cardiac workup clear   Anxiety    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HCC)    pt unaware of this diagnosis   Bipolar disorder (HCC)    Bladder mass    removed and had tbt. mesh removed   Blood transfusion without reported diagnosis 1999   received 20 units of blood after tearing esophagus from vomitting so severely after ERCP   Breast mass    left side biopsy was negative   Carpal tunnel syndrome    left hand   Cervical cancer (HCC) 2002   Chronic kidney disease    cyst on left kidney   Chronic pancreatitis (HCC)    prior to pancreas surgery   Cirrhosis (HCC)    Clotting disorder (HCC)    pt states that sometimes she has difficulty stopping to bleed and other times it is okay   Depression    Diabetes mellitus without complication (HCC)    has insulin pump   Dyspnea    Esophagitis    GERD (gastroesophageal reflux disease)    Headache    migraines...takes compazine for this   Heart disease    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HCC)    doctors think this was due to plavix and having a dental procedure which bled alot   Hypertension    Irritable bowel syndrome    Kidney mass    just watching. left kidney mass.   Liver  disease    NASH. has had liver biopsies. negative except for NASH   NASH (nonalcoholic steatohepatitis)    NASH (nonalcoholic steatohepatitis)    Pancreatitis    PONV (postoperative nausea and vomiting)    Stroke (HCC) 11/2017   had tia. no longer needs plavix   Thyroid disease     Past Surgical History:  Procedure Laterality Date   ABDOMINAL HYSTERECTOMY     APPENDECTOMY     BLADDER SURGERY     mesh removed. revision which has caused everything to fall again   BOWEL RESECTION     took a piece of bowel out after pancreatectomy caused problem with bowel.    BREAST BIOPSY Left 1989   benign   CARDIAC CATHETERIZATION  1990,1995, 1998   no stents, fatty streaks   CHOLECYSTECTOMY     COLON SURGERY     COLONOSCOPY WITH PROPOFOL N/A 10/08/2018   Procedure: COLONOSCOPY WITH PROPOFOL;  Surgeon: Skulskie, Martin U, MD;  Location: [facility];  Service: Endoscopy;  Laterality: N/A;   CYST REMOVAL HAND Left 1992   Ganglion cyst removed from Left   Wrist   CYSTECTOMY     CYSTO WITH HYDRODISTENSION N/A 03/04/2019   Procedure: CYSTOSCOPY/HYDRODISTENSION;  Surgeon: MacDiarmid, Scott, MD;  Location: [facility];  Service: Urology;  Laterality: N/A;   DILATATION & CURETTAGE/HYSTEROSCOPY WITH MYOSURE     DILATION AND CURETTAGE OF UTERUS     ERCP     ESOPHAGOGASTRODUODENOSCOPY     HERNIA REPAIR     KNEE ARTHROSCOPY Left 1992   LEFT HEART CATH AND CORONARY ANGIOGRAPHY Left 02/06/2019   Procedure: LEFT HEART CATH AND CORONARY ANGIOGRAPHY;  Surgeon: Callwood, Dwayne D, MD;  Location: [facility];  Service: Cardiovascular;  Laterality: Left;   LIVER BIOPSY     PANCREAS SURGERY  2006   partial pancreatectomy after endoscope knicked a part of pancreas.    PORTA CATH INSERTION N/A 04/10/2017   Procedure: PORTA CATH INSERTION;  Surgeon: Edwinna Rochette S, MD;  Location: [facility];  Service: Cardiovascular;  Laterality: N/A;   PORTA CATH INSERTION N/A 02/21/2019   Procedure: PORTA CATH  INSERTION;  Surgeon: Aaliyana Fredericks S, MD;  Location: [facility];  Service: Cardiovascular;  Laterality: N/A;   REPAIR OF ESOPHAGUS  2012   3 clamps placed in esophagus   ROBOTIC ASSISTED LAPAROSCOPIC VENTRAL/INCISIONAL HERNIA REPAIR N/A 03/15/2018   Procedure: ROBOTIC ASSISTED LAPAROSCOPIC VENTRAL HERNIA REPAIR;  Surgeon: Pabon, Diego F, MD;  Location: [facility];  Service: General;  Laterality: N/A;   SMALL BOWEL REPAIR     TONSILLECTOMY       Family History  Problem Relation Age of Onset   Cirrhosis Mother    Colon cancer Mother 57       TAH/BSO in ~40; deceased at 75   Hypertension Mother    Breast cancer Mother 59       unconfirmed   Hypertension Father    Prostate cancer Father 71       currently 74   Other Father        liver disease   Breast cancer Sister    Non-Hodgkin's lymphoma Daughter        unconfirmed; currently 32   Breast cancer Maternal Aunt        age at dx unk.; currently 72   Cervical cancer Maternal Aunt    Cancer Maternal Aunt        hematologic malignancy; deceased 64   Cancer Maternal Uncle        unk. type; currently 70s   Stomach cancer Paternal Aunt 38       deceased at 38   Breast cancer Maternal Grandmother 48       possibly bilateral in 70s; deceased 74      Social History   Tobacco Use   Smoking status: Never   Smokeless tobacco: Never  Vaping Use   Vaping Use: Never used  Substance Use Topics   Alcohol use: Not Currently    Comment: rarely   Drug use: No     Allergies  Allergen Reactions   Ciprofloxacin     Rash, nausea vomitting   Demerol [Meperidine] Hives and Other (See Comments)    Pt states that this medication causes cardiac arrest.     Dilaudid [Hydromorphone Hcl] Nausea And Vomiting and Other (See Comments)    Pt states that this medication causes cardiac arrest.     Metoclopramide Hives    Breathing issues   Morphine And Related Anaphylaxis and Other (See Comments)    Pt states that this medication causes    cardiac arrest   Morpholine Salicylate Nausea And Vomiting, Other (See Comments) and Rash    CHF   Isosorbide Nitrate Other (See Comments)    Headache    Sulfa Antibiotics Nausea And Vomiting   Flagyl [Metronidazole]     Rash, heart racing, nausea vomitting   Adhesive [Tape] Rash    Blisters skin Paper tape is okay   Darvon [Propoxyphene] Nausea And Vomiting and Rash   Flexeril [Cyclobenzaprine] Nausea And Vomiting and Rash   Levaquin [Levofloxacin In D5w] Nausea And Vomiting and Rash   Naproxen Rash    Ibuprofen and advil are not a problem   Soma [Carisoprodol] Nausea And Vomiting and Rash   Zofran [Ondansetron Hcl] Nausea And Vomiting and Rash    Current Outpatient Medications  Medication Sig Dispense Refill   amLODipine (NORVASC) 5 MG tablet Take 5 mg by mouth daily.     aspirin EC 325 MG tablet Take 325 mg by mouth daily.     Calcium Carb-Cholecalciferol (CALCIUM 600/VITAMIN D3 PO) Take 1 tablet by mouth daily.     Cholecalciferol (VITAMIN D-3) 5000 units TABS Take 5,000 Units by mouth daily.     clobetasol ointment (TEMOVATE) 0.05 % Apply to affected area every night for 4 weeks, then every other day for 4 weeks and then twice a week for 4 weeks or until resolution. 30 g 5   Continuous Blood Gluc Receiver (DEXCOM G6 RECEIVER) DEVI Use to monitor blood sugar. NDC [address]-11     cyanocobalamin (VITAMIN B12) 1000 MCG tablet Take 1,000 mcg by mouth daily.     gabapentin (NEURONTIN) 300 MG capsule Take 600 mg by mouth 3 (three) times daily.     hydrochlorothiazide (HYDRODIURIL) 25 MG tablet Take by mouth.     insulin lispro (HUMALOG) 100 UNIT/ML injection SMARTSIG:0-70 Unit(s) SUB-Q Daily     lipase/protease/amylase (CREON) 36000 UNITS CPEP capsule Take 144,000 Units by mouth in the morning, at noon, in the evening, and at bedtime.     lisinopril (ZESTRIL) 40 MG tablet Take 40 mg by mouth daily.     Magnesium 250 MG TABS Take 250 mg by mouth daily.      metoprolol succinate  (TOPROL-XL) 25 MG 24 hr tablet Take 25 mg by mouth daily.     nitroGLYCERIN (NITROSTAT) 0.3 MG SL tablet Place 0.3 mg under the tongue every 5 (five) minutes as needed for chest pain.     oxyCODONE (OXY IR/ROXICODONE) 5 MG immediate release tablet Take 5 mg by mouth every 4 (four) hours as needed.     Potassium 99 MG TABS Take 1 tablet by mouth daily.     Probiotic Product (PROBIOTIC DAILY PO) Take by mouth daily.     topiramate (TOPAMAX) 25 MG capsule Take 25 mg by mouth daily.     vitamin E 180 MG (400 UNITS) capsule Take 180 Units by mouth daily.     atorvastatin (LIPITOR) 40 MG tablet Take 1 tablet (40 mg total) by mouth daily. 30 tablet 0   estradiol (ESTRACE) 0.1 MG/GM vaginal cream Place 1 g vaginally 3 (three) times a week. (Patient not taking: Reported on 05/10/2022) 42.5 g 2   furosemide (LASIX) 20 MG tablet Take 20 mg by mouth daily. (Patient not taking: Reported on 05/10/2022)     HYDROcodone-acetaminophen (NORCO/VICODIN) 5-325 MG tablet Take 1 tablet by mouth every 6 (six) hours as needed. (Patient not taking: Reported on 05/10/2022) 15 tablet 0   lidocaine (XYLOCAINE) 5 % ointment Apply 1 application   topically as needed. (Patient not taking: Reported on 05/10/2022) 35.44 g 0   No current facility-administered medications for this visit.     REVIEW OF SYSTEMS (Negative unless checked)   Constitutional: []Weight loss  []Fever  []Chills Cardiac: []Chest pain   []Chest pressure   []Palpitations   []Shortness of breath when laying flat   []Shortness of breath at rest   []Shortness of breath with exertion. Vascular:  [x]Pain in legs with walking   [x]Pain in legs at rest   []Pain in legs when laying flat   []Claudication   []Pain in feet when walking  []Pain in feet at rest  []Pain in feet when laying flat   []History of DVT   []Phlebitis   []Swelling in legs   []Varicose veins   []Non-healing ulcers Pulmonary:   []Uses home oxygen   []Productive cough   []Hemoptysis   []Wheeze  []COPD    []Asthma Neurologic:  []Dizziness  []Blackouts   []Seizures   []History of stroke   []History of TIA  []Aphasia   []Temporary blindness   []Dysphagia   []Weakness or numbness in arms   []Weakness or numbness in legs Musculoskeletal:  [x]Arthritis   []Joint swelling   [x]Joint pain   []Low back pain Hematologic:  []Easy bruising  []Easy bleeding   []Hypercoagulable state   []Anemic   Gastrointestinal:  []Blood in stool   []Vomiting blood  [x]Gastroesophageal reflux/heartburn   [x]Abdominal pain Genitourinary:  []Chronic kidney disease   []Difficult urination  []Frequent urination  []Burning with urination   []Hematuria Skin:  []Rashes   []Ulcers   []Wounds Psychological:  []History of anxiety   [] History of major depression.    Physical Exam BP (!) 154/96 (BP Location: Right Arm)   Pulse 82   Resp 16   Wt 157 lb (71.2 kg)   BMI 26.13 kg/m  Gen:  WD/WN, NAD Head: [place]/AT, No temporalis wasting.  Ear/Nose/Throat: Hearing grossly intact, nares w/o erythema or drainage, oropharynx w/o Erythema/Exudate Eyes: Conjunctiva clear, sclera non-icteric  Neck: trachea midline.  No JVD.  Pulmonary:  Good air movement, respirations not labored, no use of accessory muscles  Cardiac: RRR, no JVD Vascular: port in right chest without erythema or drainage. Vessel Right Left  Radial Palpable Palpable                                   Gastrointestinal:. No masses, surgical incisions, or scars. Musculoskeletal: M/S 5/5 throughout.  Extremities without ischemic changes.  No deformity or atrophy. No edema. Neurologic: Sensation grossly intact in extremities.  Symmetrical.  Speech is fluent. Motor exam as listed above. Psychiatric: Judgment intact, Mood & affect appropriate for pt's clinical situation. Dermatologic: No rashes or ulcers noted.  No cellulitis or open wounds.    Radiology NM Myocar Multi W/Spect W/Wall Motion / EF  Result Date: 05/05/2022   The study is normal. The study is low  risk.   No ST deviation was noted.   LV perfusion is normal. There is no evidence of ischemia. There is no evidence of infarction.   Left ventricular function is normal. 1.  Normal left ventricular function 2.  No evidence for scar or ischemia 3.  Low risk study    Labs Recent Results (from the past 2160 hour(s))  NM Myocar Multi W/Spect W/Wall Motion / EF     Status: None   Collection Time: 05/03/22 11:14 AM    Result Value Ref Range   SSS 4.0    SRS 0.0    SDS 2.0    TID 0.91    LV sys vol 18.0 mL   LV dias vol 45.0 46 - 106 mL   Nuc Stress EF 60 %   Rest HR 57.0 bpm   Rest BP 165/85 mmHg   Exercise duration (min) 1 min   Estimated workload 1.0    Peak HR 101 bpm   Peak BP 165/85 mmHg   MPHR 164 bpm   Percent HR 61.0 %   Base ST Depression (mm) 0 mm   ST Depression (mm) 0 mm    Assessment/Plan: Hypertension blood pressure control important in reducing the progression of atherosclerotic disease. On appropriate oral medications.     Diabetes mellitus without complication (HCC) blood glucose control important in reducing the progression of atherosclerotic disease. Also, involved in wound healing. On appropriate medications.     Poor venous access With her multiple medical issues this was the reason for the port placement initially.  She still needs the port indefinitely.  Port-A-Cath in place With current Port-A-Cath being nonfunctional.  It likely has a fibrin sheath and with a recent missed flush, it is not currently working.  She still needs it.  She is asked us to try to get this function with at all possible.  We will bring her in for catheter injection of the port with likely port revision.  Risks and benefits were discussed and she is agreeable to proceed.      Macey Wurtz 05/10/2022, 3:08 PM   This note was created with Dragon medical transcription system.  Any errors from dictation are unintentional.   

## 2022-05-10 NOTE — Progress Notes (Signed)
Patient ID: Amber Christensen, female   DOB: [birth year], 57 y.o.   MRN: [id number]  Chief Complaint  Patient presents with   Follow-up    Ref [facility] consult port deterioration    HPI Amber Christensen is a 57 y.o. female.  I am asked to see the patient by Dr. Clayborn Bigness for evaluation of Port-A-Cath dysfunction.  She has had this Port-A-Cath in place now for several years.  She did miss 1 flush and it had not been flushed in about 2 to 3 months, but at her most recent visit this would not withdrawal or flush.  No fevers or chills.  No signs of systemic infection.  This is in her right jugular vein with the port reservoir in the right subclavicular location.     Past Medical History:  Diagnosis Date   Allergy    many many allergies   Anemia    in past   Anginal pain ([place]) 01/2018   cardiac workup clear   Anxiety    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[place])    pt unaware of this diagnosis   Bipolar disorder ([place])    Bladder mass    removed and had tbt. mesh removed   Blood transfusion without reported diagnosis 1999   received 20 units of blood after tearing esophagus from vomitting so severely after ERCP   Breast mass    left side biopsy was negative   Carpal tunnel syndrome    left hand   Cervical cancer ([place]) 2002   Chronic kidney disease    cyst on left kidney   Chronic pancreatitis ([place])    prior to pancreas surgery   Cirrhosis ([place])    Clotting disorder ([place])    pt states that sometimes she has difficulty stopping to bleed and other times it is okay   Depression    Diabetes mellitus without complication (HCC)    has insulin pump   Dyspnea    Esophagitis    GERD (gastroesophageal reflux disease)    Headache    migraines...takes compazine for this   Heart disease    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 left kidney mass.   Liver  disease    NASH. has had liver biopsies. negative except for NASH   NASH (nonalcoholic steatohepatitis)    NASH (nonalcoholic steatohepatitis)    Pancreatitis    PONV (postoperative nausea and vomiting)    Stroke ([place]) 11/2017   had tia. no longer needs plavix   Thyroid disease     Past Surgical History:  Procedure Laterality Date   ABDOMINAL HYSTERECTOMY     APPENDECTOMY     BLADDER SURGERY     mesh removed. revision which has caused everything to fall again   BOWEL RESECTION     took a piece of bowel out after pancreatectomy caused problem with bowel.    BREAST BIOPSY Left 1989   benign   CARDIAC CATHETERIZATION  [id number], 1998   no stents, fatty streaks   CHOLECYSTECTOMY     COLON SURGERY     COLONOSCOPY WITH PROPOFOL N/A 10/08/2018   Procedure: COLONOSCOPY WITH PROPOFOL;  Surgeon: Lollie Sails, MD;  Location: [facility] ENDOSCOPY;  Service: Endoscopy;  Laterality: N/A;   CYST REMOVAL HAND Left 1992   Ganglion cyst removed from Left  Wrist   CYSTECTOMY     CYSTO WITH HYDRODISTENSION N/A 03/04/2019   Procedure: CYSTOSCOPY/HYDRODISTENSION;  Surgeon: Bjorn Loser, MD;  Location: [facility] ORS;  Service: Urology;  Laterality: N/A;   DILATATION & CURETTAGE/HYSTEROSCOPY WITH MYOSURE     DILATION AND CURETTAGE OF UTERUS     ERCP     ESOPHAGOGASTRODUODENOSCOPY     HERNIA REPAIR     KNEE ARTHROSCOPY Left 1992   LEFT HEART CATH AND CORONARY ANGIOGRAPHY Left 02/06/2019   Procedure: LEFT HEART CATH AND CORONARY ANGIOGRAPHY;  Surgeon: Yolonda Kida, MD;  Location: [facility] LAB;  Service: Cardiovascular;  Laterality: Left;   LIVER BIOPSY     PANCREAS SURGERY  2006   partial pancreatectomy after endoscope knicked a part of pancreas.    PORTA CATH INSERTION N/A 04/10/2017   Procedure: PORTA CATH INSERTION;  Surgeon: Algernon Huxley, MD;  Location: [place] [facility];  Service: Cardiovascular;  Laterality: N/A;   PORTA CATH INSERTION N/A 02/21/2019   Procedure: PORTA CATH  INSERTION;  Surgeon: Algernon Huxley, MD;  Location: [place] [facility];  Service: Cardiovascular;  Laterality: N/A;   REPAIR OF ESOPHAGUS  2012   3 clamps placed in esophagus   ROBOTIC ASSISTED LAPAROSCOPIC VENTRAL/INCISIONAL HERNIA REPAIR N/A 03/15/2018   Procedure: ROBOTIC ASSISTED LAPAROSCOPIC VENTRAL HERNIA REPAIR;  Surgeon: Jules Husbands, MD;  Location: [facility] ORS;  Service: General;  Laterality: N/A;   SMALL BOWEL REPAIR     TONSILLECTOMY       Family History  Problem Relation Age of Onset   Cirrhosis Mother    Colon cancer Mother 64       TAH/BSO in ~70; deceased at 60   Hypertension Mother    Breast cancer Mother 35       unconfirmed   Hypertension Father    Prostate cancer Father 66       currently 4   Other Father        liver disease   Breast cancer Sister    Non-Hodgkin's lymphoma Daughter        unconfirmed; currently 87   Breast cancer Maternal Aunt        age at dx unk.; currently 25   Cervical cancer Maternal Aunt    Cancer Maternal Aunt        hematologic malignancy; deceased 72   Cancer Maternal Uncle        unk. type; currently 1s   Stomach cancer Paternal Aunt 22       deceased at 42   Breast cancer Maternal Grandmother 29       possibly bilateral in 77s; deceased 70      Social History   Tobacco Use   Smoking status: Never   Smokeless tobacco: Never  Vaping Use   Vaping Use: Never used  Substance Use Topics   Alcohol use: Not Currently    Comment: rarely   Drug use: No     Allergies  Allergen Reactions   Ciprofloxacin     Rash, nausea vomitting   Demerol [Meperidine] Hives and Other (See Comments)    Pt states that this medication causes cardiac arrest.     Dilaudid [Hydromorphone Hcl] Nausea And Vomiting and Other (See Comments)    Pt states that this medication causes cardiac arrest.     Metoclopramide Hives    Breathing issues   Morphine And Related Anaphylaxis and Other (See Comments)    Pt states that this medication causes  cardiac arrest   Morpholine Salicylate Nausea And Vomiting, Other (See Comments) and Rash    CHF   Isosorbide Nitrate Other (See Comments)    Headache    Sulfa Antibiotics Nausea And Vomiting   Flagyl [Metronidazole]     Rash, heart racing, nausea vomitting   Adhesive [Tape] Rash    Blisters skin Paper tape is okay   Darvon [Propoxyphene] Nausea And Vomiting and Rash   Flexeril [Cyclobenzaprine] Nausea And Vomiting and Rash   Levaquin [Levofloxacin In D5w] Nausea And Vomiting and Rash   Naproxen Rash    Ibuprofen and advil are not a problem   Soma [Carisoprodol] Nausea And Vomiting and Rash   Zofran [Ondansetron Hcl] Nausea And Vomiting and Rash    Current Outpatient Medications  Medication Sig Dispense Refill   amLODipine (NORVASC) 5 MG tablet Take 5 mg by mouth daily.     aspirin EC 325 MG tablet Take 325 mg by mouth daily.     Calcium Carb-Cholecalciferol (CALCIUM 600/VITAMIN D3 PO) Take 1 tablet by mouth daily.     Cholecalciferol (VITAMIN D-3) 5000 units TABS Take 5,000 Units by mouth daily.     clobetasol ointment (TEMOVATE) 0.05 % Apply to affected area every night for 4 weeks, then every other day for 4 weeks and then twice a week for 4 weeks or until resolution. 30 g 5   Continuous Blood Gluc Receiver (DEXCOM G6 RECEIVER) DEVI Use to monitor blood sugar. NDC [id number]     cyanocobalamin (VITAMIN B12) 1000 MCG tablet Take 1,000 mcg by mouth daily.     gabapentin (NEURONTIN) 300 MG capsule Take 600 mg by mouth 3 (three) times daily.     hydrochlorothiazide (HYDRODIURIL) 25 MG tablet Take by mouth.     insulin lispro (HUMALOG) 100 UNIT/ML injection SMARTSIG:0-70 Unit(s) SUB-Q Daily     lipase/protease/amylase (CREON) 36000 UNITS CPEP capsule Take 144,000 Units by mouth in the morning, at noon, in the evening, and at bedtime.     lisinopril (ZESTRIL) 40 MG tablet Take 40 mg by mouth daily.     Magnesium 250 MG TABS Take 250 mg by mouth daily.      metoprolol succinate  (TOPROL-XL) 25 MG 24 hr tablet Take 25 mg by mouth daily.     nitroGLYCERIN (NITROSTAT) 0.3 MG SL tablet Place 0.3 mg under the tongue every 5 (five) minutes as needed for chest pain.     oxyCODONE (OXY IR/ROXICODONE) 5 MG immediate release tablet Take 5 mg by mouth every 4 (four) hours as needed.     Potassium 99 MG TABS Take 1 tablet by mouth daily.     Probiotic Product (PROBIOTIC DAILY PO) Take by mouth daily.     topiramate (TOPAMAX) 25 MG capsule Take 25 mg by mouth daily.     vitamin E 180 MG (400 UNITS) capsule Take 180 Units by mouth daily.     atorvastatin (LIPITOR) 40 MG tablet Take 1 tablet (40 mg total) by mouth daily. 30 tablet 0   estradiol (ESTRACE) 0.1 MG/GM vaginal cream Place 1 g vaginally 3 (three) times a week. (Patient not taking: Reported on 05/10/2022) 42.5 g 2   furosemide (LASIX) 20 MG tablet Take 20 mg by mouth daily. (Patient not taking: Reported on 05/10/2022)     HYDROcodone-acetaminophen (NORCO/VICODIN) 5-325 MG tablet Take 1 tablet by mouth every 6 (six) hours as needed. (Patient not taking: Reported on 05/10/2022) 15 tablet 0   lidocaine (XYLOCAINE) 5 % ointment Apply 1 application  topically as needed. (Patient not taking: Reported on 05/10/2022) 35.44 g 0   No current facility-administered medications for this visit.     REVIEW OF SYSTEMS (Negative unless checked)   Constitutional: [] Weight loss  [] Fever  [] Chills Cardiac: [] Chest pain   [] Chest pressure   [] Palpitations   [] Shortness of breath when laying flat   [] Shortness of breath at rest   [] Shortness of breath with exertion. Vascular:  [x] Pain in legs with walking   [x] Pain in legs at rest   [] Pain in legs when laying flat   [] Claudication   [] Pain in feet when walking  [] Pain in feet at rest  [] Pain in feet when laying flat   [] History of DVT   [] Phlebitis   [] Swelling in legs   [] Varicose veins   [] Non-healing ulcers Pulmonary:   [] Uses home oxygen   [] Productive cough   [] Hemoptysis   [] Wheeze  [] COPD    [] Asthma Neurologic:  [] Dizziness  [] Blackouts   [] Seizures   [] History of stroke   [] History of TIA  [] Aphasia   [] Temporary blindness   [] Dysphagia   [] Weakness or numbness in arms   [] Weakness or numbness in legs Musculoskeletal:  [x] Arthritis   [] Joint swelling   [x] Joint pain   [] Low back pain Hematologic:  [] Easy bruising  [] Easy bleeding   [] Hypercoagulable state   [] Anemic   Gastrointestinal:  [] Blood in stool   [] Vomiting blood  [x] Gastroesophageal reflux/heartburn   [x] Abdominal pain Genitourinary:  [] Chronic kidney disease   [] Difficult urination  [] Frequent urination  [] Burning with urination   [] Hematuria Skin:  [] Rashes   [] Ulcers   [] Wounds Psychological:  [] History of anxiety   []  History of major depression.    Physical Exam BP (!) 154/96 (BP Location: Right Arm)   Pulse 82   Resp 16   Wt 157 lb (71.2 kg)   BMI 26.13 kg/m  Gen:  WD/WN, NAD Head: [place]/AT, No temporalis wasting.  Ear/Nose/Throat: Hearing grossly intact, nares w/o erythema or drainage, oropharynx w/o Erythema/Exudate Eyes: Conjunctiva clear, sclera non-icteric  Neck: trachea midline.  No JVD.  Pulmonary:  Good air movement, respirations not labored, no use of accessory muscles  Cardiac: RRR, no JVD Vascular: port in right chest without erythema or drainage. Vessel Right Left  Radial Palpable Palpable                                   Gastrointestinal:. No masses, surgical incisions, or scars. Musculoskeletal: M/S 5/5 throughout.  Extremities without ischemic changes.  No deformity or atrophy. No edema. Neurologic: Sensation grossly intact in extremities.  Symmetrical.  Speech is fluent. Motor exam as listed above. Psychiatric: Judgment intact, Mood & affect appropriate for pt's clinical situation. Dermatologic: No rashes or ulcers noted.  No cellulitis or open wounds.    Radiology NM Myocar Multi W/Spect W/Wall Motion / EF  Result Date: 05/05/2022   The study is normal. The study is low  risk.   No ST deviation was noted.   LV perfusion is normal. There is no evidence of ischemia. There is no evidence of infarction.   Left ventricular function is normal. 1.  Normal left ventricular function 2.  No evidence for scar or ischemia 3.  Low risk study    Labs Recent Results (from the past 2160 hour(s))  NM Myocar Multi W/Spect W/Wall Motion / EF     Status: None   Collection Time: 05/03/22 11:14 AM  Result Value Ref Range   SSS 4.0    SRS 0.0    SDS 2.0    TID 0.91    LV sys vol 18.0 mL   LV dias vol 45.0 46 - 106 mL   Nuc Stress EF 60 %   Rest HR 57.0 bpm   Rest BP 165/85 mmHg   Exercise duration (min) 1 min   Estimated workload 1.0    Peak HR 101 bpm   Peak BP 165/85 mmHg   MPHR 164 bpm   Percent HR 61.0 %   Base ST Depression (mm) 0 mm   ST Depression (mm) 0 mm    Assessment/Plan: Hypertension blood pressure control important in reducing the progression of atherosclerotic disease. On appropriate oral medications.     Diabetes mellitus without complication (HCC) blood glucose control important in reducing the progression of atherosclerotic disease. Also, involved in wound healing. On appropriate medications.     Poor venous access With her multiple medical issues this was the reason for the port placement initially.  She still needs the port indefinitely.  Port-A-Cath in place With current Port-A-Cath being nonfunctional.  It likely has a fibrin sheath and with a recent missed flush, it is not currently working.  She still needs it.  She is asked [place] to try to get this function with at all possible.  We will bring her in for catheter injection of the port with likely port revision.  Risks and benefits were discussed and she is agreeable to proceed.      Leotis Pain 05/10/2022, 3:08 PM   This note was created with Dragon medical transcription system.  Any errors from dictation are unintentional.

## 2022-05-10 NOTE — Assessment & Plan Note (Signed)
With current Port-A-Cath being nonfunctional.  It likely has a fibrin sheath and with a recent missed flush, it is not currently working.  She still needs it.  She is asked [place] to try to get this function with at all possible.  We will bring her in for catheter injection of the port with likely port revision.  Risks and benefits were discussed and she is agreeable to proceed.

## 2022-05-11 ENCOUNTER — Other Ambulatory Visit: Payer: Self-pay | Admitting: Internal Medicine

## 2022-05-11 DIAGNOSIS — Z1231 Encounter for screening mammogram for malignant neoplasm of breast: Secondary | ICD-10-CM

## 2022-05-16 ENCOUNTER — Telehealth (INDEPENDENT_AMBULATORY_CARE_PROVIDER_SITE_OTHER): Payer: Self-pay

## 2022-05-16 NOTE — Telephone Encounter (Signed)
I attempted to contact the patient to schedule her for a port revision with Dr. Lucky Cowboy. A message was left for a return call.

## 2022-05-17 NOTE — Telephone Encounter (Signed)
Patient left a message about no one calling her to get her scheduled for a procedure. I have called again and it goes straight to voicemail and there are no other numbers to contact. I left a message for a return call.

## 2022-05-17 NOTE — Telephone Encounter (Signed)
I again attempted to contact the patient to give her information regarding her procedure with Dr. Lucky Cowboy. A message was left on her home/mobile number and her spouse home/mobile number as well.

## 2022-05-18 NOTE — Telephone Encounter (Signed)
Spoke with the patient and she is scheduled with Dr.Dew on 05/19/22 with a 11:00 am arrival time to the [facility] for a right port revision

## 2022-05-19 ENCOUNTER — Other Ambulatory Visit (INDEPENDENT_AMBULATORY_CARE_PROVIDER_SITE_OTHER): Payer: Self-pay | Admitting: Nurse Practitioner

## 2022-05-19 ENCOUNTER — Ambulatory Visit
Admission: RE | Admit: 2022-05-19 | Discharge: 2022-05-19 | Disposition: A | Discharge status: Home / Self Care | Payer: 59 | Attending: Vascular Surgery | Admitting: Vascular Surgery

## 2022-05-19 ENCOUNTER — Encounter
Admission: RE | Disposition: A | Discharge status: Home / Self Care | Payer: Self-pay | Source: Home / Self Care | Attending: Vascular Surgery

## 2022-05-19 ENCOUNTER — Telehealth (HOSPITAL_COMMUNITY): Payer: Self-pay | Admitting: Emergency Medicine

## 2022-05-19 ENCOUNTER — Encounter: Payer: Self-pay | Admitting: Vascular Surgery

## 2022-05-19 ENCOUNTER — Other Ambulatory Visit: Payer: Self-pay

## 2022-05-19 DIAGNOSIS — I10 Essential (primary) hypertension: Secondary | ICD-10-CM | POA: Diagnosis not present

## 2022-05-19 DIAGNOSIS — T82514A Breakdown (mechanical) of infusion catheter, initial encounter: Secondary | ICD-10-CM | POA: Diagnosis present

## 2022-05-19 DIAGNOSIS — I878 Other specified disorders of veins: Secondary | ICD-10-CM

## 2022-05-19 DIAGNOSIS — R079 Chest pain, unspecified: Secondary | ICD-10-CM

## 2022-05-19 DIAGNOSIS — I776 Arteritis, unspecified: Secondary | ICD-10-CM

## 2022-05-19 DIAGNOSIS — Y849 Medical procedure, unspecified as the cause of abnormal reaction of the patient, or of later complication, without mention of misadventure at the time of the procedure: Secondary | ICD-10-CM | POA: Insufficient documentation

## 2022-05-19 DIAGNOSIS — E119 Type 2 diabetes mellitus without complications: Secondary | ICD-10-CM | POA: Insufficient documentation

## 2022-05-19 HISTORY — PX: PORTA CATH INSERTION: CATH118285

## 2022-05-19 LAB — GLUCOSE, CAPILLARY: Glucose-Capillary: 151 mg/dL — ABNORMAL HIGH (ref 70–99)

## 2022-05-19 SURGERY — PORTA CATH INSERTION
Anesthesia: Moderate Sedation | Laterality: Right

## 2022-05-19 MED ORDER — METHYLPREDNISOLONE SODIUM SUCC 125 MG IJ SOLR: 125.0000 mg | Freq: Once | INTRAMUSCULAR | Status: DC | PRN

## 2022-05-19 MED ORDER — FAMOTIDINE 20 MG PO TABS: 40.0000 mg | ORAL_TABLET | Freq: Once | ORAL | Status: DC | PRN

## 2022-05-19 MED ORDER — MIDAZOLAM HCL 2 MG/2ML IJ SOLN
INTRAMUSCULAR | Status: DC | PRN
  Administered 2022-05-19: 2 mg via INTRAVENOUS
  Administered 2022-05-19: 1 mg via INTRAVENOUS

## 2022-05-19 MED ORDER — CEFAZOLIN SODIUM-DEXTROSE 2-4 GM/100ML-% IV SOLN
INTRAVENOUS | Status: AC
  Filled 2022-05-19: qty 100

## 2022-05-19 MED ORDER — MIDAZOLAM HCL 2 MG/ML PO SYRP: 8.0000 mg | ORAL_SOLUTION | Freq: Once | ORAL | Status: DC | PRN

## 2022-05-19 MED ORDER — IVABRADINE HCL 5 MG PO TABS
15.0000 mg | ORAL_TABLET | Freq: Once | ORAL | 0 refills | Status: AC
Start: 2022-05-19 — End: 2022-05-19

## 2022-05-19 MED ORDER — FENTANYL CITRATE PF 50 MCG/ML IJ SOSY
PREFILLED_SYRINGE | INTRAMUSCULAR | Status: AC
  Filled 2022-05-19: qty 2

## 2022-05-19 MED ORDER — SODIUM CHLORIDE 0.9 % IV SOLN: INTRAVENOUS | Status: DC

## 2022-05-19 MED ORDER — METOPROLOL TARTRATE 100 MG PO TABS
100.0000 mg | ORAL_TABLET | Freq: Once | ORAL | 0 refills | Status: DC
Start: 2022-05-19 — End: 2022-05-24

## 2022-05-19 MED ORDER — DIPHENHYDRAMINE HCL 50 MG/ML IJ SOLN: 50.0000 mg | Freq: Once | INTRAMUSCULAR | Status: DC | PRN

## 2022-05-19 MED ORDER — CEFAZOLIN SODIUM-DEXTROSE 2-4 GM/100ML-% IV SOLN
2.0000 g | INTRAVENOUS | Status: AC
  Administered 2022-05-19: 2 g via INTRAVENOUS

## 2022-05-19 MED ORDER — SODIUM CHLORIDE 0.9 % IV SOLN
80.0000 mg | Freq: Once | INTRAVENOUS | Status: DC
  Filled 2022-05-19: qty 2

## 2022-05-19 MED ORDER — FENTANYL CITRATE (PF) 100 MCG/2ML IJ SOLN
INTRAMUSCULAR | Status: DC | PRN
  Administered 2022-05-19: 25 ug via INTRAVENOUS
  Administered 2022-05-19: 50 ug via INTRAVENOUS

## 2022-05-19 MED ORDER — MIDAZOLAM HCL 2 MG/2ML IJ SOLN
INTRAMUSCULAR | Status: AC
  Filled 2022-05-19: qty 4

## 2022-05-19 SURGICAL SUPPLY — 13 items
ADH SKN CLS APL DERMABOND .7 (GAUZE/BANDAGES/DRESSINGS) ×1
DERMABOND ADVANCED .7 DNX12 (GAUZE/BANDAGES/DRESSINGS) IMPLANT
ELECT REM PT RETURN 9FT ADLT (ELECTROSURGICAL) ×1
ELECTRODE REM PT RTRN 9FT ADLT (ELECTROSURGICAL) IMPLANT
GUIDEWIRE AMPLATZ SHORT (WIRE) IMPLANT
HANDLE YANKAUER SUCT BULB TIP (MISCELLANEOUS) IMPLANT
KIT PORT POWER 8FR ALT SEPTUM (Port) IMPLANT
PACK ANGIOGRAPHY (CUSTOM PROCEDURE TRAY) ×1 IMPLANT
PENCIL ELECTRO HAND CTR (MISCELLANEOUS) IMPLANT
SUT MNCRL AB 4-0 PS2 18 (SUTURE) IMPLANT
SUT VIC AB 3-0 SH 27 (SUTURE) ×1
SUT VIC AB 3-0 SH 27X BRD (SUTURE) IMPLANT
TUBING CONNECTING 10 (TUBING) IMPLANT

## 2022-05-19 NOTE — Op Note (Signed)
      [facility]       Operative Note  Date: 05/19/2022  Preoperative diagnosis:  1.  Vasculitis, poor venous access  Postoperative diagnosis:  Same as above  Procedures: #1.  Removal and replacement of CT compatible Port-A-Cath, right internal jugular vein. #2. Fluoroscopic guidance for placement of catheter.  Surgeon: Leotis Pain, MD.   Anesthesia: Local with moderate conscious sedation for approximately 28  minutes using 3 mg of Versed and 75 mcg of Fentanyl  Fluoroscopy time: less than 1 minute  Contrast used: 0  Estimated blood loss: 5 cc  Indication for the procedure:  The patient is a 57 y.o.female with a litany of medical issues including vasculitis and very poor venous access with a Port-A-Cath that is currently nonfunctional.  The patient needs a Port-A-Cath for durable venous access, ongoing treatments, lab draws, and CT scans. We are asked to place this. Risks and benefits were discussed and informed consent was obtained.  Description of procedure: The patient was brought to the vascular and interventional radiology suite.  Moderate conscious sedation was administered throughout the procedure during a face to face encounter with the patient with my supervision of the RN administering medicines and monitoring the patient's vital signs, pulse oximetry, telemetry and mental status throughout from the start of the procedure until the patient was taken to the recovery room. The right neck chest and shoulder were sterilely prepped and draped, and a sterile surgical field was created.  The area around the old port was anesthetized and the previous incision was reopened.  I dissected down to the port and the catheter.  This was secured and the existing sutures were removed.  The catheter was disconnected to the port and the previous port was removed.  An Amplatz wire was placed through the catheter and the old catheter was then removed.  A new catheter was brought  onto the field.  It was then placed over the Amplatz wire and the Amplatz wire was removed.  The catheter tip was parked in excellent location under fluorocoscopic guidance in the cavoatrial junction.  It was then connected to the port which was tucked into the pocket.  The pocket was then irrigated with antibiotic impregnated saline and the wound was closed with a running 3-0 Vicryl and a 4-0 Monocryl. The access incision was closed with a single 4-0 Monocryl. The Huber needle was used to withdraw blood and flush the port with heparinized saline. Dermabond was then placed as a dressing. The patient tolerated the procedure well and was taken to the recovery room in stable condition.   Leotis Pain 05/19/2022 1:57 PM   This note was created with Dragon Medical transcription system. Any errors in dictation are purely unintentional.

## 2022-05-19 NOTE — Telephone Encounter (Signed)
Reaching out to patient to offer assistance regarding upcoming cardiac imaging study; pt verbalizes understanding of appt date/time, parking situation and where to check in, pre-test NPO status and medications ordered, and verified current allergies; name and call back number provided for further questions should they arise Heru Montz RN Navigator Cardiac Imaging [facility] [phone] office [phone] cell  100mg metoprolol + 15mg ivabradine  

## 2022-05-19 NOTE — Interval H&P Note (Signed)
History and Physical Interval Note:  05/19/2022 10:56 AM  Amber Christensen  has presented today for surgery, with the diagnosis of R Port Revision   Poor venous access.  The various methods of treatment have been discussed with the patient and family. After consideration of risks, benefits and other options for treatment, the patient has consented to  Procedure(s): PORTA CATH INSERTION (Right) as a surgical intervention.  The patient's history has been reviewed, patient examined, no change in status, stable for surgery.  I have reviewed the patient's chart and labs.  Questions were answered to the patient's satisfaction.     Leotis Pain

## 2022-05-20 ENCOUNTER — Encounter: Payer: Self-pay | Admitting: Vascular Surgery

## 2022-05-20 ENCOUNTER — Telehealth (HOSPITAL_COMMUNITY): Payer: Self-pay | Admitting: Emergency Medicine

## 2022-05-20 ENCOUNTER — Telehealth (HOSPITAL_COMMUNITY): Payer: Self-pay | Admitting: *Deleted

## 2022-05-20 ENCOUNTER — Encounter (HOSPITAL_COMMUNITY): Payer: Self-pay

## 2022-05-20 NOTE — Telephone Encounter (Signed)
Patient returning call upcoming cardiac imaging study; pt verbalizes understanding of appt date/time, parking situation and where to check in, pre-test NPO status and medications ordered, and verified current allergies; name and call back number provided for further questions should they arise  Larey Brick RN Navigator Cardiac Imaging [facility] [phone] office [phone] cell  Patient to take 100mg  metoprolol tartrate and 15mg  ivabradine two hours prior to her cardiac CT scan. She reports that she is a difficult IV start.

## 2022-05-20 NOTE — Telephone Encounter (Signed)
Attempted to call patient regarding upcoming cardiac CT appointment. °Left message on voicemail with name and callback number °Vineet Kinney RN Navigator Cardiac Imaging °[facility] °[phone] Office °[phone] Cell ° °

## 2022-05-23 ENCOUNTER — Ambulatory Visit
Admission: RE | Admit: 2022-05-23 | Discharge: 2022-05-23 | Disposition: A | Discharge status: Home / Self Care | Payer: 59 | Source: Ambulatory Visit | Attending: Internal Medicine | Admitting: Internal Medicine

## 2022-05-23 DIAGNOSIS — I2510 Atherosclerotic heart disease of native coronary artery without angina pectoris: Secondary | ICD-10-CM | POA: Diagnosis present

## 2022-05-23 DIAGNOSIS — R079 Chest pain, unspecified: Secondary | ICD-10-CM | POA: Diagnosis present

## 2022-05-23 MED ORDER — NITROGLYCERIN 0.4 MG SL SUBL: 0.8000 mg | SUBLINGUAL_TABLET | Freq: Once | SUBLINGUAL | Status: DC

## 2022-05-23 MED ORDER — IOHEXOL 350 MG/ML SOLN
75.0000 mL | Freq: Once | INTRAVENOUS | Status: AC | PRN
  Administered 2022-05-23: 75 mL via INTRAVENOUS

## 2022-05-23 NOTE — Progress Notes (Signed)
Pt IV intact and flushes well. Pt instructed to take premedicated meds that were prescribed.

## 2022-05-23 NOTE — Progress Notes (Signed)
Pt IV occluded. Attempted another IV with no success. Pt informed of needing to reschedule and have it scheduled at the hospital. Unable to complete scan at this time.

## 2022-05-24 ENCOUNTER — Telehealth (INDEPENDENT_AMBULATORY_CARE_PROVIDER_SITE_OTHER): Payer: Self-pay | Admitting: Nurse Practitioner

## 2022-05-24 ENCOUNTER — Other Ambulatory Visit (HOSPITAL_COMMUNITY): Payer: Self-pay | Admitting: *Deleted

## 2022-05-24 DIAGNOSIS — R079 Chest pain, unspecified: Secondary | ICD-10-CM

## 2022-05-24 MED ORDER — METOPROLOL TARTRATE 100 MG PO TABS
100.0000 mg | ORAL_TABLET | Freq: Once | ORAL | 0 refills | Status: DC
Start: 2022-05-24 — End: 2023-08-24

## 2022-05-24 MED ORDER — IVABRADINE HCL 5 MG PO TABS: ORAL_TABLET | ORAL | 0 refills | Status: DC

## 2022-05-24 NOTE — Telephone Encounter (Signed)
Contacted the outpatient same-day surgery to coordinate port flushes for the patient.  Rx was sent for saline and heparin flush per protocol every 6 weeks to the study surgery clinic at the attention of cement that the Charge RN.  We discussed this over the phone as well.  Following this discussion it is understood that this order should be valid for 1 year.  This information was communicated to the patient by her nurse following her procedure.

## 2022-06-03 ENCOUNTER — Telehealth (HOSPITAL_COMMUNITY): Payer: Self-pay | Admitting: Emergency Medicine

## 2022-06-03 NOTE — Telephone Encounter (Signed)
Reaching out to patient to offer assistance regarding upcoming cardiac imaging study; pt verbalizes understanding of appt date/time, parking situation and where to check in, pre-test NPO status and medications ordered, and verified current allergies; name and call back number provided for further questions should they arise Amber Alexandria RN Navigator Cardiac Imaging [facility] [phone] office [phone] cell   Difficult iv start

## 2022-06-06 ENCOUNTER — Ambulatory Visit
Admission: RE | Admit: 2022-06-06 | Discharge: 2022-06-06 | Disposition: A | Discharge status: Home / Self Care | Payer: 59 | Source: Ambulatory Visit | Attending: Internal Medicine | Admitting: Internal Medicine

## 2022-06-06 DIAGNOSIS — I2510 Atherosclerotic heart disease of native coronary artery without angina pectoris: Secondary | ICD-10-CM | POA: Diagnosis not present

## 2022-06-06 DIAGNOSIS — R079 Chest pain, unspecified: Secondary | ICD-10-CM | POA: Insufficient documentation

## 2022-06-06 MED ORDER — METOPROLOL TARTRATE 5 MG/5ML IV SOLN
INTRAVENOUS | Status: AC
  Filled 2022-06-06: qty 10

## 2022-06-06 MED ORDER — IOHEXOL 350 MG/ML SOLN
100.0000 mL | Freq: Once | INTRAVENOUS | Status: AC | PRN
  Administered 2022-06-06: 100 mL via INTRAVENOUS

## 2022-06-06 MED ORDER — NITROGLYCERIN 0.4 MG SL SUBL
0.8000 mg | SUBLINGUAL_TABLET | Freq: Once | SUBLINGUAL | Status: AC
  Administered 2022-06-06: 0.8 mg via SUBLINGUAL

## 2022-06-06 MED ORDER — METOPROLOL TARTRATE 5 MG/5ML IV SOLN: 10.0000 mg | Freq: Once | INTRAVENOUS | Status: DC

## 2022-06-06 NOTE — Progress Notes (Signed)
Pt completed procedure with no issues. Pt ABCs intact. Pt encouraged to drink plenty of water throughout the day. Pt denies any complaints. Pt ambulatory with steady gait.

## 2022-06-30 ENCOUNTER — Ambulatory Visit
Admission: RE | Admit: 2022-06-30 | Discharge: 2022-06-30 | Disposition: A | Discharge status: Home / Self Care | Payer: 59 | Source: Ambulatory Visit | Attending: Nurse Practitioner | Admitting: Nurse Practitioner

## 2022-07-04 ENCOUNTER — Ambulatory Visit: Payer: 59

## 2022-08-03 ENCOUNTER — Other Ambulatory Visit: Payer: Self-pay | Admitting: Gastroenterology

## 2022-08-03 DIAGNOSIS — K861 Other chronic pancreatitis: Secondary | ICD-10-CM

## 2022-08-03 DIAGNOSIS — R1012 Left upper quadrant pain: Secondary | ICD-10-CM

## 2022-08-03 DIAGNOSIS — R7989 Other specified abnormal findings of blood chemistry: Secondary | ICD-10-CM

## 2022-08-11 ENCOUNTER — Ambulatory Visit
Admission: RE | Admit: 2022-08-11 | Discharge: 2022-08-11 | Disposition: A | Discharge status: Home / Self Care | Payer: 59 | Source: Ambulatory Visit | Attending: Gastroenterology | Admitting: Gastroenterology

## 2022-08-11 DIAGNOSIS — K861 Other chronic pancreatitis: Secondary | ICD-10-CM

## 2022-08-11 DIAGNOSIS — R7989 Other specified abnormal findings of blood chemistry: Secondary | ICD-10-CM

## 2022-08-11 DIAGNOSIS — R1012 Left upper quadrant pain: Secondary | ICD-10-CM

## 2022-08-11 MED ORDER — IOPAMIDOL (ISOVUE-300) INJECTION 61%
75.0000 mL | Freq: Once | INTRAVENOUS | Status: AC | PRN
  Administered 2022-08-11: 75 mL via INTRAVENOUS

## 2022-08-22 ENCOUNTER — Ambulatory Visit
Admission: RE | Admit: 2022-08-22 | Discharge: 2022-08-22 | Disposition: A | Discharge status: Home / Self Care | Payer: 59 | Source: Ambulatory Visit | Attending: Vascular Surgery | Admitting: Vascular Surgery

## 2022-08-22 DIAGNOSIS — Z452 Encounter for adjustment and management of vascular access device: Secondary | ICD-10-CM | POA: Diagnosis present

## 2022-08-22 MED ORDER — HEPARIN SOD (PORK) LOCK FLUSH 100 UNIT/ML IV SOLN
500.0000 [IU] | INTRAVENOUS | Status: AC | PRN
  Administered 2022-08-22: 500 [IU]

## 2022-08-22 MED ORDER — HEPARIN SOD (PORK) LOCK FLUSH 100 UNIT/ML IV SOLN
INTRAVENOUS | Status: AC
  Filled 2022-08-22: qty 5

## 2022-08-22 MED ORDER — SODIUM CHLORIDE 0.9% FLUSH
10.0000 mL | INTRAVENOUS | Status: DC | PRN
  Administered 2022-08-22: 10 mL via INTRAVENOUS

## 2022-09-27 ENCOUNTER — Ambulatory Visit: Payer: 59 | Admitting: Gastroenterology

## 2022-09-28 ENCOUNTER — Other Ambulatory Visit: Payer: Self-pay | Admitting: Internal Medicine

## 2022-09-28 DIAGNOSIS — Z1231 Encounter for screening mammogram for malignant neoplasm of breast: Secondary | ICD-10-CM

## 2022-09-29 ENCOUNTER — Other Ambulatory Visit: Payer: Self-pay | Admitting: Internal Medicine

## 2022-09-29 DIAGNOSIS — R0602 Shortness of breath: Secondary | ICD-10-CM

## 2022-10-03 ENCOUNTER — Ambulatory Visit
Admission: RE | Admit: 2022-10-03 | Discharge: 2022-10-03 | Disposition: A | Discharge status: Home / Self Care | Payer: 59 | Source: Ambulatory Visit | Attending: Vascular Surgery | Admitting: Vascular Surgery

## 2022-10-06 ENCOUNTER — Other Ambulatory Visit: Payer: Self-pay

## 2022-10-18 ENCOUNTER — Encounter: Payer: Self-pay | Admitting: *Deleted

## 2022-10-18 ENCOUNTER — Ambulatory Visit: Payer: 59 | Admitting: Certified Registered"

## 2022-10-18 ENCOUNTER — Encounter
Admission: RE | Disposition: A | Discharge status: Home / Self Care | Payer: Self-pay | Source: Home / Self Care | Attending: Gastroenterology

## 2022-10-18 ENCOUNTER — Ambulatory Visit
Admission: RE | Admit: 2022-10-18 | Discharge: 2022-10-18 | Disposition: A | Discharge status: Home / Self Care | Payer: 59 | Attending: Gastroenterology | Admitting: Gastroenterology

## 2022-10-18 DIAGNOSIS — K861 Other chronic pancreatitis: Secondary | ICD-10-CM | POA: Diagnosis not present

## 2022-10-18 DIAGNOSIS — Z86010 Personal history of colonic polyps: Secondary | ICD-10-CM | POA: Diagnosis present

## 2022-10-18 DIAGNOSIS — R1012 Left upper quadrant pain: Secondary | ICD-10-CM | POA: Diagnosis present

## 2022-10-18 DIAGNOSIS — E854 Organ-limited amyloidosis: Secondary | ICD-10-CM | POA: Diagnosis not present

## 2022-10-18 DIAGNOSIS — I43 Cardiomyopathy in diseases classified elsewhere: Secondary | ICD-10-CM | POA: Insufficient documentation

## 2022-10-18 DIAGNOSIS — I129 Hypertensive chronic kidney disease with stage 1 through stage 4 chronic kidney disease, or unspecified chronic kidney disease: Secondary | ICD-10-CM | POA: Insufficient documentation

## 2022-10-18 DIAGNOSIS — Z980 Intestinal bypass and anastomosis status: Secondary | ICD-10-CM | POA: Diagnosis not present

## 2022-10-18 DIAGNOSIS — K7581 Nonalcoholic steatohepatitis (NASH): Secondary | ICD-10-CM | POA: Insufficient documentation

## 2022-10-18 DIAGNOSIS — Z1211 Encounter for screening for malignant neoplasm of colon: Secondary | ICD-10-CM | POA: Insufficient documentation

## 2022-10-18 DIAGNOSIS — Z800 Family history of malignant neoplasm of digestive organs: Secondary | ICD-10-CM | POA: Insufficient documentation

## 2022-10-18 DIAGNOSIS — N189 Chronic kidney disease, unspecified: Secondary | ICD-10-CM | POA: Diagnosis not present

## 2022-10-18 DIAGNOSIS — K219 Gastro-esophageal reflux disease without esophagitis: Secondary | ICD-10-CM | POA: Diagnosis not present

## 2022-10-18 DIAGNOSIS — Z8673 Personal history of transient ischemic attack (TIA), and cerebral infarction without residual deficits: Secondary | ICD-10-CM | POA: Insufficient documentation

## 2022-10-18 DIAGNOSIS — E271 Primary adrenocortical insufficiency: Secondary | ICD-10-CM | POA: Insufficient documentation

## 2022-10-18 DIAGNOSIS — Z794 Long term (current) use of insulin: Secondary | ICD-10-CM | POA: Diagnosis not present

## 2022-10-18 DIAGNOSIS — E1022 Type 1 diabetes mellitus with diabetic chronic kidney disease: Secondary | ICD-10-CM | POA: Insufficient documentation

## 2022-10-18 HISTORY — PX: COLONOSCOPY WITH PROPOFOL: SHX5780

## 2022-10-18 HISTORY — PX: ESOPHAGOGASTRODUODENOSCOPY (EGD) WITH PROPOFOL: SHX5813

## 2022-10-18 LAB — GLUCOSE, CAPILLARY
Glucose-Capillary: 87 mg/dL (ref 70–99)
Glucose-Capillary: 82 mg/dL (ref 70–99)
Glucose-Capillary: 101 mg/dL — ABNORMAL HIGH (ref 70–99)

## 2022-10-18 SURGERY — COLONOSCOPY WITH PROPOFOL
Anesthesia: General

## 2022-10-18 MED ORDER — PROPOFOL 1000 MG/100ML IV EMUL
INTRAVENOUS | Status: AC
  Filled 2022-10-18: qty 100

## 2022-10-18 MED ORDER — DEXTROSE 50 % IV SOLN
INTRAVENOUS | Status: AC
  Filled 2022-10-18: qty 50

## 2022-10-18 MED ORDER — HEPARIN SOD (PORK) LOCK FLUSH 100 UNIT/ML IV SOLN
500.0000 [IU] | Freq: Once | INTRAVENOUS | Status: AC
  Administered 2022-10-18: 500 [IU]

## 2022-10-18 MED ORDER — PROPOFOL 500 MG/50ML IV EMUL
INTRAVENOUS | Status: DC | PRN
  Administered 2022-10-18: 150 ug/kg/min via INTRAVENOUS

## 2022-10-18 MED ORDER — HEPARIN SOD (PORK) LOCK FLUSH 100 UNIT/ML IV SOLN: 500.0000 [IU] | INTRAVENOUS | Status: DC | PRN

## 2022-10-18 MED ORDER — PROPOFOL 10 MG/ML IV BOLUS
INTRAVENOUS | Status: DC | PRN
  Administered 2022-10-18: 100 mg via INTRAVENOUS
  Administered 2022-10-18: 30 mg via INTRAVENOUS

## 2022-10-18 MED ORDER — SODIUM CHLORIDE (PF) 0.9 % IJ SOLN: 10.0000 mL | Freq: Once | INTRAMUSCULAR | Status: DC

## 2022-10-18 MED ORDER — HEPARIN SOD (PORK) LOCK FLUSH 100 UNIT/ML IV SOLN
INTRAVENOUS | Status: AC
  Filled 2022-10-18: qty 5

## 2022-10-18 MED ORDER — SODIUM CHLORIDE 0.9 % IV SOLN: INTRAVENOUS | Status: DC

## 2022-10-18 MED ORDER — SODIUM CHLORIDE 0.9% FLUSH: 10.0000 mL | Freq: Once | INTRAVENOUS | Status: DC

## 2022-10-18 MED ORDER — DEXTROSE 50 % IV SOLN
INTRAVENOUS | Status: DC | PRN
Start: 2022-10-18 — End: 2022-10-18
  Administered 2022-10-18: 3.5 g via INTRAVENOUS

## 2022-10-18 MED ORDER — LIDOCAINE HCL (CARDIAC) PF 100 MG/5ML IV SOSY
PREFILLED_SYRINGE | INTRAVENOUS | Status: DC | PRN
  Administered 2022-10-18: 100 mg via INTRAVENOUS

## 2022-10-18 NOTE — Transfer of Care (Signed)
Immediate Anesthesia Transfer of Care Note  Patient: Amber Christensen  Procedure(s) Performed: COLONOSCOPY WITH PROPOFOL ESOPHAGOGASTRODUODENOSCOPY (EGD) WITH PROPOFOL  Patient Location: PACU  Anesthesia Type:General  Level of Consciousness: awake, alert , and oriented  Airway & Oxygen Therapy: Patient Spontanous Breathing  Post-op Assessment: Report given to RN and Post -op Vital signs reviewed and stable  Post vital signs: Reviewed and stable  Last Vitals: See PACU flow sheet All VSS and FSBS as noted prior to leaving \\procedure  room.   A & O .  Vitals Value Taken Time  BP    Temp    Pulse    Resp    SpO2      Last Pain:  Vitals:   10/18/22 1223  TempSrc: Temporal  PainSc: 0-No pain         Complications: No notable events documented.

## 2022-10-18 NOTE — Anesthesia Preprocedure Evaluation (Signed)
Anesthesia Evaluation  Patient identified by MRN, date of birth, ID band Patient awake    Reviewed: Allergy & Precautions, H&P , NPO status , Patient's Chart, lab work & pertinent test results  Airway Mallampati: III  TM Distance: >3 FB Neck ROM: full    Dental  (+) Chipped, Poor Dentition,    Pulmonary shortness of breath and with exertion   Pulmonary exam normal        Cardiovascular hypertension, Normal cardiovascular exam  Cardiac amyloidosis  Per cardiology 8/24: Possible infiltrative process like cardiac sarcoid or cardiac amyloid, recommend cardiac MRI and SPEP, UPEP tests for further evaluation.  Chest pain, recent cardiac CTA was unremarkable, showed calcium score of 0. Continue conservative management.  SOB, recent echo with estimated LVEF >55% and Myoview and Holter were reasonably unremarkable, continue conservative management, follow up with pulmonology.     Neuro/Psych  PSYCHIATRIC DISORDERS  Depression    CVA (Left leg weaknes), Residual Symptoms    GI/Hepatic ,GERD  Controlled,,(+) Hepatitis - (NASH)Chronic pancreatitis  1999- received 20 units of blood after tearing esophagus from vomitting so severely after ERCP   Endo/Other  diabetes, Type 1, Insulin Dependent  Autoimmune Addison's disease- pt unaware of diagnosis  Renal/GU Renal InsufficiencyRenal disease  negative genitourinary   Musculoskeletal   Abdominal   Peds  Hematology negative hematology ROS (+)   Anesthesia Other Findings Past Medical History: No date: Allergy     Comment:  many many allergies No date: Anemia     Comment:  in past 01/2018: Anginal pain (HCC)     Comment:  cardiac workup clear No date: Anxiety No date: 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HCC)     Comment:  pt unaware of this diagnosis No date: Bipolar disorder (HCC) No date: Bladder mass     Comment:  removed and had tbt. mesh removed 1999: Blood transfusion without  reported diagnosis     Comment:  received 20 units of blood after tearing esophagus from               vomitting so severely after ERCP No date: Breast mass     Comment:  left side biopsy was negative No date: Carpal tunnel syndrome     Comment:  left hand 2002: Cervical cancer (HCC) No date: Chronic kidney disease     Comment:  cyst on left kidney No date: Chronic pancreatitis (HCC)     Comment:  prior to pancreas surgery No date: Cirrhosis (HCC) No date: Clotting disorder (HCC)     Comment:  pt states that sometimes she has difficulty stopping to               bleed and other times it is okay No date: Depression No date: Diabetes mellitus without complication (HCC)     Comment:  has insulin pump No date: Dyspnea No date: Esophagitis No date: GERD (gastroesophageal reflux disease) No date: Headache     Comment:  migraines...takes compazine for this No date: Heart disease No 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 left kidney mass. No date: Liver disease     Comment:  NASH. has had liver biopsies. negative except for NASH No date: NASH (nonalcoholic steatohepatitis) No date:  NASH (nonalcoholic steatohepatitis) No date: Pancreatitis No date: PONV (postoperative nausea and vomiting) 11/2017: Stroke (HCC)     Comment:  had tia. no longer needs plavix No date: Thyroid disease  Past Surgical History: No date: ABDOMINAL HYSTERECTOMY No date: APPENDECTOMY No date: BLADDER SURGERY     Comment:  mesh removed. revision which has caused everything to               fall again No date: BOWEL RESECTION     Comment:  took a piece of bowel out after pancreatectomy caused               problem with bowel.  1989: BREAST BIOPSY; Left     Comment:  benign [id number], 1998: CARDIAC  CATHETERIZATION     Comment:  no stents, fatty streaks No date: CHOLECYSTECTOMY No date: COLON SURGERY 10/08/2018: COLONOSCOPY WITH PROPOFOL; N/A     Comment:  Procedure: COLONOSCOPY WITH PROPOFOL;  Surgeon:               Christena Deem, MD;  Location: [facility] ENDOSCOPY;                Service: Endoscopy;  Laterality: N/A; 1992: CYST REMOVAL HAND; Left     Comment:  Ganglion cyst removed from Left Wrist No date: CYSTECTOMY 03/04/2019: CYSTO WITH HYDRODISTENSION; N/A     Comment:  Procedure: CYSTOSCOPY/HYDRODISTENSION;  Surgeon:               Alfredo Martinez, MD;  Location: [facility] ORS;  Service:               Urology;  Laterality: N/A; No date: DILATATION & CURETTAGE/HYSTEROSCOPY WITH MYOSURE No date: DILATION AND CURETTAGE OF UTERUS No date: ERCP No date: ESOPHAGOGASTRODUODENOSCOPY No date: HERNIA REPAIR 1992: KNEE ARTHROSCOPY; Left 02/06/2019: LEFT HEART CATH AND CORONARY ANGIOGRAPHY; Left     Comment:  Procedure: LEFT HEART CATH AND CORONARY ANGIOGRAPHY;                Surgeon: Alwyn Pea, MD;  Location: [facility] INVASIVE              CV LAB;  Service: Cardiovascular;  Laterality: Left; No date: LIVER BIOPSY 2006: PANCREAS SURGERY     Comment:  partial pancreatectomy after endoscope knicked a part of              pancreas.  04/10/2017: PORTA CATH INSERTION; N/A     Comment:  Procedure: PORTA CATH INSERTION;  Surgeon: Annice Needy,              MD;  Location: [facility] INVASIVE CV LAB;  Service:               Cardiovascular;  Laterality: N/A; 02/21/2019: PORTA CATH INSERTION; N/A     Comment:  Procedure: PORTA CATH INSERTION;  Surgeon: Annice Needy,              MD;  Location: [facility] INVASIVE CV LAB;  Service:               Cardiovascular;  Laterality: N/A; 05/19/2022: PORTA CATH INSERTION; Right     Comment:  Procedure: PORTA CATH INSERTION;  Surgeon: Annice Needy,              MD;  Location: [facility] INVASIVE CV LAB;  Service:               Cardiovascular;  Laterality: Right; 2012:  REPAIR OF ESOPHAGUS  Comment:  3 clamps placed in esophagus 03/15/2018: ROBOTIC ASSISTED LAPAROSCOPIC VENTRAL/INCISIONAL HERNIA  REPAIR; N/A     Comment:  Procedure: ROBOTIC ASSISTED LAPAROSCOPIC VENTRAL HERNIA               REPAIR;  Surgeon: Leafy Ro, MD;  Location: [facility]               ORS;  Service: General;  Laterality: N/A; No date: SMALL BOWEL REPAIR No date: TONSILLECTOMY  BMI    Body Mass Index: 27.79 kg/m      Reproductive/Obstetrics negative OB ROS                             Anesthesia Physical Anesthesia Plan  ASA: 3  Anesthesia Plan: General   Post-op Pain Management:    Induction:   PONV Risk Score and Plan: Propofol infusion and TIVA  Airway Management Planned:   Additional Equipment:   Intra-op Plan:   Post-operative Plan:   Informed Consent: I have reviewed the patients History and Physical, chart, labs and discussed the procedure including the risks, benefits and alternatives for the proposed anesthesia with the patient or authorized representative who has indicated his/her understanding and acceptance.     Dental Advisory Given  Plan Discussed with: CRNA and Surgeon  Anesthesia Plan Comments:         Anesthesia Quick Evaluation

## 2022-10-18 NOTE — Interval H&P Note (Signed)
History and Physical Interval Note:  10/18/2022 1:30 PM  Amber Christensen  has presented today for surgery, with the diagnosis of 789.02 (ICD-9-CM) - R10.12 (ICD-10-CM) - LUQ pain577.1 (ICD-9-CM) - K86.1 (ICD-10-CM) - Chronic pancreatitis, unspecified pancreatitis type (CMS/HHS-HCC)790.6 (ICD-9-CM) - R79.89 (ICD-10-CM) - Elevated LFTs.  The various methods of treatment have been discussed with the patient and family. After consideration of risks, benefits and other options for treatment, the patient has consented to  Procedure(s): COLONOSCOPY WITH PROPOFOL (N/A) ESOPHAGOGASTRODUODENOSCOPY (EGD) WITH PROPOFOL (N/A) as a surgical intervention.  The patient's history has been reviewed, patient examined, no change in status, stable for surgery.  I have reviewed the patient's chart and labs.  Questions were answered to the patient's satisfaction.     Regis Bill  Ok to proceed with EGD/Colonoscopy

## 2022-10-18 NOTE — Op Note (Signed)
[facility] Patient Name: Amber Christensen Procedure Date: 10/18/2022 1:22 PM MRN: [id number] Account #: [id number] Date of Birth: Jun 08, 1965 Admit Type: Outpatient Age: 57 Room: [facility] ENDO ROOM 1 Gender: Female Note Status: Finalized Instrument Name: Laurette Schimke [id number] Procedure:             Upper GI endoscopy Indications:           Abdominal pain in the left upper quadrant Providers:             Eather Colas MD, MD Referring MD:          Barbette Reichmann, MD (Referring MD) Medicines:             Monitored Anesthesia Care Complications:         No immediate complications. Procedure:             Pre-Anesthesia Assessment:                        - Prior to the procedure, a History and Physical was                         performed, and patient medications and allergies were                         reviewed. The patient is competent. The risks and                         benefits of the procedure and the sedation options and                         risks were discussed with the patient. All questions                         were answered and informed consent was obtained.                         Patient identification and proposed procedure were                         verified by the physician, the nurse, the                         anesthesiologist, the anesthetist and the technician                         in the endoscopy suite. Mental Status Examination:                         alert and oriented. Airway Examination: normal                         oropharyngeal airway and neck mobility. Respiratory                         Examination: clear to auscultation. CV Examination:                         normal. Prophylactic Antibiotics: The patient does not  require prophylactic antibiotics. Prior                         Anticoagulants: The patient has taken no anticoagulant                         or antiplatelet agents.  ASA Grade Assessment: III - A                         patient with severe systemic disease. After reviewing                         the risks and benefits, the patient was deemed in                         satisfactory condition to undergo the procedure. The                         anesthesia plan was to use monitored anesthesia care                         (MAC). Immediately prior to administration of                         medications, the patient was re-assessed for adequacy                         to receive sedatives. The heart rate, respiratory                         rate, oxygen saturations, blood pressure, adequacy of                         pulmonary ventilation, and response to care were                         monitored throughout the procedure. The physical                         status of the patient was re-assessed after the                         procedure.                        After obtaining informed consent, the endoscope was                         passed under direct vision. Throughout the procedure,                         the patient's blood pressure, pulse, and oxygen                         saturations were monitored continuously. The Endoscope                         was introduced through the mouth, and advanced to the  second part of duodenum. The upper GI endoscopy was                         accomplished without difficulty. The patient tolerated                         the procedure well. Findings:      The examined esophagus was normal.      The entire examined stomach was normal.      The examined duodenum was normal. Impression:            - Normal esophagus.                        - Normal stomach.                        - Normal examined duodenum.                        - No specimens collected. Recommendation:        - Discharge patient to home.                        - Resume previous diet.                        -  Continue present medications.                        - Return to referring physician as previously                         scheduled. Procedure Code(s):     --- Professional ---                        [phone], Esophagogastroduodenoscopy, flexible,                         transoral; diagnostic, including collection of                         specimen(s) by brushing or washing, when performed                         (separate procedure) Diagnosis Code(s):     --- Professional ---                        R10.12, Left upper quadrant pain CPT copyright 2022 [employer]. All rights reserved. The codes documented in this report are preliminary and upon coder review may  be revised to meet current compliance requirements. Eather Colas MD, MD 10/18/2022 2:01:56 PM Number of Addenda: 0 Note Initiated On: 10/18/2022 1:22 PM Estimated Blood Loss:  Estimated blood loss: none.      [facility]

## 2022-10-18 NOTE — H&P (Signed)
Outpatient short stay form Pre-procedure 10/18/2022  Regis Bill, MD  Primary Physician: Barbette Reichmann, MD  Reason for visit:  History of MASLD/Family history of colon cancer  History of present illness:    57 y/o lady with history of MASLD, hypertension, acquired DM I, and anxiety here for EGD/Colonoscopy for history of MASLD and surveillance colonoscopy with TA on last colonoscopy in 2020 and family history of colon cancer. No blood thinners except aspirin. History of distal pancreatectomy and hysterectomy. Mother with colon cancer in her 79's.    Current Facility-Administered Medications:    0.9 %  sodium chloride infusion, , Intravenous, Continuous, Resha Filippone, Rossie Muskrat, MD, Last Rate: 20 mL/hr at 10/18/22 1244, New Bag at 10/18/22 1244   heparin lock flush 100 unit/mL, 500 Units, Intracatheter, PRN, Rahil Passey, Rossie Muskrat, MD   sodium chloride (PF) 0.9 % injection 10 mL, 10 mL, Intravenous, Once, Thaddius Manes, Rossie Muskrat, MD  Medications Prior to Admission  Medication Sig Dispense Refill Last Dose   amLODipine (NORVASC) 5 MG tablet Take 5 mg by mouth daily.   10/18/2022 at 0530   aspirin EC 325 MG tablet Take 325 mg by mouth daily.   Past Week   Calcium Carb-Cholecalciferol (CALCIUM 600/VITAMIN D3 PO) Take 1 tablet by mouth daily.   Past Week   Cholecalciferol (VITAMIN D-3) 5000 units TABS Take 5,000 Units by mouth daily.   Past Week   clobetasol ointment (TEMOVATE) 0.05 % Apply to affected area every night for 4 weeks, then every other day for 4 weeks and then twice a week for 4 weeks or until resolution. 30 g 5 Past Week   Continuous Blood Gluc Receiver (DEXCOM G6 RECEIVER) DEVI Use to monitor blood sugar. [facility] [id number]   10/18/2022   cyanocobalamin (VITAMIN B12) 1000 MCG tablet Take 1,000 mcg by mouth daily.   Past Week   gabapentin (NEURONTIN) 300 MG capsule Take 600 mg by mouth 3 (three) times daily.   10/17/2022   insulin lispro (HUMALOG) 100 UNIT/ML injection SMARTSIG:0-70  Unit(s) SUB-Q Daily   10/18/2022   lipase/protease/amylase (CREON) 36000 UNITS CPEP capsule Take 144,000 Units by mouth in the morning, at noon, in the evening, and at bedtime.   Past Week   losartan (COZAAR) 50 MG tablet Take 50 mg by mouth daily.   10/18/2022 at 0530   Magnesium 250 MG TABS Take 250 mg by mouth daily.    Past Week   metoprolol succinate (TOPROL-XL) 25 MG 24 hr tablet Take 25 mg by mouth daily.   10/18/2022 at 0530   pantoprazole (PROTONIX) 40 MG tablet Take 40 mg by mouth daily.   Past Week   Potassium 99 MG TABS Take 1 tablet by mouth daily.   Past Week   Probiotic Product (PROBIOTIC DAILY PO) Take by mouth daily.   Past Week   vitamin E 180 MG (400 UNITS) capsule Take 180 Units by mouth daily.   Past Week   atorvastatin (LIPITOR) 40 MG tablet Take 1 tablet (40 mg total) by mouth daily. 30 tablet 0    estradiol (ESTRACE) 0.1 MG/GM vaginal cream Place 1 g vaginally 3 (three) times a week. (Patient not taking: Reported on 05/10/2022) 42.5 g 2    furosemide (LASIX) 20 MG tablet Take 20 mg by mouth daily. (Patient not taking: Reported on 05/10/2022)      hydrochlorothiazide (HYDRODIURIL) 12.5 MG tablet Take 12.5 mg by mouth daily. (Patient not taking: Reported on 10/18/2022)   Not Taking   HYDROcodone-acetaminophen (NORCO/VICODIN)  5-325 MG tablet Take 1 tablet by mouth every 6 (six) hours as needed. (Patient not taking: Reported on 05/10/2022) 15 tablet 0    ivabradine (CORLANOR) 5 MG TABS tablet Take tablets (15mg ) TWO hours prior to your cardiac CT scan. (Patient not taking: Reported on 10/18/2022) 3 tablet 0 Not Taking   lidocaine (XYLOCAINE) 5 % ointment Apply 1 application topically as needed. (Patient not taking: Reported on 05/10/2022) 35.44 g 0    lisinopril (ZESTRIL) 40 MG tablet Take 40 mg by mouth daily.      metoprolol tartrate (LOPRESSOR) 100 MG tablet Take 1 tablet (100 mg total) by mouth once for 1 dose. Please take one time dose 100mg  metoprolol tartrate 2 hr prior to cardiac CT  for HR control IF HR >55bpm. 1 tablet 0    nitroGLYCERIN (NITROSTAT) 0.3 MG SL tablet Place 0.3 mg under the tongue every 5 (five) minutes as needed for chest pain.    at prn   oxyCODONE (OXY IR/ROXICODONE) 5 MG immediate release tablet Take 5 mg by mouth every 4 (four) hours as needed. (Patient not taking: Reported on 10/18/2022)   Not Taking   topiramate (TOPAMAX) 25 MG capsule Take 25 mg by mouth daily. (Patient not taking: Reported on 10/18/2022)   Not Taking     Allergies  Allergen Reactions   Ciprofloxacin     Rash, nausea vomitting   Demerol [Meperidine] Hives and Other (See Comments)    Pt states that this medication causes cardiac arrest.     Dilaudid [Hydromorphone Hcl] Nausea And Vomiting and Other (See Comments)    Pt states that this medication causes cardiac arrest.     Metoclopramide Hives    Breathing issues   Morphine And Codeine Anaphylaxis and Other (See Comments)    Pt states that this medication causes cardiac arrest   Morpholine Salicylate Nausea And Vomiting, Other (See Comments) and Rash    CHF   Isosorbide Nitrate Other (See Comments)    Headache    Sulfa Antibiotics Nausea And Vomiting   Amoxicillin    Flagyl [Metronidazole]     Rash, heart racing, nausea vomitting   Adhesive [Tape] Rash    Blisters skin Paper tape is okay   Darvon [Propoxyphene] Nausea And Vomiting and Rash   Flexeril [Cyclobenzaprine] Nausea And Vomiting and Rash   Levaquin [Levofloxacin In D5w] Nausea And Vomiting and Rash   Naproxen Rash    Ibuprofen and advil are not a problem   Soma [Carisoprodol] Nausea And Vomiting and Rash   Zofran [Ondansetron Hcl] Nausea And Vomiting and Rash     Past Medical History:  Diagnosis Date   Allergy    many many allergies   Anemia    in past   Anginal pain (HCC) 01/2018   cardiac workup clear   Anxiety    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HCC)    pt unaware of this diagnosis   Bipolar disorder (HCC)    Bladder mass    removed and had tbt.  mesh removed   Blood transfusion without reported diagnosis 1999   received 20 units of blood after tearing esophagus from vomitting so severely after ERCP   Breast mass    left side biopsy was negative   Carpal tunnel syndrome    left hand   Cervical cancer (HCC) 2002   Chronic kidney disease    cyst on left kidney   Chronic pancreatitis (HCC)    prior to pancreas surgery   Cirrhosis (HCC)  Clotting disorder (HCC)    pt states that sometimes she has difficulty stopping to bleed and other times it is okay   Depression    Diabetes mellitus without complication (HCC)    has insulin pump   Dyspnea    Esophagitis    GERD (gastroesophageal reflux disease)    Headache    migraines...takes compazine for this   Heart disease    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 left kidney mass.   Liver disease    NASH. has had liver biopsies. negative except for NASH   NASH (nonalcoholic steatohepatitis)    NASH (nonalcoholic steatohepatitis)    Pancreatitis    PONV (postoperative nausea and vomiting)    Stroke (HCC) 11/2017   had tia. no longer needs plavix   Thyroid disease     Review of systems:  Otherwise negative.    Physical Exam  Gen: Alert, oriented. Appears stated age.  HEENT: PERRLA. Lungs: No respiratory distress CV: RRR Abd: soft, benign, no masses Ext: No edema    Planned procedures: Proceed with EGD/colonoscopy. The patient understands the nature of the planned procedure, indications, risks, alternatives and potential complications including but not limited to bleeding, infection, perforation, damage to internal organs and possible oversedation/side effects from anesthesia. The patient agrees and gives consent to proceed.  Please refer to procedure notes for findings, recommendations and patient disposition/instructions.      Regis Bill, MD [facility]

## 2022-10-18 NOTE — Op Note (Signed)
[facility] Gastroenterology Patient Name: Amber Christensen Procedure Date: 10/18/2022 1:21 PM MRN: [id number] Account #: [id number] Date of Birth: 1965-12-17 Admit Type: Outpatient Age: 57 Room: [facility] ENDO ROOM 1 Gender: Female Note Status: Finalized Instrument Name: Prentice Docker [id number] Procedure:             Colonoscopy Indications:           Surveillance: Personal history of adenomatous polyps                         on last colonoscopy 5 years ago, Family history of                         colon cancer in a first-degree relative before age 55                         years Providers:             Eather Colas MD, MD Referring MD:          Barbette Reichmann, MD (Referring MD) Medicines:             Monitored Anesthesia Care Complications:         No immediate complications. Procedure:             Pre-Anesthesia Assessment:                        - Prior to the procedure, a History and Physical was                         performed, and patient medications and allergies were                         reviewed. The patient is competent. The risks and                         benefits of the procedure and the sedation options and                         risks were discussed with the patient. All questions                         were answered and informed consent was obtained.                         Patient identification and proposed procedure were                         verified by the physician, the nurse, the                         anesthesiologist, the anesthetist and the technician                         in the endoscopy suite. Mental Status Examination:                         alert and oriented. Airway Examination: normal  oropharyngeal airway and neck mobility. Respiratory                         Examination: clear to auscultation. CV Examination:                         normal. Prophylactic Antibiotics: The patient does not                          require prophylactic antibiotics. Prior                         Anticoagulants: The patient has taken no anticoagulant                         or antiplatelet agents except for aspirin. ASA Grade                         Assessment: III - A patient with severe systemic                         disease. After reviewing the risks and benefits, the                         patient was deemed in satisfactory condition to                         undergo the procedure. The anesthesia plan was to use                         monitored anesthesia care (MAC). Immediately prior to                         administration of medications, the patient was                         re-assessed for adequacy to receive sedatives. The                         heart rate, respiratory rate, oxygen saturations,                         blood pressure, adequacy of pulmonary ventilation, and                         response to care were monitored throughout the                         procedure. The physical status of the patient was                         re-assessed after the procedure.                        After obtaining informed consent, the colonoscope was                         passed under direct vision. Throughout the procedure,  the patient's blood pressure, pulse, and oxygen                         saturations were monitored continuously. The                         Colonoscope was introduced through the anus and                         advanced to the the ileocolonic anastomosis. The                         colonoscopy was somewhat difficult due to inadequate                         bowel prep. The patient tolerated the procedure well.                         The quality of the bowel preparation was poor. The                         ileocecal valve, the appendiceal orifice and the                         rectum were photographed. Findings:      The perianal and digital  rectal examinations were normal.      There was evidence of a prior end-to-side ileo-colonic anastomosis at       the ileocecal valve. This was patent and was characterized by healthy       appearing mucosa.      A moderate amount of stool was found in the entire colon, making       visualization difficult.      The exam was otherwise without abnormality on direct and retroflexion       views. Impression:            - Preparation of the colon was poor.                        - Patent end-to-side ileo-colonic anastomosis,                         characterized by healthy appearing mucosa.                        - Stool in the entire examined colon.                        - The examination was otherwise normal on direct and                         retroflexion views.                        - No specimens collected. Recommendation:        - Discharge patient to home.                        - Resume previous diet.                        -  Continue present medications.                        - Repeat colonoscopy in 6 months because the bowel                         preparation was suboptimal.                        - Return to referring physician as previously                         scheduled. Procedure Code(s):     --- Professional ---                        M5784, Colorectal cancer screening; colonoscopy on                         individual at high risk Diagnosis Code(s):     --- Professional ---                        Z86.010, Personal history of colonic polyps                        Z98.0, Intestinal bypass and anastomosis status                        Z80.0, Family history of malignant neoplasm of                         digestive organs CPT copyright 2022 American Medical Association. All rights reserved. The codes documented in this report are preliminary and upon coder review may  be revised to meet current compliance requirements. Eather Colas MD, MD 10/18/2022 2:11:28  PM Number of Addenda: 0 Note Initiated On: 10/18/2022 1:21 PM Scope Withdrawal Time: 0 hours 4 minutes 29 seconds  Total Procedure Duration: 0 hours 12 minutes 6 seconds  Estimated Blood Loss:  Estimated blood loss: none.      [facility]

## 2022-10-19 ENCOUNTER — Encounter: Payer: Self-pay | Admitting: Gastroenterology

## 2022-10-20 NOTE — Anesthesia Postprocedure Evaluation (Signed)
Anesthesia Post Note  Patient: Amber Christensen  Procedure(s) Performed: COLONOSCOPY WITH PROPOFOL ESOPHAGOGASTRODUODENOSCOPY (EGD) WITH PROPOFOL  Patient location during evaluation: Endoscopy Anesthesia Type: General Level of consciousness: awake and alert Pain management: pain level controlled Vital Signs Assessment: post-procedure vital signs reviewed and stable Respiratory status: spontaneous breathing, nonlabored ventilation, respiratory function stable and patient connected to nasal cannula oxygen Cardiovascular status: blood pressure returned to baseline and stable Postop Assessment: no apparent nausea or vomiting Anesthetic complications: no   No notable events documented.   Last Vitals:  Vitals:   10/18/22 1223 10/18/22 1400  BP: (!) 145/94 113/65  Pulse: (!) 56 72  Resp: 18 15  Temp: (!) 35.7 C (!) 35.6 C  SpO2: 100% 100%    Last Pain:  Vitals:   10/19/22 0732  TempSrc:   PainSc: 0-No pain                 Cleda Mccreedy Illene Sweeting

## 2022-11-01 ENCOUNTER — Encounter (HOSPITAL_COMMUNITY): Payer: Self-pay

## 2022-11-02 ENCOUNTER — Other Ambulatory Visit: Payer: Self-pay | Admitting: Internal Medicine

## 2022-11-02 ENCOUNTER — Ambulatory Visit
Admission: RE | Admit: 2022-11-02 | Discharge: 2022-11-02 | Disposition: A | Discharge status: Home / Self Care | Payer: 59 | Source: Ambulatory Visit | Attending: Internal Medicine | Admitting: Internal Medicine

## 2022-11-02 DIAGNOSIS — R0602 Shortness of breath: Secondary | ICD-10-CM | POA: Insufficient documentation

## 2022-11-02 MED ORDER — GADOBUTROL 1 MMOL/ML IV SOLN
10.0000 mL | Freq: Once | INTRAVENOUS | Status: AC | PRN
  Administered 2022-11-02: 10 mL via INTRAVENOUS

## 2022-11-02 MED ORDER — HEPARIN SOD (PORK) LOCK FLUSH 100 UNIT/ML IV SOLN
500.0000 [IU] | Freq: Once | INTRAVENOUS | Status: AC
  Administered 2022-11-02: 500 [IU] via INTRAVENOUS
  Filled 2022-11-02: qty 5

## 2022-11-02 MED ORDER — HEPARIN SOD (PORK) LOCK FLUSH 100 UNIT/ML IV SOLN
INTRAVENOUS | Status: AC
  Filled 2022-11-02: qty 5

## 2023-01-11 ENCOUNTER — Ambulatory Visit
Admission: RE | Admit: 2023-01-11 | Discharge: 2023-01-11 | Disposition: A | Discharge status: Home / Self Care | Payer: 59 | Source: Ambulatory Visit | Attending: Internal Medicine | Admitting: Internal Medicine

## 2023-01-11 DIAGNOSIS — Z452 Encounter for adjustment and management of vascular access device: Secondary | ICD-10-CM | POA: Insufficient documentation

## 2023-01-11 MED ORDER — HEPARIN SOD (PORK) LOCK FLUSH 100 UNIT/ML IV SOLN
INTRAVENOUS | Status: AC
  Filled 2023-01-11: qty 5

## 2023-01-11 MED ORDER — HEPARIN SOD (PORK) LOCK FLUSH 100 UNIT/ML IV SOLN: 500.0000 [IU] | INTRAVENOUS | Status: DC | PRN

## 2023-01-11 MED ORDER — SODIUM CHLORIDE 0.9% FLUSH: 10.0000 mL | Freq: Once | INTRAVENOUS | Status: DC

## 2023-02-22 ENCOUNTER — Inpatient Hospital Stay
Admission: RE | Admit: 2023-02-22 | Discharge: 2023-02-22 | Disposition: A | Discharge status: Home / Self Care | Payer: 59 | Source: Ambulatory Visit

## 2023-02-23 MED FILL — Heparin Sodium (Porcine) Lock Flush PF IV Soln 100 Unit/ML: INTRAVENOUS | Qty: 5 | Status: AC
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
## 2023-04-14 ENCOUNTER — Encounter (HOSPITAL_COMMUNITY): Payer: Self-pay

## 2023-04-14 ENCOUNTER — Other Ambulatory Visit: Payer: Self-pay | Admitting: Podiatry

## 2023-04-14 ENCOUNTER — Other Ambulatory Visit: Payer: Self-pay

## 2023-04-14 ENCOUNTER — Emergency Department (HOSPITAL_COMMUNITY)
Admission: EM | Admit: 2023-04-14 | Discharge: 2023-04-14 | Discharge status: Against Medical Advice | Payer: 59 | Attending: Emergency Medicine | Admitting: Emergency Medicine

## 2023-04-14 ENCOUNTER — Emergency Department (HOSPITAL_COMMUNITY): Payer: 59

## 2023-04-14 DIAGNOSIS — M25561 Pain in right knee: Secondary | ICD-10-CM | POA: Insufficient documentation

## 2023-04-14 DIAGNOSIS — Z5321 Procedure and treatment not carried out due to patient leaving prior to being seen by health care provider: Secondary | ICD-10-CM | POA: Diagnosis not present

## 2023-04-14 DIAGNOSIS — W19XXXA Unspecified fall, initial encounter: Secondary | ICD-10-CM | POA: Insufficient documentation

## 2023-04-14 DIAGNOSIS — M79604 Pain in right leg: Secondary | ICD-10-CM

## 2023-04-14 NOTE — ED Notes (Signed)
 Pt has paper order for Ultrasound to rule out DVT in injured leg.

## 2023-04-14 NOTE — ED Triage Notes (Signed)
 Pt was leaving to go to appt.for ultrasound to rule out DVT after breaking foot a week ago. Pt fell on the way out and landed on right knee. Pt stated that her leg is very painful. No new symptoms from most recent fall.

## 2023-04-18 ENCOUNTER — Ambulatory Visit
Admission: RE | Admit: 2023-04-18 | Discharge: 2023-04-18 | Disposition: A | Discharge status: Home / Self Care | Payer: Worker's Compensation | Source: Ambulatory Visit | Attending: Podiatry | Admitting: Podiatry

## 2023-04-18 DIAGNOSIS — M79604 Pain in right leg: Secondary | ICD-10-CM | POA: Diagnosis present

## 2023-04-24 ENCOUNTER — Other Ambulatory Visit: Payer: Self-pay | Admitting: Podiatry

## 2023-04-24 DIAGNOSIS — S92314D Nondisplaced fracture of first metatarsal bone, right foot, subsequent encounter for fracture with routine healing: Secondary | ICD-10-CM

## 2023-04-28 ENCOUNTER — Other Ambulatory Visit: Payer: Self-pay | Admitting: Sports Medicine

## 2023-04-28 DIAGNOSIS — W19XXXA Unspecified fall, initial encounter: Secondary | ICD-10-CM

## 2023-05-01 ENCOUNTER — Ambulatory Visit
Admission: RE | Admit: 2023-05-01 | Discharge: 2023-05-01 | Disposition: A | Discharge status: Home / Self Care | Payer: Worker's Compensation | Source: Ambulatory Visit | Attending: Podiatry | Admitting: Podiatry

## 2023-05-01 DIAGNOSIS — S92314D Nondisplaced fracture of first metatarsal bone, right foot, subsequent encounter for fracture with routine healing: Secondary | ICD-10-CM

## 2023-05-11 ENCOUNTER — Ambulatory Visit

## 2023-05-24 ENCOUNTER — Other Ambulatory Visit: Payer: Self-pay | Admitting: Student

## 2023-05-24 DIAGNOSIS — H538 Other visual disturbances: Secondary | ICD-10-CM

## 2023-05-24 DIAGNOSIS — M542 Cervicalgia: Secondary | ICD-10-CM

## 2023-05-24 DIAGNOSIS — R2689 Other abnormalities of gait and mobility: Secondary | ICD-10-CM

## 2023-05-24 DIAGNOSIS — R519 Headache, unspecified: Secondary | ICD-10-CM

## 2023-05-24 DIAGNOSIS — R296 Repeated falls: Secondary | ICD-10-CM

## 2023-06-08 ENCOUNTER — Other Ambulatory Visit: Payer: Self-pay | Admitting: Internal Medicine

## 2023-06-08 DIAGNOSIS — Z1231 Encounter for screening mammogram for malignant neoplasm of breast: Secondary | ICD-10-CM

## 2023-06-13 ENCOUNTER — Ambulatory Visit
Admission: RE | Admit: 2023-06-13 | Discharge: 2023-06-13 | Disposition: A | Discharge status: Home / Self Care | Source: Ambulatory Visit | Attending: Internal Medicine | Admitting: Internal Medicine

## 2023-06-13 ENCOUNTER — Ambulatory Visit
Admission: RE | Admit: 2023-06-13 | Discharge: 2023-06-13 | Disposition: A | Discharge status: Home / Self Care | Source: Ambulatory Visit | Attending: Student | Admitting: Student

## 2023-06-13 DIAGNOSIS — Z95828 Presence of other vascular implants and grafts: Secondary | ICD-10-CM | POA: Insufficient documentation

## 2023-06-13 DIAGNOSIS — R519 Headache, unspecified: Secondary | ICD-10-CM

## 2023-06-13 DIAGNOSIS — M542 Cervicalgia: Secondary | ICD-10-CM

## 2023-06-13 DIAGNOSIS — R2689 Other abnormalities of gait and mobility: Secondary | ICD-10-CM

## 2023-06-13 DIAGNOSIS — R296 Repeated falls: Secondary | ICD-10-CM

## 2023-06-13 DIAGNOSIS — H538 Other visual disturbances: Secondary | ICD-10-CM

## 2023-06-13 MED ORDER — HEPARIN SOD (PORK) LOCK FLUSH 100 UNIT/ML IV SOLN
500.0000 [IU] | INTRAVENOUS | Status: AC | PRN
  Administered 2023-06-13: 500 [IU]

## 2023-06-13 MED ORDER — GADOPICLENOL 0.5 MMOL/ML IV SOLN
7.5000 mL | Freq: Once | INTRAVENOUS | Status: AC | PRN
  Administered 2023-06-13: 7.5 mL via INTRAVENOUS

## 2023-06-14 ENCOUNTER — Encounter: Payer: Self-pay | Admitting: Internal Medicine

## 2023-06-14 ENCOUNTER — Ambulatory Visit

## 2023-06-16 ENCOUNTER — Other Ambulatory Visit: Payer: Self-pay | Admitting: Internal Medicine

## 2023-06-16 DIAGNOSIS — M7989 Other specified soft tissue disorders: Secondary | ICD-10-CM

## 2023-06-16 DIAGNOSIS — N644 Mastodynia: Secondary | ICD-10-CM

## 2023-06-22 ENCOUNTER — Ambulatory Visit
Admission: RE | Admit: 2023-06-22 | Discharge: 2023-06-22 | Disposition: A | Discharge status: Home / Self Care | Source: Ambulatory Visit | Attending: Internal Medicine | Admitting: Internal Medicine

## 2023-06-22 DIAGNOSIS — M7989 Other specified soft tissue disorders: Secondary | ICD-10-CM

## 2023-06-22 DIAGNOSIS — N644 Mastodynia: Secondary | ICD-10-CM

## 2023-07-25 ENCOUNTER — Ambulatory Visit
Admission: RE | Admit: 2023-07-25 | Discharge: 2023-07-25 | Disposition: A | Discharge status: Home / Self Care | Source: Ambulatory Visit | Attending: Vascular Surgery | Admitting: Vascular Surgery

## 2023-07-25 DIAGNOSIS — E1140 Type 2 diabetes mellitus with diabetic neuropathy, unspecified: Secondary | ICD-10-CM | POA: Diagnosis not present

## 2023-07-25 DIAGNOSIS — I10 Essential (primary) hypertension: Secondary | ICD-10-CM | POA: Insufficient documentation

## 2023-07-25 MED ORDER — HEPARIN SOD (PORK) LOCK FLUSH 100 UNIT/ML IV SOLN: 500.0000 [IU] | Freq: Once | INTRAVENOUS | Status: DC

## 2023-07-25 MED ORDER — SODIUM CHLORIDE 0.9% FLUSH
10.0000 mL | Freq: Once | INTRAVENOUS | Status: AC
  Administered 2023-07-25: 10 mL

## 2023-07-25 MED ORDER — HEPARIN SOD (PORK) LOCK FLUSH 100 UNIT/ML IV SOLN
250.0000 [IU] | INTRAVENOUS | Status: AC | PRN
  Administered 2023-07-25: 250 [IU]

## 2023-07-25 MED ORDER — HEPARIN SOD (PORK) LOCK FLUSH 100 UNIT/ML IV SOLN
INTRAVENOUS | Status: AC
  Filled 2023-07-25: qty 5

## 2023-08-23 ENCOUNTER — Other Ambulatory Visit: Payer: Self-pay | Admitting: Orthopedic Surgery

## 2023-08-25 ENCOUNTER — Other Ambulatory Visit: Payer: Self-pay

## 2023-08-25 ENCOUNTER — Encounter: Payer: Self-pay | Admitting: Urgent Care

## 2023-08-25 ENCOUNTER — Encounter
Admission: RE | Admit: 2023-08-25 | Discharge: 2023-08-25 | Disposition: A | Discharge status: Home / Self Care | Payer: Worker's Compensation | Source: Ambulatory Visit | Attending: Orthopedic Surgery | Admitting: Orthopedic Surgery

## 2023-08-25 VITALS — Ht 64.0 in | Wt 174.0 lb

## 2023-08-25 DIAGNOSIS — Z8673 Personal history of transient ischemic attack (TIA), and cerebral infarction without residual deficits: Secondary | ICD-10-CM | POA: Diagnosis not present

## 2023-08-25 DIAGNOSIS — Z01818 Encounter for other preprocedural examination: Secondary | ICD-10-CM | POA: Diagnosis not present

## 2023-08-25 DIAGNOSIS — Z01810 Encounter for preprocedural cardiovascular examination: Secondary | ICD-10-CM | POA: Diagnosis present

## 2023-08-25 DIAGNOSIS — Z01812 Encounter for preprocedural laboratory examination: Secondary | ICD-10-CM

## 2023-08-25 DIAGNOSIS — I700 Atherosclerosis of aorta: Secondary | ICD-10-CM | POA: Diagnosis not present

## 2023-08-25 DIAGNOSIS — E876 Hypokalemia: Secondary | ICD-10-CM

## 2023-08-25 DIAGNOSIS — I10 Essential (primary) hypertension: Secondary | ICD-10-CM | POA: Insufficient documentation

## 2023-08-25 HISTORY — DX: Nontoxic multinodular goiter: E04.2

## 2023-08-25 HISTORY — DX: Organ-limited amyloidosis: E85.4

## 2023-08-25 HISTORY — DX: Migraine, unspecified, not intractable, without status migrainosus: G43.909

## 2023-08-25 HISTORY — DX: Methicillin resistant Staphylococcus aureus infection, unspecified site: A49.02

## 2023-08-25 HISTORY — DX: Cyst of kidney, acquired: N28.1

## 2023-08-25 HISTORY — DX: Essential (primary) hypertension: I10

## 2023-08-25 HISTORY — DX: Atherosclerosis of aorta: I70.0

## 2023-08-25 HISTORY — DX: Carpal tunnel syndrome, left upper limb: G56.02

## 2023-08-25 HISTORY — DX: Diverticulosis of intestine, part unspecified, without perforation or abscess without bleeding: K57.90

## 2023-08-25 HISTORY — DX: Gilbert syndrome: E80.4

## 2023-08-25 HISTORY — DX: Mixed hyperlipidemia: E78.2

## 2023-08-25 HISTORY — DX: Acute pancreatitis with uninfected necrosis, unspecified: K85.91

## 2023-08-25 HISTORY — DX: Polyneuropathy, unspecified: G62.9

## 2023-08-25 LAB — BASIC METABOLIC PANEL WITH GFR
Anion gap: 12 (ref 5–15)
BUN: 19 mg/dL (ref 6–20)
CO2: 25 mmol/L (ref 22–32)
Calcium: 8.8 mg/dL — ABNORMAL LOW (ref 8.9–10.3)
Chloride: 101 mmol/L (ref 98–111)
Creatinine, Ser: 0.74 mg/dL (ref 0.44–1.00)
GFR, Estimated: >60 mL/min (ref >60)
Glucose, Bld: 171 mg/dL — ABNORMAL HIGH (ref 70–99)
Potassium: 3.2 mmol/L — ABNORMAL LOW (ref 3.5–5.1)
Sodium: 138 mmol/L (ref 135–145)

## 2023-08-25 LAB — CBC
HCT: 37.4 % (ref 36.0–46.0)
Hemoglobin: 13.3 g/dL (ref 12.0–15.0)
MCH: 30.4 pg (ref 26.0–34.0)
MCHC: 35.6 g/dL (ref 30.0–36.0)
MCV: 85.6 fL (ref 80.0–100.0)
Platelets: 241 K/uL (ref 150–400)
RBC: 4.37 MIL/uL (ref 3.87–5.11)
RDW: 12.5 % (ref 11.5–15.5)
WBC: 11.2 K/uL — ABNORMAL HIGH (ref 4.0–10.5)
nRBC: 0 % (ref 0.0–0.2)

## 2023-08-25 LAB — MAGNESIUM: Magnesium: 1.7 mg/dL (ref 1.7–2.4)

## 2023-08-25 MED ORDER — HEPARIN SOD (PORK) LOCK FLUSH 100 UNIT/ML IV SOLN: 250.0000 [IU] | INTRAVENOUS | Status: DC | PRN

## 2023-08-25 MED ORDER — SODIUM CHLORIDE 0.9% FLUSH
10.0000 mL | Freq: Once | INTRAVENOUS | Status: DC
  Filled 2023-08-25: qty 10

## 2023-08-25 MED ORDER — POTASSIUM CHLORIDE CRYS ER 10 MEQ PO TBCR: EXTENDED_RELEASE_TABLET | ORAL | 0 refills | Status: AC

## 2023-08-25 NOTE — Patient Instructions (Addendum)
 Your procedure is scheduled on: Tuesday, July 15 Report to the Registration Desk on the 1st floor of the [facility] To find out your arrival time, please call [phone] between 1PM - 3PM on: Monday, July 14 If your arrival time is 6:00 am, do not arrive before that time as the [facility] entrance doors do not open until 6:00 am.  REMEMBER: Instructions that are not followed completely may result in serious medical risk, up to and including death; or upon the discretion of your surgeon and anesthesiologist your surgery may need to be rescheduled.  Do not eat food after midnight the night before surgery.  No gum chewing or hard candies.  You may however, drink water up to 2 hours before you are scheduled to arrive for your surgery. Do not drink anything within 2 hours of your scheduled arrival time.  In addition, your doctor has ordered for you to drink the provided:  Gatorade G2 Drinking this carbohydrate drink up to two hours before surgery helps to reduce insulin  resistance and improve patient outcomes. Please complete drinking 2 hours before scheduled arrival time.  One week prior to surgery: starting today, July 11 Stop Anti-inflammatories (NSAIDS) such as Advil, Aleve , Ibuprofen, Motrin, Naproxen , Naprosyn  and Aspirin  based products such as Excedrin, Goody's Powder, BC Powder. Stop ANY OVER THE COUNTER supplements until after surgery.  You may however, continue to take Tylenol  if needed for pain up until the day of surgery.  Per Dr. Bernita secure chat: stop aspirin  325 mg 7 days before surgery. Surgery is scheduled in 4 days. Do NOT take anymore aspirin . Resume 2 days AFTER surgery.  Continue taking all of your other prescription medications up until the day of surgery.  ON THE DAY OF SURGERY ONLY TAKE THESE MEDICATIONS WITH SIPS OF WATER:  amLODipine  (NORVASC )  metoprolol  succinate  pantoprazole  (PROTONIX )   Keep insulin  pump on and functioning.  No Alcohol for  24 hours before or after surgery.  No Smoking including e-cigarettes for 24 hours before surgery.  No chewable tobacco products for at least 6 hours before surgery.  No nicotine patches on the day of surgery.  Do not use any recreational drugs for at least a week (preferably 2 weeks) before your surgery.  Please be advised that the combination of cocaine and anesthesia may have negative outcomes, up to and including death. If you test positive for cocaine, your surgery will be cancelled.  On the morning of surgery brush your teeth with toothpaste and water, you may rinse your mouth with mouthwash if you wish. Do not swallow any toothpaste or mouthwash.  Use CHG Soap as directed on instruction sheet.  Do not wear jewelry, make-up, hairpins, clips or nail polish.  For welded (permanent) jewelry: bracelets, anklets, waist bands, etc.  Please have this removed prior to surgery.  If it is not removed, there is a chance that hospital personnel will need to cut it off on the day of surgery.  Do not wear lotions, powders, or perfumes.   Do not shave body hair from the neck down 48 hours before surgery.  Contact lenses, hearing aids and dentures may not be worn into surgery.  Do not bring valuables to the hospital. [facility] is not responsible for any missing/lost belongings or valuables.   Notify your doctor if there is any change in your medical condition (cold, fever, infection).  Wear comfortable clothing (specific to your surgery type) to the hospital.  After surgery, you can help  prevent lung complications by doing breathing exercises.  Take deep breaths and cough every 1-2 hours. Your doctor may order a device called an Incentive Spirometer to help you take deep breaths.  If you are being discharged the day of surgery, you will not be allowed to drive home. You will need a responsible individual to drive you home and stay with you for 24 hours after surgery.   If you are taking  public transportation, you will need to have a responsible individual with you.  Please call the Pre-admissions Testing Dept. at [phone] if you have any questions about these instructions.  Surgery Visitation Policy:  Patients having surgery or a procedure may have two visitors.  Children under the age of 52 must have an adult with them who is not the patient.   Merchandiser, retail to address health-related social needs:  [email or web address].no     Preparing for Surgery with CHLORHEXIDINE  GLUCONATE (CHG) Soap  Chlorhexidine  Gluconate (CHG) Soap  o An antiseptic cleaner that kills germs and bonds with the skin to continue killing germs even after washing  o Used for showering the night before surgery and morning of surgery  Before surgery, you can play an important role by reducing the number of germs on your skin.  CHG (Chlorhexidine  gluconate) soap is an antiseptic cleanser which kills germs and bonds with the skin to continue killing germs even after washing.  Please do not use if you have an allergy to CHG or antibacterial soaps. If your skin becomes reddened/irritated stop using the CHG.  1. Shower the NIGHT BEFORE SURGERY and the MORNING OF SURGERY with CHG soap.  2. If you choose to wash your hair, wash your hair first as usual with your normal shampoo.  3. After shampooing, rinse your hair and body thoroughly to remove the shampoo.  4. Use CHG as you would any other liquid soap. You can apply CHG directly to the skin and wash gently with a scrungie or a clean washcloth.  5. Apply the CHG soap to your body only from the neck down. Do not use on open wounds or open sores. Avoid contact with your eyes, ears, mouth, and genitals (private parts). Wash face and genitals (private parts) with your normal soap.  6. Wash thoroughly, paying special attention to the area where your surgery will be performed.  7. Thoroughly rinse your body with warm  water.  8. Do not shower/wash with your normal soap after using and rinsing off the CHG soap.  9. Pat yourself dry with a clean towel.  10. Wear clean pajamas to bed the night before surgery.  12. Place clean sheets on your bed the night of your first shower and do not sleep with pets.  13. Shower again with the CHG soap on the day of surgery prior to arriving at the hospital.  14. Do not apply any deodorants/lotions/powders.  15. Please wear clean clothes to the hospital.

## 2023-08-25 NOTE — Progress Notes (Signed)
 [facility] Perioperative Services: Pre-Admission/Anesthesia Testing  Abnormal Lab Notification and Treatment Plan of Care   Date: 08/25/23  Name: Amber Christensen DOB: 04/25/1965 MRN:   [id number]  Re: Abnormal labs noted during PAT appointment   Notified:  Provider Name Provider Role Notification Mode  Tobie Priest, MD Orthopedics (Surgeon) Routed and/or faxed via [facility]ANE[facility] Sadie Manna, MD Primary Care Provider Routed and/or faxed via [facility]   Clinical Information and Notes:  ABNORMAL LAB VALUE(S): Lab Results  Component Value Date   K 3.2 (L) 08/25/2023   Amber Christensen is scheduled for an elective ARTHROSCOPY, KNEE, WITH MEDIAL MENISCECTOMY (Right: Knee) ARTHROSCOPY, KNEE, WITH PLICA EXCISION (Right: Knee) on 08/29/2023. In review of her medication reconciliation, it is noted that the patient is not taking prescribed diuretic medications.   Please note, in efforts to promote a safe and effective anesthetic course, per current guidelines/standards set by the [facility] anesthesia team, the minimal acceptable K+ level for the patient to proceed with general anesthesia is 3.0 mmol/L. With that being said, if the patient drops any lower, her elective procedure will need to be postponed until K+ is better optimized. In efforts to prevent case cancellation, and ultimately to promote the safety of this patient undergoing sedation/anesthesia, will make efforts to optimize pre-surgical K+ level allowing the surgical intervention to proceed as planned.    Impression and Plan:  Amber Christensen found to be HYPOkalemic at 3.2 mmol/L on preoperative labs.   Mg level was added and found to be normal at: 1.7 mg/dL  She is on not on diuretic therapy or daily K+ supplemental despite chronic hypokalemia. She does have a history of IBS-C/D. Gastrointestinal loss could certainly be contributory; this was discussed with patient. Patient denies regular use of laxative medications.  Reviewed other potential causes, including decreased intake of dietary K+ and warmer weather resulting in increased insensible losses. Reviewed plans for short term preoperative optimization as follows:   Meds ordered this encounter  Medications   potassium chloride  SA (KLOR-CON  M) 10 MEQ tablet    Sig: Take 2 tablets (20 mEq) today, then take 1 tablet (10 mEq) daily until surgery. Be sure to take dose on day of surgery. Follow up with PCP for repeat labs.    Dispense:  6 tablet    Refill:  0    Please contact the patient as soon as it is available for pickup. Rx is for preoperative K+ optimization and needs to be started ASAP.   Encouraged patient to follow up with PCP about 2-3 weeks postoperatively to have labs rechecked to ensure that levels are remaining within normal range. Discussed nutritional intake of K+ rich foods as an adjunctive way to keep her K+ levels normal; list of K+ rich foods provided. Also mentioned ORS, however advised her not to rely solely on these drinks, as they are high in Na+, and she has a HTN diagnosis.   Will send copy of this note to surgeon and PCP to make them aware of K+ level and plans for correction. Discussed that PCP may elect to pursue adding a daily K+ supplement if levels remain low on recheck. Order entered to recheck K+ on the day of her surgery to ensure optimization. Wished patient the best of luck with her upcoming surgery and subsequent recovery. She was encouraged to return call to the PAT clinic, or to her surgeon's office, should any questions or concerns arise between now and the time of her surgery.  Encounter Diagnoses  Name Primary?   Pre-operative laboratory examination Yes   Chronic hypokalemia    Dorise Pereyra, MSN, APRN, FNP-C, CEN [facility]  Perioperative Services Nurse Practitioner Phone: [phone] 08/25/23 4:00 PM  NOTE: This note has been prepared using Dragon dictation software. Despite my best ability to  proofread, there is always the potential that unintentional transcriptional errors may still occur from this process.

## 2023-08-29 ENCOUNTER — Ambulatory Visit
Admission: RE | Admit: 2023-08-29 | Payer: Worker's Compensation | Source: Home / Self Care | Admitting: Orthopedic Surgery

## 2023-08-29 ENCOUNTER — Encounter: Payer: Self-pay | Admitting: Urgent Care

## 2023-08-29 ENCOUNTER — Encounter: Admission: RE | Payer: Self-pay | Source: Home / Self Care

## 2023-08-29 DIAGNOSIS — Z01812 Encounter for preprocedural laboratory examination: Secondary | ICD-10-CM

## 2023-08-29 DIAGNOSIS — E876 Hypokalemia: Secondary | ICD-10-CM

## 2023-08-29 DIAGNOSIS — S83249A Other tear of medial meniscus, current injury, unspecified knee, initial encounter: Secondary | ICD-10-CM

## 2023-08-29 SURGERY — ARTHROSCOPY, KNEE, WITH MEDIAL MENISCECTOMY
Anesthesia: Choice | Site: Knee | Laterality: Right

## 2023-09-04 ENCOUNTER — Other Ambulatory Visit: Payer: Self-pay | Admitting: *Deleted

## 2023-09-04 ENCOUNTER — Other Ambulatory Visit: Payer: Self-pay | Admitting: Pharmacist

## 2023-09-05 ENCOUNTER — Encounter: Payer: Self-pay | Admitting: Orthopedic Surgery

## 2023-09-05 ENCOUNTER — Other Ambulatory Visit: Payer: Self-pay | Admitting: Orthopedic Surgery

## 2023-09-05 ENCOUNTER — Ambulatory Visit

## 2023-09-06 ENCOUNTER — Encounter: Payer: Self-pay | Admitting: Anesthesiology

## 2023-09-06 ENCOUNTER — Encounter: Payer: Self-pay | Admitting: Orthopedic Surgery

## 2023-09-06 NOTE — Anesthesia Preprocedure Evaluation (Addendum)
 Anesthesia Evaluation    Airway        Dental   Pulmonary           Cardiovascular hypertension,      Neuro/Psych    GI/Hepatic   Endo/Other  diabetes    Renal/GU      Musculoskeletal   Abdominal   Peds  Hematology   Anesthesia Other Findings Have letter from cardiologist that patient does not have cardiac amyloidosis, and can proceed with surgery.  essential hypertension  NASH (nonalcoholic steatohepatitis) GERD (gastroesophageal reflux disease) Pancreatitis Depression  Anxiety Blood transfusion without reported diagnosis  Multiple thyroid  nodules 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HCC)  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HCC) Allergy  Bladder mass Breast mass  Chronic pancreatitis s/p subtotal pancreatectomy Cirrhosis (HCC)  Esophagitis Irritable bowel syndrome  Dyspnea Diabetes mellitus type 1, with complication, on long term insulin  pump (HCC) Anemia Migraines  Bipolar disorder (HCC) Carpal tunnel syndrome, left  Anginal pain (HCC) TIA (transient ischemic attack) Renal cyst, left Cervical cancer (HCC)  PONV (postoperative nausea and vomiting) Gilbert's syndrome  Cardiac amyloidosis (HCC) MRSA infection  Abdominal aortic atherosclerosis (HCC) Diverticulosis  Necrotizing pancreatitis Peripheral neuropathy  Mixed hyperlipidemia Stroke Difficult intravenous access    Reproductive/Obstetrics                              Anesthesia Physical Anesthesia Plan Anesthesia Quick Evaluation

## 2023-09-18 ENCOUNTER — Encounter: Payer: Self-pay | Admitting: Anesthesiology

## 2023-09-18 ENCOUNTER — Ambulatory Visit
Admission: RE | Admit: 2023-09-18 | Payer: Worker's Compensation | Source: Home / Self Care | Admitting: Orthopedic Surgery

## 2023-09-18 ENCOUNTER — Encounter: Admission: RE | Payer: Self-pay | Source: Home / Self Care

## 2023-09-18 HISTORY — DX: Other specified health status: Z78.9

## 2023-09-18 SURGERY — ARTHROSCOPY, KNEE, WITH MEDIAL MENISCECTOMY
Anesthesia: Choice | Site: Knee | Laterality: Right

## 2023-09-19 ENCOUNTER — Other Ambulatory Visit: Payer: Self-pay | Admitting: Orthopedic Surgery

## 2023-09-22 ENCOUNTER — Encounter
Admission: RE | Admit: 2023-09-22 | Discharge: 2023-09-22 | Disposition: A | Discharge status: Home / Self Care | Source: Ambulatory Visit | Attending: Orthopedic Surgery | Admitting: Orthopedic Surgery

## 2023-09-22 ENCOUNTER — Inpatient Hospital Stay: Admission: RE | Admit: 2023-09-22 | Source: Ambulatory Visit

## 2023-09-22 DIAGNOSIS — E876 Hypokalemia: Secondary | ICD-10-CM

## 2023-09-22 DIAGNOSIS — Z01812 Encounter for preprocedural laboratory examination: Secondary | ICD-10-CM

## 2023-09-22 DIAGNOSIS — S83206A Unspecified tear of unspecified meniscus, current injury, right knee, initial encounter: Secondary | ICD-10-CM

## 2023-09-22 DIAGNOSIS — E109 Type 1 diabetes mellitus without complications: Secondary | ICD-10-CM

## 2023-09-22 HISTORY — DX: Peripheral tear of medial meniscus, current injury, right knee, initial encounter: S83.221A

## 2023-09-26 ENCOUNTER — Other Ambulatory Visit: Payer: Self-pay

## 2023-09-26 ENCOUNTER — Ambulatory Visit

## 2023-09-26 ENCOUNTER — Ambulatory Visit
Admission: RE | Admit: 2023-09-26 | Discharge: 2023-09-26 | Disposition: A | Discharge status: Home / Self Care | Attending: Orthopedic Surgery | Admitting: Orthopedic Surgery

## 2023-09-26 ENCOUNTER — Encounter
Admission: RE | Disposition: A | Discharge status: Home / Self Care | Payer: Self-pay | Source: Home / Self Care | Attending: Orthopedic Surgery

## 2023-09-26 ENCOUNTER — Encounter: Payer: Self-pay | Admitting: Orthopedic Surgery

## 2023-09-26 DIAGNOSIS — M71561 Other bursitis, not elsewhere classified, right knee: Secondary | ICD-10-CM | POA: Diagnosis not present

## 2023-09-26 DIAGNOSIS — W010XXA Fall on same level from slipping, tripping and stumbling without subsequent striking against object, initial encounter: Secondary | ICD-10-CM | POA: Diagnosis not present

## 2023-09-26 DIAGNOSIS — Z9641 Presence of insulin pump (external) (internal): Secondary | ICD-10-CM | POA: Insufficient documentation

## 2023-09-26 DIAGNOSIS — I10 Essential (primary) hypertension: Secondary | ICD-10-CM | POA: Diagnosis not present

## 2023-09-26 DIAGNOSIS — E876 Hypokalemia: Secondary | ICD-10-CM

## 2023-09-26 DIAGNOSIS — S83231A Complex tear of medial meniscus, current injury, right knee, initial encounter: Secondary | ICD-10-CM | POA: Insufficient documentation

## 2023-09-26 DIAGNOSIS — K861 Other chronic pancreatitis: Secondary | ICD-10-CM | POA: Diagnosis not present

## 2023-09-26 DIAGNOSIS — Z794 Long term (current) use of insulin: Secondary | ICD-10-CM | POA: Diagnosis not present

## 2023-09-26 DIAGNOSIS — Z90411 Acquired partial absence of pancreas: Secondary | ICD-10-CM | POA: Insufficient documentation

## 2023-09-26 DIAGNOSIS — K7581 Nonalcoholic steatohepatitis (NASH): Secondary | ICD-10-CM | POA: Diagnosis not present

## 2023-09-26 DIAGNOSIS — K219 Gastro-esophageal reflux disease without esophagitis: Secondary | ICD-10-CM | POA: Insufficient documentation

## 2023-09-26 DIAGNOSIS — G629 Polyneuropathy, unspecified: Secondary | ICD-10-CM | POA: Insufficient documentation

## 2023-09-26 DIAGNOSIS — E109 Type 1 diabetes mellitus without complications: Secondary | ICD-10-CM

## 2023-09-26 DIAGNOSIS — E891 Postprocedural hypoinsulinemia: Secondary | ICD-10-CM | POA: Insufficient documentation

## 2023-09-26 DIAGNOSIS — I69354 Hemiplegia and hemiparesis following cerebral infarction affecting left non-dominant side: Secondary | ICD-10-CM | POA: Insufficient documentation

## 2023-09-26 DIAGNOSIS — E139 Other specified diabetes mellitus without complications: Secondary | ICD-10-CM | POA: Diagnosis not present

## 2023-09-26 DIAGNOSIS — M25861 Other specified joint disorders, right knee: Secondary | ICD-10-CM | POA: Insufficient documentation

## 2023-09-26 DIAGNOSIS — Z01812 Encounter for preprocedural laboratory examination: Secondary | ICD-10-CM

## 2023-09-26 HISTORY — PX: KNEE ARTHROSCOPY WITH MEDIAL MENISECTOMY: SHX5651

## 2023-09-26 HISTORY — PX: SYNOVECTOMY: SHX5180

## 2023-09-26 LAB — POCT I-STAT, CHEM 8
BUN: 13 mg/dL (ref 6–20)
Calcium, Ion: 1.07 mmol/L — ABNORMAL LOW (ref 1.15–1.40)
Chloride: 103 mmol/L (ref 98–111)
Creatinine, Ser: 0.9 mg/dL (ref 0.44–1.00)
Glucose, Bld: 118 mg/dL — ABNORMAL HIGH (ref 70–99)
HCT: 34 % — ABNORMAL LOW (ref 36.0–46.0)
Hemoglobin: 11.6 g/dL — ABNORMAL LOW (ref 12.0–15.0)
Potassium: 3.6 mmol/L (ref 3.5–5.1)
Sodium: 141 mmol/L (ref 135–145)
TCO2: 23 mmol/L (ref 22–32)

## 2023-09-26 LAB — GLUCOSE, CAPILLARY: Glucose-Capillary: 115 mg/dL — ABNORMAL HIGH (ref 70–99)

## 2023-09-26 SURGERY — ARTHROSCOPY, KNEE, WITH MEDIAL MENISCECTOMY
Anesthesia: General | Site: Knee | Laterality: Right

## 2023-09-26 MED ORDER — ACETAMINOPHEN 10 MG/ML IV SOLN
INTRAVENOUS | Status: AC
Start: 2023-09-26 — End: 2023-09-26
  Filled 2023-09-26: qty 100

## 2023-09-26 MED ORDER — LIDOCAINE-EPINEPHRINE 1 %-1:100000 IJ SOLN
INTRAMUSCULAR | Status: AC
  Filled 2023-09-26: qty 1

## 2023-09-26 MED ORDER — OXYCODONE HCL 5 MG PO TABS
5.0000 mg | ORAL_TABLET | ORAL | Status: AC | PRN
  Administered 2023-09-26 (×2): 5 mg via ORAL

## 2023-09-26 MED ORDER — CHLORHEXIDINE GLUCONATE 0.12 % MT SOLN
15.0000 mL | Freq: Once | OROMUCOSAL | Status: AC
  Administered 2023-09-26 (×2): 15 mL via OROMUCOSAL

## 2023-09-26 MED ORDER — LIDOCAINE HCL (PF) 2 % IJ SOLN
INTRAMUSCULAR | Status: AC
  Filled 2023-09-26: qty 5

## 2023-09-26 MED ORDER — MIDAZOLAM HCL 2 MG/2ML IJ SOLN
INTRAMUSCULAR | Status: DC | PRN
  Administered 2023-09-26 (×2): 2 mg via INTRAVENOUS

## 2023-09-26 MED ORDER — IBUPROFEN 800 MG PO TABS: 800.0000 mg | ORAL_TABLET | Freq: Three times a day (TID) | ORAL | 0 refills | Status: AC

## 2023-09-26 MED ORDER — SODIUM CHLORIDE 0.9 % IV SOLN: INTRAVENOUS | Status: DC

## 2023-09-26 MED ORDER — LIDOCAINE HCL (CARDIAC) PF 100 MG/5ML IV SOSY
PREFILLED_SYRINGE | INTRAVENOUS | Status: DC | PRN
  Administered 2023-09-26 (×2): 100 mg via INTRAVENOUS

## 2023-09-26 MED ORDER — ACETAMINOPHEN 10 MG/ML IV SOLN
INTRAVENOUS | Status: DC | PRN
  Administered 2023-09-26 (×2): 1000 mg via INTRAVENOUS

## 2023-09-26 MED ORDER — ONDANSETRON 4 MG PO TBDP: 4.0000 mg | ORAL_TABLET | Freq: Three times a day (TID) | ORAL | 0 refills | Status: AC | PRN

## 2023-09-26 MED ORDER — BUPIVACAINE HCL (PF) 0.5 % IJ SOLN
INTRAMUSCULAR | Status: AC
  Filled 2023-09-26: qty 30

## 2023-09-26 MED ORDER — ACETAMINOPHEN 500 MG PO TABS: 1000.0000 mg | ORAL_TABLET | Freq: Three times a day (TID) | ORAL | 2 refills | Status: AC

## 2023-09-26 MED ORDER — CEFAZOLIN SODIUM-DEXTROSE 2-4 GM/100ML-% IV SOLN
2.0000 g | INTRAVENOUS | Status: AC
  Administered 2023-09-26 (×2): 2 g via INTRAVENOUS

## 2023-09-26 MED ORDER — ASPIRIN 325 MG PO TBEC
325.0000 mg | DELAYED_RELEASE_TABLET | Freq: Every day | ORAL | 0 refills | Status: AC
Start: 2023-09-26 — End: 2023-10-10

## 2023-09-26 MED ORDER — PHENYLEPHRINE 80 MCG/ML (10ML) SYRINGE FOR IV PUSH (FOR BLOOD PRESSURE SUPPORT)
PREFILLED_SYRINGE | INTRAVENOUS | Status: AC
  Filled 2023-09-26: qty 10

## 2023-09-26 MED ORDER — FENTANYL CITRATE (PF) 100 MCG/2ML IJ SOLN
INTRAMUSCULAR | Status: AC
  Filled 2023-09-26: qty 2

## 2023-09-26 MED ORDER — FENTANYL CITRATE (PF) 100 MCG/2ML IJ SOLN
INTRAMUSCULAR | Status: DC | PRN
  Administered 2023-09-26 (×4): 50 ug via INTRAVENOUS

## 2023-09-26 MED ORDER — CHLORHEXIDINE GLUCONATE 0.12 % MT SOLN
OROMUCOSAL | Status: AC
  Filled 2023-09-26: qty 15

## 2023-09-26 MED ORDER — DROPERIDOL 2.5 MG/ML IJ SOLN: 0.6250 mg | Freq: Once | INTRAMUSCULAR | Status: DC | PRN

## 2023-09-26 MED ORDER — OXYCODONE HCL 5 MG PO TABS
ORAL_TABLET | ORAL | Status: AC
Start: 2023-09-26 — End: 2023-09-26
  Filled 2023-09-26: qty 1

## 2023-09-26 MED ORDER — LACTATED RINGERS IR SOLN
Status: DC | PRN
Start: 2023-09-26 — End: 2023-09-26
  Administered 2023-09-26 (×4): 3000 mL

## 2023-09-26 MED ORDER — KETOROLAC TROMETHAMINE 30 MG/ML IJ SOLN
INTRAMUSCULAR | Status: DC | PRN
  Administered 2023-09-26 (×2): 30 mg via INTRAVENOUS

## 2023-09-26 MED ORDER — ORAL CARE MOUTH RINSE: 15.0000 mL | Freq: Once | OROMUCOSAL | Status: AC

## 2023-09-26 MED ORDER — OXYCODONE HCL 5 MG PO TABS: 5.0000 mg | ORAL_TABLET | ORAL | 0 refills | Status: AC | PRN

## 2023-09-26 MED ORDER — PROPOFOL 10 MG/ML IV BOLUS
INTRAVENOUS | Status: DC | PRN
  Administered 2023-09-26: 150 mg via INTRAVENOUS
  Administered 2023-09-26: 125 ug/kg/min via INTRAVENOUS
  Administered 2023-09-26: 150 mg via INTRAVENOUS
  Administered 2023-09-26: 125 ug/kg/min via INTRAVENOUS
  Administered 2023-09-26 (×2): 100 ug/kg/min via INTRAVENOUS

## 2023-09-26 MED ORDER — PROPOFOL 1000 MG/100ML IV EMUL
INTRAVENOUS | Status: AC
  Filled 2023-09-26: qty 100

## 2023-09-26 MED ORDER — CEFAZOLIN SODIUM-DEXTROSE 2-4 GM/100ML-% IV SOLN
INTRAVENOUS | Status: AC
  Filled 2023-09-26: qty 100

## 2023-09-26 MED ORDER — MIDAZOLAM HCL 2 MG/2ML IJ SOLN
INTRAMUSCULAR | Status: AC
  Filled 2023-09-26: qty 2

## 2023-09-26 MED ORDER — PHENYLEPHRINE 80 MCG/ML (10ML) SYRINGE FOR IV PUSH (FOR BLOOD PRESSURE SUPPORT)
PREFILLED_SYRINGE | INTRAVENOUS | Status: DC | PRN
  Administered 2023-09-26 (×2): 160 ug via INTRAVENOUS

## 2023-09-26 MED ORDER — FENTANYL CITRATE (PF) 100 MCG/2ML IJ SOLN
25.0000 ug | INTRAMUSCULAR | Status: DC | PRN
  Administered 2023-09-26 (×8): 25 ug via INTRAVENOUS

## 2023-09-26 MED ORDER — LIDOCAINE-EPINEPHRINE 1 %-1:100000 IJ SOLN
INTRAMUSCULAR | Status: DC | PRN
  Administered 2023-09-26 (×2): 4 mL via INTRAMUSCULAR
  Administered 2023-09-26 (×2): 12 mL via INTRAMUSCULAR

## 2023-09-26 MED ORDER — KETOROLAC TROMETHAMINE 30 MG/ML IJ SOLN
INTRAMUSCULAR | Status: AC
  Filled 2023-09-26: qty 1

## 2023-09-26 MED ORDER — HEPARIN SOD (PORK) LOCK FLUSH 100 UNIT/ML IV SOLN
500.0000 [IU] | Freq: Once | INTRAVENOUS | Status: AC
  Administered 2023-09-26 (×2): 500 [IU]
  Filled 2023-09-26: qty 5

## 2023-09-26 SURGICAL SUPPLY — 38 items
ADAPTER IRRIG TUBE 2 SPIKE SOL (ADAPTER) ×2 IMPLANT
BLADE FULL RADIUS 3.5 (BLADE) IMPLANT
BLADE SHAVER 4.5X7 STR FR (MISCELLANEOUS) IMPLANT
BLADE SURG SZ11 CARB STEEL (BLADE) ×1 IMPLANT
BNDG COHESIVE 6X5 TAN ST LF (GAUZE/BANDAGES/DRESSINGS) ×1 IMPLANT
BNDG ELASTIC 6INX 5YD STR LF (GAUZE/BANDAGES/DRESSINGS) ×1 IMPLANT
BNDG ESMARCH 6X12 STRL LF (GAUZE/BANDAGES/DRESSINGS) ×1 IMPLANT
BUR BR 5.5 WIDE MOUTH (BURR) IMPLANT
CHLORAPREP W/TINT 26 (MISCELLANEOUS) ×1 IMPLANT
COOLER ICEMAN CLASSIC (MISCELLANEOUS) ×1 IMPLANT
COVER LIGHT HANDLE STERIS (MISCELLANEOUS) ×2 IMPLANT
CUFF TRNQT CYL 24X4X16.5-23 (TOURNIQUET CUFF) IMPLANT
CUFF TRNQT CYL 30X4X21-28X (TOURNIQUET CUFF) IMPLANT
DRAPE ARTHROSCOPY W/POUCH 90 (DRAPES) ×1 IMPLANT
DRAPE IMP U-DRAPE 54X76 (DRAPES) ×1 IMPLANT
ELECTRODE REM PT RTRN 9FT ADLT (ELECTROSURGICAL) IMPLANT
GAUZE SPONGE 4X4 12PLY STRL (GAUZE/BANDAGES/DRESSINGS) ×1 IMPLANT
GLOVE BIOGEL PI IND STRL 8 (GLOVE) ×1 IMPLANT
GLOVE SURG ORTHO 8.0 STRL STRW (GLOVE) ×2 IMPLANT
GOWN SRG LRG LVL 4 IMPRV REINF (GOWNS) ×1 IMPLANT
GOWN SRG XL LONG LVL 3 NONREIN (GOWNS) ×2 IMPLANT
IV LR IRRIG 3000ML ARTHROMATIC (IV SOLUTION) ×2 IMPLANT
KIT TURNOVER KIT A (KITS) ×1 IMPLANT
MANIFOLD NEPTUNE II (INSTRUMENTS) ×1 IMPLANT
MAT ABSORB FLUID 56X50 GRAY (MISCELLANEOUS) ×1 IMPLANT
PACK ARTHROSCOPY KNEE (MISCELLANEOUS) ×1 IMPLANT
PAD ABD DERMACEA PRESS 5X9 (GAUZE/BANDAGES/DRESSINGS) ×4 IMPLANT
PAD COLD UNI WRAP-ON (PAD) ×1 IMPLANT
PADDING CAST COTTON 6X4 STRL (CAST SUPPLIES) ×1 IMPLANT
SUT VIC AB 2-0 CT2 27 (SUTURE) IMPLANT
SUTURE EHLN 3-0 FS-10 30 BLK (SUTURE) ×1 IMPLANT
SUTURE MNCRL 4-0 27XMF (SUTURE) ×1 IMPLANT
TOWEL OR 17X26 4PK STRL BLUE (TOWEL DISPOSABLE) IMPLANT
TRAP FLUID SMOKE EVACUATOR (MISCELLANEOUS) IMPLANT
TUBE SET DOUBLEFLO INFLOW (TUBING) ×1 IMPLANT
TUBE SET DOUBLEFLO OUTFLOW (TUBING) ×1 IMPLANT
WAND WEREWOLF FLOW 90D (MISCELLANEOUS) ×1 IMPLANT
WATER STERILE IRR 500ML POUR (IV SOLUTION) ×1 IMPLANT

## 2023-09-26 NOTE — H&P (Signed)
 Paper H&P to be scanned into permanent record. H&P reviewed. No significant changes noted.

## 2023-09-26 NOTE — Transfer of Care (Signed)
 Immediate Anesthesia Transfer of Care Note  Patient: Amber Christensen  Procedure(s) Performed: ARTHROSCOPY, KNEE, WITH MEDIAL MENISCECTOMY (Right: Knee) SYNOVECTOMY (Right: Knee)  Patient Location: PACU  Anesthesia Type:General  Level of Consciousness: awake and drowsy  Airway & Oxygen Therapy: Patient Spontanous Breathing  Post-op Assessment: Report given to RN and Post -op Vital signs reviewed and stable  Post vital signs: Reviewed and stable  Last Vitals:  Vitals Value Taken Time  BP 117/66 09/26/23 08:30  Temp    Pulse 87 09/26/23 08:31  Resp 11 09/26/23 08:31  SpO2 94 % 09/26/23 08:31  Vitals shown include unfiled device data.  Last Pain:  Vitals:   09/26/23 0636  TempSrc: Temporal  PainSc: 6          Complications: There were no known notable events for this encounter.

## 2023-09-26 NOTE — Op Note (Signed)
 Operative Note    SURGERY DATE: 09/26/2023   PRE-OP DIAGNOSIS:  1. Right medial meniscus tear 2. Right knee intercondylar cyst   POST-OP DIAGNOSIS:  1. Right medial meniscus tear 2. Right knee intercondylar cyst   PROCEDURES:  1. Right knee arthroscopy, partial medial meniscectomy 2.  Right knee partial synovectomy with cyst excision   SURGEON: Earnestine HILARIO Blanch, MD  ASSISTANT: DOROTHA Krystal Doyne, PA    ANESTHESIA: Gen   ESTIMATED BLOOD LOSS: minimal   TOTAL IV FLUIDS: per anesthesia   INDICATION(S):  Amber Christensen is a 58 y.o. female with signs and symptoms as well as MRI finding of medial meniscus tear.  She had failed extensive nonoperative management options.  After discussion of risks, benefits, and alternatives to surgery, the patient elected to proceed.   OPERATIVE FINDINGS:    Examination under anesthesia: A careful examination under anesthesia was performed.  Passive range of motion was: Hyperextension: 2.  Extension: 0.  Flexion: 125.  Lachman: normal. Pivot Shift: normal.  Posterior drawer: normal.  Varus stability in full extension: normal.  Varus stability in 30 degrees of flexion: normal.  Valgus stability in full extension: normal.  Valgus stability in 30 degrees of flexion: normal.   Intra-operative findings: A thorough arthroscopic examination of the knee was performed.  The findings are: 1. Suprapatellar pouch: Normal 2. Undersurface of median ridge: Grade 2-3 degenerative changes superiorly 3. Medial patellar facet: Grade 1 softening 4. Lateral patellar facet: Grade 1 softening 5. Trochlea: Minimal central grade 1 degenerative changes 6. Lateral gutter/popliteus tendon: Normal 7. Hoffa's fat pad: Inflamed and synovitic 8. Medial gutter/plica: Normal 9. ACL: Normal, approximately 12 x 12 mm cyst anterior to ACL 10. PCL: Normal 11. Medial meniscus: Complex tear with primarily horizontal tear pattern affecting the full width of the posterior horn with extension to  the meniscus body 12. Medial compartment cartilage: Grade 2 degenerative changes to the tibial plateau; scattered grade 1 changes to the femoral condyle 13. Lateral meniscus: Normal 14. Lateral compartment cartilage: Grade 1-2 degenerative changes to the tibial plateau; normal femoral condyle   OPERATIVE REPORT:     I identified Amber Christensen in the pre-operative holding area. I marked the operative knee with my initials. I reviewed the risks and benefits of the proposed surgical intervention and the patient wished to proceed. The patient was transferred to the operative suite and placed in the supine position with all bony prominences padded.  Anesthesia was administered. Appropriate IV antibiotics were administered prior to incision. The extremity was then prepped and draped in standard fashion. A time out was performed confirming the correct extremity, correct patient, and correct procedure.   Arthroscopy portals were marked. Local anesthetic was injected to the planned portal sites. The anterolateral portal was established with an 11 blade.      The arthroscope was placed in the anterolateral portal and then into the suprapatellar pouch. Next, the medial portal was established under needle localization. A diagnostic knee scope was completed with the above findings. The medial meniscus tear and intercondylar cyst was identified.   First, partial synovectomy was performed by excising the inflamed and proliferative Hoffa's fat pad.  Next, the intercondylar cyst was debrided and excised using an oscillating shaver.  Then, the MCL was pie-crusted to improve visualization of the posterior horn. The meniscal tear was debrided using an arthroscopic biter and an oscillating shaver until the meniscus had stable borders. Arthroscopic fluid was removed from the joint.   The portals were  closed with 3-0 Nylon suture. Sterile dressings included Xeroform, 4x4s, Sof-Rol, and Bias wrap. A Polarcare was placed.   The patient was then awakened and taken to the PACU hemodynamically stable without complication.   Of note, assistance from a PA was essential to performing the surgery.  PA was present for the entire surgery.  PA assisted with patient positioning, retraction, instrumentation, and wound closure. The surgery would have been more difficult and had longer operative time without PA assistance.     POSTOPERATIVE PLAN: The patient will be discharged home today once they meet PACU criteria. Aspirin  325 mg daily was prescribed for 2 weeks for DVT prophylaxis.  Physical therapy will start on POD#3-4. Weight-bearing as tolerated. Follow up in 2 weeks per protocol.

## 2023-09-26 NOTE — Discharge Instructions (Signed)
 Arthroscopic Knee Surgery - Partial Meniscectomy   Post-Op Instructions   1. Bracing or crutches: Crutches will be provided at the time of discharge from the surgery center if you do not already have them.   2. Ice: You may be provided with a device [facility]) that allows you to ice the affected area effectively. Otherwise you can ice manually.    3. Driving:  Plan on not driving for at least two weeks. Please note that you are advised NOT to drive while taking narcotic pain medications as you may be impaired and unsafe to drive.   4. Activity: Ankle pumps several times an hour while awake to prevent blood clots. Weight bearing: as tolerated. Use crutches for as needed (usually ~1 week or less) until pain allows you to ambulate without a limp. Bending and straightening the knee is unlimited. Elevate knee above heart level as much as possible for one week. Avoid standing more than 5 minutes (consecutively) for the first week.  Avoid long distance travel for 2 weeks.  5. Medications:  - You have been provided a prescription for narcotic pain medicine. After surgery, take 1-2 narcotic tablets every 4 hours if needed for severe pain.  - You may take up to 3000mg /day of tylenol  (acetaminophen ). You can take 1000mg  3x/day. Please check your narcotic. If you have acetaminophen  in your narcotic (each tablet will be 325mg ), be careful not to exceed a total of 3000mg /day of acetaminophen .  - A prescription for anti-nausea medication will be provided in case the narcotic medicine or anesthesia causes nausea - take 1 tablet every 6 hours only if nauseated.  - Take ibuprofen  800 mg every 8 hours WITH food to reduce post-operative knee swelling. DO NOT STOP IBUPROFEN  POST-OP UNTIL INSTRUCTED TO DO SO at first post-op office visit (10-14 days after surgery). However, please discontinue if you have any abdominal discomfort after taking this.  - Take enteric coated aspirin  325 mg once daily for 2 weeks to prevent  blood clots.    6. Bandages: The physical therapist should change the bandages at the first post-op appointment. If needed, the dressing supplies have been provided to you.   7. Physical Therapy: 1-2 times per week for 6 weeks. Therapy typically starts on post operative Day 3 or 4. You have been provided an order for physical therapy. The therapist will provide home exercises.   8. Work: May do light duty/desk job in approximately 1-2 weeks when off of narcotics, pain is well-controlled, and swelling has decreased. Labor intensive jobs may require 4-6 weeks to return.      9. Post-Op Appointments: Your first post-op appointment will be with Dr. Tobie in approximately 2 weeks time.    If you find that they have not been scheduled please call the Orthopaedic Appointment front desk at [phone].

## 2023-09-26 NOTE — Anesthesia Preprocedure Evaluation (Signed)
 Anesthesia Evaluation  Patient identified by MRN, date of birth, ID band Patient awake    Reviewed: Allergy & Precautions, H&P , NPO status , Patient's Chart, lab work & pertinent test results  History of Anesthesia Complications (+) PONV and history of anesthetic complications  Airway Mallampati: III  TM Distance: >3 FB Neck ROM: full    Dental  (+) Chipped, Poor Dentition, Dental Advidsory Given,    Pulmonary shortness of breath and with exertion, neg sleep apnea, neg COPD, neg recent URI   Pulmonary exam normal        Cardiovascular hypertension, (-) angina (-) Past MI and (-) Cardiac Stents Normal cardiovascular exam(-) dysrhythmias (-) Valvular Problems/Murmurs  Cardiac amyloidosis  Per cardiology 8/24: Possible infiltrative process like cardiac sarcoid or cardiac amyloid, recommend cardiac MRI and SPEP, UPEP tests for further evaluation.  Chest pain, recent cardiac CTA was unremarkable, showed calcium  score of 0. Continue conservative management.  SOB, recent echo with estimated LVEF >55% and Myoview  and Holter were reasonably unremarkable, continue conservative management, follow up with pulmonology.   ECHO 04/26/22: MILD LV SYSTOLIC DYSFUNCTION (See above)  NORMAL RIGHT VENTRICULAR SYSTOLIC FUNCTION  MILD VALVULAR REGURGITATION (See above)  NO VALVULAR STENOSIS  ESTIMATED LVEF >55%  CALCULATED: 46.3%  GLS: -15.2%     Neuro/Psych neg Seizures PSYCHIATRIC DISORDERS Anxiety Depression Bipolar Disorder    Neuromuscular disease CVA (Left leg weaknes), Residual Symptoms    GI/Hepatic ,GERD  Controlled,,(+) Hepatitis - (NASH)Chronic pancreatitis  1999- received 20 units of blood after tearing esophagus from vomitting so severely after ERCP   Endo/Other  diabetes, Type 1, Insulin  Dependent  Autoimmune Addison's disease- pt unaware of diagnosis  Renal/GU Renal InsufficiencyRenal disease  negative genitourinary    Musculoskeletal   Abdominal   Peds  Hematology negative hematology ROS (+)   Anesthesia Other Findings Past Medical History: No date: Allergy     Comment:  many many allergies No date: Anemia     Comment:  in past 01/2018: Anginal pain (HCC)     Comment:  cardiac workup clear No date: Anxiety No date: 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HCC)     Comment:  pt unaware of this diagnosis No date: Bipolar disorder (HCC) No date: Bladder mass     Comment:  removed and had tbt. mesh removed 1999: Blood transfusion without reported diagnosis     Comment:  received 20 units of blood after tearing esophagus from               vomitting so severely after ERCP No date: Breast mass     Comment:  left side biopsy was negative No date: Carpal tunnel syndrome     Comment:  left hand 2002: Cervical cancer (HCC) No date: Chronic kidney disease     Comment:  cyst on left kidney No date: Chronic pancreatitis (HCC)     Comment:  prior to pancreas surgery No date: Cirrhosis (HCC) No date: Clotting disorder (HCC)     Comment:  pt states that sometimes she has difficulty stopping to               bleed and other times it is okay No date: Depression No date: Diabetes mellitus without complication (HCC)     Comment:  has insulin  pump No date: Dyspnea No date: Esophagitis No date: GERD (gastroesophageal reflux disease) No date: Headache     Comment:  migraines...takes compazine  for this No date: Heart disease No date: Hemophilia [facility])     Comment:  doctors think this was due to plavix  and having a dental              procedure which bled alot No date: Hypertension No date: Irritable bowel syndrome No date: Kidney mass     Comment:  just watching. left kidney mass. No date: Liver disease     Comment:  NASH. has had liver biopsies. negative except for NASH No date: NASH (nonalcoholic steatohepatitis) No date: NASH (nonalcoholic steatohepatitis) No date: Pancreatitis No date: PONV  (postoperative nausea and vomiting) 11/2017: Stroke (HCC)     Comment:  had tia. no longer needs plavix  No date: Thyroid  disease  Past Surgical History: No date: ABDOMINAL HYSTERECTOMY No date: APPENDECTOMY No date: BLADDER SURGERY     Comment:  mesh removed. revision which has caused everything to               fall again No date: BOWEL RESECTION     Comment:  took a piece of bowel out after pancreatectomy caused               problem with bowel.  1989: BREAST BIOPSY; Left     Comment:  benign [id number], 1998: CARDIAC CATHETERIZATION     Comment:  no stents, fatty streaks No date: CHOLECYSTECTOMY No date: COLON SURGERY 10/08/2018: COLONOSCOPY WITH PROPOFOL ; N/A     Comment:  Procedure: COLONOSCOPY WITH PROPOFOL ;  Surgeon:               Gaylyn Gladis PENNER, MD;  Location: [facility] ENDOSCOPY;                Service: Endoscopy;  Laterality: N/A; 1992: CYST REMOVAL HAND; Left     Comment:  Ganglion cyst removed from Left Wrist No date: CYSTECTOMY 03/04/2019: CYSTO WITH HYDRODISTENSION; N/A     Comment:  Procedure: CYSTOSCOPY/HYDRODISTENSION;  Surgeon:               Gaston Hamilton, MD;  Location: [facility] ORS;  Service:               Urology;  Laterality: N/A; No date: DILATATION & CURETTAGE/HYSTEROSCOPY WITH MYOSURE No date: DILATION AND CURETTAGE OF UTERUS No date: ERCP No date: ESOPHAGOGASTRODUODENOSCOPY No date: HERNIA REPAIR 1992: KNEE ARTHROSCOPY; Left 02/06/2019: LEFT HEART CATH AND CORONARY ANGIOGRAPHY; Left     Comment:  Procedure: LEFT HEART CATH AND CORONARY ANGIOGRAPHY;                Surgeon: Florencio Cara BIRCH, MD;  Location: [facility] INVASIVE              CV LAB;  Service: Cardiovascular;  Laterality: Left; No date: LIVER BIOPSY 2006: PANCREAS SURGERY     Comment:  partial pancreatectomy after endoscope knicked a part of              pancreas.  04/10/2017: PORTA CATH INSERTION; N/A     Comment:  Procedure: PORTA CATH INSERTION;  Surgeon: Marea Selinda RAMAN,              MD;   Location: [facility] INVASIVE CV LAB;  Service:               Cardiovascular;  Laterality: N/A; 02/21/2019: PORTA CATH INSERTION; N/A     Comment:  Procedure: PORTA CATH INSERTION;  Surgeon: Marea Selinda RAMAN,              MD;  Location: [facility] INVASIVE CV LAB;  Service:  Cardiovascular;  Laterality: N/A; 05/19/2022: PORTA CATH INSERTION; Right     Comment:  Procedure: PORTA CATH INSERTION;  Surgeon: Marea Selinda RAMAN,              MD;  Location: [facility] INVASIVE CV LAB;  Service:               Cardiovascular;  Laterality: Right; 2012: REPAIR OF ESOPHAGUS     Comment:  3 clamps placed in esophagus 03/15/2018: ROBOTIC ASSISTED LAPAROSCOPIC VENTRAL/INCISIONAL HERNIA  REPAIR; N/A     Comment:  Procedure: ROBOTIC ASSISTED LAPAROSCOPIC VENTRAL HERNIA               REPAIR;  Surgeon: Jordis Laneta FALCON, MD;  Location: [facility]               ORS;  Service: General;  Laterality: N/A; No date: SMALL BOWEL REPAIR No date: TONSILLECTOMY  BMI    Body Mass Index: 27.79 kg/m      Reproductive/Obstetrics negative OB ROS                              Anesthesia Physical Anesthesia Plan  ASA: 3  Anesthesia Plan: General   Post-op Pain Management:    Induction: Intravenous  PONV Risk Score and Plan: 4 or greater and Ondansetron , Dexamethasone , Midazolam  and Treatment may vary due to age or medical condition  Airway Management Planned: LMA  Additional Equipment:   Intra-op Plan:   Post-operative Plan: Extubation in OR  Informed Consent: I have reviewed the patients History and Physical, chart, labs and discussed the procedure including the risks, benefits and alternatives for the proposed anesthesia with the patient or authorized representative who has indicated his/her understanding and acceptance.     Dental Advisory Given  Plan Discussed with: CRNA and Surgeon  Anesthesia Plan Comments:          Anesthesia Quick Evaluation

## 2023-09-26 NOTE — Anesthesia Procedure Notes (Signed)
 Procedure Name: LMA Insertion Date/Time: 09/26/2023 7:40 AM  Performed by: Hunter Bachar R, CRNAPre-anesthesia Checklist: Patient identified, Emergency Drugs available, Suction available, Patient being monitored and Timeout performed Patient Re-evaluated:Patient Re-evaluated prior to induction Oxygen Delivery Method: Circle system utilized Preoxygenation: Pre-oxygenation with 100% oxygen Induction Type: IV induction LMA: LMA inserted LMA Size: 3.0 Number of attempts: 1 Placement Confirmation: positive ETCO2 and breath sounds checked- equal and bilateral Tube secured with: Tape Dental Injury: Teeth and Oropharynx as per pre-operative assessment

## 2023-09-27 ENCOUNTER — Encounter: Payer: Self-pay | Admitting: Orthopedic Surgery

## 2023-10-15 NOTE — Anesthesia Postprocedure Evaluation (Signed)
 Anesthesia Post Note  Patient: Amber Christensen  Procedure(s) Performed: ARTHROSCOPY, KNEE, WITH MEDIAL MENISCECTOMY (Right: Knee) SYNOVECTOMY (Right: Knee)  Patient location during evaluation: PACU Anesthesia Type: General Level of consciousness: awake and alert Pain management: pain level controlled Vital Signs Assessment: post-procedure vital signs reviewed and stable Respiratory status: spontaneous breathing, nonlabored ventilation, respiratory function stable and patient connected to nasal cannula oxygen Cardiovascular status: blood pressure returned to baseline and stable Postop Assessment: no apparent nausea or vomiting Anesthetic complications: no   There were no known notable events for this encounter.   Last Vitals:  Vitals:   09/26/23 0930 09/26/23 0951  BP: (!) 150/68 (!) 163/73  Pulse: 71 71  Resp: 14 20  Temp:  (!) 36.4 C  SpO2: 98% 99%    Last Pain:  Vitals:   09/27/23 0918  TempSrc:   PainSc: 2                  Prentice Murphy

## 2023-12-05 ENCOUNTER — Ambulatory Visit
Admission: RE | Admit: 2023-12-05 | Discharge: 2023-12-05 | Disposition: A | Discharge status: Home / Self Care | Source: Ambulatory Visit | Attending: Vascular Surgery | Admitting: Vascular Surgery

## 2023-12-05 ENCOUNTER — Telehealth (INDEPENDENT_AMBULATORY_CARE_PROVIDER_SITE_OTHER): Payer: Self-pay

## 2023-12-05 DIAGNOSIS — I872 Venous insufficiency (chronic) (peripheral): Secondary | ICD-10-CM | POA: Insufficient documentation

## 2023-12-05 MED ORDER — HEPARIN SOD (PORK) LOCK FLUSH 100 UNIT/ML IV SOLN
500.0000 [IU] | INTRAVENOUS | Status: AC | PRN
  Administered 2023-12-05: 500 [IU]

## 2023-12-05 NOTE — Telephone Encounter (Signed)
 Patient called concerned about her porta cath, stating they are having difficulty accessing and with clotting at this time, but she states they are able to flush the cath and this is the fourth time recently this has happened. She states she possibly needs more surgery soon and would like advice at this time.

## 2023-12-05 NOTE — Telephone Encounter (Signed)
 Called patient with follow-up advice, patient is now saying the flushes are start and stop and a very slow process. After the last time they pushed heparin  she is having pain in her shoulder and it hurts to turn her head to the right. She states the pain is a 7/10 at this time and something just isn't right. Please advise.

## 2023-12-05 NOTE — Progress Notes (Signed)
 Pt arrived this morning for porta cath flush. This RN accessed the port and noted blood return. Manipulation was needed to flush the port. Pt had head turned to the left, pt reclined, and right arm was raised to assist in flush. After flushing, the port was de accessed and gauze and Tegaderm was placed over the site.

## 2023-12-13 ENCOUNTER — Ambulatory Visit (INDEPENDENT_AMBULATORY_CARE_PROVIDER_SITE_OTHER): Admitting: Nurse Practitioner

## 2023-12-13 ENCOUNTER — Encounter (INDEPENDENT_AMBULATORY_CARE_PROVIDER_SITE_OTHER)

## 2023-12-13 ENCOUNTER — Encounter (INDEPENDENT_AMBULATORY_CARE_PROVIDER_SITE_OTHER): Payer: Self-pay | Admitting: Nurse Practitioner

## 2023-12-13 ENCOUNTER — Encounter (INDEPENDENT_AMBULATORY_CARE_PROVIDER_SITE_OTHER): Payer: Self-pay

## 2023-12-13 VITALS — BP 141/87 | HR 75 | Resp 18 | Wt 173.8 lb

## 2023-12-13 DIAGNOSIS — I878 Other specified disorders of veins: Secondary | ICD-10-CM | POA: Diagnosis not present

## 2023-12-13 NOTE — Progress Notes (Signed)
 Subjective:    Patient ID: Amber Christensen, female    DOB: [birth year], 58 y.o.   MRN: [id number] Chief Complaint  Patient presents with   Follow-up    Ref Hande consult difficult intravenous access    The patient presents today for evaluation of her Port-A-Cath.  She has a port due to having significantly difficult venous access.  She notes that recently she has been getting it flushed but has been difficult to flush and after flushing it becomes painful and somewhat swollen around the area.  She notes that recently it was able to drop back but she has not been able to have it dried back in a short time.  She denies any swelling of the upper extremity or any difference in sensation but again is swollen in the area and become painful and tight after flushing.  She denies any swelling of the right arm.  No development of any fever or chills.  Today no evidence of cellulitis is present at and around the area.  No crepitus felt.    Review of Systems  Musculoskeletal:  Positive for arthralgias and gait problem.  All other systems reviewed and are negative.      Objective:   Physical Exam Vitals reviewed.  HENT:     Head: Normocephalic.  Cardiovascular:     Rate and Rhythm: Normal rate.     Pulses: Normal pulses.  Pulmonary:     Effort: Pulmonary effort is normal.  Skin:    General: Skin is warm and dry.  Neurological:     Mental Status: She is alert and oriented to person, place, and time.     Gait: Gait abnormal.  Psychiatric:        Mood and Affect: Mood normal.        Behavior: Behavior normal.        Thought Content: Thought content normal.        Judgment: Judgment normal.     BP (!) 141/87   Pulse 75   Resp 18   Wt 173 lb 12.8 oz (78.8 kg)   BMI 29.83 kg/m   Past Medical History:  Diagnosis Date   Abdominal aortic atherosclerosis    Allergy    Anemia    Anginal pain 01/2018   cardiac workup clear   Anxiety    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HCC)    Bipolar  disorder (HCC)    Bladder mass    removed and had tbt. mesh removed   Blood transfusion without reported diagnosis 1999   received 20 units of blood after tearing esophagus from vomitting so severely after ERCP   Breast mass    left side biopsy was negative   Cardiac amyloidosis (HCC)    a.) questionable history; no evidence of infiltrative disease on cardiac MRI 10/2022   Carpal tunnel syndrome, left    Cervical cancer (HCC) 2002   Chronic pancreatitis s/p subtotal pancreatectomy    Cirrhosis (HCC)    Depression    Diabetes mellitus type 1, with complication, on long term insulin  pump (HCC) 05/2016   a.) following subtotal pancreatectomy   Difficult intravenous access    Diverticulosis    Dyspnea    Esophagitis    Essential hypertension    GERD (gastroesophageal reflux disease)    Gilbert's syndrome    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HCC)    doctors think this was due to plavix  and having a dental procedure which bled alot   Irritable bowel syndrome    Migraines  Mixed hyperlipidemia    MRSA infection    Multiple thyroid  nodules    NASH (nonalcoholic steatohepatitis)    Necrotizing pancreatitis    Pancreatitis    Peripheral neuropathy    Peripheral tear of medial meniscus of right knee as current injury    PONV (postoperative nausea and vomiting)    Renal cyst, left    Stroke (HCC)    weakness on the left   TIA (transient ischemic attack) 11/2017    Social History   Socioeconomic History   Marital status: Married    Spouse name: donald   Number of children: 3   Years of education: Not on file   Highest education level: Not on file  Occupational History    Comment: disabled  Tobacco Use   Smoking status: Never   Smokeless tobacco: Never  Vaping Use   Vaping status: Never Used  Substance and Sexual Activity   Alcohol use: Not Currently    Comment: rarely   Drug use: No   Sexual activity: Not Currently    Birth control/protection: Surgical  Other Topics Concern   Not on  file  Social History Narrative   Independent, at baseline   Social Drivers of Health   Financial Resource Strain: Low Risk  (11/07/2023)   Received from [facility]   Overall Financial Resource Strain (CARDIA)    Difficulty of Paying Living Expenses: Not hard at all  Recent Concern: Financial Resource Strain - Medium Risk (10/02/2023)   Received from [facility]   Overall Financial Resource Strain (CARDIA)    Difficulty of Paying Living Expenses: Somewhat hard  Food Insecurity: No Food Insecurity (11/07/2023)   Received from [facility]   Hunger Vital Sign    Within the past 12 months, you worried that your food would run out before you got the money to buy more.: Never true    Within the past 12 months, the food you bought just didn't last and you didn't have money to get more.: Never true  Recent Concern: Food Insecurity - Food Insecurity Present (10/02/2023)   Received from [facility]   Hunger Vital Sign    Within the past 12 months, you worried that your food would run out before you got the money to buy more.: Sometimes true    Within the past 12 months, the food you bought just didn't last and you didn't have money to get more.: Sometimes true  Transportation Needs: Unmet Transportation Needs (11/07/2023)   Received from [facility] - Transportation    In the past 12 months, has lack of transportation kept you from medical appointments or from getting medications?: No    Lack of Transportation (Non-Medical): Yes  Physical Activity: Unknown (05/11/2018)   Received from [facility]   Exercise Vital Sign    On average, how many days per week do you engage in moderate to strenuous exercise (like a brisk walk)?: 0 days    Minutes of Exercise per Session: Not on file  Stress: Stress Concern Present (09/27/2018)   [employer] [facility] [employer] - Occupational  Stress Questionnaire    Feeling of Stress : To some extent  Social Connections: Unknown (05/28/2018)   Received from [facility]   Social Connection and Isolation Panel    Frequency of Communication with Friends and Family: Not on file    Frequency of Social Gatherings with Friends and  Family: Not on file    How often do you attend church or religious services?: Never    Do you belong to any clubs or organizations such as church groups, unions, fraternal or athletic groups, or school groups?: No    How often do you attend meetings of the clubs or organizations you belong to?: Never    Are you married, widowed, divorced, separated, never married, or living with a partner?: Married  Intimate Partner Violence: Not At Risk (12/06/2018)   Humiliation, Afraid, Rape, and Kick questionnaire    Fear of Current or Ex-Partner: No    Emotionally Abused: No    Physically Abused: No    Sexually Abused: No    Past Surgical History:  Procedure Laterality Date   ABDOMINAL HYSTERECTOMY     APPENDECTOMY     BLADDER SURGERY     mesh removed. revision which has caused everything to fall again   BOWEL RESECTION     took a piece of bowel out after pancreatectomy caused problem with bowel.    BREAST BIOPSY Left 1989   benign   CARDIAC CATHETERIZATION  8009,8004, 1998   no stents, fatty streaks   CHOLECYSTECTOMY     COLON SURGERY     COLONOSCOPY WITH PROPOFOL  N/A 10/08/2018   Procedure: COLONOSCOPY WITH PROPOFOL ;  Surgeon: Gaylyn Gladis PENNER, MD;  Location: [facility] ENDOSCOPY;  Service: Endoscopy;  Laterality: N/A;   COLONOSCOPY WITH PROPOFOL  N/A 10/18/2022   Procedure: COLONOSCOPY WITH PROPOFOL ;  Surgeon: Maryruth Ole DASEN, MD;  Location: [facility] ENDOSCOPY;  Service: Endoscopy;  Laterality: N/A;   CYST REMOVAL HAND Left 1992   Ganglion cyst removed from Left Wrist   CYSTECTOMY     CYSTO WITH HYDRODISTENSION N/A 03/04/2019   Procedure: CYSTOSCOPY/HYDRODISTENSION;  Surgeon: Gaston Hamilton, MD;  Location: [facility] ORS;  Service: Urology;  Laterality: N/A;   DILATATION & CURETTAGE/HYSTEROSCOPY WITH MYOSURE     DILATION AND CURETTAGE OF UTERUS     ERCP  2014   ESOPHAGOGASTRODUODENOSCOPY  09/25/2013   ESOPHAGOGASTRODUODENOSCOPY (EGD) WITH PROPOFOL  N/A 10/18/2022   Procedure: ESOPHAGOGASTRODUODENOSCOPY (EGD) WITH PROPOFOL ;  Surgeon: Maryruth Ole DASEN, MD;  Location: [facility] ENDOSCOPY;  Service: Endoscopy;  Laterality: N/A;   HERNIA REPAIR     KNEE ARTHROSCOPY Left 1992   KNEE ARTHROSCOPY WITH MEDIAL MENISECTOMY Right 09/26/2023   Procedure: ARTHROSCOPY, KNEE, WITH MEDIAL MENISCECTOMY;  Surgeon: Tobie Priest, MD;  Location: [facility] ORS;  Service: Orthopedics;  Laterality: Right;  Right knee arthroscopic partial medial meniscectomy and partial synovectomy with parameniscal cyst excision   LEFT HEART CATH AND CORONARY ANGIOGRAPHY Left 02/06/2019   Procedure: LEFT HEART CATH AND CORONARY ANGIOGRAPHY;  Surgeon: Florencio Cara BIRCH, MD;  Location: [facility] INVASIVE CV LAB;  Service: Cardiovascular;  Laterality: Left;   LIVER BIOPSY     PANCREAS SURGERY  2006   partial pancreatectomy after endoscope knicked a part of pancreas.    PORTA CATH INSERTION N/A 04/10/2017   Procedure: PORTA CATH INSERTION;  Surgeon: Marea Selinda RAMAN, MD;  Location: [facility] INVASIVE CV LAB;  Service: Cardiovascular;  Laterality: N/A;   PORTA CATH INSERTION N/A 02/21/2019   Procedure: PORTA CATH INSERTION;  Surgeon: Marea Selinda RAMAN, MD;  Location: [facility] INVASIVE CV LAB;  Service: Cardiovascular;  Laterality: N/A;   PORTA CATH INSERTION Right 05/19/2022   Procedure: PORTA CATH INSERTION;  Surgeon: Marea Selinda RAMAN, MD;  Location: [facility] INVASIVE CV LAB;  Service: Cardiovascular;  Laterality: Right;   REPAIR OF ESOPHAGUS  2012   3 clamps placed  in esophagus   ROBOTIC ASSISTED LAPAROSCOPIC VENTRAL/INCISIONAL HERNIA REPAIR N/A 03/15/2018   Procedure: ROBOTIC ASSISTED LAPAROSCOPIC VENTRAL HERNIA REPAIR;  Surgeon: Jordis Laneta FALCON, MD;   Location: [facility] ORS;  Service: General;  Laterality: N/A;   SMALL BOWEL REPAIR     SYNOVECTOMY Right 09/26/2023   Procedure: SYNOVECTOMY;  Surgeon: Tobie Priest, MD;  Location: [facility] ORS;  Service: Orthopedics;  Laterality: Right;  Right knee arthroscopic partial medial meniscectomy and partial synovectomy with parameniscal cyst excision   TONSILLECTOMY      Family History  Problem Relation Age of Onset   Cirrhosis Mother    Colon cancer Mother 74       TAH/BSO in ~22; deceased at 31   Hypertension Mother    Breast cancer Mother 3       unconfirmed   Hypertension Father    Prostate cancer Father 16       currently 61   Other Father        liver disease   Breast cancer Sister    Non-Hodgkin's lymphoma Daughter        unconfirmed; currently 27   Breast cancer Maternal Aunt        age at dx unk.; currently 69   Cervical cancer Maternal Aunt    Cancer Maternal Aunt        hematologic malignancy; deceased 35   Cancer Maternal Uncle        unk. type; currently 80s   Stomach cancer Paternal Aunt 5       deceased at 78   Breast cancer Maternal Grandmother 48       possibly bilateral in 31s; deceased 81    Allergies  Allergen Reactions   Ciprofloxacin      Rash, nausea vomitting   Demerol [Meperidine] Hives and Other (See Comments)    Pt states that this medication causes cardiac arrest.     Dilaudid  [Hydromorphone  Hcl] Nausea And Vomiting and Other (See Comments)    Pt states that this medication causes cardiac arrest.     Metoclopramide  Hives    Breathing issues   Morphine And Codeine Anaphylaxis and Other (See Comments)    Pt states that this medication causes cardiac arrest   Morpholine Salicylate Nausea And Vomiting, Other (See Comments) and Rash    CHF   Atorvastatin  Dermatitis and Nausea Only   Isosorbide Nitrate Other (See Comments)    Headache    Sulfa Antibiotics Nausea And Vomiting   Amoxicillin     Flagyl  [Metronidazole ]     Rash, heart racing, nausea  vomitting   Adhesive [Tape] Rash    Blisters skin Paper tape is okay   Darvon [Propoxyphene] Nausea And Vomiting and Rash   Flexeril [Cyclobenzaprine] Nausea And Vomiting and Rash   Levaquin [Levofloxacin In D5w] Nausea And Vomiting and Rash   Naproxen  Rash    Ibuprofen  and advil  are not a problem   Soma [Carisoprodol] Nausea And Vomiting and Rash   Topiramate Nausea And Vomiting and Other (See Comments)    dizziness   Zofran  [Ondansetron  Hcl] Nausea And Vomiting and Rash       Latest Ref Rng & Units 09/26/2023    7:15 AM 08/25/2023   11:51 AM 11/10/2020    2:11 PM  CBC  WBC 4.0 - 10.5 K/uL  11.2  10.4   Hemoglobin 12.0 - 15.0 g/dL 88.3  86.6  86.6   Hematocrit 36.0 - 46.0 % 34.0  37.4  38.4   Platelets 150 - 400  K/uL  241  197       CMP     Component Value Date/Time   NA 141 09/26/2023 0715   NA 138 01/31/2014 1356   K 3.6 09/26/2023 0715   K 2.8 (L) 01/31/2014 1356   CL 103 09/26/2023 0715   CL 102 01/31/2014 1356   CO2 25 08/25/2023 1151   CO2 25 01/31/2014 1356   GLUCOSE 118 (H) 09/26/2023 0715   GLUCOSE 276 (H) 01/31/2014 1356   BUN 13 09/26/2023 0715   BUN 10 01/31/2014 1356   CREATININE 0.90 09/26/2023 0715   CREATININE 0.72 01/31/2014 1356   CALCIUM  8.8 (L) 08/25/2023 1151   CALCIUM  8.8 06/12/2017 0930   PROT 6.0 (L) 08/15/2020 0150   PROT 6.9 01/31/2014 1356   ALBUMIN 3.2 (L) 08/15/2020 0150   ALBUMIN 4.0 01/31/2014 1356   AST 25 08/15/2020 0150   AST 32 01/31/2014 1356   ALT 35 08/15/2020 0150   ALT 58 01/31/2014 1356   ALKPHOS 117 08/15/2020 0150   ALKPHOS 151 (H) 01/31/2014 1356   BILITOT 2.9 (H) 08/15/2020 0150   BILITOT 7.5 (H) 01/31/2014 1356   GFRNONAA >60 08/25/2023 1151   GFRNONAA >60 01/31/2014 1356   GFRNONAA >60 09/25/2013 0401     No results found.     Assessment & Plan:   1. Poor venous access (Primary) Based upon the patient's description and her symptoms she should have a venogram done to evaluate her port as well as a  possible port revision depending on if there is blocking areas or if it is malfunctioning.  We have discussed the risk benefits alternatives indications were agreeable to proceed.   Current Outpatient Medications on File Prior to Visit  Medication Sig Dispense Refill   amLODipine  (NORVASC ) 2.5 MG tablet Take 2.5 mg by mouth in the morning.     Calcium  Carb-Cholecalciferol  (CALCIUM  600/VITAMIN D3 PO) Take 1 tablet by mouth in the morning.     Cholecalciferol  (VITAMIN D -3) 5000 units TABS Take 5,000 Units by mouth in the morning.     Continuous Blood Gluc Receiver (DEXCOM G6 RECEIVER) DEVI Use to monitor blood sugar. NDC [phone]     cyanocobalamin  (VITAMIN B12) 1000 MCG tablet Take 1,000 mcg by mouth in the morning.     gabapentin  (NEURONTIN ) 300 MG capsule Take 300 mg by mouth 2 (two) times daily as needed (pain.).     Insulin  Human (INSULIN  PUMP) SOLN See admin instructions. insulin  lispro 100 unit/mL solution (up to 70 units per day)     insulin  lispro (HUMALOG ) 100 UNIT/ML injection SMARTSIG:0-70 Unit(s) SUB-Q Daily     Lactobacillus (ACIDOPHILUS PO) Take 1 capsule by mouth in the morning.     LANTUS  100 UNIT/ML injection Inject into the skin.     linaclotide (LINZESS) 145 MCG CAPS capsule Take 145 mcg by mouth daily before breakfast.     lipase/protease/amylase (CREON ) 36000 UNITS CPEP capsule Take 144,000 Units by mouth in the morning, at noon, in the evening, and at bedtime.     losartan (COZAAR) 100 MG tablet Take 50 mg by mouth in the morning.     Magnesium  250 MG TABS Take 250 mg by mouth in the morning.     metoprolol  succinate (TOPROL -XL) 25 MG 24 hr tablet Take 25 mg by mouth in the morning.     pantoprazole  (PROTONIX ) 40 MG tablet Take 40 mg by mouth in the morning.     potassium chloride  SA (KLOR-CON  M) 10 MEQ tablet  Take 2 tablets (20 mEq) today, then take 1 tablet (10 mEq) daily until surgery. Be sure to take dose on day of surgery. Follow up with PCP for repeat labs. 6  tablet 0   vitamin E  180 MG (400 UNITS) capsule Take 400 Units by mouth in the morning.     acetaminophen  (TYLENOL ) 500 MG tablet Take 2 tablets (1,000 mg total) by mouth every 8 (eight) hours. 90 tablet 2   clobetasol  ointment (TEMOVATE ) 0.05 % Apply to affected area every night for 4 weeks, then every other day for 4 weeks and then twice a week for 4 weeks or until resolution. 30 g 5   cyanocobalamin  (VITAMIN B12) 1000 MCG/ML injection Inject 1,000 mcg into the muscle every 30 (thirty) days.     ondansetron  (ZOFRAN -ODT) 4 MG disintegrating tablet Take 1 tablet (4 mg total) by mouth every 8 (eight) hours as needed for nausea or vomiting. 20 tablet 0   oxyCODONE  (OXY IR/ROXICODONE ) 5 MG immediate release tablet Take 5 mg by mouth daily as needed (severe pain.).     oxyCODONE  (ROXICODONE ) 5 MG immediate release tablet Take 1-2 tablets (5-10 mg total) by mouth every 4 (four) hours as needed (pain). 20 tablet 0   oxyCODONE  (ROXICODONE ) 5 MG immediate release tablet Take 1-2 tablets (5-10 mg total) by mouth every 4 (four) hours as needed (pain). 30 tablet 0   Wheat Dextrin-Calcium  (BENEFIBER PLUS CALCIUM ) CHEW Chew 1-2 tablets by mouth in the morning.     No current facility-administered medications on file prior to visit.    There are no Patient Instructions on file for this visit. No follow-ups on file.   Rodert Hinch E Zelda Reames, NP

## 2023-12-13 NOTE — H&P (View-Only) (Signed)
 Subjective:    Patient ID: Amber Christensen, female    DOB: [birth year], 58 y.o.   MRN: [id number] Chief Complaint  Patient presents with   Follow-up    Ref Hande consult difficult intravenous access    The patient presents today for evaluation of her Port-A-Cath.  She has a port due to having significantly difficult venous access.  She notes that recently she has been getting it flushed but has been difficult to flush and after flushing it becomes painful and somewhat swollen around the area.  She notes that recently it was able to drop back but she has not been able to have it dried back in a short time.  She denies any swelling of the upper extremity or any difference in sensation but again is swollen in the area and become painful and tight after flushing.  She denies any swelling of the right arm.  No development of any fever or chills.  Today no evidence of cellulitis is present at and around the area.  No crepitus felt.    Review of Systems  Musculoskeletal:  Positive for arthralgias and gait problem.  All other systems reviewed and are negative.      Objective:   Physical Exam Vitals reviewed.  HENT:     Head: Normocephalic.  Cardiovascular:     Rate and Rhythm: Normal rate.     Pulses: Normal pulses.  Pulmonary:     Effort: Pulmonary effort is normal.  Skin:    General: Skin is warm and dry.  Neurological:     Mental Status: She is alert and oriented to person, place, and time.     Gait: Gait abnormal.  Psychiatric:        Mood and Affect: Mood normal.        Behavior: Behavior normal.        Thought Content: Thought content normal.        Judgment: Judgment normal.     BP (!) 141/87   Pulse 75   Resp 18   Wt 173 lb 12.8 oz (78.8 kg)   BMI 29.83 kg/m   Past Medical History:  Diagnosis Date   Abdominal aortic atherosclerosis    Allergy    Anemia    Anginal pain 01/2018   cardiac workup clear   Anxiety    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HCC)    Bipolar  disorder (HCC)    Bladder mass    removed and had tbt. mesh removed   Blood transfusion without reported diagnosis 1999   received 20 units of blood after tearing esophagus from vomitting so severely after ERCP   Breast mass    left side biopsy was negative   Cardiac amyloidosis (HCC)    a.) questionable history; no evidence of infiltrative disease on cardiac MRI 10/2022   Carpal tunnel syndrome, left    Cervical cancer (HCC) 2002   Chronic pancreatitis s/p subtotal pancreatectomy    Cirrhosis (HCC)    Depression    Diabetes mellitus type 1, with complication, on long term insulin  pump (HCC) 05/2016   a.) following subtotal pancreatectomy   Difficult intravenous access    Diverticulosis    Dyspnea    Esophagitis    Essential hypertension    GERD (gastroesophageal reflux disease)    Gilbert's syndrome    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HCC)    doctors think this was due to plavix  and having a dental procedure which bled alot   Irritable bowel syndrome    Migraines  Mixed hyperlipidemia    MRSA infection    Multiple thyroid  nodules    NASH (nonalcoholic steatohepatitis)    Necrotizing pancreatitis    Pancreatitis    Peripheral neuropathy    Peripheral tear of medial meniscus of right knee as current injury    PONV (postoperative nausea and vomiting)    Renal cyst, left    Stroke (HCC)    weakness on the left   TIA (transient ischemic attack) 11/2017    Social History   Socioeconomic History   Marital status: Married    Spouse name: donald   Number of children: 3   Years of education: Not on file   Highest education level: Not on file  Occupational History    Comment: disabled  Tobacco Use   Smoking status: Never   Smokeless tobacco: Never  Vaping Use   Vaping status: Never Used  Substance and Sexual Activity   Alcohol use: Not Currently    Comment: rarely   Drug use: No   Sexual activity: Not Currently    Birth control/protection: Surgical  Other Topics Concern   Not on  file  Social History Narrative   Independent, at baseline   Social Drivers of Health   Financial Resource Strain: Low Risk  (11/07/2023)   Received from [facility]   Overall Financial Resource Strain (CARDIA)    Difficulty of Paying Living Expenses: Not hard at all  Recent Concern: Financial Resource Strain - Medium Risk (10/02/2023)   Received from [facility]   Overall Financial Resource Strain (CARDIA)    Difficulty of Paying Living Expenses: Somewhat hard  Food Insecurity: No Food Insecurity (11/07/2023)   Received from [facility]   Hunger Vital Sign    Within the past 12 months, you worried that your food would run out before you got the money to buy more.: Never true    Within the past 12 months, the food you bought just didn't last and you didn't have money to get more.: Never true  Recent Concern: Food Insecurity - Food Insecurity Present (10/02/2023)   Received from [facility]   Hunger Vital Sign    Within the past 12 months, you worried that your food would run out before you got the money to buy more.: Sometimes true    Within the past 12 months, the food you bought just didn't last and you didn't have money to get more.: Sometimes true  Transportation Needs: Unmet Transportation Needs (11/07/2023)   Received from [facility] - Transportation    In the past 12 months, has lack of transportation kept you from medical appointments or from getting medications?: No    Lack of Transportation (Non-Medical): Yes  Physical Activity: Unknown (05/11/2018)   Received from [facility]   Exercise Vital Sign    On average, how many days per week do you engage in moderate to strenuous exercise (like a brisk walk)?: 0 days    Minutes of Exercise per Session: Not on file  Stress: Stress Concern Present (09/27/2018)   [employer] [facility] [employer] - Occupational  Stress Questionnaire    Feeling of Stress : To some extent  Social Connections: Unknown (05/28/2018)   Received from [facility]   Social Connection and Isolation Panel    Frequency of Communication with Friends and Family: Not on file    Frequency of Social Gatherings with Friends and  Family: Not on file    How often do you attend church or religious services?: Never    Do you belong to any clubs or organizations such as church groups, unions, fraternal or athletic groups, or school groups?: No    How often do you attend meetings of the clubs or organizations you belong to?: Never    Are you married, widowed, divorced, separated, never married, or living with a partner?: Married  Intimate Partner Violence: Not At Risk (12/06/2018)   Humiliation, Afraid, Rape, and Kick questionnaire    Fear of Current or Ex-Partner: No    Emotionally Abused: No    Physically Abused: No    Sexually Abused: No    Past Surgical History:  Procedure Laterality Date   ABDOMINAL HYSTERECTOMY     APPENDECTOMY     BLADDER SURGERY     mesh removed. revision which has caused everything to fall again   BOWEL RESECTION     took a piece of bowel out after pancreatectomy caused problem with bowel.    BREAST BIOPSY Left 1989   benign   CARDIAC CATHETERIZATION  8009,8004, 1998   no stents, fatty streaks   CHOLECYSTECTOMY     COLON SURGERY     COLONOSCOPY WITH PROPOFOL  N/A 10/08/2018   Procedure: COLONOSCOPY WITH PROPOFOL ;  Surgeon: Gaylyn Gladis PENNER, MD;  Location: [facility] ENDOSCOPY;  Service: Endoscopy;  Laterality: N/A;   COLONOSCOPY WITH PROPOFOL  N/A 10/18/2022   Procedure: COLONOSCOPY WITH PROPOFOL ;  Surgeon: Maryruth Ole DASEN, MD;  Location: [facility] ENDOSCOPY;  Service: Endoscopy;  Laterality: N/A;   CYST REMOVAL HAND Left 1992   Ganglion cyst removed from Left Wrist   CYSTECTOMY     CYSTO WITH HYDRODISTENSION N/A 03/04/2019   Procedure: CYSTOSCOPY/HYDRODISTENSION;  Surgeon: Gaston Hamilton, MD;  Location: [facility] ORS;  Service: Urology;  Laterality: N/A;   DILATATION & CURETTAGE/HYSTEROSCOPY WITH MYOSURE     DILATION AND CURETTAGE OF UTERUS     ERCP  2014   ESOPHAGOGASTRODUODENOSCOPY  09/25/2013   ESOPHAGOGASTRODUODENOSCOPY (EGD) WITH PROPOFOL  N/A 10/18/2022   Procedure: ESOPHAGOGASTRODUODENOSCOPY (EGD) WITH PROPOFOL ;  Surgeon: Maryruth Ole DASEN, MD;  Location: [facility] ENDOSCOPY;  Service: Endoscopy;  Laterality: N/A;   HERNIA REPAIR     KNEE ARTHROSCOPY Left 1992   KNEE ARTHROSCOPY WITH MEDIAL MENISECTOMY Right 09/26/2023   Procedure: ARTHROSCOPY, KNEE, WITH MEDIAL MENISCECTOMY;  Surgeon: Tobie Priest, MD;  Location: [facility] ORS;  Service: Orthopedics;  Laterality: Right;  Right knee arthroscopic partial medial meniscectomy and partial synovectomy with parameniscal cyst excision   LEFT HEART CATH AND CORONARY ANGIOGRAPHY Left 02/06/2019   Procedure: LEFT HEART CATH AND CORONARY ANGIOGRAPHY;  Surgeon: Florencio Cara BIRCH, MD;  Location: [facility] INVASIVE CV LAB;  Service: Cardiovascular;  Laterality: Left;   LIVER BIOPSY     PANCREAS SURGERY  2006   partial pancreatectomy after endoscope knicked a part of pancreas.    PORTA CATH INSERTION N/A 04/10/2017   Procedure: PORTA CATH INSERTION;  Surgeon: Marea Selinda RAMAN, MD;  Location: [facility] INVASIVE CV LAB;  Service: Cardiovascular;  Laterality: N/A;   PORTA CATH INSERTION N/A 02/21/2019   Procedure: PORTA CATH INSERTION;  Surgeon: Marea Selinda RAMAN, MD;  Location: [facility] CV LAB;  Service: Cardiovascular;  Laterality: N/A;   PORTA CATH INSERTION Right 05/19/2022   Procedure: PORTA CATH INSERTION;  Surgeon: Marea Selinda RAMAN, MD;  Location: [facility]VASIVE CV LAB;  Service: Cardiovascular;  Laterality: Right;   REPAIR OF ESOPHAGUS  2012   3 clamps placed  in esophagus   ROBOTIC ASSISTED LAPAROSCOPIC VENTRAL/INCISIONAL HERNIA REPAIR N/A 03/15/2018   Procedure: ROBOTIC ASSISTED LAPAROSCOPIC VENTRAL HERNIA REPAIR;  Surgeon: Jordis Laneta FALCON, MD;   Location: [facility] ORS;  Service: General;  Laterality: N/A;   SMALL BOWEL REPAIR     SYNOVECTOMY Right 09/26/2023   Procedure: SYNOVECTOMY;  Surgeon: Tobie Priest, MD;  Location: [facility] ORS;  Service: Orthopedics;  Laterality: Right;  Right knee arthroscopic partial medial meniscectomy and partial synovectomy with parameniscal cyst excision   TONSILLECTOMY      Family History  Problem Relation Age of Onset   Cirrhosis Mother    Colon cancer Mother 74       TAH/BSO in ~22; deceased at 31   Hypertension Mother    Breast cancer Mother 3       unconfirmed   Hypertension Father    Prostate cancer Father 16       currently 61   Other Father        liver disease   Breast cancer Sister    Non-Hodgkin's lymphoma Daughter        unconfirmed; currently 27   Breast cancer Maternal Aunt        age at dx unk.; currently 69   Cervical cancer Maternal Aunt    Cancer Maternal Aunt        hematologic malignancy; deceased 35   Cancer Maternal Uncle        unk. type; currently 80s   Stomach cancer Paternal Aunt 5       deceased at 78   Breast cancer Maternal Grandmother 48       possibly bilateral in 31s; deceased 81    Allergies  Allergen Reactions   Ciprofloxacin      Rash, nausea vomitting   Demerol [Meperidine] Hives and Other (See Comments)    Pt states that this medication causes cardiac arrest.     Dilaudid  [Hydromorphone  Hcl] Nausea And Vomiting and Other (See Comments)    Pt states that this medication causes cardiac arrest.     Metoclopramide  Hives    Breathing issues   Morphine And Codeine Anaphylaxis and Other (See Comments)    Pt states that this medication causes cardiac arrest   Morpholine Salicylate Nausea And Vomiting, Other (See Comments) and Rash    CHF   Atorvastatin  Dermatitis and Nausea Only   Isosorbide Nitrate Other (See Comments)    Headache    Sulfa Antibiotics Nausea And Vomiting   Amoxicillin     Flagyl  [Metronidazole ]     Rash, heart racing, nausea  vomitting   Adhesive [Tape] Rash    Blisters skin Paper tape is okay   Darvon [Propoxyphene] Nausea And Vomiting and Rash   Flexeril [Cyclobenzaprine] Nausea And Vomiting and Rash   Levaquin [Levofloxacin In D5w] Nausea And Vomiting and Rash   Naproxen  Rash    Ibuprofen  and advil  are not a problem   Soma [Carisoprodol] Nausea And Vomiting and Rash   Topiramate Nausea And Vomiting and Other (See Comments)    dizziness   Zofran  [Ondansetron  Hcl] Nausea And Vomiting and Rash       Latest Ref Rng & Units 09/26/2023    7:15 AM 08/25/2023   11:51 AM 11/10/2020    2:11 PM  CBC  WBC 4.0 - 10.5 K/uL  11.2  10.4   Hemoglobin 12.0 - 15.0 g/dL 88.3  86.6  86.6   Hematocrit 36.0 - 46.0 % 34.0  37.4  38.4   Platelets 150 - 400  K/uL  241  197       CMP     Component Value Date/Time   NA 141 09/26/2023 0715   NA 138 01/31/2014 1356   K 3.6 09/26/2023 0715   K 2.8 (L) 01/31/2014 1356   CL 103 09/26/2023 0715   CL 102 01/31/2014 1356   CO2 25 08/25/2023 1151   CO2 25 01/31/2014 1356   GLUCOSE 118 (H) 09/26/2023 0715   GLUCOSE 276 (H) 01/31/2014 1356   BUN 13 09/26/2023 0715   BUN 10 01/31/2014 1356   CREATININE 0.90 09/26/2023 0715   CREATININE 0.72 01/31/2014 1356   CALCIUM  8.8 (L) 08/25/2023 1151   CALCIUM  8.8 06/12/2017 0930   PROT 6.0 (L) 08/15/2020 0150   PROT 6.9 01/31/2014 1356   ALBUMIN 3.2 (L) 08/15/2020 0150   ALBUMIN 4.0 01/31/2014 1356   AST 25 08/15/2020 0150   AST 32 01/31/2014 1356   ALT 35 08/15/2020 0150   ALT 58 01/31/2014 1356   ALKPHOS 117 08/15/2020 0150   ALKPHOS 151 (H) 01/31/2014 1356   BILITOT 2.9 (H) 08/15/2020 0150   BILITOT 7.5 (H) 01/31/2014 1356   GFRNONAA >60 08/25/2023 1151   GFRNONAA >60 01/31/2014 1356   GFRNONAA >60 09/25/2013 0401     No results found.     Assessment & Plan:   1. Poor venous access (Primary) Based upon the patient's description and her symptoms she should have a venogram done to evaluate her port as well as a  possible port revision depending on if there is blocking areas or if it is malfunctioning.  We have discussed the risk benefits alternatives indications were agreeable to proceed.   Current Outpatient Medications on File Prior to Visit  Medication Sig Dispense Refill   amLODipine  (NORVASC ) 2.5 MG tablet Take 2.5 mg by mouth in the morning.     Calcium  Carb-Cholecalciferol  (CALCIUM  600/VITAMIN D3 PO) Take 1 tablet by mouth in the morning.     Cholecalciferol  (VITAMIN D -3) 5000 units TABS Take 5,000 Units by mouth in the morning.     Continuous Blood Gluc Receiver (DEXCOM G6 RECEIVER) DEVI Use to monitor blood sugar. NDC [phone]     cyanocobalamin  (VITAMIN B12) 1000 MCG tablet Take 1,000 mcg by mouth in the morning.     gabapentin  (NEURONTIN ) 300 MG capsule Take 300 mg by mouth 2 (two) times daily as needed (pain.).     Insulin  Human (INSULIN  PUMP) SOLN See admin instructions. insulin  lispro 100 unit/mL solution (up to 70 units per day)     insulin  lispro (HUMALOG ) 100 UNIT/ML injection SMARTSIG:0-70 Unit(s) SUB-Q Daily     Lactobacillus (ACIDOPHILUS PO) Take 1 capsule by mouth in the morning.     LANTUS  100 UNIT/ML injection Inject into the skin.     linaclotide (LINZESS) 145 MCG CAPS capsule Take 145 mcg by mouth daily before breakfast.     lipase/protease/amylase (CREON ) 36000 UNITS CPEP capsule Take 144,000 Units by mouth in the morning, at noon, in the evening, and at bedtime.     losartan (COZAAR) 100 MG tablet Take 50 mg by mouth in the morning.     Magnesium  250 MG TABS Take 250 mg by mouth in the morning.     metoprolol  succinate (TOPROL -XL) 25 MG 24 hr tablet Take 25 mg by mouth in the morning.     pantoprazole  (PROTONIX ) 40 MG tablet Take 40 mg by mouth in the morning.     potassium chloride  SA (KLOR-CON  M) 10 MEQ tablet  Take 2 tablets (20 mEq) today, then take 1 tablet (10 mEq) daily until surgery. Be sure to take dose on day of surgery. Follow up with PCP for repeat labs. 6  tablet 0   vitamin E  180 MG (400 UNITS) capsule Take 400 Units by mouth in the morning.     acetaminophen  (TYLENOL ) 500 MG tablet Take 2 tablets (1,000 mg total) by mouth every 8 (eight) hours. 90 tablet 2   clobetasol  ointment (TEMOVATE ) 0.05 % Apply to affected area every night for 4 weeks, then every other day for 4 weeks and then twice a week for 4 weeks or until resolution. 30 g 5   cyanocobalamin  (VITAMIN B12) 1000 MCG/ML injection Inject 1,000 mcg into the muscle every 30 (thirty) days.     ondansetron  (ZOFRAN -ODT) 4 MG disintegrating tablet Take 1 tablet (4 mg total) by mouth every 8 (eight) hours as needed for nausea or vomiting. 20 tablet 0   oxyCODONE  (OXY IR/ROXICODONE ) 5 MG immediate release tablet Take 5 mg by mouth daily as needed (severe pain.).     oxyCODONE  (ROXICODONE ) 5 MG immediate release tablet Take 1-2 tablets (5-10 mg total) by mouth every 4 (four) hours as needed (pain). 20 tablet 0   oxyCODONE  (ROXICODONE ) 5 MG immediate release tablet Take 1-2 tablets (5-10 mg total) by mouth every 4 (four) hours as needed (pain). 30 tablet 0   Wheat Dextrin-Calcium  (BENEFIBER PLUS CALCIUM ) CHEW Chew 1-2 tablets by mouth in the morning.     No current facility-administered medications on file prior to visit.    There are no Patient Instructions on file for this visit. No follow-ups on file.   Rodert Hinch E Zelda Reames, NP

## 2023-12-14 ENCOUNTER — Telehealth (INDEPENDENT_AMBULATORY_CARE_PROVIDER_SITE_OTHER): Payer: Self-pay

## 2023-12-14 NOTE — Telephone Encounter (Signed)
 Patient left message at this time confused as to why a doppler study was showing up in her mychart for yesterday when she had an appointment with Orvin Daring, NP. She also stated she was supposed to have the dye testing on her port and was uncertain what the mychart notifcation was in reference to.  I spoke to [place], NP and she stated she explained to patient yesterday in office visit why her doppler ultrasound was cancelled and that Leita would be calling her to schedule her dye testing. I called patient at this time and explained to her she verbalized understanding at this time.

## 2023-12-18 ENCOUNTER — Telehealth (INDEPENDENT_AMBULATORY_CARE_PROVIDER_SITE_OTHER): Payer: Self-pay

## 2023-12-19 ENCOUNTER — Telehealth (INDEPENDENT_AMBULATORY_CARE_PROVIDER_SITE_OTHER): Payer: Self-pay

## 2023-12-19 NOTE — Telephone Encounter (Signed)
 Spoke with the patient and she is scheduled with Dr. Marea for a right arm venogram and possible port revision/exchange, on 12/25/23 with a 6:45 am arrival time to the [facility]. Pre-procedure instructions were discussed and will be sent to Mychart and mailed.

## 2023-12-20 NOTE — Telephone Encounter (Signed)
 PATIENT SCHEDULED

## 2023-12-25 ENCOUNTER — Other Ambulatory Visit: Payer: Self-pay

## 2023-12-25 ENCOUNTER — Encounter: Payer: Self-pay | Admitting: Vascular Surgery

## 2023-12-25 ENCOUNTER — Ambulatory Visit
Admission: RE | Admit: 2023-12-25 | Discharge: 2023-12-25 | Disposition: A | Discharge status: Home / Self Care | Attending: Vascular Surgery | Admitting: Vascular Surgery

## 2023-12-25 ENCOUNTER — Other Ambulatory Visit: Payer: Self-pay | Admitting: Orthopedic Surgery

## 2023-12-25 ENCOUNTER — Encounter
Admission: RE | Disposition: A | Discharge status: Home / Self Care | Payer: Self-pay | Source: Home / Self Care | Attending: Vascular Surgery

## 2023-12-25 DIAGNOSIS — D649 Anemia, unspecified: Secondary | ICD-10-CM | POA: Diagnosis not present

## 2023-12-25 DIAGNOSIS — S83221A Peripheral tear of medial meniscus, current injury, right knee, initial encounter: Secondary | ICD-10-CM

## 2023-12-25 DIAGNOSIS — T8249XA Other complication of vascular dialysis catheter, initial encounter: Secondary | ICD-10-CM | POA: Diagnosis not present

## 2023-12-25 DIAGNOSIS — Z452 Encounter for adjustment and management of vascular access device: Secondary | ICD-10-CM | POA: Diagnosis present

## 2023-12-25 DIAGNOSIS — I878 Other specified disorders of veins: Secondary | ICD-10-CM | POA: Diagnosis not present

## 2023-12-25 HISTORY — PX: UPPER EXTREMITY VENOGRAPHY: CATH118272

## 2023-12-25 LAB — GLUCOSE, CAPILLARY
Glucose-Capillary: 107 mg/dL — ABNORMAL HIGH (ref 70–99)
Glucose-Capillary: 112 mg/dL — ABNORMAL HIGH (ref 70–99)
Glucose-Capillary: 127 mg/dL — ABNORMAL HIGH (ref 70–99)

## 2023-12-25 SURGERY — UPPER EXTREMITY VENOGRAPHY
Anesthesia: Moderate Sedation | Laterality: Right

## 2023-12-25 MED ORDER — FENTANYL CITRATE (PF) 100 MCG/2ML IJ SOLN
INTRAMUSCULAR | Status: DC | PRN
  Administered 2023-12-25: 25 ug via INTRAVENOUS
  Administered 2023-12-25 (×2): 50 ug via INTRAVENOUS

## 2023-12-25 MED ORDER — IODIXANOL 320 MG/ML IV SOLN
INTRAVENOUS | Status: DC | PRN
  Administered 2023-12-25: 5 mL

## 2023-12-25 MED ORDER — FENTANYL CITRATE (PF) 100 MCG/2ML IJ SOLN
INTRAMUSCULAR | Status: AC
  Filled 2023-12-25: qty 2

## 2023-12-25 MED ORDER — DIPHENHYDRAMINE HCL 50 MG/ML IJ SOLN: 50.0000 mg | Freq: Once | INTRAMUSCULAR | Status: DC | PRN

## 2023-12-25 MED ORDER — HEPARIN (PORCINE) IN NACL 1000-0.9 UT/500ML-% IV SOLN
INTRAVENOUS | Status: DC | PRN
  Administered 2023-12-25: 500 mL

## 2023-12-25 MED ORDER — HEPARIN SOD (PORK) LOCK FLUSH 100 UNIT/ML IV SOLN
INTRAVENOUS | Status: AC
  Filled 2023-12-25: qty 5

## 2023-12-25 MED ORDER — METHYLPREDNISOLONE SODIUM SUCC 125 MG IJ SOLR: 125.0000 mg | Freq: Once | INTRAMUSCULAR | Status: DC | PRN

## 2023-12-25 MED ORDER — LIDOCAINE-EPINEPHRINE (PF) 1 %-1:200000 IJ SOLN
INTRAMUSCULAR | Status: DC | PRN
  Administered 2023-12-25: 20 mL via INTRADERMAL

## 2023-12-25 MED ORDER — CEFAZOLIN SODIUM-DEXTROSE 2-4 GM/100ML-% IV SOLN
2.0000 g | INTRAVENOUS | Status: AC
  Administered 2023-12-25: 2 g via INTRAVENOUS

## 2023-12-25 MED ORDER — SODIUM CHLORIDE 0.9 % IV SOLN
80.0000 mg | Freq: Once | INTRAVENOUS | Status: DC
  Filled 2023-12-25: qty 2

## 2023-12-25 MED ORDER — MIDAZOLAM HCL 2 MG/2ML IJ SOLN
INTRAMUSCULAR | Status: AC
Start: 2023-12-25 — End: 2023-12-25
  Filled 2023-12-25: qty 2

## 2023-12-25 MED ORDER — MIDAZOLAM HCL 2 MG/ML PO SYRP: 8.0000 mg | ORAL_SOLUTION | Freq: Once | ORAL | Status: DC | PRN

## 2023-12-25 MED ORDER — MIDAZOLAM HCL (PF) 2 MG/2ML IJ SOLN
INTRAMUSCULAR | Status: DC | PRN
  Administered 2023-12-25: 1 mg via INTRAVENOUS
  Administered 2023-12-25: .5 mg via INTRAVENOUS
  Administered 2023-12-25: 2 mg via INTRAVENOUS

## 2023-12-25 MED ORDER — MIDAZOLAM HCL 2 MG/2ML IJ SOLN
INTRAMUSCULAR | Status: AC
  Filled 2023-12-25: qty 2

## 2023-12-25 MED ORDER — SODIUM CHLORIDE 0.9 % IV SOLN: INTRAVENOUS | Status: DC

## 2023-12-25 MED ORDER — HEPARIN SODIUM (PORCINE) 1000 UNIT/ML IJ SOLN
INTRAMUSCULAR | Status: AC
  Filled 2023-12-25: qty 10

## 2023-12-25 MED ORDER — FAMOTIDINE 20 MG PO TABS: 40.0000 mg | ORAL_TABLET | Freq: Once | ORAL | Status: DC | PRN

## 2023-12-25 MED ORDER — CEFAZOLIN SODIUM-DEXTROSE 2-4 GM/100ML-% IV SOLN
INTRAVENOUS | Status: AC
  Filled 2023-12-25: qty 100

## 2023-12-25 SURGICAL SUPPLY — 1 items: PACK ANGIOGRAPHY (CUSTOM PROCEDURE TRAY) IMPLANT

## 2023-12-25 NOTE — Op Note (Signed)
 [facility]   OPERATIVE NOTE  DATE: 12/25/2023  PRE-OPERATIVE DIAGNOSIS: Poor venous access, anemia, difficulty with access for port  POST-OPERATIVE DIAGNOSIS: same as above  PROCEDURE: 1.   Contrast injection of Port-A-Cath with superior venacavogram  SURGEON: Porfiria Heinrich  ASSISTANT(S): None  ANESTHESIA: Local with moderate conscious sedation for approximately 25 using a total of 3.5 mg of Versed  and 125 mcg of Fentanyl   ESTIMATED BLOOD LOSS: Minimal  FINDING(S): 1.  Port was in excellent location without any signs of infection.  The port withdrew blood easily and flushed well.  Contrast injection showed the port to be widely patent with the catheter tip at the cavoatrial junction.  There was no significant central venous stenosis in the superior vena cava and there was brisk outflow through the right atrium and then the right ventricular outflow tract.  No issues with the port.   INDICATIONS:   Amber Christensen is a 58 y.o. female who presents with pain and difficulty with access of her Port-A-Cath.  We are asked to interrogate and evaluate the port.  Risks and benefits were discussed and informed consent was obtained.  DESCRIPTION: After obtaining full informed written consent, the patient was brought back to the operating room and placed supine upon the operating table.  The patient was prepped and draped in the standard fashion.  The port was then evaluated and accessed with a Huber needle.  Port was in excellent location without any signs of infection.  The port withdrew blood easily and flushed well.  Contrast injection showed the port to be widely patent with the catheter tip at the cavoatrial junction.  There was no significant central venous stenosis in the superior vena cava and there was brisk outflow through the right atrium and then the right ventricular outflow tract.  No issues with the port. At this point, we elected to complete the procedure.  The  port was filled with heparinized saline.  The Morven needle was removed.  The patient was taken to the recovery room in stable condition.   COMPLICATIONS: None  CONDITION: Stable  Amber Christensen  12/25/2023, 8:51 AM    This note was created with Dragon Medical transcription system. Any errors in dictation are purely unintentional.

## 2023-12-25 NOTE — OR Nursing (Signed)
 On insulin  pump. Turned off pre procedure BG 102

## 2023-12-25 NOTE — Discharge Instructions (Signed)
 No follow up appointment needed per Dr Marea

## 2023-12-25 NOTE — Interval H&P Note (Signed)
 History and Physical Interval Note:  12/25/2023 8:09 AM  Amber Christensen  has presented today for surgery, with the diagnosis of R Arm Venogram w possible port insertion    Poor venous access.  The various methods of treatment have been discussed with the patient and family. After consideration of risks, benefits and other options for treatment, the patient has consented to  Procedure(s): UPPER EXTREMITY VENOGRAPHY (Right) as a surgical intervention.  The patient's history has been reviewed, patient examined, no change in status, stable for surgery.  I have reviewed the patient's chart and labs.  Questions were answered to the patient's satisfaction.     Geraldine Sandberg

## 2024-01-16 ENCOUNTER — Ambulatory Visit: Admission: RE | Admit: 2024-01-16 | Source: Ambulatory Visit
